# Patient Record
Sex: Female | Born: 1955 | Race: White | Hispanic: No | Marital: Married | Smoking: Never smoker
Health system: Southern US, Community
[De-identification: ages and names within clinical notes are randomized; demographics above are authoritative.]

## PROBLEM LIST (undated history)

## (undated) DIAGNOSIS — Q2112 Patent foramen ovale: Secondary | ICD-10-CM

## (undated) DIAGNOSIS — I6529 Occlusion and stenosis of unspecified carotid artery: Secondary | ICD-10-CM

## (undated) DIAGNOSIS — R600 Localized edema: Secondary | ICD-10-CM

## (undated) DIAGNOSIS — N289 Disorder of kidney and ureter, unspecified: Secondary | ICD-10-CM

## (undated) DIAGNOSIS — I272 Pulmonary hypertension, unspecified: Secondary | ICD-10-CM

## (undated) DIAGNOSIS — J189 Pneumonia, unspecified organism: Secondary | ICD-10-CM

## (undated) DIAGNOSIS — I071 Rheumatic tricuspid insufficiency: Secondary | ICD-10-CM

## (undated) DIAGNOSIS — R911 Solitary pulmonary nodule: Secondary | ICD-10-CM

## (undated) DIAGNOSIS — E785 Hyperlipidemia, unspecified: Secondary | ICD-10-CM

## (undated) DIAGNOSIS — I10 Essential (primary) hypertension: Secondary | ICD-10-CM

## (undated) DIAGNOSIS — I340 Nonrheumatic mitral (valve) insufficiency: Secondary | ICD-10-CM

## (undated) DIAGNOSIS — Z5189 Encounter for other specified aftercare: Secondary | ICD-10-CM

## (undated) DIAGNOSIS — I509 Heart failure, unspecified: Secondary | ICD-10-CM

## (undated) DIAGNOSIS — Q211 Atrial septal defect: Secondary | ICD-10-CM

## (undated) DIAGNOSIS — D649 Anemia, unspecified: Secondary | ICD-10-CM

## (undated) DIAGNOSIS — E869 Volume depletion, unspecified: Secondary | ICD-10-CM

## (undated) DIAGNOSIS — I2510 Atherosclerotic heart disease of native coronary artery without angina pectoris: Secondary | ICD-10-CM

## (undated) DIAGNOSIS — R011 Cardiac murmur, unspecified: Secondary | ICD-10-CM

## (undated) HISTORY — DX: Atrial septal defect: Q21.1

## (undated) HISTORY — DX: Pneumonia, unspecified organism: J18.9

## (undated) HISTORY — DX: Anemia, unspecified: D64.9

## (undated) HISTORY — DX: Solitary pulmonary nodule: R91.1

## (undated) HISTORY — DX: Pulmonary hypertension, unspecified: I27.20

## (undated) HISTORY — DX: Patent foramen ovale: Q21.12

## (undated) HISTORY — DX: Nonrheumatic mitral (valve) insufficiency: I34.0

## (undated) HISTORY — DX: Volume depletion, unspecified: E86.9

## (undated) HISTORY — DX: Occlusion and stenosis of unspecified carotid artery: I65.29

## (undated) HISTORY — DX: Atherosclerotic heart disease of native coronary artery without angina pectoris: I25.10

## (undated) HISTORY — DX: Hyperlipidemia, unspecified: E78.5

## (undated) HISTORY — DX: Localized edema: R60.0

## (undated) HISTORY — DX: Cardiac murmur, unspecified: R01.1

## (undated) HISTORY — DX: Rheumatic tricuspid insufficiency: I07.1

## (undated) HISTORY — DX: Essential (primary) hypertension: I10

## (undated) HISTORY — DX: Heart failure, unspecified: I50.9

## (undated) HISTORY — DX: Encounter for other specified aftercare: Z51.89

---

## 2006-01-08 ENCOUNTER — Encounter (INDEPENDENT_AMBULATORY_CARE_PROVIDER_SITE_OTHER): Payer: Self-pay | Admitting: Interventional Cardiology

## 2006-01-08 ENCOUNTER — Inpatient Hospital Stay (HOSPITAL_COMMUNITY): Admission: EM | Admit: 2006-01-08 | Discharge: 2006-01-10 | Payer: Self-pay | Admitting: Emergency Medicine

## 2006-01-17 ENCOUNTER — Ambulatory Visit: Payer: Self-pay | Admitting: Cardiology

## 2006-01-20 ENCOUNTER — Inpatient Hospital Stay (HOSPITAL_COMMUNITY): Admission: EM | Admit: 2006-01-20 | Discharge: 2006-01-23 | Payer: Self-pay | Admitting: Emergency Medicine

## 2006-01-31 ENCOUNTER — Inpatient Hospital Stay (HOSPITAL_COMMUNITY): Admission: EM | Admit: 2006-01-31 | Discharge: 2006-02-02 | Payer: Self-pay | Admitting: Emergency Medicine

## 2006-03-03 ENCOUNTER — Emergency Department (HOSPITAL_COMMUNITY): Admission: EM | Admit: 2006-03-03 | Discharge: 2006-03-03 | Payer: Self-pay | Admitting: Emergency Medicine

## 2006-03-06 DIAGNOSIS — I6529 Occlusion and stenosis of unspecified carotid artery: Secondary | ICD-10-CM

## 2006-03-06 HISTORY — DX: Occlusion and stenosis of unspecified carotid artery: I65.29

## 2006-03-06 HISTORY — PX: CARDIAC CATHETERIZATION: SHX172

## 2006-03-27 ENCOUNTER — Ambulatory Visit (HOSPITAL_COMMUNITY): Admission: RE | Admit: 2006-03-27 | Discharge: 2006-03-27 | Payer: Self-pay | Admitting: Gastroenterology

## 2006-05-10 ENCOUNTER — Encounter (HOSPITAL_COMMUNITY): Admission: RE | Admit: 2006-05-10 | Discharge: 2006-05-11 | Payer: Self-pay | Admitting: Gastroenterology

## 2006-05-13 ENCOUNTER — Emergency Department (HOSPITAL_COMMUNITY): Admission: EM | Admit: 2006-05-13 | Discharge: 2006-05-14 | Payer: Self-pay | Admitting: Emergency Medicine

## 2006-05-15 ENCOUNTER — Ambulatory Visit (HOSPITAL_COMMUNITY): Admission: RE | Admit: 2006-05-15 | Discharge: 2006-05-15 | Payer: Self-pay | Admitting: Gastroenterology

## 2006-05-15 ENCOUNTER — Encounter (INDEPENDENT_AMBULATORY_CARE_PROVIDER_SITE_OTHER): Payer: Self-pay | Admitting: Specialist

## 2006-05-17 ENCOUNTER — Emergency Department (HOSPITAL_COMMUNITY): Admission: EM | Admit: 2006-05-17 | Discharge: 2006-05-17 | Payer: Self-pay | Admitting: Emergency Medicine

## 2006-05-21 ENCOUNTER — Emergency Department (HOSPITAL_COMMUNITY): Admission: EM | Admit: 2006-05-21 | Discharge: 2006-05-21 | Payer: Self-pay | Admitting: Emergency Medicine

## 2006-05-30 ENCOUNTER — Ambulatory Visit: Payer: Self-pay | Admitting: Internal Medicine

## 2006-05-30 ENCOUNTER — Inpatient Hospital Stay (HOSPITAL_COMMUNITY): Admission: EM | Admit: 2006-05-30 | Discharge: 2006-06-01 | Payer: Self-pay | Admitting: Emergency Medicine

## 2006-05-31 ENCOUNTER — Encounter: Payer: Self-pay | Admitting: Cardiology

## 2006-06-14 ENCOUNTER — Ambulatory Visit: Payer: Self-pay | Admitting: Cardiology

## 2006-06-22 ENCOUNTER — Ambulatory Visit: Payer: Self-pay | Admitting: Cardiology

## 2006-07-20 ENCOUNTER — Emergency Department (HOSPITAL_COMMUNITY): Admission: EM | Admit: 2006-07-20 | Discharge: 2006-07-21 | Payer: Self-pay | Admitting: Emergency Medicine

## 2006-08-10 ENCOUNTER — Ambulatory Visit (HOSPITAL_COMMUNITY): Admission: RE | Admit: 2006-08-10 | Discharge: 2006-08-10 | Payer: Self-pay | Admitting: Surgery

## 2006-08-10 ENCOUNTER — Encounter (INDEPENDENT_AMBULATORY_CARE_PROVIDER_SITE_OTHER): Payer: Self-pay | Admitting: Surgery

## 2006-09-26 ENCOUNTER — Ambulatory Visit: Payer: Self-pay | Admitting: Internal Medicine

## 2006-12-24 ENCOUNTER — Ambulatory Visit: Payer: Self-pay | Admitting: Cardiology

## 2006-12-31 ENCOUNTER — Ambulatory Visit: Payer: Self-pay | Admitting: Cardiology

## 2007-01-29 ENCOUNTER — Ambulatory Visit: Payer: Self-pay | Admitting: Cardiology

## 2007-03-12 ENCOUNTER — Ambulatory Visit: Payer: Self-pay | Admitting: Cardiology

## 2007-09-02 ENCOUNTER — Ambulatory Visit: Payer: Self-pay | Admitting: Cardiology

## 2008-04-03 ENCOUNTER — Ambulatory Visit: Payer: Self-pay | Admitting: Cardiology

## 2008-09-03 ENCOUNTER — Ambulatory Visit: Payer: Self-pay | Admitting: *Deleted

## 2008-09-15 ENCOUNTER — Ambulatory Visit: Payer: Self-pay | Admitting: Vascular Surgery

## 2008-09-15 ENCOUNTER — Ambulatory Visit (HOSPITAL_COMMUNITY): Admission: RE | Admit: 2008-09-15 | Discharge: 2008-09-15 | Payer: Self-pay | Admitting: Vascular Surgery

## 2008-09-24 ENCOUNTER — Ambulatory Visit (HOSPITAL_COMMUNITY): Admission: RE | Admit: 2008-09-24 | Discharge: 2008-09-24 | Payer: Self-pay | Admitting: Surgery

## 2008-10-23 ENCOUNTER — Ambulatory Visit (HOSPITAL_COMMUNITY): Admission: RE | Admit: 2008-10-23 | Discharge: 2008-10-23 | Payer: Self-pay | Admitting: Vascular Surgery

## 2008-10-23 ENCOUNTER — Ambulatory Visit: Payer: Self-pay | Admitting: Vascular Surgery

## 2008-11-05 ENCOUNTER — Encounter: Payer: Self-pay | Admitting: Cardiology

## 2009-02-23 HISTORY — PX: KIDNEY TRANSPLANT: SHX239

## 2009-03-30 ENCOUNTER — Ambulatory Visit (HOSPITAL_COMMUNITY): Admission: RE | Admit: 2009-03-30 | Discharge: 2009-03-30 | Payer: Self-pay | Admitting: Nephrology

## 2009-10-12 ENCOUNTER — Ambulatory Visit (HOSPITAL_COMMUNITY)
Admission: RE | Admit: 2009-10-12 | Discharge: 2009-10-12 | Payer: Self-pay | Source: Home / Self Care | Admitting: Nephrology

## 2009-11-11 ENCOUNTER — Ambulatory Visit (HOSPITAL_COMMUNITY): Admission: RE | Admit: 2009-11-11 | Discharge: 2009-11-11 | Payer: Self-pay | Admitting: Nephrology

## 2010-06-12 LAB — GLUCOSE, CAPILLARY
Glucose-Capillary: 156 mg/dL — ABNORMAL HIGH (ref 70–99)
Glucose-Capillary: 172 mg/dL — ABNORMAL HIGH (ref 70–99)
Glucose-Capillary: 224 mg/dL — ABNORMAL HIGH (ref 70–99)

## 2010-06-12 LAB — POCT I-STAT 4, (NA,K, GLUC, HGB,HCT)
Hemoglobin: 12.9 g/dL (ref 12.0–15.0)
Sodium: 135 mEq/L (ref 135–145)
Sodium: 134 mEq/L — ABNORMAL LOW (ref 135–145)

## 2010-07-19 NOTE — Op Note (Signed)
[name], [name]                ACCOUNT NO.:  [id number]   MEDICAL RECORD NO.:  [id number]          PATIENT TYPE:  AMB   LOCATION:  SDS                          FACILITY:  [facility]   PHYSICIAN:  Imogene Burn. Georgette Dover, M.D. DATE OF BIRTH:  [birth year]   DATE OF PROCEDURE:  08/10/2006  DATE OF DISCHARGE:  08/10/2006                               OPERATIVE REPORT   PREOPERATIVE DIAGNOSIS:  Biliary dyskinesia.   POSTOPERATIVE DIAGNOSIS:  Biliary dyskinesia.   PROCEDURE PERFORMED:  Laparoscopic cholecystectomy with intraoperative  cholangiogram.   SURGEON:  Imogene Burn. Tsuei, M.D.   ANESTHESIA:  General endotracheal.   INDICATIONS:  The patient is a 55 year old female who has been  thoroughly evaluated by gastroenterology for a month history of  abdominal pain and nausea.  She has also had some weight loss.  Her pain  occurs after eating, especially greasy food.  Ultrasound was negative.  A HIDA scan showed a gallbladder ejection fraction at 18%.  She presents  for elective cholecystectomy in hopes of relieving her symptoms.   DESCRIPTION OF PROCEDURE:  The patient was brought to the operating room  and placed in the supine position on the operating table.  After an  adequate level of general anesthesia was obtained, the patient's abdomen  was prepped with Betadine and draped in a sterile fashion.  A time-out  was taken to assure the proper patient and proper procedure.  A  transverse incision was made below her umbilicus.  The decision was made  not to do a vertical incision deep into the umbilicus since there was  some necrotic debris which had been trapped in the umbilicus.  The  transverse incision was opened.  Dissection was carried down to the  fascia.  The fascia was grasped between clamps and opened.  A stay  suture of 0 Vicryl was placed around the fascial opening.  The Hasson  cannula was inserted and secured with a stay suture.  The  pneumoperitoneum is obtained by  insufflating CO2 maintaining a maximal  pressure of 15 mmHg.  The laparoscope was inserted and the patient was  positioned in reversed Trendelenburg position and tilted to her left.   There were omental adhesions to the surface of the gallbladder.  A 10 mm  port was placed in the subxiphoid position and two 5 mm ports were  placed in the right upper quadrant.  The gallbladder was grasped with a  clamp and elevated over the edge of the liver.  Cautery was used take  down the omental adhesions.  The peritoneum around the hilum of the  gallbladder was opened.  The cystic duct was circumferentially  dissected, ligated, and clipped distally.  An opening was created on the  cystic duct.  A Cook cholangiogram catheter was then inserted through a  stab incision and threaded into the cystic duct.  A cholangiogram was  obtained.  This showed good flow proximally and distally in the biliary  tree.  No sign of obstruction.  Contrast flowed easily through the  duodenum.  The cystic duct was very long and inserted into  the common  duct at some distance from the normal position.  The cystic duct was  then ligated with clips and divided.  The cystic artery was also ligated  clips and divided.  Cautery was used to remove the gallbladder from the  liver bed.  The gallbladder was placed in an EndoCatch sac.  The  gallbladder was removed umbilical port site.   The fascia was closed with the pursestring suture.  We irrigated the  right upper quadrant and checked for hemostasis.  The irrigation was  suctioned out.  The pneumoperitoneum was then released as trocars were  removed.  4-0 Monocryl was used to close the skin incisions.  Steri-Strips and  clean dressings were applied.  The patient was then extubated and  brought to the recovery room in stable condition.  All sponge,  instrument, and needle counts were correct.      Imogene Burn. Tsuei, M.D.  Electronically Signed     MKT/MEDQ  D:  08/10/2006   T:  08/10/2006  Job:  [id number]   cc:   Tory Emerald. Benson Norway, MD

## 2010-07-19 NOTE — Assessment & Plan Note (Signed)
[place] OFFICE NOTE   WILNA, STGELAIS                         MRN:          [id number]  DATE:09/02/2007                            DOB:          Aug 31, 1955    CARDIOLOGIST:  Ernestine Mcmurray, MD, [facility]   PRIMARY CARE PHYSICIAN:  Tammy R. Modena Morrow, MD   REASON FOR VISIT:  Four-month followup.   HISTORY OF PRESENT ILLNESS:  [name] is a 55 year old female patient  with a history of nonobstructive coronary artery disease by cardiac  catheterization in March 2008, who returns to the office today for  followup.  She has been followed quite closely by Mr. Mannie Stabile, PA-C,  over the last several months.  She was noted to have lower extremity  edema and a low albumin on lab work.  A 24-hour urine eventually  revealed greater than 4 grams of protein loss per day.  The patient was  referred to Dr. Lowanda Foster for potential nephrotic syndrome.  She returns  to [place] today for followup.  Overall, she is doing well.  She denies any  significant changes in shortness of breath.  She sleeps on 2 pillows.  There is no change.  She denies PND.  She denies any chest discomfort.  She denies syncope or near syncope or palpitations.  She notes that her  lower extremity edema has almost completely resolved.   She had recent blood work performed that revealed a BUN of 47 and a  creatinine of 1.9.  This was up from 11 and 1.3 respectively in January  2009.  She was asked to change her Demadex to every other day and to  follow up with BMET today.  This revealed stable BUN at 45 and stable  creatinine at 1.9.  Her potassium is within normal limits at 5.  Of  note, her recent blood work revealed a hemoglobin of 9.8, which was  stable.  She has a history of anemia of chronic disease.   CURRENT MEDICATIONS:  1. Iron 65 mg daily.  2. Demadex 10 mg every other day.  3. Lisinopril 20 mg daily.  4. Hyzaar 100/25 mg daily.  5. Lantus  insulin 20 units daily.  6. Lopressor 50 mg b.i.d.  7. Glimepiride 4 mg daily.  8. Sliding-scale insulin.  9. Aspirin 81 mg daily.  10.Simvastatin 40 mg daily.  11.Fluoxetine 20 mg daily.   PHYSICAL EXAMINATION:  GENERAL:  She is a well-nourished and well-  nourished female, in no distress.  VITAL SIGNS:  Blood pressure is 109/59, pulse is 53, and weight is 170.8  pounds.  HEENT:  Normal.  NECK:  Without JVD.  CARDIAC:  Normal S1 and S2.  Regular rate and rhythm.  I cannot  appreciate any murmurs.  LUNGS:  Clear to auscultation bilaterally.  ABDOMEN:  Nontender.  EXTREMITIES:  Without edema.  NEUROLOGIC:  She is alert and oriented x3.  Cranial II-XII grossly  intact.  VASCULAR:  Without carotid bruits bilaterally.   IMPRESSION:  1. Lower extremity edema with hypoalbuminemia and profound  proteinuria.      a.     Rule out nephrotic syndrome - followed by Dr. Lowanda Foster.  2. Renal deficiency.  3. Nonobstructive coronary artery disease by catheterization in March      2008.      a.     LAD proximal 40%, D1 25-40%, small obtuse marginal 90%,       second PDA branch 70% - medical therapy.  4. Preserved left ventricular function.  5. Moderate mitral regurgitation.  6. Moderate tricuspid regurgitation.  7. Moderate pulmonary hypertension.  8. Anemia of chronic disease.  9. Diabetes mellitus.  10.Dyslipidemia.  11.History of small patent foramen ovale.  12.History of pulmonary nodules, evaluated by Dr. Melvyn Novas.      a.     The patient is to have followup with chest x-ray per Dr.       Gustavus Bryant instructions.  13.History of nonobstructive cerebrovascular disease.      a.     Less than 50% bilateral internal carotid artery stenosis in       July 2008.   PLAN:  1. Ms. Bromberg is doing well from a cardiovascular standpoint.  No      further cardiovascular testing is warranted at this time.  She      should probably have another echocardiogram within the next 6-12      months.  She  should have followup carotid Dopplers in 2010.  2. She continues to follow with Dr. Lowanda Foster.  Of note, her creatinine      has gone up over the last several months.  We will send copies of      her labs to Dr. Lowanda Foster.  I have asked that she follow up with him      to further manage this.  I do not want to adjust any of her      medications that are being used to treat her potential nephrotic      syndrome.  3. She will have followup lipids, LFTs, and A BMET performed in 1      month's time.  4. We will set her up for a routine chest x-ray to assess stability of      her pulmonary nodules.  5. The patient will be brought back in followup in the next 6 months      or sooner p.r.n.      Richardson Dopp, PA-C  Electronically Signed      Ernestine Mcmurray, MD,FACC  Electronically Signed   SW/MedQ  DD: 09/02/2007  DT: 09/03/2007  Job #: [id number]   cc:   Tammy R. Modena Morrow, M.D.

## 2010-07-19 NOTE — Assessment & Plan Note (Signed)
[place] OFFICE NOTE   KANE, HARMISON                         MRN:          [id number]  DATE:12/24/2006                            DOB:          [birth year]    PRIMARY CARDIOLOGIST:  Dr. Terald Sleeper.   REASON FOR VISIT:  Scheduled 55-month followup.   Ms. Dougall reports no interim development of exertional angina pectoris  since last seen here in the clinic, by Dr. Dannielle Burn, in April.  She has  nonobstructive coronary artery disease by cardiac catheterization in  March of this year, essentially revealing small branch vessel disease in  the setting of known diabetes mellitus.  Left ventricular function is  normal and, of note, there was no evidence of mitral regurgitation by  cardiac catheterization.   Since her last visit, the patient has had followup with Dr. Christinia Gully  in [place], in followup of a CT scan of the chest this past June  which was notable for 2 small pulmonary nodules.  Dr. Melvyn Novas thought that  this was a very soft call, with the suggestion of 1 having grown  slightly in the preceding 6 months.  The patient has no history of  tobacco smoking, but did live with a smoker for 20 years.  Dr. Melvyn Novas  recommended continued monitoring with serial x-rays, rather than by CT  scans.  She is also scheduled to follow up with him in the next several  weeks.   Although the patient denies any frank PND or orthopnea, she complains of  new-onset lower extremity edema in both legs.  As noted, she has normal  left ventricular function and no evidence of right ventricular  dysfunction by previous echocardiograms.  An incidental small patent  foramen ovale was noted by echo in April.  There was no significant  valvular disease save for some trivial aortic regurgitation.   Review of most recent lab data, notable for hypoalbuminemia/proteinemia  by blood work in April.  She has normal renal function, and I do  not  have a urinalysis so as to exclude proteinuria.  TSH was normal in  March.   CURRENT MEDICATIONS:  1. Lopressor 50 b.i.d.  2. Glimepiride 4 daily.  3. Lantus 26 units daily.  4. Aspirin 81 daily.  5. Norvasc 5 daily.  6. Simvastatin 40 daily.  7. Fluoxetine 20 daily.  8. Lisinopril 10 daily.  9. Furosemide 40 daily.  10.Iron 65 daily.  11.Domperidone 10 q.i.d.   PHYSICAL EXAMINATION:  Blood pressure 125/60, pulse 65, regular, weight  173.   Electrocardiogram today reveals normal sinus rhythm at 63 beats per  minute with normal axis and chronic anteroseptal changes.   GENERAL:  A 55 year old female, pallid appearing, sitting upright and in  no distress.  HEENT:  Normocephalic, atraumatic.  NECK:  Palpable bilateral carotid pulses with bilateral bruits (right  greater than left), no JVD.  LUNGS:  Clear to auscultation all fields.  HEART:  Regular rate and rhythm (S1, S2), a 2/6 systolic ejection murmur  heard loudest at the base.  ABDOMEN:  Benign.  EXTREMITIES:  2-3+ bilateral, pitting edema.  NEURO:  No focal deficit.   IMPRESSION:  1. Bilateral lower extremity edema.      a.     Question new onset.      b.     Question secondary to hypo-oncotic pressure.      c.     Normal right ventricular function by 2D echo, March 2008.  2. Nonobstructive coronary artery disease.      a.     Small vessel disease by cardiac catheterization, March 2008.      b.     Normal left ventricular function.  3. Insulin-dependent diabetes mellitus, diabetic gastroparesis.  4. Pulmonary nodules, followed by Dr. Christinia Gully.  5. Dyslipidemia.  6. Anemia of chronic disease on supplemental iron.  7. Hypoalbuminemia.  8. Nonobstructive cerebrovascular disease.      a.     Carotid Dopplers, July 2008.      b.     Small patent foramen ovale.   PLAN:  1. Increase Lasix to 80 mg daily x1 week, then resume 40 mg daily for      short-term aggressive management of lower extremity edema.  2.  Check complete metabolic profile, BNP level and urinalysis today.      We will closely monitor electrolytes, renal function, and also      reassess for hypoalbuminemia and possible proteinuria, in light of      the patient's lower extremity edema and history of low albumin.  3. Repeat a BMET in 1 week for monitoring of electrolytes and renal      function after aggressive diuretic treatment for lower extremity      edema.  4. Repeat 2D echocardiogram for reassessment of left and right      ventricular function, and reassessment of valvular heart disease      given the finding of systolic murmur on physical examination.  5. Schedule return clinic followup with myself and Dr. Dannielle Burn in 1      month for review of study results and further recommendations.  6. The patient is instructed to contact Dr. Saunders Glance office so      as to arrange recommended clinic followup in the next several      weeks.      Gene Serpe, PA-C  Electronically Signed      Ernestine Mcmurray, MD,FACC  Electronically Signed   GS/MedQ  DD: 12/24/2006  DT: 12/25/2006  Job #: [id number]   cc:   Tammy R. Modena Morrow, M.D.

## 2010-07-19 NOTE — Assessment & Plan Note (Signed)
[place] OFFICE NOTE   [name], [name]                         MRN:          [id number]  DATE:03/12/2007                            DOB:          Dec 29, 1955    PRIMARY CARDIOLOGIST:  Dr. Terald Sleeper.   REASON FOR VISIT:  Scheduled followup.  Please refer to my previous  office note of November 25, for full details.   Since last seen in the clinic, I referred [name] to Dr. Lowanda Blackwell for  further evaluation of hypoalbuminemia and proteinuria. I noted that the  patient had had persistently low serum albumin levels by previous blood  work, as well as evidence of proteinuria by previous urinalysis.  I ,  thus, ordered a 24 hour urine and this yielded 4.8 grams of protein.  Given this finding, I referred the patient to Dr. Lowanda Blackwell, who saw the  patient earlier this morning.  He reportedly is repeating a 24-hour  urine and has also ordered a renal ultrasound.   Clinically, the patient reports no significant change since her last  office visit: her lower extremity edema has remained stable and her  weight is unchanged.  When last seen, I substituted her Lasix with  Demadex 10 mg daily, for more aggressive diuretic management.  Followup  blood work showed stable renal function.   The patient continues to report significant lower extremity edema and  only some mild exertional dyspnea on occasion.  She denies any frank  orthopnea or PND, but continues to sleep on 2 pillows.   The patient has normal ventricular function by previous studies,  including a heart catheterization in March 2008, and, more recently, a 2-  D echocardiogram this past October.  Of note, however, echocardiography  did yield moderate mitral regurgitation and moderate pulmonary  hypertension.   CURRENT MEDICATIONS:  1. Demadex 73.  2. Avelox of 14 mg daily as directed.  3. Iron 65 mg daily.  4. Domperidone 10 mg q.i.d.  5. Lopressor  50 mg b.i.d.  6. Glimepiride 4 mg daily.  7. Lantus 26 units nightly.  8. Sliding scale insulin.  9. Aspirin 81 mg daily.  10.Simvastatin 40 mg daily.  11.Fluoxetine 20 mg daily.  12.Lisinopril 20 mg daily.   PHYSICAL EXAMINATION:  Blood pressure 140/69, pulse 60, regular weight  177.8 (unchanged).   IMPRESSION:  1. Persistent lower extremity edema.      a.     Secondary to hypo-oncotic pressure secondary to       hypoalbuminemia.      b.     Rule out nephrotic syndrome.  2. Nonobstructive coronary artery disease.      a.     Cardiac catheterization, March 2008.  3. Preserved left ventricular function.  4. Valvular heart disease.      a.     Moderate mitral regurgitation, moderate tricuspid       regurgitation.  5. Moderate pulmonary hypertension.  6. Anemia of chronic disease.  7. Insulin-dependent diabetes mellitus.  8. Dyslipidemia.  9. Small, patent foramen ovale.  10.History of small pulmonary  nodule.      a.     Followed by Dr. Christinia Blackwell, in [place].  11.Nonobstructive cerebrovascular disease.   PLAN:  1. Continue current medication regimen.  2. No further cardiac workup indicated at this time, with the patient      undergoing current evaluation for possible nephrotic syndrome.  3. Schedule return clinic followup with myself and Dr. Dannielle Blackwell in 4      months.      Gene Serpe, PA-C  Electronically Signed      Ernestine Mcmurray, MD,FACC  Electronically Signed   GS/MedQ  DD: 03/12/2007  DT: 03/12/2007  Job #: [id number]   cc:   [name], M.D.  [name], M.D.

## 2010-07-19 NOTE — Op Note (Signed)
NAMEESLIE, EBBS                ACCOUNT NO.:  [id number]   MEDICAL RECORD NO.:  [id number]          PATIENT TYPE:  AMB   LOCATION:  SDS                          FACILITY:  [facility]   PHYSICIAN:  Rosetta Posner, M.D.    DATE OF BIRTH:  07-31-1955   DATE OF PROCEDURE:  09/15/2008  DATE OF DISCHARGE:  09/15/2008                               OPERATIVE REPORT   PREOPERATIVE DIAGNOSIS:  Chronic renal insufficiency.   POSTOPERATIVE DIAGNOSIS:  Chronic renal insufficiency.   PROCEDURE:  Right forearm loop arteriovenous Gore-Tex graft.   SURGEON:  Rosetta Posner, MD   ASSISTANT:  Jacinta Shoe, PA-C   ANESTHESIA:  MAC.   COMPLICATIONS:  None.   DISPOSITION:  Recovery room, stable.   PROCEDURE IN DETAIL:  The patient was taken to the operating room and  placed in supine position where the right arm was prepped and draped in  the usual sterile fashion.  Ultrasound imaging revealed a very small  cephalic vein, but a moderate-sized basilic vein at the level of the  elbow.  Using local anesthesia, an incision was made over the  antecubital space and carried down to isolate the brachial artery which  was of moderate size and the basilic vein was also good size.  Separate  incision was made over the distal forearm and a loop configuration  tunnel was created in the forearm.  A 6-mm standard wall stretch graft  was brought through the tunnel.  The basilic vein was occluded  proximally and distally with an 11 blade extending longitudinally using  Potts scissors.  A 4 dilator passed without resistance through the vein.  The graft spatulated and sewn end to side to the basilic vein with a  running 6-0 Prolene suture.  Clamps were removed.  The graft was flushed  with heparinized saline and reoccluded.  Next, the brachial artery was  occluded proximally and distally and was opened with 11 blade extending  longitudinally using Potts scissors.  A small arteriotomy was created.  The graft was  cut to the appropriate length and was sewn end to side to  the artery with a running 6-0 Prolene suture.  Clamps were removed and  good thrill was noted.  The wound was irrigated with saline.  Hemostasis  was obtained with electrocautery.  The wound was closed with 3-0 Vicryl  in the subcutaneous and subcuticular tissue.  Benzoin and Steri-Strips  were applied.      Rosetta Posner, M.D.  Electronically Signed     TFE/MEDQ  D:  09/15/2008  T:  09/16/2008  Job:  [id number]

## 2010-07-19 NOTE — Assessment & Plan Note (Signed)
[place] OFFICE NOTE   [name], [name]                       MRN:          [id number]  DATE:04/03/2008                            DOB:          [birth year]    REFERRING PHYSICIAN:  Tammy R. Modena Morrow, M.D.   HISTORY OF PRESENT ILLNESS:  The patient is a 55 year old female with  history of nonobstructive coronary arteries by catheterization in 2008.  The patient has a history now of nephrotic syndrome that is followed by  Dr. Lowanda Foster.  She was recently hospitalized due to marked dehydration.  The patient states at times she feels very nauseated.  She feels her  breathing now is okay.  She is very emotional at times because her  daughter is in jail for drug abuse.  She has frequent panic attacks.  She gets short of breath on minimal exertion.   She is also raising 2 grandchildren from her daughter.  The patient has  a history of moderate mitral regurgitation, moderate pulmonary  hypertension.  She has been evaluated for anemia with an EGD and  colonoscopy in 2009 and they were within normal limits.  She denies any  chest pain, orthopnea, or PND.  Her creatinine in the end of December  was 1.9.   MEDICATIONS:  1. Hyzaar 100/25 daily.  2. Metoprolol tartrate 50 mg p.o. b.i.d.  3. Lisinopril 20 mg p.o. q.a.m.  4. Torsemide 10 mg q.a.m.  5. Clonidine 0.2 mg p.o. b.i.d.  6. Fluoxetine daily.  7. Simvastatin 40 mg nightly.  8. Iron 65 mg 2 tablets q.a.m.  9. Aspirin 81 mg q.a.m.   PHYSICAL EXAMINATION:  VITAL SIGNS:  Blood pressure 152/69, heart rate  52, weight 776 pounds.  NECK:  Normal carotid stroke.  No carotid bruits.  LUNGS:  Clear breath sounds bilaterally.  HEART:  Regular rate and rhythm with a 2/6 holosystolic murmur.  ABDOMEN:  Soft, nontender.  EXTREMITIES:  No significant edema.   PROBLEM LIST:  1. Nephrotic syndrome.  Cause unknown.  Followed by Dr. Lowanda Foster.  2. Renal insufficiency  with creatinine of 1.9.  3. Nonobstructive coronary artery disease.  4. Preserved left ventricular function.  5. Moderate mitral regurgitation and moderate tricuspid regurgitation      as well as pulmonary hypertension.  6. Anemia of chronic disease.  7. Diabetes mellitus.  8. Dyslipidemia.  9. History of small patent foramen ovale.  10.History of pulmonary nodules evaluated by Dr. Melvyn Novas.   PLAN:  1. As the patient is markedly fatigued and she looks very pale, I have      ordered a CBC as well as a retic count.  I did not reorder ANA,      rheumatoid factor, C-ANCA which was done before by Dr. Lowanda Foster.  2. The patient did have a history of significant mitral regurgitation.      We will repeat an echo to follow up on this.  3. We will not make any changes in her medical therapy given the fact      that Dr. Lowanda Foster is following this closely.  I did order serum      immunofixation and a serum free light chain assay.      Ernestine Mcmurray, MD,FACC  Electronically Signed    GED/MedQ  DD: 04/09/2008  DT: 04/10/2008  Job #: [id number]   cc:   Tammy R. Modena Morrow, M.D.

## 2010-07-19 NOTE — Procedures (Signed)
CEPHALIC VEIN MAPPING   INDICATION:  Preop evaluation.   HISTORY:  Chronic renal failure.   EXAM:   The right cephalic vein was not adequately visualized.   The right basilic vein is compressible with diameter measurements  ranging from 0.13 to 0.31 cm.   The left cephalic vein was not adequately visualized.   The left basilic vein is compressible with diameter measurements ranging  from 0.11 to 0.33 cm.   IMPRESSION:  1. Patent bilateral basilic veins with diameter measurements as      described above.  2. Unable to adequately visualize the bilateral cephalic veins.   ___________________________________________  P. Drucie Opitz, M.D.   CH/MEDQ  D:  09/03/2008  T:  09/03/2008  Job:  [id number]

## 2010-07-19 NOTE — Assessment & Plan Note (Signed)
[place]                             PULMONARY OFFICE NOTE   NAME:[name], [name]                         MRN:          [id number]  DATE:09/26/2006                            DOB:          [birth year]    REASON FOR CONSULTATION:  Pulmonary nodule.   HISTORY:  This is a 55 year old white female, never smoker, diabetic,  with small vessel coronary artery disease and nonobstructive  cardiomyopathy with incidental finding of a multiple nodules in April of  2007 (not clear whether this was the CT scan or a plain film).  She was  seen and sent now because one of the nodules seen originally seen in  10/07 looks slightly larger, but is still only 9 x 6 mm, and is in the  lateral segment of the right middle lobe.   She denies any pleuritic pain.  She had unintended weight loss since  October associated with anorexia that has improved dramatically since  cholecystectomy in June of this year.  She denies, presently, any  dyspnea, orthopnea, PND, leg swelling, pleuritic pain or hemoptysis.   PAST MEDICAL HISTORY:  Significant for diabetes and hypertension.   ALLERGIES:  None known.   MEDICATIONS:  Recorded in detail on the worksheet, correct as listed.   SOCIAL HISTORY:  She is a never smoker, but did live with a smoker for  20 years.   FAMILY HISTORY:  Negative for respiratory disease or lung cancer.   REVIEW OF SYSTEMS:  Taken in detail and negative.   PHYSICAL EXAMINATION:  This is a 55 white female who tends to let  her husband answer questions for her.  She is afebrile with stable vital signs.  HEENT:  Unremarkable.  Oropharynx clear.  Lung fields are clear bilaterally to auscultation and percussion.  HEART:  Reveals a regular rate and rhythm without murmurs, gallops, or  rubs.  ABDOMEN:  Soft, benign.  EXTREMITIES:  Warm without calf tenderness, cyanosis, clubbing, or  edema.   CT scan was reviewed from September 04, 2006 with the above  findings.   IMPRESSION:  Multiple pulmonary nodules, one of which appears to have  grown slightly over the last 6 months.  This is a very soft call, and  these still are very tiny in a never smoker.  Since the nodules are  multiple in nature, my tendency is to follow them by plain film rather  than by CT scan.  If one of them can clearly be seen on plain film now  and could not be seen previously, I would proceed directly to an  excisional biopsy rather than go through PET scan or attempt a  percutaneous biopsy, and explained this to patient and her husband in  detail.   I have, therefore, arranged to see her back in 6 weeks and we will  establish serial followup in this office.     Christena Deem. Melvyn Novas, MD, [facility]  Electronically Signed    MBW/MedQ  DD: 09/26/2006  DT: 09/26/2006  Job #: [id number]   cc:   Ernestine Mcmurray, MD,FACC  Tammy R.  Modena Morrow, M.D.

## 2010-07-19 NOTE — Assessment & Plan Note (Signed)
[place] OFFICE NOTE   ZYKERRIA, GIBILISCO                         MRN:          [id number]  DATE:01/29/2007                            DOB:          [birth year]    PRIMARY CARDIOLOGIST:  Dr. Terald Sleeper.   REASON FOR VISIT:  Scheduled one month followup.  Please refer to my  office note of October 20, for full details.   At that time, I referred her for a repeat 2D echocardiogram for  reassessment of left ventricular function, given her new onset lower  extremity edema.  The echocardiogram suggested preserved left  ventricular function (EF 50% - 55%) with mild, concentric LVH.  Right  ventricular function was completely within normal limits.  There was  moderate mitral regurgitation, moderate tricuspid regurgitation, and  moderate pulmonary hypertension (RVSP 45-50).   I also recommended increasing Lasix from 40 to 80 daily x1 week for  aggressive management of lower extremity edema.  She informs me today  that this was not very effective, however.  We checked extensive blood  work, which revealed some mild prerenal azotemia with a stable  creatinine of 1.2, but a BUN of 39.  Potassium also was elevated at 5.6,  and I discontinued the supplemental potassium.  Followup potassium level  was 5.1.  Urinalysis was suggestive of possible UTI with large bacteria,  but negative nitrites and leukocyte esterase.  There was greater than  300 protein.  Urine culture was negative.  BNP level was drawn and was  425, compared to a previous level of 389 this past April.   Was notable was an albumin level of 2.9, which compared to 2.7 when last  checked in April of this year.   Clinically, Ms. Shutter continues to report no chest pain, PND but does  report wheezing if she lies flat on her back in bed.  She does sleep  on 2 pillows.  Her weight is also up 4 pounds since last visit.   CURRENT MEDICATIONS:  1. Norvasc 5  daily.  2. Aspirin 81 daily.  3. Lantus 26 units nightly.  4. Glimepiride 4 daily.  5. Lopressor 50 b.i.d.  6. Simvastatin 40 daily.  7. Fluoxetine 20 daily.  8. Lisinopril 10 daily.  9. Furosemide 40 daily.  10.Iron 65 daily.  11.Domperidone 10 mg q.i.d.   PHYSICAL EXAMINATION:  Blood pressure 140/60, pulse 57 and regular,  weight 177.2 (up 4).  GENERAL:  A 55 year old female sitting upright in no distress.  HEENT:  Normocephalic/atraumatic.  NECK:  Palpable bilateral carotid pulses with bilateral bruits (right  greater than left); no JVD.  LUNGS:  Clear to auscultation all fields.  HEART:  Regular rate and rhythm (S1-S2), a 99991111 holosystolic murmur  heard loudest at the base.  ABDOMEN:  Benign.  EXTREMITIES:  With 2 to 3+ bilateral, pitting edema.  NEURO:  Flat affect, but no focal deficit.   IMPRESSION:  1. Persistent lower extremity edema.      a.     Suspect secondary to hypo-oncotic pressure, secondary to  hypoalbuminemia.      b.     Normal left ventricular function, by current 2D echo.  2. Nonobstructive coronary artery disease.      a.     Cardiac catheterization, March 2008.  3. Anemia of chronic disease.  4. Small patent foramen ovale.  5. Insulin-dependent diabetes mellitus.  6. Small pulmonary nodule.      a.     Followed by Dr. Christinia Gully, in [place].  7. Dyslipidemia.  8. Nonobstructive cerebrovascular disease.   PLAN:  1. Repeat blood work with a complete metabolic profile and 123456      urine for total protein.  2. Discontinue her amlodipine, given the persistent lower extremity      edema.  3. Discontinue Lasix and start Demadex 10 mg daily, for more      aggressive diuretic therapy.  Check a followup BMET in one week.  4. Return clinic followup with myself and Dr. Dannielle Burn in 1 month, for      review of study results and further recommendations.      Gene Serpe, PA-C  Electronically Signed      Ernestine Mcmurray, MD,FACC   Electronically Signed   GS/MedQ  DD: 01/29/2007  DT: 01/29/2007  Job #: [id number]   cc:   Tammy R. Modena Morrow, M.D.

## 2010-07-19 NOTE — Consult Note (Signed)
VASCULAR SURGERY CONSULTATION   Sue, SANTELLANO Blackwell  DOB:  06-29-1955                                       09/03/2008  CHART#:[id number]   REFERRING PHYSICIAN:  Alison Murray, M.D.   REFERRAL DIAGNOSIS:  Chronic kidney disease.   HISTORY:  The patient is a 55 year old female with stage IV chronic  kidney disease.  Estimated GFR of 12 mL per minute.  Reaching end-stage.  Referred for placement of hemodialysis access.   PAST MEDICAL HISTORY:  1. Chronic kidney disease.  2. Hyperkalemia.  3. Metabolic acidosis.  4. Hypertension.  5. Type 1 diabetes.  6. Hyperphosphatemia.  7. Chronic anemia.  8. Nonobstructive coronary artery disease.   MEDICATIONS:  1. Metoprolol 100 mg b.i.d.  2. Amlodipine 5 mg daily.  3. Clonidine 0.2 mg b.i.d.  4. Metolazone 5 mg b.i.d.  5. Torsemide 20 mg daily.  6. Lisinopril 80 mg daily.  7. Simvastatin 40 mg daily.  8. Aspirin 81 mg daily.  9. Eliphos one with each meal.  10.Lantus 20 units q.h.s.   ALLERGIES:  None known.   FAMILY HISTORY:  Noncontributory.   SOCIAL HISTORY:  The patient is married with two grown children.  She is  unemployed.  Does not smoke or use alcohol.   REVIEW OF SYSTEMS:  Refer to patient encounter form, questions are  negative.   PHYSICAL EXAM:  General:  A 55 year old female in no acute distress.  Alert and oriented.  Vital signs:  BP is 131/76, pulse 58 per minute,  respirations 18 per minute.  Upper extremities:  Intact brachial, radial  and ulnar pulses bilaterally.  No edema.   INVESTIGATIONS:  Vein mapping reveals absent cephalic veins bilaterally.  Basilic veins are borderline in size, right measuring 0.30 at the  antecubital fossa.   IMPRESSION:  1. Chronic kidney disease reaching end-stage.  2. Hypertension.  3. Hyperkalemia.  4. Type 2 diabetes.  5. Hyperphosphatemia.   RECOMMENDATIONS:  The patient will be scheduled for placement of a right  forearm loop AV graft (she is  left-handed), at [facility].   Dorothea Glassman, M.D.  Electronically Signed  PGH/MEDQ  D:  09/03/2008  T:  09/04/2008  Job:  [id number]   cc:   Alison Murray, M.D.

## 2010-07-22 NOTE — Consult Note (Signed)
[name], [name] NO.:  [id number]   MEDICAL RECORD NO.:  [id number]          PATIENT TYPE:  INP   LOCATION:  3705                         FACILITY:  [place]   PHYSICIAN:  Jettie Booze, MDDATE OF BIRTH:  09/02/1955   DATE OF CONSULTATION:  01/10/2006  DATE OF DISCHARGE:                                 CONSULTATION   REFERRING PHYSICIAN:  Dr. Seward Carol.   REASON FOR VISIT:  Bilateral pleural effusions with cardiomegaly.   HISTORY OF PRESENT ILLNESS:  The patient is a 55 year old woman with no  known coronary artery disease who was admitted two days ago with left  flank pain.  She was found to have urinary tract infection.  She also  was found to have pleural effusions and significant hypertension.  Her x-  ray revealed cardiomegaly as well.  An echocardiogram showed a normal  ejection fraction but mild to moderate mitral regurgitation.  We were  asked to consult.  She reports two-pillow orthopnea over the past 5  days.  She has been in and out of the emergency room at [facility] because of her pain and has been receiving significant IV  fluids.  Her blood pressures here [facility] have ranged anywhere from  A999333 systolic 99991111 systolic.   PAST MEDICAL HISTORY:  1. Diabetes.  2. The patient was unaware that she had hypertension.   FAMILY HISTORY:  Significant for prior coronary artery disease.   MEDICATIONS:  1. Sliding scale insulin.  2. Altace 5 mg daily.  3. Lopressor 25 mg b.i.d.  4. Lasix 40 mg daily.  5. Potassium.  6. Cipro IV.  7. Reglan.  8. Lantus insulin.   ALLERGIES:  NO KNOWN DRUG ALLERGIES.   SOCIAL HISTORY:  The patient is married.  She does not smoke, drink or  use illegal drugs.   REVIEW OF SYSTEMS:  No chest pain, no lightheadedness, no syncope, no  nausea, vomiting, no loss of appetite.  She still has some flank pain.  No rash, no lower extremity edema.  All other systems negative.   PHYSICAL  EXAMINATION:  VITAL SIGNS:  Blood pressure is 178/88, pulse of  66.  GENERAL:  The patient is awake and alert in no apparent distress.  HEENT:  Head:  Normocephalic, atraumatic.  EEE:  Extraocular movements  intact.  NECK:  No carotid bruits.  CARDIOVASCULAR:  Regular rate and rhythm.  S1, S2.  A 2/6 holosystolic  murmur.  LUNGS:  Decreased breath sounds at both bases.  Upper lung fields were  clear.  ABDOMEN:  Soft, nontender.  EXTREMITIES:  Showed no edema.  NEURO:  No focal motor or sensory deficits.  SKIN:  No rash.  BACK:  No kyphoscoliosis.  PSYCH:  Normal mood and affect.   EKG shows a normal sinus rhythm, no pathologic Q-waves, no ST-segment  changes.   LABORATORY:  Troponin negative.  BNP of 202.  Creatinine 0.8.  Hemoglobin A1c of 12.4.  LDL of 122.  Hematocrit 34.2.   ASSESSMENT/PLAN:  A 55 year old with mild to moderate mitral  regurgitation who now has  fluid overload and uncontrolled hypertension.   PLAN:  1. The patient has been started on Lopressor, Lasix, Altace.  I would      recommend increasing her Altace to 10 mg daily and continue      Lopressor.  2. Continue diuresis with Lasix.  3. The patient's LDL is 122.  Given her diabetes, she should be on a      Statin to get her LDL below 100.  4. Recheck x-ray to assess pleural effusion.  5. Continue Cipro for UTI.  6. The patient will need close outpatient followup in regards to her      blood pressure.  At this point given the lack of symptoms, I do not      think an ischemic workup is necessary as an inpatient.  I would      have her follow up with me as an outpatient to see if she does      develop any symptoms worrisome for ischemia.      Jettie Booze, MD  Electronically Signed     JSV/MEDQ  D:  01/10/2006  T:  01/10/2006  Job:  [id number]

## 2010-07-22 NOTE — Discharge Summary (Signed)
[name], [name]                ACCOUNT NO.:  [id number]   MEDICAL RECORD NO.:  [id number]          PATIENT TYPE:  INP   LOCATION:  6524                         FACILITY:  [place] [facility]   PHYSICIAN:  Juanda Bond. Burt Knack, MD  DATE OF BIRTH:  Mar 14, 1955   DATE OF ADMISSION:  05/30/2006  DATE OF DISCHARGE:  06/01/2006                               DISCHARGE SUMMARY   CARDIOLOGIST:  Terald Sleeper, MD   PRIMARY CARE PHYSICIAN:  Florina Ou, MD   REASON FOR ADMISSION:  Chest pain in the setting of extreme  hypertension.   DISCHARGE DIAGNOSES:  1. Noncardiac chest pain.  2. Hypertensive heart disease.  3. Labile hypertension.  4. Chronic diastolic congestive heart failure.  5. Coronary artery disease.      a.     Mainly nonobstructive - medical therapy.  6. Dyslipidemia.      a.     Zocor initiated this admission.  7. Good left ventricular function with a rate of 60-65%.  8. Rule out right atrial mass.      a.     Echocardiogram this admission with area of shadowing in the       right atrium - could represent calcified portion of the atrial       wall but cannot exclude mass or thrombus.      b.     Outpatient transesophageal echocardiogram to be arranged.  9. Diabetes mellitus, Type 2, insulin-dependent.  10.Gastroparesis.  11.History of pulmonary nodules.      a.     The patient noted to have followup by serial chest CT scan -       will also follow up.  12.History of pancreatitis, 2007.  13.History of anemia.  14.History of melena.      a.     Status post polypectomy, March 2008.  15.Depression.  16.Left carotid bruit noted on initial physical exam on admission.   PROCEDURES PERFORMED THIS ADMISSION:  Cardiac catheterization by Dr.  Sherren Mocha, May 31, 2006.  Please see dictated note for complete  details.  Briefly, left anterior descending 40% proximal, first diagonal  diffused 25-40% stenoses, left circumflex with a very small obtuse  marginal branch with 90% stenosis -  approximately 1-mm vessel, right  coronary artery with a second posterior descending artery and a twin  pulmonary artery system with 30% stenosis.  Left ventricular ejection  fraction of 60%, no mitral regurgitation.   HISTORY:  [name] is a 55 year old female patient, followed up by Dr.  Dannielle Burn, who has a history of chronic diastolic congestive heart failure  and hypertensive heart disease as well as abnormal chest CT in the past,  revealing two small 8-mm nodules.  She was to have followup CT scanning  to monitor this.  She has followed up in [place] with Dr. Dannielle Burn once, but  then has been lost to followup.  She presented to the hospital on the  day of admission with complaints of chest pain.  She had been referred  from her family care physician's office.  Her pressure was as high as  202/125.  She was transported via EMS.  She was admitted to [facility] for further evaluation and treatment.   HOSPITAL COURSE:  The patient was evaluated by Dr. Haroldine Laws and  admitted for further evaluation and treatment.  There was a note of  abdominal tenderness and a history of pancreatitis.  She was admitted  and serial cardiac enzymes were checked to rule out myocardial  infarction.  There was some concern for pancreatitis and liver enzymes  and amylase and lipase were also checked.  She was set up for 2D  echocardiogram as well.  The patient ruled out for myocardial infarction  by enzymes.  Her LFTs and amylase and lipase were also within normal  limits.  2D echocardiogram as noted above revealed normal LV function.  She also had a question of a mass in the right atrium.  Her admission  chest x-ray showed no acute findings.  Abdominal ultrasound showed a  small amount of free fluid.  The abdomen was otherwise normal.  It was  decided to proceed with cardiac catheterization to better define the  patient's symptoms.  Cardiac catheterization was done by Dr. Sherren Mocha, as noted  above.  She had some severe, small transvessel disease.  Dr. Burt Knack felt this could be treated medically, and he did not feel  that this was a major cause of her chest pain.  Her blood pressure  medicines were adjusted to better control her blood pressure.  She also  was placed on Statin therapy to treat her dyslipidemia give her proven  coronary disease.  Her goal LDL will be less than 70.  On the morning of  discharge.  Dr. Burt Knack evaluated the patient, felt she was stable enough  for discharge to home.  He noted her right atrial questionable mass and  felt that she should be set up for a transesophageal echocardiogram as  an outpatient in [place] with followup at our office there.   LABORATORY AND ANCILLARY DATA:  2D echocardiogram as noted above.  Chest  x-ray as noted above.  White count of 7800, hemoglobin 11.4, hematocrit  33.2, MCV of 88.3, platelet count 211,000.  INR 1.2, sodium 140,  potassium 3.6, glucose 122, BUN 7, creatinine 0.62, calcium 8.7.  Total  bilirubin 0.6, alkaline phosphatase 56, AST 16, ALT 13, total protein 5,  albumin 2.5.  Hemoglobin A1c 6.4.  Cardiac markers as noted above,  negative x3.  BNP on admission 170.  Total cholesterol 159,  triglycerides 46, HDL 40, LDL 110, TSH 2.216.   DISCHARGE MEDICATIONS:  1. Zocor 40 mg q.h.s.  2. Lisinopril 5 mg daily.  3. Metoprolol 50 mg b.i.d.  4. Norvasc 5 mg daily.  5. Lasix 40 mg daily.  6. Glimepiride 4 mg daily.  7. Humalog sliding scale insulin as taken previously.  8. Fluoxetine 20 mg daily.  9. Metoclopramide 10 mg 3 times a day.  10.Tandem iron capsules daily as directed.  11.Lantus 20 units q.h.s.  12.Aspirin 81 mg daily.  13.Percocet p.r.n.  14.Promethazine p.r.n.  15.Nitroglycerin p.r.n. chest pain.   ACTIVITY:  She is to increase her activity slowly.  She may walk up  steps.  She can walk with assistance.  No heavy lifting or driving or  sexual activity for 3 days.  DIET:  Low-fat, low-sodium  diabetic diet.   The patient is to call our office in [place] for any groin swelling,  bleeding, bruising, or fever.   FOLLOWUP:  1. The patient will  be contacted soon for an appointment to present to      [facility] for a transesophageal echocardiogram to further      evaluate her right atrium.  As noted above, there is a question of      a right atrial mass.  She will then be set up for a followup in our      office in [place] after her transesophageal echocardiogram.  2. She should follow up with Dr. Modena Morrow as directed.  3. At followup, we will need to make sure that she has had followup CT      scan to follow up on her history of pulmonary nodules.  4. At her followup appointment, we will also reexamine her carotid      artery.  If carotid Dopplers are indicated, we will proceed with      setting her up for that as an outpatient.   TOTAL PHYSICIAN/PA TIME ON THIS DISCHARGE:  Greater than 60 minutes.      Richardson Dopp, PA-C      Juanda Bond. Burt Knack, MD  Electronically Signed    SW/MEDQ  D:  06/01/2006  T:  06/01/2006  Job:  [id number]   cc:   Tammy R. Modena Morrow, M.D.

## 2010-07-22 NOTE — Assessment & Plan Note (Signed)
[facility] OFFICE NOTE   NAME:GOINSElcie, Sitler                         MRN:          [id number]  DATE:01/16/2006                            DOB:          1955-06-27    NEW PATIENT OFFICE VISIT:   HISTORY OF PRESENT ILLNESS:  Patient is a 55 year old female recently  admitted to [facility] with bilateral pleural effusions and  presumed diastolic congestive heart failure secondary to hypertensive heart  disease.  The patient actually presented to [facility] with  abdominal pain and left flank pain.  Incidentally, she was found to have  cardiomegaly and significant bilateral pleural effusions.  She also reported  lower extremity edema in the two weeks prior to her admission.  She was seen  by cardiology consult team.  It was felt that the patient had diastolic  dysfunction with a normal echocardiographic study, in regards to ejection  fraction.  She had mild-to-moderate mitral regurgitation.  She was placed on  antihypertensive therapy and diuresed.  Bilateral pleural effusions  significantly improved, and I reviewed the patient's chest x-ray and upon  admission, effusions were not completely resolved and were significantly  less.  The patient had no evidence of ischemia by EKG.  She ruled out for  myocardial infarction, and it was felt that she did not need further  ischemic workup.  She also does not have a prior history of coronary artery  disease.  She is now being referred to our office for further followup of  her blood pressure management and particularly her management of  hypertensive heart disease with diastolic heart failure.   Interestingly, however, the patient's findings as discussed above, were  found incidentally during admission at [facility], and she  presented with abdominal pain and left flank pain.  She was felt to have a  urinary tract infection; however, she  does report a longstanding history  over the last six months to one year of disabling abdominal and flank pain.  She states that the pain is intermittent.  It is not postprandial and is not  related to position.  The patient states the pain feels typically sharp and  left-sided and is associated with nausea and occasional vomiting.  The  patient's potassium was low on admission to [facility].  She  underwent a CT of the abdomen and pelvis and no definite malignancies were  found.  She did have two small 8 mm nodules in the lungs and a follow-up CT  scan was recommended.  The patient was treated for a urinary tract infection  but no obvious other causes for abdominal pain were found.  Of note is also  that this patient did not have a GYN exam in the last five years.  She also  has not seen a gastroenterologist in the interim.  She also reports a  dramatic 40 pound weight loss over the last 6-8 months.  She reports early  satiety and moderately decreased appetite.  She denies any exertional chest  pain or shortness of breath.  She has no palpitations or syncope.   ALLERGIES:  No known drug allergies.   MEDICATIONS:  1. Altace 5 mg a day.  2. Lasix 40 mg a day.  3. Lopressor 50 mg p.o. b.i.d.  4. Clonidine 0.1 b.i.d.  5. Glimepiride 4 mg p.o. daily.  6. Reglan 5 mg p.o. q.i.d.  7. Lantus insulin 26 units nightly, sliding-scale insulin.   SOCIAL HISTORY:  The patient lives in [place].  She is married.  She does  not smoke or drink.   FAMILY HISTORY:  Significant for coronary artery disease.   PAST MEDICAL HISTORY:  See problem list below.   REVIEW OF SYSTEMS:  Positive for weakness and fatigue.  Positive for  abdominal pain, nausea, and sweating.  Positive for heartburn.  Patient  reports abdominal cramps.  Positive for weakness and tremors.  No frequency  or dysuria.  No constipation, melena, or hematochezia.  No bruising or  angioedema.   PHYSICAL EXAMINATION:  VITAL  SIGNS:  Blood pressure 174/82, heart rate 55  beats per minute.  GENERAL:  A well-nourished but pale-appearing white female in no apparent  distress.  HEENT:  Pupils isochoric.  Conjunctivae clear.  Oropharynx within normal  limits.  NECK:  Normal carotid upstrokes.  No carotid bruits.  JVD is within normal  limits and estimated at 6 cm.  There is a normal carotid upstroke.  LUNGS:  Clear breath sounds bilaterally with no wheezing or crackles.  HEART:  Regular rate and rhythm with normal S1 and S2.  No S3.  There is no  S4.  There are no pathological murmurs.  ABDOMEN:  Not distended.  There is tenderness on palpation in the left lower  quadrant and the left flank.  There is no rebound or guarding.  EXTREMITIES:  No clubbing or cyanosis.  There is 2+ peripheral pitting  edema.  NEUROLOGIC:  The patient is alert and oriented.  Grossly nonfocal.  Peripheral pulses are palpable.  Dorsalis pedis and posterior tibial pulses  2+.   A 12-lead electrocardiogram, normal sinus rhythm, nonspecific T wave  changes.  No definite LVH by EKG.   PROBLEM LIST:  1. Status post acute diastolic heart failure.      a.     Normal ejection fraction at 55%.      b.     Bilateral pleural effusions.      c.     Hypertensive heart disease with uncontrolled blood pressure.  2. Severe hypertension.  3. Diabetes mellitus.  4. Abdominal pain, e causa ignota.      a.     Negative CT of the abdomen and pelvis.      b.     No gynecological examination in the last five years.      c.     Rule out intra-abdominal pathology related to bilateral pleural       effusions.  5. Mild-to-moderate mitral regurgitation.  6. Pulmonary nodules, followup required in six months.  7. Dramatic weight loss (40 pounds).   PLAN:  1. The patient presented to [facility] for abdominal pain.      Incidentally, she was found to have diastolic congestive heart failure     with hypertensive heart disease.  2. I am not certain  there is a relationship between the patient's      bilateral effusions and acute diastolic heart failure in regards to her      abdominal pain.  CT scan of the abdomen and  pelvis shows no malignancy      and no evidence of an adrenal adenoma.  3. At this point, I will focus my attention on lowering the patient's      blood pressure and have increased Altace to 10 mg p.o. daily.  I have      also increased the patient's Lasix to twice a day for seven days and      then return back to 40 a day, given her ongoing lower extremity edema.  4. I agree with our consultant team that I do not think ischemia testing      is necessarily required at this point in time.  I do suspect the      patient has hypertensive heart disease with significant diastolic      dysfunction.  Further workup regarding the patient's abdominal pain is      required.  No obvious gross pathology by CT scan of the abdomen and      pelvis but have scheduled the patient for referral to Dr. Lavone Neri.  Not      sure if the patient requires a colonoscopy or whether her pain could be      related to neuropathy secondary to underlying diabetes mellitus.  5. I have also scheduled the patient for a GYN exam, as this has not been      done in the last five years.  6. Patient will follow up with me in the next couple of weeks.  We will      repeat her chest x-ray to document the resolution of her bilateral      effusions.  7. Patient will need to follow up in six months with a CT regarding her      pulmonary nodules.  8. Further evaluation by Dr. Lavone Neri is indicated to address the issue of      weight loss.     Ernestine Mcmurray, MD,FACC  Electronically Signed    GED/MedQ  DD: 01/17/2006  DT: 01/17/2006  Job #: [id number]   cc:   Chipper Herb, M.D.

## 2010-07-22 NOTE — Cardiovascular Report (Signed)
[name], [name]                ACCOUNT NO.:  [id number]   MEDICAL RECORD NO.:  [id number]          PATIENT TYPE:  INP   LOCATION:  6524                         FACILITY:  [facility]   PHYSICIAN:  Juanda Bond. Burt Knack, MD  DATE OF BIRTH:  Feb 16, 1956   DATE OF PROCEDURE:  05/31/2006  DATE OF DISCHARGE:                            CARDIAC CATHETERIZATION   PROCEDURE:  Left heart catheterization, selective coronary angiography,  left ventricular angiography.   INDICATIONS:  [name] is a 55 year old woman who presented with chest  pain.  She has multiple cardiovascular risk factors with chest pain that  was responsive to IV nitroglycerin and intravenous heparin.  She was  treated for unstable angina and referred for cardiac catheterization.  Her objective evidence of ischemia has been negative to date.   The risks and indications of procedure were explained to the patient.  Informed consent was obtained.  The right groin was prepped, draped and  anesthetized with 1% lidocaine.  A 6-French sheath was placed in the  right femoral artery via a front wall stick.  Multiple views of the left  and right coronary arteries were taken with standard Judkins' catheters.  Following selective coronary angiography, an angled pigtail catheter was  inserted into the left ventricle, and pressures were recorded.  Left  ventriculogram was performed.  A pullback across the aortic valve was  done.   FINDINGS:  Aortic pressure 155/71 with a mean of 105, left ventricular  pressure 155/3 with an end-diastolic pressure of 11.   Left mainstem is angiographically normal.  It trifurcates into the LAD,  left circumflex and intermediate branch.   The LAD is a large-caliber vessel that courses down to the LV apex.  The  proximal portion of the LAD has a long tubular 40% stenosis.  The mid  and distal LAD are angiographically normal.  There is a first diagonal  branch that has diffuse 25-40% stenosis.  There is a  distal diagonal  branch that is fairly large and has no significant obstructive disease.   The ramus intermedius branch is large-caliber and has no significant  angiographic disease.   The left circumflex is small.  It provides a proximal atrial branch and  courses down to give off a very small OM branch that has a 90% stenosis.  This is approximately a 1-mm vessel.   The right coronary artery is large.  It is a dominant vessel.  The  proximal mid and distal portions of the right coronary artery are smooth  and have no significant angiographic disease.  The right coronary artery  gives off multiple distal branches.  There is a twin PDA system.  The  second PDA branch has a 70% stenosis.  There are three large  posterolateral branches that have no significant angiographic disease.   Left ventriculography demonstrates normal left ventricular systolic  function with an estimated LVEF of 60%.  There is no mitral  regurgitation.   ASSESSMENT:  1. Non-obstructive proximal coronary artery disease.  2. Branch vessel disease involving the diagonal branches of the LAD, a  small OM branch of the left circumflex, and a PD branch of the      right coronary artery.  3. Normal left ventricular function.   PLAN:  I think Ms. Ings is best served by medical therapy for her  coronary artery disease.  She has significant stenosis involving small  branch vessels, but it is unclear which of these could be the possible  culprit for her chest pain.  I will make any of these bear much  prognostic importance.  I think she should do well with medical  treatment of her coronary artery disease.      Juanda Bond. Burt Knack, MD  Electronically Signed     MDC/MEDQ  D:  05/31/2006  T:  05/31/2006  Job:  [id number]   cc:   Ernestine Mcmurray, MD,FACC

## 2010-07-22 NOTE — Discharge Summary (Signed)
NAMEKENADIE, SLOAS NO.:  [id number]   MEDICAL RECORD NO.:  [id number]          PATIENT TYPE:  INP   LOCATION:  5703                         FACILITY:  [facility]   PHYSICIAN:  Carey Bullocks. Gertie Exon, MD     DATE OF BIRTH:  1955-07-05   DATE OF ADMISSION:  01/30/2006  DATE OF DISCHARGE:  02/02/2006                               DISCHARGE SUMMARY   DISCHARGE DIAGNOSES:  1. Diabetic gastroparesis.  2. Nausea, vomiting, and abdominal pain.   SUMMARY OF HOSPITALIZATION:  Ms. Diamond is a 55 year old female who  presented to the [facility] Emergency Department on January 30, 2006  with a chief complaint of abdominal pain, nausea and vomiting.  She had  recently been hospitalized about one week previously with similar  complaints and was found to have acute pancreatitis; however, during  this admission her lipase was 11.   Dictation ended at this point.      Carey Bullocks. Gertie Exon, MD  Electronically Signed     PML/MEDQ  D:  02/02/2006  T:  02/03/2006  Job:  [id number]

## 2010-07-22 NOTE — H&P (Signed)
[name], [name] NO.:  [id number]   MEDICAL RECORD NO.:  [id number]          PATIENT TYPE:  EMS   LOCATION:  MAJO                         FACILITY:  [facility]   PHYSICIAN:  Benito Mccreedy, M.D.DATE OF BIRTH:  1955/11/07   DATE OF ADMISSION:  01/08/2006  DATE OF DISCHARGE:                                HISTORY & PHYSICAL   PROBLEM LIST:  1. Uncontrolled hypertension.  2. Diabetes mellitus.  3. Bilateral pleural effusion.  4. Atelectasis.  5. Severe hypokalemia.  6. Flank pain.   CHIEF COMPLAINT:  Left-sided pain.   HISTORY OF PRESENT ILLNESS:  The patient is a 55 year old lady who presented  to the ED with left-sided flank pain.  According to the patient she had been  seen at the ED, about 3 weeks ago for the same and workup was unrevealing.  She also, recently was evaluated 3 or 4 days ago and was started on  antibiotic for UTI.  She came into the ED at [facility] because of the  persistence of her symptoms with nausea and vomiting.  She has not had any  fever or chills.  She has not had any chest pain or shortness of breath.  She has not had any cough between production. No PND or orthopnea.  No  abdominal pain, diarrhea, or constipation.  No dysuria, frequency in  micturition.  No arthritis or myalgias. No headaches, visual changes or  weakness.   In the ER the patient was evaluated by Dr. Rogene Houston of Emergency Medicine.  Blood work was done.  X-ray is documented to have shown CHF about pleural  effusion; however, on my review x-ray actually did not show that, but indeed  it showed lots of pleural effusion with atelectasis.  The patient was also  evaluated with a CT scan of the abdomen and pelvis and chest which is said  to have shown CHF about pleural effusion.  Followup CT scan of the abdomen  and pelvis indicated lots of pleural effusion with atelectasis .  She was  noted to have severe hypokalemia of 2.9.  potassium supplementation was  initiated; and the patient is admitted for further evaluation and  management.   PAST MEDICAL HISTORY:  The patient gave a history of diabetes mellitus.  Denies any prior history of hypertension or heart disease.   FAMILY HISTORY:  Positive for heart disease.   SOCIAL HISTORY:  The patient does not smoke cigarettes or drink alcohol.   MEDICATION ALLERGIES:  She has no known medical allergies.   CURRENT MEDICATIONS:  Augmentin, Cipro, Vicodin, Naproxen, and Tramadol.   PHYSICAL EXAMINATION:  VITAL SIGNS:  BP 169/100, , respirations 20, pulse  oximetry 97% on room air.  She was not in acute cardiopulmonary distress.  HEENT:  Normocephalic, atraumatic.  Pupils were equal round, reacted to  light.  The EOMI  was intact.  Oropharynx was moist.  NECK:  Supple, no JVD.  LUNGS:  Clear to auscultation except for decreased breath sounds at the  bases.  CARDIAC:  Regular no gallops.  ABDOMEN:  Soft, nontender, no consistent left costovertebral  angle  tenderness was appreciated.  Bowel sounds were present.  EXTREMITIES:  No cyanosis.  Patient had trace edema.  CENTRAL NERVOUS SYSTEM:  Nonfocal.   LABS:  CBC scan and x-ray as noted above.  EKG shows sinus rhythm.  No acute  ST wave changes.  There was abnormal prolongation of acute interval.  CBC  was within normal limits except for hematocrit of 34.2, Comparative  metabolic panel was within normal limits except for a potassium of 2.9,  glucose of 266.  Calcium 8.3, total protein 5.3.  Albumin 2.7 with normal  transaminases.  Lipase was 17.  Point of care cardiac markers were negative.  BNP was 202.   IMPRESSION:  The patient is going to be admitted for management of nausea,  vomiting with repletion of her hypokalemia.  It is not clear whether this  was would present incomplete urinary tract infection.  She is to be  continued on antibiotics with Cipro.  Will get cultures of urine and blood.  Will check a TSH and 2-D echocardiogram to  further evaluate the heart.  In  terms of heart failure, will start her on some Lasix for her peripheral  edema and also followup on pulmonary abnormality  on CT scan with a repeat  CT.  Will repeat her potassium level.  Will give her symptomatic treatment  for her lung pain.  The cause of the lung pain is unclear at this time.      Benito Mccreedy, M.D.  Electronically Signed     GO/MEDQ  D:  01/08/2006  T:  01/08/2006  Job:  [id number]

## 2010-07-22 NOTE — H&P (Signed)
[name], [name]                ACCOUNT NO.:  [id number]   MEDICAL RECORD NO.:  [id number]          PATIENT TYPE:  INP   LOCATION:  56                         FACILITY:  [facility]   PHYSICIAN:  Melissa L. Lovena Le, MD  DATE OF BIRTH:  1955-06-01   DATE OF ADMISSION:  01/20/2006  DATE OF DISCHARGE:                              HISTORY & PHYSICAL   CHIEF COMPLAINT:  Abdominal pain.   PRIMARY CARE PHYSICIAN:  [facility].   HISTORY OF PRESENT ILLNESS:  The patient is a 55 year old white female  who states 2 months ago she developed a tender abdomen that was painful,  but in a diffuse nature.  The patient states that she was seen by her  primary care physician and diagnosed with an unknown illness which  required her to be started on antibiotics and a stomach medicine.  The  patient states that associated with the symptoms she was having nausea,  chills, back then occasional vomiting, but currently none.  Currently,  she states she has 10/10 abdominal discomfort that moves right to left.  She has had no fevers of which she is aware.   REVIEW OF SYSTEMS:  Weight loss over the last 1 year; she could not  quantify, but did state that she had lost weight.  Her appetite has been  okay, until recently.  She denies chest pain.  No dysuria.  No vomiting.  She sleeps on about 3 pillows, which has not changed.  All other review  of systems are negative.   PAST MEDICAL HISTORY:  1. Diabetes.  2. Hypertension.  3. Congestive heart failure.  She is seen by Dr. Dannielle Blackwell up at      [facility].  4. Her last GYN appointment for evaluation was 8 years ago.  Her last      menstrual cycle was in September.   PAST SURGICAL HISTORY:  Tubal ligation.   SOCIAL HISTORY:  No tobacco, no ethanol, not employed.   FAMILY HISTORY:  Mom is deceased of CAD, diabetes, hypertension and  kidney disease.  Dad is deceased of CAD, diabetes, strokes and MI.   ALLERGIES:  No known drug  allergies.   MEDICATIONS:  1. Lantus 26 units subcu daily.  2. Humalog sliding-scale insulin.  3. Metoprolol 50 mg b.i.d.  4. Lasix 40 mg daily.  5. Altace 5 mg daily.  6. Clonidine 0.1 mg p.o. b.i.d.  7. Metoclopramide 5 mg q.i.d.  8. Glimepiride 4 mg daily.   PHYSICAL EXAMINATION:  VITAL SIGNS:  Temperature is 97.4, blood pressure  146/70, pulse 59, respirations 18, saturation 96%.  GENERAL:  This is a pleasant white female in no acute distress, looking  older than her stated age.  HEENT:  She is normocephalic, atraumatic.  Pupils are equal, round and  reactive to light.  Extraocular muscles are intact.  Mucous membranes  are moist.  NECK:  Supple.  There is no JVD, no lymph nodes, no carotid bruits.  CHEST:  Decreased, but clear to auscultation.  There are no rhonchi,  rales or wheezes.  CARDIOVASCULAR:  Regular rate and  rhythm.  Positive S1 and S2.  No S3 or  S4.  No murmurs, rubs, or gallops.  ABDOMEN:  Soft, but diffusely tender.  She has no rebound or guarding.  There are occasional positive bowel sounds.  EXTREMITIES:  No clubbing, cyanosis or edema.  NEUROLOGIC:  She is awake, alert and oriented.  Cranial nerves II-XII  are intact.  Power is 5/5.  DTRs are 2+.  Plantars are downgoing.   LABORATORY AND ACCESSORY CLINICAL DATA:  The CT scan from January 08, 2006 was reviewed and shows bilateral effusions, two 8-mm nodules in the  right middle lobe requiring 45-month followup, and bilateral perinephric  stranding without evidence for stone or acute pyelonephritis.   Urinalysis showed 0-2 wbc's.  Lipase was 76.  Albumin was 2.6, AST and  ALT were within normal limits.  Her sodium was 136, potassium 2.8,  chloride 103, CO2 is 28, BUN is 13, creatinine is 0.8 and her glucose is  296.  White count is 6.9, hemoglobin is 7.6, hematocrit 30.5 and  platelets are 216,000.  Calcium is 8.0, but corrects within normal  limits when looking at the albumin of 2.6.  Her last ejection  fraction  was 55% to 60%.   Chest x-ray shows some improvement in her effusions.   ASSESSMENT AND PLAN:  This is a 55 year old white female recently  discharged from [facility] with similar symptoms of abdominal  pain, nausea and vomiting.  In the emergency room, the patient was found  to have increased lipase consistent with pancreatitis of unclear  etiology.   1. Cardiovascular:  History of hypertension and congestive heart      failure.  Improved pleural effusions are noted on her x-ray after      being placed on Lasix.  We will continue her current medications,      hold her Lasix for now and replete her potassium.  2. Pulmonary:  Two 8-mm nodules on the CT scan will need to be      followed up in 6 months.  At present, the effusions are improved      since last evaluating.  3. Gastrointestinal:  Pancreatitis of unclear etiology.  NPO status      will be maintained at this time.  4. Genitourinary:  She states she has had no menses since September,      but she does have a tubal ligation.  She has not had a Pap smear in      8 years.  We will probably consider ultrasound, both trans-      abdominal and transvaginal to rule out a possible occult      malignancy.  5. Endocrine:  Poorly controlled diabetes.  We will continue her on      nothing by mouth.  I will start her back on      Lantus while she is nothing-by-mouth at 10 mg subcutaneously and      insulin sliding scale.  We will also hold her oral agents.  6. Deep venous thrombosis prophylaxis will be with Lovenox.      Melissa L. Lovena Le, MD  Electronically Signed     MLT/MEDQ  D:  01/20/2006  T:  01/20/2006  Job:  [id number]

## 2010-07-22 NOTE — Discharge Summary (Signed)
[name], Sue NO.:  [id number]   MEDICAL RECORD NO.:  [id number]          PATIENT TYPE:  INP   LOCATION:  5703                         FACILITY:  [facility]   PHYSICIAN:  Carey Bullocks. Gertie Exon, MD     DATE OF BIRTH:  November 11, 1955   DATE OF ADMISSION:  01/30/2006  DATE OF DISCHARGE:  02/02/2006                               DISCHARGE SUMMARY   DISCHARGE DIAGNOSES:  1. Diabetic gastroparesis.  2. Nausea, vomiting, and abdominal pain.   SUMMARY OF HOSPITALIZATION:  [name] is a 55 year old female with a  past medical history of diabetes and hypertension.  She was recently  hospitalized in November 2007 with acute pancreatitis.  She returned  approximately 1 week from discharge with nausea, vomiting, and abdominal  pain, which were similar to the symptoms she experienced during her bout  of pancreatitis.  On initial clinical examination, she was found to be  hypertensive with blood pressure 186/77 but vital signs were otherwise  normal.  She was in no acute distress and her physical exam was  significant mainly for diffuse abdominal tenderness but with normal  bowel sounds.  Her laboratory data on admission was notable for  hypokalemia with a potassium of 2.4, bicarbonate of 32, and unremarkable  CBC other than mild anemia and mild pancytopenia with a hematocrit of  32.8 and platelet count of 188.  Her Lipase was normal at 11.  She was  admitted with a diagnosis of electrolyte depletion, dehydration, and  vomiting; however, because of her normal Lipase it was felt that this  was not related to pancreatitis but rather more likely diabetic  gastroparesis.   PAST MEDICAL HISTORY:  The patient's past medical history is otherwise  significant for diabetic neuritis, hypertension, and she has a history  of congestive heart failure.   MEDICATIONS:  1. Lantus 26 units subcutaneous daily.  2. Humalog insulin sliding scale.  3. Metoprolol 50 mg twice daily.  4. Lasix 40 mg  once daily.  5. Altace 5 mg once daily.  6. Clonidine 0.1 mg twice daily.  7. Reglan 5 mg 4 times daily.  8. Glimepiride 4 mg 1 tablet daily.   HOSPITAL COURSE:  The patient was admitted, her electrolytes were  repleted, and she was given IV fluids.  Her abdominal pain was  controlled with IV Dilaudid.  She was given Zofran and Phenergan to  control her nausea.  The patient improved symptomatically with this  management.  A gastric emptying study was performed that revealed severe  delays in gastric motility confirming the diagnosis of diabetic  gastroparesis.  The only other events of note that happened during her  hospitalization were some episodes of hypertension with systolic blood  pressures as high as the [id number] and diastolic blood pressures in the  90's.  These were controlled with p.r.n. doses of labetalol.  She  remained hypertensive thereafter but in a safer range and her blood  pressure at the time of discharge was 165/69.  The patient's  electrolytes normalized after her hydration and electrolyte repletion  and she was able to  tolerate solid food at the time of discharge.   At this time she is being discharged to the care of her primary care  physician Dr. Mercie Blackwell with [facility].  I have  suggested to the patient that she modify her diet to take small,  frequent, meals rather than large infrequent meals and this should  prevent episodes of vomiting and food intolerance.  I also suggested  that she visit a local gastroenterologist if she is interested in  further specialty input.  She felt that it would be inconvenient for her  to come to [facility] for her gastroenterology care.  If it does deem  necessary.   FINAL DIAGNOSES/PLAN:  1. Diabetic gastroparesis.  The patient is already on Reglan 4 times      daily.  This will continue with discharge.  I will leave the      decision as the whether gastroenterology referral is necessary to      her and  her primary care physician.  2. Nausea, vomiting, dehydration, and electrolyte depletion.  These      are improved at the time of discharge.  3. Diabetes mellitus.  The patients blood sugars were monitored during      her hospitalization.  Her highest blood sugar was 396 on the day of      discharge but this was managed with an insulin sliding scale.  4. Hypertension.  The patient has episodes of significant hypertension      during the hospitalization.  These might have been due to pain.  I      have opted not to change her outpatient medications high blood      pressure but these should be followed up by her primary care      physician.      Carey Bullocks. Gertie Exon, MD  Electronically Signed     PML/MEDQ  D:  02/02/2006  T:  02/03/2006  Job:  [id number]

## 2010-07-22 NOTE — H&P (Signed)
NAMEESTEPHANI, MEWBORN NO.:  [id number]   MEDICAL RECORD NO.:  [id number]          PATIENT TYPE:  EMS   LOCATION:  MINO                         FACILITY:  [facility]   PHYSICIAN:  Girard Cooter, MD         DATE OF BIRTH:  [birth year]   DATE OF ADMISSION:  01/30/2006  DATE OF DISCHARGE:                                HISTORY & PHYSICAL   PRIMARY CARE PHYSICIAN:  [facility].   HISTORY OF PRESENT ILLNESS:  The patient is a 55 year old female with  recurrent pancreatitis.  Her last admission was  January 23, 2006.  At that  time, she was admitted for a tender abdomen, which is similar to what she is  experiencing right now.  It is located mid abdomen, accompanied by nausea  and vomiting.  There is no constipation.   PAST MEDICAL HISTORY:  1. She has a past medical history of poorly controlled diabetes.  2. She has recurrent episodes of this abdominal pain, nausea and vomiting,      consistent with DM- Gastroperesis.   SOCIAL HISTORY:  The patient denies any alcohol abuse.   REVIEW OF SYSTEMS:  She denies any fevers, cardiac chest pain or shortness  of breath.  Otherwise, review of systems is negative.   PAST MEDICAL HISTORY:  1. Acute pancreatitis.  2. Diabetic gastroparesis.  3. Diabetic neuritis, poorly controlled.  4. Hypertension.  5. Congestive heart failure.   ALLERGIES:  No known drug allergies.   PAST SURGICAL HISTORY:  A tubal ligation.   SOCIAL HISTORY:  No drugs, alcohol or tobacco.   FAMILY HISTORY:  Her mom died from coronary artery disease.  She had  diabetes and hypertension and kidney disease.  Father deceased from coronary  artery disease, diabetes, stroke and myocardial infarction.   ALLERGIES:  No known drug allergies.   MEDICATIONS:  1. The patient takes Lantus 26 units subcutaneous daily.  2. Humalog sliding scale insulin.  3. Metoprolol 50 mg one tab p.o. b.i.d.  4. Lasix 40 mg one tab daily.  5. Altace 5 mg one tablet  daily.  6. Clonidine 0.1 mg one tablet p.o. b.i.d.  7. Reglan 5 mg one tablet four times a day.  8. Glimepiride 4 mg one tab p.o. daily.   PHYSICAL EXAMINATION:  GENERAL:  The patient is in no acute distress.  VITAL SIGNS:  Blood pressure 186/77, pulse 65, respirations 16, pulse  oximetry 100% on room air.  HEENT:  Normocephalic and atraumatic.  Pupils equal, round and reactive to  light.  Extraocular muscles intact.  Mucous membranes are moist.  NECK:  Supple.  No JVD, no thyromegaly, no lymph nodes, no carotid bruits.  CHEST:  Clear to auscultation bilaterally.  No rhonchi, no rales, no  wheezes.  CARDIOVASCULAR:  S1 and S2.  Regular rate and rhythm.  No murmurs, rubs,  clicks.  ABDOMEN:  Soft but diffusely tender.  No rebound or guarding.  Positive  bowel sounds.  EXTREMITIES:  No clubbing, cyanosis, or edema.  NEUROLOGIC:  Awake and oriented x3.  Cranial nerves II-XII  grossly intact.  Strength 5/5 bilaterally in upper and lower extremities.  Deep tendon  reflexes were 2+ bilaterally, upper and lower extremities.   LABORATORY DATA:  UA shows positive ketones, trace leukocyte esterase.  White count is 6.8, hemoglobin 11.5, hematocrit 32.8, platelet count 188.  Sodium was 138, potassium is 2.4, CO2 of 32, glucose 114, BUN 8, creatinine  0.7.  Lipase is 11.   The patient was started on IV hydration.  The pain was controlled with  Dilaudid IV.  The patient had a working diagnosis of pancreatitis.   ASSESSMENT AND PLAN:  This is a 55 year old female with recurrent  gastroparesis secondary to uncontrolled diabetes and secondary pancreatitis,  abdominal pain.  I do not think she has pancreatitis right now with a lipase  of 11.  This was the working diagnosis, however, in the emergency room.  She  is not dehydrated, abd her potassium is low.  We are going to go ahead and  admit her for IV hydration and replete her potassium.  Will go ahead and  control her pain.  I may consider getting  a  CT scan of the abdomen to see  if there are any abscesses or other infectious process going on, depening on  her coarse.  Her white count is normal.  She is hemodynamically stable.      Girard Cooter, MD  Electronically Signed     RR/MEDQ  D:  01/31/2006  T:  01/31/2006  Job:  [id number]

## 2010-07-22 NOTE — Assessment & Plan Note (Signed)
[name] OFFICE NOTE   [name], [name]                         MRN:          [id number]  DATE:06/22/2006                            DOB:          1955-10-28    HISTORY OF PRESENT ILLNESS:  The patient is a 55 year old female with a  history of non-obstructive coronary artery disease.  The patient was  admitted to [facility] with chest pain.  She had a  catheterization done which showed small vessel disease and branch  disease.  It was treated medically.  The patient has a long standing  history of abdominal pain which in retrospect turns out to be secondary  to gastroparesis with underlying gallbladder disease.  The patient as a  matter of fact was more recently hospitalized on June 05, 2006, at  [facility] with recurrent nausea and stomach pain.  She was  started on domperidone, a motility agent used in [place] and [place] for  quite some time.  The patient has obtained the drug from [place].  She  has much improved with this therapy.  She stopped the Reglan in the  meanwhile she has had no recurrence of abnormal chest pain.  When I saw  the patient in April 2007 I also made note of small pulmonary nodules  which require a follow up CT scan.  She has no groin complication  related to recent catheterization.   MEDICATIONS:  1. Amoral 4 mg  daily.  2. Norvasc 5 mg a day.  3. Metoprolol 5 mg daily.  4. Zocor 40 mg daily.  5. Furosemide 40 mg daily.  6. Lisinopril 20 mg daily.  7. Fluoxetine 20 mg daily.  8. Percocet p.r.n.  pain.  9. Sliding scale insulin.   Reglan was discontinued.   PHYSICAL EXAMINATION:  VITAL SIGNS:  Blood pressure is 129/67, heart  rate 69 beats per minute, weight is 140 pounds.  NECK:  Exam normal carotids.  No carotid bruits.  LUNGS:  Clear breath sounds bilaterally.  HEART:  Regular rate and rhythm.  Normal S1, S2.  No murmurs or gallops.  ABDOMEN:  Soft,  nontender, no rebounds, no guarding, good bowel sounds.  EXTREMITIES:  Exam no cyanosis, clubbing or edema.  NEURO:  Patient alert, oriented, grossly nonfocal.   PROBLEM:  1. Diabetic gastroparesis with recurrent nausea on Motilium now.  2. Diabetes mellitus.  3. A non-obstructive coronary artery disease with branch vessel      disease.  4. Diastolic dysfunction.  5. Anemia of chronic disease.  6. Dyslipidemia.  7. Pulmonary nodule.   PLAN:  1. The patient will have a followup CT scan done in June 2008 to      follow up her 2 small pulmonary nodules.  2. The patient can follow up with her primary care physician regarding      her diabetes, gastroparesis and gallbladder disease. She has been      scheduled for an evaluation for possible cholecystitis in the next      few days.  She is doing well on the Motilium,  a motility agent.  3. From the cardiovascular perspective, the patient is stable.  She      has had no recurrent chest pain.  Her blood pressure is under      control and will continue on her current medical regimen.  4. Will follow up with the patient in 6 months and in the interim she      will also have carotid Dopplers done.     Ernestine Mcmurray, MD,FACC  Electronically Signed    GED/MedQ  DD: 06/22/2006  DT: 06/22/2006  Job #: [id number]

## 2010-07-22 NOTE — Discharge Summary (Signed)
NAMEJACULIN, [name] NO.:  [id number]   MEDICAL RECORD NO.:  [id number]          PATIENT TYPE:  INP   LOCATION:  [id number]                         FACILITY:  [facility]   PHYSICIAN:  Annita Brod, M.D.DATE OF BIRTH:  [birth year]   DATE OF ADMISSION:  01/19/2006  DATE OF DISCHARGE:  01/23/2006                                 DISCHARGE SUMMARY   PRIMARY CARE PHYSICIAN:  [facility]   DISCHARGE DIAGNOSES:  1. Acute pancreatitis.  2. Diabetic gastroparesis.  3. Diabetes mellitus, poorly controlled.  4. Hypertension.  5. Congestive heart failure, stable.   DISCHARGE MEDICATIONS:  The patient will be continued all of her previous  medications.  1. Humalog sliding scale insulin.  2. Metoprolol 50 mg p.o. b.i.d.  3. Lasix 40 p.o. daily.  4. Altace 5p.o. daily.  5. Clonidine 0.1 p.o. b.i.d.  6. Reglan 5 mg p.o. four times a day; with advisement that with episodes      of decreased bowel movements or increased abdominal pain, to take an      additional dose of Reglan.  7. Amaryl 4 mg p.o. daily.  8. The patient also was previously on Lantus at 26 units subcu daily.  Her      appetite is slowly returning, but she is advised to decrease her Lantus      by half until her appetite is back to normal; so she will be discharged      on Lantus 13 units subcu daily with instructions to increase it back to      her previous dose once her appetite has was resumed back to normal.   Additional medications will include:  1. Phenergan 12.5 p.o. q.6h. p.r.n.  2. Percocet 5/225 one p.o. q.6h. p.r.n. total #30.   DISCHARGE DIET:  The discharge diet will be a heart-healthy carb modified  diet.   DISCHARGE ACTIVITIES:  Activity will be slow to increase and she is advised  not to drive for 24 hours while taking Phenergan.   DISPOSITION:  The patient's overall disposition is improved and she is  discharged to home.   HOSPITAL COURSE:  The patient is a 55 year old  white female with past  medical history of poorly controlled diabetes mellitus who presented to the  emergency room with complaints of abdominal pain, nausea and vomiting.  In  the emergency room, she was found to have a lipase level elevated at 76.  The rest of her labs were essentially unremarkable; although she had a low  albumin level of 2.6.  Everything else was normal and she had a previous  documented ejection fraction of 55-60% on her previous echo in regards to  her congestive heart failure.  The patient was admitted for treatment of  pancreatitis.  She was made n.p.o., put on IV fluids and pain and nausea  medications.  Over the next day, she showed slow improvement and she was  able to be started on clear liquids.  Her lipase level trended down and by  November 18, it was down to normal.  She tolerated the clear liquids  well,  although she continued to complain of some generalized abdominal pain which  was different than initial presentation of focal midepigastric pain.  This  was felt to be more secondary to diabetic gastroparesis.  Reglan was added  when she was well-hydrated and her diet was able to be advanced.  By  November 19, she was tolerating solid foods without complaint.  She had 2  episodes of hypoglycemia; question secondary to poor appetite during her  hospitalization.  At that point, we recommended then that she change her  Lantus down to 13 units until she was able to tolerate a regular diet.  Patient's overall other medical issues were well-controlled.  In regards to  her pancreatitis, she denies any alcohol.  Her gallbladder was evaluated and  found to be normal and a fasting lipid profile showed no evidence of any  hypertriglyceridemia.  In regards to the cause of her pancreatitis, it is  unclear.  It is possible that this may be alcohol-related and she may not be  fully disclosing that.  In review of her medications, she has a few  possibilities including  Altace as a cause for pancreatitis, although this  does not occur frequently; would favor continue to monitor the patient and  if she has a repeat episode of pancreatitis, would then consider further  exploration of alcohol or possibly discontinue her ACE inhibitor.      Annita Brod, M.D.  Electronically Signed     SKK/MEDQ  D:  01/23/2006  T:  01/23/2006  Job:  [id number]

## 2010-07-22 NOTE — Op Note (Signed)
[name], ARD                ACCOUNT NO.:  [id number]   MEDICAL RECORD NO.:  [id number]          PATIENT TYPE:  AMB   LOCATION:  SDS                          FACILITY:  [facility]   PHYSICIAN:  Theotis Burrow IV, MDDATE OF BIRTH:  Apr 03, 1955   DATE OF PROCEDURE:  DATE OF DISCHARGE:  09/24/2008                               OPERATIVE REPORT   PREOPERATIVE DIAGNOSIS:  Chronic kidney disease.   POSTOPERATIVE DIAGNOSIS:  Chronic kidney disease.   PROCEDURE PERFORMED:  A 25-cm Palindrome catheter under ultrasound  guidance.   TYPE OF ANESTHESIA:  MAC.   COMPLICATIONS:  None.   BLOOD LOSS:  Minimal.   PROCEDURE IN DETAIL:  The patient was identified in the holding and  taken to room 11.  She was placed supine on the table.  The right neck  and chest were prepped and draped in usual fashion.  A time-out was  called.  Antibiotics were given.  Ultrasound was used to evaluate the  right internal jugular vein.  It was widely patent, easily compressible,  1% lidocaine was used for local anesthesia.  A #11 blade was used to  make a skin nick and then using an 18-gauge needle, the right internal  jugular vein was accessed under ultrasound guidance.  An 0.035 wire was  then advanced into the inferior vena cava under fluoroscopic  visualization.  Next, the subcutaneous tract was dilated with sequential  dilators and a peel-away sheath was placed.  The introducer and wire  were then removed and the sheath was sealed with its hemostatic  mechanism.  Next, a 25-cm catheter was selected.  A site was selected  for the skin exit site.  This area was anesthetized with 1% lidocaine as  was the anticipated site of the subcutaneous tunnel.  A #11 blade was  used to make a skin nick.  The tunnel was then passed through the skin  nick in a subcutaneous fashion to the incision in the neck.  The  catheter was pulled through, the cuff was situated at the skin exit  site.  Next, the catheter was advanced  through the peel-away sheath,  which was then removed.  Fluoroscopy was used to confirm that the  catheter tip was at the cavoatrial junction.  Both ports flushed and  aspirated without difficulty.  The catheter was sewn in place with 3-0  nylon.  The incision in the neck was closed with 4-0 Vicryl.  The  catheter was filled with the appropriate volumes of heparin.  The  patient was taken to recovery room in stable condition.  There are no  complications.      Eldridge Abrahams, MD  Electronically Signed     VWB/MEDQ  D:  09/25/2008  T:  09/26/2008  Job:  [id number]

## 2010-07-22 NOTE — H&P (Signed)
[name], [name]                ACCOUNT NO.:  [id number]   MEDICAL RECORD NO.:  [id number]          PATIENT TYPE:  INP   LOCATION:  6524                         FACILITY:  [facility]   PHYSICIAN:  Shaune Pascal. Bensimhon, MDDATE OF BIRTH:  20-Jan-1956   DATE OF ADMISSION:  05/30/2006  DATE OF DISCHARGE:                              HISTORY & PHYSICAL   PRIMARY CARDIOLOGIST:  Ernestine Mcmurray, MD,FACC.   PRIMARY CARE:  Tammy R. Modena Morrow, M.D., [facility] in [place].   HISTORY:  [name] is a 55 year old Caucasian female who we initially  saw in consultation back in November 2007.  [name] was admitted to  [facility] with abdominal pain; was found to have diastolic  congestive heart failure secondary to hypertensive heart disease, in the  setting of bilateral pleural effusions.  The patient underwent an  echocardiogram that showed a normal EF of 55-60%, no evidence of left  ventricular regional wall motion abnormalities, and she did have mild to  moderate mitral valvular regurgitation.  During that hospitalization the  patient also went underwent a CT of the abdomen, pelvis and chest.  She  was found to have 2 small 8 mm nodules in her lungs, with  recommendations for a follow-up CT.  [name] saw  Dr. Dannielle Burn in  followup once at [facility] cardiology office at her discharge in November  2007.   Dr. Dannielle Burn  focused his attention on lowering the patient's blood  pressure.  He made adjustments in her Altace,  increased her Lasix.  He  did not feel ischemic testing was necessary at that time.  He did feel  her diastolic dysfunction was due to her labile blood pressure; and  wanted the patient to follow up with him within a couple weeks for  reevaluation of her blood pressure, and also to repeat her chest x-ray  to document resolution of her bilateral effusions.  He recommended the  patient also have a follow-up CT scan in 6 months, secondary to her  pulmonary nodules.  The patient  never followed up with Dr. Dannielle Burn.  \   We were called by the emergency room staff today to evaluate [name]  for chest discomfort.  She states she has had intermittent mild  tightness substernally for a few weeks, most noted with exertion.  Last  night she had gone out to dinner with her family; upon returning home  she experienced some nausea -- which is not unusual for her.  She has  recently been diagnosed with severe gastroparesis and has suffered a 40+  pound weight loss over the last 6 months; however, her nausea was  accompanied by chest discomfort.  She describes it as a tightness and  pressure substernally.  She became very diaphoretic, lightheaded and  dizzy with it, and mildly short of breath.  She rated the chest pain a 7-  8 on a scale of 1-10.  It waxed and waned for about 35-40 minutes.  She  took one of her pain pills, that only seemed to mildly relieve the  discomfort.  It eventually  resolved spontaneously.  She went on to bed.  This morning she got up and checked her blood pressure; it was 190/107.  (Note:  Her blood pressure yesterday evening was 202/125 at home.)  This  morning the chest pain returned; she states she just does not feel  right.  Chest pain increased to a 9 with the same scenario as last  night.   She went to Dr Teofilo Pod office to get evaluated.  She was given  nitroglycerin, aspirin with some relief in her chest discomfort.  EMS  was summoned.  The patient was transported to [facility] for further  evaluation.   [name] complains of increased fatigue and decreased energy over the  last 3 months, with significant  fluctuations in her blood pressure.  Her CBGs have also run 200-400 in the evening.   ALLERGIES:  NO KNOWN DRUG ALLERGIES.   MEDICATIONS:  1. Glimepiride 4 mg daily.  2. Lantus insulin 20 units at bedtime.  3. Sliding scale Humalog.  4. Reglan 10 mg q.i.d.  5. Lisinopril 20 mg daily.  6. Zofran p.r.n.  7. Vicodin p.r.n.  8.  Paxil 20 mg.  9. Tramadol p.r.n..  10.Phenergan p.r.n.  11.A nerve pill p.r.n.  NOTE:  The patient previously had been on Altace,  Lasix, clonidine and  Lopressor.  She states she is not taking any of these medications any  more.  There is no clear documentation as to why.   PAST MEDICAL HISTORY:  1. Diabetes for 26 years, poorly controlled.  2. Now with gastroparesis and constant abdominal pain.  3. Pancreatitis in 2007.  4. Pulmonary nodules, pending further evaluation.  5. Anemia.  6. Diastolic congestive heart failure.  7. Severe labile hypertension.  8. History of melena, status post colonoscopy earlier this month --      with the removal of several polyps.  9. Depression.   SOCIAL HISTORY:  She lives in [place] with her husband.  She is applying  for disability.  She does not work.  She denies any tobacco, ETOH, drugs  or herbal medication use.  She states she tries to follow an ADA diet,  but not always compliant.  No exercise, secondary to increased fatigue.   FAMILY HISTORY:  Mother deceased in her late 8s, early 37s; she had a  history of kidney disease, hypertension, coronary artery disease.  Father deceased in his 62s; he had a history of CVAs and MIs.  Sister x1  one sibling with no known health problems.   REVIEW OF SYSTEMS:  Positive for chills, sweats, 40-50 pound weight loss  in the last 6 months, headaches, blurred vision mild orthopnea.  The  patient states she sleeps on 2 pillows now.  Mild ankle edema, new  onset.  Dizziness, lightheadedness, chest pain, shortness of breath,  dyspnea on exertion.  GU: Positive for straining at times and nocturia.  Positive for weakness and numbness in lower extremities.  Depression,  nausea, vomiting.  Mild dysphagia at times.  GERD symptoms; abdominal  pain and cold intolerance.   PHYSICAL EXAMINATION:  VITAL SIGNS:  Temperature 97.7, pulses 66, respirations 18, blood pressure 169/73.  The patient's saturations are  99%  on room air.  GENERAL:  She is in no acute distress.  She is pale, appears very tired,  listless.  Appears older than stated age.  HEENT:  Normocephalic, atraumatic.  Pupils equal, round and react to  light.  Sclerae is clear.  NECK:  Supple.  She  has a soft left bruit.  CARDIOVASCULAR:  S1 and S2 regular rate and rhythm.  She has a soft  systolic ejection murmur.  LUNGS: Clear to auscultation bilaterally.  SKIN:  Warm and dry.  She is pale.  ABDOMEN:  Soft; significant tenderness noted across abdomen.  EXTREMITIES:  Lower extremities without clubbing, cyanosis or edema.  NEUROLOGIC:  The patient is oriented x3.  She has a flat affect.   CHEST X-RAY:  Showing no acute findings; appears to have complete  resolution of the previous pleural effusion.   EKG:  Normal sinus rhythm at a rate of 80, without any significant ST or  T-wave changes.   LABS:  Hemoglobin 11.9, hematocrit 35.  Sodium 137, potassium 4.2, BUN  16, creatinine is pending.  Troponin less than 0.05.  Point-of-Care:  BNP 170, PT 13.9, INR 1.1   ASSESSMENT:  Dr. Shaune Pascal. Bensimhon is to examine and assess patient  with chest pain, concerning for angina; also with abdominal tenderness,  with history of pancreatitis, labile hypertension, history of diastolic  CHF, depression and pulmonary nodules.  Newly diagnosed murmur and a  newly diagnosed left carotid bruit.   PLAN:  Admit the patient; rule out.  The patient will need cardiac  catheterization for definitive diagnosis of her chest discomfort, in a  setting of multiple risk factors.  Also concerning for pancreatitis.  We  will check amylase, lipase and LFTs.  If the patient is in active  pancreatitis, will hold off on catheterization and get the patient  stable.  Will possibly proceed with a stress test.  Will check carotid  Dopplers for bruit.  Repeat a 2-D echocardiogram from new onset murmur.  The patient will need a CT to reevaluate her pulmonary nodules.  She   needs better control of blood pressure.  We will start the patient on  labetalol and a statin, if the patient's LFTs and pancreatic function  are okay.  Will also go ahead and do an abdominal ultrasound, in the  setting of the abdominal pain.  Will check a hemoglobin A1c and a  creatinine.  Will continue nitroglycerin and heparin.      Rosanne Sack, ACNP      Shaune Pascal. Bensimhon, MD  Electronically Signed    MB/MEDQ  D:  05/30/2006  T:  05/31/2006  Job:  [id number]   cc:   Tammy R. Modena Morrow, M.D.

## 2010-12-18 DIAGNOSIS — E119 Type 2 diabetes mellitus without complications: Secondary | ICD-10-CM | POA: Insufficient documentation

## 2010-12-18 DIAGNOSIS — I10 Essential (primary) hypertension: Secondary | ICD-10-CM | POA: Insufficient documentation

## 2010-12-22 LAB — DIFFERENTIAL
Basophils Relative: 0
Eosinophils Absolute: 0.3
Lymphs Abs: 1.4
Monocytes Absolute: 0.5
Neutrophils Relative %: 73

## 2010-12-22 LAB — COMPREHENSIVE METABOLIC PANEL
ALT: 20
AST: 19
Alkaline Phosphatase: 80
CO2: 29
GFR calc non Af Amer: 43 — ABNORMAL LOW
Glucose, Bld: 217 — ABNORMAL HIGH
Potassium: 5.5 — ABNORMAL HIGH
Sodium: 139
Total Protein: 6.7

## 2010-12-22 LAB — CBC
Hemoglobin: 11.2 — ABNORMAL LOW
RBC: 3.7 — ABNORMAL LOW

## 2013-07-03 DIAGNOSIS — D638 Anemia in other chronic diseases classified elsewhere: Secondary | ICD-10-CM | POA: Insufficient documentation

## 2015-06-08 DIAGNOSIS — Z7952 Long term (current) use of systemic steroids: Secondary | ICD-10-CM | POA: Diagnosis not present

## 2015-06-08 DIAGNOSIS — I358 Other nonrheumatic aortic valve disorders: Secondary | ICD-10-CM | POA: Insufficient documentation

## 2015-06-08 DIAGNOSIS — I10 Essential (primary) hypertension: Secondary | ICD-10-CM | POA: Diagnosis not present

## 2015-06-08 DIAGNOSIS — R05 Cough: Secondary | ICD-10-CM | POA: Diagnosis not present

## 2015-06-08 DIAGNOSIS — I350 Nonrheumatic aortic (valve) stenosis: Secondary | ICD-10-CM | POA: Diagnosis not present

## 2015-06-08 DIAGNOSIS — Z79899 Other long term (current) drug therapy: Secondary | ICD-10-CM | POA: Diagnosis not present

## 2015-06-08 DIAGNOSIS — R9431 Abnormal electrocardiogram [ECG] [EKG]: Secondary | ICD-10-CM | POA: Diagnosis not present

## 2015-06-08 DIAGNOSIS — I4581 Long QT syndrome: Secondary | ICD-10-CM | POA: Diagnosis not present

## 2015-06-08 DIAGNOSIS — Z7982 Long term (current) use of aspirin: Secondary | ICD-10-CM | POA: Diagnosis not present

## 2015-06-08 DIAGNOSIS — I5032 Chronic diastolic (congestive) heart failure: Secondary | ICD-10-CM | POA: Insufficient documentation

## 2015-06-08 DIAGNOSIS — Z792 Long term (current) use of antibiotics: Secondary | ICD-10-CM | POA: Diagnosis not present

## 2015-06-17 DIAGNOSIS — E119 Type 2 diabetes mellitus without complications: Secondary | ICD-10-CM | POA: Diagnosis not present

## 2015-06-17 DIAGNOSIS — D649 Anemia, unspecified: Secondary | ICD-10-CM | POA: Diagnosis not present

## 2015-06-17 DIAGNOSIS — I082 Rheumatic disorders of both aortic and tricuspid valves: Secondary | ICD-10-CM | POA: Diagnosis not present

## 2015-06-17 DIAGNOSIS — I5189 Other ill-defined heart diseases: Secondary | ICD-10-CM | POA: Diagnosis not present

## 2015-06-17 DIAGNOSIS — I5032 Chronic diastolic (congestive) heart failure: Secondary | ICD-10-CM | POA: Diagnosis not present

## 2015-06-17 DIAGNOSIS — I350 Nonrheumatic aortic (valve) stenosis: Secondary | ICD-10-CM | POA: Diagnosis not present

## 2015-06-17 DIAGNOSIS — I272 Other secondary pulmonary hypertension: Secondary | ICD-10-CM | POA: Diagnosis not present

## 2015-06-17 DIAGNOSIS — Z940 Kidney transplant status: Secondary | ICD-10-CM | POA: Diagnosis not present

## 2015-06-17 DIAGNOSIS — I358 Other nonrheumatic aortic valve disorders: Secondary | ICD-10-CM | POA: Diagnosis not present

## 2015-06-17 DIAGNOSIS — I517 Cardiomegaly: Secondary | ICD-10-CM | POA: Diagnosis not present

## 2015-06-17 DIAGNOSIS — I10 Essential (primary) hypertension: Secondary | ICD-10-CM | POA: Diagnosis not present

## 2015-06-28 DIAGNOSIS — I10 Essential (primary) hypertension: Secondary | ICD-10-CM | POA: Diagnosis not present

## 2015-06-28 DIAGNOSIS — R1013 Epigastric pain: Secondary | ICD-10-CM | POA: Diagnosis not present

## 2015-06-28 DIAGNOSIS — K2900 Acute gastritis without bleeding: Secondary | ICD-10-CM | POA: Diagnosis not present

## 2015-06-28 DIAGNOSIS — K219 Gastro-esophageal reflux disease without esophagitis: Secondary | ICD-10-CM | POA: Diagnosis not present

## 2015-06-28 DIAGNOSIS — R112 Nausea with vomiting, unspecified: Secondary | ICD-10-CM | POA: Diagnosis not present

## 2015-06-28 DIAGNOSIS — J189 Pneumonia, unspecified organism: Secondary | ICD-10-CM | POA: Diagnosis not present

## 2015-06-28 DIAGNOSIS — R1033 Periumbilical pain: Secondary | ICD-10-CM | POA: Diagnosis not present

## 2015-06-28 DIAGNOSIS — Z8701 Personal history of pneumonia (recurrent): Secondary | ICD-10-CM | POA: Diagnosis not present

## 2015-06-28 DIAGNOSIS — Z940 Kidney transplant status: Secondary | ICD-10-CM | POA: Diagnosis not present

## 2015-07-06 DIAGNOSIS — D638 Anemia in other chronic diseases classified elsewhere: Secondary | ICD-10-CM | POA: Diagnosis not present

## 2015-07-06 DIAGNOSIS — Z794 Long term (current) use of insulin: Secondary | ICD-10-CM | POA: Diagnosis not present

## 2015-07-06 DIAGNOSIS — D899 Disorder involving the immune mechanism, unspecified: Secondary | ICD-10-CM | POA: Diagnosis not present

## 2015-07-06 DIAGNOSIS — I358 Other nonrheumatic aortic valve disorders: Secondary | ICD-10-CM | POA: Diagnosis not present

## 2015-07-06 DIAGNOSIS — N184 Chronic kidney disease, stage 4 (severe): Secondary | ICD-10-CM | POA: Diagnosis not present

## 2015-07-06 DIAGNOSIS — I5032 Chronic diastolic (congestive) heart failure: Secondary | ICD-10-CM | POA: Diagnosis not present

## 2015-07-06 DIAGNOSIS — Z940 Kidney transplant status: Secondary | ICD-10-CM | POA: Diagnosis not present

## 2015-07-06 DIAGNOSIS — E1122 Type 2 diabetes mellitus with diabetic chronic kidney disease: Secondary | ICD-10-CM | POA: Diagnosis not present

## 2015-07-06 DIAGNOSIS — I110 Hypertensive heart disease with heart failure: Secondary | ICD-10-CM | POA: Diagnosis not present

## 2015-07-06 DIAGNOSIS — I129 Hypertensive chronic kidney disease with stage 1 through stage 4 chronic kidney disease, or unspecified chronic kidney disease: Secondary | ICD-10-CM | POA: Diagnosis not present

## 2015-07-06 DIAGNOSIS — R0989 Other specified symptoms and signs involving the circulatory and respiratory systems: Secondary | ICD-10-CM | POA: Diagnosis not present

## 2015-07-06 DIAGNOSIS — Z7982 Long term (current) use of aspirin: Secondary | ICD-10-CM | POA: Diagnosis not present

## 2015-07-06 DIAGNOSIS — Z992 Dependence on renal dialysis: Secondary | ICD-10-CM | POA: Diagnosis not present

## 2015-07-06 DIAGNOSIS — D649 Anemia, unspecified: Secondary | ICD-10-CM | POA: Diagnosis not present

## 2015-07-06 DIAGNOSIS — Z4822 Encounter for aftercare following kidney transplant: Secondary | ICD-10-CM | POA: Diagnosis not present

## 2015-07-20 DIAGNOSIS — N184 Chronic kidney disease, stage 4 (severe): Secondary | ICD-10-CM | POA: Diagnosis not present

## 2015-07-20 DIAGNOSIS — D631 Anemia in chronic kidney disease: Secondary | ICD-10-CM | POA: Diagnosis not present

## 2015-07-20 DIAGNOSIS — E611 Iron deficiency: Secondary | ICD-10-CM | POA: Diagnosis not present

## 2015-07-22 DIAGNOSIS — J029 Acute pharyngitis, unspecified: Secondary | ICD-10-CM | POA: Diagnosis not present

## 2015-07-22 DIAGNOSIS — Z9851 Tubal ligation status: Secondary | ICD-10-CM | POA: Diagnosis not present

## 2015-07-22 DIAGNOSIS — K219 Gastro-esophageal reflux disease without esophagitis: Secondary | ICD-10-CM | POA: Diagnosis not present

## 2015-07-22 DIAGNOSIS — I509 Heart failure, unspecified: Secondary | ICD-10-CM | POA: Diagnosis not present

## 2015-07-22 DIAGNOSIS — M7989 Other specified soft tissue disorders: Secondary | ICD-10-CM | POA: Diagnosis not present

## 2015-07-22 DIAGNOSIS — R112 Nausea with vomiting, unspecified: Secondary | ICD-10-CM | POA: Diagnosis not present

## 2015-07-22 DIAGNOSIS — Z940 Kidney transplant status: Secondary | ICD-10-CM | POA: Diagnosis not present

## 2015-07-22 DIAGNOSIS — R0602 Shortness of breath: Secondary | ICD-10-CM | POA: Diagnosis not present

## 2015-07-22 DIAGNOSIS — E119 Type 2 diabetes mellitus without complications: Secondary | ICD-10-CM | POA: Diagnosis not present

## 2015-07-22 DIAGNOSIS — E785 Hyperlipidemia, unspecified: Secondary | ICD-10-CM | POA: Diagnosis not present

## 2015-07-22 DIAGNOSIS — R05 Cough: Secondary | ICD-10-CM | POA: Diagnosis not present

## 2015-07-26 DIAGNOSIS — Z940 Kidney transplant status: Secondary | ICD-10-CM | POA: Diagnosis not present

## 2015-07-26 DIAGNOSIS — N184 Chronic kidney disease, stage 4 (severe): Secondary | ICD-10-CM | POA: Diagnosis not present

## 2015-07-26 DIAGNOSIS — D631 Anemia in chronic kidney disease: Secondary | ICD-10-CM | POA: Diagnosis not present

## 2015-08-03 DIAGNOSIS — Z940 Kidney transplant status: Secondary | ICD-10-CM | POA: Diagnosis not present

## 2015-08-03 DIAGNOSIS — N184 Chronic kidney disease, stage 4 (severe): Secondary | ICD-10-CM | POA: Diagnosis not present

## 2015-08-03 DIAGNOSIS — D631 Anemia in chronic kidney disease: Secondary | ICD-10-CM | POA: Diagnosis not present

## 2015-08-03 DIAGNOSIS — Z79899 Other long term (current) drug therapy: Secondary | ICD-10-CM | POA: Diagnosis not present

## 2015-08-17 DIAGNOSIS — I358 Other nonrheumatic aortic valve disorders: Secondary | ICD-10-CM | POA: Diagnosis not present

## 2015-08-17 DIAGNOSIS — I5032 Chronic diastolic (congestive) heart failure: Secondary | ICD-10-CM | POA: Diagnosis not present

## 2015-08-17 DIAGNOSIS — I10 Essential (primary) hypertension: Secondary | ICD-10-CM | POA: Diagnosis not present

## 2015-08-18 DIAGNOSIS — N184 Chronic kidney disease, stage 4 (severe): Secondary | ICD-10-CM | POA: Diagnosis not present

## 2015-08-18 DIAGNOSIS — Z940 Kidney transplant status: Secondary | ICD-10-CM | POA: Diagnosis not present

## 2015-08-26 DIAGNOSIS — I10 Essential (primary) hypertension: Secondary | ICD-10-CM | POA: Diagnosis not present

## 2015-08-26 DIAGNOSIS — N186 End stage renal disease: Secondary | ICD-10-CM | POA: Diagnosis not present

## 2015-08-26 DIAGNOSIS — E7800 Pure hypercholesterolemia, unspecified: Secondary | ICD-10-CM | POA: Diagnosis not present

## 2015-08-26 DIAGNOSIS — E1065 Type 1 diabetes mellitus with hyperglycemia: Secondary | ICD-10-CM | POA: Diagnosis not present

## 2015-08-26 DIAGNOSIS — I5020 Unspecified systolic (congestive) heart failure: Secondary | ICD-10-CM | POA: Diagnosis not present

## 2015-08-27 DIAGNOSIS — L578 Other skin changes due to chronic exposure to nonionizing radiation: Secondary | ICD-10-CM | POA: Diagnosis not present

## 2015-08-27 DIAGNOSIS — D489 Neoplasm of uncertain behavior, unspecified: Secondary | ICD-10-CM | POA: Diagnosis not present

## 2015-08-27 DIAGNOSIS — L814 Other melanin hyperpigmentation: Secondary | ICD-10-CM | POA: Diagnosis not present

## 2015-08-27 DIAGNOSIS — L812 Freckles: Secondary | ICD-10-CM | POA: Diagnosis not present

## 2015-08-27 DIAGNOSIS — L821 Other seborrheic keratosis: Secondary | ICD-10-CM | POA: Diagnosis not present

## 2015-08-27 DIAGNOSIS — Z1389 Encounter for screening for other disorder: Secondary | ICD-10-CM | POA: Diagnosis not present

## 2015-09-01 DIAGNOSIS — I10 Essential (primary) hypertension: Secondary | ICD-10-CM | POA: Diagnosis not present

## 2015-09-01 DIAGNOSIS — E1140 Type 2 diabetes mellitus with diabetic neuropathy, unspecified: Secondary | ICD-10-CM | POA: Diagnosis not present

## 2015-09-01 DIAGNOSIS — E1065 Type 1 diabetes mellitus with hyperglycemia: Secondary | ICD-10-CM | POA: Diagnosis not present

## 2015-09-08 DIAGNOSIS — Z940 Kidney transplant status: Secondary | ICD-10-CM | POA: Diagnosis not present

## 2015-09-08 DIAGNOSIS — E1065 Type 1 diabetes mellitus with hyperglycemia: Secondary | ICD-10-CM | POA: Diagnosis not present

## 2015-09-08 DIAGNOSIS — N184 Chronic kidney disease, stage 4 (severe): Secondary | ICD-10-CM | POA: Diagnosis not present

## 2015-09-08 DIAGNOSIS — N189 Chronic kidney disease, unspecified: Secondary | ICD-10-CM | POA: Diagnosis not present

## 2015-09-08 DIAGNOSIS — D631 Anemia in chronic kidney disease: Secondary | ICD-10-CM | POA: Diagnosis not present

## 2015-09-21 DIAGNOSIS — Z940 Kidney transplant status: Secondary | ICD-10-CM | POA: Diagnosis not present

## 2015-09-21 DIAGNOSIS — Z7982 Long term (current) use of aspirin: Secondary | ICD-10-CM | POA: Diagnosis not present

## 2015-09-21 DIAGNOSIS — I129 Hypertensive chronic kidney disease with stage 1 through stage 4 chronic kidney disease, or unspecified chronic kidney disease: Secondary | ICD-10-CM | POA: Diagnosis not present

## 2015-09-21 DIAGNOSIS — Z794 Long term (current) use of insulin: Secondary | ICD-10-CM | POA: Diagnosis not present

## 2015-09-21 DIAGNOSIS — D899 Disorder involving the immune mechanism, unspecified: Secondary | ICD-10-CM | POA: Diagnosis not present

## 2015-09-21 DIAGNOSIS — Z7952 Long term (current) use of systemic steroids: Secondary | ICD-10-CM | POA: Diagnosis not present

## 2015-09-21 DIAGNOSIS — Z4822 Encounter for aftercare following kidney transplant: Secondary | ICD-10-CM | POA: Diagnosis not present

## 2015-09-21 DIAGNOSIS — N184 Chronic kidney disease, stage 4 (severe): Secondary | ICD-10-CM | POA: Diagnosis not present

## 2015-09-21 DIAGNOSIS — E1122 Type 2 diabetes mellitus with diabetic chronic kidney disease: Secondary | ICD-10-CM | POA: Diagnosis not present

## 2015-09-21 DIAGNOSIS — Z79899 Other long term (current) drug therapy: Secondary | ICD-10-CM | POA: Diagnosis not present

## 2015-09-22 DIAGNOSIS — D631 Anemia in chronic kidney disease: Secondary | ICD-10-CM | POA: Diagnosis not present

## 2015-09-22 DIAGNOSIS — N184 Chronic kidney disease, stage 4 (severe): Secondary | ICD-10-CM | POA: Diagnosis not present

## 2015-09-22 DIAGNOSIS — N189 Chronic kidney disease, unspecified: Secondary | ICD-10-CM | POA: Diagnosis not present

## 2015-10-06 DIAGNOSIS — Z940 Kidney transplant status: Secondary | ICD-10-CM | POA: Diagnosis not present

## 2015-10-06 DIAGNOSIS — Z79899 Other long term (current) drug therapy: Secondary | ICD-10-CM | POA: Diagnosis not present

## 2015-10-27 DIAGNOSIS — D631 Anemia in chronic kidney disease: Secondary | ICD-10-CM | POA: Diagnosis not present

## 2015-10-27 DIAGNOSIS — N189 Chronic kidney disease, unspecified: Secondary | ICD-10-CM | POA: Diagnosis not present

## 2015-10-27 DIAGNOSIS — Z940 Kidney transplant status: Secondary | ICD-10-CM | POA: Diagnosis not present

## 2015-10-27 DIAGNOSIS — N184 Chronic kidney disease, stage 4 (severe): Secondary | ICD-10-CM | POA: Diagnosis not present

## 2015-11-10 DIAGNOSIS — D631 Anemia in chronic kidney disease: Secondary | ICD-10-CM | POA: Diagnosis not present

## 2015-11-10 DIAGNOSIS — N184 Chronic kidney disease, stage 4 (severe): Secondary | ICD-10-CM | POA: Diagnosis not present

## 2015-11-16 DIAGNOSIS — I358 Other nonrheumatic aortic valve disorders: Secondary | ICD-10-CM | POA: Diagnosis not present

## 2015-11-16 DIAGNOSIS — I10 Essential (primary) hypertension: Secondary | ICD-10-CM | POA: Diagnosis not present

## 2015-11-16 DIAGNOSIS — I5032 Chronic diastolic (congestive) heart failure: Secondary | ICD-10-CM | POA: Diagnosis not present

## 2015-12-02 DIAGNOSIS — Z940 Kidney transplant status: Secondary | ICD-10-CM | POA: Diagnosis not present

## 2015-12-02 DIAGNOSIS — N184 Chronic kidney disease, stage 4 (severe): Secondary | ICD-10-CM | POA: Diagnosis not present

## 2015-12-02 DIAGNOSIS — D631 Anemia in chronic kidney disease: Secondary | ICD-10-CM | POA: Diagnosis not present

## 2015-12-04 DIAGNOSIS — M79642 Pain in left hand: Secondary | ICD-10-CM | POA: Diagnosis not present

## 2015-12-04 DIAGNOSIS — M25532 Pain in left wrist: Secondary | ICD-10-CM | POA: Diagnosis not present

## 2015-12-04 DIAGNOSIS — S6991XA Unspecified injury of right wrist, hand and finger(s), initial encounter: Secondary | ICD-10-CM | POA: Diagnosis not present

## 2015-12-04 DIAGNOSIS — S40812A Abrasion of left upper arm, initial encounter: Secondary | ICD-10-CM | POA: Diagnosis not present

## 2015-12-04 DIAGNOSIS — M79601 Pain in right arm: Secondary | ICD-10-CM | POA: Diagnosis not present

## 2015-12-04 DIAGNOSIS — S0081XA Abrasion of other part of head, initial encounter: Secondary | ICD-10-CM | POA: Diagnosis not present

## 2015-12-04 DIAGNOSIS — S80812A Abrasion, left lower leg, initial encounter: Secondary | ICD-10-CM | POA: Diagnosis not present

## 2015-12-04 DIAGNOSIS — M79602 Pain in left arm: Secondary | ICD-10-CM | POA: Diagnosis not present

## 2015-12-04 DIAGNOSIS — S6992XA Unspecified injury of left wrist, hand and finger(s), initial encounter: Secondary | ICD-10-CM | POA: Diagnosis not present

## 2015-12-04 DIAGNOSIS — S40811A Abrasion of right upper arm, initial encounter: Secondary | ICD-10-CM | POA: Diagnosis not present

## 2015-12-04 DIAGNOSIS — M25531 Pain in right wrist: Secondary | ICD-10-CM | POA: Diagnosis not present

## 2015-12-04 DIAGNOSIS — S0990XA Unspecified injury of head, initial encounter: Secondary | ICD-10-CM | POA: Diagnosis not present

## 2015-12-04 DIAGNOSIS — S199XXA Unspecified injury of neck, initial encounter: Secondary | ICD-10-CM | POA: Diagnosis not present

## 2015-12-05 DIAGNOSIS — Y9301 Activity, walking, marching and hiking: Secondary | ICD-10-CM | POA: Diagnosis not present

## 2015-12-05 DIAGNOSIS — Z043 Encounter for examination and observation following other accident: Secondary | ICD-10-CM | POA: Diagnosis not present

## 2015-12-05 DIAGNOSIS — I509 Heart failure, unspecified: Secondary | ICD-10-CM | POA: Diagnosis not present

## 2015-12-05 DIAGNOSIS — I130 Hypertensive heart and chronic kidney disease with heart failure and stage 1 through stage 4 chronic kidney disease, or unspecified chronic kidney disease: Secondary | ICD-10-CM | POA: Diagnosis not present

## 2015-12-05 DIAGNOSIS — N189 Chronic kidney disease, unspecified: Secondary | ICD-10-CM | POA: Diagnosis not present

## 2015-12-05 DIAGNOSIS — S0083XA Contusion of other part of head, initial encounter: Secondary | ICD-10-CM | POA: Diagnosis not present

## 2015-12-05 DIAGNOSIS — E785 Hyperlipidemia, unspecified: Secondary | ICD-10-CM | POA: Diagnosis not present

## 2015-12-05 DIAGNOSIS — W010XXA Fall on same level from slipping, tripping and stumbling without subsequent striking against object, initial encounter: Secondary | ICD-10-CM | POA: Diagnosis not present

## 2015-12-05 DIAGNOSIS — M791 Myalgia: Secondary | ICD-10-CM | POA: Diagnosis not present

## 2015-12-05 DIAGNOSIS — Z940 Kidney transplant status: Secondary | ICD-10-CM | POA: Diagnosis not present

## 2015-12-05 DIAGNOSIS — E1122 Type 2 diabetes mellitus with diabetic chronic kidney disease: Secondary | ICD-10-CM | POA: Diagnosis not present

## 2015-12-14 DIAGNOSIS — E872 Acidosis: Secondary | ICD-10-CM | POA: Diagnosis not present

## 2015-12-14 DIAGNOSIS — I129 Hypertensive chronic kidney disease with stage 1 through stage 4 chronic kidney disease, or unspecified chronic kidney disease: Secondary | ICD-10-CM | POA: Diagnosis not present

## 2015-12-14 DIAGNOSIS — D899 Disorder involving the immune mechanism, unspecified: Secondary | ICD-10-CM | POA: Diagnosis not present

## 2015-12-14 DIAGNOSIS — N184 Chronic kidney disease, stage 4 (severe): Secondary | ICD-10-CM | POA: Diagnosis not present

## 2015-12-14 DIAGNOSIS — Z940 Kidney transplant status: Secondary | ICD-10-CM | POA: Diagnosis not present

## 2015-12-16 DIAGNOSIS — D509 Iron deficiency anemia, unspecified: Secondary | ICD-10-CM | POA: Diagnosis not present

## 2015-12-16 DIAGNOSIS — N184 Chronic kidney disease, stage 4 (severe): Secondary | ICD-10-CM | POA: Diagnosis not present

## 2015-12-23 DIAGNOSIS — Z940 Kidney transplant status: Secondary | ICD-10-CM | POA: Diagnosis not present

## 2015-12-30 DIAGNOSIS — N184 Chronic kidney disease, stage 4 (severe): Secondary | ICD-10-CM | POA: Diagnosis not present

## 2015-12-30 DIAGNOSIS — D631 Anemia in chronic kidney disease: Secondary | ICD-10-CM | POA: Diagnosis not present

## 2015-12-30 DIAGNOSIS — Z940 Kidney transplant status: Secondary | ICD-10-CM | POA: Diagnosis not present

## 2015-12-31 DIAGNOSIS — E1065 Type 1 diabetes mellitus with hyperglycemia: Secondary | ICD-10-CM | POA: Diagnosis not present

## 2015-12-31 DIAGNOSIS — E7800 Pure hypercholesterolemia, unspecified: Secondary | ICD-10-CM | POA: Diagnosis not present

## 2015-12-31 DIAGNOSIS — I10 Essential (primary) hypertension: Secondary | ICD-10-CM | POA: Diagnosis not present

## 2016-01-05 DIAGNOSIS — N184 Chronic kidney disease, stage 4 (severe): Secondary | ICD-10-CM | POA: Diagnosis not present

## 2016-01-20 DIAGNOSIS — N184 Chronic kidney disease, stage 4 (severe): Secondary | ICD-10-CM | POA: Diagnosis not present

## 2016-01-20 DIAGNOSIS — Z940 Kidney transplant status: Secondary | ICD-10-CM | POA: Diagnosis not present

## 2016-01-20 DIAGNOSIS — D631 Anemia in chronic kidney disease: Secondary | ICD-10-CM | POA: Diagnosis not present

## 2016-01-20 DIAGNOSIS — D899 Disorder involving the immune mechanism, unspecified: Secondary | ICD-10-CM | POA: Diagnosis not present

## 2016-01-22 DIAGNOSIS — D72829 Elevated white blood cell count, unspecified: Secondary | ICD-10-CM | POA: Diagnosis not present

## 2016-01-22 DIAGNOSIS — R197 Diarrhea, unspecified: Secondary | ICD-10-CM | POA: Diagnosis not present

## 2016-01-22 DIAGNOSIS — R112 Nausea with vomiting, unspecified: Secondary | ICD-10-CM | POA: Diagnosis not present

## 2016-01-22 DIAGNOSIS — R011 Cardiac murmur, unspecified: Secondary | ICD-10-CM | POA: Diagnosis not present

## 2016-01-22 DIAGNOSIS — Z9049 Acquired absence of other specified parts of digestive tract: Secondary | ICD-10-CM | POA: Diagnosis not present

## 2016-01-22 DIAGNOSIS — Z940 Kidney transplant status: Secondary | ICD-10-CM | POA: Diagnosis not present

## 2016-01-22 DIAGNOSIS — E785 Hyperlipidemia, unspecified: Secondary | ICD-10-CM | POA: Diagnosis not present

## 2016-01-22 DIAGNOSIS — Z862 Personal history of diseases of the blood and blood-forming organs and certain disorders involving the immune mechanism: Secondary | ICD-10-CM | POA: Diagnosis not present

## 2016-02-03 DIAGNOSIS — Z940 Kidney transplant status: Secondary | ICD-10-CM | POA: Diagnosis not present

## 2016-02-03 DIAGNOSIS — N184 Chronic kidney disease, stage 4 (severe): Secondary | ICD-10-CM | POA: Diagnosis not present

## 2016-02-03 DIAGNOSIS — D631 Anemia in chronic kidney disease: Secondary | ICD-10-CM | POA: Diagnosis not present

## 2016-02-17 DIAGNOSIS — Z940 Kidney transplant status: Secondary | ICD-10-CM | POA: Diagnosis not present

## 2016-02-17 DIAGNOSIS — D899 Disorder involving the immune mechanism, unspecified: Secondary | ICD-10-CM | POA: Diagnosis not present

## 2016-03-02 DIAGNOSIS — I350 Nonrheumatic aortic (valve) stenosis: Secondary | ICD-10-CM | POA: Diagnosis not present

## 2016-03-02 DIAGNOSIS — J129 Viral pneumonia, unspecified: Secondary | ICD-10-CM | POA: Diagnosis not present

## 2016-03-02 DIAGNOSIS — E785 Hyperlipidemia, unspecified: Secondary | ICD-10-CM | POA: Diagnosis not present

## 2016-03-02 DIAGNOSIS — I5033 Acute on chronic diastolic (congestive) heart failure: Secondary | ICD-10-CM | POA: Diagnosis not present

## 2016-03-02 DIAGNOSIS — N184 Chronic kidney disease, stage 4 (severe): Secondary | ICD-10-CM | POA: Diagnosis not present

## 2016-03-02 DIAGNOSIS — R05 Cough: Secondary | ICD-10-CM | POA: Diagnosis not present

## 2016-03-02 DIAGNOSIS — R112 Nausea with vomiting, unspecified: Secondary | ICD-10-CM | POA: Diagnosis not present

## 2016-03-02 DIAGNOSIS — Z940 Kidney transplant status: Secondary | ICD-10-CM | POA: Diagnosis not present

## 2016-03-02 DIAGNOSIS — D899 Disorder involving the immune mechanism, unspecified: Secondary | ICD-10-CM | POA: Diagnosis not present

## 2016-03-02 DIAGNOSIS — R509 Fever, unspecified: Secondary | ICD-10-CM | POA: Diagnosis not present

## 2016-03-02 DIAGNOSIS — R9431 Abnormal electrocardiogram [ECG] [EKG]: Secondary | ICD-10-CM | POA: Diagnosis not present

## 2016-03-02 DIAGNOSIS — E1122 Type 2 diabetes mellitus with diabetic chronic kidney disease: Secondary | ICD-10-CM | POA: Diagnosis not present

## 2016-03-02 DIAGNOSIS — Z794 Long term (current) use of insulin: Secondary | ICD-10-CM | POA: Diagnosis not present

## 2016-03-02 DIAGNOSIS — N179 Acute kidney failure, unspecified: Secondary | ICD-10-CM | POA: Diagnosis not present

## 2016-03-02 DIAGNOSIS — I10 Essential (primary) hypertension: Secondary | ICD-10-CM | POA: Diagnosis not present

## 2016-03-02 DIAGNOSIS — R110 Nausea: Secondary | ICD-10-CM | POA: Diagnosis not present

## 2016-03-02 DIAGNOSIS — T8611 Kidney transplant rejection: Secondary | ICD-10-CM | POA: Diagnosis not present

## 2016-03-02 DIAGNOSIS — N186 End stage renal disease: Secondary | ICD-10-CM | POA: Diagnosis not present

## 2016-03-02 DIAGNOSIS — E1121 Type 2 diabetes mellitus with diabetic nephropathy: Secondary | ICD-10-CM | POA: Diagnosis not present

## 2016-03-02 DIAGNOSIS — K219 Gastro-esophageal reflux disease without esophagitis: Secondary | ICD-10-CM | POA: Diagnosis not present

## 2016-03-02 DIAGNOSIS — Z79899 Other long term (current) drug therapy: Secondary | ICD-10-CM | POA: Diagnosis not present

## 2016-03-02 DIAGNOSIS — I129 Hypertensive chronic kidney disease with stage 1 through stage 4 chronic kidney disease, or unspecified chronic kidney disease: Secondary | ICD-10-CM | POA: Diagnosis not present

## 2016-03-02 DIAGNOSIS — R0602 Shortness of breath: Secondary | ICD-10-CM | POA: Diagnosis not present

## 2016-03-02 DIAGNOSIS — J189 Pneumonia, unspecified organism: Secondary | ICD-10-CM | POA: Diagnosis not present

## 2016-03-02 DIAGNOSIS — D631 Anemia in chronic kidney disease: Secondary | ICD-10-CM | POA: Diagnosis not present

## 2016-03-02 DIAGNOSIS — R918 Other nonspecific abnormal finding of lung field: Secondary | ICD-10-CM | POA: Diagnosis not present

## 2016-03-02 DIAGNOSIS — I130 Hypertensive heart and chronic kidney disease with heart failure and stage 1 through stage 4 chronic kidney disease, or unspecified chronic kidney disease: Secondary | ICD-10-CM | POA: Diagnosis not present

## 2016-03-05 DIAGNOSIS — R05 Cough: Secondary | ICD-10-CM | POA: Diagnosis not present

## 2016-03-05 DIAGNOSIS — R112 Nausea with vomiting, unspecified: Secondary | ICD-10-CM | POA: Diagnosis not present

## 2016-03-05 DIAGNOSIS — I129 Hypertensive chronic kidney disease with stage 1 through stage 4 chronic kidney disease, or unspecified chronic kidney disease: Secondary | ICD-10-CM | POA: Diagnosis not present

## 2016-03-05 DIAGNOSIS — E785 Hyperlipidemia, unspecified: Secondary | ICD-10-CM | POA: Diagnosis not present

## 2016-03-05 DIAGNOSIS — R918 Other nonspecific abnormal finding of lung field: Secondary | ICD-10-CM | POA: Diagnosis not present

## 2016-03-05 DIAGNOSIS — R110 Nausea: Secondary | ICD-10-CM | POA: Diagnosis not present

## 2016-03-06 DIAGNOSIS — Z940 Kidney transplant status: Secondary | ICD-10-CM | POA: Diagnosis not present

## 2016-03-06 DIAGNOSIS — I10 Essential (primary) hypertension: Secondary | ICD-10-CM | POA: Diagnosis not present

## 2016-03-06 DIAGNOSIS — R9431 Abnormal electrocardiogram [ECG] [EKG]: Secondary | ICD-10-CM | POA: Diagnosis not present

## 2016-03-06 DIAGNOSIS — R05 Cough: Secondary | ICD-10-CM | POA: Diagnosis not present

## 2016-03-06 DIAGNOSIS — N186 End stage renal disease: Secondary | ICD-10-CM | POA: Diagnosis not present

## 2016-03-06 DIAGNOSIS — Z79899 Other long term (current) drug therapy: Secondary | ICD-10-CM | POA: Diagnosis not present

## 2016-03-06 DIAGNOSIS — J189 Pneumonia, unspecified organism: Secondary | ICD-10-CM | POA: Diagnosis not present

## 2016-03-06 DIAGNOSIS — R0602 Shortness of breath: Secondary | ICD-10-CM | POA: Diagnosis not present

## 2016-03-06 DIAGNOSIS — R509 Fever, unspecified: Secondary | ICD-10-CM | POA: Diagnosis not present

## 2016-03-06 DIAGNOSIS — I129 Hypertensive chronic kidney disease with stage 1 through stage 4 chronic kidney disease, or unspecified chronic kidney disease: Secondary | ICD-10-CM | POA: Diagnosis not present

## 2016-03-07 DIAGNOSIS — Z79899 Other long term (current) drug therapy: Secondary | ICD-10-CM | POA: Diagnosis not present

## 2016-03-07 DIAGNOSIS — N186 End stage renal disease: Secondary | ICD-10-CM | POA: Diagnosis not present

## 2016-03-07 DIAGNOSIS — R509 Fever, unspecified: Secondary | ICD-10-CM | POA: Diagnosis not present

## 2016-03-07 DIAGNOSIS — Z940 Kidney transplant status: Secondary | ICD-10-CM | POA: Diagnosis not present

## 2016-03-07 DIAGNOSIS — R05 Cough: Secondary | ICD-10-CM | POA: Diagnosis not present

## 2016-03-07 DIAGNOSIS — R0602 Shortness of breath: Secondary | ICD-10-CM | POA: Diagnosis not present

## 2016-03-07 DIAGNOSIS — D899 Disorder involving the immune mechanism, unspecified: Secondary | ICD-10-CM | POA: Diagnosis not present

## 2016-03-07 DIAGNOSIS — J189 Pneumonia, unspecified organism: Secondary | ICD-10-CM | POA: Diagnosis not present

## 2016-03-08 DIAGNOSIS — J189 Pneumonia, unspecified organism: Secondary | ICD-10-CM | POA: Diagnosis not present

## 2016-03-08 DIAGNOSIS — D899 Disorder involving the immune mechanism, unspecified: Secondary | ICD-10-CM | POA: Diagnosis not present

## 2016-03-08 DIAGNOSIS — Z940 Kidney transplant status: Secondary | ICD-10-CM | POA: Diagnosis not present

## 2016-03-08 DIAGNOSIS — R05 Cough: Secondary | ICD-10-CM | POA: Diagnosis not present

## 2016-03-08 DIAGNOSIS — R0602 Shortness of breath: Secondary | ICD-10-CM | POA: Diagnosis not present

## 2016-03-08 DIAGNOSIS — N179 Acute kidney failure, unspecified: Secondary | ICD-10-CM | POA: Diagnosis not present

## 2016-03-08 DIAGNOSIS — R509 Fever, unspecified: Secondary | ICD-10-CM | POA: Diagnosis not present

## 2016-03-09 DIAGNOSIS — R509 Fever, unspecified: Secondary | ICD-10-CM | POA: Diagnosis not present

## 2016-03-09 DIAGNOSIS — J189 Pneumonia, unspecified organism: Secondary | ICD-10-CM | POA: Diagnosis not present

## 2016-03-09 DIAGNOSIS — Z940 Kidney transplant status: Secondary | ICD-10-CM | POA: Diagnosis not present

## 2016-03-09 DIAGNOSIS — D899 Disorder involving the immune mechanism, unspecified: Secondary | ICD-10-CM | POA: Diagnosis not present

## 2016-03-09 DIAGNOSIS — R0602 Shortness of breath: Secondary | ICD-10-CM | POA: Diagnosis not present

## 2016-03-09 DIAGNOSIS — R05 Cough: Secondary | ICD-10-CM | POA: Diagnosis not present

## 2016-03-09 DIAGNOSIS — N179 Acute kidney failure, unspecified: Secondary | ICD-10-CM | POA: Diagnosis not present

## 2016-03-10 DIAGNOSIS — D899 Disorder involving the immune mechanism, unspecified: Secondary | ICD-10-CM | POA: Diagnosis not present

## 2016-03-10 DIAGNOSIS — N179 Acute kidney failure, unspecified: Secondary | ICD-10-CM | POA: Diagnosis not present

## 2016-03-10 DIAGNOSIS — R0602 Shortness of breath: Secondary | ICD-10-CM | POA: Diagnosis not present

## 2016-03-10 DIAGNOSIS — R05 Cough: Secondary | ICD-10-CM | POA: Diagnosis not present

## 2016-03-10 DIAGNOSIS — R509 Fever, unspecified: Secondary | ICD-10-CM | POA: Diagnosis not present

## 2016-03-10 DIAGNOSIS — J189 Pneumonia, unspecified organism: Secondary | ICD-10-CM | POA: Diagnosis not present

## 2016-03-10 DIAGNOSIS — Z940 Kidney transplant status: Secondary | ICD-10-CM | POA: Diagnosis not present

## 2016-03-11 DIAGNOSIS — R509 Fever, unspecified: Secondary | ICD-10-CM | POA: Diagnosis not present

## 2016-03-11 DIAGNOSIS — R05 Cough: Secondary | ICD-10-CM | POA: Diagnosis not present

## 2016-03-11 DIAGNOSIS — N179 Acute kidney failure, unspecified: Secondary | ICD-10-CM | POA: Diagnosis not present

## 2016-03-11 DIAGNOSIS — Z940 Kidney transplant status: Secondary | ICD-10-CM | POA: Diagnosis not present

## 2016-03-11 DIAGNOSIS — R0602 Shortness of breath: Secondary | ICD-10-CM | POA: Diagnosis not present

## 2016-03-11 DIAGNOSIS — D899 Disorder involving the immune mechanism, unspecified: Secondary | ICD-10-CM | POA: Diagnosis not present

## 2016-03-11 DIAGNOSIS — J189 Pneumonia, unspecified organism: Secondary | ICD-10-CM | POA: Diagnosis not present

## 2016-03-12 DIAGNOSIS — N179 Acute kidney failure, unspecified: Secondary | ICD-10-CM | POA: Diagnosis not present

## 2016-03-12 DIAGNOSIS — J189 Pneumonia, unspecified organism: Secondary | ICD-10-CM | POA: Diagnosis not present

## 2016-03-12 DIAGNOSIS — R05 Cough: Secondary | ICD-10-CM | POA: Diagnosis not present

## 2016-03-12 DIAGNOSIS — D899 Disorder involving the immune mechanism, unspecified: Secondary | ICD-10-CM | POA: Diagnosis not present

## 2016-03-12 DIAGNOSIS — N184 Chronic kidney disease, stage 4 (severe): Secondary | ICD-10-CM | POA: Diagnosis not present

## 2016-03-12 DIAGNOSIS — Z940 Kidney transplant status: Secondary | ICD-10-CM | POA: Diagnosis not present

## 2016-03-16 DIAGNOSIS — D631 Anemia in chronic kidney disease: Secondary | ICD-10-CM | POA: Diagnosis not present

## 2016-03-16 DIAGNOSIS — D509 Iron deficiency anemia, unspecified: Secondary | ICD-10-CM | POA: Diagnosis not present

## 2016-03-16 DIAGNOSIS — N184 Chronic kidney disease, stage 4 (severe): Secondary | ICD-10-CM | POA: Diagnosis not present

## 2016-03-22 DIAGNOSIS — R432 Parageusia: Secondary | ICD-10-CM | POA: Diagnosis not present

## 2016-03-22 DIAGNOSIS — Z940 Kidney transplant status: Secondary | ICD-10-CM | POA: Diagnosis not present

## 2016-03-22 DIAGNOSIS — E872 Acidosis: Secondary | ICD-10-CM | POA: Diagnosis not present

## 2016-03-22 DIAGNOSIS — I10 Essential (primary) hypertension: Secondary | ICD-10-CM | POA: Diagnosis not present

## 2016-03-22 DIAGNOSIS — Z79899 Other long term (current) drug therapy: Secondary | ICD-10-CM | POA: Diagnosis not present

## 2016-03-22 DIAGNOSIS — R630 Anorexia: Secondary | ICD-10-CM | POA: Diagnosis not present

## 2016-03-22 DIAGNOSIS — I120 Hypertensive chronic kidney disease with stage 5 chronic kidney disease or end stage renal disease: Secondary | ICD-10-CM | POA: Diagnosis not present

## 2016-03-22 DIAGNOSIS — R197 Diarrhea, unspecified: Secondary | ICD-10-CM | POA: Diagnosis not present

## 2016-03-22 DIAGNOSIS — Z792 Long term (current) use of antibiotics: Secondary | ICD-10-CM | POA: Diagnosis not present

## 2016-03-22 DIAGNOSIS — Z7982 Long term (current) use of aspirin: Secondary | ICD-10-CM | POA: Diagnosis not present

## 2016-03-22 DIAGNOSIS — R110 Nausea: Secondary | ICD-10-CM | POA: Diagnosis not present

## 2016-03-22 DIAGNOSIS — Z4822 Encounter for aftercare following kidney transplant: Secondary | ICD-10-CM | POA: Diagnosis not present

## 2016-03-30 DIAGNOSIS — N184 Chronic kidney disease, stage 4 (severe): Secondary | ICD-10-CM | POA: Diagnosis not present

## 2016-03-30 DIAGNOSIS — Z940 Kidney transplant status: Secondary | ICD-10-CM | POA: Diagnosis not present

## 2016-03-30 DIAGNOSIS — D631 Anemia in chronic kidney disease: Secondary | ICD-10-CM | POA: Diagnosis not present

## 2016-04-04 DIAGNOSIS — E785 Hyperlipidemia, unspecified: Secondary | ICD-10-CM | POA: Diagnosis not present

## 2016-04-04 DIAGNOSIS — Z01818 Encounter for other preprocedural examination: Secondary | ICD-10-CM | POA: Diagnosis not present

## 2016-04-04 DIAGNOSIS — E1122 Type 2 diabetes mellitus with diabetic chronic kidney disease: Secondary | ICD-10-CM | POA: Diagnosis not present

## 2016-04-04 DIAGNOSIS — T8611 Kidney transplant rejection: Secondary | ICD-10-CM | POA: Diagnosis not present

## 2016-04-04 DIAGNOSIS — I132 Hypertensive heart and chronic kidney disease with heart failure and with stage 5 chronic kidney disease, or end stage renal disease: Secondary | ICD-10-CM | POA: Diagnosis not present

## 2016-04-04 DIAGNOSIS — I351 Nonrheumatic aortic (valve) insufficiency: Secondary | ICD-10-CM | POA: Diagnosis not present

## 2016-04-04 DIAGNOSIS — I879 Disorder of vein, unspecified: Secondary | ICD-10-CM | POA: Diagnosis not present

## 2016-04-04 DIAGNOSIS — I272 Pulmonary hypertension, unspecified: Secondary | ICD-10-CM | POA: Diagnosis not present

## 2016-04-04 DIAGNOSIS — Y830 Surgical operation with transplant of whole organ as the cause of abnormal reaction of the patient, or of later complication, without mention of misadventure at the time of the procedure: Secondary | ICD-10-CM | POA: Diagnosis not present

## 2016-04-04 DIAGNOSIS — Z794 Long term (current) use of insulin: Secondary | ICD-10-CM | POA: Diagnosis not present

## 2016-04-04 DIAGNOSIS — K219 Gastro-esophageal reflux disease without esophagitis: Secondary | ICD-10-CM | POA: Diagnosis not present

## 2016-04-04 DIAGNOSIS — D649 Anemia, unspecified: Secondary | ICD-10-CM | POA: Diagnosis not present

## 2016-04-04 DIAGNOSIS — I5032 Chronic diastolic (congestive) heart failure: Secondary | ICD-10-CM | POA: Diagnosis not present

## 2016-04-06 DIAGNOSIS — I351 Nonrheumatic aortic (valve) insufficiency: Secondary | ICD-10-CM | POA: Diagnosis not present

## 2016-04-06 DIAGNOSIS — N186 End stage renal disease: Secondary | ICD-10-CM | POA: Diagnosis not present

## 2016-04-06 DIAGNOSIS — D649 Anemia, unspecified: Secondary | ICD-10-CM | POA: Diagnosis not present

## 2016-04-06 DIAGNOSIS — Y830 Surgical operation with transplant of whole organ as the cause of abnormal reaction of the patient, or of later complication, without mention of misadventure at the time of the procedure: Secondary | ICD-10-CM | POA: Diagnosis not present

## 2016-04-06 DIAGNOSIS — I132 Hypertensive heart and chronic kidney disease with heart failure and with stage 5 chronic kidney disease, or end stage renal disease: Secondary | ICD-10-CM | POA: Diagnosis not present

## 2016-04-06 DIAGNOSIS — I272 Pulmonary hypertension, unspecified: Secondary | ICD-10-CM | POA: Diagnosis not present

## 2016-04-06 DIAGNOSIS — E1122 Type 2 diabetes mellitus with diabetic chronic kidney disease: Secondary | ICD-10-CM | POA: Diagnosis not present

## 2016-04-06 DIAGNOSIS — Z794 Long term (current) use of insulin: Secondary | ICD-10-CM | POA: Diagnosis not present

## 2016-04-06 DIAGNOSIS — K219 Gastro-esophageal reflux disease without esophagitis: Secondary | ICD-10-CM | POA: Diagnosis not present

## 2016-04-06 DIAGNOSIS — I5032 Chronic diastolic (congestive) heart failure: Secondary | ICD-10-CM | POA: Diagnosis not present

## 2016-04-06 DIAGNOSIS — E785 Hyperlipidemia, unspecified: Secondary | ICD-10-CM | POA: Diagnosis not present

## 2016-04-06 DIAGNOSIS — T8611 Kidney transplant rejection: Secondary | ICD-10-CM | POA: Diagnosis not present

## 2016-04-13 DIAGNOSIS — N184 Chronic kidney disease, stage 4 (severe): Secondary | ICD-10-CM | POA: Diagnosis not present

## 2016-04-26 DIAGNOSIS — N184 Chronic kidney disease, stage 4 (severe): Secondary | ICD-10-CM | POA: Diagnosis not present

## 2016-04-26 DIAGNOSIS — D509 Iron deficiency anemia, unspecified: Secondary | ICD-10-CM | POA: Diagnosis not present

## 2016-05-02 DIAGNOSIS — E1122 Type 2 diabetes mellitus with diabetic chronic kidney disease: Secondary | ICD-10-CM | POA: Diagnosis not present

## 2016-05-02 DIAGNOSIS — I129 Hypertensive chronic kidney disease with stage 1 through stage 4 chronic kidney disease, or unspecified chronic kidney disease: Secondary | ICD-10-CM | POA: Diagnosis not present

## 2016-05-02 DIAGNOSIS — N184 Chronic kidney disease, stage 4 (severe): Secondary | ICD-10-CM | POA: Diagnosis not present

## 2016-05-02 DIAGNOSIS — D631 Anemia in chronic kidney disease: Secondary | ICD-10-CM | POA: Diagnosis not present

## 2016-05-10 DIAGNOSIS — D631 Anemia in chronic kidney disease: Secondary | ICD-10-CM | POA: Diagnosis not present

## 2016-05-10 DIAGNOSIS — N183 Chronic kidney disease, stage 3 (moderate): Secondary | ICD-10-CM | POA: Diagnosis not present

## 2016-05-22 DIAGNOSIS — N186 End stage renal disease: Secondary | ICD-10-CM | POA: Diagnosis not present

## 2016-05-22 DIAGNOSIS — N184 Chronic kidney disease, stage 4 (severe): Secondary | ICD-10-CM | POA: Diagnosis not present

## 2016-05-22 DIAGNOSIS — I5032 Chronic diastolic (congestive) heart failure: Secondary | ICD-10-CM | POA: Diagnosis not present

## 2016-05-22 DIAGNOSIS — Z940 Kidney transplant status: Secondary | ICD-10-CM

## 2016-05-22 DIAGNOSIS — Z4822 Encounter for aftercare following kidney transplant: Secondary | ICD-10-CM | POA: Diagnosis not present

## 2016-05-22 DIAGNOSIS — D631 Anemia in chronic kidney disease: Secondary | ICD-10-CM | POA: Diagnosis not present

## 2016-05-22 DIAGNOSIS — Z794 Long term (current) use of insulin: Secondary | ICD-10-CM | POA: Diagnosis not present

## 2016-05-22 DIAGNOSIS — E785 Hyperlipidemia, unspecified: Secondary | ICD-10-CM | POA: Diagnosis not present

## 2016-05-22 DIAGNOSIS — Z992 Dependence on renal dialysis: Secondary | ICD-10-CM | POA: Diagnosis not present

## 2016-05-22 DIAGNOSIS — D899 Disorder involving the immune mechanism, unspecified: Secondary | ICD-10-CM | POA: Diagnosis not present

## 2016-05-22 DIAGNOSIS — I132 Hypertensive heart and chronic kidney disease with heart failure and with stage 5 chronic kidney disease, or end stage renal disease: Secondary | ICD-10-CM | POA: Diagnosis not present

## 2016-05-22 DIAGNOSIS — E872 Acidosis: Secondary | ICD-10-CM | POA: Diagnosis not present

## 2016-05-22 DIAGNOSIS — Z7982 Long term (current) use of aspirin: Secondary | ICD-10-CM | POA: Diagnosis not present

## 2016-05-22 DIAGNOSIS — Z79899 Other long term (current) drug therapy: Secondary | ICD-10-CM | POA: Diagnosis not present

## 2016-05-22 DIAGNOSIS — E1122 Type 2 diabetes mellitus with diabetic chronic kidney disease: Secondary | ICD-10-CM | POA: Diagnosis not present

## 2016-05-22 DIAGNOSIS — I272 Pulmonary hypertension, unspecified: Secondary | ICD-10-CM | POA: Diagnosis not present

## 2016-05-22 HISTORY — DX: Kidney transplant status: Z94.0

## 2016-05-24 DIAGNOSIS — N184 Chronic kidney disease, stage 4 (severe): Secondary | ICD-10-CM | POA: Diagnosis not present

## 2016-05-24 DIAGNOSIS — D509 Iron deficiency anemia, unspecified: Secondary | ICD-10-CM | POA: Diagnosis not present

## 2016-06-13 DIAGNOSIS — M79671 Pain in right foot: Secondary | ICD-10-CM | POA: Diagnosis not present

## 2016-06-13 DIAGNOSIS — M19071 Primary osteoarthritis, right ankle and foot: Secondary | ICD-10-CM | POA: Diagnosis not present

## 2016-06-13 DIAGNOSIS — M8580 Other specified disorders of bone density and structure, unspecified site: Secondary | ICD-10-CM | POA: Diagnosis not present

## 2016-06-13 DIAGNOSIS — R1110 Vomiting, unspecified: Secondary | ICD-10-CM | POA: Diagnosis not present

## 2016-06-13 DIAGNOSIS — R2241 Localized swelling, mass and lump, right lower limb: Secondary | ICD-10-CM | POA: Diagnosis not present

## 2016-06-13 DIAGNOSIS — R112 Nausea with vomiting, unspecified: Secondary | ICD-10-CM | POA: Diagnosis not present

## 2016-06-13 DIAGNOSIS — M7989 Other specified soft tissue disorders: Secondary | ICD-10-CM | POA: Diagnosis not present

## 2016-06-15 DIAGNOSIS — Z940 Kidney transplant status: Secondary | ICD-10-CM | POA: Diagnosis not present

## 2016-06-15 DIAGNOSIS — N184 Chronic kidney disease, stage 4 (severe): Secondary | ICD-10-CM | POA: Diagnosis not present

## 2016-06-15 DIAGNOSIS — D631 Anemia in chronic kidney disease: Secondary | ICD-10-CM | POA: Diagnosis not present

## 2016-06-16 DIAGNOSIS — R0989 Other specified symptoms and signs involving the circulatory and respiratory systems: Secondary | ICD-10-CM | POA: Diagnosis not present

## 2016-06-16 DIAGNOSIS — M79671 Pain in right foot: Secondary | ICD-10-CM | POA: Diagnosis not present

## 2016-06-16 DIAGNOSIS — Z7982 Long term (current) use of aspirin: Secondary | ICD-10-CM | POA: Diagnosis not present

## 2016-06-16 DIAGNOSIS — Z794 Long term (current) use of insulin: Secondary | ICD-10-CM | POA: Diagnosis not present

## 2016-06-16 DIAGNOSIS — E119 Type 2 diabetes mellitus without complications: Secondary | ICD-10-CM | POA: Diagnosis not present

## 2016-06-16 DIAGNOSIS — Z940 Kidney transplant status: Secondary | ICD-10-CM | POA: Diagnosis not present

## 2016-06-16 DIAGNOSIS — I10 Essential (primary) hypertension: Secondary | ICD-10-CM | POA: Diagnosis not present

## 2016-06-16 DIAGNOSIS — R112 Nausea with vomiting, unspecified: Secondary | ICD-10-CM | POA: Diagnosis not present

## 2016-06-16 DIAGNOSIS — Z79899 Other long term (current) drug therapy: Secondary | ICD-10-CM | POA: Diagnosis not present

## 2016-06-19 DIAGNOSIS — R197 Diarrhea, unspecified: Secondary | ICD-10-CM | POA: Diagnosis not present

## 2016-06-19 DIAGNOSIS — I509 Heart failure, unspecified: Secondary | ICD-10-CM | POA: Diagnosis not present

## 2016-06-19 DIAGNOSIS — Z794 Long term (current) use of insulin: Secondary | ICD-10-CM | POA: Diagnosis not present

## 2016-06-19 DIAGNOSIS — D649 Anemia, unspecified: Secondary | ICD-10-CM | POA: Diagnosis not present

## 2016-06-19 DIAGNOSIS — Z7982 Long term (current) use of aspirin: Secondary | ICD-10-CM | POA: Diagnosis not present

## 2016-06-19 DIAGNOSIS — Z79899 Other long term (current) drug therapy: Secondary | ICD-10-CM | POA: Diagnosis not present

## 2016-06-19 DIAGNOSIS — Z8701 Personal history of pneumonia (recurrent): Secondary | ICD-10-CM | POA: Diagnosis not present

## 2016-06-19 DIAGNOSIS — E1122 Type 2 diabetes mellitus with diabetic chronic kidney disease: Secondary | ICD-10-CM | POA: Diagnosis not present

## 2016-06-19 DIAGNOSIS — R112 Nausea with vomiting, unspecified: Secondary | ICD-10-CM | POA: Diagnosis not present

## 2016-06-19 DIAGNOSIS — Z940 Kidney transplant status: Secondary | ICD-10-CM | POA: Diagnosis not present

## 2016-06-19 DIAGNOSIS — R011 Cardiac murmur, unspecified: Secondary | ICD-10-CM | POA: Diagnosis not present

## 2016-06-19 DIAGNOSIS — E860 Dehydration: Secondary | ICD-10-CM | POA: Diagnosis not present

## 2016-06-19 DIAGNOSIS — M1990 Unspecified osteoarthritis, unspecified site: Secondary | ICD-10-CM | POA: Diagnosis not present

## 2016-06-19 DIAGNOSIS — Z7952 Long term (current) use of systemic steroids: Secondary | ICD-10-CM | POA: Diagnosis not present

## 2016-06-19 DIAGNOSIS — I130 Hypertensive heart and chronic kidney disease with heart failure and stage 1 through stage 4 chronic kidney disease, or unspecified chronic kidney disease: Secondary | ICD-10-CM | POA: Diagnosis not present

## 2016-06-19 DIAGNOSIS — R7989 Other specified abnormal findings of blood chemistry: Secondary | ICD-10-CM | POA: Diagnosis not present

## 2016-06-19 DIAGNOSIS — N189 Chronic kidney disease, unspecified: Secondary | ICD-10-CM | POA: Diagnosis not present

## 2016-06-20 DIAGNOSIS — I6523 Occlusion and stenosis of bilateral carotid arteries: Secondary | ICD-10-CM | POA: Diagnosis not present

## 2016-06-20 DIAGNOSIS — I509 Heart failure, unspecified: Secondary | ICD-10-CM | POA: Diagnosis not present

## 2016-06-21 DIAGNOSIS — I130 Hypertensive heart and chronic kidney disease with heart failure and stage 1 through stage 4 chronic kidney disease, or unspecified chronic kidney disease: Secondary | ICD-10-CM | POA: Diagnosis not present

## 2016-06-21 DIAGNOSIS — R112 Nausea with vomiting, unspecified: Secondary | ICD-10-CM | POA: Diagnosis not present

## 2016-06-21 DIAGNOSIS — R197 Diarrhea, unspecified: Secondary | ICD-10-CM | POA: Diagnosis not present

## 2016-06-22 DIAGNOSIS — R0602 Shortness of breath: Secondary | ICD-10-CM | POA: Diagnosis not present

## 2016-06-22 DIAGNOSIS — R197 Diarrhea, unspecified: Secondary | ICD-10-CM | POA: Diagnosis not present

## 2016-06-22 DIAGNOSIS — R112 Nausea with vomiting, unspecified: Secondary | ICD-10-CM | POA: Diagnosis not present

## 2016-06-22 DIAGNOSIS — M79671 Pain in right foot: Secondary | ICD-10-CM | POA: Diagnosis not present

## 2016-06-22 DIAGNOSIS — M10071 Idiopathic gout, right ankle and foot: Secondary | ICD-10-CM | POA: Diagnosis not present

## 2016-06-28 DIAGNOSIS — E1065 Type 1 diabetes mellitus with hyperglycemia: Secondary | ICD-10-CM | POA: Diagnosis not present

## 2016-06-28 DIAGNOSIS — E7800 Pure hypercholesterolemia, unspecified: Secondary | ICD-10-CM | POA: Diagnosis not present

## 2016-06-28 DIAGNOSIS — N186 End stage renal disease: Secondary | ICD-10-CM | POA: Diagnosis not present

## 2016-06-28 DIAGNOSIS — I10 Essential (primary) hypertension: Secondary | ICD-10-CM | POA: Diagnosis not present

## 2016-06-28 DIAGNOSIS — I5020 Unspecified systolic (congestive) heart failure: Secondary | ICD-10-CM | POA: Diagnosis not present

## 2016-06-30 DIAGNOSIS — I132 Hypertensive heart and chronic kidney disease with heart failure and with stage 5 chronic kidney disease, or end stage renal disease: Secondary | ICD-10-CM | POA: Diagnosis not present

## 2016-06-30 DIAGNOSIS — E1121 Type 2 diabetes mellitus with diabetic nephropathy: Secondary | ICD-10-CM | POA: Diagnosis not present

## 2016-06-30 DIAGNOSIS — T8612 Kidney transplant failure: Secondary | ICD-10-CM | POA: Diagnosis not present

## 2016-06-30 DIAGNOSIS — E871 Hypo-osmolality and hyponatremia: Secondary | ICD-10-CM | POA: Diagnosis not present

## 2016-06-30 DIAGNOSIS — R112 Nausea with vomiting, unspecified: Secondary | ICD-10-CM | POA: Diagnosis not present

## 2016-06-30 DIAGNOSIS — N184 Chronic kidney disease, stage 4 (severe): Secondary | ICD-10-CM | POA: Diagnosis not present

## 2016-06-30 DIAGNOSIS — N269 Renal sclerosis, unspecified: Secondary | ICD-10-CM | POA: Diagnosis not present

## 2016-06-30 DIAGNOSIS — J9601 Acute respiratory failure with hypoxia: Secondary | ICD-10-CM | POA: Diagnosis not present

## 2016-06-30 DIAGNOSIS — I5030 Unspecified diastolic (congestive) heart failure: Secondary | ICD-10-CM | POA: Diagnosis not present

## 2016-06-30 DIAGNOSIS — I5031 Acute diastolic (congestive) heart failure: Secondary | ICD-10-CM | POA: Diagnosis not present

## 2016-06-30 DIAGNOSIS — I517 Cardiomegaly: Secondary | ICD-10-CM | POA: Diagnosis not present

## 2016-06-30 DIAGNOSIS — I129 Hypertensive chronic kidney disease with stage 1 through stage 4 chronic kidney disease, or unspecified chronic kidney disease: Secondary | ICD-10-CM | POA: Diagnosis not present

## 2016-06-30 DIAGNOSIS — E785 Hyperlipidemia, unspecified: Secondary | ICD-10-CM | POA: Diagnosis not present

## 2016-06-30 DIAGNOSIS — N189 Chronic kidney disease, unspecified: Secondary | ICD-10-CM | POA: Diagnosis not present

## 2016-06-30 DIAGNOSIS — I491 Atrial premature depolarization: Secondary | ICD-10-CM | POA: Diagnosis not present

## 2016-06-30 DIAGNOSIS — Z8249 Family history of ischemic heart disease and other diseases of the circulatory system: Secondary | ICD-10-CM | POA: Diagnosis not present

## 2016-06-30 DIAGNOSIS — E872 Acidosis: Secondary | ICD-10-CM | POA: Diagnosis not present

## 2016-06-30 DIAGNOSIS — Z940 Kidney transplant status: Secondary | ICD-10-CM | POA: Diagnosis not present

## 2016-06-30 DIAGNOSIS — Z992 Dependence on renal dialysis: Secondary | ICD-10-CM | POA: Diagnosis not present

## 2016-06-30 DIAGNOSIS — I5033 Acute on chronic diastolic (congestive) heart failure: Secondary | ICD-10-CM | POA: Diagnosis not present

## 2016-06-30 DIAGNOSIS — D638 Anemia in other chronic diseases classified elsewhere: Secondary | ICD-10-CM | POA: Diagnosis not present

## 2016-06-30 DIAGNOSIS — Z794 Long term (current) use of insulin: Secondary | ICD-10-CM | POA: Diagnosis not present

## 2016-06-30 DIAGNOSIS — I120 Hypertensive chronic kidney disease with stage 5 chronic kidney disease or end stage renal disease: Secondary | ICD-10-CM | POA: Diagnosis not present

## 2016-06-30 DIAGNOSIS — N261 Atrophy of kidney (terminal): Secondary | ICD-10-CM | POA: Diagnosis not present

## 2016-06-30 DIAGNOSIS — N185 Chronic kidney disease, stage 5: Secondary | ICD-10-CM | POA: Diagnosis not present

## 2016-06-30 DIAGNOSIS — D899 Disorder involving the immune mechanism, unspecified: Secondary | ICD-10-CM | POA: Diagnosis not present

## 2016-06-30 DIAGNOSIS — E8770 Fluid overload, unspecified: Secondary | ICD-10-CM | POA: Diagnosis not present

## 2016-06-30 DIAGNOSIS — I248 Other forms of acute ischemic heart disease: Secondary | ICD-10-CM | POA: Diagnosis not present

## 2016-06-30 DIAGNOSIS — Y95 Nosocomial condition: Secondary | ICD-10-CM | POA: Diagnosis not present

## 2016-06-30 DIAGNOSIS — J189 Pneumonia, unspecified organism: Secondary | ICD-10-CM | POA: Diagnosis not present

## 2016-06-30 DIAGNOSIS — R0602 Shortness of breath: Secondary | ICD-10-CM | POA: Diagnosis not present

## 2016-06-30 DIAGNOSIS — E1122 Type 2 diabetes mellitus with diabetic chronic kidney disease: Secondary | ICD-10-CM | POA: Diagnosis not present

## 2016-06-30 DIAGNOSIS — D72829 Elevated white blood cell count, unspecified: Secondary | ICD-10-CM | POA: Diagnosis not present

## 2016-06-30 DIAGNOSIS — I130 Hypertensive heart and chronic kidney disease with heart failure and stage 1 through stage 4 chronic kidney disease, or unspecified chronic kidney disease: Secondary | ICD-10-CM | POA: Diagnosis not present

## 2016-06-30 DIAGNOSIS — Z79899 Other long term (current) drug therapy: Secondary | ICD-10-CM | POA: Diagnosis not present

## 2016-06-30 DIAGNOSIS — N179 Acute kidney failure, unspecified: Secondary | ICD-10-CM | POA: Diagnosis not present

## 2016-06-30 DIAGNOSIS — K219 Gastro-esophageal reflux disease without esophagitis: Secondary | ICD-10-CM | POA: Diagnosis not present

## 2016-06-30 DIAGNOSIS — R9431 Abnormal electrocardiogram [ECG] [EKG]: Secondary | ICD-10-CM | POA: Diagnosis not present

## 2016-06-30 DIAGNOSIS — I160 Hypertensive urgency: Secondary | ICD-10-CM | POA: Diagnosis not present

## 2016-06-30 DIAGNOSIS — N186 End stage renal disease: Secondary | ICD-10-CM | POA: Diagnosis not present

## 2016-07-01 DIAGNOSIS — E1122 Type 2 diabetes mellitus with diabetic chronic kidney disease: Secondary | ICD-10-CM | POA: Diagnosis not present

## 2016-07-01 DIAGNOSIS — N184 Chronic kidney disease, stage 4 (severe): Secondary | ICD-10-CM | POA: Diagnosis not present

## 2016-07-01 DIAGNOSIS — J189 Pneumonia, unspecified organism: Secondary | ICD-10-CM | POA: Diagnosis not present

## 2016-07-01 DIAGNOSIS — J9601 Acute respiratory failure with hypoxia: Secondary | ICD-10-CM | POA: Diagnosis not present

## 2016-07-01 DIAGNOSIS — I129 Hypertensive chronic kidney disease with stage 1 through stage 4 chronic kidney disease, or unspecified chronic kidney disease: Secondary | ICD-10-CM | POA: Diagnosis not present

## 2016-07-01 DIAGNOSIS — E1121 Type 2 diabetes mellitus with diabetic nephropathy: Secondary | ICD-10-CM | POA: Diagnosis not present

## 2016-07-01 DIAGNOSIS — I5030 Unspecified diastolic (congestive) heart failure: Secondary | ICD-10-CM | POA: Diagnosis not present

## 2016-07-02 DIAGNOSIS — J189 Pneumonia, unspecified organism: Secondary | ICD-10-CM | POA: Diagnosis not present

## 2016-07-02 DIAGNOSIS — I5030 Unspecified diastolic (congestive) heart failure: Secondary | ICD-10-CM | POA: Diagnosis not present

## 2016-07-02 DIAGNOSIS — E1122 Type 2 diabetes mellitus with diabetic chronic kidney disease: Secondary | ICD-10-CM | POA: Diagnosis not present

## 2016-07-02 DIAGNOSIS — J9601 Acute respiratory failure with hypoxia: Secondary | ICD-10-CM | POA: Diagnosis not present

## 2016-07-02 DIAGNOSIS — N184 Chronic kidney disease, stage 4 (severe): Secondary | ICD-10-CM | POA: Diagnosis not present

## 2016-07-02 DIAGNOSIS — E1121 Type 2 diabetes mellitus with diabetic nephropathy: Secondary | ICD-10-CM | POA: Diagnosis not present

## 2016-07-02 DIAGNOSIS — I129 Hypertensive chronic kidney disease with stage 1 through stage 4 chronic kidney disease, or unspecified chronic kidney disease: Secondary | ICD-10-CM | POA: Diagnosis not present

## 2016-07-03 DIAGNOSIS — D72829 Elevated white blood cell count, unspecified: Secondary | ICD-10-CM | POA: Diagnosis not present

## 2016-07-03 DIAGNOSIS — J189 Pneumonia, unspecified organism: Secondary | ICD-10-CM | POA: Diagnosis not present

## 2016-07-03 DIAGNOSIS — R0602 Shortness of breath: Secondary | ICD-10-CM | POA: Diagnosis not present

## 2016-07-03 DIAGNOSIS — Z940 Kidney transplant status: Secondary | ICD-10-CM | POA: Diagnosis not present

## 2016-07-03 DIAGNOSIS — I5030 Unspecified diastolic (congestive) heart failure: Secondary | ICD-10-CM | POA: Diagnosis not present

## 2016-07-03 DIAGNOSIS — J9601 Acute respiratory failure with hypoxia: Secondary | ICD-10-CM | POA: Diagnosis not present

## 2016-07-04 DIAGNOSIS — J189 Pneumonia, unspecified organism: Secondary | ICD-10-CM | POA: Diagnosis not present

## 2016-07-04 DIAGNOSIS — I160 Hypertensive urgency: Secondary | ICD-10-CM | POA: Diagnosis not present

## 2016-07-04 DIAGNOSIS — J9601 Acute respiratory failure with hypoxia: Secondary | ICD-10-CM | POA: Diagnosis not present

## 2016-07-04 DIAGNOSIS — R0602 Shortness of breath: Secondary | ICD-10-CM | POA: Diagnosis not present

## 2016-07-04 DIAGNOSIS — Z940 Kidney transplant status: Secondary | ICD-10-CM | POA: Diagnosis not present

## 2016-07-04 DIAGNOSIS — I5030 Unspecified diastolic (congestive) heart failure: Secondary | ICD-10-CM | POA: Diagnosis not present

## 2016-07-05 DIAGNOSIS — D899 Disorder involving the immune mechanism, unspecified: Secondary | ICD-10-CM | POA: Diagnosis not present

## 2016-07-05 DIAGNOSIS — I160 Hypertensive urgency: Secondary | ICD-10-CM | POA: Diagnosis not present

## 2016-07-05 DIAGNOSIS — Z940 Kidney transplant status: Secondary | ICD-10-CM | POA: Diagnosis not present

## 2016-07-05 DIAGNOSIS — J9601 Acute respiratory failure with hypoxia: Secondary | ICD-10-CM | POA: Diagnosis not present

## 2016-07-05 DIAGNOSIS — J189 Pneumonia, unspecified organism: Secondary | ICD-10-CM | POA: Diagnosis not present

## 2016-07-05 DIAGNOSIS — N179 Acute kidney failure, unspecified: Secondary | ICD-10-CM | POA: Diagnosis not present

## 2016-07-05 DIAGNOSIS — I5030 Unspecified diastolic (congestive) heart failure: Secondary | ICD-10-CM | POA: Diagnosis not present

## 2016-07-05 DIAGNOSIS — R0602 Shortness of breath: Secondary | ICD-10-CM | POA: Diagnosis not present

## 2016-07-05 DIAGNOSIS — E8770 Fluid overload, unspecified: Secondary | ICD-10-CM | POA: Diagnosis not present

## 2016-07-05 DIAGNOSIS — Z79899 Other long term (current) drug therapy: Secondary | ICD-10-CM | POA: Diagnosis not present

## 2016-07-06 DIAGNOSIS — Z940 Kidney transplant status: Secondary | ICD-10-CM | POA: Diagnosis not present

## 2016-07-06 DIAGNOSIS — Z79899 Other long term (current) drug therapy: Secondary | ICD-10-CM | POA: Diagnosis not present

## 2016-07-06 DIAGNOSIS — E1122 Type 2 diabetes mellitus with diabetic chronic kidney disease: Secondary | ICD-10-CM | POA: Diagnosis not present

## 2016-07-06 DIAGNOSIS — E8770 Fluid overload, unspecified: Secondary | ICD-10-CM | POA: Diagnosis not present

## 2016-07-06 DIAGNOSIS — I130 Hypertensive heart and chronic kidney disease with heart failure and stage 1 through stage 4 chronic kidney disease, or unspecified chronic kidney disease: Secondary | ICD-10-CM | POA: Diagnosis not present

## 2016-07-06 DIAGNOSIS — I5031 Acute diastolic (congestive) heart failure: Secondary | ICD-10-CM | POA: Diagnosis not present

## 2016-07-06 DIAGNOSIS — N179 Acute kidney failure, unspecified: Secondary | ICD-10-CM | POA: Diagnosis not present

## 2016-07-07 DIAGNOSIS — N179 Acute kidney failure, unspecified: Secondary | ICD-10-CM | POA: Diagnosis not present

## 2016-07-07 DIAGNOSIS — I132 Hypertensive heart and chronic kidney disease with heart failure and with stage 5 chronic kidney disease, or end stage renal disease: Secondary | ICD-10-CM | POA: Diagnosis not present

## 2016-07-07 DIAGNOSIS — N185 Chronic kidney disease, stage 5: Secondary | ICD-10-CM | POA: Diagnosis not present

## 2016-07-07 DIAGNOSIS — N269 Renal sclerosis, unspecified: Secondary | ICD-10-CM | POA: Diagnosis not present

## 2016-07-07 DIAGNOSIS — J9601 Acute respiratory failure with hypoxia: Secondary | ICD-10-CM | POA: Diagnosis not present

## 2016-07-07 DIAGNOSIS — I130 Hypertensive heart and chronic kidney disease with heart failure and stage 1 through stage 4 chronic kidney disease, or unspecified chronic kidney disease: Secondary | ICD-10-CM | POA: Diagnosis not present

## 2016-07-07 DIAGNOSIS — Y95 Nosocomial condition: Secondary | ICD-10-CM | POA: Diagnosis not present

## 2016-07-07 DIAGNOSIS — N261 Atrophy of kidney (terminal): Secondary | ICD-10-CM | POA: Diagnosis not present

## 2016-07-07 DIAGNOSIS — E1122 Type 2 diabetes mellitus with diabetic chronic kidney disease: Secondary | ICD-10-CM | POA: Diagnosis not present

## 2016-07-07 DIAGNOSIS — J189 Pneumonia, unspecified organism: Secondary | ICD-10-CM | POA: Diagnosis not present

## 2016-07-07 DIAGNOSIS — Z940 Kidney transplant status: Secondary | ICD-10-CM | POA: Diagnosis not present

## 2016-07-08 DIAGNOSIS — Y95 Nosocomial condition: Secondary | ICD-10-CM | POA: Diagnosis not present

## 2016-07-08 DIAGNOSIS — J9601 Acute respiratory failure with hypoxia: Secondary | ICD-10-CM | POA: Diagnosis not present

## 2016-07-08 DIAGNOSIS — I130 Hypertensive heart and chronic kidney disease with heart failure and stage 1 through stage 4 chronic kidney disease, or unspecified chronic kidney disease: Secondary | ICD-10-CM | POA: Diagnosis not present

## 2016-07-08 DIAGNOSIS — E1122 Type 2 diabetes mellitus with diabetic chronic kidney disease: Secondary | ICD-10-CM | POA: Diagnosis not present

## 2016-07-08 DIAGNOSIS — J189 Pneumonia, unspecified organism: Secondary | ICD-10-CM | POA: Diagnosis not present

## 2016-07-08 DIAGNOSIS — Z794 Long term (current) use of insulin: Secondary | ICD-10-CM | POA: Diagnosis not present

## 2016-07-08 DIAGNOSIS — E1121 Type 2 diabetes mellitus with diabetic nephropathy: Secondary | ICD-10-CM | POA: Diagnosis not present

## 2016-07-08 DIAGNOSIS — I129 Hypertensive chronic kidney disease with stage 1 through stage 4 chronic kidney disease, or unspecified chronic kidney disease: Secondary | ICD-10-CM | POA: Diagnosis not present

## 2016-07-09 DIAGNOSIS — I130 Hypertensive heart and chronic kidney disease with heart failure and stage 1 through stage 4 chronic kidney disease, or unspecified chronic kidney disease: Secondary | ICD-10-CM | POA: Diagnosis not present

## 2016-07-09 DIAGNOSIS — I129 Hypertensive chronic kidney disease with stage 1 through stage 4 chronic kidney disease, or unspecified chronic kidney disease: Secondary | ICD-10-CM | POA: Diagnosis not present

## 2016-07-09 DIAGNOSIS — I491 Atrial premature depolarization: Secondary | ICD-10-CM | POA: Diagnosis not present

## 2016-07-09 DIAGNOSIS — Y95 Nosocomial condition: Secondary | ICD-10-CM | POA: Diagnosis not present

## 2016-07-09 DIAGNOSIS — Z794 Long term (current) use of insulin: Secondary | ICD-10-CM | POA: Diagnosis not present

## 2016-07-09 DIAGNOSIS — I517 Cardiomegaly: Secondary | ICD-10-CM | POA: Diagnosis not present

## 2016-07-09 DIAGNOSIS — E1121 Type 2 diabetes mellitus with diabetic nephropathy: Secondary | ICD-10-CM | POA: Diagnosis not present

## 2016-07-09 DIAGNOSIS — E1122 Type 2 diabetes mellitus with diabetic chronic kidney disease: Secondary | ICD-10-CM | POA: Diagnosis not present

## 2016-07-09 DIAGNOSIS — J9601 Acute respiratory failure with hypoxia: Secondary | ICD-10-CM | POA: Diagnosis not present

## 2016-07-09 DIAGNOSIS — J189 Pneumonia, unspecified organism: Secondary | ICD-10-CM | POA: Diagnosis not present

## 2016-07-10 DIAGNOSIS — D899 Disorder involving the immune mechanism, unspecified: Secondary | ICD-10-CM | POA: Diagnosis not present

## 2016-07-10 DIAGNOSIS — Z940 Kidney transplant status: Secondary | ICD-10-CM | POA: Diagnosis not present

## 2016-07-10 DIAGNOSIS — Z992 Dependence on renal dialysis: Secondary | ICD-10-CM | POA: Diagnosis not present

## 2016-07-10 DIAGNOSIS — I120 Hypertensive chronic kidney disease with stage 5 chronic kidney disease or end stage renal disease: Secondary | ICD-10-CM | POA: Diagnosis not present

## 2016-07-10 DIAGNOSIS — I5031 Acute diastolic (congestive) heart failure: Secondary | ICD-10-CM | POA: Diagnosis not present

## 2016-07-10 DIAGNOSIS — I132 Hypertensive heart and chronic kidney disease with heart failure and with stage 5 chronic kidney disease, or end stage renal disease: Secondary | ICD-10-CM | POA: Diagnosis not present

## 2016-07-10 DIAGNOSIS — N186 End stage renal disease: Secondary | ICD-10-CM | POA: Diagnosis not present

## 2016-07-10 DIAGNOSIS — E1122 Type 2 diabetes mellitus with diabetic chronic kidney disease: Secondary | ICD-10-CM | POA: Diagnosis not present

## 2016-07-11 DIAGNOSIS — E1121 Type 2 diabetes mellitus with diabetic nephropathy: Secondary | ICD-10-CM | POA: Diagnosis not present

## 2016-07-11 DIAGNOSIS — I120 Hypertensive chronic kidney disease with stage 5 chronic kidney disease or end stage renal disease: Secondary | ICD-10-CM | POA: Diagnosis not present

## 2016-07-11 DIAGNOSIS — N269 Renal sclerosis, unspecified: Secondary | ICD-10-CM | POA: Diagnosis not present

## 2016-07-11 DIAGNOSIS — I132 Hypertensive heart and chronic kidney disease with heart failure and with stage 5 chronic kidney disease, or end stage renal disease: Secondary | ICD-10-CM | POA: Diagnosis not present

## 2016-07-11 DIAGNOSIS — N261 Atrophy of kidney (terminal): Secondary | ICD-10-CM | POA: Diagnosis not present

## 2016-07-11 DIAGNOSIS — Z940 Kidney transplant status: Secondary | ICD-10-CM | POA: Diagnosis not present

## 2016-07-11 DIAGNOSIS — D638 Anemia in other chronic diseases classified elsewhere: Secondary | ICD-10-CM | POA: Diagnosis not present

## 2016-07-11 DIAGNOSIS — E1122 Type 2 diabetes mellitus with diabetic chronic kidney disease: Secondary | ICD-10-CM | POA: Diagnosis not present

## 2016-07-11 DIAGNOSIS — Z794 Long term (current) use of insulin: Secondary | ICD-10-CM | POA: Diagnosis not present

## 2016-07-11 DIAGNOSIS — N186 End stage renal disease: Secondary | ICD-10-CM | POA: Diagnosis not present

## 2016-07-12 DIAGNOSIS — Z992 Dependence on renal dialysis: Secondary | ICD-10-CM | POA: Diagnosis not present

## 2016-07-12 DIAGNOSIS — Z940 Kidney transplant status: Secondary | ICD-10-CM | POA: Diagnosis not present

## 2016-07-12 DIAGNOSIS — N186 End stage renal disease: Secondary | ICD-10-CM | POA: Diagnosis not present

## 2016-07-12 DIAGNOSIS — I132 Hypertensive heart and chronic kidney disease with heart failure and with stage 5 chronic kidney disease, or end stage renal disease: Secondary | ICD-10-CM | POA: Diagnosis not present

## 2016-07-12 DIAGNOSIS — E1122 Type 2 diabetes mellitus with diabetic chronic kidney disease: Secondary | ICD-10-CM | POA: Diagnosis not present

## 2016-07-13 DIAGNOSIS — D631 Anemia in chronic kidney disease: Secondary | ICD-10-CM | POA: Diagnosis not present

## 2016-07-13 DIAGNOSIS — N186 End stage renal disease: Secondary | ICD-10-CM | POA: Diagnosis not present

## 2016-07-13 DIAGNOSIS — E1129 Type 2 diabetes mellitus with other diabetic kidney complication: Secondary | ICD-10-CM | POA: Diagnosis not present

## 2016-07-13 DIAGNOSIS — I10 Essential (primary) hypertension: Secondary | ICD-10-CM | POA: Diagnosis not present

## 2016-07-14 DIAGNOSIS — I259 Chronic ischemic heart disease, unspecified: Secondary | ICD-10-CM | POA: Diagnosis not present

## 2016-07-14 DIAGNOSIS — E119 Type 2 diabetes mellitus without complications: Secondary | ICD-10-CM | POA: Diagnosis not present

## 2016-07-14 DIAGNOSIS — Z23 Encounter for immunization: Secondary | ICD-10-CM | POA: Diagnosis not present

## 2016-07-14 DIAGNOSIS — D631 Anemia in chronic kidney disease: Secondary | ICD-10-CM | POA: Diagnosis not present

## 2016-07-14 DIAGNOSIS — N2581 Secondary hyperparathyroidism of renal origin: Secondary | ICD-10-CM | POA: Diagnosis not present

## 2016-07-14 DIAGNOSIS — Z794 Long term (current) use of insulin: Secondary | ICD-10-CM | POA: Diagnosis not present

## 2016-07-14 DIAGNOSIS — N186 End stage renal disease: Secondary | ICD-10-CM | POA: Diagnosis not present

## 2016-07-14 DIAGNOSIS — R17 Unspecified jaundice: Secondary | ICD-10-CM | POA: Diagnosis not present

## 2016-07-14 DIAGNOSIS — Z992 Dependence on renal dialysis: Secondary | ICD-10-CM | POA: Diagnosis not present

## 2016-07-17 DIAGNOSIS — N186 End stage renal disease: Secondary | ICD-10-CM | POA: Diagnosis not present

## 2016-07-17 DIAGNOSIS — E878 Other disorders of electrolyte and fluid balance, not elsewhere classified: Secondary | ICD-10-CM | POA: Diagnosis not present

## 2016-07-17 DIAGNOSIS — Z992 Dependence on renal dialysis: Secondary | ICD-10-CM | POA: Diagnosis not present

## 2016-07-17 DIAGNOSIS — N2581 Secondary hyperparathyroidism of renal origin: Secondary | ICD-10-CM | POA: Diagnosis not present

## 2016-07-17 DIAGNOSIS — D631 Anemia in chronic kidney disease: Secondary | ICD-10-CM | POA: Diagnosis not present

## 2016-07-17 DIAGNOSIS — Z23 Encounter for immunization: Secondary | ICD-10-CM | POA: Diagnosis not present

## 2016-07-19 DIAGNOSIS — N186 End stage renal disease: Secondary | ICD-10-CM | POA: Diagnosis not present

## 2016-07-19 DIAGNOSIS — Z992 Dependence on renal dialysis: Secondary | ICD-10-CM | POA: Diagnosis not present

## 2016-07-19 DIAGNOSIS — Z23 Encounter for immunization: Secondary | ICD-10-CM | POA: Diagnosis not present

## 2016-07-21 DIAGNOSIS — Z23 Encounter for immunization: Secondary | ICD-10-CM | POA: Diagnosis not present

## 2016-07-21 DIAGNOSIS — Z992 Dependence on renal dialysis: Secondary | ICD-10-CM | POA: Diagnosis not present

## 2016-07-21 DIAGNOSIS — N186 End stage renal disease: Secondary | ICD-10-CM | POA: Diagnosis not present

## 2016-07-24 DIAGNOSIS — Z992 Dependence on renal dialysis: Secondary | ICD-10-CM | POA: Diagnosis not present

## 2016-07-24 DIAGNOSIS — Z23 Encounter for immunization: Secondary | ICD-10-CM | POA: Diagnosis not present

## 2016-07-24 DIAGNOSIS — N186 End stage renal disease: Secondary | ICD-10-CM | POA: Diagnosis not present

## 2016-07-26 DIAGNOSIS — E11649 Type 2 diabetes mellitus with hypoglycemia without coma: Secondary | ICD-10-CM | POA: Diagnosis not present

## 2016-07-26 DIAGNOSIS — Z992 Dependence on renal dialysis: Secondary | ICD-10-CM | POA: Diagnosis not present

## 2016-07-26 DIAGNOSIS — Z23 Encounter for immunization: Secondary | ICD-10-CM | POA: Diagnosis not present

## 2016-07-26 DIAGNOSIS — D509 Iron deficiency anemia, unspecified: Secondary | ICD-10-CM | POA: Diagnosis not present

## 2016-07-26 DIAGNOSIS — E162 Hypoglycemia, unspecified: Secondary | ICD-10-CM | POA: Diagnosis not present

## 2016-07-26 DIAGNOSIS — N186 End stage renal disease: Secondary | ICD-10-CM | POA: Diagnosis not present

## 2016-07-28 DIAGNOSIS — Z992 Dependence on renal dialysis: Secondary | ICD-10-CM | POA: Diagnosis not present

## 2016-07-28 DIAGNOSIS — N186 End stage renal disease: Secondary | ICD-10-CM | POA: Diagnosis not present

## 2016-07-28 DIAGNOSIS — Z23 Encounter for immunization: Secondary | ICD-10-CM | POA: Diagnosis not present

## 2016-07-31 DIAGNOSIS — N186 End stage renal disease: Secondary | ICD-10-CM | POA: Diagnosis not present

## 2016-07-31 DIAGNOSIS — Z23 Encounter for immunization: Secondary | ICD-10-CM | POA: Diagnosis not present

## 2016-07-31 DIAGNOSIS — Z992 Dependence on renal dialysis: Secondary | ICD-10-CM | POA: Diagnosis not present

## 2016-08-01 DIAGNOSIS — N185 Chronic kidney disease, stage 5: Secondary | ICD-10-CM | POA: Diagnosis not present

## 2016-08-01 DIAGNOSIS — N179 Acute kidney failure, unspecified: Secondary | ICD-10-CM | POA: Diagnosis not present

## 2016-08-02 DIAGNOSIS — Z23 Encounter for immunization: Secondary | ICD-10-CM | POA: Diagnosis not present

## 2016-08-02 DIAGNOSIS — N186 End stage renal disease: Secondary | ICD-10-CM | POA: Diagnosis not present

## 2016-08-02 DIAGNOSIS — Z992 Dependence on renal dialysis: Secondary | ICD-10-CM | POA: Diagnosis not present

## 2016-08-03 DIAGNOSIS — N186 End stage renal disease: Secondary | ICD-10-CM | POA: Diagnosis not present

## 2016-08-03 DIAGNOSIS — Z992 Dependence on renal dialysis: Secondary | ICD-10-CM | POA: Diagnosis not present

## 2016-08-04 DIAGNOSIS — I708 Atherosclerosis of other arteries: Secondary | ICD-10-CM | POA: Diagnosis not present

## 2016-08-04 DIAGNOSIS — Z992 Dependence on renal dialysis: Secondary | ICD-10-CM | POA: Diagnosis not present

## 2016-08-04 DIAGNOSIS — I770 Arteriovenous fistula, acquired: Secondary | ICD-10-CM | POA: Diagnosis not present

## 2016-08-04 DIAGNOSIS — N186 End stage renal disease: Secondary | ICD-10-CM | POA: Insufficient documentation

## 2016-08-07 DIAGNOSIS — Z992 Dependence on renal dialysis: Secondary | ICD-10-CM | POA: Diagnosis not present

## 2016-08-07 DIAGNOSIS — E878 Other disorders of electrolyte and fluid balance, not elsewhere classified: Secondary | ICD-10-CM | POA: Diagnosis not present

## 2016-08-07 DIAGNOSIS — N186 End stage renal disease: Secondary | ICD-10-CM | POA: Diagnosis not present

## 2016-08-08 DIAGNOSIS — R0602 Shortness of breath: Secondary | ICD-10-CM | POA: Diagnosis not present

## 2016-08-08 DIAGNOSIS — Z9225 Personal history of immunosupression therapy: Secondary | ICD-10-CM | POA: Diagnosis not present

## 2016-08-08 DIAGNOSIS — I110 Hypertensive heart disease with heart failure: Secondary | ICD-10-CM | POA: Diagnosis not present

## 2016-08-08 DIAGNOSIS — I444 Left anterior fascicular block: Secondary | ICD-10-CM | POA: Diagnosis not present

## 2016-08-08 DIAGNOSIS — K219 Gastro-esophageal reflux disease without esophagitis: Secondary | ICD-10-CM | POA: Diagnosis not present

## 2016-08-08 DIAGNOSIS — R9431 Abnormal electrocardiogram [ECG] [EKG]: Secondary | ICD-10-CM | POA: Diagnosis not present

## 2016-08-08 DIAGNOSIS — Z452 Encounter for adjustment and management of vascular access device: Secondary | ICD-10-CM | POA: Diagnosis not present

## 2016-08-08 DIAGNOSIS — J9601 Acute respiratory failure with hypoxia: Secondary | ICD-10-CM | POA: Diagnosis not present

## 2016-08-08 DIAGNOSIS — I4581 Long QT syndrome: Secondary | ICD-10-CM | POA: Diagnosis not present

## 2016-08-08 DIAGNOSIS — I491 Atrial premature depolarization: Secondary | ICD-10-CM | POA: Diagnosis not present

## 2016-08-08 DIAGNOSIS — J159 Unspecified bacterial pneumonia: Secondary | ICD-10-CM | POA: Diagnosis not present

## 2016-08-08 DIAGNOSIS — E1122 Type 2 diabetes mellitus with diabetic chronic kidney disease: Secondary | ICD-10-CM | POA: Diagnosis not present

## 2016-08-08 DIAGNOSIS — I132 Hypertensive heart and chronic kidney disease with heart failure and with stage 5 chronic kidney disease, or end stage renal disease: Secondary | ICD-10-CM | POA: Diagnosis not present

## 2016-08-08 DIAGNOSIS — Y95 Nosocomial condition: Secondary | ICD-10-CM | POA: Diagnosis not present

## 2016-08-08 DIAGNOSIS — Z940 Kidney transplant status: Secondary | ICD-10-CM | POA: Diagnosis not present

## 2016-08-08 DIAGNOSIS — I248 Other forms of acute ischemic heart disease: Secondary | ICD-10-CM | POA: Diagnosis not present

## 2016-08-08 DIAGNOSIS — Z992 Dependence on renal dialysis: Secondary | ICD-10-CM | POA: Diagnosis not present

## 2016-08-08 DIAGNOSIS — N184 Chronic kidney disease, stage 4 (severe): Secondary | ICD-10-CM | POA: Diagnosis not present

## 2016-08-08 DIAGNOSIS — Z794 Long term (current) use of insulin: Secondary | ICD-10-CM | POA: Diagnosis not present

## 2016-08-08 DIAGNOSIS — I493 Ventricular premature depolarization: Secondary | ICD-10-CM | POA: Diagnosis not present

## 2016-08-08 DIAGNOSIS — I130 Hypertensive heart and chronic kidney disease with heart failure and stage 1 through stage 4 chronic kidney disease, or unspecified chronic kidney disease: Secondary | ICD-10-CM | POA: Diagnosis not present

## 2016-08-08 DIAGNOSIS — I509 Heart failure, unspecified: Secondary | ICD-10-CM | POA: Diagnosis not present

## 2016-08-08 DIAGNOSIS — I517 Cardiomegaly: Secondary | ICD-10-CM | POA: Diagnosis not present

## 2016-08-08 DIAGNOSIS — Z91048 Other nonmedicinal substance allergy status: Secondary | ICD-10-CM | POA: Diagnosis not present

## 2016-08-08 DIAGNOSIS — D631 Anemia in chronic kidney disease: Secondary | ICD-10-CM | POA: Diagnosis not present

## 2016-08-08 DIAGNOSIS — R001 Bradycardia, unspecified: Secondary | ICD-10-CM | POA: Diagnosis not present

## 2016-08-08 DIAGNOSIS — N186 End stage renal disease: Secondary | ICD-10-CM | POA: Diagnosis not present

## 2016-08-08 DIAGNOSIS — I5032 Chronic diastolic (congestive) heart failure: Secondary | ICD-10-CM | POA: Diagnosis not present

## 2016-08-08 DIAGNOSIS — E785 Hyperlipidemia, unspecified: Secondary | ICD-10-CM | POA: Diagnosis not present

## 2016-08-08 DIAGNOSIS — R918 Other nonspecific abnormal finding of lung field: Secondary | ICD-10-CM | POA: Diagnosis not present

## 2016-08-09 DIAGNOSIS — Z9225 Personal history of immunosupression therapy: Secondary | ICD-10-CM | POA: Diagnosis not present

## 2016-08-09 DIAGNOSIS — Z940 Kidney transplant status: Secondary | ICD-10-CM | POA: Diagnosis not present

## 2016-08-09 DIAGNOSIS — J159 Unspecified bacterial pneumonia: Secondary | ICD-10-CM | POA: Diagnosis not present

## 2016-08-09 DIAGNOSIS — J9601 Acute respiratory failure with hypoxia: Secondary | ICD-10-CM | POA: Diagnosis not present

## 2016-08-09 DIAGNOSIS — N186 End stage renal disease: Secondary | ICD-10-CM | POA: Diagnosis not present

## 2016-08-09 DIAGNOSIS — I517 Cardiomegaly: Secondary | ICD-10-CM | POA: Diagnosis not present

## 2016-08-09 DIAGNOSIS — Z992 Dependence on renal dialysis: Secondary | ICD-10-CM | POA: Diagnosis not present

## 2016-08-09 DIAGNOSIS — R9431 Abnormal electrocardiogram [ECG] [EKG]: Secondary | ICD-10-CM | POA: Diagnosis not present

## 2016-08-10 DIAGNOSIS — Z794 Long term (current) use of insulin: Secondary | ICD-10-CM | POA: Diagnosis not present

## 2016-08-10 DIAGNOSIS — Z9225 Personal history of immunosupression therapy: Secondary | ICD-10-CM | POA: Diagnosis not present

## 2016-08-10 DIAGNOSIS — I132 Hypertensive heart and chronic kidney disease with heart failure and with stage 5 chronic kidney disease, or end stage renal disease: Secondary | ICD-10-CM | POA: Diagnosis not present

## 2016-08-10 DIAGNOSIS — J159 Unspecified bacterial pneumonia: Secondary | ICD-10-CM | POA: Diagnosis not present

## 2016-08-10 DIAGNOSIS — I493 Ventricular premature depolarization: Secondary | ICD-10-CM | POA: Diagnosis not present

## 2016-08-10 DIAGNOSIS — I517 Cardiomegaly: Secondary | ICD-10-CM | POA: Diagnosis not present

## 2016-08-10 DIAGNOSIS — Z940 Kidney transplant status: Secondary | ICD-10-CM | POA: Diagnosis not present

## 2016-08-10 DIAGNOSIS — J9601 Acute respiratory failure with hypoxia: Secondary | ICD-10-CM | POA: Diagnosis not present

## 2016-08-10 DIAGNOSIS — E1122 Type 2 diabetes mellitus with diabetic chronic kidney disease: Secondary | ICD-10-CM | POA: Diagnosis not present

## 2016-08-10 DIAGNOSIS — N186 End stage renal disease: Secondary | ICD-10-CM | POA: Diagnosis not present

## 2016-08-11 DIAGNOSIS — N184 Chronic kidney disease, stage 4 (severe): Secondary | ICD-10-CM | POA: Diagnosis not present

## 2016-08-11 DIAGNOSIS — I130 Hypertensive heart and chronic kidney disease with heart failure and stage 1 through stage 4 chronic kidney disease, or unspecified chronic kidney disease: Secondary | ICD-10-CM | POA: Diagnosis not present

## 2016-08-11 DIAGNOSIS — Z794 Long term (current) use of insulin: Secondary | ICD-10-CM | POA: Diagnosis not present

## 2016-08-11 DIAGNOSIS — J9601 Acute respiratory failure with hypoxia: Secondary | ICD-10-CM | POA: Diagnosis not present

## 2016-08-11 DIAGNOSIS — I132 Hypertensive heart and chronic kidney disease with heart failure and with stage 5 chronic kidney disease, or end stage renal disease: Secondary | ICD-10-CM | POA: Diagnosis not present

## 2016-08-11 DIAGNOSIS — E1122 Type 2 diabetes mellitus with diabetic chronic kidney disease: Secondary | ICD-10-CM | POA: Diagnosis not present

## 2016-08-11 DIAGNOSIS — N186 End stage renal disease: Secondary | ICD-10-CM | POA: Diagnosis not present

## 2016-08-14 DIAGNOSIS — N186 End stage renal disease: Secondary | ICD-10-CM | POA: Diagnosis not present

## 2016-08-14 DIAGNOSIS — Z992 Dependence on renal dialysis: Secondary | ICD-10-CM | POA: Diagnosis not present

## 2016-08-14 DIAGNOSIS — D631 Anemia in chronic kidney disease: Secondary | ICD-10-CM | POA: Diagnosis not present

## 2016-08-14 DIAGNOSIS — N2581 Secondary hyperparathyroidism of renal origin: Secondary | ICD-10-CM | POA: Diagnosis not present

## 2016-08-14 DIAGNOSIS — E878 Other disorders of electrolyte and fluid balance, not elsewhere classified: Secondary | ICD-10-CM | POA: Diagnosis not present

## 2016-08-16 DIAGNOSIS — Z992 Dependence on renal dialysis: Secondary | ICD-10-CM | POA: Diagnosis not present

## 2016-08-16 DIAGNOSIS — N186 End stage renal disease: Secondary | ICD-10-CM | POA: Diagnosis not present

## 2016-08-18 DIAGNOSIS — N186 End stage renal disease: Secondary | ICD-10-CM | POA: Diagnosis not present

## 2016-08-18 DIAGNOSIS — Z992 Dependence on renal dialysis: Secondary | ICD-10-CM | POA: Diagnosis not present

## 2016-08-21 DIAGNOSIS — N186 End stage renal disease: Secondary | ICD-10-CM | POA: Diagnosis not present

## 2016-08-21 DIAGNOSIS — E878 Other disorders of electrolyte and fluid balance, not elsewhere classified: Secondary | ICD-10-CM | POA: Diagnosis not present

## 2016-08-21 DIAGNOSIS — Z992 Dependence on renal dialysis: Secondary | ICD-10-CM | POA: Diagnosis not present

## 2016-08-22 DIAGNOSIS — N186 End stage renal disease: Secondary | ICD-10-CM | POA: Diagnosis not present

## 2016-08-22 DIAGNOSIS — Z992 Dependence on renal dialysis: Secondary | ICD-10-CM | POA: Diagnosis not present

## 2016-08-23 DIAGNOSIS — Z794 Long term (current) use of insulin: Secondary | ICD-10-CM | POA: Diagnosis not present

## 2016-08-23 DIAGNOSIS — Y838 Other surgical procedures as the cause of abnormal reaction of the patient, or of later complication, without mention of misadventure at the time of the procedure: Secondary | ICD-10-CM | POA: Diagnosis not present

## 2016-08-23 DIAGNOSIS — Z992 Dependence on renal dialysis: Secondary | ICD-10-CM | POA: Diagnosis not present

## 2016-08-23 DIAGNOSIS — I5032 Chronic diastolic (congestive) heart failure: Secondary | ICD-10-CM | POA: Diagnosis not present

## 2016-08-23 DIAGNOSIS — I132 Hypertensive heart and chronic kidney disease with heart failure and with stage 5 chronic kidney disease, or end stage renal disease: Secondary | ICD-10-CM | POA: Diagnosis not present

## 2016-08-23 DIAGNOSIS — N189 Chronic kidney disease, unspecified: Secondary | ICD-10-CM | POA: Diagnosis not present

## 2016-08-23 DIAGNOSIS — N186 End stage renal disease: Secondary | ICD-10-CM | POA: Diagnosis not present

## 2016-08-23 DIAGNOSIS — T82898A Other specified complication of vascular prosthetic devices, implants and grafts, initial encounter: Secondary | ICD-10-CM | POA: Diagnosis not present

## 2016-08-23 DIAGNOSIS — D638 Anemia in other chronic diseases classified elsewhere: Secondary | ICD-10-CM | POA: Diagnosis not present

## 2016-08-23 DIAGNOSIS — E785 Hyperlipidemia, unspecified: Secondary | ICD-10-CM | POA: Diagnosis not present

## 2016-08-23 DIAGNOSIS — T82858A Stenosis of vascular prosthetic devices, implants and grafts, initial encounter: Secondary | ICD-10-CM | POA: Diagnosis not present

## 2016-08-25 DIAGNOSIS — N186 End stage renal disease: Secondary | ICD-10-CM | POA: Diagnosis not present

## 2016-08-25 DIAGNOSIS — Z992 Dependence on renal dialysis: Secondary | ICD-10-CM | POA: Diagnosis not present

## 2016-08-28 DIAGNOSIS — Z794 Long term (current) use of insulin: Secondary | ICD-10-CM | POA: Diagnosis not present

## 2016-08-28 DIAGNOSIS — E11649 Type 2 diabetes mellitus with hypoglycemia without coma: Secondary | ICD-10-CM | POA: Diagnosis not present

## 2016-08-28 DIAGNOSIS — Z992 Dependence on renal dialysis: Secondary | ICD-10-CM | POA: Diagnosis not present

## 2016-08-28 DIAGNOSIS — E162 Hypoglycemia, unspecified: Secondary | ICD-10-CM | POA: Diagnosis not present

## 2016-08-28 DIAGNOSIS — E119 Type 2 diabetes mellitus without complications: Secondary | ICD-10-CM | POA: Diagnosis not present

## 2016-08-28 DIAGNOSIS — D509 Iron deficiency anemia, unspecified: Secondary | ICD-10-CM | POA: Diagnosis not present

## 2016-08-28 DIAGNOSIS — N186 End stage renal disease: Secondary | ICD-10-CM | POA: Diagnosis not present

## 2016-08-28 DIAGNOSIS — I259 Chronic ischemic heart disease, unspecified: Secondary | ICD-10-CM | POA: Diagnosis not present

## 2016-08-30 DIAGNOSIS — N186 End stage renal disease: Secondary | ICD-10-CM | POA: Diagnosis not present

## 2016-08-30 DIAGNOSIS — Z992 Dependence on renal dialysis: Secondary | ICD-10-CM | POA: Diagnosis not present

## 2016-09-01 DIAGNOSIS — N186 End stage renal disease: Secondary | ICD-10-CM | POA: Diagnosis not present

## 2016-09-01 DIAGNOSIS — Z992 Dependence on renal dialysis: Secondary | ICD-10-CM | POA: Diagnosis not present

## 2016-09-02 DIAGNOSIS — Z992 Dependence on renal dialysis: Secondary | ICD-10-CM | POA: Diagnosis not present

## 2016-09-02 DIAGNOSIS — N186 End stage renal disease: Secondary | ICD-10-CM | POA: Diagnosis not present

## 2016-09-04 DIAGNOSIS — Z23 Encounter for immunization: Secondary | ICD-10-CM | POA: Diagnosis not present

## 2016-09-04 DIAGNOSIS — N186 End stage renal disease: Secondary | ICD-10-CM | POA: Diagnosis not present

## 2016-09-04 DIAGNOSIS — Z992 Dependence on renal dialysis: Secondary | ICD-10-CM | POA: Diagnosis not present

## 2016-09-04 DIAGNOSIS — E878 Other disorders of electrolyte and fluid balance, not elsewhere classified: Secondary | ICD-10-CM | POA: Diagnosis not present

## 2016-09-06 DIAGNOSIS — Z23 Encounter for immunization: Secondary | ICD-10-CM | POA: Diagnosis not present

## 2016-09-06 DIAGNOSIS — N186 End stage renal disease: Secondary | ICD-10-CM | POA: Diagnosis not present

## 2016-09-06 DIAGNOSIS — Z992 Dependence on renal dialysis: Secondary | ICD-10-CM | POA: Diagnosis not present

## 2016-09-07 DIAGNOSIS — N186 End stage renal disease: Secondary | ICD-10-CM | POA: Diagnosis not present

## 2016-09-07 DIAGNOSIS — Z940 Kidney transplant status: Secondary | ICD-10-CM | POA: Diagnosis not present

## 2016-09-07 DIAGNOSIS — Z992 Dependence on renal dialysis: Secondary | ICD-10-CM | POA: Diagnosis not present

## 2016-09-07 DIAGNOSIS — I770 Arteriovenous fistula, acquired: Secondary | ICD-10-CM | POA: Diagnosis not present

## 2016-09-08 DIAGNOSIS — Z992 Dependence on renal dialysis: Secondary | ICD-10-CM | POA: Diagnosis not present

## 2016-09-08 DIAGNOSIS — N186 End stage renal disease: Secondary | ICD-10-CM | POA: Diagnosis not present

## 2016-09-08 DIAGNOSIS — Z23 Encounter for immunization: Secondary | ICD-10-CM | POA: Diagnosis not present

## 2016-09-11 DIAGNOSIS — N2581 Secondary hyperparathyroidism of renal origin: Secondary | ICD-10-CM | POA: Diagnosis not present

## 2016-09-11 DIAGNOSIS — D631 Anemia in chronic kidney disease: Secondary | ICD-10-CM | POA: Diagnosis not present

## 2016-09-11 DIAGNOSIS — Z992 Dependence on renal dialysis: Secondary | ICD-10-CM | POA: Diagnosis not present

## 2016-09-11 DIAGNOSIS — E878 Other disorders of electrolyte and fluid balance, not elsewhere classified: Secondary | ICD-10-CM | POA: Diagnosis not present

## 2016-09-11 DIAGNOSIS — Z23 Encounter for immunization: Secondary | ICD-10-CM | POA: Diagnosis not present

## 2016-09-11 DIAGNOSIS — N186 End stage renal disease: Secondary | ICD-10-CM | POA: Diagnosis not present

## 2016-09-13 DIAGNOSIS — N186 End stage renal disease: Secondary | ICD-10-CM | POA: Diagnosis not present

## 2016-09-13 DIAGNOSIS — Z23 Encounter for immunization: Secondary | ICD-10-CM | POA: Diagnosis not present

## 2016-09-13 DIAGNOSIS — Z992 Dependence on renal dialysis: Secondary | ICD-10-CM | POA: Diagnosis not present

## 2016-09-15 DIAGNOSIS — N186 End stage renal disease: Secondary | ICD-10-CM | POA: Diagnosis not present

## 2016-09-15 DIAGNOSIS — Z992 Dependence on renal dialysis: Secondary | ICD-10-CM | POA: Diagnosis not present

## 2016-09-15 DIAGNOSIS — Z23 Encounter for immunization: Secondary | ICD-10-CM | POA: Diagnosis not present

## 2016-09-18 DIAGNOSIS — Z23 Encounter for immunization: Secondary | ICD-10-CM | POA: Diagnosis not present

## 2016-09-18 DIAGNOSIS — Z992 Dependence on renal dialysis: Secondary | ICD-10-CM | POA: Diagnosis not present

## 2016-09-18 DIAGNOSIS — E878 Other disorders of electrolyte and fluid balance, not elsewhere classified: Secondary | ICD-10-CM | POA: Diagnosis not present

## 2016-09-18 DIAGNOSIS — N186 End stage renal disease: Secondary | ICD-10-CM | POA: Diagnosis not present

## 2016-09-19 DIAGNOSIS — D899 Disorder involving the immune mechanism, unspecified: Secondary | ICD-10-CM | POA: Diagnosis not present

## 2016-09-19 DIAGNOSIS — Z940 Kidney transplant status: Secondary | ICD-10-CM | POA: Diagnosis not present

## 2016-09-20 DIAGNOSIS — N186 End stage renal disease: Secondary | ICD-10-CM | POA: Diagnosis not present

## 2016-09-20 DIAGNOSIS — Z992 Dependence on renal dialysis: Secondary | ICD-10-CM | POA: Diagnosis not present

## 2016-09-20 DIAGNOSIS — Z23 Encounter for immunization: Secondary | ICD-10-CM | POA: Diagnosis not present

## 2016-09-22 DIAGNOSIS — Z23 Encounter for immunization: Secondary | ICD-10-CM | POA: Diagnosis not present

## 2016-09-22 DIAGNOSIS — Z992 Dependence on renal dialysis: Secondary | ICD-10-CM | POA: Diagnosis not present

## 2016-09-22 DIAGNOSIS — N186 End stage renal disease: Secondary | ICD-10-CM | POA: Diagnosis not present

## 2016-09-25 DIAGNOSIS — Z992 Dependence on renal dialysis: Secondary | ICD-10-CM | POA: Diagnosis not present

## 2016-09-25 DIAGNOSIS — N2581 Secondary hyperparathyroidism of renal origin: Secondary | ICD-10-CM | POA: Diagnosis not present

## 2016-09-25 DIAGNOSIS — N186 End stage renal disease: Secondary | ICD-10-CM | POA: Diagnosis not present

## 2016-09-25 DIAGNOSIS — E11649 Type 2 diabetes mellitus with hypoglycemia without coma: Secondary | ICD-10-CM | POA: Diagnosis not present

## 2016-09-25 DIAGNOSIS — D509 Iron deficiency anemia, unspecified: Secondary | ICD-10-CM | POA: Diagnosis not present

## 2016-09-25 DIAGNOSIS — E162 Hypoglycemia, unspecified: Secondary | ICD-10-CM | POA: Diagnosis not present

## 2016-09-25 DIAGNOSIS — Z23 Encounter for immunization: Secondary | ICD-10-CM | POA: Diagnosis not present

## 2016-09-27 DIAGNOSIS — Z992 Dependence on renal dialysis: Secondary | ICD-10-CM | POA: Diagnosis not present

## 2016-09-27 DIAGNOSIS — N186 End stage renal disease: Secondary | ICD-10-CM | POA: Diagnosis not present

## 2016-09-27 DIAGNOSIS — Z23 Encounter for immunization: Secondary | ICD-10-CM | POA: Diagnosis not present

## 2016-09-28 DIAGNOSIS — E1122 Type 2 diabetes mellitus with diabetic chronic kidney disease: Secondary | ICD-10-CM | POA: Diagnosis not present

## 2016-09-28 DIAGNOSIS — N186 End stage renal disease: Secondary | ICD-10-CM | POA: Diagnosis not present

## 2016-09-28 DIAGNOSIS — I083 Combined rheumatic disorders of mitral, aortic and tricuspid valves: Secondary | ICD-10-CM | POA: Diagnosis not present

## 2016-09-28 DIAGNOSIS — T8612 Kidney transplant failure: Secondary | ICD-10-CM | POA: Diagnosis not present

## 2016-09-28 DIAGNOSIS — E785 Hyperlipidemia, unspecified: Secondary | ICD-10-CM | POA: Diagnosis not present

## 2016-09-28 DIAGNOSIS — R197 Diarrhea, unspecified: Secondary | ICD-10-CM | POA: Diagnosis not present

## 2016-09-28 DIAGNOSIS — J189 Pneumonia, unspecified organism: Secondary | ICD-10-CM | POA: Diagnosis not present

## 2016-09-28 DIAGNOSIS — I130 Hypertensive heart and chronic kidney disease with heart failure and stage 1 through stage 4 chronic kidney disease, or unspecified chronic kidney disease: Secondary | ICD-10-CM | POA: Diagnosis not present

## 2016-09-28 DIAGNOSIS — R918 Other nonspecific abnormal finding of lung field: Secondary | ICD-10-CM | POA: Diagnosis not present

## 2016-09-28 DIAGNOSIS — A419 Sepsis, unspecified organism: Secondary | ICD-10-CM | POA: Diagnosis not present

## 2016-09-28 DIAGNOSIS — J90 Pleural effusion, not elsewhere classified: Secondary | ICD-10-CM | POA: Diagnosis not present

## 2016-09-28 DIAGNOSIS — K219 Gastro-esophageal reflux disease without esophagitis: Secondary | ICD-10-CM | POA: Diagnosis not present

## 2016-09-28 DIAGNOSIS — D631 Anemia in chronic kidney disease: Secondary | ICD-10-CM | POA: Diagnosis not present

## 2016-09-28 DIAGNOSIS — N184 Chronic kidney disease, stage 4 (severe): Secondary | ICD-10-CM | POA: Diagnosis not present

## 2016-09-28 DIAGNOSIS — I272 Pulmonary hypertension, unspecified: Secondary | ICD-10-CM | POA: Diagnosis not present

## 2016-09-28 DIAGNOSIS — N185 Chronic kidney disease, stage 5: Secondary | ICD-10-CM | POA: Diagnosis not present

## 2016-09-28 DIAGNOSIS — I132 Hypertensive heart and chronic kidney disease with heart failure and with stage 5 chronic kidney disease, or end stage renal disease: Secondary | ICD-10-CM | POA: Diagnosis not present

## 2016-09-28 DIAGNOSIS — I5032 Chronic diastolic (congestive) heart failure: Secondary | ICD-10-CM | POA: Diagnosis not present

## 2016-09-28 DIAGNOSIS — I959 Hypotension, unspecified: Secondary | ICD-10-CM | POA: Diagnosis not present

## 2016-09-28 DIAGNOSIS — R112 Nausea with vomiting, unspecified: Secondary | ICD-10-CM | POA: Diagnosis not present

## 2016-09-28 DIAGNOSIS — E1121 Type 2 diabetes mellitus with diabetic nephropathy: Secondary | ICD-10-CM | POA: Diagnosis not present

## 2016-09-28 DIAGNOSIS — Z794 Long term (current) use of insulin: Secondary | ICD-10-CM | POA: Diagnosis not present

## 2016-09-28 DIAGNOSIS — N059 Unspecified nephritic syndrome with unspecified morphologic changes: Secondary | ICD-10-CM | POA: Diagnosis not present

## 2016-09-28 DIAGNOSIS — R509 Fever, unspecified: Secondary | ICD-10-CM | POA: Diagnosis not present

## 2016-09-29 DIAGNOSIS — N186 End stage renal disease: Secondary | ICD-10-CM | POA: Diagnosis not present

## 2016-09-30 DIAGNOSIS — E1122 Type 2 diabetes mellitus with diabetic chronic kidney disease: Secondary | ICD-10-CM | POA: Diagnosis not present

## 2016-09-30 DIAGNOSIS — I130 Hypertensive heart and chronic kidney disease with heart failure and stage 1 through stage 4 chronic kidney disease, or unspecified chronic kidney disease: Secondary | ICD-10-CM | POA: Diagnosis not present

## 2016-09-30 DIAGNOSIS — Z794 Long term (current) use of insulin: Secondary | ICD-10-CM | POA: Diagnosis not present

## 2016-09-30 DIAGNOSIS — N184 Chronic kidney disease, stage 4 (severe): Secondary | ICD-10-CM | POA: Diagnosis not present

## 2016-10-01 DIAGNOSIS — Z794 Long term (current) use of insulin: Secondary | ICD-10-CM | POA: Diagnosis not present

## 2016-10-01 DIAGNOSIS — N185 Chronic kidney disease, stage 5: Secondary | ICD-10-CM | POA: Diagnosis not present

## 2016-10-01 DIAGNOSIS — I132 Hypertensive heart and chronic kidney disease with heart failure and with stage 5 chronic kidney disease, or end stage renal disease: Secondary | ICD-10-CM | POA: Diagnosis not present

## 2016-10-01 DIAGNOSIS — E1122 Type 2 diabetes mellitus with diabetic chronic kidney disease: Secondary | ICD-10-CM | POA: Diagnosis not present

## 2016-10-02 DIAGNOSIS — I132 Hypertensive heart and chronic kidney disease with heart failure and with stage 5 chronic kidney disease, or end stage renal disease: Secondary | ICD-10-CM | POA: Diagnosis not present

## 2016-10-02 DIAGNOSIS — E1122 Type 2 diabetes mellitus with diabetic chronic kidney disease: Secondary | ICD-10-CM | POA: Diagnosis not present

## 2016-10-02 DIAGNOSIS — E1121 Type 2 diabetes mellitus with diabetic nephropathy: Secondary | ICD-10-CM | POA: Diagnosis not present

## 2016-10-02 DIAGNOSIS — Z794 Long term (current) use of insulin: Secondary | ICD-10-CM | POA: Diagnosis not present

## 2016-10-03 DIAGNOSIS — Z992 Dependence on renal dialysis: Secondary | ICD-10-CM | POA: Diagnosis not present

## 2016-10-03 DIAGNOSIS — N186 End stage renal disease: Secondary | ICD-10-CM | POA: Diagnosis not present

## 2016-10-04 DIAGNOSIS — E878 Other disorders of electrolyte and fluid balance, not elsewhere classified: Secondary | ICD-10-CM | POA: Diagnosis not present

## 2016-10-04 DIAGNOSIS — N186 End stage renal disease: Secondary | ICD-10-CM | POA: Diagnosis not present

## 2016-10-04 DIAGNOSIS — Z23 Encounter for immunization: Secondary | ICD-10-CM | POA: Diagnosis not present

## 2016-10-04 DIAGNOSIS — Z992 Dependence on renal dialysis: Secondary | ICD-10-CM | POA: Diagnosis not present

## 2016-10-06 DIAGNOSIS — Z992 Dependence on renal dialysis: Secondary | ICD-10-CM | POA: Diagnosis not present

## 2016-10-06 DIAGNOSIS — N186 End stage renal disease: Secondary | ICD-10-CM | POA: Diagnosis not present

## 2016-10-06 DIAGNOSIS — Z23 Encounter for immunization: Secondary | ICD-10-CM | POA: Diagnosis not present

## 2016-10-09 DIAGNOSIS — Z23 Encounter for immunization: Secondary | ICD-10-CM | POA: Diagnosis not present

## 2016-10-09 DIAGNOSIS — N186 End stage renal disease: Secondary | ICD-10-CM | POA: Diagnosis not present

## 2016-10-09 DIAGNOSIS — Z992 Dependence on renal dialysis: Secondary | ICD-10-CM | POA: Diagnosis not present

## 2016-10-09 DIAGNOSIS — E878 Other disorders of electrolyte and fluid balance, not elsewhere classified: Secondary | ICD-10-CM | POA: Diagnosis not present

## 2016-10-11 DIAGNOSIS — Z992 Dependence on renal dialysis: Secondary | ICD-10-CM | POA: Diagnosis not present

## 2016-10-11 DIAGNOSIS — N186 End stage renal disease: Secondary | ICD-10-CM | POA: Diagnosis not present

## 2016-10-11 DIAGNOSIS — Z23 Encounter for immunization: Secondary | ICD-10-CM | POA: Diagnosis not present

## 2016-10-13 DIAGNOSIS — N186 End stage renal disease: Secondary | ICD-10-CM | POA: Diagnosis not present

## 2016-10-13 DIAGNOSIS — Z23 Encounter for immunization: Secondary | ICD-10-CM | POA: Diagnosis not present

## 2016-10-13 DIAGNOSIS — Z992 Dependence on renal dialysis: Secondary | ICD-10-CM | POA: Diagnosis not present

## 2016-10-16 DIAGNOSIS — D631 Anemia in chronic kidney disease: Secondary | ICD-10-CM | POA: Diagnosis not present

## 2016-10-16 DIAGNOSIS — N186 End stage renal disease: Secondary | ICD-10-CM | POA: Diagnosis not present

## 2016-10-16 DIAGNOSIS — E878 Other disorders of electrolyte and fluid balance, not elsewhere classified: Secondary | ICD-10-CM | POA: Diagnosis not present

## 2016-10-16 DIAGNOSIS — Z992 Dependence on renal dialysis: Secondary | ICD-10-CM | POA: Diagnosis not present

## 2016-10-16 DIAGNOSIS — Z23 Encounter for immunization: Secondary | ICD-10-CM | POA: Diagnosis not present

## 2016-10-16 DIAGNOSIS — N2581 Secondary hyperparathyroidism of renal origin: Secondary | ICD-10-CM | POA: Diagnosis not present

## 2016-10-18 DIAGNOSIS — Z23 Encounter for immunization: Secondary | ICD-10-CM | POA: Diagnosis not present

## 2016-10-18 DIAGNOSIS — N186 End stage renal disease: Secondary | ICD-10-CM | POA: Diagnosis not present

## 2016-10-18 DIAGNOSIS — Z992 Dependence on renal dialysis: Secondary | ICD-10-CM | POA: Diagnosis not present

## 2016-10-20 DIAGNOSIS — Z992 Dependence on renal dialysis: Secondary | ICD-10-CM | POA: Diagnosis not present

## 2016-10-20 DIAGNOSIS — Z23 Encounter for immunization: Secondary | ICD-10-CM | POA: Diagnosis not present

## 2016-10-20 DIAGNOSIS — N186 End stage renal disease: Secondary | ICD-10-CM | POA: Diagnosis not present

## 2016-10-23 DIAGNOSIS — Z992 Dependence on renal dialysis: Secondary | ICD-10-CM | POA: Diagnosis not present

## 2016-10-23 DIAGNOSIS — Z23 Encounter for immunization: Secondary | ICD-10-CM | POA: Diagnosis not present

## 2016-10-23 DIAGNOSIS — N186 End stage renal disease: Secondary | ICD-10-CM | POA: Diagnosis not present

## 2016-10-25 DIAGNOSIS — Z23 Encounter for immunization: Secondary | ICD-10-CM | POA: Diagnosis not present

## 2016-10-25 DIAGNOSIS — Z992 Dependence on renal dialysis: Secondary | ICD-10-CM | POA: Diagnosis not present

## 2016-10-25 DIAGNOSIS — N186 End stage renal disease: Secondary | ICD-10-CM | POA: Diagnosis not present

## 2016-10-27 DIAGNOSIS — Z23 Encounter for immunization: Secondary | ICD-10-CM | POA: Diagnosis not present

## 2016-10-27 DIAGNOSIS — N186 End stage renal disease: Secondary | ICD-10-CM | POA: Diagnosis not present

## 2016-10-27 DIAGNOSIS — Z992 Dependence on renal dialysis: Secondary | ICD-10-CM | POA: Diagnosis not present

## 2016-10-30 ENCOUNTER — Other Ambulatory Visit (HOSPITAL_COMMUNITY): Payer: Self-pay | Admitting: Nephrology

## 2016-10-30 DIAGNOSIS — E162 Hypoglycemia, unspecified: Secondary | ICD-10-CM | POA: Diagnosis not present

## 2016-10-30 DIAGNOSIS — N186 End stage renal disease: Secondary | ICD-10-CM | POA: Diagnosis not present

## 2016-10-30 DIAGNOSIS — Z992 Dependence on renal dialysis: Secondary | ICD-10-CM

## 2016-10-30 DIAGNOSIS — E11649 Type 2 diabetes mellitus with hypoglycemia without coma: Secondary | ICD-10-CM | POA: Diagnosis not present

## 2016-10-30 DIAGNOSIS — Z23 Encounter for immunization: Secondary | ICD-10-CM | POA: Diagnosis not present

## 2016-10-31 ENCOUNTER — Other Ambulatory Visit: Payer: Self-pay | Admitting: Student

## 2016-11-01 ENCOUNTER — Other Ambulatory Visit: Payer: Self-pay | Admitting: Student

## 2016-11-01 DIAGNOSIS — Z992 Dependence on renal dialysis: Secondary | ICD-10-CM | POA: Diagnosis not present

## 2016-11-01 DIAGNOSIS — Z23 Encounter for immunization: Secondary | ICD-10-CM | POA: Diagnosis not present

## 2016-11-01 DIAGNOSIS — N186 End stage renal disease: Secondary | ICD-10-CM | POA: Diagnosis not present

## 2016-11-02 ENCOUNTER — Encounter (HOSPITAL_COMMUNITY): Payer: Self-pay | Admitting: Radiology

## 2016-11-02 ENCOUNTER — Ambulatory Visit (HOSPITAL_COMMUNITY)
Admission: RE | Admit: 2016-11-02 | Discharge: 2016-11-02 | Disposition: A | Discharge status: Home / Self Care | Payer: BLUE CROSS/BLUE SHIELD | Source: Ambulatory Visit | Attending: Nephrology | Admitting: Nephrology

## 2016-11-02 DIAGNOSIS — Z4901 Encounter for fitting and adjustment of extracorporeal dialysis catheter: Secondary | ICD-10-CM | POA: Insufficient documentation

## 2016-11-02 DIAGNOSIS — Z992 Dependence on renal dialysis: Secondary | ICD-10-CM

## 2016-11-02 DIAGNOSIS — N186 End stage renal disease: Secondary | ICD-10-CM | POA: Diagnosis not present

## 2016-11-02 HISTORY — PX: IR REMOVAL TUN CV CATH W/O FL: IMG2289

## 2016-11-02 MED ORDER — CHLORHEXIDINE GLUCONATE 4 % EX LIQD
CUTANEOUS | Status: AC
  Filled 2016-11-02: qty 15

## 2016-11-02 MED ORDER — LIDOCAINE HCL (PF) 1 % IJ SOLN
INTRAMUSCULAR | Status: AC
  Filled 2016-11-02: qty 30

## 2016-11-02 MED ORDER — LIDOCAINE HCL 1 % IJ SOLN
INTRAMUSCULAR | Status: DC | PRN
  Administered 2016-11-02: 5 mL

## 2016-11-02 NOTE — Procedures (Signed)
Successful removal of tunneled HD catheter. EBL: Minimal No immediate complications.  Ronny Bacon, MD Pager #: [phone]

## 2016-11-03 DIAGNOSIS — Z992 Dependence on renal dialysis: Secondary | ICD-10-CM | POA: Diagnosis not present

## 2016-11-03 DIAGNOSIS — Z23 Encounter for immunization: Secondary | ICD-10-CM | POA: Diagnosis not present

## 2016-11-03 DIAGNOSIS — N186 End stage renal disease: Secondary | ICD-10-CM | POA: Diagnosis not present

## 2016-11-06 DIAGNOSIS — E8770 Fluid overload, unspecified: Secondary | ICD-10-CM | POA: Diagnosis not present

## 2016-11-06 DIAGNOSIS — N186 End stage renal disease: Secondary | ICD-10-CM | POA: Diagnosis not present

## 2016-11-06 DIAGNOSIS — Z992 Dependence on renal dialysis: Secondary | ICD-10-CM | POA: Diagnosis not present

## 2016-11-08 DIAGNOSIS — N186 End stage renal disease: Secondary | ICD-10-CM | POA: Diagnosis not present

## 2016-11-08 DIAGNOSIS — E8770 Fluid overload, unspecified: Secondary | ICD-10-CM | POA: Diagnosis not present

## 2016-11-08 DIAGNOSIS — Z992 Dependence on renal dialysis: Secondary | ICD-10-CM | POA: Diagnosis not present

## 2016-11-08 DIAGNOSIS — E878 Other disorders of electrolyte and fluid balance, not elsewhere classified: Secondary | ICD-10-CM | POA: Diagnosis not present

## 2016-11-10 DIAGNOSIS — E8770 Fluid overload, unspecified: Secondary | ICD-10-CM | POA: Diagnosis not present

## 2016-11-10 DIAGNOSIS — N186 End stage renal disease: Secondary | ICD-10-CM | POA: Diagnosis not present

## 2016-11-10 DIAGNOSIS — Z992 Dependence on renal dialysis: Secondary | ICD-10-CM | POA: Diagnosis not present

## 2016-11-11 DIAGNOSIS — Z992 Dependence on renal dialysis: Secondary | ICD-10-CM | POA: Diagnosis not present

## 2016-11-11 DIAGNOSIS — N186 End stage renal disease: Secondary | ICD-10-CM | POA: Diagnosis not present

## 2016-11-11 DIAGNOSIS — E8770 Fluid overload, unspecified: Secondary | ICD-10-CM | POA: Diagnosis not present

## 2016-11-13 DIAGNOSIS — N186 End stage renal disease: Secondary | ICD-10-CM | POA: Diagnosis not present

## 2016-11-13 DIAGNOSIS — E8770 Fluid overload, unspecified: Secondary | ICD-10-CM | POA: Diagnosis not present

## 2016-11-13 DIAGNOSIS — N2581 Secondary hyperparathyroidism of renal origin: Secondary | ICD-10-CM | POA: Diagnosis not present

## 2016-11-13 DIAGNOSIS — Z992 Dependence on renal dialysis: Secondary | ICD-10-CM | POA: Diagnosis not present

## 2016-11-13 DIAGNOSIS — E878 Other disorders of electrolyte and fluid balance, not elsewhere classified: Secondary | ICD-10-CM | POA: Diagnosis not present

## 2016-11-13 DIAGNOSIS — D631 Anemia in chronic kidney disease: Secondary | ICD-10-CM | POA: Diagnosis not present

## 2016-11-14 ENCOUNTER — Ambulatory Visit (INDEPENDENT_AMBULATORY_CARE_PROVIDER_SITE_OTHER): Payer: BLUE CROSS/BLUE SHIELD | Admitting: Family

## 2016-11-14 ENCOUNTER — Encounter: Payer: Self-pay | Admitting: Family

## 2016-11-14 VITALS — BP 157/56 | HR 47 | Temp 97.0°F | Ht 68.0 in | Wt 138.2 lb

## 2016-11-14 DIAGNOSIS — I358 Other nonrheumatic aortic valve disorders: Secondary | ICD-10-CM

## 2016-11-14 DIAGNOSIS — D638 Anemia in other chronic diseases classified elsewhere: Secondary | ICD-10-CM | POA: Diagnosis not present

## 2016-11-14 DIAGNOSIS — I10 Essential (primary) hypertension: Secondary | ICD-10-CM | POA: Diagnosis not present

## 2016-11-14 DIAGNOSIS — E1169 Type 2 diabetes mellitus with other specified complication: Secondary | ICD-10-CM

## 2016-11-14 DIAGNOSIS — Z992 Dependence on renal dialysis: Secondary | ICD-10-CM | POA: Diagnosis not present

## 2016-11-14 DIAGNOSIS — Z794 Long term (current) use of insulin: Secondary | ICD-10-CM

## 2016-11-14 DIAGNOSIS — I5032 Chronic diastolic (congestive) heart failure: Secondary | ICD-10-CM

## 2016-11-14 DIAGNOSIS — N184 Chronic kidney disease, stage 4 (severe): Secondary | ICD-10-CM | POA: Diagnosis not present

## 2016-11-14 DIAGNOSIS — N186 End stage renal disease: Secondary | ICD-10-CM | POA: Diagnosis not present

## 2016-11-14 DIAGNOSIS — E1165 Type 2 diabetes mellitus with hyperglycemia: Secondary | ICD-10-CM

## 2016-11-14 DIAGNOSIS — E785 Hyperlipidemia, unspecified: Secondary | ICD-10-CM

## 2016-11-14 DIAGNOSIS — Z4822 Encounter for aftercare following kidney transplant: Secondary | ICD-10-CM | POA: Diagnosis not present

## 2016-11-14 DIAGNOSIS — Z1211 Encounter for screening for malignant neoplasm of colon: Secondary | ICD-10-CM

## 2016-11-14 LAB — BAYER DCA HB A1C WAIVED: HB A1C: 8.6 % — AB (ref <7.0)

## 2016-11-14 NOTE — Progress Notes (Signed)
Subjective:    Patient ID: [name], female    DOB: 18-Aug-1955, 61 y.o.   MRN: [id number]  PT presents to the office today establish care. Pt states she has not had a PCP in years and has used the ED as needed. Pt is followed by Cardiologists annually for CHF and aortic valve sclerosis that is stable. Pt states she has appt next week 11/21/16.   Pt currently on dialysis Monday, Wednesday, and Friday and followed by nephrologists there. 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  Diabetes  She presents for her follow-up diabetic visit. She has type 2 diabetes mellitus. Her disease course has been stable. There are no hypoglycemic associated symptoms. Associated symptoms include blurred vision and visual change. Pertinent negatives for diabetes include no foot paresthesias and no foot ulcerations. Symptoms are stable. Diabetic complications include heart disease, nephropathy and peripheral neuropathy. Pertinent negatives for diabetic complications include no CVA. Risk factors for coronary artery disease include diabetes mellitus, family history, sedentary lifestyle and post-menopausal. She is following a generally unhealthy diet. Her breakfast blood glucose range is generally >200 mg/dl. Eye exam is not current.  Anemia  Presents for follow-up visit. Symptoms include bruises/bleeds easily and malaise/fatigue. Past medical history includes heart failure.  Hypertension  This is a chronic problem. The current episode started more than 1 year ago. The problem has been waxing and waning since onset. The problem is uncontrolled. Associated symptoms include blurred vision and malaise/fatigue. Pertinent negatives include no peripheral edema or shortness of breath. Risk factors for coronary artery disease include diabetes mellitus, post-menopausal state and sedentary lifestyle. The current treatment provides mild improvement. Hypertensive end-organ damage includes kidney disease and heart failure.  There is no history of CAD/MI or CVA.  Hyperlipidemia  This is a chronic problem. The current episode started more than 1 year ago. The problem is controlled. Recent lipid tests were reviewed and are normal. Pertinent negatives include no shortness of breath. Current antihyperlipidemic treatment includes statins. The current treatment provides moderate improvement of lipids. Risk factors for coronary artery disease include stress and a sedentary lifestyle.      Review of Systems  Constitutional: Positive for malaise/fatigue.  Eyes: Positive for blurred vision.  Respiratory: Negative for shortness of breath.   Hematological: Bruises/bleeds easily.  All other systems reviewed and are negative.  Breast Cancer-relatedfamily history is not on file.  Social History   Social History  . Marital status: Married    Spouse name: N/A  . Number of children: N/A  . Years of education: N/A   Social History Main Topics  . Smoking status: Never Smoker  . Smokeless tobacco: Never Used  . Alcohol use No  . Drug use: No  . Sexual activity: No   Other Topics Concern  . None   Social History Narrative  . None       Objective:   Physical Exam  Constitutional: She is oriented to person, place, and time. She appears well-developed and well-nourished. No distress.  HENT:  Head: Normocephalic and atraumatic.  Right Ear: External ear normal.  Left Ear: External ear normal.  Nose: Nose normal.  Mouth/Throat: Oropharynx is clear and moist.  Eyes: Pupils are equal, round, and reactive to light.  Neck: Normal range of motion. Neck supple. No thyromegaly present.  Cardiovascular: Normal rate, regular rhythm and intact distal pulses.   Murmur heard. Pulmonary/Chest: Effort normal and breath sounds normal. No respiratory distress. She has no wheezes.  Abdominal: Soft.  Bowel sounds are normal. She exhibits no distension. There is no tenderness.  Musculoskeletal: Normal range of motion. She exhibits  no edema or tenderness.  Neurological: She is alert and oriented to person, place, and time.  Skin: Skin is warm and dry. There is pallor.  Psychiatric: She has a normal mood and affect. Her behavior is normal. Judgment and thought content normal.  Vitals reviewed.     BP (!) 157/56   Pulse (!) 47   Temp (!) 97 F (36.1 C) (Oral)   Ht '5\' 8"'$  (1.727 m)   Wt 138 lb 3.2 oz (62.7 kg)   BMI 21.01 kg/m      Assessment & Plan:  1. Benign essential hypertension - CMP14+EGFR  2. Aortic valve sclerosis - CMP14+EGFR  3. Chronic diastolic heart failure (HCC) - CMP14+EGFR  4. Type 2 diabetes mellitus with hyperglycemia, with long-term current use of insulin (HCC) - Ambulatory referral to Ophthalmology - Bayer DCA Hb A1c Waived - CMP14+EGFR - Microalbumin / creatinine urine ratio  5. 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 CKD (chronic kidney disease) stage 4, GFR 15-29 ml/min (HCC) - CMP14+EGFR  7. Anemia of chronic disease - CMP14+EGFR - Anemia Profile B  8. Encounter for aftercare following kidney transplant - CMP14+EGFR  9. Colon cancer screening - CMP14+EGFR - Fecal occult blood, imunochemical; Future  10. Hyperlipidemia associated with type 2 diabetes mellitus ([place]) - CMP14+EGFR - Lipid panel   Continue all meds Labs pending Health Maintenance reviewed Diet and exercise encouraged RTO 3 months   Evelina Dun, FNP

## 2016-11-14 NOTE — Patient Instructions (Signed)
Diabetes Mellitus and Food It is important for you to manage your blood sugar (glucose) level. Your blood glucose level can be greatly affected by what you eat. Eating healthier foods in the appropriate amounts throughout the day at about the same time each day will help you control your blood glucose level. It can also help slow or prevent worsening of your diabetes mellitus. Healthy eating may even help you improve the level of your blood pressure and reach or maintain a healthy weight. General recommendations for healthful eating and cooking habits include:  Eating meals and snacks regularly. Avoid going long periods of time without eating to lose weight.  Eating a diet that consists mainly of plant-based foods, such as fruits, vegetables, nuts, legumes, and whole grains.  Using low-heat cooking methods, such as baking, instead of high-heat cooking methods, such as deep frying.  Work with your dietitian to make sure you understand how to use the Nutrition Facts information on food labels. How can food affect me? Carbohydrates Carbohydrates affect your blood glucose level more than any other type of food. Your dietitian will help you determine how many carbohydrates to eat at each meal and teach you how to count carbohydrates. Counting carbohydrates is important to keep your blood glucose at a healthy level, especially if you are using insulin or taking certain medicines for diabetes mellitus. Alcohol Alcohol can cause sudden decreases in blood glucose (hypoglycemia), especially if you use insulin or take certain medicines for diabetes mellitus. Hypoglycemia can be a life-threatening condition. Symptoms of hypoglycemia (sleepiness, dizziness, and disorientation) are similar to symptoms of having too much alcohol. If your health care provider has given you approval to drink alcohol, do so in moderation and use the following guidelines:  Women should not have more than one drink per day, and men  should not have more than two drinks per day. One drink is equal to: ? 12 oz of beer. ? 5 oz of wine. ? 1 oz of hard liquor.  Do not drink on an empty stomach.  Keep yourself hydrated. Have water, diet soda, or unsweetened iced tea.  Regular soda, juice, and other mixers might contain a lot of carbohydrates and should be counted.  What foods are not recommended? As you make food choices, it is important to remember that all foods are not the same. Some foods have fewer nutrients per serving than other foods, even though they might have the same number of calories or carbohydrates. It is difficult to get your body what it needs when you eat foods with fewer nutrients. Examples of foods that you should avoid that are high in calories and carbohydrates but low in nutrients include:  Trans fats (most processed foods list trans fats on the Nutrition Facts label).  Regular soda.  Juice.  Candy.  Sweets, such as cake, pie, doughnuts, and cookies.  Fried foods.  What foods can I eat? Eat nutrient-rich foods, which will nourish your body and keep you healthy. The food you should eat also will depend on several factors, including:  The calories you need.  The medicines you take.  Your weight.  Your blood glucose level.  Your blood pressure level.  Your cholesterol level.  You should eat a variety of foods, including:  Protein. ? Lean cuts of meat. ? Proteins low in saturated fats, such as fish, egg whites, and beans. Avoid processed meats.  Fruits and vegetables. ? Fruits and vegetables that may help control blood glucose levels, such as apples,   mangoes, and yams.  Dairy products. ? Choose fat-free or low-fat dairy products, such as milk, yogurt, and cheese.  Grains, bread, pasta, and rice. ? Choose whole grain products, such as multigrain bread, whole oats, and brown rice. These foods may help control blood pressure.  Fats. ? Foods containing healthful fats, such as  nuts, avocado, olive oil, canola oil, and fish.  Does everyone with diabetes mellitus have the same meal plan? Because every person with diabetes mellitus is different, there is not one meal plan that works for everyone. It is very important that you meet with a dietitian who will help you create a meal plan that is just right for you. This information is not intended to replace advice given to you by your health care provider. Make sure you discuss any questions you have with your health care provider. Document Released: 11/17/2004 Document Revised: 07/29/2015 Document Reviewed: 01/17/2013 Elsevier Interactive Patient Education  2017 [employer] Inc.  

## 2016-11-15 DIAGNOSIS — Z992 Dependence on renal dialysis: Secondary | ICD-10-CM | POA: Diagnosis not present

## 2016-11-15 DIAGNOSIS — E8770 Fluid overload, unspecified: Secondary | ICD-10-CM | POA: Diagnosis not present

## 2016-11-15 DIAGNOSIS — N186 End stage renal disease: Secondary | ICD-10-CM | POA: Diagnosis not present

## 2016-11-15 LAB — ANEMIA PROFILE B
BASOS: 0 %
Basophils Absolute: 0 10*3/uL (ref 0.0–0.2)
EOS (ABSOLUTE): 0.8 10*3/uL — ABNORMAL HIGH (ref 0.0–0.4)
EOS: 9 %
FERRITIN: 971 ng/mL — AB (ref 15–150)
FOLATE: 5.6 ng/mL (ref >3.0)
HEMATOCRIT: 31.6 % — AB (ref 34.0–46.6)
Hemoglobin: 9.8 g/dL — ABNORMAL LOW (ref 11.1–15.9)
Immature Grans (Abs): 0 10*3/uL (ref 0.0–0.1)
Immature Granulocytes: 0 %
Iron Saturation: 17 % (ref 15–55)
Iron: 41 ug/dL (ref 27–139)
LYMPHS ABS: 0.9 10*3/uL (ref 0.7–3.1)
Lymphs: 10 %
MCH: 28.8 pg (ref 26.6–33.0)
MCHC: 31 g/dL — ABNORMAL LOW (ref 31.5–35.7)
MCV: 93 fL (ref 79–97)
MONOS ABS: 0.8 10*3/uL (ref 0.1–0.9)
Monocytes: 9 %
Neutrophils Absolute: 6.2 10*3/uL (ref 1.4–7.0)
Neutrophils: 72 %
PLATELETS: 193 10*3/uL (ref 150–379)
RBC: 3.4 x10E6/uL — ABNORMAL LOW (ref 3.77–5.28)
RDW: 15.5 % — ABNORMAL HIGH (ref 12.3–15.4)
Retic Ct Pct: 3.4 % — ABNORMAL HIGH (ref 0.6–2.6)
TIBC: 245 ug/dL — AB (ref 250–450)
UIBC: 204 ug/dL (ref 118–369)
Vitamin B-12: 569 pg/mL (ref 232–1245)
WBC: 8.8 10*3/uL (ref 3.4–10.8)

## 2016-11-15 LAB — CMP14+EGFR
ALK PHOS: 135 IU/L — AB (ref 39–117)
ALT: 25 IU/L (ref 0–32)
AST: 32 IU/L (ref 0–40)
Albumin/Globulin Ratio: 1.4 (ref 1.2–2.2)
Albumin: 4.2 g/dL (ref 3.6–4.8)
BUN/Creatinine Ratio: 10 — ABNORMAL LOW (ref 12–28)
BUN: 41 mg/dL — AB (ref 8–27)
Bilirubin Total: 0.5 mg/dL (ref 0.0–1.2)
CHLORIDE: 92 mmol/L — AB (ref 96–106)
CO2: 25 mmol/L (ref 20–29)
Calcium: 9.4 mg/dL (ref 8.7–10.3)
Creatinine, Ser: 4.22 mg/dL (ref 0.57–1.00)
GFR calc Af Amer: 12 mL/min/{1.73_m2} — ABNORMAL LOW (ref >59)
GFR calc non Af Amer: 11 mL/min/{1.73_m2} — ABNORMAL LOW (ref >59)
GLUCOSE: 363 mg/dL — AB (ref 65–99)
Globulin, Total: 2.9 g/dL (ref 1.5–4.5)
POTASSIUM: 4.6 mmol/L (ref 3.5–5.2)
Sodium: 135 mmol/L (ref 134–144)
Total Protein: 7.1 g/dL (ref 6.0–8.5)

## 2016-11-15 LAB — LIPID PANEL
CHOLESTEROL TOTAL: 99 mg/dL — AB (ref 100–199)
Chol/HDL Ratio: 1.9 ratio (ref 0.0–4.4)
HDL: 53 mg/dL (ref >39)
LDL Calculated: 24 mg/dL (ref 0–99)
Triglycerides: 108 mg/dL (ref 0–149)
VLDL CHOLESTEROL CAL: 22 mg/dL (ref 5–40)

## 2016-11-15 LAB — MICROALBUMIN / CREATININE URINE RATIO
Creatinine, Urine: 200.4 mg/dL
MICROALB/CREAT RATIO: 674.5 mg/g{creat} — AB (ref 0.0–30.0)
Microalbumin, Urine: 1351.6 ug/mL

## 2016-11-16 ENCOUNTER — Other Ambulatory Visit: Payer: Self-pay | Admitting: Family

## 2016-11-16 ENCOUNTER — Telehealth: Payer: Self-pay | Admitting: Family

## 2016-11-16 MED ORDER — DULAGLUTIDE 0.75 MG/0.5ML ~~LOC~~ SOAJ: 0.7500 mg | SUBCUTANEOUS | 3 refills | Status: DC

## 2016-11-16 NOTE — Telephone Encounter (Signed)
Pt aware of labs  

## 2016-11-17 DIAGNOSIS — N186 End stage renal disease: Secondary | ICD-10-CM | POA: Diagnosis not present

## 2016-11-17 DIAGNOSIS — Z992 Dependence on renal dialysis: Secondary | ICD-10-CM | POA: Diagnosis not present

## 2016-11-17 DIAGNOSIS — E8770 Fluid overload, unspecified: Secondary | ICD-10-CM | POA: Diagnosis not present

## 2016-11-20 DIAGNOSIS — N186 End stage renal disease: Secondary | ICD-10-CM | POA: Diagnosis not present

## 2016-11-20 DIAGNOSIS — E878 Other disorders of electrolyte and fluid balance, not elsewhere classified: Secondary | ICD-10-CM | POA: Diagnosis not present

## 2016-11-20 DIAGNOSIS — Z992 Dependence on renal dialysis: Secondary | ICD-10-CM | POA: Diagnosis not present

## 2016-11-20 DIAGNOSIS — E8770 Fluid overload, unspecified: Secondary | ICD-10-CM | POA: Diagnosis not present

## 2016-11-21 ENCOUNTER — Encounter: Payer: Self-pay | Admitting: Family

## 2016-11-21 ENCOUNTER — Ambulatory Visit (INDEPENDENT_AMBULATORY_CARE_PROVIDER_SITE_OTHER): Payer: BLUE CROSS/BLUE SHIELD | Admitting: Family

## 2016-11-21 VITALS — BP 135/57 | HR 70 | Temp 101.5°F | Ht 68.0 in | Wt 136.0 lb

## 2016-11-21 DIAGNOSIS — A084 Viral intestinal infection, unspecified: Secondary | ICD-10-CM

## 2016-11-21 MED ORDER — ONDANSETRON 4 MG PO TBDP
4.0000 mg | ORAL_TABLET | Freq: Three times a day (TID) | ORAL | 2 refills | Status: DC | PRN
Start: 2016-11-21 — End: 2017-04-03

## 2016-11-21 NOTE — Progress Notes (Signed)
   Subjective:    Patient ID: [name], female    DOB: 1955-11-05, 61 y.o.   MRN: [id number]  Emesis   This is a new problem. The current episode started today. The problem occurs less than 2 times per day. The problem has been unchanged. The emesis has an appearance of stomach contents. The maximum temperature recorded prior to her arrival was 101 - 101.9 F. Associated symptoms include arthralgias, chills, coughing, dizziness, a fever and myalgias. She has tried sleep and bed rest for the symptoms. The treatment provided mild relief.  Cough  Associated symptoms include chills, a fever and myalgias.      Review of Systems  Constitutional: Positive for chills and fever.  Respiratory: Positive for cough.   Gastrointestinal: Positive for vomiting.  Musculoskeletal: Positive for arthralgias and myalgias.  Neurological: Positive for dizziness.  All other systems reviewed and are negative.      Objective:   Physical Exam  Constitutional: She is oriented to person, place, and time. She appears well-developed and well-nourished. No distress.  HENT:  Head: Normocephalic and atraumatic.  Right Ear: External ear normal.  Left Ear: External ear normal.  Nose: Nose normal.  Mouth/Throat: Oropharynx is clear and moist.  Eyes: Pupils are equal, round, and reactive to light.  Neck: Normal range of motion. Neck supple. No thyromegaly present.  Cardiovascular: Normal rate, regular rhythm, normal heart sounds and intact distal pulses.   No murmur heard. Pulmonary/Chest: Effort normal. No respiratory distress. She has decreased breath sounds. She has no wheezes.  Abdominal: Soft. She exhibits no distension. There is no tenderness.  Hyperactive   Musculoskeletal: Normal range of motion. She exhibits no edema or tenderness.  Neurological: She is alert and oriented to person, place, and time.  Skin: Skin is warm and dry.  Psychiatric: She has a normal mood and affect. Her behavior is normal.  Judgment and thought content normal.  Vitals reviewed.     BP (!) 135/57 (BP Location: Right Arm, Patient Position: Sitting, Cuff Size: Normal)   Pulse 70   Temp (!) 101.5 F (38.6 C) (Oral)   Ht 5\' 8"  (1.727 m)   Wt 136 lb (61.7 kg)   SpO2 96%   BMI 20.68 kg/m      Assessment & Plan:  1. Viral gastroenteritis Force fluids Tylenol as needed Rest Good hand hygiene - ondansetron (ZOFRAN-ODT) 4 MG disintegrating tablet; Take 1 tablet (4 mg total) by mouth every 8 (eight) hours as needed for nausea or vomiting.  Dispense: 20 tablet; Refill: 2   I believe this is viral and not pneumonia at this time. Daughter worried about pneumonia. Pt told to call office if patient has any cough, SOB, or chest tightness.  Evelina Dun, FNP

## 2016-11-21 NOTE — Patient Instructions (Signed)

## 2016-11-22 DIAGNOSIS — E8770 Fluid overload, unspecified: Secondary | ICD-10-CM | POA: Diagnosis not present

## 2016-11-22 DIAGNOSIS — N186 End stage renal disease: Secondary | ICD-10-CM | POA: Diagnosis not present

## 2016-11-22 DIAGNOSIS — Z992 Dependence on renal dialysis: Secondary | ICD-10-CM | POA: Diagnosis not present

## 2016-11-24 DIAGNOSIS — N184 Chronic kidney disease, stage 4 (severe): Secondary | ICD-10-CM | POA: Diagnosis not present

## 2016-11-24 DIAGNOSIS — Z992 Dependence on renal dialysis: Secondary | ICD-10-CM | POA: Diagnosis not present

## 2016-11-24 DIAGNOSIS — I770 Arteriovenous fistula, acquired: Secondary | ICD-10-CM | POA: Diagnosis not present

## 2016-11-24 DIAGNOSIS — N186 End stage renal disease: Secondary | ICD-10-CM | POA: Diagnosis not present

## 2016-11-25 DIAGNOSIS — N186 End stage renal disease: Secondary | ICD-10-CM | POA: Diagnosis not present

## 2016-11-25 DIAGNOSIS — E8770 Fluid overload, unspecified: Secondary | ICD-10-CM | POA: Diagnosis not present

## 2016-11-25 DIAGNOSIS — Z992 Dependence on renal dialysis: Secondary | ICD-10-CM | POA: Diagnosis not present

## 2016-11-27 DIAGNOSIS — E11649 Type 2 diabetes mellitus with hypoglycemia without coma: Secondary | ICD-10-CM | POA: Diagnosis not present

## 2016-11-27 DIAGNOSIS — Z992 Dependence on renal dialysis: Secondary | ICD-10-CM | POA: Diagnosis not present

## 2016-11-27 DIAGNOSIS — Z794 Long term (current) use of insulin: Secondary | ICD-10-CM | POA: Diagnosis not present

## 2016-11-27 DIAGNOSIS — E8770 Fluid overload, unspecified: Secondary | ICD-10-CM | POA: Diagnosis not present

## 2016-11-27 DIAGNOSIS — E119 Type 2 diabetes mellitus without complications: Secondary | ICD-10-CM | POA: Diagnosis not present

## 2016-11-27 DIAGNOSIS — I259 Chronic ischemic heart disease, unspecified: Secondary | ICD-10-CM | POA: Diagnosis not present

## 2016-11-27 DIAGNOSIS — D509 Iron deficiency anemia, unspecified: Secondary | ICD-10-CM | POA: Diagnosis not present

## 2016-11-27 DIAGNOSIS — E162 Hypoglycemia, unspecified: Secondary | ICD-10-CM | POA: Diagnosis not present

## 2016-11-27 DIAGNOSIS — N186 End stage renal disease: Secondary | ICD-10-CM | POA: Diagnosis not present

## 2016-11-29 DIAGNOSIS — N186 End stage renal disease: Secondary | ICD-10-CM | POA: Diagnosis not present

## 2016-11-29 DIAGNOSIS — E8770 Fluid overload, unspecified: Secondary | ICD-10-CM | POA: Diagnosis not present

## 2016-11-29 DIAGNOSIS — Z992 Dependence on renal dialysis: Secondary | ICD-10-CM | POA: Diagnosis not present

## 2016-12-01 DIAGNOSIS — N186 End stage renal disease: Secondary | ICD-10-CM | POA: Diagnosis not present

## 2016-12-01 DIAGNOSIS — N2581 Secondary hyperparathyroidism of renal origin: Secondary | ICD-10-CM | POA: Diagnosis not present

## 2016-12-01 DIAGNOSIS — E8770 Fluid overload, unspecified: Secondary | ICD-10-CM | POA: Diagnosis not present

## 2016-12-01 DIAGNOSIS — Z992 Dependence on renal dialysis: Secondary | ICD-10-CM | POA: Diagnosis not present

## 2016-12-03 DIAGNOSIS — N186 End stage renal disease: Secondary | ICD-10-CM | POA: Diagnosis not present

## 2016-12-03 DIAGNOSIS — Z992 Dependence on renal dialysis: Secondary | ICD-10-CM | POA: Diagnosis not present

## 2016-12-04 DIAGNOSIS — Z992 Dependence on renal dialysis: Secondary | ICD-10-CM | POA: Diagnosis not present

## 2016-12-04 DIAGNOSIS — N186 End stage renal disease: Secondary | ICD-10-CM | POA: Diagnosis not present

## 2016-12-04 DIAGNOSIS — Z23 Encounter for immunization: Secondary | ICD-10-CM | POA: Diagnosis not present

## 2016-12-04 DIAGNOSIS — E878 Other disorders of electrolyte and fluid balance, not elsewhere classified: Secondary | ICD-10-CM | POA: Diagnosis not present

## 2016-12-06 DIAGNOSIS — N186 End stage renal disease: Secondary | ICD-10-CM | POA: Diagnosis not present

## 2016-12-06 DIAGNOSIS — Z992 Dependence on renal dialysis: Secondary | ICD-10-CM | POA: Diagnosis not present

## 2016-12-06 DIAGNOSIS — Z23 Encounter for immunization: Secondary | ICD-10-CM | POA: Diagnosis not present

## 2016-12-08 DIAGNOSIS — Z992 Dependence on renal dialysis: Secondary | ICD-10-CM | POA: Diagnosis not present

## 2016-12-08 DIAGNOSIS — N186 End stage renal disease: Secondary | ICD-10-CM | POA: Diagnosis not present

## 2016-12-08 DIAGNOSIS — Z23 Encounter for immunization: Secondary | ICD-10-CM | POA: Diagnosis not present

## 2016-12-11 DIAGNOSIS — N2581 Secondary hyperparathyroidism of renal origin: Secondary | ICD-10-CM | POA: Diagnosis not present

## 2016-12-11 DIAGNOSIS — Z23 Encounter for immunization: Secondary | ICD-10-CM | POA: Diagnosis not present

## 2016-12-11 DIAGNOSIS — E878 Other disorders of electrolyte and fluid balance, not elsewhere classified: Secondary | ICD-10-CM | POA: Diagnosis not present

## 2016-12-11 DIAGNOSIS — N186 End stage renal disease: Secondary | ICD-10-CM | POA: Diagnosis not present

## 2016-12-11 DIAGNOSIS — Z992 Dependence on renal dialysis: Secondary | ICD-10-CM | POA: Diagnosis not present

## 2016-12-11 DIAGNOSIS — D631 Anemia in chronic kidney disease: Secondary | ICD-10-CM | POA: Diagnosis not present

## 2016-12-13 DIAGNOSIS — Z23 Encounter for immunization: Secondary | ICD-10-CM | POA: Diagnosis not present

## 2016-12-13 DIAGNOSIS — N186 End stage renal disease: Secondary | ICD-10-CM | POA: Diagnosis not present

## 2016-12-13 DIAGNOSIS — Z992 Dependence on renal dialysis: Secondary | ICD-10-CM | POA: Diagnosis not present

## 2016-12-14 DIAGNOSIS — E113393 Type 2 diabetes mellitus with moderate nonproliferative diabetic retinopathy without macular edema, bilateral: Secondary | ICD-10-CM | POA: Diagnosis not present

## 2016-12-14 DIAGNOSIS — H25813 Combined forms of age-related cataract, bilateral: Secondary | ICD-10-CM | POA: Diagnosis not present

## 2016-12-14 DIAGNOSIS — Z794 Long term (current) use of insulin: Secondary | ICD-10-CM | POA: Diagnosis not present

## 2016-12-14 LAB — HM DIABETES EYE EXAM

## 2016-12-15 DIAGNOSIS — Z992 Dependence on renal dialysis: Secondary | ICD-10-CM | POA: Diagnosis not present

## 2016-12-15 DIAGNOSIS — N186 End stage renal disease: Secondary | ICD-10-CM | POA: Diagnosis not present

## 2016-12-15 DIAGNOSIS — Z23 Encounter for immunization: Secondary | ICD-10-CM | POA: Diagnosis not present

## 2016-12-18 DIAGNOSIS — Z992 Dependence on renal dialysis: Secondary | ICD-10-CM | POA: Diagnosis not present

## 2016-12-18 DIAGNOSIS — Z23 Encounter for immunization: Secondary | ICD-10-CM | POA: Diagnosis not present

## 2016-12-18 DIAGNOSIS — E878 Other disorders of electrolyte and fluid balance, not elsewhere classified: Secondary | ICD-10-CM | POA: Diagnosis not present

## 2016-12-18 DIAGNOSIS — D631 Anemia in chronic kidney disease: Secondary | ICD-10-CM | POA: Diagnosis not present

## 2016-12-18 DIAGNOSIS — N186 End stage renal disease: Secondary | ICD-10-CM | POA: Diagnosis not present

## 2016-12-20 DIAGNOSIS — N186 End stage renal disease: Secondary | ICD-10-CM | POA: Diagnosis not present

## 2016-12-20 DIAGNOSIS — Z23 Encounter for immunization: Secondary | ICD-10-CM | POA: Diagnosis not present

## 2016-12-20 DIAGNOSIS — Z992 Dependence on renal dialysis: Secondary | ICD-10-CM | POA: Diagnosis not present

## 2016-12-22 DIAGNOSIS — Z992 Dependence on renal dialysis: Secondary | ICD-10-CM | POA: Diagnosis not present

## 2016-12-22 DIAGNOSIS — Z23 Encounter for immunization: Secondary | ICD-10-CM | POA: Diagnosis not present

## 2016-12-22 DIAGNOSIS — N186 End stage renal disease: Secondary | ICD-10-CM | POA: Diagnosis not present

## 2016-12-25 DIAGNOSIS — Z992 Dependence on renal dialysis: Secondary | ICD-10-CM | POA: Diagnosis not present

## 2016-12-25 DIAGNOSIS — N186 End stage renal disease: Secondary | ICD-10-CM | POA: Diagnosis not present

## 2016-12-25 DIAGNOSIS — Z23 Encounter for immunization: Secondary | ICD-10-CM | POA: Diagnosis not present

## 2016-12-27 DIAGNOSIS — Z992 Dependence on renal dialysis: Secondary | ICD-10-CM | POA: Diagnosis not present

## 2016-12-27 DIAGNOSIS — N186 End stage renal disease: Secondary | ICD-10-CM | POA: Diagnosis not present

## 2016-12-27 DIAGNOSIS — Z23 Encounter for immunization: Secondary | ICD-10-CM | POA: Diagnosis not present

## 2016-12-29 DIAGNOSIS — Z992 Dependence on renal dialysis: Secondary | ICD-10-CM | POA: Diagnosis not present

## 2016-12-29 DIAGNOSIS — N186 End stage renal disease: Secondary | ICD-10-CM | POA: Diagnosis not present

## 2016-12-29 DIAGNOSIS — Z23 Encounter for immunization: Secondary | ICD-10-CM | POA: Diagnosis not present

## 2017-01-01 DIAGNOSIS — N186 End stage renal disease: Secondary | ICD-10-CM | POA: Diagnosis not present

## 2017-01-01 DIAGNOSIS — Z992 Dependence on renal dialysis: Secondary | ICD-10-CM | POA: Diagnosis not present

## 2017-01-01 DIAGNOSIS — Z23 Encounter for immunization: Secondary | ICD-10-CM | POA: Diagnosis not present

## 2017-01-02 DIAGNOSIS — Z01818 Encounter for other preprocedural examination: Secondary | ICD-10-CM | POA: Diagnosis not present

## 2017-01-02 DIAGNOSIS — N186 End stage renal disease: Secondary | ICD-10-CM | POA: Diagnosis not present

## 2017-01-02 DIAGNOSIS — Z992 Dependence on renal dialysis: Secondary | ICD-10-CM | POA: Diagnosis not present

## 2017-01-03 DIAGNOSIS — Z23 Encounter for immunization: Secondary | ICD-10-CM | POA: Diagnosis not present

## 2017-01-03 DIAGNOSIS — N186 End stage renal disease: Secondary | ICD-10-CM | POA: Diagnosis not present

## 2017-01-03 DIAGNOSIS — Z992 Dependence on renal dialysis: Secondary | ICD-10-CM | POA: Diagnosis not present

## 2017-01-05 DIAGNOSIS — Z992 Dependence on renal dialysis: Secondary | ICD-10-CM | POA: Diagnosis not present

## 2017-01-05 DIAGNOSIS — N186 End stage renal disease: Secondary | ICD-10-CM | POA: Diagnosis not present

## 2017-01-08 DIAGNOSIS — Z992 Dependence on renal dialysis: Secondary | ICD-10-CM | POA: Diagnosis not present

## 2017-01-08 DIAGNOSIS — N186 End stage renal disease: Secondary | ICD-10-CM | POA: Diagnosis not present

## 2017-01-10 DIAGNOSIS — Z992 Dependence on renal dialysis: Secondary | ICD-10-CM | POA: Diagnosis not present

## 2017-01-10 DIAGNOSIS — N186 End stage renal disease: Secondary | ICD-10-CM | POA: Diagnosis not present

## 2017-01-12 DIAGNOSIS — N186 End stage renal disease: Secondary | ICD-10-CM | POA: Diagnosis not present

## 2017-01-12 DIAGNOSIS — Z992 Dependence on renal dialysis: Secondary | ICD-10-CM | POA: Diagnosis not present

## 2017-01-15 DIAGNOSIS — N186 End stage renal disease: Secondary | ICD-10-CM | POA: Diagnosis not present

## 2017-01-15 DIAGNOSIS — Z992 Dependence on renal dialysis: Secondary | ICD-10-CM | POA: Diagnosis not present

## 2017-01-17 DIAGNOSIS — Z992 Dependence on renal dialysis: Secondary | ICD-10-CM | POA: Diagnosis not present

## 2017-01-17 DIAGNOSIS — N186 End stage renal disease: Secondary | ICD-10-CM | POA: Diagnosis not present

## 2017-01-19 DIAGNOSIS — Z992 Dependence on renal dialysis: Secondary | ICD-10-CM | POA: Diagnosis not present

## 2017-01-19 DIAGNOSIS — N186 End stage renal disease: Secondary | ICD-10-CM | POA: Diagnosis not present

## 2017-01-22 DIAGNOSIS — N186 End stage renal disease: Secondary | ICD-10-CM | POA: Diagnosis not present

## 2017-01-22 DIAGNOSIS — Z992 Dependence on renal dialysis: Secondary | ICD-10-CM | POA: Diagnosis not present

## 2017-01-24 DIAGNOSIS — N186 End stage renal disease: Secondary | ICD-10-CM | POA: Diagnosis not present

## 2017-01-24 DIAGNOSIS — Z992 Dependence on renal dialysis: Secondary | ICD-10-CM | POA: Diagnosis not present

## 2017-01-26 DIAGNOSIS — Z992 Dependence on renal dialysis: Secondary | ICD-10-CM | POA: Diagnosis not present

## 2017-01-26 DIAGNOSIS — N186 End stage renal disease: Secondary | ICD-10-CM | POA: Diagnosis not present

## 2017-01-29 DIAGNOSIS — N186 End stage renal disease: Secondary | ICD-10-CM | POA: Diagnosis not present

## 2017-01-29 DIAGNOSIS — Z992 Dependence on renal dialysis: Secondary | ICD-10-CM | POA: Diagnosis not present

## 2017-01-30 DIAGNOSIS — I10 Essential (primary) hypertension: Secondary | ICD-10-CM | POA: Diagnosis not present

## 2017-01-31 DIAGNOSIS — N186 End stage renal disease: Secondary | ICD-10-CM | POA: Diagnosis not present

## 2017-01-31 DIAGNOSIS — Z992 Dependence on renal dialysis: Secondary | ICD-10-CM | POA: Diagnosis not present

## 2017-02-02 DIAGNOSIS — N186 End stage renal disease: Secondary | ICD-10-CM | POA: Diagnosis not present

## 2017-02-02 DIAGNOSIS — Z992 Dependence on renal dialysis: Secondary | ICD-10-CM | POA: Diagnosis not present

## 2017-02-05 DIAGNOSIS — N186 End stage renal disease: Secondary | ICD-10-CM | POA: Diagnosis not present

## 2017-02-05 DIAGNOSIS — Z992 Dependence on renal dialysis: Secondary | ICD-10-CM | POA: Diagnosis not present

## 2017-02-07 DIAGNOSIS — Z992 Dependence on renal dialysis: Secondary | ICD-10-CM | POA: Diagnosis not present

## 2017-02-07 DIAGNOSIS — N186 End stage renal disease: Secondary | ICD-10-CM | POA: Diagnosis not present

## 2017-02-09 DIAGNOSIS — N186 End stage renal disease: Secondary | ICD-10-CM | POA: Diagnosis not present

## 2017-02-09 DIAGNOSIS — Z992 Dependence on renal dialysis: Secondary | ICD-10-CM | POA: Diagnosis not present

## 2017-02-12 DIAGNOSIS — Z992 Dependence on renal dialysis: Secondary | ICD-10-CM | POA: Diagnosis not present

## 2017-02-12 DIAGNOSIS — N186 End stage renal disease: Secondary | ICD-10-CM | POA: Diagnosis not present

## 2017-02-13 ENCOUNTER — Ambulatory Visit: Payer: BLUE CROSS/BLUE SHIELD | Admitting: Family

## 2017-02-14 DIAGNOSIS — N186 End stage renal disease: Secondary | ICD-10-CM | POA: Diagnosis not present

## 2017-02-14 DIAGNOSIS — Z992 Dependence on renal dialysis: Secondary | ICD-10-CM | POA: Diagnosis not present

## 2017-02-16 DIAGNOSIS — N186 End stage renal disease: Secondary | ICD-10-CM | POA: Diagnosis not present

## 2017-02-16 DIAGNOSIS — Z992 Dependence on renal dialysis: Secondary | ICD-10-CM | POA: Diagnosis not present

## 2017-02-20 ENCOUNTER — Ambulatory Visit: Payer: BLUE CROSS/BLUE SHIELD | Admitting: Family

## 2017-02-20 DIAGNOSIS — Z992 Dependence on renal dialysis: Secondary | ICD-10-CM | POA: Diagnosis not present

## 2017-02-20 DIAGNOSIS — N186 End stage renal disease: Secondary | ICD-10-CM | POA: Diagnosis not present

## 2017-02-21 DIAGNOSIS — N186 End stage renal disease: Secondary | ICD-10-CM | POA: Diagnosis not present

## 2017-02-21 DIAGNOSIS — Z992 Dependence on renal dialysis: Secondary | ICD-10-CM | POA: Diagnosis not present

## 2017-02-22 ENCOUNTER — Ambulatory Visit: Payer: BLUE CROSS/BLUE SHIELD | Admitting: Family

## 2017-02-22 ENCOUNTER — Encounter: Payer: Self-pay | Admitting: Family

## 2017-02-22 VITALS — BP 128/67 | HR 59 | Temp 97.4°F | Ht 68.0 in | Wt 136.0 lb

## 2017-02-22 DIAGNOSIS — Z794 Long term (current) use of insulin: Secondary | ICD-10-CM | POA: Diagnosis not present

## 2017-02-22 DIAGNOSIS — I152 Hypertension secondary to endocrine disorders: Secondary | ICD-10-CM

## 2017-02-22 DIAGNOSIS — E785 Hyperlipidemia, unspecified: Secondary | ICD-10-CM

## 2017-02-22 DIAGNOSIS — E1169 Type 2 diabetes mellitus with other specified complication: Secondary | ICD-10-CM

## 2017-02-22 DIAGNOSIS — I10 Essential (primary) hypertension: Secondary | ICD-10-CM

## 2017-02-22 DIAGNOSIS — E1165 Type 2 diabetes mellitus with hyperglycemia: Secondary | ICD-10-CM | POA: Diagnosis not present

## 2017-02-22 DIAGNOSIS — N186 End stage renal disease: Secondary | ICD-10-CM

## 2017-02-22 DIAGNOSIS — I358 Other nonrheumatic aortic valve disorders: Secondary | ICD-10-CM

## 2017-02-22 DIAGNOSIS — E1159 Type 2 diabetes mellitus with other circulatory complications: Secondary | ICD-10-CM

## 2017-02-22 DIAGNOSIS — Z992 Dependence on renal dialysis: Secondary | ICD-10-CM | POA: Diagnosis not present

## 2017-02-22 DIAGNOSIS — I5032 Chronic diastolic (congestive) heart failure: Secondary | ICD-10-CM | POA: Diagnosis not present

## 2017-02-22 LAB — BAYER DCA HB A1C WAIVED: HB A1C: 8.7 % — AB (ref <7.0)

## 2017-02-22 NOTE — Patient Instructions (Signed)
Diabetes Mellitus and Nutrition When you have diabetes (diabetes mellitus), it is very important to have healthy eating habits because your blood sugar (glucose) levels are greatly affected by what you eat and drink. Eating healthy foods in the appropriate amounts, at about the same times every day, can help you:  Control your blood glucose.  Lower your risk of heart disease.  Improve your blood pressure.  Reach or maintain a healthy weight.  Every person with diabetes is different, and each person has different needs for a meal plan. Your health care provider may recommend that you work with a diet and nutrition specialist (dietitian) to make a meal plan that is best for you. Your meal plan may vary depending on factors such as:  The calories you need.  The medicines you take.  Your weight.  Your blood glucose, blood pressure, and cholesterol levels.  Your activity level.  Other health conditions you have, such as heart or kidney disease.  How do carbohydrates affect me? Carbohydrates affect your blood glucose level more than any other type of food. Eating carbohydrates naturally increases the amount of glucose in your blood. Carbohydrate counting is a method for keeping track of how many carbohydrates you eat. Counting carbohydrates is important to keep your blood glucose at a healthy level, especially if you use insulin or take certain oral diabetes medicines. It is important to know how many carbohydrates you can safely have in each meal. This is different for every person. Your dietitian can help you calculate how many carbohydrates you should have at each meal and for snack. Foods that contain carbohydrates include:  Bread, cereal, rice, pasta, and crackers.  Potatoes and corn.  Peas, beans, and lentils.  Milk and yogurt.  Fruit and juice.  Desserts, such as cakes, cookies, ice cream, and candy.  How does alcohol affect me? Alcohol can cause a sudden decrease in blood  glucose (hypoglycemia), especially if you use insulin or take certain oral diabetes medicines. Hypoglycemia can be a life-threatening condition. Symptoms of hypoglycemia (sleepiness, dizziness, and confusion) are similar to symptoms of having too much alcohol. If your health care provider says that alcohol is safe for you, follow these guidelines:  Limit alcohol intake to no more than 1 drink per day for nonpregnant women and 2 drinks per day for men. One drink equals 12 oz of beer, 5 oz of wine, or 1 oz of hard liquor.  Do not drink on an empty stomach.  Keep yourself hydrated with water, diet soda, or unsweetened iced tea.  Keep in mind that regular soda, juice, and other mixers may contain a lot of sugar and must be counted as carbohydrates.  What are tips for following this plan? Reading food labels  Start by checking the serving size on the label. The amount of calories, carbohydrates, fats, and other nutrients listed on the label are based on one serving of the food. Many foods contain more than one serving per package.  Check the total grams (g) of carbohydrates in one serving. You can calculate the number of servings of carbohydrates in one serving by dividing the total carbohydrates by 15. For example, if a food has 30 g of total carbohydrates, it would be equal to 2 servings of carbohydrates.  Check the number of grams (g) of saturated and trans fats in one serving. Choose foods that have low or no amount of these fats.  Check the number of milligrams (mg) of sodium in one serving. Most people   should limit total sodium intake to less than 2,300 mg per day.  Always check the nutrition information of foods labeled as "low-fat" or "nonfat". These foods may be higher in added sugar or refined carbohydrates and should be avoided.  Talk to your dietitian to identify your daily goals for nutrients listed on the label. Shopping  Avoid buying canned, premade, or processed foods. These  foods tend to be high in fat, sodium, and added sugar.  Shop around the outside edge of the grocery store. This includes fresh fruits and vegetables, bulk grains, fresh meats, and fresh dairy. Cooking  Use low-heat cooking methods, such as baking, instead of high-heat cooking methods like deep frying.  Cook using healthy oils, such as olive, canola, or sunflower oil.  Avoid cooking with butter, cream, or high-fat meats. Meal planning  Eat meals and snacks regularly, preferably at the same times every day. Avoid going long periods of time without eating.  Eat foods high in fiber, such as fresh fruits, vegetables, beans, and whole grains. Talk to your dietitian about how many servings of carbohydrates you can eat at each meal.  Eat 4-6 ounces of lean protein each day, such as lean meat, chicken, fish, eggs, or tofu. 1 ounce is equal to 1 ounce of meat, chicken, or fish, 1 egg, or 1/4 cup of tofu.  Eat some foods each day that contain healthy fats, such as avocado, nuts, seeds, and fish. Lifestyle   Check your blood glucose regularly.  Exercise at least 30 minutes 5 or more days each week, or as told by your health care provider.  Take medicines as told by your health care provider.  Do not use any products that contain nicotine or tobacco, such as cigarettes and e-cigarettes. If you need help quitting, ask your health care provider.  Work with a counselor or diabetes educator to identify strategies to manage stress and any emotional and social challenges. What are some questions to ask my health care provider?  Do I need to meet with a diabetes educator?  Do I need to meet with a dietitian?  What number can I call if I have questions?  When are the best times to check my blood glucose? Where to find more information:  American Diabetes Association: diabetes.org/food-and-fitness/food  Academy of Nutrition and Dietetics:  www.eatright.org/resources/health/diseases-and-conditions/diabetes  National Institute of Diabetes and Digestive and Kidney Diseases (NIH): www.niddk.nih.gov/health-information/diabetes/overview/diet-eating-physical-activity Summary  A healthy meal plan will help you control your blood glucose and maintain a healthy lifestyle.  Working with a diet and nutrition specialist (dietitian) can help you make a meal plan that is best for you.  Keep in mind that carbohydrates and alcohol have immediate effects on your blood glucose levels. It is important to count carbohydrates and to use alcohol carefully. This information is not intended to replace advice given to you by your health care provider. Make sure you discuss any questions you have with your health care provider. Document Released: 11/17/2004 Document Revised: 03/27/2016 Document Reviewed: 03/27/2016 Elsevier Interactive Patient Education  2018 Elsevier Inc.  

## 2017-02-22 NOTE — Progress Notes (Signed)
Subjective:    Patient ID: [name], female    DOB: [birth year], 61 y.o.   MRN: [id number]  PT presents to the office today for chronic follow up.  Pt is followed by Cardiologists annually for CHF and aortic valve sclerosis that is stable.   Pt currently on dialysis Monday, Wednesday, and Friday and followed by nephrologists there. 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  Diabetes  She presents for her follow-up diabetic visit. She has type 2 diabetes mellitus. Her disease course has been worsening. There are no hypoglycemic associated symptoms. Associated symptoms include blurred vision, foot paresthesias and visual change. Symptoms are worsening. Diabetic complications include heart disease, nephropathy and peripheral neuropathy. Risk factors for coronary artery disease include diabetes mellitus, dyslipidemia, family history, hypertension, sedentary lifestyle and post-menopausal. She is following a generally healthy diet. Her breakfast blood glucose range is generally 130-140 mg/dl. Eye exam is current.  Hyperlipidemia  This is a chronic problem. The current episode started more than 1 year ago. The problem is controlled. Recent lipid tests were reviewed and are normal. Pertinent negatives include no shortness of breath. Current antihyperlipidemic treatment includes statins. The current treatment provides moderate improvement of lipids. Risk factors for coronary artery disease include diabetes mellitus, dyslipidemia, family history and hypertension.  Hypertension  This is a chronic problem. The current episode started more than 1 year ago. The problem has been resolved since onset. The problem is controlled. Associated symptoms include blurred vision. Pertinent negatives include no peripheral edema or shortness of breath. Risk factors for coronary artery disease include diabetes mellitus, family history, dyslipidemia and sedentary lifestyle. The current treatment provides moderate  improvement. Hypertensive end-organ damage includes kidney disease and heart failure.      Review of Systems  Eyes: Positive for blurred vision.  Respiratory: Negative for shortness of breath.   All other systems reviewed and are negative.      Objective:   Physical Exam  Constitutional: She is oriented to person, place, and time. She appears well-developed and well-nourished. No distress.  HENT:  Head: Normocephalic and atraumatic.  Right Ear: External ear normal.  Mouth/Throat: Oropharynx is clear and moist.  Eyes: Pupils are equal, round, and reactive to light.  Neck: Normal range of motion. Neck supple. No thyromegaly present.  Cardiovascular: Normal rate, regular rhythm, normal heart sounds and intact distal pulses.  No murmur heard. Pulmonary/Chest: Effort normal and breath sounds normal. No respiratory distress. She has no wheezes.  Abdominal: Soft. Bowel sounds are normal. She exhibits no distension. There is no tenderness.  Musculoskeletal: Normal range of motion. She exhibits no edema or tenderness.  Neurological: She is alert and oriented to person, place, and time.  Skin: Skin is warm and dry.  Psychiatric: She has a normal mood and affect. Her behavior is normal. Judgment and thought content normal.  Vitals reviewed.     BP 128/67   Pulse (!) 59   Temp (!) 97.4 F (36.3 C) (Oral)   Ht _0  (1.727 m)   Wt 136 lb (61.7 kg)   BMI 20.68 kg/m      Assessment & Plan:  1. Chronic diastolic heart failure (HCC) - CMP14+EGFR  2. Aortic valve sclerosis - CMP14+EGFR  3. Hypertension associated with diabetes ([place]) - CMP14+EGFR  4. Type 2 diabetes mellitus with hyperglycemia, with long-term current use of insulin (HCC) - Bayer DCA Hb A1c Waived - CMP14+EGFR  5. 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 Hyperlipidemia associated with type 2 diabetes mellitus ([place]) - CMP14+EGFR   Continue all meds Labs  pending Health Maintenance reviewed Diet and exercise encouraged RTO 3 months   Evelina Dun, FNP

## 2017-02-23 ENCOUNTER — Other Ambulatory Visit: Payer: Self-pay | Admitting: Family

## 2017-02-23 DIAGNOSIS — N186 End stage renal disease: Secondary | ICD-10-CM | POA: Diagnosis not present

## 2017-02-23 DIAGNOSIS — Z992 Dependence on renal dialysis: Secondary | ICD-10-CM | POA: Diagnosis not present

## 2017-02-23 LAB — CMP14+EGFR
ALK PHOS: 159 IU/L — AB (ref 39–117)
ALT: 103 IU/L — AB (ref 0–32)
AST: 88 IU/L — AB (ref 0–40)
Albumin/Globulin Ratio: 1.2 (ref 1.2–2.2)
Albumin: 3.9 g/dL (ref 3.6–4.8)
BILIRUBIN TOTAL: 0.4 mg/dL (ref 0.0–1.2)
BUN / CREAT RATIO: 5 — AB (ref 12–28)
BUN: 22 mg/dL (ref 8–27)
CHLORIDE: 98 mmol/L (ref 96–106)
CO2: 27 mmol/L (ref 20–29)
Calcium: 8.6 mg/dL — ABNORMAL LOW (ref 8.7–10.3)
Creatinine, Ser: 4.22 mg/dL (ref 0.57–1.00)
GFR calc Af Amer: 12 mL/min/{1.73_m2} — ABNORMAL LOW (ref >59)
GFR calc non Af Amer: 11 mL/min/{1.73_m2} — ABNORMAL LOW (ref >59)
GLUCOSE: 141 mg/dL — AB (ref 65–99)
Globulin, Total: 3.2 g/dL (ref 1.5–4.5)
Potassium: 4.6 mmol/L (ref 3.5–5.2)
Sodium: 140 mmol/L (ref 134–144)
Total Protein: 7.1 g/dL (ref 6.0–8.5)

## 2017-02-23 MED ORDER — DULAGLUTIDE 1.5 MG/0.5ML ~~LOC~~ SOAJ: 1.5000 mg | SUBCUTANEOUS | 1 refills | Status: DC

## 2017-02-23 MED ORDER — INSULIN DEGLUDEC 100 UNIT/ML ~~LOC~~ SOPN: 13.0000 [IU] | PEN_INJECTOR | Freq: Every day | SUBCUTANEOUS | 2 refills | Status: DC

## 2017-02-25 DIAGNOSIS — N186 End stage renal disease: Secondary | ICD-10-CM | POA: Diagnosis not present

## 2017-02-25 DIAGNOSIS — Z992 Dependence on renal dialysis: Secondary | ICD-10-CM | POA: Diagnosis not present

## 2017-02-28 DIAGNOSIS — Z992 Dependence on renal dialysis: Secondary | ICD-10-CM | POA: Diagnosis not present

## 2017-02-28 DIAGNOSIS — N186 End stage renal disease: Secondary | ICD-10-CM | POA: Diagnosis not present

## 2017-03-02 DIAGNOSIS — N186 End stage renal disease: Secondary | ICD-10-CM | POA: Diagnosis not present

## 2017-03-02 DIAGNOSIS — Z992 Dependence on renal dialysis: Secondary | ICD-10-CM | POA: Diagnosis not present

## 2017-03-03 ENCOUNTER — Emergency Department (HOSPITAL_COMMUNITY)
Admission: EM | Admit: 2017-03-03 | Discharge: 2017-03-03 | Disposition: A | Discharge status: Home / Self Care | Payer: BLUE CROSS/BLUE SHIELD | Attending: Emergency Medicine | Admitting: Emergency Medicine

## 2017-03-03 ENCOUNTER — Encounter (HOSPITAL_COMMUNITY): Payer: Self-pay | Admitting: Emergency Medicine

## 2017-03-03 ENCOUNTER — Other Ambulatory Visit: Payer: Self-pay

## 2017-03-03 DIAGNOSIS — E875 Hyperkalemia: Secondary | ICD-10-CM | POA: Insufficient documentation

## 2017-03-03 DIAGNOSIS — Z992 Dependence on renal dialysis: Secondary | ICD-10-CM | POA: Insufficient documentation

## 2017-03-03 DIAGNOSIS — I120 Hypertensive chronic kidney disease with stage 5 chronic kidney disease or end stage renal disease: Secondary | ICD-10-CM | POA: Diagnosis not present

## 2017-03-03 DIAGNOSIS — Z7982 Long term (current) use of aspirin: Secondary | ICD-10-CM | POA: Insufficient documentation

## 2017-03-03 DIAGNOSIS — I951 Orthostatic hypotension: Secondary | ICD-10-CM | POA: Diagnosis not present

## 2017-03-03 DIAGNOSIS — I2510 Atherosclerotic heart disease of native coronary artery without angina pectoris: Secondary | ICD-10-CM | POA: Diagnosis not present

## 2017-03-03 DIAGNOSIS — I110 Hypertensive heart disease with heart failure: Secondary | ICD-10-CM | POA: Insufficient documentation

## 2017-03-03 DIAGNOSIS — N186 End stage renal disease: Secondary | ICD-10-CM

## 2017-03-03 DIAGNOSIS — Z794 Long term (current) use of insulin: Secondary | ICD-10-CM | POA: Insufficient documentation

## 2017-03-03 DIAGNOSIS — R42 Dizziness and giddiness: Secondary | ICD-10-CM | POA: Diagnosis not present

## 2017-03-03 DIAGNOSIS — E1122 Type 2 diabetes mellitus with diabetic chronic kidney disease: Secondary | ICD-10-CM | POA: Diagnosis not present

## 2017-03-03 DIAGNOSIS — I5032 Chronic diastolic (congestive) heart failure: Secondary | ICD-10-CM | POA: Diagnosis not present

## 2017-03-03 LAB — CBC WITH DIFFERENTIAL/PLATELET
BASOS ABS: 0 10*3/uL (ref 0.0–0.1)
BASOS PCT: 0 %
EOS ABS: 0.3 10*3/uL (ref 0.0–0.7)
EOS PCT: 4 %
HCT: 32.8 % — ABNORMAL LOW (ref 36.0–46.0)
HEMOGLOBIN: 10.8 g/dL — AB (ref 12.0–15.0)
Lymphocytes Relative: 11 %
Lymphs Abs: 0.9 10*3/uL (ref 0.7–4.0)
MCH: 30.5 pg (ref 26.0–34.0)
MCHC: 32.9 g/dL (ref 30.0–36.0)
MCV: 92.7 fL (ref 78.0–100.0)
Monocytes Absolute: 0.3 10*3/uL (ref 0.1–1.0)
Monocytes Relative: 4 %
NEUTROS PCT: 81 %
Neutro Abs: 6.7 10*3/uL (ref 1.7–7.7)
PLATELETS: 203 10*3/uL (ref 150–400)
RBC: 3.54 MIL/uL — AB (ref 3.87–5.11)
RDW: 12.8 % (ref 11.5–15.5)
WBC: 8.2 10*3/uL (ref 4.0–10.5)

## 2017-03-03 LAB — COMPREHENSIVE METABOLIC PANEL
ALBUMIN: 3.6 g/dL (ref 3.5–5.0)
ALK PHOS: 159 U/L — AB (ref 38–126)
ALT: 61 U/L — AB (ref 14–54)
AST: 65 U/L — ABNORMAL HIGH (ref 15–41)
Anion gap: 10 (ref 5–15)
BUN: 42 mg/dL — ABNORMAL HIGH (ref 6–20)
CALCIUM: 8.3 mg/dL — AB (ref 8.9–10.3)
CHLORIDE: 95 mmol/L — AB (ref 101–111)
CO2: 29 mmol/L (ref 22–32)
CREATININE: 5.72 mg/dL — AB (ref 0.44–1.00)
GFR calc Af Amer: 8 mL/min — ABNORMAL LOW (ref >60)
GFR calc non Af Amer: 7 mL/min — ABNORMAL LOW (ref >60)
GLUCOSE: 279 mg/dL — AB (ref 65–99)
Potassium: 5.4 mmol/L — ABNORMAL HIGH (ref 3.5–5.1)
SODIUM: 134 mmol/L — AB (ref 135–145)
Total Bilirubin: 0.5 mg/dL (ref 0.3–1.2)
Total Protein: 7.5 g/dL (ref 6.5–8.1)

## 2017-03-03 LAB — CBG MONITORING, ED: GLUCOSE-CAPILLARY: 168 mg/dL — AB (ref 65–99)

## 2017-03-03 LAB — TSH: TSH: 3.862 u[IU]/mL (ref 0.350–4.500)

## 2017-03-03 MED ORDER — SODIUM POLYSTYRENE SULFONATE 15 GM/60ML PO SUSP
15.0000 g | Freq: Once | ORAL | Status: AC
Start: 2017-03-03 — End: 2017-03-03
  Administered 2017-03-03: 15 g via ORAL
  Filled 2017-03-03: qty 60

## 2017-03-03 NOTE — ED Provider Notes (Signed)
[facility] EMERGENCY DEPARTMENT Provider Note   CSN: [id number] Arrival date & time: 03/03/17  1226     History   Chief Complaint Chief Complaint  Patient presents with  . Dizziness    for three weeks    HPI [name] is a 61 y.o. female.  HPI Patient presents with lightheadedness.  Comes after standing.  States she is a dialysis patient attends be worse after dialysis.  States that it used to go away if she waited a little bit but now it has been continuing.  She has had a "better appetite.  No fevers or chills.  No nausea vomiting diarrhea.  Previous kidney transplant that failed is back on dialysis.  Due for dialysis tomorrow morning which is on a Sunday because of an upcoming holiday.  No chest pain.  No trouble breathing.  States they have actually increased her dry weight around a week ago.  States she is having the symptoms for around 3 weeks. Past Medical History:  Diagnosis Date  . Anemia    of chronic disease  . Blood transfusion without reported diagnosis   . Cardiovascular disease    nonobstructive  . Carotid artery stenosis 2008  . CHF (congestive heart failure) ([place])   . Coronary artery disease   . Diabetes mellitus   . Dyslipidemia   . Edema of lower extremity    with hypo-albuminemia and profound protenuria  . Heart murmur   . Hypertension   . Mitral regurgitation   . Nephrotic syndrome   . Patent foramen ovale   . Pulmonary hypertension, moderate to severe ([place])   . Pulmonary nodule   . Tricuspid regurgitation   . Volume depletion, renal, due to output loss (renal deficit)     Patient Active Problem List   Diagnosis Date Noted  . Hyperlipidemia associated with type 2 diabetes mellitus ([place]) 02/22/2017  . CKD (chronic kidney disease) requiring chronic dialysis ([place]) 08/08/2016  . 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 Encounter for aftercare following kidney transplant 05/22/2016  . Chronic diastolic heart failure ([place])  06/08/2015  . Aortic valve sclerosis 06/08/2015  . CKD (chronic kidney disease) stage 4, GFR 15-29 ml/min (HCC) 01/01/2015  . Anemia of chronic disease 07/03/2013  . Hypertension associated with diabetes ([place]) 12/18/2010  . Diabetes mellitus ([place]) 12/18/2010    Past Surgical History:  Procedure Laterality Date  . CARDIAC CATHETERIZATION  2008  . IR REMOVAL TUN CV CATH W/O FL  11/02/2016  . KIDNEY TRANSPLANT Right 02/23/2009    OB History    No data available       Home Medications    Prior to Admission medications   Medication Sig Start Date End Date Taking? Authorizing Provider  lidocaine-prilocaine (EMLA) cream  09/08/16  Yes [provider]  tacrolimus (PROGRAF) 1 MG capsule Take 1 mg by mouth every morning.   Yes [provider]  acetaminophen (TYLENOL) 500 MG tablet Take 500 mg by mouth. 04/06/16   [provider]  amLODipine (NORVASC) 10 MG tablet TAKE ONE TABLET BY MOUTH NIGHTLY 10/24/16   [provider]  aspirin (ASPIR-LOW) 81 MG EC tablet Take 81 mg by mouth daily. Swallow whole.    [provider]  Dulaglutide (TRULICITY) 1.5 DV/7.6HY SOPN Inject 1.5 mg into the skin once a week. Patient taking differently: Inject 1.5 mg into the skin once a week. On Saturdays 02/23/17   Evelina Dun A, FNP  insulin aspart (NOVOLOG) 100 UNIT/ML injection Inject subcutaneously  per sliding scale as directed three times a day with meals. 10 units - 5 units    [provider]  insulin degludec (TRESIBA FLEXTOUCH) 100 UNIT/ML SOPN FlexTouch Pen Inject 0.13 mLs (13 Units total) into the skin daily at 10 pm. 02/23/17   Sharion Balloon, FNP  isosorbide mononitrate (IMDUR) 60 MG 24 hr tablet  10/19/16   [provider]  metoprolol tartrate (LOPRESSOR) 25 MG tablet Take by mouth every morning.  10/23/16   [provider]  ondansetron (ZOFRAN-ODT) 4 MG disintegrating tablet Take 1 tablet (4 mg total) by mouth every 8 (eight) hours  as needed for nausea or vomiting. 11/21/16   Sharion Balloon, FNP  predniSONE (DELTASONE) 5 MG tablet  10/19/16   [provider]  PROGRAF 0.5 MG capsule Take 0.5 mg by mouth at bedtime.  10/24/16   [provider]  rosuvastatin (CRESTOR) 10 MG tablet Take 10 mg by mouth. 10/03/16   [provider]  sevelamer carbonate (RENVELA) 800 MG tablet HOLD until follow up with Nephrology.  Previous dose was take 1,600 mg by mouth 3 times daily with meals. (and 1 tablet by mouth with snacks) 10/02/16   [provider]  tacrolimus (PROGRAF) 1 MG capsule Take (1) 1mg  capsule in the morning daily and take (1) 0.5 mg capsule in the evening daily. 10/02/16   [provider]    Family History Family History  Problem Relation Age of Onset  . Kidney disease Mother   . Diabetes Mother   . Diabetes Father   . Heart disease Father     Social History Social History   Tobacco Use  . Smoking status: Never Smoker  . Smokeless tobacco: Never Used  Substance Use Topics  . Alcohol use: No  . Drug use: No     Allergies   Other   Review of Systems Review of Systems  Constitutional: Negative for appetite change and fever.  HENT: Negative for congestion.   Respiratory: Negative for shortness of breath.   Cardiovascular: Negative for chest pain.  Gastrointestinal: Negative for abdominal distention.  Genitourinary: Negative for flank pain.  Musculoskeletal: Negative for back pain.  Neurological: Positive for light-headedness.  Psychiatric/Behavioral: Negative for confusion.     Physical Exam Updated Vital Signs BP (!) 140/56   Pulse (!) 56   Temp 98.1 F (36.7 C) (Oral)   Resp 18   Ht 5\' 8"  (1.727 m)   Wt 61.7 kg (136 lb)   SpO2 100%   BMI 20.68 kg/m   Physical Exam  Constitutional: She appears well-developed and well-nourished.  HENT:  Head: Atraumatic.  Eyes: Pupils are equal, round, and reactive to light.  Neck: Neck supple.  Cardiovascular:  Normal rate.  Pulmonary/Chest: Effort normal.  Abdominal: Soft. There is no tenderness.  Musculoskeletal: She exhibits no edema or tenderness.  Neurological: She is alert.  Skin: Skin is warm. Capillary refill takes less than 2 seconds.  Psychiatric: She has a normal mood and affect.     ED Treatments / Results  Labs (all labs ordered are listed, but only abnormal results are displayed) Labs Reviewed  CBC WITH DIFFERENTIAL/PLATELET - Abnormal; Notable for the following components:      Result Value   RBC 3.54 (*)    Hemoglobin 10.8 (*)    HCT 32.8 (*)    All other components within normal limits  COMPREHENSIVE METABOLIC PANEL - Abnormal; Notable for the following components:   Sodium 134 (*)  Potassium 5.4 (*)    Chloride 95 (*)    Glucose, Bld 279 (*)    BUN 42 (*)    Creatinine, Ser 5.72 (*)    Calcium 8.3 (*)    AST 65 (*)    ALT 61 (*)    Alkaline Phosphatase 159 (*)    GFR calc non Af Amer 7 (*)    GFR calc Af Amer 8 (*)    All other components within normal limits  CBG MONITORING, ED - Abnormal; Notable for the following components:   Glucose-Capillary 168 (*)    All other components within normal limits  TSH    EKG  EKG Interpretation  Date/Time:  Saturday March 03 2017 12:52:35 EST Ventricular Rate:  59 PR Interval:  130 QRS Duration: 110 QT Interval:  504 QTC Calculation: 498 R Axis:   -21 Text Interpretation:  Sinus bradycardia Nonspecific ST and T wave abnormality Prolonged QT Abnormal ECG Confirmed by Davonna Belling [phone]) on 03/03/2017 3:18:18 PM       Radiology No results found.  Procedures Procedures (including critical care time)  Medications Ordered in ED Medications  sodium polystyrene (KAYEXALATE) 15 GM/60ML suspension 15 g (15 g Oral Given 03/03/17 1728)     Initial Impression / Assessment and Plan / ED Course  I have reviewed the triage vital signs and the nursing notes.  Pertinent labs & imaging results that were  available during my care of the patient were reviewed by me and considered in my medical decision making (see chart for details).     Patient with orthostatic hypotension.  Has had for the last 3 weeks.  Mild electrolyte abnormalities.  Mild bradycardia.  I think the bradycardia is likely unrelated to the mild hyperkalemia.  We will give Kayexalate and patient is due for dialysis in the morning.  At this point I think she needs more medication adjustments and I do not want to volume overload her at this time.  Will see her primary care doctor and nephrology and adjust her medications.  Discharge home.  Final Clinical Impressions(s) / ED Diagnoses   Final diagnoses:  Orthostatic hypotension  End stage renal disease on dialysis [facility])  Hyperkalemia    ED Discharge Orders    None       Davonna Belling, MD 03/03/17 1744

## 2017-03-03 NOTE — ED Triage Notes (Signed)
Pt is a dialysis pt and has been having increased dizziness for 3 weeks, especially after dialysis. Last dialysis was yesterday, also states blood sugar has been increased (200-300s). States dizziness is intermittent. Denies unilateral weakness, speech difficulties, no obvious droop or weakness noted.

## 2017-03-03 NOTE — Discharge Instructions (Signed)
Follow-up with your doctors for adjustment of your medications.  You were orthostatic here.  You are due for dialysis tomorrow and her potassium is mildly elevated.

## 2017-03-04 DIAGNOSIS — N186 End stage renal disease: Secondary | ICD-10-CM | POA: Diagnosis not present

## 2017-03-04 DIAGNOSIS — Z992 Dependence on renal dialysis: Secondary | ICD-10-CM | POA: Diagnosis not present

## 2017-03-05 DIAGNOSIS — Z992 Dependence on renal dialysis: Secondary | ICD-10-CM | POA: Diagnosis not present

## 2017-03-05 DIAGNOSIS — N186 End stage renal disease: Secondary | ICD-10-CM | POA: Diagnosis not present

## 2017-03-07 DIAGNOSIS — Z992 Dependence on renal dialysis: Secondary | ICD-10-CM | POA: Diagnosis not present

## 2017-03-07 DIAGNOSIS — N186 End stage renal disease: Secondary | ICD-10-CM | POA: Diagnosis not present

## 2017-03-09 DIAGNOSIS — N186 End stage renal disease: Secondary | ICD-10-CM | POA: Diagnosis not present

## 2017-03-09 DIAGNOSIS — Z992 Dependence on renal dialysis: Secondary | ICD-10-CM | POA: Diagnosis not present

## 2017-03-12 DIAGNOSIS — Z992 Dependence on renal dialysis: Secondary | ICD-10-CM | POA: Diagnosis not present

## 2017-03-12 DIAGNOSIS — N186 End stage renal disease: Secondary | ICD-10-CM | POA: Diagnosis not present

## 2017-03-13 ENCOUNTER — Other Ambulatory Visit: Payer: Self-pay

## 2017-03-13 DIAGNOSIS — N186 End stage renal disease: Secondary | ICD-10-CM

## 2017-03-13 DIAGNOSIS — Z992 Dependence on renal dialysis: Principal | ICD-10-CM

## 2017-03-13 DIAGNOSIS — N184 Chronic kidney disease, stage 4 (severe): Secondary | ICD-10-CM

## 2017-03-14 DIAGNOSIS — N186 End stage renal disease: Secondary | ICD-10-CM | POA: Diagnosis not present

## 2017-03-14 DIAGNOSIS — Z992 Dependence on renal dialysis: Secondary | ICD-10-CM | POA: Diagnosis not present

## 2017-03-16 DIAGNOSIS — N186 End stage renal disease: Secondary | ICD-10-CM | POA: Diagnosis not present

## 2017-03-16 DIAGNOSIS — Z992 Dependence on renal dialysis: Secondary | ICD-10-CM | POA: Diagnosis not present

## 2017-03-19 DIAGNOSIS — Z992 Dependence on renal dialysis: Secondary | ICD-10-CM | POA: Diagnosis not present

## 2017-03-19 DIAGNOSIS — N186 End stage renal disease: Secondary | ICD-10-CM | POA: Diagnosis not present

## 2017-03-21 DIAGNOSIS — Z992 Dependence on renal dialysis: Secondary | ICD-10-CM | POA: Diagnosis not present

## 2017-03-21 DIAGNOSIS — N186 End stage renal disease: Secondary | ICD-10-CM | POA: Diagnosis not present

## 2017-03-23 DIAGNOSIS — Z992 Dependence on renal dialysis: Secondary | ICD-10-CM | POA: Diagnosis not present

## 2017-03-23 DIAGNOSIS — N186 End stage renal disease: Secondary | ICD-10-CM | POA: Diagnosis not present

## 2017-03-26 DIAGNOSIS — Z992 Dependence on renal dialysis: Secondary | ICD-10-CM | POA: Diagnosis not present

## 2017-03-26 DIAGNOSIS — N186 End stage renal disease: Secondary | ICD-10-CM | POA: Diagnosis not present

## 2017-03-27 ENCOUNTER — Ambulatory Visit (HOSPITAL_COMMUNITY)
Admission: RE | Admit: 2017-03-27 | Discharge: 2017-03-27 | Disposition: A | Discharge status: Home / Self Care | Payer: BLUE CROSS/BLUE SHIELD | Source: Ambulatory Visit | Attending: Vascular Surgery | Admitting: Vascular Surgery

## 2017-03-27 ENCOUNTER — Other Ambulatory Visit: Payer: Self-pay | Admitting: *Deleted

## 2017-03-27 ENCOUNTER — Encounter: Payer: Self-pay | Admitting: Vascular Surgery

## 2017-03-27 ENCOUNTER — Ambulatory Visit (INDEPENDENT_AMBULATORY_CARE_PROVIDER_SITE_OTHER): Payer: BLUE CROSS/BLUE SHIELD | Admitting: Vascular Surgery

## 2017-03-27 ENCOUNTER — Encounter: Payer: Self-pay | Admitting: *Deleted

## 2017-03-27 VITALS — BP 140/62 | HR 61 | Temp 97.3°F | Resp 18 | Ht 68.0 in | Wt 140.5 lb

## 2017-03-27 DIAGNOSIS — N186 End stage renal disease: Secondary | ICD-10-CM | POA: Insufficient documentation

## 2017-03-27 DIAGNOSIS — N184 Chronic kidney disease, stage 4 (severe): Secondary | ICD-10-CM

## 2017-03-27 DIAGNOSIS — R9389 Abnormal findings on diagnostic imaging of other specified body structures: Secondary | ICD-10-CM | POA: Diagnosis not present

## 2017-03-27 DIAGNOSIS — Z992 Dependence on renal dialysis: Secondary | ICD-10-CM | POA: Diagnosis not present

## 2017-03-27 NOTE — Progress Notes (Signed)
Patient name: [name] MRN: [id number] DOB: [birth year] Sex: female   REASON FOR CONSULT:    Poorly functioning left radiocephalic AV fistula.  The consult is requested by Dr. Lowanda Foster.  HPI:   [name] is a pleasant 62 y.o. female, who had a left radiocephalic fistula placed in [place] on 04/06/2016.  She dialyzes on Monday Wednesdays and Fridays.  The fistula has not been functioning well and they have been using smaller needles for this.  She denies any uremic symptoms.  Specifically, she denies nausea, vomiting, fatigue, anorexia, or palpitations.  She does describe some paresthesias in her left upper arm but not in her distal forearm or hand.  She denies any neck pain or shoulder pain.  Her chronic kidney disease is related to diabetes and hypertension.  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  Past Medical History:  Diagnosis Date  . Anemia    of chronic disease  . Blood transfusion without reported diagnosis   . Cardiovascular disease    nonobstructive  . Carotid artery stenosis 2008  . CHF (congestive heart failure) ([place])   . Coronary artery disease   . Diabetes mellitus   . Dyslipidemia   . Edema of lower extremity    with hypo-albuminemia and profound protenuria  . Heart murmur   . Hypertension   . Mitral regurgitation   . Nephrotic syndrome   . Patent foramen ovale   . Pulmonary hypertension, moderate to severe ([place])   . Pulmonary nodule   . Tricuspid regurgitation   . Volume depletion, renal, due to output loss (renal deficit)     Family History  Problem Relation Age of Onset  . Kidney disease Mother   . Diabetes Mother   . Diabetes Father   . Heart disease Father     SOCIAL HISTORY: Social History   Socioeconomic History  . Marital status: Married    Spouse name: Not on file  . Number of children: Not on file  . Years of education: Not on file  . Highest education  level: Not on file  Social Needs  . Financial resource strain: Not on file  . Food insecurity - worry: Not on file  . Food insecurity - inability: Not on file  . Transportation needs - medical: Not on file  . Transportation needs - non-medical: Not on file  Occupational History  . Not on file  Tobacco Use  . Smoking status: Never Smoker  . Smokeless tobacco: Never Used  Substance and Sexual Activity  . Alcohol use: No  . Drug use: No  . Sexual activity: No  Other Topics Concern  . Not on file  Social History Narrative  . Not on file    Allergies  Allergen Reactions  . Other Other (See Comments)    Bruises and tears skin Paper tape tolerated    Current Outpatient Medications  Medication Sig Dispense Refill  . acetaminophen (TYLENOL) 500 MG tablet Take 500 mg by mouth.    Marland Kitchen amLODipine (NORVASC) 10 MG tablet TAKE ONE TABLET BY MOUTH NIGHTLY    . aspirin (ASPIR-LOW) 81 MG EC tablet Take 81 mg by mouth daily. Swallow whole.    . Dulaglutide (TRULICITY) 1.5 HY/8.6VH SOPN Inject 1.5 mg into the skin once a week. (Patient taking differently: Inject 1.5 mg into the skin once a week. On Saturdays) 12 pen 1  . insulin aspart (NOVOLOG) 100 UNIT/ML injection  Inject subcutaneously per sliding scale as directed three times a day with meals. 10 units - 5 units    . insulin degludec (TRESIBA FLEXTOUCH) 100 UNIT/ML SOPN FlexTouch Pen Inject 0.13 mLs (13 Units total) into the skin daily at 10 pm. 12 pen 2  . isosorbide mononitrate (IMDUR) 60 MG 24 hr tablet   1  . lidocaine-prilocaine (EMLA) cream   0  . metoprolol tartrate (LOPRESSOR) 25 MG tablet Take by mouth every morning.   1  . ondansetron (ZOFRAN-ODT) 4 MG disintegrating tablet Take 1 tablet (4 mg total) by mouth every 8 (eight) hours as needed for nausea or vomiting. 20 tablet 2  . predniSONE (DELTASONE) 5 MG tablet   1  . PROGRAF 0.5 MG capsule Take 0.5 mg by mouth at bedtime.   0  . rosuvastatin (CRESTOR) 10 MG tablet Take 10 mg by  mouth.    . sevelamer carbonate (RENVELA) 800 MG tablet HOLD until follow up with Nephrology.  Previous dose was take 1,600 mg by mouth 3 times daily with meals. (and 1 tablet by mouth with snacks)    . tacrolimus (PROGRAF) 1 MG capsule Take (1) 1mg  capsule in the morning daily and take (1) 0.5 mg capsule in the evening daily.     No current facility-administered medications for this visit.     REVIEW OF SYSTEMS:  [X]  denotes positive finding, [ ]  denotes negative finding Cardiac  Comments:  Chest pain or chest pressure:    Shortness of breath upon exertion:    Short of breath when lying flat:    Irregular heart rhythm:        Vascular    Pain in calf, thigh, or hip brought on by ambulation:    Pain in feet at night that wakes you up from your sleep:     Blood clot in your veins:    Leg swelling:         Pulmonary    Oxygen at home:    Productive cough:     Wheezing:         Neurologic    Sudden weakness in arms or legs:     Sudden numbness in arms or legs:     Sudden onset of difficulty speaking or slurred speech:    Temporary loss of vision in one eye:     Problems with dizziness:         Gastrointestinal    Blood in stool:     Vomited blood:         Genitourinary    Burning when urinating:     Blood in urine:        Psychiatric    Major depression:         Hematologic    Bleeding problems:    Problems with blood clotting too easily:        Skin    Rashes or ulcers:        Constitutional    Fever or chills:     PHYSICAL EXAM:   Vitals:   03/27/17 0954  BP: 140/62  Pulse: 61  Resp: 18  Temp: (!) 97.3 F (36.3 C)  TempSrc: Oral  SpO2: 99%  Weight: 140 lb 8 oz (63.7 kg)  Height: 5\' 8"  (1.727 m)    GENERAL: The patient is a well-nourished female, in no acute distress. The vital signs are documented above. CARDIAC: There is a regular rate and rhythm.  VASCULAR: She has a palpable thrill in  her left radiocephalic AV fistula.  This is diminished.  The  fistula is not pulsatile.  She also has a hematoma in the mid upper arm that is small.  I cannot palpate a radial pulse distal to her fistula. PULMONARY: There is good air exchange bilaterally without wheezing or rales. NEUROLOGIC: No focal weakness or paresthesias are detected. SKIN: There are no ulcers or rashes noted. PSYCHIATRIC: The patient has a normal affect.  DATA:    DUPLEX AV FISTULA: I have independently interpreted the duplex of her left radiocephalic AV fistula.  There is an area of narrowing in the proximal fistula in the distal forearm with markedly elevated velocities.  The diameter in this area is 0.2 cm.  Above that the fistula is patent although small.  MEDICAL ISSUES:   POORLY FUNCTIONING LEFT RADIOCEPHALIC AV FISTULA: Before abandoning this fistula I think we should proceed with a fistulogram to see if the stenosis in the proximal fistula is amenable to venoplasty.  If the fistula could not be salvaged then she will have to be evaluated for an upper arm fistula on the left or an AV graft.  She dialyzes on Monday Wednesdays and Fridays and will arrange for her fistulogram on a Tuesday or Thursday.  I have discussed the procedure and potential complications and she is agreeable to the.  We will make further recommendations pending these results.  Deitra Mayo [facility] [phone]

## 2017-03-27 NOTE — H&P (View-Only) (Signed)
Patient name: [name] MRN: [id number] DOB: Jun 21, 1955 Sex: female   REASON FOR CONSULT:    Poorly functioning left radiocephalic AV fistula.  The consult is requested by Dr. Lowanda Foster.  HPI:   [name] is a pleasant 62 y.o. female, who had a left radiocephalic fistula placed in [place] on 04/06/2016.  She dialyzes on Monday Wednesdays and Fridays.  The fistula has not been functioning well and they have been using smaller needles for this.  She denies any uremic symptoms.  Specifically, she denies nausea, vomiting, fatigue, anorexia, or palpitations.  She does describe some paresthesias in her left upper arm but not in her distal forearm or hand.  She denies any neck pain or shoulder pain.  Her chronic kidney disease is related to diabetes and hypertension.  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  Past Medical History:  Diagnosis Date  . Anemia    of chronic disease  . Blood transfusion without reported diagnosis   . Cardiovascular disease    nonobstructive  . Carotid artery stenosis 2008  . CHF (congestive heart failure) ([place])   . Coronary artery disease   . Diabetes mellitus   . Dyslipidemia   . Edema of lower extremity    with hypo-albuminemia and profound protenuria  . Heart murmur   . Hypertension   . Mitral regurgitation   . Nephrotic syndrome   . Patent foramen ovale   . Pulmonary hypertension, moderate to severe ([place])   . Pulmonary nodule   . Tricuspid regurgitation   . Volume depletion, renal, due to output loss (renal deficit)     Family History  Problem Relation Age of Onset  . Kidney disease Mother   . Diabetes Mother   . Diabetes Father   . Heart disease Father     SOCIAL HISTORY: Social History   Socioeconomic History  . Marital status: Married    Spouse name: Not on file  . Number of children: Not on file  . Years of education: Not on file  . Highest education  level: Not on file  Social Needs  . Financial resource strain: Not on file  . Food insecurity - worry: Not on file  . Food insecurity - inability: Not on file  . Transportation needs - medical: Not on file  . Transportation needs - non-medical: Not on file  Occupational History  . Not on file  Tobacco Use  . Smoking status: Never Smoker  . Smokeless tobacco: Never Used  Substance and Sexual Activity  . Alcohol use: No  . Drug use: No  . Sexual activity: No  Other Topics Concern  . Not on file  Social History Narrative  . Not on file    Allergies  Allergen Reactions  . Other Other (See Comments)    Bruises and tears skin Paper tape tolerated    Current Outpatient Medications  Medication Sig Dispense Refill  . acetaminophen (TYLENOL) 500 MG tablet Take 500 mg by mouth.    Marland Kitchen amLODipine (NORVASC) 10 MG tablet TAKE ONE TABLET BY MOUTH NIGHTLY    . aspirin (ASPIR-LOW) 81 MG EC tablet Take 81 mg by mouth daily. Swallow whole.    . Dulaglutide (TRULICITY) 1.5 NK/5.3ZJ SOPN Inject 1.5 mg into the skin once a week. (Patient taking differently: Inject 1.5 mg into the skin once a week. On Saturdays) 12 pen 1  . insulin aspart (NOVOLOG) 100 UNIT/ML injection  Inject subcutaneously per sliding scale as directed three times a day with meals. 10 units - 5 units    . insulin degludec (TRESIBA FLEXTOUCH) 100 UNIT/ML SOPN FlexTouch Pen Inject 0.13 mLs (13 Units total) into the skin daily at 10 pm. 12 pen 2  . isosorbide mononitrate (IMDUR) 60 MG 24 hr tablet   1  . lidocaine-prilocaine (EMLA) cream   0  . metoprolol tartrate (LOPRESSOR) 25 MG tablet Take by mouth every morning.   1  . ondansetron (ZOFRAN-ODT) 4 MG disintegrating tablet Take 1 tablet (4 mg total) by mouth every 8 (eight) hours as needed for nausea or vomiting. 20 tablet 2  . predniSONE (DELTASONE) 5 MG tablet   1  . PROGRAF 0.5 MG capsule Take 0.5 mg by mouth at bedtime.   0  . rosuvastatin (CRESTOR) 10 MG tablet Take 10 mg by  mouth.    . sevelamer carbonate (RENVELA) 800 MG tablet HOLD until follow up with Nephrology.  Previous dose was take 1,600 mg by mouth 3 times daily with meals. (and 1 tablet by mouth with snacks)    . tacrolimus (PROGRAF) 1 MG capsule Take (1) 1mg  capsule in the morning daily and take (1) 0.5 mg capsule in the evening daily.     No current facility-administered medications for this visit.     REVIEW OF SYSTEMS:  [X]  denotes positive finding, [ ]  denotes negative finding Cardiac  Comments:  Chest pain or chest pressure:    Shortness of breath upon exertion:    Short of breath when lying flat:    Irregular heart rhythm:        Vascular    Pain in calf, thigh, or hip brought on by ambulation:    Pain in feet at night that wakes you up from your sleep:     Blood clot in your veins:    Leg swelling:         Pulmonary    Oxygen at home:    Productive cough:     Wheezing:         Neurologic    Sudden weakness in arms or legs:     Sudden numbness in arms or legs:     Sudden onset of difficulty speaking or slurred speech:    Temporary loss of vision in one eye:     Problems with dizziness:         Gastrointestinal    Blood in stool:     Vomited blood:         Genitourinary    Burning when urinating:     Blood in urine:        Psychiatric    Major depression:         Hematologic    Bleeding problems:    Problems with blood clotting too easily:        Skin    Rashes or ulcers:        Constitutional    Fever or chills:     PHYSICAL EXAM:   Vitals:   03/27/17 0954  BP: 140/62  Pulse: 61  Resp: 18  Temp: (!) 97.3 F (36.3 C)  TempSrc: Oral  SpO2: 99%  Weight: 140 lb 8 oz (63.7 kg)  Height: 5\' 8"  (1.727 m)    GENERAL: The patient is a well-nourished female, in no acute distress. The vital signs are documented above. CARDIAC: There is a regular rate and rhythm.  VASCULAR: She has a palpable thrill in  her left radiocephalic AV fistula.  This is diminished.  The  fistula is not pulsatile.  She also has a hematoma in the mid upper arm that is small.  I cannot palpate a radial pulse distal to her fistula. PULMONARY: There is good air exchange bilaterally without wheezing or rales. NEUROLOGIC: No focal weakness or paresthesias are detected. SKIN: There are no ulcers or rashes noted. PSYCHIATRIC: The patient has a normal affect.  DATA:    DUPLEX AV FISTULA: I have independently interpreted the duplex of her left radiocephalic AV fistula.  There is an area of narrowing in the proximal fistula in the distal forearm with markedly elevated velocities.  The diameter in this area is 0.2 cm.  Above that the fistula is patent although small.  MEDICAL ISSUES:   POORLY FUNCTIONING LEFT RADIOCEPHALIC AV FISTULA: Before abandoning this fistula I think we should proceed with a fistulogram to see if the stenosis in the proximal fistula is amenable to venoplasty.  If the fistula could not be salvaged then she will have to be evaluated for an upper arm fistula on the left or an AV graft.  She dialyzes on Monday Wednesdays and Fridays and will arrange for her fistulogram on a Tuesday or Thursday.  I have discussed the procedure and potential complications and she is agreeable to the.  We will make further recommendations pending these results.  Deitra Mayo [facility] [phone]

## 2017-03-28 DIAGNOSIS — Z992 Dependence on renal dialysis: Secondary | ICD-10-CM | POA: Diagnosis not present

## 2017-03-28 DIAGNOSIS — N186 End stage renal disease: Secondary | ICD-10-CM | POA: Diagnosis not present

## 2017-03-30 DIAGNOSIS — Z992 Dependence on renal dialysis: Secondary | ICD-10-CM | POA: Diagnosis not present

## 2017-03-30 DIAGNOSIS — N186 End stage renal disease: Secondary | ICD-10-CM | POA: Diagnosis not present

## 2017-04-02 DIAGNOSIS — Z992 Dependence on renal dialysis: Secondary | ICD-10-CM | POA: Diagnosis not present

## 2017-04-02 DIAGNOSIS — N186 End stage renal disease: Secondary | ICD-10-CM | POA: Diagnosis not present

## 2017-04-03 ENCOUNTER — Ambulatory Visit: Payer: BLUE CROSS/BLUE SHIELD | Admitting: Family

## 2017-04-03 ENCOUNTER — Encounter: Payer: Self-pay | Admitting: Family

## 2017-04-03 ENCOUNTER — Ambulatory Visit (INDEPENDENT_AMBULATORY_CARE_PROVIDER_SITE_OTHER): Payer: BLUE CROSS/BLUE SHIELD

## 2017-04-03 VITALS — BP 117/57 | HR 75 | Temp 98.0°F | Ht 68.0 in | Wt 136.8 lb

## 2017-04-03 DIAGNOSIS — A084 Viral intestinal infection, unspecified: Secondary | ICD-10-CM

## 2017-04-03 DIAGNOSIS — R531 Weakness: Secondary | ICD-10-CM

## 2017-04-03 DIAGNOSIS — R509 Fever, unspecified: Secondary | ICD-10-CM

## 2017-04-03 DIAGNOSIS — R6889 Other general symptoms and signs: Secondary | ICD-10-CM

## 2017-04-03 LAB — VERITOR FLU A/B WAIVED
Influenza A: NEGATIVE
Influenza B: NEGATIVE

## 2017-04-03 MED ORDER — ISOSORBIDE MONONITRATE ER 60 MG PO TB24: 60.0000 mg | ORAL_TABLET | Freq: Every day | ORAL | 1 refills | Status: DC

## 2017-04-03 MED ORDER — ONDANSETRON 4 MG PO TBDP: 4.0000 mg | ORAL_TABLET | Freq: Three times a day (TID) | ORAL | 2 refills | Status: DC | PRN

## 2017-04-03 MED ORDER — ROSUVASTATIN CALCIUM 20 MG PO TABS: 20.0000 mg | ORAL_TABLET | Freq: Every day | ORAL | 1 refills | Status: DC

## 2017-04-03 MED ORDER — PREDNISONE 5 MG PO TABS: 5.0000 mg | ORAL_TABLET | Freq: Every day | ORAL | 1 refills | Status: DC

## 2017-04-03 NOTE — Patient Instructions (Signed)

## 2017-04-03 NOTE — Progress Notes (Signed)
   Subjective:    Patient ID: [name], female    DOB: 13-Mar-1955, 62 y.o.   MRN: [id number]  Pt presents to the office today with emesis and fever. Daughter is requesting chest x-ray today, because she states her mother is "prone" to pneumonia.  Emesis   This is a new problem. The current episode started in the past 7 days. The problem occurs 2 to 4 times per day. The problem has been gradually improving. The emesis has an appearance of stomach contents. There has been no fever. Associated symptoms include chills, dizziness, a fever and myalgias. Pertinent negatives include no coughing or diarrhea. She has tried bed rest, increased fluids and sleep for the symptoms. The treatment provided mild relief.      Review of Systems  Constitutional: Positive for chills and fever.  Respiratory: Negative for cough.   Gastrointestinal: Positive for vomiting. Negative for diarrhea.  Musculoskeletal: Positive for myalgias.  Neurological: Positive for dizziness.  All other systems reviewed and are negative.      Objective:   Physical Exam  Constitutional: She is oriented to person, place, and time. She appears well-developed and well-nourished. No distress.  HENT:  Head: Normocephalic and atraumatic.  Right Ear: External ear normal.  Mouth/Throat: Oropharynx is clear and moist.  Eyes: Pupils are equal, round, and reactive to light.  Neck: Normal range of motion. Neck supple. No thyromegaly present.  Cardiovascular: Normal rate, regular rhythm, normal heart sounds and intact distal pulses.  No murmur heard. Pulmonary/Chest: Effort normal and breath sounds normal. No respiratory distress. She has no wheezes.  Abdominal: Soft. Bowel sounds are normal. She exhibits no distension. There is no tenderness.  Musculoskeletal: Normal range of motion. She exhibits no edema or tenderness.  Neurological: She is alert and oriented to person, place, and time.  Skin: Skin is warm and dry.  Psychiatric:  She has a normal mood and affect. Her behavior is normal. Judgment and thought content normal.  Vitals reviewed.     BP (!) 117/57   Pulse 75   Temp 98 F (36.7 C) (Oral)   Ht 5\' 8"  (1.727 m)   Wt 136 lb 12.8 oz (62.1 kg)   BMI 20.80 kg/m      Assessment & Plan:  1. Flu-like symptoms - Veritor Flu A/B Waived  2. Viral gastroenteritis Pt on dialysis with ESRD and on fluid restrictions. Discussed if she is throwing up to increase fluids accordingly  Criss Rosales diet Zofran as needed RTO prn  - ondansetron (ZOFRAN-ODT) 4 MG disintegrating tablet; Take 1 tablet (4 mg total) by mouth every 8 (eight) hours as needed for nausea or vomiting.  Dispense: 20 tablet; Refill: 2   3. Fever, unspecified fever cause - DG Chest 2 View; Future  4. Weakness Rest Tylenol prn  - DG Chest 2 View; Future    Evelina Dun, FNP

## 2017-04-04 DIAGNOSIS — Z992 Dependence on renal dialysis: Secondary | ICD-10-CM | POA: Diagnosis not present

## 2017-04-04 DIAGNOSIS — N186 End stage renal disease: Secondary | ICD-10-CM | POA: Diagnosis not present

## 2017-04-05 ENCOUNTER — Ambulatory Visit (HOSPITAL_COMMUNITY)
Admission: RE | Admit: 2017-04-05 | Discharge: 2017-04-05 | Disposition: A | Discharge status: Home / Self Care | Payer: BLUE CROSS/BLUE SHIELD | Source: Ambulatory Visit | Attending: Vascular Surgery | Admitting: Vascular Surgery

## 2017-04-05 ENCOUNTER — Telehealth: Payer: Self-pay | Admitting: Vascular Surgery

## 2017-04-05 ENCOUNTER — Encounter (HOSPITAL_COMMUNITY): Payer: Self-pay | Admitting: Vascular Surgery

## 2017-04-05 ENCOUNTER — Ambulatory Visit (HOSPITAL_COMMUNITY)
Admission: RE | Disposition: A | Discharge status: Home / Self Care | Payer: Self-pay | Source: Ambulatory Visit | Attending: Vascular Surgery

## 2017-04-05 DIAGNOSIS — N186 End stage renal disease: Secondary | ICD-10-CM | POA: Diagnosis not present

## 2017-04-05 DIAGNOSIS — E1122 Type 2 diabetes mellitus with diabetic chronic kidney disease: Secondary | ICD-10-CM | POA: Diagnosis not present

## 2017-04-05 DIAGNOSIS — I272 Pulmonary hypertension, unspecified: Secondary | ICD-10-CM | POA: Insufficient documentation

## 2017-04-05 DIAGNOSIS — I509 Heart failure, unspecified: Secondary | ICD-10-CM | POA: Diagnosis not present

## 2017-04-05 DIAGNOSIS — I081 Rheumatic disorders of both mitral and tricuspid valves: Secondary | ICD-10-CM | POA: Diagnosis not present

## 2017-04-05 DIAGNOSIS — Y832 Surgical operation with anastomosis, bypass or graft as the cause of abnormal reaction of the patient, or of later complication, without mention of misadventure at the time of the procedure: Secondary | ICD-10-CM | POA: Diagnosis not present

## 2017-04-05 DIAGNOSIS — Z794 Long term (current) use of insulin: Secondary | ICD-10-CM | POA: Diagnosis not present

## 2017-04-05 DIAGNOSIS — T82858A Stenosis of vascular prosthetic devices, implants and grafts, initial encounter: Secondary | ICD-10-CM | POA: Diagnosis not present

## 2017-04-05 DIAGNOSIS — Z7982 Long term (current) use of aspirin: Secondary | ICD-10-CM | POA: Insufficient documentation

## 2017-04-05 DIAGNOSIS — Z992 Dependence on renal dialysis: Secondary | ICD-10-CM | POA: Diagnosis not present

## 2017-04-05 DIAGNOSIS — I2510 Atherosclerotic heart disease of native coronary artery without angina pectoris: Secondary | ICD-10-CM | POA: Diagnosis not present

## 2017-04-05 DIAGNOSIS — E785 Hyperlipidemia, unspecified: Secondary | ICD-10-CM | POA: Insufficient documentation

## 2017-04-05 DIAGNOSIS — I132 Hypertensive heart and chronic kidney disease with heart failure and with stage 5 chronic kidney disease, or end stage renal disease: Secondary | ICD-10-CM | POA: Diagnosis not present

## 2017-04-05 HISTORY — PX: A/V FISTULAGRAM: CATH118298

## 2017-04-05 HISTORY — PX: A/V SHUNT INTERVENTION: CATH118220

## 2017-04-05 LAB — POCT I-STAT, CHEM 8
BUN: 37 mg/dL — ABNORMAL HIGH (ref 6–20)
CALCIUM ION: 0.91 mmol/L — AB (ref 1.15–1.40)
CHLORIDE: 103 mmol/L (ref 101–111)
Creatinine, Ser: 5.4 mg/dL — ABNORMAL HIGH (ref 0.44–1.00)
GLUCOSE: 85 mg/dL (ref 65–99)
HCT: 39 % (ref 36.0–46.0)
Hemoglobin: 13.3 g/dL (ref 12.0–15.0)
Potassium: 4.1 mmol/L (ref 3.5–5.1)
Sodium: 141 mmol/L (ref 135–145)
TCO2: 27 mmol/L (ref 22–32)

## 2017-04-05 LAB — GLUCOSE, CAPILLARY: GLUCOSE-CAPILLARY: 80 mg/dL (ref 65–99)

## 2017-04-05 SURGERY — A/V FISTULAGRAM
Anesthesia: LOCAL

## 2017-04-05 MED ORDER — OXYCODONE-ACETAMINOPHEN 5-325 MG PO TABS
1.0000 | ORAL_TABLET | ORAL | Status: DC | PRN
  Administered 2017-04-05: 2 via ORAL
  Filled 2017-04-05: qty 2

## 2017-04-05 MED ORDER — IODIXANOL 320 MG/ML IV SOLN
INTRAVENOUS | Status: DC | PRN
  Administered 2017-04-05: 50 mL via INTRAVENOUS

## 2017-04-05 MED ORDER — OXYCODONE-ACETAMINOPHEN 5-325 MG PO TABS
ORAL_TABLET | ORAL | Status: AC
  Filled 2017-04-05: qty 2

## 2017-04-05 MED ORDER — HEPARIN SODIUM (PORCINE) 1000 UNIT/ML IJ SOLN
INTRAMUSCULAR | Status: DC | PRN
Start: 2017-04-05 — End: 2017-04-05
  Administered 2017-04-05: 4000 [IU] via INTRAVENOUS

## 2017-04-05 MED ORDER — LIDOCAINE HCL 1 % IJ SOLN
INTRAMUSCULAR | Status: AC
  Filled 2017-04-05: qty 20

## 2017-04-05 MED ORDER — SODIUM CHLORIDE 0.9% FLUSH: 3.0000 mL | Freq: Two times a day (BID) | INTRAVENOUS | Status: DC

## 2017-04-05 MED ORDER — MIDAZOLAM HCL 2 MG/2ML IJ SOLN
INTRAMUSCULAR | Status: DC | PRN
  Administered 2017-04-05: 1 mg via INTRAVENOUS

## 2017-04-05 MED ORDER — HEPARIN (PORCINE) IN NACL 2-0.9 UNIT/ML-% IJ SOLN
INTRAMUSCULAR | Status: AC
  Filled 2017-04-05: qty 500

## 2017-04-05 MED ORDER — SODIUM CHLORIDE 0.9 % IV SOLN: 250.0000 mL | INTRAVENOUS | Status: DC | PRN

## 2017-04-05 MED ORDER — SODIUM CHLORIDE 0.9% FLUSH: 3.0000 mL | INTRAVENOUS | Status: DC | PRN

## 2017-04-05 MED ORDER — MIDAZOLAM HCL 2 MG/2ML IJ SOLN
INTRAMUSCULAR | Status: AC
  Filled 2017-04-05: qty 2

## 2017-04-05 MED ORDER — FENTANYL CITRATE (PF) 100 MCG/2ML IJ SOLN
INTRAMUSCULAR | Status: AC
  Filled 2017-04-05: qty 2

## 2017-04-05 MED ORDER — LIDOCAINE HCL (PF) 1 % IJ SOLN
INTRAMUSCULAR | Status: DC | PRN
  Administered 2017-04-05: 2 mL

## 2017-04-05 MED ORDER — HEPARIN (PORCINE) IN NACL 2-0.9 UNIT/ML-% IJ SOLN
INTRAMUSCULAR | Status: AC | PRN
  Administered 2017-04-05: 500 mL

## 2017-04-05 MED ORDER — FENTANYL CITRATE (PF) 100 MCG/2ML IJ SOLN
INTRAMUSCULAR | Status: DC | PRN
  Administered 2017-04-05 (×2): 50 ug via INTRAVENOUS

## 2017-04-05 SURGICAL SUPPLY — 20 items
BAG SNAP BAND KOVER 36X36 (MISCELLANEOUS) ×3 IMPLANT
BALLN MUSTANG 4.0X40 75 (BALLOONS) ×3
BALLN MUSTANG 5.0X40 75 (BALLOONS) ×3
BALLOON MUSTANG 4.0X40 75 (BALLOONS) ×2 IMPLANT
BALLOON MUSTANG 5.0X40 75 (BALLOONS) ×2 IMPLANT
CATH SOFT-VU 4F 65 STRAIGHT (CATHETERS) ×2 IMPLANT
CATH SOFT-VU STRAIGHT 4F 65CM (CATHETERS) ×1
COVER DOME SNAP 22 D (MISCELLANEOUS) ×3 IMPLANT
COVER PRB 48X5XTLSCP FOLD TPE (BAG) ×4 IMPLANT
COVER PROBE 5X48 (BAG) ×2
KIT ENCORE 26 ADVANTAGE (KITS) ×3 IMPLANT
KIT MICROINTRODUCER STIFF 5F (SHEATH) ×3 IMPLANT
PROTECTION STATION PRESSURIZED (MISCELLANEOUS) ×3
SHEATH PINNACLE R/O II 6F 4CM (SHEATH) ×3 IMPLANT
STATION PROTECTION PRESSURIZED (MISCELLANEOUS) ×2 IMPLANT
STOPCOCK MORSE 400PSI 3WAY (MISCELLANEOUS) ×3 IMPLANT
TRAY PV CATH (CUSTOM PROCEDURE TRAY) ×3 IMPLANT
TUBING CIL FLEX 10 FLL-RA (TUBING) ×6 IMPLANT
WIRE BENTSON .035X145CM (WIRE) ×3 IMPLANT
WIRE SPARTACORE .014X190CM (WIRE) ×6 IMPLANT

## 2017-04-05 NOTE — Interval H&P Note (Signed)
History and Physical Interval Note:  04/05/2017 7:27 AM  [name]  has presented today for surgery, with the diagnosis of low flow  The various methods of treatment have been discussed with the patient and family. After consideration of risks, benefits and other options for treatment, the patient has consented to  Procedure(s): A/V FISTULAGRAM - Left Arm (N/A) as a surgical intervention .  The patient's history has been reviewed, patient examined, no change in status, stable for surgery.  I have reviewed the patient's chart and labs.  Questions were answered to the patient's satisfaction.     Deitra Mayo

## 2017-04-05 NOTE — Op Note (Signed)
   PATIENT: [name]      MRN: [id number] DOB: 1955-09-28    DATE OF PROCEDURE: 04/05/2017  INDICATIONS:    [name] is a 62 y.o. female who presents with a poorly functioning left radiocephalic AV fistula.  PROCEDURE:    1.  Ultrasound-guided access to left radiocephalic AV fistula 2.  Fistulogram left radiocephalic AV fistula 3.  Venoplasty of stenosis of left radiocephalic AV fistula  SURGEON: Judeth Cornfield. Scot Dock, MD, FACS  ANESTHESIA: Local with sedation  EBL: Minimal  TECHNIQUE: The patient was taken to the peripheral vascular lab and was sedated. The period of conscious sedation was 50 to the right minutes.  During that time period, I was present face-to-face 100% of the time.  The patient was administered 1 mg of Versed and 50 mcg of fentanyl. The patient's heart rate, blood pressure, and oxygen saturation were monitored by the nurse continuously during the procedure.  The left arm was prepped and draped in usual sterile fashion.  Under ultrasound guidance, after the skin was anesthetized, the fistula was cannulated in the mid upper arm in a retrograde fashion.  A micropuncture sheath was introduced over the wire.  A fistulogram was then obtained evaluating the vein from the site of cannulation to include the central veins.  Next a blood pressure cuff was inflated on the upper arm and a retrograde fistulogram obtained to demonstrate the proximal fistula and arterial anastomosis.  There was a long segment stenosis of the proximal fistula.  I elected to address this with venoplasty.  I advanced the [place] core wire through the stenosis into the radial artery.  I then passed a straight catheter and exchange the [place] core wire for a [place] wire.  I selected initially a 4 mm x 4 cm balloon which was positioned across the stenosis into the artery and inflated to 10 atm for 1 minute.  Follow-up film showed some residual stenosis.  I went back with a 5 mm x 4 cm balloon which  was positioned outside of the radial artery and this was inflated to 12 atm for 1-1/2 minutes.  Final film showed no residual stenosis.  A 4-0 Monocryl was placed around the cannulation site.  Pressure was held for hemostasis.  No immediate complications were noted.   FINDINGS:   1.  Long segment 80% stenosis of proximal left radiocephalic fistula.  This was successfully addressed with balloon venoplasty.   2.  No other significant problems with the fistula are identified.  The cephalic vein empties into the basilic vein in the upper arm.  The upper arm cephalic vein is not identified. 3.  There is no central venous stenosis.  PRE: 80% stenosis POST: Less than 15% residual stenosis STENT: No  Deitra Mayo, MD, FACS [facility] [place]  DATE OF DICTATION:   04/05/2017

## 2017-04-05 NOTE — Discharge Instructions (Signed)

## 2017-04-05 NOTE — Telephone Encounter (Signed)
Sched appt 05/16/17; lab at 2:00 and MD at 3:00. Called cell#, someone picked up but there was interference so we could not understand each other and then the call dropped. Lm on pt's hm# to inform them of appt.

## 2017-04-05 NOTE — Telephone Encounter (Signed)
-----   Message from Mena Goes, RN sent at 04/05/2017  9:41 AM EST ----- Regarding: 6 weeks w/ duplex   ----- Message ----- From: Angelia Mould, MD Sent: 04/05/2017   8:45 AM To: [facility] Charge Pool Subject: charge                                          PROCEDURE:   1.  Ultrasound-guided access to left radiocephalic AV fistula 2.  Fistulogram left radiocephalic AV fistula 3.  Venoplasty of stenosis of left radiocephalic AV fistula  SURGEON: Judeth Cornfield. Scot Dock, MD, FACS  ANESTHESIA: Local with sedation  She can have a follow-up duplex and office visit in 6 weeks.  Thank you.

## 2017-04-06 DIAGNOSIS — Z992 Dependence on renal dialysis: Secondary | ICD-10-CM | POA: Diagnosis not present

## 2017-04-06 DIAGNOSIS — N186 End stage renal disease: Secondary | ICD-10-CM | POA: Diagnosis not present

## 2017-04-06 DIAGNOSIS — R1110 Vomiting, unspecified: Secondary | ICD-10-CM | POA: Diagnosis not present

## 2017-04-06 MED FILL — Lidocaine HCl Local Inj 1%: INTRAMUSCULAR | Qty: 20 | Status: AC

## 2017-04-07 DIAGNOSIS — N186 End stage renal disease: Secondary | ICD-10-CM | POA: Diagnosis not present

## 2017-04-07 DIAGNOSIS — Z992 Dependence on renal dialysis: Secondary | ICD-10-CM | POA: Diagnosis not present

## 2017-04-07 DIAGNOSIS — R1110 Vomiting, unspecified: Secondary | ICD-10-CM | POA: Diagnosis not present

## 2017-04-09 ENCOUNTER — Other Ambulatory Visit: Payer: Self-pay

## 2017-04-09 DIAGNOSIS — R1110 Vomiting, unspecified: Secondary | ICD-10-CM | POA: Diagnosis not present

## 2017-04-09 DIAGNOSIS — Z992 Dependence on renal dialysis: Secondary | ICD-10-CM | POA: Diagnosis not present

## 2017-04-09 DIAGNOSIS — N186 End stage renal disease: Secondary | ICD-10-CM

## 2017-04-09 DIAGNOSIS — N184 Chronic kidney disease, stage 4 (severe): Secondary | ICD-10-CM

## 2017-04-09 DIAGNOSIS — Z48812 Encounter for surgical aftercare following surgery on the circulatory system: Secondary | ICD-10-CM

## 2017-04-11 DIAGNOSIS — N186 End stage renal disease: Secondary | ICD-10-CM | POA: Diagnosis not present

## 2017-04-11 DIAGNOSIS — R1110 Vomiting, unspecified: Secondary | ICD-10-CM | POA: Diagnosis not present

## 2017-04-11 DIAGNOSIS — Z992 Dependence on renal dialysis: Secondary | ICD-10-CM | POA: Diagnosis not present

## 2017-04-13 DIAGNOSIS — N186 End stage renal disease: Secondary | ICD-10-CM | POA: Diagnosis not present

## 2017-04-13 DIAGNOSIS — R1110 Vomiting, unspecified: Secondary | ICD-10-CM | POA: Diagnosis not present

## 2017-04-13 DIAGNOSIS — Z992 Dependence on renal dialysis: Secondary | ICD-10-CM | POA: Diagnosis not present

## 2017-04-16 DIAGNOSIS — Z992 Dependence on renal dialysis: Secondary | ICD-10-CM | POA: Diagnosis not present

## 2017-04-16 DIAGNOSIS — N186 End stage renal disease: Secondary | ICD-10-CM | POA: Diagnosis not present

## 2017-04-16 DIAGNOSIS — R1110 Vomiting, unspecified: Secondary | ICD-10-CM | POA: Diagnosis not present

## 2017-04-18 DIAGNOSIS — Z992 Dependence on renal dialysis: Secondary | ICD-10-CM | POA: Diagnosis not present

## 2017-04-18 DIAGNOSIS — N186 End stage renal disease: Secondary | ICD-10-CM | POA: Diagnosis not present

## 2017-04-18 DIAGNOSIS — R1110 Vomiting, unspecified: Secondary | ICD-10-CM | POA: Diagnosis not present

## 2017-04-20 DIAGNOSIS — N186 End stage renal disease: Secondary | ICD-10-CM | POA: Diagnosis not present

## 2017-04-20 DIAGNOSIS — Z992 Dependence on renal dialysis: Secondary | ICD-10-CM | POA: Diagnosis not present

## 2017-04-20 DIAGNOSIS — R1110 Vomiting, unspecified: Secondary | ICD-10-CM | POA: Diagnosis not present

## 2017-04-23 DIAGNOSIS — N186 End stage renal disease: Secondary | ICD-10-CM | POA: Diagnosis not present

## 2017-04-23 DIAGNOSIS — R1110 Vomiting, unspecified: Secondary | ICD-10-CM | POA: Diagnosis not present

## 2017-04-23 DIAGNOSIS — Z992 Dependence on renal dialysis: Secondary | ICD-10-CM | POA: Diagnosis not present

## 2017-04-24 DIAGNOSIS — M542 Cervicalgia: Secondary | ICD-10-CM | POA: Diagnosis not present

## 2017-04-24 DIAGNOSIS — I130 Hypertensive heart and chronic kidney disease with heart failure and stage 1 through stage 4 chronic kidney disease, or unspecified chronic kidney disease: Secondary | ICD-10-CM | POA: Diagnosis not present

## 2017-04-24 DIAGNOSIS — M1990 Unspecified osteoarthritis, unspecified site: Secondary | ICD-10-CM | POA: Diagnosis not present

## 2017-04-24 DIAGNOSIS — I509 Heart failure, unspecified: Secondary | ICD-10-CM | POA: Diagnosis not present

## 2017-04-24 DIAGNOSIS — M79602 Pain in left arm: Secondary | ICD-10-CM | POA: Diagnosis not present

## 2017-04-24 DIAGNOSIS — Z992 Dependence on renal dialysis: Secondary | ICD-10-CM | POA: Diagnosis not present

## 2017-04-24 DIAGNOSIS — E1122 Type 2 diabetes mellitus with diabetic chronic kidney disease: Secondary | ICD-10-CM | POA: Diagnosis not present

## 2017-04-24 DIAGNOSIS — N189 Chronic kidney disease, unspecified: Secondary | ICD-10-CM | POA: Diagnosis not present

## 2017-04-24 DIAGNOSIS — N186 End stage renal disease: Secondary | ICD-10-CM | POA: Diagnosis not present

## 2017-04-24 DIAGNOSIS — Z79899 Other long term (current) drug therapy: Secondary | ICD-10-CM | POA: Diagnosis not present

## 2017-04-24 DIAGNOSIS — Z794 Long term (current) use of insulin: Secondary | ICD-10-CM | POA: Diagnosis not present

## 2017-04-24 DIAGNOSIS — R079 Chest pain, unspecified: Secondary | ICD-10-CM | POA: Diagnosis not present

## 2017-04-24 DIAGNOSIS — I214 Non-ST elevation (NSTEMI) myocardial infarction: Secondary | ICD-10-CM | POA: Diagnosis not present

## 2017-04-24 DIAGNOSIS — Z7982 Long term (current) use of aspirin: Secondary | ICD-10-CM | POA: Diagnosis not present

## 2017-04-24 DIAGNOSIS — Z940 Kidney transplant status: Secondary | ICD-10-CM | POA: Diagnosis not present

## 2017-04-25 DIAGNOSIS — Z992 Dependence on renal dialysis: Secondary | ICD-10-CM | POA: Diagnosis not present

## 2017-04-25 DIAGNOSIS — R1110 Vomiting, unspecified: Secondary | ICD-10-CM | POA: Diagnosis not present

## 2017-04-25 DIAGNOSIS — N186 End stage renal disease: Secondary | ICD-10-CM | POA: Diagnosis not present

## 2017-04-27 DIAGNOSIS — Z992 Dependence on renal dialysis: Secondary | ICD-10-CM | POA: Diagnosis not present

## 2017-04-27 DIAGNOSIS — R1110 Vomiting, unspecified: Secondary | ICD-10-CM | POA: Diagnosis not present

## 2017-04-27 DIAGNOSIS — N186 End stage renal disease: Secondary | ICD-10-CM | POA: Diagnosis not present

## 2017-04-30 DIAGNOSIS — N186 End stage renal disease: Secondary | ICD-10-CM | POA: Diagnosis not present

## 2017-04-30 DIAGNOSIS — Z992 Dependence on renal dialysis: Secondary | ICD-10-CM | POA: Diagnosis not present

## 2017-04-30 DIAGNOSIS — R1110 Vomiting, unspecified: Secondary | ICD-10-CM | POA: Diagnosis not present

## 2017-05-02 DIAGNOSIS — N186 End stage renal disease: Secondary | ICD-10-CM | POA: Diagnosis not present

## 2017-05-02 DIAGNOSIS — R1110 Vomiting, unspecified: Secondary | ICD-10-CM | POA: Diagnosis not present

## 2017-05-02 DIAGNOSIS — Z992 Dependence on renal dialysis: Secondary | ICD-10-CM | POA: Diagnosis not present

## 2017-05-03 DIAGNOSIS — Z992 Dependence on renal dialysis: Secondary | ICD-10-CM | POA: Diagnosis not present

## 2017-05-03 DIAGNOSIS — N186 End stage renal disease: Secondary | ICD-10-CM | POA: Diagnosis not present

## 2017-05-04 DIAGNOSIS — N186 End stage renal disease: Secondary | ICD-10-CM | POA: Diagnosis not present

## 2017-05-04 DIAGNOSIS — Z992 Dependence on renal dialysis: Secondary | ICD-10-CM | POA: Diagnosis not present

## 2017-05-07 DIAGNOSIS — Z992 Dependence on renal dialysis: Secondary | ICD-10-CM | POA: Diagnosis not present

## 2017-05-07 DIAGNOSIS — N186 End stage renal disease: Secondary | ICD-10-CM | POA: Diagnosis not present

## 2017-05-08 DIAGNOSIS — I5032 Chronic diastolic (congestive) heart failure: Secondary | ICD-10-CM | POA: Diagnosis not present

## 2017-05-09 DIAGNOSIS — Z992 Dependence on renal dialysis: Secondary | ICD-10-CM | POA: Diagnosis not present

## 2017-05-09 DIAGNOSIS — N186 End stage renal disease: Secondary | ICD-10-CM | POA: Diagnosis not present

## 2017-05-11 DIAGNOSIS — N186 End stage renal disease: Secondary | ICD-10-CM | POA: Diagnosis not present

## 2017-05-11 DIAGNOSIS — Z992 Dependence on renal dialysis: Secondary | ICD-10-CM | POA: Diagnosis not present

## 2017-05-14 DIAGNOSIS — Z992 Dependence on renal dialysis: Secondary | ICD-10-CM | POA: Diagnosis not present

## 2017-05-14 DIAGNOSIS — N186 End stage renal disease: Secondary | ICD-10-CM | POA: Diagnosis not present

## 2017-05-15 ENCOUNTER — Ambulatory Visit (HOSPITAL_COMMUNITY)
Admission: RE | Admit: 2017-05-15 | Discharge: 2017-05-15 | Disposition: A | Discharge status: Home / Self Care | Payer: BLUE CROSS/BLUE SHIELD | Source: Ambulatory Visit | Attending: Vascular Surgery | Admitting: Vascular Surgery

## 2017-05-15 DIAGNOSIS — Z992 Dependence on renal dialysis: Secondary | ICD-10-CM | POA: Diagnosis not present

## 2017-05-15 DIAGNOSIS — N186 End stage renal disease: Secondary | ICD-10-CM | POA: Diagnosis not present

## 2017-05-15 DIAGNOSIS — N184 Chronic kidney disease, stage 4 (severe): Secondary | ICD-10-CM | POA: Diagnosis not present

## 2017-05-15 DIAGNOSIS — Z48812 Encounter for surgical aftercare following surgery on the circulatory system: Secondary | ICD-10-CM | POA: Diagnosis not present

## 2017-05-16 ENCOUNTER — Ambulatory Visit (INDEPENDENT_AMBULATORY_CARE_PROVIDER_SITE_OTHER): Payer: BLUE CROSS/BLUE SHIELD | Admitting: Physician Assistant

## 2017-05-16 ENCOUNTER — Encounter (HOSPITAL_COMMUNITY): Payer: BLUE CROSS/BLUE SHIELD

## 2017-05-16 ENCOUNTER — Encounter: Payer: BLUE CROSS/BLUE SHIELD | Admitting: Vascular Surgery

## 2017-05-16 VITALS — BP 130/64 | HR 69 | Temp 97.5°F | Resp 18 | Ht 69.0 in | Wt 139.0 lb

## 2017-05-16 DIAGNOSIS — N186 End stage renal disease: Secondary | ICD-10-CM

## 2017-05-16 DIAGNOSIS — Z992 Dependence on renal dialysis: Secondary | ICD-10-CM

## 2017-05-16 NOTE — Progress Notes (Signed)
    Postoperative Access Visit   History of Present Illness   [name] is a 62 y.o. year old female who presents for postoperative follow-up for: Evaluation of left radiocephalic fistula 6 weeks status post fistulogram with venoplasty of proximal fistula stenosis by Dr. Scot Dock on 04/05/17.  Since fistulogram with venoplasty patient believes the overall function and performance of the fistula has improved.  She states that the dialysis techs have been able to use a larger bore needle during treatments and she has been able to complete 3-4-hour hemodialysis treatments as scheduled over the past 6 weeks.  Overall, patient believes they are having less difficulty cannulating fistula however occasionally they do need to reposition their needles.  She denies signs or symptoms of a steal syndrome in her left hand.  She dialyzes on a Monday Wednesday Friday schedule.  Surgical history significant for right radiocephalic fistula which has since occluded.  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   Physical Examination   Vitals:   05/16/17 1452  BP: 130/64  Pulse: 69  Resp: 18  Temp: (!) 97.5 F (36.4 C)  TempSrc: Oral  SpO2: 98%  Weight: 139 lb (63 kg)  Height: 5\' 9"  (1.753 m)   Body mass index is 20.53 kg/m.  left arm hand grip is 5/5, sensation in digits is intact, palpable thrill to the level of the upper forearm, bruit can be auscultated     Fistula duplex demonstrating persistent anastomosis stenosis of 496 cm/s as well as outflow vein stenosis and distal forearm of 561 cm/s  Medical Decision Making   [name] is a 62 y.o. year old female who presents s/p fistulogram of left radiocephalic fistula with venoplasty of proximal fistula   Overall patient seems to be having less difficulty with dialysis via left radiocephalic fistula since fistulogram 6 weeks  prior  Okay to continue dialysis via left radiocephalic fistula despite persistent elevated velocities near the anastomosis and distal outflow vein  This fistulogram was performed to salvage the access and patient is aware she still might require new dialysis access in the future  This case will be discussed with Dr. Scot Dock and note will be addended with any changes to treatment plan  Dagoberto Ligas, PA-C [facility] [place] Office: [phone]

## 2017-05-18 DIAGNOSIS — Z992 Dependence on renal dialysis: Secondary | ICD-10-CM | POA: Diagnosis not present

## 2017-05-18 DIAGNOSIS — N186 End stage renal disease: Secondary | ICD-10-CM | POA: Diagnosis not present

## 2017-05-21 DIAGNOSIS — N186 End stage renal disease: Secondary | ICD-10-CM | POA: Diagnosis not present

## 2017-05-21 DIAGNOSIS — Z992 Dependence on renal dialysis: Secondary | ICD-10-CM | POA: Diagnosis not present

## 2017-05-23 DIAGNOSIS — N186 End stage renal disease: Secondary | ICD-10-CM | POA: Diagnosis not present

## 2017-05-23 DIAGNOSIS — Z992 Dependence on renal dialysis: Secondary | ICD-10-CM | POA: Diagnosis not present

## 2017-05-24 ENCOUNTER — Ambulatory Visit: Payer: BLUE CROSS/BLUE SHIELD | Admitting: Family

## 2017-05-24 ENCOUNTER — Encounter: Payer: Self-pay | Admitting: Family

## 2017-05-24 VITALS — BP 112/59 | HR 63 | Temp 97.2°F | Ht 69.0 in | Wt 138.0 lb

## 2017-05-24 DIAGNOSIS — E1159 Type 2 diabetes mellitus with other circulatory complications: Secondary | ICD-10-CM | POA: Diagnosis not present

## 2017-05-24 DIAGNOSIS — I5032 Chronic diastolic (congestive) heart failure: Secondary | ICD-10-CM | POA: Diagnosis not present

## 2017-05-24 DIAGNOSIS — N184 Chronic kidney disease, stage 4 (severe): Secondary | ICD-10-CM

## 2017-05-24 DIAGNOSIS — Z992 Dependence on renal dialysis: Secondary | ICD-10-CM | POA: Diagnosis not present

## 2017-05-24 DIAGNOSIS — Z1159 Encounter for screening for other viral diseases: Secondary | ICD-10-CM

## 2017-05-24 DIAGNOSIS — Z23 Encounter for immunization: Secondary | ICD-10-CM

## 2017-05-24 DIAGNOSIS — D638 Anemia in other chronic diseases classified elsewhere: Secondary | ICD-10-CM | POA: Diagnosis not present

## 2017-05-24 DIAGNOSIS — N186 End stage renal disease: Secondary | ICD-10-CM | POA: Diagnosis not present

## 2017-05-24 DIAGNOSIS — E785 Hyperlipidemia, unspecified: Secondary | ICD-10-CM | POA: Diagnosis not present

## 2017-05-24 DIAGNOSIS — I358 Other nonrheumatic aortic valve disorders: Secondary | ICD-10-CM | POA: Diagnosis not present

## 2017-05-24 DIAGNOSIS — Z794 Long term (current) use of insulin: Secondary | ICD-10-CM

## 2017-05-24 DIAGNOSIS — E1165 Type 2 diabetes mellitus with hyperglycemia: Secondary | ICD-10-CM

## 2017-05-24 DIAGNOSIS — I10 Essential (primary) hypertension: Secondary | ICD-10-CM

## 2017-05-24 DIAGNOSIS — E1169 Type 2 diabetes mellitus with other specified complication: Secondary | ICD-10-CM

## 2017-05-24 DIAGNOSIS — I152 Hypertension secondary to endocrine disorders: Secondary | ICD-10-CM

## 2017-05-24 DIAGNOSIS — Z1211 Encounter for screening for malignant neoplasm of colon: Secondary | ICD-10-CM

## 2017-05-24 LAB — BAYER DCA HB A1C WAIVED: HB A1C (BAYER DCA - WAIVED): 5.9 % (ref <7.0)

## 2017-05-24 NOTE — Progress Notes (Signed)
Subjective:    Patient ID: [name], female    DOB: 02-14-1956, 62 y.o.   MRN: [id number]  PT presents to the office today for chronic follow up.  Pt is followed by Cardiologists annually for CHF and aortic valve sclerosis that is stable.   Pt currently on dialysis Monday, Wednesday, and Friday and followed by nephrologists there. 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 Diabetes  She presents for her follow-up diabetic visit. She has type 2 diabetes mellitus. There are no hypoglycemic associated symptoms. Associated symptoms include foot paresthesias. Pertinent negatives for diabetes include no foot ulcerations and no visual change. Blurred vision: 'sometimes" There are no hypoglycemic complications. Symptoms are stable. Diabetic complications include heart disease and nephropathy. Pertinent negatives for diabetic complications include no CVA. Risk factors for coronary artery disease include dyslipidemia, diabetes mellitus, family history, hypertension, sedentary lifestyle and post-menopausal. Her weight is decreasing steadily. Her overall blood glucose range is 70-90 mg/dl. Eye exam is current.  Hypertension  This is a chronic problem. The current episode started more than 1 year ago. The problem has been resolved since onset. The problem is controlled. Associated symptoms include malaise/fatigue. Pertinent negatives include no palpitations, peripheral edema or shortness of breath. Blurred vision: 'sometimes" Risk factors for coronary artery disease include dyslipidemia, diabetes mellitus, family history, post-menopausal state and sedentary lifestyle. The current treatment provides moderate improvement. Hypertensive end-organ damage includes kidney disease and CAD/MI. There is no history of CVA.  Hyperlipidemia  This is a chronic problem. The current episode started more than 1 year ago. The problem is controlled. Recent lipid tests were reviewed and are normal. Pertinent negatives  include no shortness of breath. Current antihyperlipidemic treatment includes statins. The current treatment provides moderate improvement of lipids.  Anemia  Presents for follow-up visit. Symptoms include light-headedness and malaise/fatigue. There has been no bruising/bleeding easily or palpitations.      Review of Systems  Constitutional: Positive for malaise/fatigue.  Eyes: Blurred vision: 'sometimes"  Respiratory: Negative for shortness of breath.   Cardiovascular: Negative for palpitations and leg swelling.  Neurological: Positive for light-headedness.  Hematological: Does not bruise/bleed easily.  All other systems reviewed and are negative.      Objective:   Physical Exam  Constitutional: She is oriented to person, place, and time. She appears well-developed and well-nourished. No distress.  HENT:  Head: Normocephalic and atraumatic.  Right Ear: External ear normal.  Left Ear: External ear normal.  Nose: Nose normal.  Mouth/Throat: Oropharynx is clear and moist.  Eyes: Pupils are equal, round, and reactive to light.  Neck: Normal range of motion. Neck supple. No thyromegaly present.  Cardiovascular: Normal rate, regular rhythm, normal heart sounds and intact distal pulses.  No murmur heard. Pulmonary/Chest: Effort normal and breath sounds normal. No respiratory distress. She has no wheezes.  Abdominal: Soft. Bowel sounds are normal. She exhibits no distension. There is no tenderness.  Musculoskeletal: Normal range of motion. She exhibits no edema or tenderness.  Generalized weakness  Neurological: She is alert and oriented to person, place, and time.  Skin: Skin is warm and dry.  Psychiatric: She has a normal mood and affect. Her behavior is normal. Judgment and thought content normal.  Vitals reviewed.     BP (!) 112/59   Pulse 63   Temp (!) 97.2 F (36.2 C) (Oral)   Ht '5\' 9"'$  (1.753 m)   Wt 138 lb (62.6 kg)   BMI 20.38 kg/m  Assessment & Plan:  1.  Hypertension associated with diabetes ([place]) - CMP14+EGFR  2. Chronic diastolic heart failure (HCC) - CMP14+EGFR  3. 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 CKD (chronic kidney disease) stage 4, GFR 15-29 ml/min (HCC) - CMP14+EGFR  5. Type 2 diabetes mellitus with hyperglycemia, with long-term current use of insulin (HCC) - CMP14+EGFR - Bayer DCA Hb A1c Waived  6. Hyperlipidemia associated with type 2 diabetes mellitus (HCC) - CMP14+EGFR - Lipid panel  7. Anemia of chronic disease  - Anemia Profile B - CMP14+EGFR  8. Aortic valve sclerosis - CMP14+EGFR  9. Colon cancer screening  - CMP14+EGFR - Fecal occult blood, imunochemical; Future  10. Need for hepatitis C screening test  - Hepatitis C antibody   Continue all meds Labs pending Health Maintenance reviewed Diet and exercise encouraged RTO 6 months   Evelina Dun, FNP

## 2017-05-24 NOTE — Patient Instructions (Signed)

## 2017-05-24 NOTE — Addendum Note (Signed)
Addended by: Shelbie Ammons on: 05/24/2017 12:46 PM   Modules accepted: Orders

## 2017-05-25 ENCOUNTER — Other Ambulatory Visit: Payer: Self-pay | Admitting: Family

## 2017-05-25 DIAGNOSIS — N186 End stage renal disease: Secondary | ICD-10-CM | POA: Diagnosis not present

## 2017-05-25 DIAGNOSIS — Z992 Dependence on renal dialysis: Secondary | ICD-10-CM | POA: Diagnosis not present

## 2017-05-25 LAB — LIPID PANEL
Chol/HDL Ratio: 3.7 ratio (ref 0.0–4.4)
Cholesterol, Total: 108 mg/dL (ref 100–199)
HDL: 29 mg/dL — AB (ref >39)
LDL Calculated: 46 mg/dL (ref 0–99)
Triglycerides: 163 mg/dL — ABNORMAL HIGH (ref 0–149)
VLDL Cholesterol Cal: 33 mg/dL (ref 5–40)

## 2017-05-25 LAB — ANEMIA PROFILE B
BASOS: 1 %
Basophils Absolute: 0 10*3/uL (ref 0.0–0.2)
EOS (ABSOLUTE): 0.6 10*3/uL — AB (ref 0.0–0.4)
Eos: 8 %
FERRITIN: 1632 ng/mL — AB (ref 15–150)
Folate: 5.2 ng/mL (ref >3.0)
HEMATOCRIT: 36.9 % (ref 34.0–46.6)
Hemoglobin: 12.1 g/dL (ref 11.1–15.9)
IRON SATURATION: 49 % (ref 15–55)
Immature Grans (Abs): 0 10*3/uL (ref 0.0–0.1)
Immature Granulocytes: 0 %
Iron: 101 ug/dL (ref 27–139)
Lymphocytes Absolute: 1.1 10*3/uL (ref 0.7–3.1)
Lymphs: 13 %
MCH: 30.6 pg (ref 26.6–33.0)
MCHC: 32.8 g/dL (ref 31.5–35.7)
MCV: 93 fL (ref 79–97)
MONOS ABS: 0.5 10*3/uL (ref 0.1–0.9)
Monocytes: 6 %
NEUTROS ABS: 6.1 10*3/uL (ref 1.4–7.0)
Neutrophils: 72 %
Platelets: 194 10*3/uL (ref 150–379)
RBC: 3.96 x10E6/uL (ref 3.77–5.28)
RDW: 13.7 % (ref 12.3–15.4)
Retic Ct Pct: 0.9 % (ref 0.6–2.6)
Total Iron Binding Capacity: 205 ug/dL — ABNORMAL LOW (ref 250–450)
UIBC: 104 ug/dL — ABNORMAL LOW (ref 118–369)
VITAMIN B 12: 404 pg/mL (ref 232–1245)
WBC: 8.4 10*3/uL (ref 3.4–10.8)

## 2017-05-25 LAB — HEPATITIS C ANTIBODY

## 2017-05-25 LAB — CMP14+EGFR
A/G RATIO: 1.1 — AB (ref 1.2–2.2)
ALT: 30 IU/L (ref 0–32)
AST: 26 IU/L (ref 0–40)
Albumin: 3.7 g/dL (ref 3.6–4.8)
Alkaline Phosphatase: 190 IU/L — ABNORMAL HIGH (ref 39–117)
BILIRUBIN TOTAL: 0.5 mg/dL (ref 0.0–1.2)
BUN/Creatinine Ratio: 5 — ABNORMAL LOW (ref 12–28)
BUN: 30 mg/dL — ABNORMAL HIGH (ref 8–27)
CHLORIDE: 96 mmol/L (ref 96–106)
CO2: 24 mmol/L (ref 20–29)
Calcium: 9.2 mg/dL (ref 8.7–10.3)
Creatinine, Ser: 5.54 mg/dL (ref 0.57–1.00)
GFR calc Af Amer: 9 mL/min/{1.73_m2} — ABNORMAL LOW (ref >59)
GFR calc non Af Amer: 8 mL/min/{1.73_m2} — ABNORMAL LOW (ref >59)
GLOBULIN, TOTAL: 3.4 g/dL (ref 1.5–4.5)
Glucose: 198 mg/dL — ABNORMAL HIGH (ref 65–99)
POTASSIUM: 5.3 mmol/L — AB (ref 3.5–5.2)
SODIUM: 137 mmol/L (ref 134–144)
Total Protein: 7.1 g/dL (ref 6.0–8.5)

## 2017-05-28 DIAGNOSIS — N186 End stage renal disease: Secondary | ICD-10-CM | POA: Diagnosis not present

## 2017-05-28 DIAGNOSIS — Z992 Dependence on renal dialysis: Secondary | ICD-10-CM | POA: Diagnosis not present

## 2017-05-29 DIAGNOSIS — R197 Diarrhea, unspecified: Secondary | ICD-10-CM | POA: Diagnosis not present

## 2017-05-29 DIAGNOSIS — R1011 Right upper quadrant pain: Secondary | ICD-10-CM | POA: Diagnosis not present

## 2017-05-29 DIAGNOSIS — Z940 Kidney transplant status: Secondary | ICD-10-CM | POA: Diagnosis not present

## 2017-05-29 DIAGNOSIS — R740 Nonspecific elevation of levels of transaminase and lactic acid dehydrogenase [LDH]: Secondary | ICD-10-CM | POA: Diagnosis not present

## 2017-05-29 DIAGNOSIS — E860 Dehydration: Secondary | ICD-10-CM | POA: Diagnosis not present

## 2017-05-29 DIAGNOSIS — R748 Abnormal levels of other serum enzymes: Secondary | ICD-10-CM | POA: Diagnosis not present

## 2017-05-29 DIAGNOSIS — R1084 Generalized abdominal pain: Secondary | ICD-10-CM | POA: Diagnosis not present

## 2017-05-29 DIAGNOSIS — R05 Cough: Secondary | ICD-10-CM | POA: Diagnosis not present

## 2017-05-29 DIAGNOSIS — R112 Nausea with vomiting, unspecified: Secondary | ICD-10-CM | POA: Diagnosis not present

## 2017-05-29 DIAGNOSIS — R1110 Vomiting, unspecified: Secondary | ICD-10-CM | POA: Diagnosis not present

## 2017-05-29 DIAGNOSIS — R509 Fever, unspecified: Secondary | ICD-10-CM | POA: Diagnosis not present

## 2017-05-30 ENCOUNTER — Encounter (HOSPITAL_COMMUNITY): Payer: Self-pay | Admitting: Emergency Medicine

## 2017-05-30 ENCOUNTER — Other Ambulatory Visit: Payer: Self-pay

## 2017-05-30 ENCOUNTER — Emergency Department (HOSPITAL_COMMUNITY): Payer: BLUE CROSS/BLUE SHIELD

## 2017-05-30 ENCOUNTER — Observation Stay (HOSPITAL_COMMUNITY)
Admission: EM | Admit: 2017-05-30 | Discharge: 2017-06-01 | Disposition: A | Discharge status: Home / Self Care | Payer: BLUE CROSS/BLUE SHIELD | Attending: Family Medicine | Admitting: Family Medicine

## 2017-05-30 DIAGNOSIS — I132 Hypertensive heart and chronic kidney disease with heart failure and with stage 5 chronic kidney disease, or end stage renal disease: Secondary | ICD-10-CM | POA: Diagnosis not present

## 2017-05-30 DIAGNOSIS — Z7982 Long term (current) use of aspirin: Secondary | ICD-10-CM | POA: Diagnosis not present

## 2017-05-30 DIAGNOSIS — Z79899 Other long term (current) drug therapy: Secondary | ICD-10-CM | POA: Diagnosis not present

## 2017-05-30 DIAGNOSIS — N390 Urinary tract infection, site not specified: Secondary | ICD-10-CM | POA: Diagnosis not present

## 2017-05-30 DIAGNOSIS — R1011 Right upper quadrant pain: Secondary | ICD-10-CM | POA: Diagnosis not present

## 2017-05-30 DIAGNOSIS — D638 Anemia in other chronic diseases classified elsewhere: Secondary | ICD-10-CM | POA: Diagnosis present

## 2017-05-30 DIAGNOSIS — I5032 Chronic diastolic (congestive) heart failure: Secondary | ICD-10-CM | POA: Diagnosis not present

## 2017-05-30 DIAGNOSIS — R112 Nausea with vomiting, unspecified: Secondary | ICD-10-CM | POA: Diagnosis present

## 2017-05-30 DIAGNOSIS — Z992 Dependence on renal dialysis: Secondary | ICD-10-CM | POA: Insufficient documentation

## 2017-05-30 DIAGNOSIS — I120 Hypertensive chronic kidney disease with stage 5 chronic kidney disease or end stage renal disease: Secondary | ICD-10-CM | POA: Diagnosis not present

## 2017-05-30 DIAGNOSIS — R197 Diarrhea, unspecified: Secondary | ICD-10-CM | POA: Diagnosis not present

## 2017-05-30 DIAGNOSIS — R509 Fever, unspecified: Secondary | ICD-10-CM | POA: Diagnosis not present

## 2017-05-30 DIAGNOSIS — N186 End stage renal disease: Secondary | ICD-10-CM | POA: Diagnosis not present

## 2017-05-30 DIAGNOSIS — E119 Type 2 diabetes mellitus without complications: Secondary | ICD-10-CM

## 2017-05-30 DIAGNOSIS — E1165 Type 2 diabetes mellitus with hyperglycemia: Secondary | ICD-10-CM | POA: Diagnosis not present

## 2017-05-30 DIAGNOSIS — Z794 Long term (current) use of insulin: Secondary | ICD-10-CM | POA: Diagnosis not present

## 2017-05-30 DIAGNOSIS — R1110 Vomiting, unspecified: Secondary | ICD-10-CM | POA: Diagnosis not present

## 2017-05-30 DIAGNOSIS — E1122 Type 2 diabetes mellitus with diabetic chronic kidney disease: Secondary | ICD-10-CM | POA: Diagnosis not present

## 2017-05-30 DIAGNOSIS — K529 Noninfective gastroenteritis and colitis, unspecified: Secondary | ICD-10-CM | POA: Diagnosis not present

## 2017-05-30 LAB — CBC WITH DIFFERENTIAL/PLATELET
BASOS ABS: 0 10*3/uL (ref 0.0–0.1)
Basophils Relative: 0 %
EOS ABS: 0.5 10*3/uL (ref 0.0–0.7)
Eosinophils Relative: 5 %
HCT: 34 % — ABNORMAL LOW (ref 36.0–46.0)
HEMOGLOBIN: 10.6 g/dL — AB (ref 12.0–15.0)
LYMPHS ABS: 0.7 10*3/uL (ref 0.7–4.0)
LYMPHS PCT: 7 %
MCH: 30.1 pg (ref 26.0–34.0)
MCHC: 31.2 g/dL (ref 30.0–36.0)
MCV: 96.6 fL (ref 78.0–100.0)
Monocytes Absolute: 0.9 10*3/uL (ref 0.1–1.0)
Monocytes Relative: 9 %
NEUTROS PCT: 79 %
Neutro Abs: 7.5 10*3/uL (ref 1.7–7.7)
Platelets: 135 10*3/uL — ABNORMAL LOW (ref 150–400)
RBC: 3.52 MIL/uL — AB (ref 3.87–5.11)
RDW: 13.2 % (ref 11.5–15.5)
WBC: 9.5 10*3/uL (ref 4.0–10.5)

## 2017-05-30 LAB — GLUCOSE, CAPILLARY
GLUCOSE-CAPILLARY: 112 mg/dL — AB (ref 65–99)
Glucose-Capillary: 155 mg/dL — ABNORMAL HIGH (ref 65–99)

## 2017-05-30 LAB — COMPREHENSIVE METABOLIC PANEL
ALBUMIN: 3 g/dL — AB (ref 3.5–5.0)
ALT: 69 U/L — ABNORMAL HIGH (ref 14–54)
ANION GAP: 13 (ref 5–15)
AST: 95 U/L — ABNORMAL HIGH (ref 15–41)
Alkaline Phosphatase: 187 U/L — ABNORMAL HIGH (ref 38–126)
BUN: 39 mg/dL — ABNORMAL HIGH (ref 6–20)
CO2: 25 mmol/L (ref 22–32)
Calcium: 8.3 mg/dL — ABNORMAL LOW (ref 8.9–10.3)
Chloride: 98 mmol/L — ABNORMAL LOW (ref 101–111)
Creatinine, Ser: 6.87 mg/dL — ABNORMAL HIGH (ref 0.44–1.00)
GFR calc Af Amer: 7 mL/min — ABNORMAL LOW (ref >60)
GFR calc non Af Amer: 6 mL/min — ABNORMAL LOW (ref >60)
GLUCOSE: 183 mg/dL — AB (ref 65–99)
POTASSIUM: 4.9 mmol/L (ref 3.5–5.1)
SODIUM: 136 mmol/L (ref 135–145)
TOTAL PROTEIN: 7 g/dL (ref 6.5–8.1)
Total Bilirubin: 1.4 mg/dL — ABNORMAL HIGH (ref 0.3–1.2)

## 2017-05-30 LAB — URINALYSIS, ROUTINE W REFLEX MICROSCOPIC
Bilirubin Urine: NEGATIVE
GLUCOSE, UA: NEGATIVE mg/dL
Ketones, ur: 5 mg/dL — AB
Nitrite: NEGATIVE
PH: 5 (ref 5.0–8.0)
Protein, ur: >=300 mg/dL — AB
SPECIFIC GRAVITY, URINE: 1.018 (ref 1.005–1.030)

## 2017-05-30 LAB — CBG MONITORING, ED
Glucose-Capillary: 178 mg/dL — ABNORMAL HIGH (ref 65–99)
Glucose-Capillary: 112 mg/dL — ABNORMAL HIGH (ref 65–99)

## 2017-05-30 LAB — LIPASE, BLOOD: Lipase: 34 U/L (ref 11–51)

## 2017-05-30 LAB — TROPONIN I: TROPONIN I: 0.04 ng/mL — AB (ref <0.03)

## 2017-05-30 LAB — MRSA PCR SCREENING: MRSA by PCR: NEGATIVE

## 2017-05-30 MED ORDER — SODIUM CHLORIDE 0.9 % IV BOLUS (SEPSIS)
500.0000 mL | Freq: Once | INTRAVENOUS | Status: AC
  Administered 2017-05-30: 500 mL via INTRAVENOUS

## 2017-05-30 MED ORDER — AMLODIPINE BESYLATE 5 MG PO TABS
ORAL_TABLET | ORAL | Status: AC
  Administered 2017-05-30: 17:00:00 via ORAL
  Filled 2017-05-30: qty 2

## 2017-05-30 MED ORDER — CLONIDINE HCL 0.1 MG PO TABS
0.1000 mg | ORAL_TABLET | Freq: Two times a day (BID) | ORAL | Status: DC
  Administered 2017-05-30 – 2017-06-01 (×4): 0.1 mg via ORAL
  Filled 2017-05-30 (×4): qty 1

## 2017-05-30 MED ORDER — ASPIRIN 81 MG PO CHEW
81.0000 mg | CHEWABLE_TABLET | Freq: Every day | ORAL | Status: DC
  Administered 2017-05-30 – 2017-06-01 (×2): 81 mg via ORAL
  Filled 2017-05-30 (×2): qty 1

## 2017-05-30 MED ORDER — PREDNISONE 10 MG PO TABS
5.0000 mg | ORAL_TABLET | Freq: Every day | ORAL | Status: DC
  Administered 2017-05-31 – 2017-06-01 (×2): 5 mg via ORAL
  Filled 2017-05-30 (×2): qty 1

## 2017-05-30 MED ORDER — ROSUVASTATIN CALCIUM 10 MG PO TABS
10.0000 mg | ORAL_TABLET | Freq: Every day | ORAL | Status: DC
  Administered 2017-05-30 – 2017-05-31 (×2): 10 mg via ORAL
  Filled 2017-05-30 (×3): qty 1

## 2017-05-30 MED ORDER — IOPAMIDOL (ISOVUE-300) INJECTION 61%
100.0000 mL | Freq: Once | INTRAVENOUS | Status: AC | PRN
  Administered 2017-05-30: 100 mL via INTRAVENOUS

## 2017-05-30 MED ORDER — PANTOPRAZOLE SODIUM 40 MG IV SOLR
40.00 | INTRAVENOUS | Status: DC
Start: 2017-05-30 — End: 2017-05-30

## 2017-05-30 MED ORDER — CALCIUM GLUCONATE 500 MG PO TABS
1.0000 | ORAL_TABLET | Freq: Three times a day (TID) | ORAL | Status: DC
  Filled 2017-05-30 (×5): qty 1

## 2017-05-30 MED ORDER — ISOSORBIDE MONONITRATE ER 60 MG PO TB24
60.0000 mg | ORAL_TABLET | Freq: Every day | ORAL | Status: DC
  Administered 2017-05-30 – 2017-06-01 (×2): 60 mg via ORAL
  Filled 2017-05-30 (×4): qty 1

## 2017-05-30 MED ORDER — INSULIN ASPART 100 UNIT/ML ~~LOC~~ SOLN
0.0000 [IU] | Freq: Three times a day (TID) | SUBCUTANEOUS | Status: DC
  Administered 2017-05-31 (×2): 2 [IU] via SUBCUTANEOUS

## 2017-05-30 MED ORDER — TACROLIMUS 0.5 MG PO CAPS
0.5000 mg | ORAL_CAPSULE | ORAL | Status: DC
Start: 2017-05-30 — End: 2017-05-30

## 2017-05-30 MED ORDER — CEFTRIAXONE SODIUM 1 G IJ SOLR
1.0000 g | Freq: Once | INTRAMUSCULAR | Status: AC
  Administered 2017-05-30: 1 g via INTRAVENOUS
  Filled 2017-05-30: qty 10

## 2017-05-30 MED ORDER — ROSUVASTATIN CALCIUM 20 MG PO TABS: 20.0000 mg | ORAL_TABLET | Freq: Every day | ORAL | Status: DC

## 2017-05-30 MED ORDER — SODIUM CHLORIDE 0.9 % IV SOLN
1000.0000 mL | INTRAVENOUS | Status: DC
  Administered 2017-05-30: 1000 mL via INTRAVENOUS

## 2017-05-30 MED ORDER — ACETAMINOPHEN 500 MG PO TABS: 500.0000 mg | ORAL_TABLET | Freq: Four times a day (QID) | ORAL | Status: DC | PRN

## 2017-05-30 MED ORDER — SODIUM CHLORIDE 0.9 % IV SOLN
1.0000 g | INTRAVENOUS | Status: DC
  Administered 2017-05-31 – 2017-06-01 (×2): 1 g via INTRAVENOUS
  Filled 2017-05-30 (×2): qty 10
  Filled 2017-05-30: qty 1
  Filled 2017-05-30: qty 10
  Filled 2017-05-30: qty 1

## 2017-05-30 MED ORDER — CALCIUM GLUCONATE 500 MG PO TABS
1.0000 | ORAL_TABLET | Freq: Three times a day (TID) | ORAL | Status: DC
  Filled 2017-05-30 (×6): qty 1

## 2017-05-30 MED ORDER — TACROLIMUS 0.5 MG PO CAPS
0.5000 mg | ORAL_CAPSULE | Freq: Every day | ORAL | Status: DC
  Administered 2017-05-30 – 2017-05-31 (×2): 0.5 mg via ORAL
  Filled 2017-05-30 (×4): qty 1

## 2017-05-30 MED ORDER — SEVELAMER CARBONATE 800 MG PO TABS
800.0000 mg | ORAL_TABLET | Freq: Three times a day (TID) | ORAL | Status: DC
Start: 2017-05-30 — End: 2017-06-01
  Administered 2017-05-30 – 2017-06-01 (×5): 800 mg via ORAL
  Filled 2017-05-30 (×11): qty 1

## 2017-05-30 MED ORDER — METOPROLOL TARTRATE 25 MG PO TABS
12.5000 mg | ORAL_TABLET | Freq: Two times a day (BID) | ORAL | Status: DC
  Administered 2017-05-30 – 2017-06-01 (×4): 12.5 mg via ORAL
  Filled 2017-05-30 (×4): qty 1

## 2017-05-30 MED ORDER — ONDANSETRON HCL 4 MG/2ML IJ SOLN
4.0000 mg | Freq: Once | INTRAMUSCULAR | Status: AC
  Administered 2017-05-30: 4 mg via INTRAVENOUS
  Filled 2017-05-30: qty 2

## 2017-05-30 MED ORDER — TACROLIMUS 1 MG PO CAPS
1.0000 mg | ORAL_CAPSULE | Freq: Every day | ORAL | Status: DC
  Administered 2017-05-30 – 2017-06-01 (×2): 1 mg via ORAL
  Filled 2017-05-30 (×4): qty 1

## 2017-05-30 MED ORDER — AMLODIPINE BESYLATE 5 MG PO TABS
10.0000 mg | ORAL_TABLET | Freq: Every day | ORAL | Status: DC
  Administered 2017-05-30 – 2017-06-01 (×2): 10 mg via ORAL
  Filled 2017-05-30: qty 2

## 2017-05-30 NOTE — H&P (Signed)
[facility] History and Physical  [name] YQI:[id number] DOB: Aug 20, 1955 DOA: 05/30/2017  Referring physician:  PCP: Sharion Balloon, FNP  Specialists:   Chief Complaint: nausea, vomiting   HPI: [name] is a 62 y.o. female with PMH of HTN, DM, ESRD on HD (MWF), Anemia, presented with nausea, vomiting. Patient states that she has been feeling sick for 5 days. She reports eating a [place] food on Thursday. Initially, she developed 2-3 episodes of diarrhea with nausea and vomiting. No there diarrhea reported but nausea and vomiting persisted. Denies hematemesis, no blood in stool. She reports mild right upper abdominal discomfort no significant pains. She went to [facility] and diagnosed with urinary tract infection. She was discharged home on keflex. She presented today due to recurrent episodes of vomiting. She also reports tick bite on Sunday. No rash. No joint pain. No meningeal signs. She denies acute chest pains, no cough.  -ED: ct abdomen: no acute findings. UA still positive for UTI. Started on iv ceftriaxone. Hospitalist is called for admission    Review of Systems: The patient denies anorexia, fever, weight loss,, vision loss, decreased hearing, hoarseness, chest pain, syncope, dyspnea on exertion, peripheral edema, balance deficits, hemoptysis, abdominal pain, melena, hematochezia, severe indigestion/heartburn, hematuria, incontinence, genital sores, muscle weakness, suspicious skin lesions, transient blindness, difficulty walking, depression, unusual weight change, abnormal bleeding, enlarged lymph nodes, angioedema, and breast masses.    Past Medical History:  Diagnosis Date  . Anemia    of chronic disease  . Blood transfusion without reported diagnosis   . Cardiovascular disease    nonobstructive  . Carotid artery stenosis 2008  . CHF (congestive heart failure) ([place])   . Coronary artery disease   . Diabetes mellitus   . Dyslipidemia   .  Edema of lower extremity    with hypo-albuminemia and profound protenuria  . Heart murmur   . Hypertension   . Mitral regurgitation   . Nephrotic syndrome   . Patent foramen ovale   . Pulmonary hypertension, moderate to severe ([place])   . Pulmonary nodule   . Tricuspid regurgitation   . Volume depletion, renal, due to output loss (renal deficit)    Past Surgical History:  Procedure Laterality Date  . A/V FISTULAGRAM N/A 04/05/2017   Procedure: A/V FISTULAGRAM - Left Arm;  Surgeon: Angelia Mould, MD;  Location: [place] [facility];  Service: Cardiovascular;  Laterality: N/A;  . A/V SHUNT INTERVENTION Left 04/05/2017   Procedure: A/V SHUNT INTERVENTION;  Surgeon: Angelia Mould, MD;  Location: [place] [facility];  Service: Cardiovascular;  Laterality: Left;  . CARDIAC CATHETERIZATION  2008  . IR REMOVAL TUN CV CATH W/O FL  11/02/2016  . KIDNEY TRANSPLANT Right 02/23/2009   Social History:  reports that she has never smoked. She has never used smokeless tobacco. She reports that she does not drink alcohol or use drugs. Home;  where does patient live--home, ALF, SNF? and with whom if at home? Yesl;  Can patient participate in ADLs?  Allergies  Allergen Reactions  . Other Other (See Comments)    Bruises and tears skin, Paper tape tolerated    Family History  Problem Relation Age of Onset  . Kidney disease Mother   . Diabetes Mother   . Diabetes Father   . Heart disease Father     (be sure to complete)  Prior to Admission medications   Medication Sig Start Date End Date Taking? Authorizing Provider  acetaminophen (  TYLENOL) 500 MG tablet Take 500 mg by mouth every 6 (six) hours as needed for moderate pain or headache.  04/06/16  Yes [provider]  amLODipine (NORVASC) 10 MG tablet TAKE 10 MG BY MOUTH NIGHTLY 10/24/16  Yes [provider]  aspirin (ASPIR-LOW) 81 MG EC tablet Take 81 mg by mouth daily.    Yes [provider]  calcium gluconate  500 MG tablet Take 1 tablet by mouth 3 (three) times daily.   Yes [provider]  cephALEXin (KEFLEX) 500 MG capsule Take 1 capsule by mouth 2 (two) times daily. 05/29/17 06/08/17 Yes [provider]  cloNIDine (CATAPRES) 0.1 MG tablet Take 0.1 mg by mouth 2 (two) times daily.   Yes [provider]  Dulaglutide (TRULICITY) 1.5 FO/2.7XA SOPN Inject 1.5 mg into the skin once a week. Patient taking differently: Inject 1.5 mg into the skin once a week. On Saturdays 02/23/17  Yes Hawks, Christy A, FNP  insulin degludec (TRESIBA FLEXTOUCH) 100 UNIT/ML SOPN FlexTouch Pen Inject 0.13 mLs (13 Units total) into the skin daily at 10 pm. 02/23/17  Yes Hawks, Christy A, FNP  insulin glargine (LANTUS) 100 UNIT/ML injection Inject 10-15 Units into the skin at bedtime. 10 units in the morning, 15 units in the evening   Yes [provider]  isosorbide mononitrate (IMDUR) 60 MG 24 hr tablet Take 1 tablet (60 mg total) by mouth daily. 04/03/17  Yes Hawks, Christy A, FNP  lidocaine-prilocaine (EMLA) cream Apply 1 application topically as needed (for dialysis).  09/08/16  Yes [provider]  metoprolol tartrate (LOPRESSOR) 25 MG tablet Take 12.5 mg by mouth 2 (two) times daily.  10/23/16  Yes [provider]  ondansetron (ZOFRAN-ODT) 4 MG disintegrating tablet Take 1 tablet (4 mg total) by mouth every 8 (eight) hours as needed for nausea or vomiting. 04/03/17  Yes Hawks, Christy A, FNP  predniSONE (DELTASONE) 5 MG tablet Take 1 tablet (5 mg total) by mouth daily with breakfast. 04/03/17  Yes Hawks, Christy A, FNP  PROGRAF 0.5 MG capsule Take 0.5-1 mg by mouth See admin instructions. Take 1 mg by mouth in the morning and take 0.5 mg by mouth at bedtime 10/24/16  Yes [provider]  rosuvastatin (CRESTOR) 20 MG tablet Take 1 tablet (20 mg total) by mouth daily. 04/03/17  Yes Hawks, Alyse Low A, FNP  sevelamer carbonate (RENVELA) 800 MG tablet Take 3200 mg by mouth 3 times  daily with meals and take 1600 mg by mouth with snacks 10/02/16  Yes [provider]   Physical Exam: Vitals:   05/30/17 1327 05/30/17 1330  BP: (!) 114/44 (!) 147/54  Pulse: 61 62  Resp: 18 14  Temp:    SpO2: 99% 97%     General:  No distress   Eyes: eom-i  ENT: no oral ulcers   Neck: supple  Cardiovascular: s1,s2 rrr  Respiratory: CTA BL  Abdomen: soft mild ruq tender, no rebound   Skin: no rash. Mild erythema at the tick bite skin area  Musculoskeletal: no leg edema   Psychiatric: no hallucinations   Neurologic: CN 2-12 intact. Motor 5/5 BL  Labs on Admission:  Basic Metabolic Panel: Recent Labs  Lab 05/24/17 1233 05/30/17 0930  NA 137 136  K 5.3* 4.9  CL 96 98*  CO2 24 25  GLUCOSE 198* 183*  BUN 30* 39*  CREATININE 5.54* 6.87*  CALCIUM 9.2 8.3*   Liver Function Tests: Recent Labs  Lab 05/24/17 1233 05/30/17  0930  AST 26 95*  ALT 30 69*  ALKPHOS 190* 187*  BILITOT 0.5 1.4*  PROT 7.1 7.0  ALBUMIN 3.7 3.0*   Recent Labs  Lab 05/30/17 0930  LIPASE 34   No results for input(s): AMMONIA in the last 168 hours. CBC: Recent Labs  Lab 05/24/17 1233 05/30/17 0930  WBC 8.4 9.5  NEUTROABS 6.1 7.5  HGB 12.1 10.6*  HCT 36.9 34.0*  MCV 93 96.6  PLT 194 135*   Cardiac Enzymes: Recent Labs  Lab 05/30/17 0930  TROPONINI 0.04*    BNP (last 3 results) No results for input(s): BNP in the last 8760 hours.  ProBNP (last 3 results) No results for input(s): PROBNP in the last 8760 hours.  CBG: Recent Labs  Lab 05/30/17 0923  GLUCAP 178*    Radiological Exams on Admission: Ct Abdomen Pelvis W Contrast  Result Date: 05/30/2017 CLINICAL DATA:  62 year old female with vomiting and diarrhea for the past 5 days with fever. Urinary tract infection. EXAM: CT ABDOMEN AND PELVIS WITH CONTRAST TECHNIQUE: Multidetector CT imaging of the abdomen and pelvis was performed using the standard protocol following bolus administration of  intravenous contrast. CONTRAST:  139mL ISOVUE-300 IOPAMIDOL (ISOVUE-300) INJECTION 61% COMPARISON:  CT the abdomen and pelvis 06/28/2015. FINDINGS: Lower chest: Patchy multifocal areas of architectural distortion in the periphery of the lung bases, similar to the prior examination from 06/28/2015, likely to reflect sequela of chronic aspiration or prior infections. Atherosclerotic calcifications in the descending thoracic aorta and in the right coronary artery. Hepatobiliary: No suspicious cystic or solid hepatic lesions. No intra or extrahepatic biliary ductal dilatation. Status post cholecystectomy. Pancreas: No pancreatic mass. No pancreatic ductal dilatation. No pancreatic or peripancreatic fluid or inflammatory changes. Spleen: Unremarkable. Adrenals/Urinary Tract: Native kidneys are atrophic bilaterally. No suspicious renal lesions. Bilateral adrenal glands are normal in appearance. No hydroureteronephrosis associated with either native kidney or ureter. Urinary bladder is unremarkable in appearance. Renal transplant in the right iliac fossa is grossly normal in appearance. No hydroureteronephrosis associated with the transplant kidney. No abnormal perinephric fluid collection adjacent to the transplant kidney. Stomach/Bowel: Normal appearance of the stomach. No pathologic dilatation of small bowel or colon. The appendix is not confidently identified and may be surgically absent. Regardless, there are no inflammatory changes noted adjacent to the cecum to suggest the presence of an acute appendicitis at this time. Vascular/Lymphatic: Aortic atherosclerosis without evidence of aneurysm or dissection in the abdominal or pelvic vasculature. No lymphadenopathy noted in the abdomen or pelvis. Reproductive: Uterus and ovaries are atrophic. Other: No significant volume of ascites.  No pneumoperitoneum. Musculoskeletal: There are no aggressive appearing lytic or blastic lesions noted in the visualized portions of the  skeleton. New but chronic appearing compression fracture of inferior endplate of W80 with 32% loss of anterior vertebral body height. IMPRESSION: 1. No acute findings are noted in the abdomen or pelvis. 2. Right-sided transplant kidney in the iliac fossa is grossly normal in appearance. 3. Aortic atherosclerosis, in addition to at least right coronary artery disease. Please note that although the presence of coronary artery calcium documents the presence of coronary artery disease, the severity of this disease and any potential stenosis cannot be assessed on this non-gated CT examination. Assessment for potential risk factor modification, dietary therapy or pharmacologic therapy may be warranted, if clinically indicated. 4. Additional incidental findings, as above. Aortic Atherosclerosis (ICD10-I70.0). Electronically Signed   By: Vinnie Langton M.D.   On: 05/30/2017 12:00   Dg Chest Portable  1 View  Result Date: 05/30/2017 CLINICAL DATA:  Fever for 5 days associated with vomiting and nausea. History of ste coronary artery disease, CHF, and hypertension. Also history of renal transplantation. EXAM: PORTABLE CHEST 1 VIEW COMPARISON:  Chest x-ray of April 24, 2017 FINDINGS: The right hemidiaphragm remains mildly elevated. The interstitial markings of both lungs are increased. The pulmonary vascularity is more engorged. The heart is mildly enlarged. There is a trace left pleural effusion. There is calcification in the wall of the aortic arch. IMPRESSION: Findings compatible with mild CHF which has appeared since the previous study. No alveolar pneumonia. Thoracic aortic atherosclerosis. Electronically Signed   By: David  Martinique M.D.   On: 05/30/2017 09:29    EKG: Independently reviewed.   Assessment/Plan Active Problems:   Chronic diastolic heart failure (HCC)   Diabetes mellitus (HCC)   Anemia of chronic disease   Nausea & vomiting   62 y.o. female with PMH of HTN, DM, ESRD on HD (MWF), Anemia,  presented with nausea, vomiting.   Nausea, vomiting episode of diarrhea (resolved). Unclear etiology. Questionable gastroenteritis, developed after eating [place] food vs symptoms in the setting of UTI. Ct abd: No acute findings are noted in the abdomen or pelvis. Mild elevation in LFTs, lipase-34 -we will cont supportive care, antiemetics, treat UTI,  monitor.   UTI. Cont iv ceftriaxone, f/u cultures  ESRD on HD (MWF). H/o transplant/rejection/failure. -No hypoxia, mild congestion. Will consult nephrology for HD  DM. Monitor on ISS while NPO. Resume lantus tomorrow when able to eat   Recent tick bite. Afebrile. No erythema or there rash. Will monitor for s/s of tickborne illness.   Nephrology;  if consultant consulted, please document name and whether formally or informally consulted  Code Status: full (must indicate code status--if unknown or must be presumed, indicate so) Family Communication: d/w patient, her family, ED (indicate person spoken with, if applicable, with phone number if by telephone) Disposition Plan: home 24-48 hrs  (indicate anticipated LOS)  Time spent: >35 minutes   Kinnie Feil [facility] Pager [phone]  If 7PM-7AM, please contact night-coverage [email or web address] Password [email or web address] 05/30/2017, 1:50 PM

## 2017-05-30 NOTE — ED Notes (Signed)
Attempted to obtain lab with IV start, two out of three tubes obtained. Phlebotomist aware and at bedside obtaining remaining sample.

## 2017-05-30 NOTE — ED Notes (Signed)
Date and time results received: 05/30/17 1020   Test: Troponin Critical Value: 0.04  Name of Provider Notified: Lily Kocher  Orders Received? Or Actions Taken?: No new orders given.

## 2017-05-30 NOTE — ED Triage Notes (Signed)
PT states continued nausea with vomiting with intermittent fever x5 days. PT denies any pain at this time. PT states she went to [facility] ED yesterday and was treated and discharged. PT stated she tried to go to dialysis today but was told to come to the ED instead.

## 2017-05-30 NOTE — ED Provider Notes (Signed)
[facility] EMERGENCY DEPARTMENT Provider Note   CSN: [id number] Arrival date & time: 05/30/17  0815     History   Chief Complaint Chief Complaint  Patient presents with  . Emesis    HPI MARKISHA MEDING is a 62 y.o. female.  Patient is a 62 year old female who presents to the emergency department with a complaint of vomiting and weakness.  The patient states she has been sick over the past 5 days.  She is primarily been noticing nausea vomiting.  She has had a temperature elevation with a temperature max of 101.6.  The patient states that she has had a bout of diarrhea about 3 or 4 days ago, but no other diarrhea reported.  She denies any blood in the vomitus, denies any blood in the diarrhea. Pt states she sustained a tick bite. Tick removed Sunday 3/24. It is of note that she is a dialysis patient.  She has dialysis on Monday Wednesday and Friday.  She presented for dialysis this morning, and was sent to the emergency department because they felt that she was too sick.  It is also of note that the patient was seen in the emergency department at the [facility].  At that time it was thought that she had a viral illness with fever.  She states she had a battery of test, and none of them showed any significant problem.  The patient had vomiting on last night and then again this morning.  She states that she is not been around anyone that she knows of that is sick, however she does have dialysis on Monday Wednesday and Friday and said that someone there may have been sick.  She states that she can get some liquids down, but has been having the problems with the vomiting.  She has not been out of the country recently.  She has not had any significant change in her diet or in her medications.  She presents now for assistance with this issue.     Past Medical History:  Diagnosis Date  . Anemia    of chronic disease  . Blood transfusion without reported diagnosis   .  Cardiovascular disease    nonobstructive  . Carotid artery stenosis 2008  . CHF (congestive heart failure) ([place])   . Coronary artery disease   . Diabetes mellitus   . Dyslipidemia   . Edema of lower extremity    with hypo-albuminemia and profound protenuria  . Heart murmur   . Hypertension   . Mitral regurgitation   . Nephrotic syndrome   . Patent foramen ovale   . Pulmonary hypertension, moderate to severe ([place])   . Pulmonary nodule   . Tricuspid regurgitation   . Volume depletion, renal, due to output loss (renal deficit)     Patient Active Problem List   Diagnosis Date Noted  . Hyperlipidemia associated with type 2 diabetes mellitus ([place]) 02/22/2017  . CKD (chronic kidney disease) requiring chronic dialysis ([place]) 08-29-2016  . 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 Encounter for aftercare following kidney transplant 05/22/2016  . Chronic diastolic heart failure ([place]) 06/08/2015  . Aortic valve sclerosis 06/08/2015  . CKD (chronic kidney disease) stage 4, GFR 15-29 ml/min (H[facility]) 01/01/2015  . Anemia of chronic disease 07/03/2013  . Hypertension associated with diabetes ([place]) 12/18/2010  . Diabetes mellitus ([place]) 12/18/2010    Past Surgical History:  Procedure Laterality Date  . A/V FISTULAGRAM N/A 04/05/2017   Procedure: A/V FISTULAGRAM -  Left Arm;  Surgeon: Angelia Mould, MD;  Location: [place] [facility];  Service: Cardiovascular;  Laterality: N/A;  . A/V SHUNT INTERVENTION Left 04/05/2017   Procedure: A/V SHUNT INTERVENTION;  Surgeon: Angelia Mould, MD;  Location: [place] [facility];  Service: Cardiovascular;  Laterality: Left;  . CARDIAC CATHETERIZATION  2008  . IR REMOVAL TUN CV CATH W/O FL  11/02/2016  . KIDNEY TRANSPLANT Right 02/23/2009     OB History   None      Home Medications    Prior to Admission medications   Medication Sig Start Date End Date Taking? Authorizing Provider  acetaminophen (TYLENOL) 500 MG tablet Take  500 mg by mouth every 6 (six) hours as needed for moderate pain or headache.  04/06/16   [provider]  amLODipine (NORVASC) 10 MG tablet TAKE 10 MG BY MOUTH NIGHTLY 10/24/16   [provider]  aspirin (ASPIR-LOW) 81 MG EC tablet Take 81 mg by mouth daily.     [provider]  Dulaglutide (TRULICITY) 1.5 IW/5.8KD SOPN Inject 1.5 mg into the skin once a week. Patient taking differently: Inject 1.5 mg into the skin once a week. On Saturdays 02/23/17   Evelina Dun A, FNP  insulin degludec (TRESIBA FLEXTOUCH) 100 UNIT/ML SOPN FlexTouch Pen Inject 0.13 mLs (13 Units total) into the skin daily at 10 pm. 02/23/17   Sharion Balloon, FNP  isosorbide mononitrate (IMDUR) 60 MG 24 hr tablet Take 1 tablet (60 mg total) by mouth daily. 04/03/17   Sharion Balloon, FNP  lidocaine-prilocaine (EMLA) cream Apply 1 application topically as needed (for dialysis).  09/08/16   [provider]  metoprolol tartrate (LOPRESSOR) 25 MG tablet Take 12.5 mg by mouth 2 (two) times daily.  10/23/16   [provider]  ondansetron (ZOFRAN-ODT) 4 MG disintegrating tablet Take 1 tablet (4 mg total) by mouth every 8 (eight) hours as needed for nausea or vomiting. 04/03/17   Sharion Balloon, FNP  predniSONE (DELTASONE) 5 MG tablet Take 1 tablet (5 mg total) by mouth daily with breakfast. 04/03/17   Evelina Dun A, FNP  PROGRAF 0.5 MG capsule Take 0.5-1 mg by mouth See admin instructions. Take 1 mg by mouth in the morning and take 0.5 mg by mouth at bedtime 10/24/16   [provider]  rosuvastatin (CRESTOR) 20 MG tablet Take 1 tablet (20 mg total) by mouth daily. 04/03/17   Sharion Balloon, FNP  sevelamer carbonate (RENVELA) 800 MG tablet Take 3200 mg by mouth 3 times daily with meals and take 1600 mg by mouth with snacks 10/02/16   [provider]    Family History Family History  Problem Relation Age of Onset  . Kidney disease Mother   . Diabetes Mother   . Diabetes  Father   . Heart disease Father     Social History Social History   Tobacco Use  . Smoking status: Never Smoker  . Smokeless tobacco: Never Used  Substance Use Topics  . Alcohol use: No  . Drug use: No     Allergies   Other   Review of Systems Review of Systems  Constitutional: Positive for activity change, appetite change, fatigue and fever.  Gastrointestinal: Positive for diarrhea, nausea and vomiting.  Musculoskeletal: Positive for myalgias.  Neurological: Positive for headaches.     Physical Exam Updated Vital Signs BP (!) 141/48 (BP Location: Right Arm) Comment: Simultaneous filing. User may not have seen previous data.  Pulse 66  Comment: Simultaneous filing. User may not have seen previous data.  Temp 98.9 F (37.2 C) (Oral)   Resp 13   Ht 5\' 9"  (1.753 m)   Wt 62.6 kg (138 lb)   SpO2 96% Comment: Simultaneous filing. User may not have seen previous data.  BMI 20.38 kg/m   Physical Exam  Constitutional: She is oriented to person, place, and time. She appears well-developed.  Non-toxic appearance. She appears ill.  HENT:  Head: Normocephalic.  Right Ear: Tympanic membrane and external ear normal.  Left Ear: Tympanic membrane and external ear normal.  Mucous membranes dry.  Eyes: Pupils are equal, round, and reactive to light. EOM and lids are normal.  Neck: Normal range of motion. Neck supple. Carotid bruit is not present.  Cardiovascular: Normal rate, intact distal pulses and normal pulses. Exam reveals friction rub. Exam reveals no gallop.  Murmur heard. Occasional skipped beat noted.  No rub or gallop appreciated.  Pulmonary/Chest: Breath sounds normal. No respiratory distress.  Few scattered rhonchi at the bases, symmetrical rise and fall of the chest.  The patient speaks in complete sentences without problem.  Abdominal: Soft. Bowel sounds are normal. There is tenderness in the right upper quadrant. There is no guarding.  Musculoskeletal: Normal range  of motion. She exhibits no edema or tenderness.  Dialysis catheter left upper extremity with good thrill.  Lymphadenopathy:       Head (right side): No submandibular adenopathy present.       Head (left side): No submandibular adenopathy present.    She has no cervical adenopathy.  Neurological: She is alert and oriented to person, place, and time. She has normal strength. No cranial nerve deficit or sensory deficit.  Skin: Skin is warm and dry.  Psychiatric: She has a normal mood and affect. Her speech is normal.  Nursing note and vitals reviewed.    ED Treatments / Results  Labs (all labs ordered are listed, but only abnormal results are displayed) Labs Reviewed  COMPREHENSIVE METABOLIC PANEL  LIPASE, BLOOD  TROPONIN I  CBC WITH DIFFERENTIAL/PLATELET  URINALYSIS, ROUTINE W REFLEX MICROSCOPIC  CBG MONITORING, ED    EKG None  EKG Interpretation  Date/Time:  Wednesday May 30 2017 08:56:53 EDT Ventricular Rate:  66 PR Interval:    QRS Duration: 103 QT Interval:  464 QTC Calculation: 487 R Axis:   -55 Text Interpretation:  Sinus rhythm Atrial premature complexes Left anterior fascicular block Abnormal R-wave progression, late transition Borderline prolonged QT interval Baseline wander in lead(s) V3 V5 V6 No significant change since last tracing Confirmed by Orlie Dakin [phone]) on 05/30/2017 9:09:12 AM      Radiology No results found.  Procedures Procedures (including critical care time)  Medications Ordered in ED Medications  sodium chloride 0.9 % bolus 500 mL (has no administration in time range)    Followed by  0.9 %  sodium chloride infusion (has no administration in time range)     Initial Impression / Assessment and Plan / ED Course  I have reviewed the triage vital signs and the nursing notes.  Pertinent labs & imaging results that were available during my care of the patient were reviewed by me and considered in my medical decision making (see chart  for details).       Final Clinical Impressions(s) / ED Diagnoses Patient seen with me by Dr. Cathleen Fears.  Diabetic renal patient on prograf with possible gastroenteritis. UTI diagnosed yesterday. Pt started on bactrim.  CBG-178  Urine analysis shows  a amber cloudy specimen with greater than 300 mg/dL of protein.  There is a large leukocyte esterase.  There is too many to count white blood cells, budding yeast, as well as hyaline casts are present.  A culture will be sent to the lab. Chest x-ray shows mild CHF.  Troponin 0.04. No previous troponin level.  CT scan is negative for acute abdomen or acute findings of the abdomen and pelvis.  There is noted some aortic atherosclerosis in addition to right coronary artery disease.  Case discussed with Dr. Cathleen Fears.  Call placed to [facility] hospitalist for admission.   Final diagnoses:  None    ED Discharge Orders    None       Lily Kocher, PA-C 05/30/17 1235    Orlie Dakin, MD 05/30/17 1441

## 2017-05-30 NOTE — ED Notes (Signed)
Patient transported to CT 

## 2017-05-30 NOTE — ED Provider Notes (Signed)
Complains of vomiting and diarrhea onset 5 days ago.  No diarrhea since 05/26/2017.  She vomited 3 or 4 times since last night.  She denies nausea at present.  Denies pain anywhere.  Other associated symptoms include temperature 101.6 most recent fever was yesterday.  She went to dialysis this morning.  She was sent directly here for further evaluation and did not receive dialysis.  She denies chest pain denies abdominal pain.  Seen at [facility] yesterday diagnosed with UTI.  Started on Bactrim she thinks she is vomited her medicine.   Orlie Dakin, MD 05/30/17 [phone]

## 2017-05-30 NOTE — Progress Notes (Signed)
Pharmacy Antibiotic Note  [name] is a 62 y.o. female admitted on 05/30/2017 with UTI.  Pharmacy has been consulted for ceftriaxone dosing.  Plan: Ceftriaxone 1000 mg IV Q24 hours  Monitor c/s and patient improvement  Height: 5\' 9"  (175.3 cm) Weight: 138 lb (62.6 kg) IBW/kg (Calculated) : 66.2  Temp (24hrs), Avg:98.9 F (37.2 C), Min:98.9 F (37.2 C), Max:98.9 F (37.2 C)  Recent Labs  Lab 05/24/17 1233 05/30/17 0930  WBC 8.4 9.5  CREATININE 5.54* 6.87*    Estimated Creatinine Clearance: 8.4 mL/min (A) (by C-G formula based on SCr of 6.87 mg/dL (H)).    Allergies  Allergen Reactions  . Other Other (See Comments)    Bruises and tears skin, Paper tape tolerated    Antimicrobials this admission: ceftriaxone 3/27 >>    Dose adjustments this admission: N/A  Microbiology results:  3/27 UCx: pending   Thank you for allowing pharmacy to be a part of this patient's care. Pharmacy will sign off on this consult due to no need for renal adjustments.  [name] 05/30/2017 2:28 PM

## 2017-05-31 DIAGNOSIS — N390 Urinary tract infection, site not specified: Secondary | ICD-10-CM | POA: Diagnosis not present

## 2017-05-31 DIAGNOSIS — E1165 Type 2 diabetes mellitus with hyperglycemia: Secondary | ICD-10-CM | POA: Diagnosis not present

## 2017-05-31 DIAGNOSIS — R112 Nausea with vomiting, unspecified: Secondary | ICD-10-CM

## 2017-05-31 DIAGNOSIS — N186 End stage renal disease: Secondary | ICD-10-CM | POA: Diagnosis not present

## 2017-05-31 DIAGNOSIS — I5032 Chronic diastolic (congestive) heart failure: Secondary | ICD-10-CM | POA: Diagnosis not present

## 2017-05-31 DIAGNOSIS — D638 Anemia in other chronic diseases classified elsewhere: Secondary | ICD-10-CM

## 2017-05-31 DIAGNOSIS — Z794 Long term (current) use of insulin: Secondary | ICD-10-CM | POA: Diagnosis not present

## 2017-05-31 DIAGNOSIS — Z992 Dependence on renal dialysis: Secondary | ICD-10-CM | POA: Diagnosis not present

## 2017-05-31 LAB — CBC
HCT: 29.1 % — ABNORMAL LOW (ref 36.0–46.0)
Hemoglobin: 9.2 g/dL — ABNORMAL LOW (ref 12.0–15.0)
MCH: 30.3 pg (ref 26.0–34.0)
MCHC: 31.6 g/dL (ref 30.0–36.0)
MCV: 95.7 fL (ref 78.0–100.0)
Platelets: 127 K/uL — ABNORMAL LOW (ref 150–400)
RBC: 3.04 MIL/uL — ABNORMAL LOW (ref 3.87–5.11)
RDW: 13 % (ref 11.5–15.5)
WBC: 7.9 K/uL (ref 4.0–10.5)

## 2017-05-31 LAB — URINE CULTURE: Culture: <10000 — AB

## 2017-05-31 LAB — GLUCOSE, CAPILLARY
GLUCOSE-CAPILLARY: 115 mg/dL — AB (ref 65–99)
GLUCOSE-CAPILLARY: 180 mg/dL — AB (ref 65–99)
GLUCOSE-CAPILLARY: 174 mg/dL — AB (ref 65–99)
Glucose-Capillary: 176 mg/dL — ABNORMAL HIGH (ref 65–99)

## 2017-05-31 LAB — BASIC METABOLIC PANEL WITH GFR
Anion gap: 13 (ref 5–15)
BUN: 45 mg/dL — ABNORMAL HIGH (ref 6–20)
CO2: 25 mmol/L (ref 22–32)
Calcium: 8.1 mg/dL — ABNORMAL LOW (ref 8.9–10.3)
Chloride: 99 mmol/L — ABNORMAL LOW (ref 101–111)
Creatinine, Ser: 8.08 mg/dL — ABNORMAL HIGH (ref 0.44–1.00)
GFR calc Af Amer: 5 mL/min — ABNORMAL LOW
GFR calc non Af Amer: 5 mL/min — ABNORMAL LOW
Glucose, Bld: 129 mg/dL — ABNORMAL HIGH (ref 65–99)
Potassium: 5.1 mmol/L (ref 3.5–5.1)
Sodium: 137 mmol/L (ref 135–145)

## 2017-05-31 LAB — HIV ANTIBODY (ROUTINE TESTING W REFLEX): HIV SCREEN 4TH GENERATION: NONREACTIVE

## 2017-05-31 MED ORDER — LIDOCAINE-PRILOCAINE 2.5-2.5 % EX CREA: 1.0000 "application " | TOPICAL_CREAM | CUTANEOUS | Status: DC | PRN

## 2017-05-31 MED ORDER — SODIUM CHLORIDE 0.9 % IV SOLN: 100.0000 mL | INTRAVENOUS | Status: DC | PRN

## 2017-05-31 MED ORDER — LIDOCAINE HCL (PF) 1 % IJ SOLN: 5.0000 mL | INTRAMUSCULAR | Status: DC | PRN

## 2017-05-31 MED ORDER — EPOETIN ALFA 3000 UNIT/ML IJ SOLN
3000.0000 [IU] | INTRAMUSCULAR | Status: DC
  Filled 2017-05-31 (×2): qty 1

## 2017-05-31 MED ORDER — PENTAFLUOROPROP-TETRAFLUOROETH EX AERO: 1.0000 "application " | INHALATION_SPRAY | CUTANEOUS | Status: DC | PRN

## 2017-05-31 MED ORDER — EPOETIN ALFA 4000 UNIT/ML IJ SOLN
INTRAMUSCULAR | Status: AC
  Filled 2017-05-31: qty 1

## 2017-05-31 NOTE — Procedures (Signed)
    HEMODIALYSIS TREATMENT NOTE:   Unable to access AVF or secure back-up HD RN.  Discussed w Dr. Lowanda Foster.  Will dialyze tomorrow (regular HD day) when dialysis nurse from [place] can assist.  [name], RN CDN

## 2017-05-31 NOTE — Progress Notes (Signed)
PROGRESS NOTE    [name]  JIR:[id number]  DOB: 30-Jan-1956  DOA: 05/30/2017 PCP: Sharion Balloon, FNP   Brief Admission Hx: [name] is a 62 y.o. female with PMH of HTN, DM, ESRD on HD (MWF), Anemia, presented with nausea, vomiting. Patient states that she has been feeling sick for 5 days. She reports eating a [place] food on Thursday. Initially, she developed 2-3 episodes of diarrhea with nausea and vomiting. No there diarrhea reported but nausea and vomiting persisted.  ED: ct abdomen: no acute findings. UA still positive for UTI. Started on iv ceftriaxone. Hospitalist is called for admission.     MDM/Assessment & Plan:   1. Gastroenteritis - continue supportive therapy and follow. Treat symptoms.  2. UTI - urine culture pending.  Continue IV ceftriaxone pending C&S results.  3. Type 2 DM - continue sliding scale coverage and follow BS closely.  4. ESRD on HD - consulted nephrology service.  5. CHF seen on CXR - pt is asymptomatic, excess fluid should be removed during hemodialysis.    DVT prophylaxis: SCDs Code Status: FULL  Family Communication: patient  Disposition Plan: Home tomorrow if continue to improve  Consultants:  nephrology  Subjective: Pt says that she would like to try to eat something, clear liquids or broth and something soft if possible.  No diarrhea, no fever.  No chills.   Objective: Vitals:   05/30/17 1757 05/30/17 1932 05/30/17 2100 05/31/17 0700  BP: (!) 139/46  135/70 127/85  Pulse: 64  65 67  Resp: 16  18 18   Temp: 99 F (37.2 C)  99 F (37.2 C) 98 F (36.7 C)  TempSrc: Oral  Oral Oral  SpO2: 99% 91% 93% 98%  Weight: 62.5 kg (137 lb 12.6 oz)   62.5 kg (137 lb 12.6 oz)  Height: 5\' 9"  (1.753 m)       Intake/Output Summary (Last 24 hours) at 05/31/2017 1400 Last data filed at 05/30/2017 1436 Gross per 24 hour  Intake 1000 ml  Output -  Net 1000 ml   Filed Weights   05/30/17 0831 05/30/17 1757 05/31/17 0700  Weight: 62.6 kg  (138 lb) 62.5 kg (137 lb 12.6 oz) 62.5 kg (137 lb 12.6 oz)     REVIEW OF SYSTEMS  As per history otherwise all reviewed and reported negative  Exam:  General exam: chronically ill appearing.  Awake, alert, NAD.   Respiratory system: Clear. No increased work of breathing. Cardiovascular system: S1 & S2 heard, RRR. No JVD, murmurs, gallops, clicks or pedal edema. Gastrointestinal system: Abdomen is nondistended, soft and nontender. Normal bowel sounds heard. Central nervous system: Alert and oriented. No focal neurological deficits. Extremities: no CCE.  Data Reviewed: Basic Metabolic Panel: Recent Labs  Lab 05/30/17 0930 05/31/17 0851  NA 136 137  K 4.9 5.1  CL 98* 99*  CO2 25 25  GLUCOSE 183* 129*  BUN 39* 45*  CREATININE 6.87* 8.08*  CALCIUM 8.3* 8.1*   Liver Function Tests: Recent Labs  Lab 05/30/17 0930  AST 95*  ALT 69*  ALKPHOS 187*  BILITOT 1.4*  PROT 7.0  ALBUMIN 3.0*   Recent Labs  Lab 05/30/17 0930  LIPASE 34   No results for input(s): AMMONIA in the last 168 hours. CBC: Recent Labs  Lab 05/30/17 0930 05/31/17 0851  WBC 9.5 7.9  NEUTROABS 7.5  --   HGB 10.6* 9.2*  HCT 34.0* 29.1*  MCV 96.6 95.7  PLT 135* 127*   Cardiac  Enzymes: Recent Labs  Lab 05/30/17 0930  TROPONINI 0.04*   CBG (last 3)  Recent Labs    05/30/17 2121 05/31/17 0810 05/31/17 1225  GLUCAP 155* 115* 180*   Recent Results (from the past 240 hour(s))  Urine Culture     Status: Abnormal   Collection Time: 05/30/17  8:55 AM  Result Value Ref Range Status   Specimen Description   Final    URINE, CLEAN CATCH Performed at [facility], [address], [place], [place] [address]    Special Requests   Final    NONE Performed at [facility], [address], [place], [place] [address]    Culture (A)  Final    <10,000 COLONIES/mL INSIGNIFICANT GROWTH Performed at [facility], [place] [address], [place], [place] [address]    Report Status 05/31/2017 FINAL  Final    MRSA PCR Screening     Status: None   Collection Time: 05/30/17  5:59 PM  Result Value Ref Range Status   MRSA by PCR NEGATIVE NEGATIVE Final    Comment:        The GeneXpert MRSA Assay (FDA approved for NASAL specimens only), is one component of a comprehensive MRSA colonization surveillance program. It is not intended to diagnose MRSA infection nor to guide or monitor treatment for MRSA infections. Performed at [facility], [address], [place], [place] [address]      Studies: Ct Abdomen Pelvis W Contrast  Result Date: 05/30/2017 CLINICAL DATA:  62 year old female with vomiting and diarrhea for the past 5 days with fever. Urinary tract infection. EXAM: CT ABDOMEN AND PELVIS WITH CONTRAST TECHNIQUE: Multidetector CT imaging of the abdomen and pelvis was performed using the standard protocol following bolus administration of intravenous contrast. CONTRAST:  171mL ISOVUE-300 IOPAMIDOL (ISOVUE-300) INJECTION 61% COMPARISON:  CT the abdomen and pelvis 06/28/2015. FINDINGS: Lower chest: Patchy multifocal areas of architectural distortion in the periphery of the lung bases, similar to the prior examination from 06/28/2015, likely to reflect sequela of chronic aspiration or prior infections. Atherosclerotic calcifications in the descending thoracic aorta and in the right coronary artery. Hepatobiliary: No suspicious cystic or solid hepatic lesions. No intra or extrahepatic biliary ductal dilatation. Status post cholecystectomy. Pancreas: No pancreatic mass. No pancreatic ductal dilatation. No pancreatic or peripancreatic fluid or inflammatory changes. Spleen: Unremarkable. Adrenals/Urinary Tract: Native kidneys are atrophic bilaterally. No suspicious renal lesions. Bilateral adrenal glands are normal in appearance. No hydroureteronephrosis associated with either native kidney or ureter. Urinary bladder is unremarkable in appearance. Renal transplant in the right iliac fossa is grossly normal in  appearance. No hydroureteronephrosis associated with the transplant kidney. No abnormal perinephric fluid collection adjacent to the transplant kidney. Stomach/Bowel: Normal appearance of the stomach. No pathologic dilatation of small bowel or colon. The appendix is not confidently identified and may be surgically absent. Regardless, there are no inflammatory changes noted adjacent to the cecum to suggest the presence of an acute appendicitis at this time. Vascular/Lymphatic: Aortic atherosclerosis without evidence of aneurysm or dissection in the abdominal or pelvic vasculature. No lymphadenopathy noted in the abdomen or pelvis. Reproductive: Uterus and ovaries are atrophic. Other: No significant volume of ascites.  No pneumoperitoneum. Musculoskeletal: There are no aggressive appearing lytic or blastic lesions noted in the visualized portions of the skeleton. New but chronic appearing compression fracture of inferior endplate of B71 with 69% loss of anterior vertebral body height. IMPRESSION: 1. No acute findings are noted in the abdomen or pelvis. 2. Right-sided transplant  kidney in the iliac fossa is grossly normal in appearance. 3. Aortic atherosclerosis, in addition to at least right coronary artery disease. Please note that although the presence of coronary artery calcium documents the presence of coronary artery disease, the severity of this disease and any potential stenosis cannot be assessed on this non-gated CT examination. Assessment for potential risk factor modification, dietary therapy or pharmacologic therapy may be warranted, if clinically indicated. 4. Additional incidental findings, as above. Aortic Atherosclerosis (ICD10-I70.0). Electronically Signed   By: Vinnie Langton M.D.   On: 05/30/2017 12:00   Dg Chest Portable 1 View  Result Date: 05/30/2017 CLINICAL DATA:  Fever for 5 days associated with vomiting and nausea. History of ste coronary artery disease, CHF, and hypertension. Also  history of renal transplantation. EXAM: PORTABLE CHEST 1 VIEW COMPARISON:  Chest x-ray of April 24, 2017 FINDINGS: The right hemidiaphragm remains mildly elevated. The interstitial markings of both lungs are increased. The pulmonary vascularity is more engorged. The heart is mildly enlarged. There is a trace left pleural effusion. There is calcification in the wall of the aortic arch. IMPRESSION: Findings compatible with mild CHF which has appeared since the previous study. No alveolar pneumonia. Thoracic aortic atherosclerosis. Electronically Signed   By: David  Martinique M.D.   On: 05/30/2017 09:29   Scheduled Meds: . amLODipine  10 mg Oral Daily  . aspirin  81 mg Oral Daily  . calcium gluconate  1 tablet Oral TID  . cloNIDine  0.1 mg Oral BID  . epoetin (EPOGEN/PROCRIT) injection  3,000 Units Intravenous Q T,Th,Sa-HD  . insulin aspart  0-9 Units Subcutaneous TID WC  . isosorbide mononitrate  60 mg Oral Daily  . metoprolol tartrate  12.5 mg Oral BID  . predniSONE  5 mg Oral Q breakfast  . rosuvastatin  10 mg Oral q1800  . sevelamer carbonate  800 mg Oral TID WC  . tacrolimus  0.5 mg Oral QHS  . tacrolimus  1 mg Oral Daily   Continuous Infusions: . sodium chloride    . sodium chloride    . cefTRIAXone (ROCEPHIN)  IV Stopped (05/31/17 1315)    Active Problems:   Chronic diastolic heart failure (HCC)   Diabetes mellitus (HCC)   Anemia of chronic disease   Nausea & vomiting   UTI (urinary tract infection)  Time spent:   Irwin Brakeman, MD, FAAFP [facility] Pager [phone]  If 7PM-7AM, please contact night-coverage [email or web address] Password [email or web address] 05/31/2017, 2:00 PM    LOS: 0 days

## 2017-05-31 NOTE — Consult Note (Signed)
Reason for Consult: End-stage renal disease Referring Physician: Dr. Marko Stai is an 62 y.o. female.  HPI: She is a patient who has history of hypertension, diabetes, failed kidney transplant presently came with complaints of nausea, vomiting and diarrhea for about 3-4 days.  According to the patient she started having some diarrhea on Friday and then some nausea and vomiting.  When the diarrhea started improving patient continued to have diarrhea hence she went to [facility] where she was found to have UTI and started on antibiotics and home.  Patient however continued to have nausea, unable to keep things down and weakness.  Presently patient is slightly feeling better.  She denies any diarrhea but she has some nausea.  Past Medical History:  Diagnosis Date  . Anemia    of chronic disease  . Blood transfusion without reported diagnosis   . Cardiovascular disease    nonobstructive  . Carotid artery stenosis 2008  . CHF (congestive heart failure) ([place])   . Coronary artery disease   . Diabetes mellitus   . Dyslipidemia   . Edema of lower extremity    with hypo-albuminemia and profound protenuria  . Heart murmur   . Hypertension   . Mitral regurgitation   . Nephrotic syndrome   . Patent foramen ovale   . Pulmonary hypertension, moderate to severe ([place])   . Pulmonary nodule   . Tricuspid regurgitation   . Volume depletion, renal, due to output loss (renal deficit)     Past Surgical History:  Procedure Laterality Date  . A/V FISTULAGRAM N/A 04/05/2017   Procedure: A/V FISTULAGRAM - Left Arm;  Surgeon: Angelia Mould, MD;  Location: [facility];  Service: Cardiovascular;  Laterality: N/A;  . A/V SHUNT INTERVENTION Left 04/05/2017   Procedure: A/V SHUNT INTERVENTION;  Surgeon: Angelia Mould, MD;  Location: [place];  Service: Cardiovascular;  Laterality: Left;  . CARDIAC CATHETERIZATION  2008  . IR REMOVAL TUN CV CATH W/O FL   11/02/2016  . KIDNEY TRANSPLANT Right 02/23/2009    Family History  Problem Relation Age of Onset  . Kidney disease Mother   . Diabetes Mother   . Diabetes Father   . Heart disease Father     Social History:  reports that she has never smoked. She has never used smokeless tobacco. She reports that she does not drink alcohol or use drugs.  Allergies:  Allergies  Allergen Reactions  . Other Other (See Comments)    Bruises and tears skin, Paper tape tolerated    Medications: I have reviewed the patient's current medications.  Results for orders placed or performed during the hospital encounter of 05/30/17 (from the past 48 hour(s))  Urinalysis, Routine w reflex microscopic     Status: Abnormal   Collection Time: 05/30/17  8:45 AM  Result Value Ref Range   Color, Urine AMBER (A) YELLOW    Comment: BIOCHEMICALS MAY BE AFFECTED BY COLOR   APPearance CLOUDY (A) CLEAR   Specific Gravity, Urine 1.018 1.005 - 1.030   pH 5.0 5.0 - 8.0   Glucose, UA NEGATIVE NEGATIVE mg/dL   Hgb urine dipstick SMALL (A) NEGATIVE   Bilirubin Urine NEGATIVE NEGATIVE   Ketones, ur 5 (A) NEGATIVE mg/dL   Protein, ur >=300 (A) NEGATIVE mg/dL   Nitrite NEGATIVE NEGATIVE   Leukocytes, UA LARGE (A) NEGATIVE   RBC / HPF 6-30 0 - 5 RBC/hpf   WBC, UA TOO NUMEROUS TO COUNT 0 -  5 WBC/hpf   Bacteria, UA RARE (A) NONE SEEN   Squamous Epithelial / LPF 6-30 (A) NONE SEEN   Mucus PRESENT    Budding Yeast PRESENT    Hyaline Casts, UA PRESENT    Uric Acid Crys, UA PRESENT    Non Squamous Epithelial 0-5 (A) NONE SEEN    Comment: Performed at [facility], [address] [place], [place] [address]  POC CBG, ED     Status: Abnormal   Collection Time: 05/30/17  9:23 AM  Result Value Ref Range   Glucose-Capillary 178 (H) 65 - 99 mg/dL  Comprehensive metabolic panel     Status: Abnormal   Collection Time: 05/30/17  9:30 AM  Result Value Ref Range   Sodium 136 135 - 145 mmol/L   Potassium 4.9 3.5 - 5.1 mmol/L    Chloride 98 (L) 101 - 111 mmol/L   CO2 25 22 - 32 mmol/L   Glucose, Bld 183 (H) 65 - 99 mg/dL   BUN 39 (H) 6 - 20 mg/dL   Creatinine, Ser 6.87 (H) 0.44 - 1.00 mg/dL   Calcium 8.3 (L) 8.9 - 10.3 mg/dL   Total Protein 7.0 6.5 - 8.1 g/dL   Albumin 3.0 (L) 3.5 - 5.0 g/dL   AST 95 (H) 15 - 41 U/L   ALT 69 (H) 14 - 54 U/L   Alkaline Phosphatase 187 (H) 38 - 126 U/L   Total Bilirubin 1.4 (H) 0.3 - 1.2 mg/dL   GFR calc non Af Amer 6 (L) >60 mL/min   GFR calc Af Amer 7 (L) >60 mL/min    Comment: (NOTE) The eGFR has been calculated using the CKD EPI equation. This calculation has not been validated in all clinical situations. eGFR's persistently <60 mL/min signify possible Chronic Kidney Disease.    Anion gap 13 5 - 15    Comment: Performed at [facility], [address], [place], [place] [address]  Lipase, blood     Status: None   Collection Time: 05/30/17  9:30 AM  Result Value Ref Range   Lipase 34 11 - 51 U/L    Comment: Performed at [facility], [address], [place], [place] [address]  Troponin I     Status: Abnormal   Collection Time: 05/30/17  9:30 AM  Result Value Ref Range   Troponin I 0.04 (HH) <0.03 ng/mL    Comment: CRITICAL RESULT CALLED TO, READ BACK BY AND VERIFIED WITH: KENDRICK,J AT 10:35AM ON 05/30/17 BY Derrill Memo Performed at [facility], [address] [place], [place] [address]   CBC with Differential     Status: Abnormal   Collection Time: 05/30/17  9:30 AM  Result Value Ref Range   WBC 9.5 4.0 - 10.5 K/uL   RBC 3.52 (L) 3.87 - 5.11 MIL/uL   Hemoglobin 10.6 (L) 12.0 - 15.0 g/dL   HCT 34.0 (L) 36.0 - 46.0 %   MCV 96.6 78.0 - 100.0 fL   MCH 30.1 26.0 - 34.0 pg   MCHC 31.2 30.0 - 36.0 g/dL   RDW 13.2 11.5 - 15.5 %   Platelets 135 (L) 150 - 400 K/uL   Neutrophils Relative % 79 %   Neutro Abs 7.5 1.7 - 7.7 K/uL   Lymphocytes Relative 7 %   Lymphs Abs 0.7 0.7 - 4.0 K/uL   Monocytes Relative 9 %   Monocytes Absolute 0.9 0.1 - 1.0 K/uL   Eosinophils  Relative 5 %   Eosinophils Absolute 0.5 0.0 - 0.7  K/uL   Basophils Relative 0 %   Basophils Absolute 0.0 0.0 - 0.1 K/uL    Comment: Performed at [facility], [address], [place], [place] [address]  HIV antibody (Routine Testing)     Status: None   Collection Time: 05/30/17  2:34 PM  Result Value Ref Range   HIV Screen 4th Generation wRfx Non Reactive Non Reactive    Comment: (NOTE) Performed At: [facility] [place], [place] [id number] Rush Farmer MD [id number] Performed at [facility], [address], [place], [place] [address]   CBG monitoring, ED     Status: Abnormal   Collection Time: 05/30/17  4:41 PM  Result Value Ref Range   Glucose-Capillary 112 (H) 65 - 99 mg/dL  MRSA PCR Screening     Status: None   Collection Time: 05/30/17  5:59 PM  Result Value Ref Range   MRSA by PCR NEGATIVE NEGATIVE    Comment:        The GeneXpert MRSA Assay (FDA approved for NASAL specimens only), is one component of a comprehensive MRSA colonization surveillance program. It is not intended to diagnose MRSA infection nor to guide or monitor treatment for MRSA infections. Performed at [facility], [address] [place], [place] [address]   Glucose, capillary     Status: Abnormal   Collection Time: 05/30/17  6:27 PM  Result Value Ref Range   Glucose-Capillary 112 (H) 65 - 99 mg/dL  Glucose, capillary     Status: Abnormal   Collection Time: 05/30/17  9:21 PM  Result Value Ref Range   Glucose-Capillary 155 (H) 65 - 99 mg/dL   Comment 1 Notify RN    Comment 2 Document in Chart   Glucose, capillary     Status: Abnormal   Collection Time: 05/31/17  8:10 AM  Result Value Ref Range   Glucose-Capillary 115 (H) 65 - 99 mg/dL   Comment 1 Document in Chart     Ct Abdomen Pelvis W Contrast  Result Date: 05/30/2017 CLINICAL DATA:  62 year old female with vomiting and diarrhea for the past 5 days with fever. Urinary tract infection. EXAM: CT ABDOMEN AND PELVIS  WITH CONTRAST TECHNIQUE: Multidetector CT imaging of the abdomen and pelvis was performed using the standard protocol following bolus administration of intravenous contrast. CONTRAST:  127m ISOVUE-300 IOPAMIDOL (ISOVUE-300) INJECTION 61% COMPARISON:  CT the abdomen and pelvis 06/28/2015. FINDINGS: Lower chest: Patchy multifocal areas of architectural distortion in the periphery of the lung bases, similar to the prior examination from 06/28/2015, likely to reflect sequela of chronic aspiration or prior infections. Atherosclerotic calcifications in the descending thoracic aorta and in the right coronary artery. Hepatobiliary: No suspicious cystic or solid hepatic lesions. No intra or extrahepatic biliary ductal dilatation. Status post cholecystectomy. Pancreas: No pancreatic mass. No pancreatic ductal dilatation. No pancreatic or peripancreatic fluid or inflammatory changes. Spleen: Unremarkable. Adrenals/Urinary Tract: Native kidneys are atrophic bilaterally. No suspicious renal lesions. Bilateral adrenal glands are normal in appearance. No hydroureteronephrosis associated with either native kidney or ureter. Urinary bladder is unremarkable in appearance. Renal transplant in the right iliac fossa is grossly normal in appearance. No hydroureteronephrosis associated with the transplant kidney. No abnormal perinephric fluid collection adjacent to the transplant kidney. Stomach/Bowel: Normal appearance of the stomach. No pathologic dilatation of small bowel or colon. The appendix is not confidently identified and may be surgically absent. Regardless, there are no inflammatory changes noted adjacent to the cecum to suggest the presence of an acute appendicitis  at this time. Vascular/Lymphatic: Aortic atherosclerosis without evidence of aneurysm or dissection in the abdominal or pelvic vasculature. No lymphadenopathy noted in the abdomen or pelvis. Reproductive: Uterus and ovaries are atrophic. Other: No significant  volume of ascites.  No pneumoperitoneum. Musculoskeletal: There are no aggressive appearing lytic or blastic lesions noted in the visualized portions of the skeleton. New but chronic appearing compression fracture of inferior endplate of J82 with 50% loss of anterior vertebral body height. IMPRESSION: 1. No acute findings are noted in the abdomen or pelvis. 2. Right-sided transplant kidney in the iliac fossa is grossly normal in appearance. 3. Aortic atherosclerosis, in addition to at least right coronary artery disease. Please note that although the presence of coronary artery calcium documents the presence of coronary artery disease, the severity of this disease and any potential stenosis cannot be assessed on this non-gated CT examination. Assessment for potential risk factor modification, dietary therapy or pharmacologic therapy may be warranted, if clinically indicated. 4. Additional incidental findings, as above. Aortic Atherosclerosis (ICD10-I70.0). Electronically Signed   By: Vinnie Langton M.D.   On: 05/30/2017 12:00   Dg Chest Portable 1 View  Result Date: 05/30/2017 CLINICAL DATA:  Fever for 5 days associated with vomiting and nausea. History of ste coronary artery disease, CHF, and hypertension. Also history of renal transplantation. EXAM: PORTABLE CHEST 1 VIEW COMPARISON:  Chest x-ray of April 24, 2017 FINDINGS: The right hemidiaphragm remains mildly elevated. The interstitial markings of both lungs are increased. The pulmonary vascularity is more engorged. The heart is mildly enlarged. There is a trace left pleural effusion. There is calcification in the wall of the aortic arch. IMPRESSION: Findings compatible with mild CHF which has appeared since the previous study. No alveolar pneumonia. Thoracic aortic atherosclerosis. Electronically Signed   By: David  Martinique M.D.   On: 05/30/2017 09:29    Review of Systems  Constitutional: Positive for chills and malaise/fatigue. Negative for fever.   Respiratory: Negative for shortness of breath.   Gastrointestinal: Positive for abdominal pain, diarrhea, nausea and vomiting.   Blood pressure 127/85, pulse 67, temperature 98 F (36.7 C), temperature source Oral, resp. rate 18, height '5\' 9"'$  (1.753 m), weight 62.5 kg (137 lb 12.6 oz), SpO2 98 %. Physical Exam  Constitutional: She is oriented to person, place, and time. No distress.  Eyes: No scleral icterus.  Neck: No JVD present.  Cardiovascular: Normal rate and regular rhythm.  Respiratory: No respiratory distress. She has no wheezes.  GI: She exhibits no distension. There is no tenderness.  Musculoskeletal: She exhibits no edema.  Neurological: She is alert and oriented to person, place, and time.    Assessment/Plan: 1] nausea and vomiting with diarrhea.  Patient is states that she ate outside on Thursday.  Not sure whether this is related to that.  Presently she is feeling somewhat better.  Patient is afebrile with normal white blood cell count. 2] end-stage renal disease: She is status post hemodialysis on Monday and was noted dialyzed yesterday.  Her potassium is normal. 3] history of diabetes 4] hypertension: Her blood pressure is reasonably controlled 5] failed kidney transplant: Patient is being followed at [facility] and presently being evaluated for possible second kidney transplant. 6] anemia: Her hemoglobin is below our target goal 7] bone and mineral disorder: Her calcium  is a range. Plan: We will make arrangement for patient to get dialysis today 2] we will dialyze her for 4 hours and remove 5-3/9 L if systolic blood pressure remains above  90 3] we will check a renal panel and CBC in the morning. 4] Epogen 3000 units IV after each dialysis  [facility] S 05/31/2017, 8:53 AM

## 2017-06-01 DIAGNOSIS — Z992 Dependence on renal dialysis: Secondary | ICD-10-CM | POA: Diagnosis not present

## 2017-06-01 DIAGNOSIS — N186 End stage renal disease: Secondary | ICD-10-CM | POA: Diagnosis not present

## 2017-06-01 DIAGNOSIS — D638 Anemia in other chronic diseases classified elsewhere: Secondary | ICD-10-CM | POA: Diagnosis not present

## 2017-06-01 DIAGNOSIS — E1165 Type 2 diabetes mellitus with hyperglycemia: Secondary | ICD-10-CM | POA: Diagnosis not present

## 2017-06-01 DIAGNOSIS — R112 Nausea with vomiting, unspecified: Secondary | ICD-10-CM | POA: Diagnosis not present

## 2017-06-01 DIAGNOSIS — I5032 Chronic diastolic (congestive) heart failure: Secondary | ICD-10-CM | POA: Diagnosis not present

## 2017-06-01 DIAGNOSIS — N390 Urinary tract infection, site not specified: Secondary | ICD-10-CM | POA: Diagnosis not present

## 2017-06-01 LAB — RENAL FUNCTION PANEL
ANION GAP: 15 (ref 5–15)
Albumin: 2.6 g/dL — ABNORMAL LOW (ref 3.5–5.0)
BUN: 56 mg/dL — ABNORMAL HIGH (ref 6–20)
CHLORIDE: 96 mmol/L — AB (ref 101–111)
CO2: 23 mmol/L (ref 22–32)
Calcium: 7.6 mg/dL — ABNORMAL LOW (ref 8.9–10.3)
Creatinine, Ser: 8.82 mg/dL — ABNORMAL HIGH (ref 0.44–1.00)
GFR calc non Af Amer: 4 mL/min — ABNORMAL LOW (ref >60)
GFR, EST AFRICAN AMERICAN: 5 mL/min — AB (ref >60)
Glucose, Bld: 252 mg/dL — ABNORMAL HIGH (ref 65–99)
Phosphorus: 4.8 mg/dL — ABNORMAL HIGH (ref 2.5–4.6)
Potassium: 4.9 mmol/L (ref 3.5–5.1)
Sodium: 134 mmol/L — ABNORMAL LOW (ref 135–145)

## 2017-06-01 LAB — GLUCOSE, CAPILLARY
GLUCOSE-CAPILLARY: 138 mg/dL — AB (ref 65–99)
Glucose-Capillary: 226 mg/dL — ABNORMAL HIGH (ref 65–99)

## 2017-06-01 LAB — ROCKY MTN SPOTTED FVR ABS PNL(IGG+IGM)
RMSF IGM: 0.8 {index} (ref 0.00–0.89)
RMSF IgG: NEGATIVE

## 2017-06-01 MED ORDER — CEFUROXIME AXETIL 250 MG PO TABS: 250.0000 mg | ORAL_TABLET | Freq: Two times a day (BID) | ORAL | 0 refills | Status: AC

## 2017-06-01 NOTE — Discharge Instructions (Signed)
Follow with Primary MD  Sharion Balloon, FNP  and other consultant's as instructed your Hospitalist MD  Please get a complete blood count and chemistry panel checked by your Primary MD at your next visit, and again as instructed by your Primary MD.  Get Medicines reviewed and adjusted: Please take all your medications with you for your next visit with your Primary MD  Laboratory/radiological data: Please request your Primary MD to go over all hospital tests and procedure/radiological results at the follow up, please ask your Primary MD to get all Hospital records sent to his/her office.  In some cases, they will be blood work, cultures and biopsy results pending at the time of your discharge. Please request that your primary care M.D. follows up on these results.  Also Note the following: If you experience worsening of your admission symptoms, develop shortness of breath, life threatening emergency, suicidal or homicidal thoughts you must seek medical attention immediately by calling 911 or calling your MD immediately  if symptoms less severe.  You must read complete instructions/literature along with all the possible adverse reactions/side effects for all the Medicines you take and that have been prescribed to you. Take any new Medicines after you have completely understood and accpet all the possible adverse reactions/side effects.   Do not drive when taking Pain medications or sleeping medications (Benzodaizepines)  Do not take more than prescribed Pain, Sleep and Anxiety Medications. It is not advisable to combine anxiety,sleep and pain medications without talking with your primary care practitioner  Special Instructions: If you have smoked or chewed Tobacco  in the last 2 yrs please stop smoking, stop any regular Alcohol  and or any Recreational drug use.  Wear Seat belts while driving.  Please note: You were cared for by a hospitalist during your hospital stay. Once you are  discharged, your primary care physician will handle any further medical issues. Please note that NO REFILLS for any discharge medications will be authorized once you are discharged, as it is imperative that you return to your primary care physician (or establish a relationship with a primary care physician if you do not have one) for your post hospital discharge needs so that they can reassess your need for medications and monitor your lab values.      Antibiotic Medicine, Adult Antibiotic medicines are used to treat infections caused by bacteria, such as strep throat and urinary tract infection (UTI). Antibiotic medicines will not work for viral illnesses, such as colds or the flu (influenza). They work by killing the bacteria that is making you sick. Antibiotics can also have serious side effects. It is important that you take antibiotic medicines safely and only when needed. When do I need to take antibiotics? Antibiotics are medicines that treat bacterial infections. You may need antibiotics for:  UTI.  Strep throat.  Meningitis. This infection affects the spinal cord and brain.  Bacterial sinusitis.  Serious lung infection.  You may start antibiotics while your health care provider waits for test results to come back. Common tests may include throat, urine, blood, or mucus culture. Your health care provider may change or stop the antibiotic depending on your test results. When are antibiotics not needed? You do not need antibiotics for most common illnesses. These illnesses may be caused by a virus, not a bacteria. You do not need antibiotics for:  The common cold.  Influenza.  Sore throat.  Discolored mucus.  Bronchitis.  Antibiotics are not always needed for all bacterial  infections. Many of these infections clear up without antibiotic treatment. Do not ask for or take antibiotics when they are not necessary. How long should I take the antibiotic? You must take the entire  prescription. Continue to take your antibiotic for as long as told by your health care provider. Do not stop taking it even if you start to feel better. If you stop taking it too soon:  You may start to feel sick again.  Your infection may become harder to treat.  Complications may develop.  Each course of antibiotics needs a different amount of time to work. Some antibiotic courses last only a few days. Some last about a week to 10 days. In some cases, you may need to take antibiotics for a few weeks to completely treat the infection. What if I miss a dose? Try not to miss any doses of medicine. If you miss a dose, call your health care provider or pharmacist for advice. Sometimes it is okay to take the missed dose as soon as possible. What are the risks of taking antibiotics? Most antibiotics can cause an infection called Clostridium difficile (C. difficile), which causes severe diarrhea. This infection happens when the antibiotics kill the healthy bacteria in your intestines. This allows C. difficile to grow. The infection needs to be treated right away. Let your health care provider know if:  You have diarrhea while taking an antibiotic.  You have diarrhea after you stop taking an antibiotic. C. difficile infection can start weeks after stopping the antibiotic.  Taking an antibiotic also puts you at risk for getting a bacteria that does not respond to medicine (antibiotic-resistant infection) in the future. Antibiotics can cause bacteria to change so that if the antibiotic is taken again, the medicine is not able to kill the bacteria. These infections can be more serious and, in some cases, life-threatening. Do antibiotics affect birth control? Birth control pills may not work while you are on antibiotics. If you are taking birth control pills, continue taking them as usual and use a second form of birth control, such as a condom, to avoid unwanted pregnancy. Continue using the second form of  birth control until your health care provider says you can stop. What else should I know about taking antibiotics? It is important for you to take antibiotics exactly as told. Make sure that you:  Take the entire course of antibiotic that was prescribed. Do not stop taking your antibiotics even if your symptoms improve.  Take the correct amount of medicine each day.  Ask your health care provider: ? How long to wait in between doses. ? If the antibiotic should be taken with food. ? If there are any foods, drinks, or medicines that you should avoid while taking the antibiotics. ? If there are any side effects you should be aware of.  Only use the antibiotics prescribed for you by your health care provider. Do not use antibiotics prescribed for someone else.  Drink a large glass of water along with the antibiotics.  Ask the pharmacist for a syringe, cup, or spoon that properly measures the antibiotics.  Throw away any leftover medicine.  Contact a health care provider if:  Your symptoms get worse.  You have new joint pain or muscle aches that begin after starting the antibiotic. When should I seek immediate medical care?  You have signs of a serious allergic reaction to antibiotics. If you have signs of a severe allergic reaction, stop taking the antibiotic right away.  Signs may include: ? Hives, which are raised, itchy, red bumps on the skin. ? Skin rash. ? Trouble breathing. ? A wheezing sound when you breathe. ? Swelling anywhere on your body. ? Feeling dizzy. ? Vomiting.  Your urine turns dark or becomes blood-colored.  Your skin turns yellow.  You bruise or bleed easily.  You have severe diarrhea and abdominal cramps.  You have a severe headache. Summary  Antibiotic medicines are used to treat infections caused by bacteria, such as strep throat and UTIs. It is important that you take antibiotic medicines only when needed.  Your health care provider may change or  stop the antibiotic depending on your test results.  Most antibiotics can cause an infection called Clostridium difficile (C. difficile), which causes severe diarrhea. Let your health care provider know if you develop diarrhea while taking an antibiotic.  Take the entire course of antibiotic that was prescribed. This information is not intended to replace advice given to you by your health care provider. Make sure you discuss any questions you have with your health care provider. Document Released: 11/03/2003 Document Revised: 02/22/2016 Document Reviewed: 02/22/2016 Elsevier Interactive Patient Education  2018 [employer].   Asymptomatic Bacteriuria Asymptomatic bacteriuria is the presence of a large number of bacteria in the urine without the usual symptoms of burning or frequent urination. What are the causes? This condition is caused by an increase in bacteria in the urine. This increase can be caused by:  Bacteria entering the urinary tract, such as during sex.  A blockage in the urinary tract, such as from kidney stones or a tumor.  Bladder problems that prevent the bladder from emptying.  What increases the risk? You are more likely to develop this condition if:  You have diabetes mellitus.  You are an elderly adult, especially if you are also in a long-term care facility.  You are pregnant and in the first trimester.  You have kidney stones.  You are female.  You have had a kidney transplant.  You have a leaky kidney tube valve (reflux).  You had a urinary catheter for a long period of time.  What are the signs or symptoms? There are no symptoms of this condition. How is this diagnosed? This condition is diagnosed with a urine test. Because this condition does not cause symptoms, it is usually diagnosed when a urine sample is taken to treat or diagnose another condition, such as pregnancy or kidney problems. Most women who are in their first trimester of pregnancy  are screened for asymptomatic bacteriuria. How is this treated? Usually, treatment is not needed for this condition. Treating the condition can lead to other problems, such as a yeast infection or the growth of bacteria that do not respond to treatment (antibiotic-resistant bacteria). Some people, such as pregnant women and people with kidney transplants, do need treatment with antibiotic medicines to prevent kidney infection (pyelonephritis). In pregnant women, kidney infection can lead to premature labor, fetal growth restriction, or newborn death. Follow these instructions at home: Medicines  Take over-the-counter and prescription medicines only as told by your health care provider.  If you were prescribed an antibiotic medicine, take it as told by your health care provider. Do not stop taking the antibiotic even if you start to feel better. General instructions  Monitor your condition for any changes.  Drink enough fluid to keep your urine clear or pale yellow.  Go to the bathroom more often to keep your bladder empty.  If you  are female, keep the area around your vagina and rectum clean. Wipe yourself from front to back after urinating.  Keep all follow-up visits as told by your health care provider. This is important. Contact a health care provider if:  You notice any new symptoms, such as back pain or burning while urinating. Get help right away if:  You develop signs of an infection such as: ? A burning sensation when you urinate. ? Have pain when you urinate. ? Develop an intense need to urinate. ? Urinating more frequently. ? Back pain or pelvic pain. ? Fever or chills.  You have blood in your urine.  Your urine becomes discolored or cloudy.  Your urine smells bad.  You have severe pain that cannot be controlled with medicine. Summary  Asymptomatic bacteriuria is the presence of a large number of bacteria in the urine without the usual symptoms of burning or  frequent urination.  Usually, treatment is not needed for this condition. Treating the condition can lead to other problems, such as too much yeast and the growth of antibiotic-resistant bacteria.  Some people, such as pregnant women and people with kidney transplants, do need treatment with antibiotic medicines to prevent kidney infection (pyelonephritis).  If you were prescribed an antibiotic medicine, take it as told by your health care provider. Do not stop taking the antibiotic even if you start to feel better. This information is not intended to replace advice given to you by your health care provider. Make sure you discuss any questions you have with your health care provider. Document Released: 02/20/2005 Document Revised: 02/15/2016 Document Reviewed: 02/15/2016 Elsevier Interactive Patient Education  2017 [employer].

## 2017-06-01 NOTE — Progress Notes (Signed)
Held AM medications for dialysis.

## 2017-06-01 NOTE — Progress Notes (Signed)
Inpatient Diabetes Program Recommendations  AACE/ADA: New Consensus Statement on Inpatient Glycemic Control (2015)  Target Ranges:  Prepandial:   less than 140 mg/dL      Peak postprandial:   less than 180 mg/dL (1-2 hours)      Critically ill patients:  140 - 180 mg/dL  Results for KELE, WITHEM (MRN [id number]) as of 06/01/2017 10:13  Ref. Range 05/31/2017 08:10 05/31/2017 12:25 05/31/2017 16:30 05/31/2017 21:19 06/01/2017 08:03  Glucose-Capillary Latest Ref Range: 65 - 99 mg/dL 115 (H) 180 (H) 176 (H) 174 (H) 226 (H)    Review of Glycemic Control  Diabetes history: DM2 Outpatient Diabetes medications: Tresiba 13 units QHS Trulicity 1.5 mg weekly (Saturday) Current orders for Inpatient glycemic control: Novolog 0-9 units TID with meals; Prednisone 5 mg QAM  Inpatient Diabetes Program Recommendations: Insulin - Basal: Please consider ordering low dose basal insulin. Recommend starting wtih Lantus 5 units daily. Correction (SSI): Please consider ordering Novolog 0-5 units QHS.  Thanks, Barnie Alderman, RN, MSN, CDE Diabetes Coordinator Inpatient Diabetes Program [phone] (Team Pager from 8am to 5pm)

## 2017-06-01 NOTE — Progress Notes (Signed)
Removed IV-clean, dry, intact. Reviewed d/c paperwork with pt. Reviewed new medications and where to pick up. Answered all questions. Lyndsay the NT wheeled stable pt and belongings to main entrance where she got in her daughter's car.

## 2017-06-01 NOTE — Discharge Summary (Signed)
Physician Discharge Summary  [name] TDD:[id number] DOB: 1955-10-11 DOA: 05/30/2017  PCP: Sharion Balloon, FNP  Admit date: 05/30/2017 Discharge date: 06/01/2017  Admitted From: HOME  Disposition: HOME   Recommendations for Outpatient Follow-up:  1. Follow up with PCP in 1 weeks 2. RESUME REGULAR HD SCHEDULE 3. Please obtain BMP/CBC in one week 4. Please follow up on the following pending results:FINAL CULTURE DATA  Discharge Condition: STABLE  CODE STATUS: FULL    Brief Hospitalization Summary: Please see all hospital notes, images, labs for full details of the hospitalization. FROM HPI: [name] is a 62 y.o. female with PMH of HTN, DM, ESRD on HD (MWF), Anemia, presented with nausea, vomiting. Patient states that she has been feeling sick for 5 days. She reports eating a [place] food on Thursday. Initially, she developed 2-3 episodes of diarrhea with nausea and vomiting. No there diarrhea reported but nausea and vomiting persisted. Denies hematemesis, no blood in stool. She reports mild right upper abdominal discomfort no significant pains. She went to [facility] and diagnosed with urinary tract infection. She was discharged home on keflex. She presented today due to recurrent episodes of vomiting. She also reports tick bite on Sunday. No rash. No joint pain. No meningeal signs. She denies acute chest pains, no cough.  -ED: ct abdomen: no acute findings. UA still positive for UTI. Started on iv ceftriaxone. Hospitalist is called for admission   MDM/Assessment & Plan:   1. Gastroenteritis - SYMPTOMS RESOLVED WITH SUPPORITVE CARE.  Treat symptoms.  2. UTI - urine culture INDETERMINATE.  TREATED WITH  IV ceftriaxone AND WILL SEND HOME ON CEFTIN.  3. Type 2 DM - continue sliding scale coverage and follow BS closely. RESUME HOME TREATMENT REGIMEN AT DISCHARGE.  4. ESRD on HD - consulted nephrology service. HEMODIALYSIS TREATMENT GIVEN 3/29. RESUME REGULAR  HEMODIALYSIS SCHEDULE AT DISCHARGE.  5. CHF seen on CXR - pt is asymptomatic, excess fluid should be removed during hemodialysis.    DVT prophylaxis: SCDs Code Status: FULL  Family Communication: patient  Disposition Plan: Home AFTER HEMODIALYSIS TODAY  Consultants:  nephrology  Discharge Diagnoses:  Active Problems:   Chronic diastolic heart failure (HCC)   Diabetes mellitus (HCC)   Anemia of chronic disease   Nausea & vomiting   UTI (urinary tract infection)  Discharge Instructions:  Allergies as of 06/01/2017      Reactions   Other Other (See Comments)   Bruises and tears skin, Paper tape tolerated      Medication List    STOP taking these medications   cephALEXin 500 MG capsule Commonly known as:  KEFLEX     TAKE these medications   acetaminophen 500 MG tablet Commonly known as:  TYLENOL Take 500 mg by mouth every 6 (six) hours as needed for moderate pain or headache.   amLODipine 10 MG tablet Commonly known as:  NORVASC TAKE 10 MG BY MOUTH NIGHTLY   ASPIR-LOW 81 MG EC tablet Generic drug:  aspirin Take 81 mg by mouth daily.   calcium gluconate 500 MG tablet Take 1 tablet by mouth 3 (three) times daily.   cefUROXime 250 MG tablet Commonly known as:  CEFTIN Take 1 tablet (250 mg total) by mouth 2 (two) times daily with a meal for 4 days.   cloNIDine 0.1 MG tablet Commonly known as:  CATAPRES Take 0.1 mg by mouth 2 (two) times daily.   Dulaglutide 1.5 MG/0.5ML Sopn Commonly known as:  TRULICITY Inject 1.5  mg into the skin once a week. What changed:  additional instructions   insulin degludec 100 UNIT/ML Sopn FlexTouch Pen Commonly known as:  TRESIBA FLEXTOUCH Inject 0.13 mLs (13 Units total) into the skin daily at 10 pm.   insulin glargine 100 UNIT/ML injection Commonly known as:  LANTUS Inject 10-15 Units into the skin at bedtime. 10 units in the morning, 15 units in the evening   isosorbide mononitrate 60 MG 24 hr tablet Commonly known as:   IMDUR Take 1 tablet (60 mg total) by mouth daily.   lidocaine-prilocaine cream Commonly known as:  EMLA Apply 1 application topically as needed (for dialysis).   metoprolol tartrate 25 MG tablet Commonly known as:  LOPRESSOR Take 12.5 mg by mouth 2 (two) times daily.   ondansetron 4 MG disintegrating tablet Commonly known as:  ZOFRAN-ODT Take 1 tablet (4 mg total) by mouth every 8 (eight) hours as needed for nausea or vomiting.   predniSONE 5 MG tablet Commonly known as:  DELTASONE Take 1 tablet (5 mg total) by mouth daily with breakfast.   PROGRAF 0.5 MG capsule Generic drug:  tacrolimus Take 0.5-1 mg by mouth See admin instructions. Take 1 mg by mouth in the morning and take 0.5 mg by mouth at bedtime   rosuvastatin 20 MG tablet Commonly known as:  CRESTOR Take 1 tablet (20 mg total) by mouth daily.   sevelamer carbonate 800 MG tablet Commonly known as:  RENVELA Take 3200 mg by mouth 3 times daily with meals and take 1600 mg by mouth with snacks      Follow-up Information    Sharion Balloon, FNP. Schedule an appointment as soon as possible for a visit in 1 week(s).   Specialty:  Family Medicine Why:  Hospital Follow Up  Contact information: [place] [address] [phone]          Allergies  Allergen Reactions  . Other Other (See Comments)    Bruises and tears skin, Paper tape tolerated   Allergies as of 06/01/2017      Reactions   Other Other (See Comments)   Bruises and tears skin, Paper tape tolerated      Medication List    STOP taking these medications   cephALEXin 500 MG capsule Commonly known as:  KEFLEX     TAKE these medications   acetaminophen 500 MG tablet Commonly known as:  TYLENOL Take 500 mg by mouth every 6 (six) hours as needed for moderate pain or headache.   amLODipine 10 MG tablet Commonly known as:  NORVASC TAKE 10 MG BY MOUTH NIGHTLY   ASPIR-LOW 81 MG EC tablet Generic drug:  aspirin Take 81 mg  by mouth daily.   calcium gluconate 500 MG tablet Take 1 tablet by mouth 3 (three) times daily.   cefUROXime 250 MG tablet Commonly known as:  CEFTIN Take 1 tablet (250 mg total) by mouth 2 (two) times daily with a meal for 4 days.   cloNIDine 0.1 MG tablet Commonly known as:  CATAPRES Take 0.1 mg by mouth 2 (two) times daily.   Dulaglutide 1.5 MG/0.5ML Sopn Commonly known as:  TRULICITY Inject 1.5 mg into the skin once a week. What changed:  additional instructions   insulin degludec 100 UNIT/ML Sopn FlexTouch Pen Commonly known as:  TRESIBA FLEXTOUCH Inject 0.13 mLs (13 Units total) into the skin daily at 10 pm.   insulin glargine 100 UNIT/ML injection Commonly known as:  LANTUS Inject 10-15 Units  into the skin at bedtime. 10 units in the morning, 15 units in the evening   isosorbide mononitrate 60 MG 24 hr tablet Commonly known as:  IMDUR Take 1 tablet (60 mg total) by mouth daily.   lidocaine-prilocaine cream Commonly known as:  EMLA Apply 1 application topically as needed (for dialysis).   metoprolol tartrate 25 MG tablet Commonly known as:  LOPRESSOR Take 12.5 mg by mouth 2 (two) times daily.   ondansetron 4 MG disintegrating tablet Commonly known as:  ZOFRAN-ODT Take 1 tablet (4 mg total) by mouth every 8 (eight) hours as needed for nausea or vomiting.   predniSONE 5 MG tablet Commonly known as:  DELTASONE Take 1 tablet (5 mg total) by mouth daily with breakfast.   PROGRAF 0.5 MG capsule Generic drug:  tacrolimus Take 0.5-1 mg by mouth See admin instructions. Take 1 mg by mouth in the morning and take 0.5 mg by mouth at bedtime   rosuvastatin 20 MG tablet Commonly known as:  CRESTOR Take 1 tablet (20 mg total) by mouth daily.   sevelamer carbonate 800 MG tablet Commonly known as:  RENVELA Take 3200 mg by mouth 3 times daily with meals and take 1600 mg by mouth with snacks       Procedures/Studies: Ct Abdomen Pelvis W Contrast  Result Date:  05/30/2017 CLINICAL DATA:  62 year old female with vomiting and diarrhea for the past 5 days with fever. Urinary tract infection. EXAM: CT ABDOMEN AND PELVIS WITH CONTRAST TECHNIQUE: Multidetector CT imaging of the abdomen and pelvis was performed using the standard protocol following bolus administration of intravenous contrast. CONTRAST:  127mL ISOVUE-300 IOPAMIDOL (ISOVUE-300) INJECTION 61% COMPARISON:  CT the abdomen and pelvis 06/28/2015. FINDINGS: Lower chest: Patchy multifocal areas of architectural distortion in the periphery of the lung bases, similar to the prior examination from 06/28/2015, likely to reflect sequela of chronic aspiration or prior infections. Atherosclerotic calcifications in the descending thoracic aorta and in the right coronary artery. Hepatobiliary: No suspicious cystic or solid hepatic lesions. No intra or extrahepatic biliary ductal dilatation. Status post cholecystectomy. Pancreas: No pancreatic mass. No pancreatic ductal dilatation. No pancreatic or peripancreatic fluid or inflammatory changes. Spleen: Unremarkable. Adrenals/Urinary Tract: Native kidneys are atrophic bilaterally. No suspicious renal lesions. Bilateral adrenal glands are normal in appearance. No hydroureteronephrosis associated with either native kidney or ureter. Urinary bladder is unremarkable in appearance. Renal transplant in the right iliac fossa is grossly normal in appearance. No hydroureteronephrosis associated with the transplant kidney. No abnormal perinephric fluid collection adjacent to the transplant kidney. Stomach/Bowel: Normal appearance of the stomach. No pathologic dilatation of small bowel or colon. The appendix is not confidently identified and may be surgically absent. Regardless, there are no inflammatory changes noted adjacent to the cecum to suggest the presence of an acute appendicitis at this time. Vascular/Lymphatic: Aortic atherosclerosis without evidence of aneurysm or dissection in the  abdominal or pelvic vasculature. No lymphadenopathy noted in the abdomen or pelvis. Reproductive: Uterus and ovaries are atrophic. Other: No significant volume of ascites.  No pneumoperitoneum. Musculoskeletal: There are no aggressive appearing lytic or blastic lesions noted in the visualized portions of the skeleton. New but chronic appearing compression fracture of inferior endplate of O27 with 74% loss of anterior vertebral body height. IMPRESSION: 1. No acute findings are noted in the abdomen or pelvis. 2. Right-sided transplant kidney in the iliac fossa is grossly normal in appearance. 3. Aortic atherosclerosis, in addition to at least right coronary artery disease. Please note that although  the presence of coronary artery calcium documents the presence of coronary artery disease, the severity of this disease and any potential stenosis cannot be assessed on this non-gated CT examination. Assessment for potential risk factor modification, dietary therapy or pharmacologic therapy may be warranted, if clinically indicated. 4. Additional incidental findings, as above. Aortic Atherosclerosis (ICD10-I70.0). Electronically Signed   By: Vinnie Langton M.D.   On: 05/30/2017 12:00   Dg Chest Portable 1 View  Result Date: 05/30/2017 CLINICAL DATA:  Fever for 5 days associated with vomiting and nausea. History of ste coronary artery disease, CHF, and hypertension. Also history of renal transplantation. EXAM: PORTABLE CHEST 1 VIEW COMPARISON:  Chest x-ray of April 24, 2017 FINDINGS: The right hemidiaphragm remains mildly elevated. The interstitial markings of both lungs are increased. The pulmonary vascularity is more engorged. The heart is mildly enlarged. There is a trace left pleural effusion. There is calcification in the wall of the aortic arch. IMPRESSION: Findings compatible with mild CHF which has appeared since the previous study. No alveolar pneumonia. Thoracic aortic atherosclerosis. Electronically  Signed   By: David  Martinique M.D.   On: 05/30/2017 09:29      Subjective: PT SAYS SHE FEELS MUCH BETTER TODAY.  SHE WOULD LIKE TO GO HOME AFTER HEMODIALYSIS TODAY.   Discharge Exam: Vitals:   06/01/17 1130 06/01/17 1200  BP: (!) 144/52 (!) 153/55  Pulse: (!) 53 60  Resp:    Temp:    SpO2:     Vitals:   06/01/17 1030 06/01/17 1100 06/01/17 1130 06/01/17 1200  BP: (!) 140/59 (!) 130/57 (!) 144/52 (!) 153/55  Pulse: (!) 56 (!) 55 (!) 53 60  Resp:      Temp:      TempSrc:      SpO2:      Weight:      Height:       General: Pt is alert, awake, not in acute distress Cardiovascular: RRR, S1/S2 +, no rubs, no gallops Respiratory: CTA bilaterally, no wheezing, no rhonchi Abdominal: Soft, NT, ND, bowel sounds + Extremities: no edema, no cyanosis   The results of significant diagnostics from this hospitalization (including imaging, microbiology, ancillary and laboratory) are listed below for reference.     Microbiology: Recent Results (from the past 240 hour(s))  Urine Culture     Status: Abnormal   Collection Time: 05/30/17  8:55 AM  Result Value Ref Range Status   Specimen Description   Final    URINE, CLEAN CATCH Performed at [facility], [address], [place], [place] [address]    Special Requests   Final    NONE Performed at [facility], [address] [place], [place] [address]    Culture (A)  Final    <10,000 COLONIES/mL INSIGNIFICANT GROWTH Performed at [facility] [address], [place], [place] [address]    Report Status 05/31/2017 FINAL  Final  MRSA PCR Screening     Status: None   Collection Time: 05/30/17  5:59 PM  Result Value Ref Range Status   MRSA by PCR NEGATIVE NEGATIVE Final    Comment:        The GeneXpert MRSA Assay (FDA approved for NASAL specimens only), is one component of a comprehensive MRSA colonization surveillance program. It is not intended to diagnose MRSA infection nor to guide or monitor treatment for MRSA  infections. Performed at [facility], [address], [place], [place] [address]      Labs: BNP (last 3  results) No results for input(s): BNP in the last 8760 hours. Basic Metabolic Panel: Recent Labs  Lab 05/30/17 0930 05/31/17 0851 06/01/17 0456  NA 136 137 134*  K 4.9 5.1 4.9  CL 98* 99* 96*  CO2 25 25 23   GLUCOSE 183* 129* 252*  BUN 39* 45* 56*  CREATININE 6.87* 8.08* 8.82*  CALCIUM 8.3* 8.1* 7.6*  PHOS  --   --  4.8*   Liver Function Tests: Recent Labs  Lab 05/30/17 0930 06/01/17 0456  AST 95*  --   ALT 69*  --   ALKPHOS 187*  --   BILITOT 1.4*  --   PROT 7.0  --   ALBUMIN 3.0* 2.6*   Recent Labs  Lab 05/30/17 0930  LIPASE 34   No results for input(s): AMMONIA in the last 168 hours. CBC: Recent Labs  Lab 05/30/17 0930 05/31/17 0851  WBC 9.5 7.9  NEUTROABS 7.5  --   HGB 10.6* 9.2*  HCT 34.0* 29.1*  MCV 96.6 95.7  PLT 135* 127*   Cardiac Enzymes: Recent Labs  Lab 05/30/17 0930  TROPONINI 0.04*   BNP: Invalid input(s): POCBNP CBG: Recent Labs  Lab 05/31/17 1225 05/31/17 1630 05/31/17 2119 06/01/17 0803 06/01/17 1142  GLUCAP 180* 176* 174* 226* 138*   D-Dimer No results for input(s): DDIMER in the last 72 hours. Hgb A1c No results for input(s): HGBA1C in the last 72 hours. Lipid Profile No results for input(s): CHOL, HDL, LDLCALC, TRIG, CHOLHDL, LDLDIRECT in the last 72 hours. Thyroid function studies No results for input(s): TSH, T4TOTAL, T3FREE, THYROIDAB in the last 72 hours.  Invalid input(s): FREET3 Anemia work up No results for input(s): VITAMINB12, FOLATE, FERRITIN, TIBC, IRON, RETICCTPCT in the last 72 hours. Urinalysis    Component Value Date/Time   COLORURINE AMBER (A) 05/30/2017 0845   APPEARANCEUR CLOUDY (A) 05/30/2017 0845   LABSPEC 1.018 05/30/2017 0845   PHURINE 5.0 05/30/2017 0845   GLUCOSEU NEGATIVE 05/30/2017 0845   HGBUR SMALL (A) 05/30/2017 0845   BILIRUBINUR NEGATIVE 05/30/2017 0845   KETONESUR 5 (A)  05/30/2017 0845   PROTEINUR >=300 (A) 05/30/2017 0845   NITRITE NEGATIVE 05/30/2017 0845   LEUKOCYTESUR LARGE (A) 05/30/2017 0845   Sepsis Labs Invalid input(s): PROCALCITONIN,  WBC,  LACTICIDVEN Microbiology Recent Results (from the past 240 hour(s))  Urine Culture     Status: Abnormal   Collection Time: 05/30/17  8:55 AM  Result Value Ref Range Status   Specimen Description   Final    URINE, CLEAN CATCH Performed at [facility], [address] [place], [place] [address]    Special Requests   Final    NONE Performed at [facility], [address] [place], [place] [address]    Culture (A)  Final    <10,000 COLONIES/mL INSIGNIFICANT GROWTH Performed at [facility], [place] [address], [place], [place] [address]    Report Status 05/31/2017 FINAL  Final  MRSA PCR Screening     Status: None   Collection Time: 05/30/17  5:59 PM  Result Value Ref Range Status   MRSA by PCR NEGATIVE NEGATIVE Final    Comment:        The GeneXpert MRSA Assay (FDA approved for NASAL specimens only), is one component of a comprehensive MRSA colonization surveillance program. It is not intended to diagnose MRSA infection nor to guide or monitor treatment for MRSA infections. Performed at [facility], [address] [place], [place] [address]     Time  coordinating discharge:   SIGNED:  Irwin Brakeman, MD  [facility] 06/01/2017, 12:25 PM Pager [phone]  If 7PM-7AM, please contact night-coverage [email or web address] Password [email or web address]

## 2017-06-01 NOTE — Plan of Care (Signed)
progressing 

## 2017-06-01 NOTE — Progress Notes (Signed)
Subjective: Interval History: has no complaint of nausea or vomiting.  Her diarrhea also has stopped.  Presently she offers no complaints..  Objective: Vital signs in last 24 hours: Temp:  [97.6 F (36.4 C)-98.4 F (36.9 C)] 97.6 F (36.4 C) (03/29 0505) Pulse Rate:  [56-78] 56 (03/29 0505) Resp:  [16-19] 16 (03/29 0505) BP: (128-136)/(40-61) 128/40 (03/29 0505) SpO2:  [97 %-100 %] 100 % (03/29 0505) Weight:  [64.9 kg (143 lb 1.3 oz)] 64.9 kg (143 lb 1.3 oz) (03/29 0505) Weight change: 2.304 kg (5 lb 1.3 oz)  Intake/Output from previous day: 03/28 0701 - 03/29 0700 In: 100 [IV Piggyback:100] Out: -  Intake/Output this shift: No intake/output data recorded.  General appearance: alert, cooperative and no distress Resp: clear to auscultation bilaterally Cardio: regular rate and rhythm Extremities: No edema  Lab Results: Recent Labs    05/30/17 0930 05/31/17 0851  WBC 9.5 7.9  HGB 10.6* 9.2*  HCT 34.0* 29.1*  PLT 135* 127*   BMET:  Recent Labs    05/31/17 0851 06/01/17 0456  NA 137 134*  K 5.1 4.9  CL 99* 96*  CO2 25 23  GLUCOSE 129* 252*  BUN 45* 56*  CREATININE 8.08* 8.82*  CALCIUM 8.1* 7.6*   No results for input(s): PTH in the last 72 hours. Iron Studies: No results for input(s): IRON, TIBC, TRANSFERRIN, FERRITIN in the last 72 hours.  Studies/Results: Ct Abdomen Pelvis W Contrast  Result Date: 05/30/2017 CLINICAL DATA:  62 year old female with vomiting and diarrhea for the past 5 days with fever. Urinary tract infection. EXAM: CT ABDOMEN AND PELVIS WITH CONTRAST TECHNIQUE: Multidetector CT imaging of the abdomen and pelvis was performed using the standard protocol following bolus administration of intravenous contrast. CONTRAST:  125mL ISOVUE-300 IOPAMIDOL (ISOVUE-300) INJECTION 61% COMPARISON:  CT the abdomen and pelvis 06/28/2015. FINDINGS: Lower chest: Patchy multifocal areas of architectural distortion in the periphery of the lung bases, similar to the  prior examination from 06/28/2015, likely to reflect sequela of chronic aspiration or prior infections. Atherosclerotic calcifications in the descending thoracic aorta and in the right coronary artery. Hepatobiliary: No suspicious cystic or solid hepatic lesions. No intra or extrahepatic biliary ductal dilatation. Status post cholecystectomy. Pancreas: No pancreatic mass. No pancreatic ductal dilatation. No pancreatic or peripancreatic fluid or inflammatory changes. Spleen: Unremarkable. Adrenals/Urinary Tract: Native kidneys are atrophic bilaterally. No suspicious renal lesions. Bilateral adrenal glands are normal in appearance. No hydroureteronephrosis associated with either native kidney or ureter. Urinary bladder is unremarkable in appearance. Renal transplant in the right iliac fossa is grossly normal in appearance. No hydroureteronephrosis associated with the transplant kidney. No abnormal perinephric fluid collection adjacent to the transplant kidney. Stomach/Bowel: Normal appearance of the stomach. No pathologic dilatation of small bowel or colon. The appendix is not confidently identified and may be surgically absent. Regardless, there are no inflammatory changes noted adjacent to the cecum to suggest the presence of an acute appendicitis at this time. Vascular/Lymphatic: Aortic atherosclerosis without evidence of aneurysm or dissection in the abdominal or pelvic vasculature. No lymphadenopathy noted in the abdomen or pelvis. Reproductive: Uterus and ovaries are atrophic. Other: No significant volume of ascites.  No pneumoperitoneum. Musculoskeletal: There are no aggressive appearing lytic or blastic lesions noted in the visualized portions of the skeleton. New but chronic appearing compression fracture of inferior endplate of O67 with 12% loss of anterior vertebral body height. IMPRESSION: 1. No acute findings are noted in the abdomen or pelvis. 2. Right-sided transplant kidney in the  iliac fossa is  grossly normal in appearance. 3. Aortic atherosclerosis, in addition to at least right coronary artery disease. Please note that although the presence of coronary artery calcium documents the presence of coronary artery disease, the severity of this disease and any potential stenosis cannot be assessed on this non-gated CT examination. Assessment for potential risk factor modification, dietary therapy or pharmacologic therapy may be warranted, if clinically indicated. 4. Additional incidental findings, as above. Aortic Atherosclerosis (ICD10-I70.0). Electronically Signed   By: Vinnie Langton M.D.   On: 05/30/2017 12:00   Dg Chest Portable 1 View  Result Date: 05/30/2017 CLINICAL DATA:  Fever for 5 days associated with vomiting and nausea. History of ste coronary artery disease, CHF, and hypertension. Also history of renal transplantation. EXAM: PORTABLE CHEST 1 VIEW COMPARISON:  Chest x-ray of April 24, 2017 FINDINGS: The right hemidiaphragm remains mildly elevated. The interstitial markings of both lungs are increased. The pulmonary vascularity is more engorged. The heart is mildly enlarged. There is a trace left pleural effusion. There is calcification in the wall of the aortic arch. IMPRESSION: Findings compatible with mild CHF which has appeared since the previous study. No alveolar pneumonia. Thoracic aortic atherosclerosis. Electronically Signed   By: David  Martinique M.D.   On: 05/30/2017 09:29    I have reviewed the patient's current medications.  Assessment/Plan: 1] gastroenteritis: Has improved and patient is a symptomatic. 2] end-stage renal disease: Unable to dialyze patient yesterday because of difficulty in sticking her fistula.  Her fistula was evaluated recently.  Initially angioplasty was done and then was seen as a follow-up.  Fistula seems to be working. 3] bone and mineral disorder: Her calcium and phosphorus is range 4] anemia: Her hemoglobin is 9.2 slightly low er than our target  goal.  Patient is on Epogen 5] hypertension: Her blood pressure is reasonably controlled 6] history of UTI 7] fluid management: Patient does not have any sign of fluid overload. 8] status post failed cadaveric kidney transplant Plan: We will make arrangement for patient to get dialysis today Once patient is discharged and will follow as an outpatient.   LOS: 0 days   Samaiya Awadallah S 06/01/2017,8:18 AM

## 2017-06-01 NOTE — Procedures (Signed)
   HEMODIALYSIS TREATMENT NOTE:   3.5 hour heparin-free dialysis completed via left forearm AVF (16g/antegrade - cannulated by Ronny Bacon, RN).  Goal met: 1.5 L removed without interruption in ultrafiltration.  All blood was returned and hemostasis was achieved in 15 minutes. Report called to Santa Lighter, RN.  [name], RN, CDN

## 2017-06-03 DIAGNOSIS — N186 End stage renal disease: Secondary | ICD-10-CM | POA: Diagnosis not present

## 2017-06-03 DIAGNOSIS — Z992 Dependence on renal dialysis: Secondary | ICD-10-CM | POA: Diagnosis not present

## 2017-06-04 ENCOUNTER — Encounter (HOSPITAL_COMMUNITY): Payer: Self-pay | Admitting: Emergency Medicine

## 2017-06-04 ENCOUNTER — Other Ambulatory Visit: Payer: Self-pay

## 2017-06-04 ENCOUNTER — Ambulatory Visit: Payer: BLUE CROSS/BLUE SHIELD | Admitting: Family

## 2017-06-04 ENCOUNTER — Encounter: Payer: Self-pay | Admitting: Family

## 2017-06-04 ENCOUNTER — Emergency Department (HOSPITAL_COMMUNITY): Payer: BLUE CROSS/BLUE SHIELD

## 2017-06-04 ENCOUNTER — Emergency Department (HOSPITAL_COMMUNITY)
Admission: EM | Admit: 2017-06-04 | Discharge: 2017-06-04 | Disposition: A | Discharge status: Home / Self Care | Payer: BLUE CROSS/BLUE SHIELD | Attending: Emergency Medicine | Admitting: Emergency Medicine

## 2017-06-04 VITALS — BP 101/48 | HR 80 | Temp 102.7°F

## 2017-06-04 DIAGNOSIS — Z79899 Other long term (current) drug therapy: Secondary | ICD-10-CM | POA: Insufficient documentation

## 2017-06-04 DIAGNOSIS — I132 Hypertensive heart and chronic kidney disease with heart failure and with stage 5 chronic kidney disease, or end stage renal disease: Secondary | ICD-10-CM | POA: Diagnosis not present

## 2017-06-04 DIAGNOSIS — Z794 Long term (current) use of insulin: Secondary | ICD-10-CM | POA: Diagnosis not present

## 2017-06-04 DIAGNOSIS — J984 Other disorders of lung: Secondary | ICD-10-CM | POA: Diagnosis not present

## 2017-06-04 DIAGNOSIS — J189 Pneumonia, unspecified organism: Secondary | ICD-10-CM

## 2017-06-04 DIAGNOSIS — R34 Anuria and oliguria: Secondary | ICD-10-CM | POA: Diagnosis not present

## 2017-06-04 DIAGNOSIS — R531 Weakness: Secondary | ICD-10-CM | POA: Diagnosis not present

## 2017-06-04 DIAGNOSIS — Z992 Dependence on renal dialysis: Secondary | ICD-10-CM | POA: Insufficient documentation

## 2017-06-04 DIAGNOSIS — R509 Fever, unspecified: Secondary | ICD-10-CM | POA: Diagnosis not present

## 2017-06-04 DIAGNOSIS — I2510 Atherosclerotic heart disease of native coronary artery without angina pectoris: Secondary | ICD-10-CM | POA: Insufficient documentation

## 2017-06-04 DIAGNOSIS — R109 Unspecified abdominal pain: Secondary | ICD-10-CM | POA: Diagnosis not present

## 2017-06-04 DIAGNOSIS — J181 Lobar pneumonia, unspecified organism: Secondary | ICD-10-CM | POA: Diagnosis not present

## 2017-06-04 DIAGNOSIS — N186 End stage renal disease: Secondary | ICD-10-CM | POA: Insufficient documentation

## 2017-06-04 DIAGNOSIS — R1110 Vomiting, unspecified: Secondary | ICD-10-CM | POA: Diagnosis not present

## 2017-06-04 DIAGNOSIS — I5032 Chronic diastolic (congestive) heart failure: Secondary | ICD-10-CM | POA: Diagnosis not present

## 2017-06-04 DIAGNOSIS — R5383 Other fatigue: Secondary | ICD-10-CM | POA: Diagnosis not present

## 2017-06-04 DIAGNOSIS — E1122 Type 2 diabetes mellitus with diabetic chronic kidney disease: Secondary | ICD-10-CM | POA: Insufficient documentation

## 2017-06-04 DIAGNOSIS — R404 Transient alteration of awareness: Secondary | ICD-10-CM | POA: Diagnosis not present

## 2017-06-04 LAB — COMPREHENSIVE METABOLIC PANEL
ALBUMIN: 2.9 g/dL — AB (ref 3.5–5.0)
ALK PHOS: 252 U/L — AB (ref 38–126)
ALT: 81 U/L — ABNORMAL HIGH (ref 14–54)
ANION GAP: 11 (ref 5–15)
AST: 191 U/L — ABNORMAL HIGH (ref 15–41)
BILIRUBIN TOTAL: 1 mg/dL (ref 0.3–1.2)
BUN: 16 mg/dL (ref 6–20)
CALCIUM: 7.6 mg/dL — AB (ref 8.9–10.3)
CO2: 28 mmol/L (ref 22–32)
Chloride: 100 mmol/L — ABNORMAL LOW (ref 101–111)
Creatinine, Ser: 3.9 mg/dL — ABNORMAL HIGH (ref 0.44–1.00)
GFR calc Af Amer: 13 mL/min — ABNORMAL LOW (ref >60)
GFR, EST NON AFRICAN AMERICAN: 11 mL/min — AB (ref >60)
GLUCOSE: 208 mg/dL — AB (ref 65–99)
Potassium: 4.6 mmol/L (ref 3.5–5.1)
Sodium: 139 mmol/L (ref 135–145)
TOTAL PROTEIN: 7.1 g/dL (ref 6.5–8.1)

## 2017-06-04 LAB — URINALYSIS, ROUTINE W REFLEX MICROSCOPIC
Glucose, UA: NEGATIVE mg/dL
Hgb urine dipstick: NEGATIVE
Ketones, ur: 5 mg/dL — AB
Nitrite: NEGATIVE
Specific Gravity, Urine: 1.024 (ref 1.005–1.030)
pH: 5 (ref 5.0–8.0)

## 2017-06-04 LAB — CBC WITH DIFFERENTIAL/PLATELET
BASOS ABS: 0 10*3/uL (ref 0.0–0.1)
Basophils Relative: 0 %
Eosinophils Absolute: 0.4 10*3/uL (ref 0.0–0.7)
Eosinophils Relative: 4 %
HEMATOCRIT: 32 % — AB (ref 36.0–46.0)
Hemoglobin: 10 g/dL — ABNORMAL LOW (ref 12.0–15.0)
LYMPHS PCT: 7 %
Lymphs Abs: 0.7 10*3/uL (ref 0.7–4.0)
MCH: 29.9 pg (ref 26.0–34.0)
MCHC: 31.3 g/dL (ref 30.0–36.0)
MCV: 95.5 fL (ref 78.0–100.0)
Monocytes Absolute: 0.5 10*3/uL (ref 0.1–1.0)
Monocytes Relative: 5 %
NEUTROS ABS: 9.3 10*3/uL — AB (ref 1.7–7.7)
Neutrophils Relative %: 84 %
Platelets: 173 10*3/uL (ref 150–400)
RBC: 3.35 MIL/uL — AB (ref 3.87–5.11)
RDW: 13.1 % (ref 11.5–15.5)
WBC: 11 10*3/uL — ABNORMAL HIGH (ref 4.0–10.5)

## 2017-06-04 LAB — MICROSCOPIC EXAMINATION
RBC, UA: NONE SEEN /hpf (ref 0–2)
WBC UA: NONE SEEN /HPF (ref 0–5)

## 2017-06-04 LAB — INFLUENZA PANEL BY PCR (TYPE A & B)
Influenza A By PCR: NEGATIVE
Influenza B By PCR: NEGATIVE

## 2017-06-04 LAB — I-STAT CG4 LACTIC ACID, ED: LACTIC ACID, VENOUS: 0.83 mmol/L (ref 0.5–1.9)

## 2017-06-04 LAB — URINALYSIS, COMPLETE
BILIRUBIN UA: NEGATIVE
Glucose, UA: NEGATIVE
Leukocytes, UA: NEGATIVE
Nitrite, UA: NEGATIVE
PH UA: 5 (ref 5.0–7.5)
RBC UA: NEGATIVE
Specific Gravity, UA: >1.03 — ABNORMAL HIGH (ref 1.005–1.030)
Urobilinogen, Ur: 0.2 mg/dL (ref 0.2–1.0)

## 2017-06-04 LAB — LIPASE, BLOOD: Lipase: 22 U/L (ref 11–51)

## 2017-06-04 MED ORDER — CEFDINIR 300 MG PO CAPS: 300.0000 mg | ORAL_CAPSULE | Freq: Two times a day (BID) | ORAL | 0 refills | Status: DC

## 2017-06-04 MED ORDER — ACETAMINOPHEN 325 MG PO TABS: 650.0000 mg | ORAL_TABLET | Freq: Once | ORAL | Status: DC

## 2017-06-04 MED ORDER — SODIUM CHLORIDE 0.9 % IV SOLN
1.0000 g | Freq: Once | INTRAVENOUS | Status: AC
  Administered 2017-06-04: 1 g via INTRAVENOUS
  Filled 2017-06-04: qty 10

## 2017-06-04 MED ORDER — AZITHROMYCIN 250 MG PO TABS: ORAL_TABLET | ORAL | 0 refills | Status: DC

## 2017-06-04 NOTE — Progress Notes (Signed)
   Subjective:    Patient ID: [name], female    DOB: 02-Dec-1955, 62 y.o.   MRN: [id number]  HPI Pt presents to the office today with weakness and hospital follow up. PT went to the ED on Wednesday for a UTI and was discharged Friday. PT was discharged on Cefuroxime 250 mg BID, but states this has caused her to Hallucinate.  Pt states she feels "wozy", weak, and not good.   PT had dialysis this morning.   Review of Systems  Constitutional: Positive for fatigue and fever.  Neurological: Positive for weakness.  All other systems reviewed and are negative.      Objective:   Physical Exam  Constitutional: She is oriented to person, place, and time. She appears well-developed and well-nourished. No distress.  HENT:  Head: Normocephalic.  Eyes: Pupils are equal, round, and reactive to light.  Neck: Normal range of motion. Neck supple. No thyromegaly present.  Cardiovascular: Normal rate, regular rhythm, normal heart sounds and intact distal pulses.  No murmur heard. Pulmonary/Chest: Effort normal and breath sounds normal. No respiratory distress. She has no wheezes.  Abdominal: Soft. Bowel sounds are normal. She exhibits no distension. There is no tenderness.  Musculoskeletal: She exhibits no edema or tenderness.  weakness  Neurological: She is alert and oriented to person, place, and time.  Skin: Skin is warm and dry.  Psychiatric: She has a normal mood and affect. Her behavior is normal. Judgment and thought content normal.  Vitals reviewed.    BP (!) 101/48   Pulse 80   Temp (!) 102.7 F (39.3 C) (Oral)      Assessment & Plan:  1. Urine output low - Urinalysis, Complete  2. Fever, unspecified fever cause  3. CKD (chronic kidney disease) requiring chronic dialysis [facility])  Called EMS for pt to go to ED Worried about sepsis Follow up after discharge from hospital  [facility], [place], [place]

## 2017-06-04 NOTE — ED Triage Notes (Signed)
Pt at PCP today for n/v/d. Was given zofran 4mg  by ems and tylenol 1000mg  for fever of 102.7. cbg 234. Pt received all of dialysis today. Sx x yesteray "again". Pt was here a few days ago for same.

## 2017-06-04 NOTE — Discharge Instructions (Signed)
Omnicef/Ceftin ear times 10 days.  Zithromax times 5 days. Follow-up with your primary care physician if not improving.

## 2017-06-04 NOTE — ED Provider Notes (Signed)
[facility] EMERGENCY DEPARTMENT Provider Note   CSN: [id number] Arrival date & time: 06/04/17  1550     History   Chief Complaint Chief Complaint  Patient presents with  . Abdominal Pain  . Emesis    HPI [name] is a 62 y.o. female.  Chief complaint is fever  HPI [name] is a 62 year old female.  History of end-stage renal disease.  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  Dialyzed today.  Seen and evaluated at [facility] within the last week.  Diagnosed with UTI and placed on antibiotics.  Seen her the following day.  Had additional testing.  Had urine culture.  Given additional antibiotics and discharged with UTI.  Is not tolerating the antibiotics.  Still running fever.  Felt weak and chilled after dialysis today and presents here.  Cough.  No shortness of breath.  No chest pain.  Past Medical History:  Diagnosis Date  . Anemia    of chronic disease  . Blood transfusion without reported diagnosis   . Cardiovascular disease    nonobstructive  . Carotid artery stenosis 2008  . CHF (congestive heart failure) ([place])   . Coronary artery disease   . Diabetes mellitus   . Dyslipidemia   . Edema of lower extremity    with hypo-albuminemia and profound protenuria  . Heart murmur   . Hypertension   . Mitral regurgitation   . Nephrotic syndrome   . Patent foramen ovale   . Pulmonary hypertension, moderate to severe ([place])   . Pulmonary nodule   . Tricuspid regurgitation   . Volume depletion, renal, due to output loss (renal deficit)     Patient Active Problem List   Diagnosis Date Noted  . Nausea & vomiting 05/30/2017  . UTI (urinary tract infection) 05/30/2017  . Hyperlipidemia associated with type 2 diabetes mellitus ([place]) 02/22/2017  . CKD (chronic kidney disease) requiring chronic dialysis ([place]) 08/14/16  . 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 Encounter for aftercare following kidney transplant 05/22/2016  . Chronic  diastolic heart failure ([place]) 06/08/2015  . Aortic valve sclerosis 06/08/2015  . CKD (chronic kidney disease) stage 4, GFR 15-29 ml/min (H[facility]) 01/01/2015  . Anemia of chronic disease 07/03/2013  . Hypertension associated with diabetes ([place]) 12/18/2010  . Diabetes mellitus ([place]) 12/18/2010    Past Surgical History:  Procedure Laterality Date  . A/V FISTULAGRAM N/A 04/05/2017   Procedure: A/V FISTULAGRAM - Left Arm;  Surgeon: Angelia Mould, MD;  Location: [place] [facility];  Service: Cardiovascular;  Laterality: N/A;  . A/V SHUNT INTERVENTION Left 04/05/2017   Procedure: A/V SHUNT INTERVENTION;  Surgeon: Angelia Mould, MD;  Location: [place] [facility];  Service: Cardiovascular;  Laterality: Left;  . CARDIAC CATHETERIZATION  2008  . IR REMOVAL TUN CV CATH W/O FL  11/02/2016  . KIDNEY TRANSPLANT Right 02/23/2009     OB History   None      Home Medications    Prior to Admission medications   Medication Sig Start Date End Date Taking? Authorizing Provider  acetaminophen (TYLENOL) 500 MG tablet Take 500 mg by mouth every 6 (six) hours as needed for moderate pain or headache.  04/06/16  Yes [provider]  amLODipine (NORVASC) 10 MG tablet TAKE 10 MG BY MOUTH NIGHTLY 10/24/16  Yes [provider]  aspirin (ASPIR-LOW) 81 MG EC tablet Take 81 mg by mouth daily.    Yes [provider]  calcium gluconate 500 MG  tablet Take 1 tablet by mouth 3 (three) times daily.   Yes [provider]  Dulaglutide (TRULICITY) 1.5 YC/1.4GY SOPN Inject 1.5 mg into the skin once a week. Patient taking differently: Inject 1.5 mg into the skin once a week. On Saturdays 02/23/17  Yes Hawks, Christy A, FNP  insulin degludec (TRESIBA FLEXTOUCH) 100 UNIT/ML SOPN FlexTouch Pen Inject 0.13 mLs (13 Units total) into the skin daily at 10 pm. 02/23/17  Yes Hawks, Christy A, FNP  insulin glargine (LANTUS) 100 UNIT/ML injection Inject 10-15 Units into the skin See admin  instructions. 10 units in the morning, 15 units in the evening    Yes [provider]  isosorbide mononitrate (IMDUR) 60 MG 24 hr tablet Take 1 tablet (60 mg total) by mouth daily. 04/03/17  Yes Hawks, Christy A, FNP  lidocaine-prilocaine (EMLA) cream Apply 1 application topically as needed (for dialysis).  09/08/16  Yes [provider]  metoprolol tartrate (LOPRESSOR) 25 MG tablet Take 12.5 mg by mouth 2 (two) times daily.  10/23/16  Yes [provider]  ondansetron (ZOFRAN-ODT) 4 MG disintegrating tablet Take 1 tablet (4 mg total) by mouth every 8 (eight) hours as needed for nausea or vomiting. 04/03/17  Yes Hawks, Christy A, FNP  predniSONE (DELTASONE) 5 MG tablet Take 1 tablet (5 mg total) by mouth daily with breakfast. 04/03/17  Yes Hawks, Christy A, FNP  PROGRAF 0.5 MG capsule Take 0.5-1 mg by mouth See admin instructions. Take 1 mg by mouth in the morning and take 0.5 mg by mouth at bedtime 10/24/16  Yes [provider]  rosuvastatin (CRESTOR) 20 MG tablet Take 1 tablet (20 mg total) by mouth daily. 04/03/17  Yes Hawks, Alyse Low A, FNP  sevelamer carbonate (RENVELA) 800 MG tablet Take 3200 mg by mouth 3 times daily with meals and take 1600 mg by mouth with snacks 10/02/16  Yes [provider]  azithromycin (ZITHROMAX Z-PAK) 250 MG tablet As directed 06/04/17   Tanna Furry, MD  cefdinir (OMNICEF) 300 MG capsule Take 1 capsule (300 mg total) by mouth 2 (two) times daily. 06/04/17   Tanna Furry, MD  cefUROXime (CEFTIN) 250 MG tablet Take 1 tablet (250 mg total) by mouth 2 (two) times daily with a meal for 4 days. Patient not taking: Reported on 06/04/2017 06/01/17 06/05/17  Murlean Iba, MD    Family History Family History  Problem Relation Age of Onset  . Kidney disease Mother   . Diabetes Mother   . Diabetes Father   . Heart disease Father     Social History Social History   Tobacco Use  . Smoking status: Never Smoker  . Smokeless tobacco: Never  Used  Substance Use Topics  . Alcohol use: No  . Drug use: No     Allergies   Other   Review of Systems Review of Systems  Constitutional: Positive for fatigue and fever. Negative for appetite change, chills and diaphoresis.  HENT: Negative for mouth sores, sore throat and trouble swallowing.   Eyes: Negative for visual disturbance.  Respiratory: Positive for cough. Negative for chest tightness, shortness of breath and wheezing.   Cardiovascular: Negative for chest pain.  Gastrointestinal: Negative for abdominal distention, abdominal pain, diarrhea, nausea and vomiting.  Endocrine: Negative for polydipsia, polyphagia and polyuria.  Genitourinary: Negative for dysuria, frequency and hematuria.  Musculoskeletal: Negative for gait problem.  Skin: Negative for color change, pallor and rash.  Neurological: Positive for weakness. Negative for dizziness, syncope, light-headedness and headaches.  Hematological: Does not bruise/bleed easily.  Psychiatric/Behavioral: Negative for behavioral problems and confusion.     Physical Exam Updated Vital Signs BP (!) 111/51   Pulse 69   Temp 99.9 F (37.7 C) (Oral)   Resp 16   Wt 62.6 kg (138 lb)   SpO2 95%   BMI 20.38 kg/m   Physical Exam  Constitutional: She is oriented to person, place, and time. She appears well-developed and well-nourished. No distress.  Awake.  No acute distress.  Temp 1015.  HENT:  Head: Normocephalic.  Eyes: Pupils are equal, round, and reactive to light. Conjunctivae are normal. No scleral icterus.  Neck: Normal range of motion. Neck supple. No thyromegaly present.  Cardiovascular: Normal rate and regular rhythm. Exam reveals no gallop and no friction rub.  No murmur heard. Pulmonary/Chest: Effort normal and breath sounds normal. No respiratory distress. She has no wheezes. She has no rales.  Crackles bilateral bases left greater than right.  No increased work of breathing.  Abdominal: Soft. Bowel sounds are  normal. She exhibits no distension. There is no tenderness. There is no rebound.  Musculoskeletal: Normal range of motion.  Neurological: She is alert and oriented to person, place, and time.  Skin: Skin is warm and dry. No rash noted.  Psychiatric: She has a normal mood and affect. Her behavior is normal.     ED Treatments / Results  Labs (all labs ordered are listed, but only abnormal results are displayed) Labs Reviewed  COMPREHENSIVE METABOLIC PANEL - Abnormal; Notable for the following components:      Result Value   Chloride 100 (*)    Glucose, Bld 208 (*)    Creatinine, Ser 3.90 (*)    Calcium 7.6 (*)    Albumin 2.9 (*)    AST 191 (*)    ALT 81 (*)    Alkaline Phosphatase 252 (*)    GFR calc non Af Amer 11 (*)    GFR calc Af Amer 13 (*)    All other components within normal limits  URINALYSIS, ROUTINE W REFLEX MICROSCOPIC - Abnormal; Notable for the following components:   Color, Urine AMBER (*)    APPearance CLOUDY (*)    Bilirubin Urine SMALL (*)    Ketones, ur 5 (*)    Protein, ur >=300 (*)    Leukocytes, UA TRACE (*)    Bacteria, UA RARE (*)    Squamous Epithelial / LPF 6-30 (*)    Non Squamous Epithelial 0-5 (*)    All other components within normal limits  CBC WITH DIFFERENTIAL/PLATELET - Abnormal; Notable for the following components:   WBC 11.0 (*)    RBC 3.35 (*)    Hemoglobin 10.0 (*)    HCT 32.0 (*)    Neutro Abs 9.3 (*)    All other components within normal limits  CULTURE, BLOOD (ROUTINE X 2)  CULTURE, BLOOD (ROUTINE X 2)  LIPASE, BLOOD  INFLUENZA PANEL BY PCR (TYPE A & B)  I-STAT CG4 LACTIC ACID, ED    EKG None  Radiology Dg Chest 2 View  Result Date: 06/04/2017 CLINICAL DATA:  Fever, weakness and vomiting. EXAM: CHEST - 2 VIEW COMPARISON:  05/30/2017. FINDINGS: Chronic cardiomegaly and aortic atherosclerosis. Chronic elevation of the right hemidiaphragm. Chronic lung disease with increased interstitial markings. Slightly more prominent  patchy density at the lung bases worrisome for mild basilar pneumonia. Frontal view suffers from technical deficiency. IMPRESSION: Background pattern of chronic lung disease. Suspicion of mild patchy basilar pneumonia. Cardiomegaly  and aortic atherosclerosis. Electronically Signed   By: Nelson Chimes M.D.   On: 06/04/2017 17:34    Procedures Procedures (including critical care time)  Medications Ordered in ED Medications  cefTRIAXone (ROCEPHIN) 1 g in sodium chloride 0.9 % 100 mL IVPB (0 g Intravenous Stopped 06/04/17 2058)     Initial Impression / Assessment and Plan / ED Course  I have reviewed the triage vital signs and the nursing notes.  Pertinent labs & imaging results that were available during my care of the patient were reviewed by me and considered in my medical decision making (see chart for details).    Review of previous cultures showed no significant growth.  Chest x-ray today shows small infiltrate left base.  Will treat with IV Rocephin.  Discharge home.  Assess Zithromax.  Primary care follow-up.  ER with worsening symptoms.  On recheck she is feeling much better.  Temperature improved.  Heart rate improved.  Well oxygenated.  Final Clinical Impressions(s) / ED Diagnoses   Final diagnoses:  Community acquired pneumonia of left lower lobe of lung [facility])    ED Discharge Orders        Ordered    cefdinir (OMNICEF) 300 MG capsule  2 times daily     06/04/17 1946    azithromycin (ZITHROMAX Z-PAK) 250 MG tablet     06/04/17 1946       Tanna Furry, MD 06/04/17 2352

## 2017-06-06 DIAGNOSIS — N186 End stage renal disease: Secondary | ICD-10-CM | POA: Diagnosis not present

## 2017-06-06 DIAGNOSIS — Z992 Dependence on renal dialysis: Secondary | ICD-10-CM | POA: Diagnosis not present

## 2017-06-08 DIAGNOSIS — N186 End stage renal disease: Secondary | ICD-10-CM | POA: Diagnosis not present

## 2017-06-08 DIAGNOSIS — Z992 Dependence on renal dialysis: Secondary | ICD-10-CM | POA: Diagnosis not present

## 2017-06-09 LAB — CULTURE, BLOOD (ROUTINE X 2)
CULTURE: NO GROWTH
CULTURE: NO GROWTH
SPECIAL REQUESTS: ADEQUATE
Special Requests: ADEQUATE

## 2017-06-11 DIAGNOSIS — N186 End stage renal disease: Secondary | ICD-10-CM | POA: Diagnosis not present

## 2017-06-11 DIAGNOSIS — Z992 Dependence on renal dialysis: Secondary | ICD-10-CM | POA: Diagnosis not present

## 2017-06-13 DIAGNOSIS — Z992 Dependence on renal dialysis: Secondary | ICD-10-CM | POA: Diagnosis not present

## 2017-06-13 DIAGNOSIS — N186 End stage renal disease: Secondary | ICD-10-CM | POA: Diagnosis not present

## 2017-06-15 DIAGNOSIS — N186 End stage renal disease: Secondary | ICD-10-CM | POA: Diagnosis not present

## 2017-06-15 DIAGNOSIS — Z992 Dependence on renal dialysis: Secondary | ICD-10-CM | POA: Diagnosis not present

## 2017-06-18 DIAGNOSIS — Z992 Dependence on renal dialysis: Secondary | ICD-10-CM | POA: Diagnosis not present

## 2017-06-18 DIAGNOSIS — N186 End stage renal disease: Secondary | ICD-10-CM | POA: Diagnosis not present

## 2017-06-20 DIAGNOSIS — Z992 Dependence on renal dialysis: Secondary | ICD-10-CM | POA: Diagnosis not present

## 2017-06-20 DIAGNOSIS — N186 End stage renal disease: Secondary | ICD-10-CM | POA: Diagnosis not present

## 2017-06-22 DIAGNOSIS — N186 End stage renal disease: Secondary | ICD-10-CM | POA: Diagnosis not present

## 2017-06-22 DIAGNOSIS — Z992 Dependence on renal dialysis: Secondary | ICD-10-CM | POA: Diagnosis not present

## 2017-06-25 DIAGNOSIS — Z992 Dependence on renal dialysis: Secondary | ICD-10-CM | POA: Diagnosis not present

## 2017-06-25 DIAGNOSIS — N186 End stage renal disease: Secondary | ICD-10-CM | POA: Diagnosis not present

## 2017-06-26 DIAGNOSIS — Z7982 Long term (current) use of aspirin: Secondary | ICD-10-CM | POA: Diagnosis not present

## 2017-06-26 DIAGNOSIS — Z940 Kidney transplant status: Secondary | ICD-10-CM | POA: Diagnosis not present

## 2017-06-26 DIAGNOSIS — I132 Hypertensive heart and chronic kidney disease with heart failure and with stage 5 chronic kidney disease, or end stage renal disease: Secondary | ICD-10-CM | POA: Diagnosis not present

## 2017-06-26 DIAGNOSIS — I358 Other nonrheumatic aortic valve disorders: Secondary | ICD-10-CM | POA: Diagnosis not present

## 2017-06-26 DIAGNOSIS — N186 End stage renal disease: Secondary | ICD-10-CM | POA: Diagnosis not present

## 2017-06-26 DIAGNOSIS — Z7952 Long term (current) use of systemic steroids: Secondary | ICD-10-CM | POA: Diagnosis not present

## 2017-06-26 DIAGNOSIS — Z792 Long term (current) use of antibiotics: Secondary | ICD-10-CM | POA: Diagnosis not present

## 2017-06-26 DIAGNOSIS — T8612 Kidney transplant failure: Secondary | ICD-10-CM | POA: Diagnosis not present

## 2017-06-26 DIAGNOSIS — E785 Hyperlipidemia, unspecified: Secondary | ICD-10-CM | POA: Diagnosis not present

## 2017-06-26 DIAGNOSIS — Z8701 Personal history of pneumonia (recurrent): Secondary | ICD-10-CM | POA: Diagnosis not present

## 2017-06-26 DIAGNOSIS — Z79899 Other long term (current) drug therapy: Secondary | ICD-10-CM | POA: Diagnosis not present

## 2017-06-26 DIAGNOSIS — X58XXXS Exposure to other specified factors, sequela: Secondary | ICD-10-CM | POA: Diagnosis not present

## 2017-06-26 DIAGNOSIS — I5032 Chronic diastolic (congestive) heart failure: Secondary | ICD-10-CM | POA: Diagnosis not present

## 2017-06-26 DIAGNOSIS — Z992 Dependence on renal dialysis: Secondary | ICD-10-CM | POA: Diagnosis not present

## 2017-06-26 DIAGNOSIS — D8989 Other specified disorders involving the immune mechanism, not elsewhere classified: Secondary | ICD-10-CM | POA: Diagnosis not present

## 2017-06-26 DIAGNOSIS — E1122 Type 2 diabetes mellitus with diabetic chronic kidney disease: Secondary | ICD-10-CM | POA: Diagnosis not present

## 2017-06-26 DIAGNOSIS — Z794 Long term (current) use of insulin: Secondary | ICD-10-CM | POA: Diagnosis not present

## 2017-06-27 DIAGNOSIS — Z992 Dependence on renal dialysis: Secondary | ICD-10-CM | POA: Diagnosis not present

## 2017-06-27 DIAGNOSIS — N186 End stage renal disease: Secondary | ICD-10-CM | POA: Diagnosis not present

## 2017-06-29 DIAGNOSIS — Z992 Dependence on renal dialysis: Secondary | ICD-10-CM | POA: Diagnosis not present

## 2017-06-29 DIAGNOSIS — N186 End stage renal disease: Secondary | ICD-10-CM | POA: Diagnosis not present

## 2017-07-02 DIAGNOSIS — N186 End stage renal disease: Secondary | ICD-10-CM | POA: Diagnosis not present

## 2017-07-02 DIAGNOSIS — Z992 Dependence on renal dialysis: Secondary | ICD-10-CM | POA: Diagnosis not present

## 2017-07-03 DIAGNOSIS — Z992 Dependence on renal dialysis: Secondary | ICD-10-CM | POA: Diagnosis not present

## 2017-07-03 DIAGNOSIS — N186 End stage renal disease: Secondary | ICD-10-CM | POA: Diagnosis not present

## 2017-07-03 DIAGNOSIS — Z1231 Encounter for screening mammogram for malignant neoplasm of breast: Secondary | ICD-10-CM | POA: Diagnosis not present

## 2017-07-04 DIAGNOSIS — Z992 Dependence on renal dialysis: Secondary | ICD-10-CM | POA: Diagnosis not present

## 2017-07-04 DIAGNOSIS — N186 End stage renal disease: Secondary | ICD-10-CM | POA: Diagnosis not present

## 2017-07-05 DIAGNOSIS — Z01818 Encounter for other preprocedural examination: Secondary | ICD-10-CM | POA: Diagnosis not present

## 2017-07-06 DIAGNOSIS — Z992 Dependence on renal dialysis: Secondary | ICD-10-CM | POA: Diagnosis not present

## 2017-07-06 DIAGNOSIS — N186 End stage renal disease: Secondary | ICD-10-CM | POA: Diagnosis not present

## 2017-07-09 DIAGNOSIS — Z992 Dependence on renal dialysis: Secondary | ICD-10-CM | POA: Diagnosis not present

## 2017-07-09 DIAGNOSIS — N186 End stage renal disease: Secondary | ICD-10-CM | POA: Diagnosis not present

## 2017-07-11 DIAGNOSIS — Z992 Dependence on renal dialysis: Secondary | ICD-10-CM | POA: Diagnosis not present

## 2017-07-11 DIAGNOSIS — N186 End stage renal disease: Secondary | ICD-10-CM | POA: Diagnosis not present

## 2017-07-13 DIAGNOSIS — Z992 Dependence on renal dialysis: Secondary | ICD-10-CM | POA: Diagnosis not present

## 2017-07-13 DIAGNOSIS — N186 End stage renal disease: Secondary | ICD-10-CM | POA: Diagnosis not present

## 2017-07-16 DIAGNOSIS — Z992 Dependence on renal dialysis: Secondary | ICD-10-CM | POA: Diagnosis not present

## 2017-07-16 DIAGNOSIS — N186 End stage renal disease: Secondary | ICD-10-CM | POA: Diagnosis not present

## 2017-07-18 DIAGNOSIS — Z992 Dependence on renal dialysis: Secondary | ICD-10-CM | POA: Diagnosis not present

## 2017-07-18 DIAGNOSIS — N186 End stage renal disease: Secondary | ICD-10-CM | POA: Diagnosis not present

## 2017-07-20 DIAGNOSIS — N186 End stage renal disease: Secondary | ICD-10-CM | POA: Diagnosis not present

## 2017-07-20 DIAGNOSIS — Z992 Dependence on renal dialysis: Secondary | ICD-10-CM | POA: Diagnosis not present

## 2017-07-23 DIAGNOSIS — Z992 Dependence on renal dialysis: Secondary | ICD-10-CM | POA: Diagnosis not present

## 2017-07-23 DIAGNOSIS — N186 End stage renal disease: Secondary | ICD-10-CM | POA: Diagnosis not present

## 2017-07-25 DIAGNOSIS — N186 End stage renal disease: Secondary | ICD-10-CM | POA: Diagnosis not present

## 2017-07-25 DIAGNOSIS — Z992 Dependence on renal dialysis: Secondary | ICD-10-CM | POA: Diagnosis not present

## 2017-07-27 DIAGNOSIS — N186 End stage renal disease: Secondary | ICD-10-CM | POA: Diagnosis not present

## 2017-07-27 DIAGNOSIS — Z992 Dependence on renal dialysis: Secondary | ICD-10-CM | POA: Diagnosis not present

## 2017-07-30 DIAGNOSIS — Z992 Dependence on renal dialysis: Secondary | ICD-10-CM | POA: Diagnosis not present

## 2017-07-30 DIAGNOSIS — N186 End stage renal disease: Secondary | ICD-10-CM | POA: Diagnosis not present

## 2017-07-31 ENCOUNTER — Encounter: Payer: Self-pay | Admitting: Family Medicine

## 2017-07-31 ENCOUNTER — Ambulatory Visit: Payer: BLUE CROSS/BLUE SHIELD | Admitting: Family Medicine

## 2017-07-31 VITALS — BP 122/63 | HR 64 | Temp 97.9°F | Ht 68.0 in | Wt 140.0 lb

## 2017-07-31 DIAGNOSIS — J0100 Acute maxillary sinusitis, unspecified: Secondary | ICD-10-CM | POA: Diagnosis not present

## 2017-07-31 MED ORDER — AZITHROMYCIN 250 MG PO TABS: ORAL_TABLET | ORAL | 0 refills | Status: DC

## 2017-07-31 NOTE — Progress Notes (Signed)
Subjective: CC: URI PCP: Sharion Balloon, FNP [name] is a 62 y.o. female presenting to clinic today for:  1. URI Patient reports a 5-day history of deep, harsh cough that is nonproductive, sinus pressure and drainage.  She notes that she had nausea earlier today.  Denies any associated shortness of breath, wheeze, hemoptysis, vomiting, diarrhea.  She has been using Mucinex with little improvement in her symptoms.  She reports being treated for pneumonia earlier this year.  She notes that she tolerated the azithromycin without difficulty.  Medical history is significant for end-stage renal disease on hemodialysis and chronic diastolic heart failure.  No known history of COPD or asthma.   ROS: Per HPI  Allergies  Allergen Reactions  . Other Other (See Comments)    Bruises and tears skin, Paper tape tolerated   Past Medical History:  Diagnosis Date  . Anemia    of chronic disease  . Blood transfusion without reported diagnosis   . Cardiovascular disease    nonobstructive  . Carotid artery stenosis 2008  . CHF (congestive heart failure) ([place])   . Coronary artery disease   . Diabetes mellitus   . Dyslipidemia   . Edema of lower extremity    with hypo-albuminemia and profound protenuria  . Heart murmur   . Hypertension   . Mitral regurgitation   . Nephrotic syndrome   . Patent foramen ovale   . Pulmonary hypertension, moderate to severe ([place])   . Pulmonary nodule   . Tricuspid regurgitation   . Volume depletion, renal, due to output loss (renal deficit)     Current Outpatient Medications:  .  acetaminophen (TYLENOL) 500 MG tablet, Take 500 mg by mouth every 6 (six) hours as needed for moderate pain or headache. , Disp: , Rfl:  .  amLODipine (NORVASC) 10 MG tablet, TAKE 10 MG BY MOUTH NIGHTLY, Disp: , Rfl:  .  aspirin (ASPIR-LOW) 81 MG EC tablet, Take 81 mg by mouth daily. , Disp: , Rfl:  .  calcium gluconate 500 MG tablet, Take 1 tablet by mouth 3 (three) times  daily., Disp: , Rfl:  .  Dulaglutide (TRULICITY) 1.5 DG/6.4QI SOPN, Inject 1.5 mg into the skin once a week. (Patient taking differently: Inject 1.5 mg into the skin once a week. On Saturdays), Disp: 12 pen, Rfl: 1 .  insulin degludec (TRESIBA FLEXTOUCH) 100 UNIT/ML SOPN FlexTouch Pen, Inject 0.13 mLs (13 Units total) into the skin daily at 10 pm., Disp: 12 pen, Rfl: 2 .  insulin glargine (LANTUS) 100 UNIT/ML injection, Inject 10-15 Units into the skin See admin instructions. 10 units in the morning, 15 units in the evening , Disp: , Rfl:  .  isosorbide mononitrate (IMDUR) 60 MG 24 hr tablet, Take 1 tablet (60 mg total) by mouth daily., Disp: 90 tablet, Rfl: 1 .  lidocaine-prilocaine (EMLA) cream, Apply 1 application topically as needed (for dialysis). , Disp: , Rfl: 0 .  metoprolol tartrate (LOPRESSOR) 25 MG tablet, Take 12.5 mg by mouth 2 (two) times daily. , Disp: , Rfl: 1 .  ondansetron (ZOFRAN-ODT) 4 MG disintegrating tablet, Take 1 tablet (4 mg total) by mouth every 8 (eight) hours as needed for nausea or vomiting., Disp: 20 tablet, Rfl: 2 .  predniSONE (DELTASONE) 5 MG tablet, Take 1 tablet (5 mg total) by mouth daily with breakfast., Disp: 90 tablet, Rfl: 1 .  PROGRAF 0.5 MG capsule, Take 0.5-1 mg by mouth See admin instructions. Take 1 mg by mouth  in the morning and take 0.5 mg by mouth at bedtime, Disp: , Rfl: 0 .  rosuvastatin (CRESTOR) 20 MG tablet, Take 1 tablet (20 mg total) by mouth daily., Disp: 90 tablet, Rfl: 1 .  sevelamer carbonate (RENVELA) 800 MG tablet, Take 3200 mg by mouth 3 times daily with meals and take 1600 mg by mouth with snacks, Disp: , Rfl:  Social History   Socioeconomic History  . Marital status: Married    Spouse name: Not on file  . Number of children: Not on file  . Years of education: Not on file  . Highest education level: Not on file  Occupational History  . Not on file  Social Needs  . Financial resource strain: Not on file  . Food insecurity:     Worry: Not on file    Inability: Not on file  . Transportation needs:    Medical: Not on file    Non-medical: Not on file  Tobacco Use  . Smoking status: Never Smoker  . Smokeless tobacco: Never Used  Substance and Sexual Activity  . Alcohol use: No  . Drug use: No  . Sexual activity: Never  Lifestyle  . Physical activity:    Days per week: Not on file    Minutes per session: Not on file  . Stress: Not on file  Relationships  . Social connections:    Talks on phone: Not on file    Gets together: Not on file    Attends religious service: Not on file    Active member of club or organization: Not on file    Attends meetings of clubs or organizations: Not on file    Relationship status: Not on file  . Intimate partner violence:    Fear of current or ex partner: Not on file    Emotionally abused: Not on file    Physically abused: Not on file    Forced sexual activity: Not on file  Other Topics Concern  . Not on file  Social History Narrative  . Not on file   Family History  Problem Relation Age of Onset  . Kidney disease Mother   . Diabetes Mother   . Diabetes Father   . Heart disease Father     Objective: Office vital signs reviewed. BP 122/63   Pulse 64   Temp 97.9 F (36.6 C) (Oral)   Ht 5\' 8"  (1.727 m)   Wt 140 lb (63.5 kg)   SpO2 99%   BMI 21.29 kg/m   Physical Examination:  General: Awake, alert, chronically ill appearing female, No acute distress HEENT: Normal    Neck: No masses palpated. No lymphadenopathy; no JVD    Ears: Tympanic membranes intact, normal light reflex, no erythema, no bulging    Eyes: PERRLA, extraocular membranes intact, sclera white    Nose: nasal turbinates moist, opaque nasal discharge    Throat: moist mucus membranes, no erythema, no tonsillar exudate.  Airway is patent Cardio: regular rate and rhythm, S1S2 heard, soft systolic murmur appreciated Pulm: Globally decreased breath sounds, no wheezes, rhonchi or rales; normal work  of breathing on room air Extremities: warm, well perfused, No edema, cyanosis or clubbing; +2 pulses bilaterally  Assessment/ Plan: 62 y.o. female   1. Acute non-recurrent maxillary sinusitis Patient is afebrile and nontoxic-appearing.  She has normal oxygen saturation and work of breathing on room air.  Exam was notable for decreased breath sounds throughout.  Given her multiple comorbidities and persistent symptoms that are  refractory to over-the-counter treatments, we discussed consideration for antibiotics.  I reviewed her last EKG which did demonstrate a borderline prolonged QTC.  She has since tolerated azithromycin.  Z-Pak was prescribed.  Home care instructions were reviewed.  Reasons for return and emergent evaluation the emergency department discussed.  Patient was good understanding will follow-up as needed.    Meds ordered this encounter  Medications  . azithromycin (ZITHROMAX Z-PAK) 250 MG tablet    Sig: As directed    Dispense:  6 tablet    Refill:  0     Sue Portillo Windell Moulding, DO [place] [phone]

## 2017-07-31 NOTE — Patient Instructions (Signed)

## 2017-08-01 DIAGNOSIS — N186 End stage renal disease: Secondary | ICD-10-CM | POA: Diagnosis not present

## 2017-08-01 DIAGNOSIS — Z992 Dependence on renal dialysis: Secondary | ICD-10-CM | POA: Diagnosis not present

## 2017-08-03 DIAGNOSIS — N186 End stage renal disease: Secondary | ICD-10-CM | POA: Diagnosis not present

## 2017-08-03 DIAGNOSIS — Z992 Dependence on renal dialysis: Secondary | ICD-10-CM | POA: Diagnosis not present

## 2017-08-06 DIAGNOSIS — Z992 Dependence on renal dialysis: Secondary | ICD-10-CM | POA: Diagnosis not present

## 2017-08-06 DIAGNOSIS — N186 End stage renal disease: Secondary | ICD-10-CM | POA: Diagnosis not present

## 2017-08-08 DIAGNOSIS — Z992 Dependence on renal dialysis: Secondary | ICD-10-CM | POA: Diagnosis not present

## 2017-08-08 DIAGNOSIS — N186 End stage renal disease: Secondary | ICD-10-CM | POA: Diagnosis not present

## 2017-08-10 DIAGNOSIS — Z992 Dependence on renal dialysis: Secondary | ICD-10-CM | POA: Diagnosis not present

## 2017-08-10 DIAGNOSIS — K047 Periapical abscess without sinus: Secondary | ICD-10-CM | POA: Diagnosis not present

## 2017-08-10 DIAGNOSIS — K0889 Other specified disorders of teeth and supporting structures: Secondary | ICD-10-CM | POA: Diagnosis not present

## 2017-08-10 DIAGNOSIS — N186 End stage renal disease: Secondary | ICD-10-CM | POA: Diagnosis not present

## 2017-08-12 ENCOUNTER — Other Ambulatory Visit: Payer: Self-pay | Admitting: Family

## 2017-08-13 DIAGNOSIS — Z992 Dependence on renal dialysis: Secondary | ICD-10-CM | POA: Diagnosis not present

## 2017-08-13 DIAGNOSIS — N186 End stage renal disease: Secondary | ICD-10-CM | POA: Diagnosis not present

## 2017-08-15 DIAGNOSIS — N186 End stage renal disease: Secondary | ICD-10-CM | POA: Diagnosis not present

## 2017-08-15 DIAGNOSIS — Z992 Dependence on renal dialysis: Secondary | ICD-10-CM | POA: Diagnosis not present

## 2017-08-17 DIAGNOSIS — Z992 Dependence on renal dialysis: Secondary | ICD-10-CM | POA: Diagnosis not present

## 2017-08-17 DIAGNOSIS — N186 End stage renal disease: Secondary | ICD-10-CM | POA: Diagnosis not present

## 2017-08-20 DIAGNOSIS — N186 End stage renal disease: Secondary | ICD-10-CM | POA: Diagnosis not present

## 2017-08-20 DIAGNOSIS — Z992 Dependence on renal dialysis: Secondary | ICD-10-CM | POA: Diagnosis not present

## 2017-08-22 DIAGNOSIS — Z992 Dependence on renal dialysis: Secondary | ICD-10-CM | POA: Diagnosis not present

## 2017-08-22 DIAGNOSIS — N186 End stage renal disease: Secondary | ICD-10-CM | POA: Diagnosis not present

## 2017-08-24 DIAGNOSIS — N186 End stage renal disease: Secondary | ICD-10-CM | POA: Diagnosis not present

## 2017-08-24 DIAGNOSIS — Z992 Dependence on renal dialysis: Secondary | ICD-10-CM | POA: Diagnosis not present

## 2017-08-27 DIAGNOSIS — N186 End stage renal disease: Secondary | ICD-10-CM | POA: Diagnosis not present

## 2017-08-27 DIAGNOSIS — Z992 Dependence on renal dialysis: Secondary | ICD-10-CM | POA: Diagnosis not present

## 2017-08-28 ENCOUNTER — Other Ambulatory Visit: Payer: Self-pay

## 2017-08-28 DIAGNOSIS — Z992 Dependence on renal dialysis: Principal | ICD-10-CM

## 2017-08-28 DIAGNOSIS — N186 End stage renal disease: Secondary | ICD-10-CM

## 2017-08-28 DIAGNOSIS — T82510A Breakdown (mechanical) of surgically created arteriovenous fistula, initial encounter: Secondary | ICD-10-CM

## 2017-08-29 DIAGNOSIS — N186 End stage renal disease: Secondary | ICD-10-CM | POA: Diagnosis not present

## 2017-08-29 DIAGNOSIS — Z992 Dependence on renal dialysis: Secondary | ICD-10-CM | POA: Diagnosis not present

## 2017-08-31 DIAGNOSIS — Z992 Dependence on renal dialysis: Secondary | ICD-10-CM | POA: Diagnosis not present

## 2017-08-31 DIAGNOSIS — N186 End stage renal disease: Secondary | ICD-10-CM | POA: Diagnosis not present

## 2017-09-02 DIAGNOSIS — N186 End stage renal disease: Secondary | ICD-10-CM | POA: Diagnosis not present

## 2017-09-02 DIAGNOSIS — Z992 Dependence on renal dialysis: Secondary | ICD-10-CM | POA: Diagnosis not present

## 2017-09-03 DIAGNOSIS — N186 End stage renal disease: Secondary | ICD-10-CM | POA: Diagnosis not present

## 2017-09-03 DIAGNOSIS — Z992 Dependence on renal dialysis: Secondary | ICD-10-CM | POA: Diagnosis not present

## 2017-09-05 DIAGNOSIS — Z992 Dependence on renal dialysis: Secondary | ICD-10-CM | POA: Diagnosis not present

## 2017-09-05 DIAGNOSIS — N186 End stage renal disease: Secondary | ICD-10-CM | POA: Diagnosis not present

## 2017-09-07 DIAGNOSIS — N186 End stage renal disease: Secondary | ICD-10-CM | POA: Diagnosis not present

## 2017-09-07 DIAGNOSIS — Z992 Dependence on renal dialysis: Secondary | ICD-10-CM | POA: Diagnosis not present

## 2017-09-09 ENCOUNTER — Other Ambulatory Visit: Payer: Self-pay | Admitting: Family

## 2017-09-10 DIAGNOSIS — Z992 Dependence on renal dialysis: Secondary | ICD-10-CM | POA: Diagnosis not present

## 2017-09-10 DIAGNOSIS — N186 End stage renal disease: Secondary | ICD-10-CM | POA: Diagnosis not present

## 2017-09-11 ENCOUNTER — Other Ambulatory Visit: Payer: Self-pay

## 2017-09-11 ENCOUNTER — Other Ambulatory Visit: Payer: Self-pay | Admitting: *Deleted

## 2017-09-11 ENCOUNTER — Other Ambulatory Visit: Payer: Self-pay | Admitting: Vascular Surgery

## 2017-09-11 ENCOUNTER — Encounter: Payer: Self-pay | Admitting: Vascular Surgery

## 2017-09-11 ENCOUNTER — Ambulatory Visit (INDEPENDENT_AMBULATORY_CARE_PROVIDER_SITE_OTHER): Payer: BLUE CROSS/BLUE SHIELD | Admitting: Vascular Surgery

## 2017-09-11 ENCOUNTER — Ambulatory Visit (HOSPITAL_COMMUNITY)
Admission: RE | Admit: 2017-09-11 | Discharge: 2017-09-11 | Disposition: A | Discharge status: Home / Self Care | Payer: BLUE CROSS/BLUE SHIELD | Source: Ambulatory Visit | Attending: Vascular Surgery | Admitting: Vascular Surgery

## 2017-09-11 ENCOUNTER — Encounter: Payer: Self-pay | Admitting: *Deleted

## 2017-09-11 VITALS — BP 128/68 | HR 54 | Temp 97.0°F | Resp 16 | Ht 68.0 in | Wt 142.0 lb

## 2017-09-11 DIAGNOSIS — Z992 Dependence on renal dialysis: Principal | ICD-10-CM

## 2017-09-11 DIAGNOSIS — N186 End stage renal disease: Secondary | ICD-10-CM

## 2017-09-11 DIAGNOSIS — X58XXXA Exposure to other specified factors, initial encounter: Secondary | ICD-10-CM | POA: Diagnosis not present

## 2017-09-11 DIAGNOSIS — T82510A Breakdown (mechanical) of surgically created arteriovenous fistula, initial encounter: Secondary | ICD-10-CM

## 2017-09-11 NOTE — H&P (View-Only) (Signed)
[facility] [place]  Patient name: [name] MRN: [id number] DOB: December 31, 1955 Sex: female  REASON FOR VISIT: Evaluation of poorly functioning left radiocephalic AV fistula  HPI: [name] is a 62 y.o. female here today for evaluation of her left radiocephalic AV fistula.  This is patent and she is using it for hemodialysis currently.  She reports no specific difficulty with access.  She is having poor flow characteristics with her access.  She did undergo dilatation of this with fistulogram with Dr. Scot Dock in January 2019.  Did have improved flow and now has had recurrent issue regarding flow Past Medical History:  Diagnosis Date  . Anemia    of chronic disease  . Blood transfusion without reported diagnosis   . Cardiovascular disease    nonobstructive  . Carotid artery stenosis 2008  . CHF (congestive heart failure) ([place])   . Coronary artery disease   . Diabetes mellitus   . Dyslipidemia   . Edema of lower extremity    with hypo-albuminemia and profound protenuria  . Heart murmur   . Hypertension   . Mitral regurgitation   . Nephrotic syndrome   . Patent foramen ovale   . Pulmonary hypertension, moderate to severe ([place])   . Pulmonary nodule   . Tricuspid regurgitation   . Volume depletion, renal, due to output loss (renal deficit)     Family History  Problem Relation Age of Onset  . Kidney disease Mother   . Diabetes Mother   . Diabetes Father   . Heart disease Father     SOCIAL HISTORY: Social History   Tobacco Use  . Smoking status: Never Smoker  . Smokeless tobacco: Never Used  Substance Use Topics  . Alcohol use: No    Allergies  Allergen Reactions  . Other Other (See Comments)    Tape Bruises and tears skin, Paper tape tolerated    Current Outpatient Medications  Medication Sig Dispense Refill  . acetaminophen (TYLENOL) 500 MG tablet Take 500 mg by mouth every 6 (six) hours as  needed for moderate pain or headache.     Marland Kitchen amLODipine (NORVASC) 10 MG tablet TAKE 10 MG BY MOUTH NIGHTLY    . aspirin (ASPIR-LOW) 81 MG EC tablet Take 81 mg by mouth daily.     Marland Kitchen azithromycin (ZITHROMAX Z-PAK) 250 MG tablet As directed 6 tablet 0  . calcium gluconate 500 MG tablet Take 1 tablet by mouth 3 (three) times daily.    . insulin degludec (TRESIBA FLEXTOUCH) 100 UNIT/ML SOPN FlexTouch Pen Inject 0.13 mLs (13 Units total) into the skin daily at 10 pm. 12 pen 2  . isosorbide mononitrate (IMDUR) 60 MG 24 hr tablet Take 1 tablet (60 mg total) by mouth daily. 90 tablet 1  . lidocaine-prilocaine (EMLA) cream Apply 1 application topically as needed (for dialysis).   0  . metoprolol tartrate (LOPRESSOR) 25 MG tablet Take 12.5 mg by mouth 2 (two) times daily.   1  . ondansetron (ZOFRAN-ODT) 4 MG disintegrating tablet Take 1 tablet (4 mg total) by mouth every 8 (eight) hours as needed for nausea or vomiting. 20 tablet 2  . predniSONE (DELTASONE) 5 MG tablet Take 1 tablet (5 mg total) by mouth daily with breakfast. 90 tablet 1  . PROGRAF 0.5 MG capsule Take 0.5-1 mg by mouth See admin instructions. Take 1 mg by mouth in the morning and take 0.5 mg by mouth at bedtime  0  . rosuvastatin (  CRESTOR) 20 MG tablet Take 1 tablet (20 mg total) by mouth daily. 90 tablet 1  . sevelamer carbonate (RENVELA) 800 MG tablet Take 3200 mg by mouth 3 times daily with meals and take 1600 mg by mouth with snacks    . TRULICITY 1.5 FB/3.7HK SOPN INJECT 1.5 MG INTO THE SKIN ONCE A WEEK 0.5 mL 1   No current facility-administered medications for this visit.     REVIEW OF SYSTEMS:  [X]  denotes positive finding, [ ]  denotes negative finding Cardiac  Comments:  Chest pain or chest pressure:    Shortness of breath upon exertion:    Short of breath when lying flat:    Irregular heart rhythm:        Vascular    Pain in calf, thigh, or hip brought on by ambulation:    Pain in feet at night that wakes you up from your  sleep:     Blood clot in your veins:    Leg swelling:           PHYSICAL EXAM: Vitals:   09/11/17 1542  BP: 128/68  Pulse: (!) 54  Resp: 16  Temp: (!) 97 F (36.1 C)  TempSrc: Oral  SpO2: 98%  Weight: 142 lb (64.4 kg)  Height: 5\' 8"  (1.727 m)    GENERAL: The patient is a well-nourished female, in no acute distress. The vital signs are documented above. CARDIOVASCULAR: Her left radiocephalic fistula is patent.  There is no pulsatile nature. PULMONARY: There is good air exchange  MUSCULOSKELETAL: There are no major deformities or cyanosis. NEUROLOGIC: No focal weakness or paresthesias are detected. SKIN: There are no ulcers or rashes noted. PSYCHIATRIC: The patient has a normal affect.  DATA:  She underwent duplex of her fistula.  This does show elevated velocities near the radiocephalic anastomosis.  MEDICAL ISSUES: Patent left radiocephalic fistula with poor function.  Will plan fistulogram on a nondialysis day at [facility].  Hopefully can be amenable to endovascular techniques for improve flow.  We will schedule this at her earliest [place]. Early, MD [facility] Tel [phone] Pager [phone]

## 2017-09-11 NOTE — Progress Notes (Signed)
[facility] [place]  Patient name: [name] MRN: [id number] DOB: January 24, 1956 Sex: female  REASON FOR VISIT: Evaluation of poorly functioning left radiocephalic AV fistula  HPI: [name] is a 62 y.o. female here today for evaluation of her left radiocephalic AV fistula.  This is patent and she is using it for hemodialysis currently.  She reports no specific difficulty with access.  She is having poor flow characteristics with her access.  She did undergo dilatation of this with fistulogram with Dr. Scot Dock in January 2019.  Did have improved flow and now has had recurrent issue regarding flow Past Medical History:  Diagnosis Date  . Anemia    of chronic disease  . Blood transfusion without reported diagnosis   . Cardiovascular disease    nonobstructive  . Carotid artery stenosis 2008  . CHF (congestive heart failure) ([place])   . Coronary artery disease   . Diabetes mellitus   . Dyslipidemia   . Edema of lower extremity    with hypo-albuminemia and profound protenuria  . Heart murmur   . Hypertension   . Mitral regurgitation   . Nephrotic syndrome   . Patent foramen ovale   . Pulmonary hypertension, moderate to severe ([place])   . Pulmonary nodule   . Tricuspid regurgitation   . Volume depletion, renal, due to output loss (renal deficit)     Family History  Problem Relation Age of Onset  . Kidney disease Mother   . Diabetes Mother   . Diabetes Father   . Heart disease Father     SOCIAL HISTORY: Social History   Tobacco Use  . Smoking status: Never Smoker  . Smokeless tobacco: Never Used  Substance Use Topics  . Alcohol use: No    Allergies  Allergen Reactions  . Other Other (See Comments)    Tape Bruises and tears skin, Paper tape tolerated    Current Outpatient Medications  Medication Sig Dispense Refill  . acetaminophen (TYLENOL) 500 MG tablet Take 500 mg by mouth every 6 (six) hours as  needed for moderate pain or headache.     Marland Kitchen amLODipine (NORVASC) 10 MG tablet TAKE 10 MG BY MOUTH NIGHTLY    . aspirin (ASPIR-LOW) 81 MG EC tablet Take 81 mg by mouth daily.     Marland Kitchen azithromycin (ZITHROMAX Z-PAK) 250 MG tablet As directed 6 tablet 0  . calcium gluconate 500 MG tablet Take 1 tablet by mouth 3 (three) times daily.    . insulin degludec (TRESIBA FLEXTOUCH) 100 UNIT/ML SOPN FlexTouch Pen Inject 0.13 mLs (13 Units total) into the skin daily at 10 pm. 12 pen 2  . isosorbide mononitrate (IMDUR) 60 MG 24 hr tablet Take 1 tablet (60 mg total) by mouth daily. 90 tablet 1  . lidocaine-prilocaine (EMLA) cream Apply 1 application topically as needed (for dialysis).   0  . metoprolol tartrate (LOPRESSOR) 25 MG tablet Take 12.5 mg by mouth 2 (two) times daily.   1  . ondansetron (ZOFRAN-ODT) 4 MG disintegrating tablet Take 1 tablet (4 mg total) by mouth every 8 (eight) hours as needed for nausea or vomiting. 20 tablet 2  . predniSONE (DELTASONE) 5 MG tablet Take 1 tablet (5 mg total) by mouth daily with breakfast. 90 tablet 1  . PROGRAF 0.5 MG capsule Take 0.5-1 mg by mouth See admin instructions. Take 1 mg by mouth in the morning and take 0.5 mg by mouth at bedtime  0  . rosuvastatin (  CRESTOR) 20 MG tablet Take 1 tablet (20 mg total) by mouth daily. 90 tablet 1  . sevelamer carbonate (RENVELA) 800 MG tablet Take 3200 mg by mouth 3 times daily with meals and take 1600 mg by mouth with snacks    . TRULICITY 1.5 PV/9.4IA SOPN INJECT 1.5 MG INTO THE SKIN ONCE A WEEK 0.5 mL 1   No current facility-administered medications for this visit.     REVIEW OF SYSTEMS:  [X]  denotes positive finding, [ ]  denotes negative finding Cardiac  Comments:  Chest pain or chest pressure:    Shortness of breath upon exertion:    Short of breath when lying flat:    Irregular heart rhythm:        Vascular    Pain in calf, thigh, or hip brought on by ambulation:    Pain in feet at night that wakes you up from your  sleep:     Blood clot in your veins:    Leg swelling:           PHYSICAL EXAM: Vitals:   09/11/17 1542  BP: 128/68  Pulse: (!) 54  Resp: 16  Temp: (!) 97 F (36.1 C)  TempSrc: Oral  SpO2: 98%  Weight: 142 lb (64.4 kg)  Height: 5\' 8"  (1.727 m)    GENERAL: The patient is a well-nourished female, in no acute distress. The vital signs are documented above. CARDIOVASCULAR: Her left radiocephalic fistula is patent.  There is no pulsatile nature. PULMONARY: There is good air exchange  MUSCULOSKELETAL: There are no major deformities or cyanosis. NEUROLOGIC: No focal weakness or paresthesias are detected. SKIN: There are no ulcers or rashes noted. PSYCHIATRIC: The patient has a normal affect.  DATA:  She underwent duplex of her fistula.  This does show elevated velocities near the radiocephalic anastomosis.  MEDICAL ISSUES: Patent left radiocephalic fistula with poor function.  Will plan fistulogram on a nondialysis day at [facility].  Hopefully can be amenable to endovascular techniques for improve flow.  We will schedule this at her earliest [place]. Juergen Hardenbrook, MD [facility] Tel [phone] Pager [phone]

## 2017-09-12 DIAGNOSIS — N186 End stage renal disease: Secondary | ICD-10-CM | POA: Diagnosis not present

## 2017-09-12 DIAGNOSIS — Z992 Dependence on renal dialysis: Secondary | ICD-10-CM | POA: Diagnosis not present

## 2017-09-14 DIAGNOSIS — N186 End stage renal disease: Secondary | ICD-10-CM | POA: Diagnosis not present

## 2017-09-14 DIAGNOSIS — Z992 Dependence on renal dialysis: Secondary | ICD-10-CM | POA: Diagnosis not present

## 2017-09-17 DIAGNOSIS — Z992 Dependence on renal dialysis: Secondary | ICD-10-CM | POA: Diagnosis not present

## 2017-09-17 DIAGNOSIS — N186 End stage renal disease: Secondary | ICD-10-CM | POA: Diagnosis not present

## 2017-09-18 ENCOUNTER — Ambulatory Visit (HOSPITAL_COMMUNITY)
Admission: RE | Admit: 2017-09-18 | Discharge: 2017-09-18 | Disposition: A | Discharge status: Home / Self Care | Payer: BLUE CROSS/BLUE SHIELD | Source: Ambulatory Visit | Attending: Surgery | Admitting: Surgery

## 2017-09-18 ENCOUNTER — Encounter (HOSPITAL_COMMUNITY)
Admission: RE | Disposition: A | Discharge status: Home / Self Care | Payer: Self-pay | Source: Ambulatory Visit | Attending: Surgery

## 2017-09-18 DIAGNOSIS — I081 Rheumatic disorders of both mitral and tricuspid valves: Secondary | ICD-10-CM | POA: Diagnosis not present

## 2017-09-18 DIAGNOSIS — E1122 Type 2 diabetes mellitus with diabetic chronic kidney disease: Secondary | ICD-10-CM | POA: Insufficient documentation

## 2017-09-18 DIAGNOSIS — D631 Anemia in chronic kidney disease: Secondary | ICD-10-CM | POA: Insufficient documentation

## 2017-09-18 DIAGNOSIS — I132 Hypertensive heart and chronic kidney disease with heart failure and with stage 5 chronic kidney disease, or end stage renal disease: Secondary | ICD-10-CM | POA: Diagnosis not present

## 2017-09-18 DIAGNOSIS — I509 Heart failure, unspecified: Secondary | ICD-10-CM | POA: Diagnosis not present

## 2017-09-18 DIAGNOSIS — I2510 Atherosclerotic heart disease of native coronary artery without angina pectoris: Secondary | ICD-10-CM | POA: Diagnosis not present

## 2017-09-18 DIAGNOSIS — Z794 Long term (current) use of insulin: Secondary | ICD-10-CM | POA: Insufficient documentation

## 2017-09-18 DIAGNOSIS — Z940 Kidney transplant status: Secondary | ICD-10-CM | POA: Insufficient documentation

## 2017-09-18 DIAGNOSIS — Z7982 Long term (current) use of aspirin: Secondary | ICD-10-CM | POA: Diagnosis not present

## 2017-09-18 DIAGNOSIS — Z7952 Long term (current) use of systemic steroids: Secondary | ICD-10-CM | POA: Insufficient documentation

## 2017-09-18 DIAGNOSIS — N186 End stage renal disease: Secondary | ICD-10-CM | POA: Insufficient documentation

## 2017-09-18 DIAGNOSIS — I6529 Occlusion and stenosis of unspecified carotid artery: Secondary | ICD-10-CM | POA: Insufficient documentation

## 2017-09-18 DIAGNOSIS — Y832 Surgical operation with anastomosis, bypass or graft as the cause of abnormal reaction of the patient, or of later complication, without mention of misadventure at the time of the procedure: Secondary | ICD-10-CM | POA: Insufficient documentation

## 2017-09-18 DIAGNOSIS — I272 Pulmonary hypertension, unspecified: Secondary | ICD-10-CM | POA: Diagnosis not present

## 2017-09-18 DIAGNOSIS — Z992 Dependence on renal dialysis: Secondary | ICD-10-CM | POA: Diagnosis not present

## 2017-09-18 DIAGNOSIS — E875 Hyperkalemia: Secondary | ICD-10-CM | POA: Diagnosis not present

## 2017-09-18 DIAGNOSIS — E785 Hyperlipidemia, unspecified: Secondary | ICD-10-CM | POA: Diagnosis not present

## 2017-09-18 DIAGNOSIS — T82858A Stenosis of vascular prosthetic devices, implants and grafts, initial encounter: Secondary | ICD-10-CM | POA: Diagnosis not present

## 2017-09-18 DIAGNOSIS — T82898A Other specified complication of vascular prosthetic devices, implants and grafts, initial encounter: Secondary | ICD-10-CM | POA: Diagnosis not present

## 2017-09-18 HISTORY — PX: A/V FISTULAGRAM: CATH118298

## 2017-09-18 HISTORY — PX: PERIPHERAL VASCULAR BALLOON ANGIOPLASTY: CATH118281

## 2017-09-18 LAB — POCT I-STAT, CHEM 8
BUN: 68 mg/dL — AB (ref 8–23)
BUN: 72 mg/dL — ABNORMAL HIGH (ref 8–23)
Calcium, Ion: 1.11 mmol/L — ABNORMAL LOW (ref 1.15–1.40)
Calcium, Ion: 1.06 mmol/L — ABNORMAL LOW (ref 1.15–1.40)
Chloride: 104 mmol/L (ref 98–111)
Chloride: 106 mmol/L (ref 98–111)
Creatinine, Ser: 9.4 mg/dL — ABNORMAL HIGH (ref 0.44–1.00)
Creatinine, Ser: 9.8 mg/dL — ABNORMAL HIGH (ref 0.44–1.00)
Glucose, Bld: 85 mg/dL (ref 70–99)
Glucose, Bld: 197 mg/dL — ABNORMAL HIGH (ref 70–99)
HEMATOCRIT: 36 % (ref 36.0–46.0)
HEMATOCRIT: 44 % (ref 36.0–46.0)
HEMOGLOBIN: 15 g/dL (ref 12.0–15.0)
Hemoglobin: 12.2 g/dL (ref 12.0–15.0)
POTASSIUM: 6.3 mmol/L — AB (ref 3.5–5.1)
POTASSIUM: 6.9 mmol/L — AB (ref 3.5–5.1)
SODIUM: 138 mmol/L (ref 135–145)
Sodium: 139 mmol/L (ref 135–145)
TCO2: 27 mmol/L (ref 22–32)
TCO2: 25 mmol/L (ref 22–32)

## 2017-09-18 LAB — POTASSIUM
Potassium: 6.6 mmol/L (ref 3.5–5.1)
Potassium: 6.9 mmol/L (ref 3.5–5.1)
Potassium: 3.5 mmol/L (ref 3.5–5.1)

## 2017-09-18 LAB — GLUCOSE, CAPILLARY: GLUCOSE-CAPILLARY: 84 mg/dL (ref 70–99)

## 2017-09-18 SURGERY — A/V FISTULAGRAM
Anesthesia: LOCAL | Laterality: Left

## 2017-09-18 MED ORDER — PENTAFLUOROPROP-TETRAFLUOROETH EX AERO: 1.0000 "application " | INHALATION_SPRAY | CUTANEOUS | Status: DC | PRN

## 2017-09-18 MED ORDER — MIDAZOLAM HCL 2 MG/2ML IJ SOLN
INTRAMUSCULAR | Status: DC | PRN
  Administered 2017-09-18: 1 mg via INTRAVENOUS

## 2017-09-18 MED ORDER — SODIUM POLYSTYRENE SULFONATE 15 GM/60ML PO SUSP
60.0000 g | ORAL | Status: AC
  Administered 2017-09-18: 60 g via ORAL
  Filled 2017-09-18: qty 240

## 2017-09-18 MED ORDER — ALTEPLASE 2 MG IJ SOLR: 2.0000 mg | Freq: Once | INTRAMUSCULAR | Status: DC | PRN

## 2017-09-18 MED ORDER — OXYCODONE-ACETAMINOPHEN 5-325 MG PO TABS
ORAL_TABLET | ORAL | Status: AC
  Filled 2017-09-18: qty 1

## 2017-09-18 MED ORDER — FENTANYL CITRATE (PF) 100 MCG/2ML IJ SOLN
INTRAMUSCULAR | Status: AC
  Filled 2017-09-18: qty 2

## 2017-09-18 MED ORDER — LIDOCAINE-PRILOCAINE 2.5-2.5 % EX CREA: 1.0000 "application " | TOPICAL_CREAM | CUTANEOUS | Status: DC | PRN

## 2017-09-18 MED ORDER — OXYCODONE HCL 5 MG PO TABS
ORAL_TABLET | ORAL | Status: AC
  Filled 2017-09-18: qty 2

## 2017-09-18 MED ORDER — HEPARIN (PORCINE) IN NACL 1000-0.9 UT/500ML-% IV SOLN
INTRAVENOUS | Status: DC | PRN
  Administered 2017-09-18: 500 mL

## 2017-09-18 MED ORDER — OXYCODONE-ACETAMINOPHEN 5-325 MG PO TABS
ORAL_TABLET | ORAL | Status: AC
  Administered 2017-09-18: 2 via ORAL
  Filled 2017-09-18: qty 1

## 2017-09-18 MED ORDER — HEPARIN (PORCINE) IN NACL 1000-0.9 UT/500ML-% IV SOLN
INTRAVENOUS | Status: AC
  Filled 2017-09-18: qty 500

## 2017-09-18 MED ORDER — IODIXANOL 320 MG/ML IV SOLN
INTRAVENOUS | Status: DC | PRN
  Administered 2017-09-18: 40 mL via INTRAVENOUS

## 2017-09-18 MED ORDER — SODIUM CHLORIDE 0.9% FLUSH: 3.0000 mL | INTRAVENOUS | Status: DC | PRN

## 2017-09-18 MED ORDER — FENTANYL CITRATE (PF) 100 MCG/2ML IJ SOLN
INTRAMUSCULAR | Status: DC | PRN
  Administered 2017-09-18 (×3): 25 ug via INTRAVENOUS

## 2017-09-18 MED ORDER — MIDAZOLAM HCL 2 MG/2ML IJ SOLN
INTRAMUSCULAR | Status: AC
  Filled 2017-09-18: qty 2

## 2017-09-18 MED ORDER — SODIUM CHLORIDE 0.9 % IV SOLN
100.0000 mL | INTRAVENOUS | Status: DC | PRN
Start: 2017-09-18 — End: 2017-09-19

## 2017-09-18 MED ORDER — LIDOCAINE HCL (PF) 1 % IJ SOLN
5.0000 mL | INTRAMUSCULAR | Status: DC | PRN
Start: 2017-09-18 — End: 2017-09-19

## 2017-09-18 MED ORDER — OXYCODONE-ACETAMINOPHEN 5-325 MG PO TABS
2.0000 | ORAL_TABLET | Freq: Once | ORAL | Status: AC
  Administered 2017-09-18: 2 via ORAL

## 2017-09-18 MED ORDER — HEPARIN SODIUM (PORCINE) 1000 UNIT/ML IJ SOLN
INTRAMUSCULAR | Status: DC | PRN
  Administered 2017-09-18: 3000 [IU] via INTRAVENOUS

## 2017-09-18 MED ORDER — CHLORHEXIDINE GLUCONATE CLOTH 2 % EX PADS: 6.0000 | MEDICATED_PAD | Freq: Every day | CUTANEOUS | Status: DC

## 2017-09-18 MED ORDER — HEPARIN SODIUM (PORCINE) 1000 UNIT/ML DIALYSIS
1000.0000 [IU] | INTRAMUSCULAR | Status: DC | PRN
Start: 2017-09-18 — End: 2017-09-19

## 2017-09-18 MED ORDER — LIDOCAINE HCL (PF) 1 % IJ SOLN
INTRAMUSCULAR | Status: AC
  Filled 2017-09-18: qty 30

## 2017-09-18 MED ORDER — SODIUM CHLORIDE 0.9 % IV SOLN: 100.0000 mL | INTRAVENOUS | Status: DC | PRN

## 2017-09-18 SURGICAL SUPPLY — 17 items
BAG SNAP BAND KOVER 36X36 (MISCELLANEOUS) ×2 IMPLANT
BALLN LUTONIX DCB 5X40X130 (BALLOONS) ×2
BALLN STERLING OTW 6X40X135 (BALLOONS) ×2
BALLOON LUTONIX DCB 5X40X130 (BALLOONS) ×1 IMPLANT
BALLOON STERLING OTW 6X40X135 (BALLOONS) ×1 IMPLANT
COVER DOME SNAP 22 D (MISCELLANEOUS) ×2 IMPLANT
COVER PRB 48X5XTLSCP FOLD TPE (BAG) ×1 IMPLANT
COVER PROBE 5X48 (BAG) ×1
KIT ENCORE 26 ADVANTAGE (KITS) ×4 IMPLANT
KIT MICROPUNCTURE NIT STIFF (SHEATH) ×2 IMPLANT
PROTECTION STATION PRESSURIZED (MISCELLANEOUS) ×2
SHEATH PINNACLE R/O II 5F 6CM (SHEATH) ×2 IMPLANT
STATION PROTECTION PRESSURIZED (MISCELLANEOUS) ×1 IMPLANT
STOPCOCK MORSE 400PSI 3WAY (MISCELLANEOUS) ×2 IMPLANT
TRAY PV CATH (CUSTOM PROCEDURE TRAY) ×2 IMPLANT
TUBING CIL FLEX 10 FLL-RA (TUBING) ×2 IMPLANT
WIRE SPARTACORE .014X190CM (WIRE) ×2 IMPLANT

## 2017-09-18 NOTE — Progress Notes (Signed)
CRITICAL VALUE ALERT  Critical Value:  K+ 6.9 Date & Time Notied: 09/18/17 @ 1740  Provider Notified: Dr. Loni Muse. Powell  Orders Received/Actions taken: Repeat potassium level

## 2017-09-18 NOTE — Op Note (Signed)
    Patient name: [name] MRN: [id number] DOB: 1955/12/24 Sex: female  09/18/2017 Pre-operative Diagnosis: Poorly functioning left radiocephalic fistula Post-operative diagnosis:  Same Surgeon:  Annamarie Major Procedure Performed:  1.  Ultrasound-guided access, left radiocephalic fistula  2.  Fistulogram  3.  Angioplasty, left cephalic vein  4.  Conscious sedation (44 minutes)   Indications: The patient is having high flow rates on dialysis.  In January 2019 she underwent angioplasty using a 4 and 5 mm balloon.  She is here today for further evaluation  Procedure:  The patient was identified in the holding area and taken to room 8.  The patient was then placed supine on the table and prepped and draped in the usual sterile fashion.  A time out was called.  Conscious sedation was administered with use of IV fentanyl and Versed under continuous physician and nurse monitoring.  Heart rate, blood pressure, and oxygen saturation were continuously monitored.  Ultrasound was used to evaluate the fistula.  The vein was patent and compressible.  A digital ultrasound image was acquired.  The fistula was then accessed under ultrasound guidance using a micropuncture needle.  An 018 wire was then asvanced without resistance and a micropuncture sheath was placed.  Contrast injections were then performed through the sheath.  Findings: No central venous stenosis.  There is spasm within the outflow vein in the fistula however no definitive stenosis is identified.  There does appear to be luminal narrowing of approximately 60% within the previously treated segment of the proximal cephalic vein.   Intervention: After the above images were acquired the decision was made to proceed with intervention.  Over an 035 wire, a 5 French sheath was placed.  3000 units of heparin was administered.  I then used a 014 [place] core wire to cross the arterial venous anastomosis.  I then selected a 5 x 40 Lutonix drug-coated  balloon angioplasty near the arteriovenous anastomosis.  This was taken to 12 atm for 2 minutes.  There did appear to be a small waist at the level of the anastomosis.  I therefore after confirming this with an arteriogram, elected to upsized to a 6 x 40 [place] balloon.  This was taken to 10 atm.  There was a persistent waist.  The patient was having a fair amount of discomfort and therefore I elected not to push forward with angioplasty due to concerns of a rupture of the fistula.  After this inflation was performed, the balloon was removed and a follow-up angiogram was performed which showed residual stenosis of less than 20%.  I elected to terminate the procedure at this point.  The sheath was removed and a pursestring suture was used for closure  Impression:  #1  No central venous stenosis  #2  The venous outflow tract of the upper arm shows spasm.  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  The stenosis was approximately 60%.  This was treated with a 5 x 40 drug-coated balloon and then a 6 x 40 balloon.  There was a residual waist.  I did not push the issue because of the patient's discomfort and concern to have a rupture of the vein.    Theotis Burrow, M.D. [facility] [place] Office: [phone] Pager:  [phone]

## 2017-09-18 NOTE — Consult Note (Signed)
Referring Provider: No ref. provider found Primary Care Physician:  Sharion Balloon, FNP Primary Nephrologist:  Dr. Tracey Harries  Reason for Consultation:  Hyperkalemia and End Stage Renal Disease   s/p Angioplasty Left radiocephalic fistula   HPI: Brief history this is a delightful lady that underwent elective angioplasty by Dr Trula Slade this afternoon. She has been ESRD on dialysis x 1 year [facility]. She dialyzes MWF. She has had poor function of her dialysis access. Her pre procedure potassium was over 6 and at 17.56 was at 6.9  She was unable to take kayexalate post op due to emesis. She feels hungry at the present and we were asked to dialyze patient emergently. Due to large number of dialysis patients to be scheduled this evening, it was felt that this was emergent and so a two hour dialysis was scheduled on zero K . After her treatment we shall allow her to go home if stable and the K is better and she can return for her regular outpatient dialysis in the morning tomorrow   Past Medical History:  Diagnosis Date  . Anemia    of chronic disease  . Blood transfusion without reported diagnosis   . Cardiovascular disease    nonobstructive  . Carotid artery stenosis 2008  . CHF (congestive heart failure) ([place])   . Coronary artery disease   . Diabetes mellitus   . Dyslipidemia   . Edema of lower extremity    with hypo-albuminemia and profound protenuria  . Heart murmur   . Hypertension   . Mitral regurgitation   . Nephrotic syndrome   . Patent foramen ovale   . Pulmonary hypertension, moderate to severe ([place])   . Pulmonary nodule   . Tricuspid regurgitation   . Volume depletion, renal, due to output loss (renal deficit)     Past Surgical History:  Procedure Laterality Date  . A/V FISTULAGRAM N/A 04/05/2017   Procedure: A/V FISTULAGRAM - Left Arm;  Surgeon: Angelia Mould, MD;  Location: [place] [facility];  Service: Cardiovascular;  Laterality: N/A;  . A/V SHUNT  INTERVENTION Left 04/05/2017   Procedure: A/V SHUNT INTERVENTION;  Surgeon: Angelia Mould, MD;  Location: [place] [facility];  Service: Cardiovascular;  Laterality: Left;  . CARDIAC CATHETERIZATION  2008  . IR REMOVAL TUN CV CATH W/O FL  11/02/2016  . 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 Authorizing Provider  acetaminophen (TYLENOL) 500 MG tablet Take 500 mg by mouth every 6 (six) hours as needed for moderate pain or headache.  04/06/16  Yes [provider]  amLODipine (NORVASC) 10 MG tablet TAKE 10 MG BY MOUTH NIGHTLY 10/24/16  Yes [provider]  aspirin (ASPIR-LOW) 81 MG EC tablet Take 81 mg by mouth daily.    Yes [provider]  insulin degludec (TRESIBA FLEXTOUCH) 100 UNIT/ML SOPN FlexTouch Pen Inject 0.13 mLs (13 Units total) into the skin daily at 10 pm. 02/23/17  Yes Hawks, Christy A, FNP  isosorbide mononitrate (IMDUR) 60 MG 24 hr tablet Take 1 tablet (60 mg total) by mouth daily. 04/03/17  Yes Hawks, Christy A, FNP  lidocaine-prilocaine (EMLA) cream Apply 1 application topically as needed (for dialysis).  09/08/16  Yes [provider]  metoprolol tartrate (LOPRESSOR) 25 MG tablet Take 12.5 mg by mouth 2 (two) times daily.  10/23/16  Yes [provider]  ondansetron (ZOFRAN-ODT) 4 MG disintegrating tablet Take 1 tablet (  4 mg total) by mouth every 8 (eight) hours as needed for nausea or vomiting. 04/03/17  Yes Hawks, Christy A, FNP  predniSONE (DELTASONE) 5 MG tablet Take 1 tablet (5 mg total) by mouth daily with breakfast. 04/03/17  Yes Hawks, Christy A, FNP  PROGRAF 0.5 MG capsule Take 0.5-1 mg by mouth See admin instructions. Take 1 mg by mouth in the morning and take 0.5 mg by mouth at bedtime 10/24/16  Yes [provider]  rosuvastatin (CRESTOR) 20 MG tablet Take 1 tablet (20 mg total) by mouth daily. 04/03/17  Yes Hawks, Christy A, FNP  sevelamer carbonate  (RENVELA) 800 MG tablet Take 3200 mg by mouth 3 times daily with meals and take 1600 mg by mouth with snacks 10/02/16  Yes [provider]  TRULICITY 1.5 GL/8.7FI SOPN INJECT 1.5 MG INTO THE SKIN ONCE A WEEK Patient taking differently: INJECT 1.5 MG INTO THE SKIN ONCE A WEEK ON SATURDAY 09/10/17  Yes Hawks, Theador Hawthorne, FNP    Current Facility-Administered Medications  Medication Dose Route Frequency Provider Last Rate Last Dose  . 0.9 %  sodium chloride infusion  100 mL Intravenous PRN Edrick Oh, MD      . 0.9 %  sodium chloride infusion  100 mL Intravenous PRN Edrick Oh, MD      . alteplase (CATHFLO ACTIVASE) injection 2 mg  2 mg Intracatheter Once PRN Edrick Oh, MD      . Derrill Memo ON 09/19/2017] Chlorhexidine Gluconate Cloth 2 % PADS 6 each  6 each Topical Q0600 Edrick Oh, MD      . heparin injection 1,000 Units  1,000 Units Dialysis PRN Edrick Oh, MD      . lidocaine (PF) (XYLOCAINE) 1 % injection 5 mL  5 mL Intradermal PRN Edrick Oh, MD      . lidocaine-prilocaine (EMLA) cream 1 application  1 application Topical PRN Edrick Oh, MD      . oxyCODONE-acetaminophen (PERCOCET/ROXICET) 5-325 MG per tablet           . pentafluoroprop-tetrafluoroeth (GEBAUERS) aerosol 1 application  1 application Topical PRN Edrick Oh, MD      . sodium chloride flush (NS) 0.9 % injection 3 mL  3 mL Intravenous PRN Serafina Mitchell, MD        Allergies as of 09/11/2017 - Review Complete 09/11/2017  Allergen Reaction Noted  . Other Other (See Comments) 03/05/2016    Family History  Problem Relation Age of Onset  . Kidney disease Mother   . Diabetes Mother   . Diabetes Father   . Heart disease Father     Social History   Socioeconomic History  . Marital status: Married    Spouse name: Not on file  . Number of children: Not on file  . Years of education: Not on file  . Highest education level: Not on file  Occupational History  . Not on file  Social Needs  . Financial  resource strain: Not on file  . Food insecurity:    Worry: Not on file    Inability: Not on file  . Transportation needs:    Medical: Not on file    Non-medical: Not on file  Tobacco Use  . Smoking status: Never Smoker  . Smokeless tobacco: Never Used  Substance and Sexual Activity  . Alcohol use: No  . Drug use: No  . Sexual activity: Never  Lifestyle  . Physical activity:    Days per week: Not on file  Minutes per session: Not on file  . Stress: Not on file  Relationships  . Social connections:    Talks on phone: Not on file    Gets together: Not on file    Attends religious service: Not on file    Active member of club or organization: Not on file    Attends meetings of clubs or organizations: Not on file    Relationship status: Not on file  . Intimate partner violence:    Fear of current or ex partner: Not on file    Emotionally abused: Not on file    Physically abused: Not on file    Forced sexual activity: Not on file  Other Topics Concern  . Not on file  Social History Narrative  . Not on file    Review of Systems: Gen: Denies any fever, chills, sweats, anorexia, fatigue, weakness, malaise, weight loss, and sleep disorder HEENT: No visual complaints, No history of Retinopathy. Normal external appearance No Epistaxis or Sore throat. No sinusitis.   CV: Denies chest pain, angina, palpitations, syncope, orthopnea, PND, peripheral edema, and claudication. Resp: Denies dyspnea at rest, dyspnea with exercise, cough, sputum, wheezing, coughing up blood, and pleurisy. GI:  +  vomiting blood, jaundice, and fecal incontinence.   Denies dysphagia or odynophagia. GU : Denies urinary burning, blood in urine, urinary frequency, urinary hesitancy, nocturnal urination, and urinary incontinence.  No renal calculi. MS: Denies joint pain, limitation of movement, and swelling, stiffness, low back pain, extremity pain. Denies muscle weakness, cramps, atrophy.  No use of non steroidal  antiinflammatory drugs. Derm: Denies rash, itching, dry skin, hives, moles, warts, or unhealing ulcers.  Psych: Denies depression, anxiety, memory loss, suicidal ideation, hallucinations, paranoia, and confusion. Heme: Denies bruising, bleeding, and enlarged lymph nodes. Neuro: No headache.  No diplopia. No dysarthria.  No dysphasia.  No history of CVA.  No Seizures. No paresthesias.  No weakness. Endocrine  + DM.  No Thyroid disease.  No Adrenal disease.  Physical Exam: Vital signs in last 24 hours: Temp:  [98.2 F (36.8 C)] 98.2 F (36.8 C) (07/16 1101) Pulse Rate:  [48-58] 51 (07/16 1445) Resp:  [5-47] 8 (07/16 1421) BP: (129-159)/(54-68) 129/54 (07/16 1445) SpO2:  [0 %-100 %] 96 % (07/16 1445) Weight:  [140 lb (63.5 kg)] 140 lb (63.5 kg) (07/16 1101)   General:   Alert,  Well-developed, well-nourished, pleasant and cooperative in NAD Head:  Normocephalic and atraumatic. Eyes:  Sclera clear, no icterus.   Conjunctiva pink. Ears:  Normal auditory acuity. Nose:  No deformity, discharge,  or lesions. Mouth:  No deformity or lesions, dentition normal. Neck:  Supple; no masses or thyromegaly. JVP not elevated Lungs:  Clear throughout to auscultation.   No wheezes, crackles, or rhonchi. No acute distress. Heart:  Regular rate and rhythm; no murmurs, clicks, rubs,  or gallops. Abdomen:  Soft, nontender and nondistended. No masses, hepatosplenomegaly or hernias noted. Normal bowel sounds, without guarding, and without rebound.   Msk:  Symmetrical without gross deformities. Normal posture. Pulses:  No carotid, renal, femoral bruits. DP and PT symmetrical and equal Extremities:  Without clubbing or edema. AVF Left thrill and bruit  Neurologic:  Alert and  oriented x4;  grossly normal neurologically. Skin:  Intact without significant lesions or rashes. Cervical Nodes:  No significant cervical adenopathy. Psych:  Alert and cooperative. Normal mood and affect.  Intake/Output from previous  day: No intake/output data recorded. Intake/Output this shift: No intake/output data recorded.  Lab Results:  Recent Labs    09/18/17 1156 09/18/17 1736  HGB 12.2 15.0  HCT 36.0 44.0   BMET Recent Labs    09/18/17 1156 09/18/17 1213 09/18/17 1736 09/18/17 1746  NA 139  --  138  --   K 6.3* 6.6* 6.9* 6.9*  CL 104  --  106  --   GLUCOSE 85  --  197*  --   BUN 68*  --  72*  --   CREATININE 9.40*  --  9.80*  --    LFT No results for input(s): PROT, ALBUMIN, AST, ALT, ALKPHOS, BILITOT, BILIDIR, IBILI in the last 72 hours. PT/INR No results for input(s): LABPROT, INR in the last 72 hours. Hepatitis Panel No results for input(s): HEPBSAG, HCVAB, HEPAIGM, HEPBIGM in the last 72 hours.  Studies/Results: No results found.  Assessment/Plan:  Hyperkalemia  It appears that dialysis will need to be performed emergently and will proceed with 2 hours  Zero K dialysate and patient will be stable for discharge. I would like a 1 hour K in treatment to document improvement.  End stage Renal disease  She can return in AM to [facility]  Dialysis clinic.   Volume looks good  No edema no sob and oxygen sats 100% room air   Will remove 1 -2 L  Anemia Hb 15    LOS: 0 Tab Rylee W @TODAY @7 :34 PM

## 2017-09-18 NOTE — Interval H&P Note (Signed)
History and Physical Interval Note:  09/18/2017 1:32 PM  [name]  has presented today for surgery, with the diagnosis of complication of fistula  The various methods of treatment have been discussed with the patient and family. After consideration of risks, benefits and other options for treatment, the patient has consented to  Procedure(s): A/V FISTULAGRAM (Left) as a surgical intervention .  The patient's history has been reviewed, patient examined, no change in status, stable for surgery.  I have reviewed the patient's chart and labs.  Questions were answered to the patient's satisfaction.     Annamarie Major

## 2017-09-18 NOTE — Progress Notes (Signed)
HD tx ended 10 min early @ 2200 per Dr. 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 Pt and daughter understand this and tell me they will relay this to staff at [facility] tomorrow.

## 2017-09-18 NOTE — Progress Notes (Signed)
Report received from [facility]

## 2017-09-18 NOTE — Progress Notes (Signed)
Client transferred to ER via stretcher with cardiac monitor showing sinus bradycardia; hemodialysis unit called for client per Dr Justin Mend and client transferred to hemodialysis unit via stretcher with cardiac monitor and report given to dialysis nurse

## 2017-09-18 NOTE — Progress Notes (Signed)
Potassium 6.3 on ISTAT.  Redraw K done and Dr. Trula Slade notified.

## 2017-09-18 NOTE — Progress Notes (Signed)
Dr. Trula Slade notified of repeat K+ 6.6. No new orders received.

## 2017-09-18 NOTE — Discharge Instructions (Signed)

## 2017-09-18 NOTE — Progress Notes (Signed)
Pt finished drinking kayexalate at 1645 but vomitted up a large amount of emesis @ 1730. Dr. Florene Glen notified.

## 2017-09-18 NOTE — Progress Notes (Addendum)
CRITICAL VALUE ALERT  Critical Value:  K+ 6.9  Date & Time Notied: 09/18/17 1900  Provider Notified: Dr Florene Glen  Orders Received/Actions taken: transfer to ER per Dr Florene Glen

## 2017-09-18 NOTE — Progress Notes (Signed)
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 Justin Mend was made aware, once successfully stuck, pull/push/flush well, VSS, will cont to monitor while on HD tx

## 2017-09-19 ENCOUNTER — Encounter (HOSPITAL_COMMUNITY): Payer: Self-pay | Admitting: Surgery

## 2017-09-21 ENCOUNTER — Observation Stay (HOSPITAL_COMMUNITY)
Admission: EM | Admit: 2017-09-21 | Discharge: 2017-09-22 | Disposition: A | Discharge status: Home / Self Care | Payer: BLUE CROSS/BLUE SHIELD | Attending: Family Medicine | Admitting: Family Medicine

## 2017-09-21 ENCOUNTER — Emergency Department (HOSPITAL_COMMUNITY): Payer: BLUE CROSS/BLUE SHIELD

## 2017-09-21 ENCOUNTER — Other Ambulatory Visit: Payer: Self-pay

## 2017-09-21 ENCOUNTER — Encounter (HOSPITAL_COMMUNITY): Payer: Self-pay | Admitting: Emergency Medicine

## 2017-09-21 DIAGNOSIS — R110 Nausea: Secondary | ICD-10-CM | POA: Diagnosis not present

## 2017-09-21 DIAGNOSIS — Z940 Kidney transplant status: Secondary | ICD-10-CM | POA: Diagnosis not present

## 2017-09-21 DIAGNOSIS — R6883 Chills (without fever): Secondary | ICD-10-CM | POA: Diagnosis not present

## 2017-09-21 DIAGNOSIS — Z992 Dependence on renal dialysis: Secondary | ICD-10-CM | POA: Diagnosis not present

## 2017-09-21 DIAGNOSIS — I358 Other nonrheumatic aortic valve disorders: Secondary | ICD-10-CM | POA: Diagnosis not present

## 2017-09-21 DIAGNOSIS — N186 End stage renal disease: Secondary | ICD-10-CM | POA: Diagnosis not present

## 2017-09-21 DIAGNOSIS — I10 Essential (primary) hypertension: Secondary | ICD-10-CM | POA: Diagnosis not present

## 2017-09-21 DIAGNOSIS — E1159 Type 2 diabetes mellitus with other circulatory complications: Secondary | ICD-10-CM

## 2017-09-21 DIAGNOSIS — E119 Type 2 diabetes mellitus without complications: Secondary | ICD-10-CM

## 2017-09-21 DIAGNOSIS — R509 Fever, unspecified: Principal | ICD-10-CM | POA: Diagnosis present

## 2017-09-21 DIAGNOSIS — I5032 Chronic diastolic (congestive) heart failure: Secondary | ICD-10-CM | POA: Diagnosis not present

## 2017-09-21 DIAGNOSIS — M7918 Myalgia, other site: Secondary | ICD-10-CM | POA: Diagnosis not present

## 2017-09-21 DIAGNOSIS — D638 Anemia in other chronic diseases classified elsewhere: Secondary | ICD-10-CM

## 2017-09-21 DIAGNOSIS — Z794 Long term (current) use of insulin: Secondary | ICD-10-CM

## 2017-09-21 DIAGNOSIS — I130 Hypertensive heart and chronic kidney disease with heart failure and stage 1 through stage 4 chronic kidney disease, or unspecified chronic kidney disease: Secondary | ICD-10-CM | POA: Insufficient documentation

## 2017-09-21 DIAGNOSIS — R0989 Other specified symptoms and signs involving the circulatory and respiratory systems: Secondary | ICD-10-CM | POA: Diagnosis not present

## 2017-09-21 DIAGNOSIS — Z7982 Long term (current) use of aspirin: Secondary | ICD-10-CM | POA: Insufficient documentation

## 2017-09-21 DIAGNOSIS — I517 Cardiomegaly: Secondary | ICD-10-CM | POA: Diagnosis not present

## 2017-09-21 DIAGNOSIS — E1165 Type 2 diabetes mellitus with hyperglycemia: Secondary | ICD-10-CM

## 2017-09-21 DIAGNOSIS — E785 Hyperlipidemia, unspecified: Secondary | ICD-10-CM

## 2017-09-21 DIAGNOSIS — E1122 Type 2 diabetes mellitus with diabetic chronic kidney disease: Secondary | ICD-10-CM | POA: Insufficient documentation

## 2017-09-21 DIAGNOSIS — Z79899 Other long term (current) drug therapy: Secondary | ICD-10-CM | POA: Diagnosis not present

## 2017-09-21 DIAGNOSIS — E1169 Type 2 diabetes mellitus with other specified complication: Secondary | ICD-10-CM

## 2017-09-21 DIAGNOSIS — R531 Weakness: Secondary | ICD-10-CM

## 2017-09-21 DIAGNOSIS — Z7984 Long term (current) use of oral hypoglycemic drugs: Secondary | ICD-10-CM | POA: Insufficient documentation

## 2017-09-21 DIAGNOSIS — N184 Chronic kidney disease, stage 4 (severe): Secondary | ICD-10-CM | POA: Insufficient documentation

## 2017-09-21 DIAGNOSIS — D72829 Elevated white blood cell count, unspecified: Secondary | ICD-10-CM

## 2017-09-21 LAB — CBC WITH DIFFERENTIAL/PLATELET
Basophils Absolute: 0 10*3/uL (ref 0.0–0.1)
Basophils Relative: 0 %
EOS ABS: 0.6 10*3/uL (ref 0.0–0.7)
Eosinophils Relative: 5 %
HEMATOCRIT: 38.8 % (ref 36.0–46.0)
HEMOGLOBIN: 12.3 g/dL (ref 12.0–15.0)
LYMPHS ABS: 0.7 10*3/uL (ref 0.7–4.0)
LYMPHS PCT: 5 %
MCH: 31 pg (ref 26.0–34.0)
MCHC: 31.7 g/dL (ref 30.0–36.0)
MCV: 97.7 fL (ref 78.0–100.0)
MONOS PCT: 9 %
Monocytes Absolute: 1.2 10*3/uL — ABNORMAL HIGH (ref 0.1–1.0)
NEUTROS ABS: 10.5 10*3/uL — AB (ref 1.7–7.7)
NEUTROS PCT: 81 %
Platelets: 152 10*3/uL (ref 150–400)
RBC: 3.97 MIL/uL (ref 3.87–5.11)
RDW: 14.1 % (ref 11.5–15.5)
WBC: 13 10*3/uL — AB (ref 4.0–10.5)

## 2017-09-21 LAB — LACTIC ACID, PLASMA
Lactic Acid, Venous: 1 mmol/L (ref 0.5–1.9)
Lactic Acid, Venous: 0.7 mmol/L (ref 0.5–1.9)

## 2017-09-21 LAB — TROPONIN I
TROPONIN I: 0.06 ng/mL — AB (ref <0.03)
Troponin I: 0.04 ng/mL (ref <0.03)

## 2017-09-21 LAB — MAGNESIUM: MAGNESIUM: 2.4 mg/dL (ref 1.7–2.4)

## 2017-09-21 LAB — COMPREHENSIVE METABOLIC PANEL
ALT: 18 U/L (ref 0–44)
AST: 19 U/L (ref 15–41)
Albumin: 3.7 g/dL (ref 3.5–5.0)
Alkaline Phosphatase: 153 U/L — ABNORMAL HIGH (ref 38–126)
Anion gap: 15 (ref 5–15)
BUN: 73 mg/dL — ABNORMAL HIGH (ref 8–23)
CHLORIDE: 94 mmol/L — AB (ref 98–111)
CO2: 27 mmol/L (ref 22–32)
CREATININE: 10.29 mg/dL — AB (ref 0.44–1.00)
Calcium: 7.4 mg/dL — ABNORMAL LOW (ref 8.9–10.3)
GFR calc Af Amer: 4 mL/min — ABNORMAL LOW (ref >60)
GFR calc non Af Amer: 4 mL/min — ABNORMAL LOW (ref >60)
Glucose, Bld: 65 mg/dL — ABNORMAL LOW (ref 70–99)
POTASSIUM: 4.3 mmol/L (ref 3.5–5.1)
SODIUM: 136 mmol/L (ref 135–145)
Total Bilirubin: 0.6 mg/dL (ref 0.3–1.2)
Total Protein: 7.6 g/dL (ref 6.5–8.1)

## 2017-09-21 LAB — GLUCOSE, CAPILLARY
GLUCOSE-CAPILLARY: 79 mg/dL (ref 70–99)
GLUCOSE-CAPILLARY: 160 mg/dL — AB (ref 70–99)

## 2017-09-21 LAB — CBG MONITORING, ED: Glucose-Capillary: 66 mg/dL — ABNORMAL LOW (ref 70–99)

## 2017-09-21 MED ORDER — SEVELAMER CARBONATE 800 MG PO TABS
3200.0000 mg | ORAL_TABLET | Freq: Three times a day (TID) | ORAL | Status: DC
  Administered 2017-09-21 – 2017-09-22 (×3): 3200 mg via ORAL
  Filled 2017-09-21 (×3): qty 4

## 2017-09-21 MED ORDER — ONDANSETRON HCL 4 MG PO TABS: 4.0000 mg | ORAL_TABLET | Freq: Four times a day (QID) | ORAL | Status: DC | PRN

## 2017-09-21 MED ORDER — ONDANSETRON HCL 4 MG/2ML IJ SOLN: 4.0000 mg | Freq: Four times a day (QID) | INTRAMUSCULAR | Status: DC | PRN

## 2017-09-21 MED ORDER — ROSUVASTATIN CALCIUM 20 MG PO TABS
20.0000 mg | ORAL_TABLET | Freq: Every day | ORAL | Status: DC
  Administered 2017-09-22: 20 mg via ORAL
  Filled 2017-09-21: qty 1

## 2017-09-21 MED ORDER — LIDOCAINE HCL (PF) 1 % IJ SOLN: 5.0000 mL | INTRAMUSCULAR | Status: DC | PRN

## 2017-09-21 MED ORDER — ONDANSETRON HCL 4 MG/2ML IJ SOLN
4.0000 mg | Freq: Once | INTRAMUSCULAR | Status: AC
  Administered 2017-09-21: 4 mg via INTRAVENOUS
  Filled 2017-09-21: qty 2

## 2017-09-21 MED ORDER — ACETAMINOPHEN 325 MG PO TABS
650.0000 mg | ORAL_TABLET | Freq: Once | ORAL | Status: AC
  Administered 2017-09-21: 650 mg via ORAL
  Filled 2017-09-21: qty 2

## 2017-09-21 MED ORDER — PREDNISONE 10 MG PO TABS
5.0000 mg | ORAL_TABLET | Freq: Every day | ORAL | Status: DC
  Administered 2017-09-22: 5 mg via ORAL
  Filled 2017-09-21: qty 1

## 2017-09-21 MED ORDER — LIDOCAINE-PRILOCAINE 2.5-2.5 % EX CREA: 1.0000 "application " | TOPICAL_CREAM | CUTANEOUS | Status: DC | PRN

## 2017-09-21 MED ORDER — ISOSORBIDE MONONITRATE ER 60 MG PO TB24
60.0000 mg | ORAL_TABLET | Freq: Every day | ORAL | Status: DC
  Administered 2017-09-22: 60 mg via ORAL
  Filled 2017-09-21: qty 1

## 2017-09-21 MED ORDER — SODIUM CHLORIDE 0.9 % IV SOLN: 100.0000 mL | INTRAVENOUS | Status: DC | PRN

## 2017-09-21 MED ORDER — AMLODIPINE BESYLATE 5 MG PO TABS
10.0000 mg | ORAL_TABLET | Freq: Every day | ORAL | Status: DC
  Administered 2017-09-22: 10 mg via ORAL
  Filled 2017-09-21: qty 2

## 2017-09-21 MED ORDER — TACROLIMUS 0.5 MG PO CAPS
1.0000 mg | ORAL_CAPSULE | Freq: Every morning | ORAL | Status: DC
  Administered 2017-09-22: 1 mg via ORAL
  Filled 2017-09-21: qty 2

## 2017-09-21 MED ORDER — INSULIN ASPART 100 UNIT/ML ~~LOC~~ SOLN
0.0000 [IU] | Freq: Three times a day (TID) | SUBCUTANEOUS | Status: DC
Start: 2017-09-21 — End: 2017-09-22
  Administered 2017-09-21 – 2017-09-22 (×2): 2 [IU] via SUBCUTANEOUS

## 2017-09-21 MED ORDER — SODIUM CHLORIDE 0.9 % IV BOLUS: 1000.0000 mL | Freq: Once | INTRAVENOUS | Status: DC

## 2017-09-21 MED ORDER — CHLORHEXIDINE GLUCONATE CLOTH 2 % EX PADS: 6.0000 | MEDICATED_PAD | Freq: Every day | CUTANEOUS | Status: DC

## 2017-09-21 MED ORDER — SODIUM CHLORIDE 0.9 % IV SOLN
Freq: Once | INTRAVENOUS | Status: AC
  Administered 2017-09-21: 09:00:00 via INTRAVENOUS

## 2017-09-21 MED ORDER — HEPARIN SODIUM (PORCINE) 1000 UNIT/ML DIALYSIS
20.0000 [IU]/kg | INTRAMUSCULAR | Status: DC | PRN
  Filled 2017-09-21: qty 2

## 2017-09-21 MED ORDER — PENTAFLUOROPROP-TETRAFLUOROETH EX AERO: 1.0000 "application " | INHALATION_SPRAY | CUTANEOUS | Status: DC | PRN

## 2017-09-21 MED ORDER — METOPROLOL TARTRATE 25 MG PO TABS
12.5000 mg | ORAL_TABLET | Freq: Two times a day (BID) | ORAL | Status: DC
  Administered 2017-09-22 (×2): 12.5 mg via ORAL
  Filled 2017-09-21 (×2): qty 1

## 2017-09-21 MED ORDER — ACETAMINOPHEN 650 MG RE SUPP: 650.0000 mg | Freq: Four times a day (QID) | RECTAL | Status: DC | PRN

## 2017-09-21 MED ORDER — ACETAMINOPHEN 325 MG PO TABS
650.0000 mg | ORAL_TABLET | Freq: Four times a day (QID) | ORAL | Status: DC | PRN
  Administered 2017-09-22: 650 mg via ORAL
  Filled 2017-09-21: qty 2

## 2017-09-21 MED ORDER — HEPARIN SODIUM (PORCINE) 5000 UNIT/ML IJ SOLN
5000.0000 [IU] | Freq: Three times a day (TID) | INTRAMUSCULAR | Status: DC
  Administered 2017-09-21 – 2017-09-22 (×2): 5000 [IU] via SUBCUTANEOUS
  Filled 2017-09-21 (×2): qty 1

## 2017-09-21 MED ORDER — TACROLIMUS 0.5 MG PO CAPS
0.5000 mg | ORAL_CAPSULE | Freq: Every day | ORAL | Status: DC
  Administered 2017-09-21: 0.5 mg via ORAL
  Filled 2017-09-21: qty 1

## 2017-09-21 MED ORDER — INSULIN GLARGINE 100 UNIT/ML ~~LOC~~ SOLN
8.0000 [IU] | Freq: Every day | SUBCUTANEOUS | Status: DC
  Administered 2017-09-21: 8 [IU] via SUBCUTANEOUS
  Filled 2017-09-21 (×3): qty 0.08

## 2017-09-21 MED ORDER — ASPIRIN EC 81 MG PO TBEC
81.0000 mg | DELAYED_RELEASE_TABLET | Freq: Every day | ORAL | Status: DC
  Administered 2017-09-22: 81 mg via ORAL
  Filled 2017-09-21: qty 1

## 2017-09-21 NOTE — ED Triage Notes (Addendum)
Pt reports sudden onset of nausea, chills, and HA that began 0230 this morning. Denies v/d or abdominal right. Pt is a dialysis patient, last dialyzed on Tuesday.

## 2017-09-21 NOTE — H&P (Signed)
History and Physical  [name] WLS:[id number] DOB: 29-Nov-1955 DOA: 09/21/2017  Referring physician: Vanessa [place] PCP: Sharion Balloon, FNP   Chief Complaint: Chills and headache  HPI: [name] is a 62 y.o. female end-stage renal disease on dialysis x1 year in [place] receives treatments MWF was just discharged from [facility] on 09/18/2017 after having an elective angioplasty by Dr. Trula Slade.  She was having poor function of her dialysis access.  At that time it is noted that she was seen by nephrology for an urgent hemodialysis session secondary to hyperkalemia which was corrected after treatment.  She apparently had been doing fairly well after being discharged.  Her last hemodialysis was Tuesday and she was scheduled for hemodialysis this morning.  She called her dialysis center and was told to come to the ED for evaluation because she was complaining of fever and nausea.  The patient was seen in the emergency department today and noted to have a temperature of 100.4.  The patient was noted to have a mildly elevated white blood cell count at 13.0.  The patient had signs of pulmonary vascular congestion and pulmonary edema on chest x-ray.  She was complaining of nausea but no emesis.  No diarrhea.  No chest pain and no shortness of breath symptoms.  His abdominal pain, diarrhea.  She does have generalized weakness.  Her potassium is within normal limits at this time.  She is being admitted for observation due to recent procedure now presenting with low-grade fever and elevated white blood cell count.  She does need to have hemodialysis today and we will consult nephrology for assistance.   Review of Systems: All systems reviewed and apart from history of presenting illness, are negative.  Past Medical History:  Diagnosis Date  . Anemia    of chronic disease  . Blood transfusion without reported diagnosis   . Cardiovascular disease    nonobstructive  . Carotid artery stenosis 2008  .  CHF (congestive heart failure) ([place])   . Coronary artery disease   . Diabetes mellitus   . Dyslipidemia   . Edema of lower extremity    with hypo-albuminemia and profound protenuria  . Heart murmur   . Hypertension   . Mitral regurgitation   . Nephrotic syndrome   . Patent foramen ovale   . Pulmonary hypertension, moderate to severe ([place])   . Pulmonary nodule   . Tricuspid regurgitation   . Volume depletion, renal, due to output loss (renal deficit)    Past Surgical History:  Procedure Laterality Date  . A/V FISTULAGRAM N/A 04/05/2017   Procedure: A/V FISTULAGRAM - Left Arm;  Surgeon: Angelia Mould, MD;  Location: [facility];  Service: Cardiovascular;  Laterality: N/A;  . A/V FISTULAGRAM Left 09/18/2017   Procedure: A/V FISTULAGRAM;  Surgeon: Serafina Mitchell, MD;  Location: [facility];  Service: Cardiovascular;  Laterality: Left;  . A/V SHUNT INTERVENTION Left 04/05/2017   Procedure: A/V SHUNT INTERVENTION;  Surgeon: Angelia Mould, MD;  Location: [facility];  Service: Cardiovascular;  Laterality: Left;  . CARDIAC CATHETERIZATION  2008  . IR REMOVAL TUN CV CATH W/O FL  11/02/2016  . 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 PERIPHERAL VASCULAR BALLOON ANGIOPLASTY Left 09/18/2017   Procedure: PERIPHERAL VASCULAR BALLOON ANGIOPLASTY;  Surgeon: Serafina Mitchell, MD;  Location: [facility];  Service: Cardiovascular;  Laterality: Left;  Arm Fistula   Social History:  reports that she has  never smoked. She has never used smokeless tobacco. She reports that she does not drink alcohol or use drugs.  Allergies  Allergen Reactions  . Other Other (See Comments)    Tape Bruises and tears skin, Paper tape tolerated    Family History  Problem Relation Age of Onset  . Kidney disease Mother   . Diabetes Mother   . Diabetes Father   . Heart disease Father     Prior to Admission medications   Medication Sig Start Date End Date Taking? Authorizing  Provider  acetaminophen (TYLENOL) 500 MG tablet Take 500 mg by mouth every 6 (six) hours as needed for moderate pain or headache.  04/06/16  Yes [provider]  amLODipine (NORVASC) 10 MG tablet TAKE 10 MG BY MOUTH NIGHTLY 10/24/16  Yes [provider]  aspirin (ASPIR-LOW) 81 MG EC tablet Take 81 mg by mouth daily.    Yes [provider]  insulin degludec (TRESIBA FLEXTOUCH) 100 UNIT/ML SOPN FlexTouch Pen Inject 0.13 mLs (13 Units total) into the skin daily at 10 pm. 02/23/17  Yes Hawks, Christy A, FNP  isosorbide mononitrate (IMDUR) 60 MG 24 hr tablet Take 1 tablet (60 mg total) by mouth daily. 04/03/17  Yes Hawks, Christy A, FNP  lidocaine-prilocaine (EMLA) cream Apply 1 application topically as needed (for dialysis).  09/08/16  Yes [provider]  metoprolol tartrate (LOPRESSOR) 25 MG tablet Take 12.5 mg by mouth 2 (two) times daily.  10/23/16  Yes [provider]  ondansetron (ZOFRAN-ODT) 4 MG disintegrating tablet Take 1 tablet (4 mg total) by mouth every 8 (eight) hours as needed for nausea or vomiting. 04/03/17  Yes Hawks, Christy A, FNP  predniSONE (DELTASONE) 5 MG tablet Take 1 tablet (5 mg total) by mouth daily with breakfast. 04/03/17  Yes Hawks, Christy A, FNP  PROGRAF 0.5 MG capsule Take 0.5-1 mg by mouth See admin instructions. Take 1 mg by mouth in the morning and take 0.5 mg by mouth at bedtime 10/24/16  Yes [provider]  rosuvastatin (CRESTOR) 20 MG tablet Take 1 tablet (20 mg total) by mouth daily. 04/03/17  Yes Hawks, Christy A, FNP  sevelamer carbonate (RENVELA) 800 MG tablet Take 3200 mg by mouth 3 times daily with meals and take 1600 mg by mouth with snacks 10/02/16  Yes [provider]  TRULICITY 1.5 XN/1.7GY SOPN INJECT 1.5 MG INTO THE SKIN ONCE A WEEK Patient taking differently: INJECT 1.5 MG INTO THE SKIN ONCE A WEEK ON SATURDAY 09/10/17   Sharion Balloon, FNP   Physical Exam: Vitals:   09/21/17 1000 09/21/17 1100  09/21/17 1130 09/21/17 1200  BP: (!) 128/45 (!) 128/45 (!) 116/42 (!) 121/45  Pulse: 67 (!) 49 66 (!) 55  Resp: 15 19 12 18   Temp:  98.9 F (37.2 C)    TempSrc:  Oral    SpO2: 92% 91% 96% 95%  Weight:      Height:        General exam: Chronically ill-appearing female, moderately built and nourished patient, lying comfortably supine on the gurney in no obvious distress.  Head, eyes and ENT: Nontraumatic and normocephalic. Pupils equally reacting to light and accommodation. Oral mucosa and dry.  Neck: Supple. No JVD, carotid bruit or thyromegaly.  Lymphatics: No lymphadenopathy.  Respiratory system: Bibasilar crackles.  No increased work of breathing.  Cardiovascular system: S1 and S2 heard with harsh holosystolic murmur.    Gastrointestinal system: Abdomen is nondistended, soft and nontender. Normal bowel sounds  heard. No organomegaly or masses appreciated.  Central nervous system: Alert and oriented. No focal neurological deficits.  Extremities: Symmetric 5 x 5 power. Peripheral pulses symmetrically felt. AVF Left thrill and bruit  left forearm sutures present with no s/s of infection seen.   Skin: No rashes or acute findings.  Musculoskeletal system: Negative exam.  Trace pretibial edema bilateral lower extremities.  Psychiatry: Pleasant and cooperative.  Labs on Admission:  Basic Metabolic Panel: Recent Labs  Lab 09/18/17 1156 09/18/17 1213 09/18/17 1736 09/18/17 1746 09/18/17 2118 09/21/17 0937  NA 139  --  138  --   --  136  K 6.3* 6.6* 6.9* 6.9* 3.5 4.3  CL 104  --  106  --   --  94*  CO2  --   --   --   --   --  27  GLUCOSE 85  --  197*  --   --  65*  BUN 68*  --  72*  --   --  73*  CREATININE 9.40*  --  9.80*  --   --  10.29*  CALCIUM  --   --   --   --   --  7.4*  MG  --   --   --   --   --  2.4   Liver Function Tests: Recent Labs  Lab 09/21/17 0937  AST 19  ALT 18  ALKPHOS 153*  BILITOT 0.6  PROT 7.6  ALBUMIN 3.7   No results for input(s):  LIPASE, AMYLASE in the last 168 hours. No results for input(s): AMMONIA in the last 168 hours. CBC: Recent Labs  Lab 09/18/17 1156 09/18/17 1736 09/21/17 0937  WBC  --   --  13.0*  NEUTROABS  --   --  10.5*  HGB 12.2 15.0 12.3  HCT 36.0 44.0 38.8  MCV  --   --  97.7  PLT  --   --  152   Cardiac Enzymes: Recent Labs  Lab 09/21/17 0937  TROPONINI 0.04*    BNP (last 3 results) No results for input(s): PROBNP in the last 8760 hours. CBG: Recent Labs  Lab 09/18/17 1104  GLUCAP 84    Radiological Exams on Admission: Dg Chest Portable 1 View  Result Date: 09/21/2017 CLINICAL DATA:  Sudden onset of nausea chills and headache since 02/30 this morning. EXAM: PORTABLE CHEST 1 VIEW COMPARISON:  06/15/2017 FINDINGS: The heart is enlarged but stable. Moderate vascular congestion and possible mild interstitial edema. No infiltrates or effusions. The bony thorax is intact. IMPRESSION: Cardiac enlargement and moderate vascular congestion with possible mild interstitial edema. No infiltrates or effusions. Electronically Signed   By: Marijo Sanes M.D.   On: 09/21/2017 09:42    Assessment/Plan Active Problems:   Hypertension associated with diabetes ([place])   Chronic diastolic heart failure (HCC)   CKD (chronic kidney disease) requiring chronic dialysis ([place])   Diabetes mellitus ([place])   Aortic valve sclerosis   Anemia of chronic disease   Hyperlipidemia associated with type 2 diabetes mellitus (HCC)   Nausea   Leukocytosis   Mild fever   Chills   Generalized weakness  1. Generalized weakness with low-grade temperature and leukocytosis-no evidence of infection found at this time, urinalysis still pending.  Chest x-ray does not have acute findings of pneumonia.  Fever treated with Tylenol as needed.  Blood cultures obtained and will follow.  Repeat CBC with differential in the morning.  Lactic acid within normal limits. 2. End-stage renal  disease on hemodialysis-patient is  due for  hemodialysis today.  I will consult the nephrologist to request assistance with hemodialysis treatments.  Follow renal function panel. 3. Mild troponin elevation - likely secondary to ESRD, do not suspect ACS.  4. Volume overload and Pulmonary edema-secondary to volume overload- treat with hemodialysis fluid removal. 5. Leukocytosis-suspect this is secondary to recent angioplasty procedure done 3 days ago at [facility].  No signs of infection found.  Repeat CBC with differential in the morning.  Check urinalysis.  Repeat chest x-ray in the morning after fluid removal.  6. Heart Murmur - The patient had an echocardiogram in May 2019 and there was aortic sclerosis but no aortic stenosis found.   7. 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 8. Anemia of chronic disease- hemoglobin stable from recent testing. 9. Postop day #3 status post recent angioplasty, left cephalic vein-hopefully her poorly functioning left radiocephalic fistula will improve after this treatment. Reportedly after the procedure there was less than 20% residual stenosis after the angioplasty.   Pt says that she is due to have sutures and small foreign body removed when she went to HD today.  Will ask nephrologist to evaluate as this may be causing her low grade fever and leukocytosis.       DVT Prophylaxis: Heparin Code Status: Full Family Communication: Patient fully updated at bedside Disposition Plan: Home tomorrow if stable  Time spent: 55 minutes  Irwin Brakeman, MD [facility] Pager [phone]  If 7PM-7AM, please contact night-coverage [email or web address] Password [email or web address] 09/21/2017, 12:12 PM

## 2017-09-21 NOTE — ED Provider Notes (Signed)
[facility] EMERGENCY DEPARTMENT Provider Note   CSN: [id number] Arrival date & time: 09/21/17  0747     History   Chief Complaint Chief Complaint  Patient presents with  . Nausea    HPI [name] is a 62 y.o. female.  HPI   [name] is a 62 y.o. female with a past medical history of chronic diastolic heart failure, diabetes, and chronic kidney disease requiring dialysis, who presents to the Emergency Department complaining of nausea, chills, and generalized body aches.  Symptoms began this morning around 2:30 AM.  She describes waking up feeling "hot" and later developing chills which has been intermittent throughout the morning.  Patient was last dialyzed on Tuesday and was advised at that time her potassium was low, she was scheduled for dialysis again this morning and advised because she did not feel well and that her potassium was low previously, she was suggested to come to the emergency room for further evaluation.  She denies abdominal pain, chest pain, shortness of breath, or vomiting.  Denies swelling of her extremities.   Past Medical History:  Diagnosis Date  . Anemia    of chronic disease  . Blood transfusion without reported diagnosis   . Cardiovascular disease    nonobstructive  . Carotid artery stenosis 2008  . CHF (congestive heart failure) ([place])   . Coronary artery disease   . Diabetes mellitus   . Dyslipidemia   . Edema of lower extremity    with hypo-albuminemia and profound protenuria  . Heart murmur   . Hypertension   . Mitral regurgitation   . Nephrotic syndrome   . Patent foramen ovale   . Pulmonary hypertension, moderate to severe ([place])   . Pulmonary nodule   . Tricuspid regurgitation   . Volume depletion, renal, due to output loss (renal deficit)     Patient Active Problem List   Diagnosis Date Noted  . Nausea & vomiting 05/30/2017  . UTI (urinary tract infection) 05/30/2017  . Hyperlipidemia associated with type 2 diabetes  mellitus ([place]) 02/22/2017  . CKD (chronic kidney disease) requiring chronic dialysis ([place]) September 03, 2016  . 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 Encounter for aftercare following kidney transplant 05/22/2016  . Chronic diastolic heart failure ([place]) 06/08/2015  . Aortic valve sclerosis 06/08/2015  . CKD (chronic kidney disease) stage 4, GFR 15-29 ml/min (HCC) 01/01/2015  . Anemia of chronic disease 07/03/2013  . Hypertension associated with diabetes ([place]) 12/18/2010  . Diabetes mellitus ([place]) 12/18/2010    Past Surgical History:  Procedure Laterality Date  . A/V FISTULAGRAM N/A 04/05/2017   Procedure: A/V FISTULAGRAM - Left Arm;  Surgeon: Angelia Mould, MD;  Location: [facility];  Service: Cardiovascular;  Laterality: N/A;  . A/V FISTULAGRAM Left 09/18/2017   Procedure: A/V FISTULAGRAM;  Surgeon: Serafina Mitchell, MD;  Location: [facility];  Service: Cardiovascular;  Laterality: Left;  . A/V SHUNT INTERVENTION Left 04/05/2017   Procedure: A/V SHUNT INTERVENTION;  Surgeon: Angelia Mould, MD;  Location: [facility];  Service: Cardiovascular;  Laterality: Left;  . CARDIAC CATHETERIZATION  2008  . IR REMOVAL TUN CV CATH W/O FL  11/02/2016  . 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 PERIPHERAL VASCULAR BALLOON ANGIOPLASTY Left 09/18/2017   Procedure: PERIPHERAL VASCULAR BALLOON ANGIOPLASTY;  Surgeon: Serafina Mitchell, MD;  Location: [facility];  Service: Cardiovascular;  Laterality: Left;  Arm Fistula     OB History   None  Home Medications    Prior to Admission medications   Medication Sig Start Date End Date Taking? Authorizing Provider  acetaminophen (TYLENOL) 500 MG tablet Take 500 mg by mouth every 6 (six) hours as needed for moderate pain or headache.  04/06/16   [provider]  amLODipine (NORVASC) 10 MG tablet TAKE 10 MG BY MOUTH NIGHTLY 10/24/16   [provider]  aspirin (ASPIR-LOW) 81 MG EC  tablet Take 81 mg by mouth daily.     [provider]  insulin degludec (TRESIBA FLEXTOUCH) 100 UNIT/ML SOPN FlexTouch Pen Inject 0.13 mLs (13 Units total) into the skin daily at 10 pm. 02/23/17   Sharion Balloon, FNP  isosorbide mononitrate (IMDUR) 60 MG 24 hr tablet Take 1 tablet (60 mg total) by mouth daily. 04/03/17   Sharion Balloon, FNP  lidocaine-prilocaine (EMLA) cream Apply 1 application topically as needed (for dialysis).  09/08/16   [provider]  metoprolol tartrate (LOPRESSOR) 25 MG tablet Take 12.5 mg by mouth 2 (two) times daily.  10/23/16   [provider]  ondansetron (ZOFRAN-ODT) 4 MG disintegrating tablet Take 1 tablet (4 mg total) by mouth every 8 (eight) hours as needed for nausea or vomiting. 04/03/17   Sharion Balloon, FNP  predniSONE (DELTASONE) 5 MG tablet Take 1 tablet (5 mg total) by mouth daily with breakfast. 04/03/17   Evelina Dun A, FNP  PROGRAF 0.5 MG capsule Take 0.5-1 mg by mouth See admin instructions. Take 1 mg by mouth in the morning and take 0.5 mg by mouth at bedtime 10/24/16   [provider]  rosuvastatin (CRESTOR) 20 MG tablet Take 1 tablet (20 mg total) by mouth daily. 04/03/17   Sharion Balloon, FNP  sevelamer carbonate (RENVELA) 800 MG tablet Take 3200 mg by mouth 3 times daily with meals and take 1600 mg by mouth with snacks 10/02/16   [provider]  TRULICITY 1.5 WE/3.1VQ SOPN INJECT 1.5 MG INTO THE SKIN ONCE A WEEK Patient taking differently: INJECT 1.5 MG INTO THE SKIN ONCE A WEEK ON SATURDAY 09/10/17   Sharion Balloon, FNP    Family History Family History  Problem Relation Age of Onset  . Kidney disease Mother   . Diabetes Mother   . Diabetes Father   . Heart disease Father     Social History Social History   Tobacco Use  . Smoking status: Never Smoker  . Smokeless tobacco: Never Used  Substance Use Topics  . Alcohol use: No  . Drug use: No     Allergies   Other   Review of  Systems Review of Systems  Constitutional: Positive for chills. Negative for activity change, appetite change and fever.  Respiratory: Negative for chest tightness and shortness of breath.   Cardiovascular: Negative for chest pain.  Gastrointestinal: Positive for nausea. Negative for abdominal pain, blood in stool, diarrhea and vomiting.  Genitourinary: Negative for decreased urine volume, difficulty urinating, dysuria and flank pain.  Musculoskeletal: Positive for myalgias. Negative for back pain, neck pain and neck stiffness.  Skin: Negative for color change and rash.  Neurological: Negative for dizziness, weakness and numbness.  Hematological: Negative for adenopathy.  All other systems reviewed and are negative.    Physical Exam Updated Vital Signs BP (!) 147/59 (BP Location: Right Arm)   Pulse (!) 101   Temp (!) 100.4 F (38 C) (Oral)   Resp 16   Ht 5\' 9"  (1.753 m)   Wt 63.5 kg (140  lb)   SpO2 93%   BMI 20.67 kg/m   Physical Exam  Constitutional: She is oriented to person, place, and time. She appears well-developed. No distress.  HENT:  Mouth/Throat: Uvula is midline. Mucous membranes are dry.  Neck: Normal range of motion. Neck supple. No JVD present.  Cardiovascular: Normal rate.  Murmur heard. Pulmonary/Chest: Effort normal and breath sounds normal. She exhibits no tenderness.  Abdominal: Soft. She exhibits no distension and no mass. There is no tenderness. There is no guarding.  Musculoskeletal: Normal range of motion.  Neurological: She is alert and oriented to person, place, and time. No sensory deficit.  Skin: Skin is warm. Capillary refill takes less than 2 seconds. No rash noted.  Psychiatric: She has a normal mood and affect.  Nursing note and vitals reviewed.    ED Treatments / Results  Labs (all labs ordered are listed, but only abnormal results are displayed)   EKG None  Radiology No results found.  Procedures Procedures (including critical  care time)  Medications Ordered in ED Medications  sodium chloride 0.9 % bolus 1,000 mL (has no administration in time range)  ondansetron (ZOFRAN) injection 4 mg (has no administration in time range)     Initial Impression / Assessment and Plan / ED Course  I have reviewed the triage vital signs and the nursing notes.  Pertinent labs & imaging results that were available during my care of the patient were reviewed by me and considered in my medical decision making (see chart for details).      Pt with hx of CKD on dialysis,  mildly dehydrated with nausea and low grade fever.  Previous hyperkalemia now improved. Non toxic appearing.  Lactic acid wnml.  Hx of kidney transplant.  Given pt hx and risk for declining condition, will consult hospitalist for admit  [place] hospitalist, Dr. Wynetta Emery, who agrees to admit  Final Clinical Impressions(s) / ED Diagnoses   Final diagnoses:  Fever    ED Discharge Orders    None       Kem Parkinson, PA-C 09/22/17 2124    Francine Graven, DO 09/23/17 1819

## 2017-09-21 NOTE — Consult Note (Signed)
[name] MRN: [id number] DOB/AGE: 1956-01-10 62 y.o. Primary Care Physician:Hawks, Theador Hawthorne, FNP Admit date: 09/21/2017 Chief Complaint:  Chief Complaint  Patient presents with  . Nausea   HPI:  Pt is  a 62 year old caucasian  female  with past medical hx of end-stage renal disease on Monday/Wednesday/Friday schedule who came to ER with c/o nausea.  HPI dates back to 09/18/2017 when pt was admitted after having an elective angioplasty by Dr. Trula Slade.  Pt was admitted  for an urgent hemodialysis session secondary to hyperkalemia which was corrected after treatment.   Pt had her last HD then as inpt on Tuesday . Pt was scheduled for hemodialysis this morning. But pt started complaining of fever and nausea.Upon evaluation in the Emergency department today , pt was noted to have a temperature of 100.4.   Patient was noted to have a mildly elevated white blood cell count at 13.0.  On CXR  patient had signs of pulmonary vascular congestion and pulmonary edema  And pt was admitted for further tx.  Pt seen on 3rd floor, pt says I had nausea earlier, now I am better. Pt offers No emesis/No diarrhea. Pt offers no chest pain and no shortness of breath. NO c/o hematuria NO c/o freq/urgency/dysuria NO c/o sick contacts   Past Medical History:  Diagnosis Date  . Anemia    of chronic disease  . Blood transfusion without reported diagnosis   . Cardiovascular disease    nonobstructive  . Carotid artery stenosis 2008  . CHF (congestive heart failure) ([place])   . Coronary artery disease   . Diabetes mellitus   . Dyslipidemia   . Edema of lower extremity    with hypo-albuminemia and profound protenuria  . Heart murmur   . Hypertension   . Mitral regurgitation   . Nephrotic syndrome   . Patent foramen ovale   . Pulmonary hypertension, moderate to severe ([place])   . Pulmonary nodule   . Tricuspid regurgitation   . Volume depletion, renal, due to output loss (renal deficit)          Family History  Problem Relation Age of Onset  . Kidney disease Mother   . Diabetes Mother   . Diabetes Father   . Heart disease Father     Social History:  reports that she has never smoked. She has never used smokeless tobacco. She reports that she does not drink alcohol or use drugs.   Allergies:  Allergies  Allergen Reactions  . Other Other (See Comments)    Tape Bruises and tears skin, Paper tape tolerated    Medications Prior to Admission  Medication Sig Dispense Refill  . acetaminophen (TYLENOL) 500 MG tablet Take 500 mg by mouth every 6 (six) hours as needed for moderate pain or headache.     Marland Kitchen amLODipine (NORVASC) 10 MG tablet TAKE 10 MG BY MOUTH NIGHTLY    . aspirin (ASPIR-LOW) 81 MG EC tablet Take 81 mg by mouth daily.     . insulin degludec (TRESIBA FLEXTOUCH) 100 UNIT/ML SOPN FlexTouch Pen Inject 0.13 mLs (13 Units total) into the skin daily at 10 pm. 12 pen 2  . isosorbide mononitrate (IMDUR) 60 MG 24 hr tablet Take 1 tablet (60 mg total) by mouth daily. 90 tablet 1  . lidocaine-prilocaine (EMLA) cream Apply 1 application topically as needed (for dialysis).   0  . metoprolol tartrate (LOPRESSOR) 25 MG tablet Take 12.5 mg by mouth 2 (two) times daily.  1  . ondansetron (ZOFRAN-ODT) 4 MG disintegrating tablet Take 1 tablet (4 mg total) by mouth every 8 (eight) hours as needed for nausea or vomiting. 20 tablet 2  . predniSONE (DELTASONE) 5 MG tablet Take 1 tablet (5 mg total) by mouth daily with breakfast. 90 tablet 1  . PROGRAF 0.5 MG capsule Take 0.5-1 mg by mouth See admin instructions. Take 1 mg by mouth in the morning and take 0.5 mg by mouth at bedtime  0  . rosuvastatin (CRESTOR) 20 MG tablet Take 1 tablet (20 mg total) by mouth daily. 90 tablet 1  . sevelamer carbonate (RENVELA) 800 MG tablet Take 3200 mg by mouth 3 times daily with meals and take 1600 mg by mouth with snacks    . TRULICITY 1.5 UK/0.2RK SOPN INJECT 1.5 MG INTO THE SKIN ONCE A WEEK  (Patient taking differently: INJECT 1.5 MG INTO THE SKIN ONCE A WEEK ON SATURDAY) 0.5 mL 1       YHC:WCBJS from the symptoms mentioned above,there are no other symptoms referable to all systems reviewed.  Marland Kitchen amLODipine  10 mg Oral Daily  . aspirin EC  81 mg Oral Daily  . heparin  5,000 Units Subcutaneous Q8H  . insulin aspart  0-9 Units Subcutaneous TID WC  . insulin glargine  8 Units Subcutaneous QHS  . isosorbide mononitrate  60 mg Oral Daily  . metoprolol tartrate  12.5 mg Oral BID  . [START ON 09/22/2017] predniSONE  5 mg Oral Q breakfast  . rosuvastatin  20 mg Oral Daily  . sevelamer carbonate  3,200 mg Oral TID WC  . tacrolimus  0.5 mg Oral QHS  . [START ON 09/22/2017] tacrolimus  1 mg Oral q morning - 10a         EGB:TDVVO from the symptoms mentioned above,there are no other symptoms referable to all systems reviewed.  Physical Exam: Vital signs in last 24 hours: Temp:  [97.4 F (36.3 C)-100.4 F (38 C)] 97.4 F (36.3 C) (07/19 1415) Pulse Rate:  [49-101] 52 (07/19 1415) Resp:  [12-19] 16 (07/19 1415) BP: (116-152)/(42-59) 152/53 (07/19 1415) SpO2:  [91 %-100 %] 100 % (07/19 1415) Weight:  [140 lb (63.5 kg)-145 lb 15.1 oz (66.2 kg)] 145 lb 15.1 oz (66.2 kg) (07/19 1415) Weight change:  Last BM Date: 09/20/17  Intake/Output from previous day: No intake/output data recorded. Total I/O In: 620.8 [I.V.:620.8] Out: -    Physical Exam: General- pt is awake,alert, oriented to time place and person Resp- No acute REsp distress,  NO Rhonchi CVS- S1S2 regular in rate and rhythm GIT- BS+, soft, NT, ND EXT- NO LE Edema, Cyanosis CNS- CN 2-12 grossly intact. Moving all 4 extremities Psych- normal mod and affect Access- AVF ( NO erythema, No redness. No tenderness) Sutures in stu   Lab Results: CBC Recent Labs    09/18/17 1736 09/21/17 0937  WBC  --  13.0*  HGB 15.0 12.3  HCT 44.0 38.8  PLT  --  152    BMET Recent Labs    09/18/17 1736  09/18/17 2118  09/21/17 0937  NA 138  --   --  136  K 6.9*   < > 3.5 4.3  CL 106  --   --  94*  CO2  --   --   --  27  GLUCOSE 197*  --   --  65*  BUN 72*  --   --  73*  CREATININE 9.80*  --   --  10.29*  CALCIUM  --   --   --  7.4*   < > = values in this interval not displayed.    MICRO Recent Results (from the past 240 hour(s))  Culture, blood (routine x 2)     Status: None (Preliminary result)   Collection Time: 09/21/17 10:40 AM  Result Value Ref Range Status   Specimen Description   Final    RIGHT ANTECUBITAL BOTTLES DRAWN AEROBIC AND ANAEROBIC   Special Requests Blood Culture adequate volume  Final   Culture   Final    NO GROWTH < 12 HOURS Performed at [facility], [address], [place], [place] [address]    Report Status PENDING  Incomplete  Culture, blood (routine x 2)     Status: None (Preliminary result)   Collection Time: 09/21/17 12:52 PM  Result Value Ref Range Status   Specimen Description   Final    RIGHT ANTECUBITAL BOTTLES DRAWN AEROBIC AND ANAEROBIC   Special Requests Blood Culture adequate volume  Final   Culture   Final    NO GROWTH < 12 HOURS Performed at [facility], [address] [place], [place] [address]    Report Status PENDING  Incomplete      Lab Results  Component Value Date   CALCIUM 7.4 (L) 09/21/2017   CAION 1.06 (L) 09/18/2017   PHOS 4.8 (H) 06/01/2017      Impression: 1)Renal ESRD on HD               Pt is Monday/Wednesday/ Friday schedule              Will dialyze pt today  2)HTN  Medication-  On Calcium Channel Blockers On Beta blockers On Vasodilators-   3)Anemia In ESRD the goal for HGb is 9--11( on ESA)  4)CKD Mineral-Bone Disorder  Phosphorus at goal.     On binders Calcium is not at goal.    Hypocalcemia-HD should help   5)ID-admitted with low grade temp Blood cultures drawn Primary MD following  6)Electrolytes Normokalemic NOrmonatremic   7)Acid base Co2 at goal  8) Access-Pt is s/p angioplasty   No  signs of local infection       Plan:  Will dialyze today Will use 2k/2.5 calium bath Will try to take 2 liters off NO need of ESA      Jamya Starry S 09/21/2017, 4:46 PM

## 2017-09-21 NOTE — ED Notes (Signed)
CRITICAL VALUE ALERT  Critical Value:mtroponin 0.04  Date & Time Notied:  09/22/2017 @ 1024  Provider Notified: tammy triplett PA  Orders Received/Actions taken: n/a

## 2017-09-21 NOTE — Plan of Care (Signed)
No anxiety noted at this time. Will continue to monitor.

## 2017-09-21 NOTE — ED Notes (Signed)
Gave pt a drink to raise CBG.

## 2017-09-22 ENCOUNTER — Observation Stay (HOSPITAL_COMMUNITY): Payer: BLUE CROSS/BLUE SHIELD

## 2017-09-22 DIAGNOSIS — Z992 Dependence on renal dialysis: Secondary | ICD-10-CM

## 2017-09-22 DIAGNOSIS — R6883 Chills (without fever): Secondary | ICD-10-CM

## 2017-09-22 DIAGNOSIS — N186 End stage renal disease: Secondary | ICD-10-CM | POA: Diagnosis not present

## 2017-09-22 DIAGNOSIS — I358 Other nonrheumatic aortic valve disorders: Secondary | ICD-10-CM | POA: Diagnosis not present

## 2017-09-22 DIAGNOSIS — R509 Fever, unspecified: Secondary | ICD-10-CM | POA: Diagnosis not present

## 2017-09-22 DIAGNOSIS — R531 Weakness: Secondary | ICD-10-CM

## 2017-09-22 DIAGNOSIS — I5032 Chronic diastolic (congestive) heart failure: Secondary | ICD-10-CM | POA: Diagnosis not present

## 2017-09-22 DIAGNOSIS — D72829 Elevated white blood cell count, unspecified: Secondary | ICD-10-CM | POA: Diagnosis not present

## 2017-09-22 DIAGNOSIS — D638 Anemia in other chronic diseases classified elsewhere: Secondary | ICD-10-CM | POA: Diagnosis not present

## 2017-09-22 DIAGNOSIS — R0989 Other specified symptoms and signs involving the circulatory and respiratory systems: Secondary | ICD-10-CM | POA: Diagnosis not present

## 2017-09-22 LAB — CBC WITH DIFFERENTIAL/PLATELET
BASOS ABS: 0 10*3/uL (ref 0.0–0.1)
Basophils Relative: 0 %
Eosinophils Absolute: 0.4 10*3/uL (ref 0.0–0.7)
Eosinophils Relative: 6 %
HCT: 41.2 % (ref 36.0–46.0)
HEMOGLOBIN: 12.9 g/dL (ref 12.0–15.0)
Lymphocytes Relative: 12 %
Lymphs Abs: 0.8 10*3/uL (ref 0.7–4.0)
MCH: 31.2 pg (ref 26.0–34.0)
MCHC: 31.3 g/dL (ref 30.0–36.0)
MCV: 99.8 fL (ref 78.0–100.0)
Monocytes Absolute: 0.8 10*3/uL (ref 0.1–1.0)
Monocytes Relative: 12 %
Neutro Abs: 4.9 10*3/uL (ref 1.7–7.7)
Neutrophils Relative %: 70 %
Platelets: 96 10*3/uL — ABNORMAL LOW (ref 150–400)
RBC: 4.13 MIL/uL (ref 3.87–5.11)
RDW: 14.3 % (ref 11.5–15.5)
WBC: 6.9 10*3/uL (ref 4.0–10.5)

## 2017-09-22 LAB — RENAL FUNCTION PANEL
Albumin: 3.3 g/dL — ABNORMAL LOW (ref 3.5–5.0)
Anion gap: 11 (ref 5–15)
BUN: 25 mg/dL — ABNORMAL HIGH (ref 8–23)
CO2: 27 mmol/L (ref 22–32)
Calcium: 8.3 mg/dL — ABNORMAL LOW (ref 8.9–10.3)
Chloride: 100 mmol/L (ref 98–111)
Creatinine, Ser: 5.4 mg/dL — ABNORMAL HIGH (ref 0.44–1.00)
GFR calc Af Amer: 9 mL/min — ABNORMAL LOW (ref >60)
GFR, EST NON AFRICAN AMERICAN: 8 mL/min — AB (ref >60)
Glucose, Bld: 54 mg/dL — ABNORMAL LOW (ref 70–99)
POTASSIUM: 4.5 mmol/L (ref 3.5–5.1)
Phosphorus: 5.4 mg/dL — ABNORMAL HIGH (ref 2.5–4.6)
Sodium: 138 mmol/L (ref 135–145)

## 2017-09-22 LAB — GLUCOSE, CAPILLARY
GLUCOSE-CAPILLARY: 94 mg/dL (ref 70–99)
GLUCOSE-CAPILLARY: 65 mg/dL — AB (ref 70–99)
GLUCOSE-CAPILLARY: 154 mg/dL — AB (ref 70–99)
Glucose-Capillary: 158 mg/dL — ABNORMAL HIGH (ref 70–99)

## 2017-09-22 MED ORDER — DOXYCYCLINE HYCLATE 100 MG PO CAPS: 100.0000 mg | ORAL_CAPSULE | Freq: Two times a day (BID) | ORAL | 0 refills | Status: AC

## 2017-09-22 NOTE — Progress Notes (Signed)
09/22/2017 10:43 AM  I spoke with the nurse and she said the patient's morning blood sugar was 159.  Murvin Natal, MD

## 2017-09-22 NOTE — Discharge Summary (Signed)
Physician Discharge Summary  [name] DJS:[id number] DOB: [birth year] DOA: 09/21/2017  PCP: Sharion Balloon, FNP  Admit date: 09/21/2017 Discharge date: 09/22/2017  Admitted From: Home  Disposition: Home   Recommendations for Outpatient Follow-up:  1. Follow up with PCP in 1 weeks 2. Resume regular hemodialysis schedule 3. Follow up with nephrology in 1 week 4. Please obtain BMP/CBC in 1 week 5. Please follow up on the following pending results: final culture data   Discharge Condition: STABLE   CODE STATUS: FULL    Brief Hospitalization Summary: Please see all hospital notes, images, labs for full details of the hospitalization.  HPI: [name] is a 62 y.o. female end-stage renal disease on dialysis x1 year in [place] receives treatments MWF was just discharged from [place] on 09/18/2017 after having an elective angioplasty by Dr. Trula Slade.  She was having poor function of her dialysis access.  At that time it is noted that she was seen by nephrology for an urgent hemodialysis session secondary to hyperkalemia which was corrected after treatment.  She apparently had been doing fairly well after being discharged.  Her last hemodialysis was Tuesday and she was scheduled for hemodialysis this morning.  She called her dialysis center and was told to come to the ED for evaluation because she was complaining of fever and nausea.  The patient was seen in the emergency department today and noted to have a temperature of 100.4.  The patient was noted to have a mildly elevated white blood cell count at 13.0.  The patient had signs of pulmonary vascular congestion and pulmonary edema on chest x-ray.  She was complaining of nausea but no emesis.  No diarrhea.  No chest pain and no shortness of breath symptoms.  His abdominal pain, diarrhea.  She does have generalized weakness.  Her potassium is within normal limits at this time.  She is being admitted for observation due to recent procedure now  presenting with low-grade fever and elevated white blood cell count.  She does need to have hemodialysis today and we will consult nephrology for assistance.  The patient was admitted to the hospital under observation.  She had pulmonary edema and needed hemodialysis.  Nephrology was consulted and made arrangements for hemodialysis for the patient.  She tolerated it very well.  Her sutures were removed by the nephrology team.  The patient did very well.  Her white count has normalized.  She had no fever or chills.  She is going to be discharged home today on 5 days of oral doxycycline.  She should resume regular hemodialysis as scheduled.  Discharge Diagnoses:  Active Problems:   Hypertension associated with diabetes ([place])   Chronic diastolic heart failure (HCC)   CKD (chronic kidney disease) requiring chronic dialysis (HCC)   Diabetes mellitus ([place])   Aortic valve sclerosis   Anemia of chronic disease   Hyperlipidemia associated with type 2 diabetes mellitus (HCC)   Nausea   Leukocytosis   Mild fever   Chills   Generalized weakness  Discharge Instructions: Discharge Instructions    Call MD for:  difficulty breathing, headache or visual disturbances   Complete by:  As directed    Call MD for:  extreme fatigue   Complete by:  As directed    Call MD for:  hives   Complete by:  As directed    Call MD for:  persistant dizziness or light-headedness   Complete by:  As directed    Call MD  for:  persistant nausea and vomiting   Complete by:  As directed    Call MD for:  redness, tenderness, or signs of infection (pain, swelling, redness, odor or green/yellow discharge around incision site)   Complete by:  As directed    Diet - low sodium heart healthy   Complete by:  As directed    Increase activity slowly   Complete by:  As directed      Allergies as of 09/22/2017      Reactions   Other Other (See Comments)   Tape Bruises and tears skin, Paper tape tolerated      Medication List     TAKE these medications   acetaminophen 500 MG tablet Commonly known as:  TYLENOL Take 500 mg by mouth every 6 (six) hours as needed for moderate pain or headache.   amLODipine 10 MG tablet Commonly known as:  NORVASC TAKE 10 MG BY MOUTH NIGHTLY   ASPIR-LOW 81 MG EC tablet Generic drug:  aspirin Take 81 mg by mouth daily.   doxycycline 100 MG capsule Commonly known as:  VIBRAMYCIN Take 1 capsule (100 mg total) by mouth 2 (two) times daily for 5 days.   insulin degludec 100 UNIT/ML Sopn FlexTouch Pen Commonly known as:  TRESIBA FLEXTOUCH Inject 0.13 mLs (13 Units total) into the skin daily at 10 pm.   isosorbide mononitrate 60 MG 24 hr tablet Commonly known as:  IMDUR Take 1 tablet (60 mg total) by mouth daily.   lidocaine-prilocaine cream Commonly known as:  EMLA Apply 1 application topically as needed (for dialysis).   metoprolol tartrate 25 MG tablet Commonly known as:  LOPRESSOR Take 12.5 mg by mouth 2 (two) times daily.   ondansetron 4 MG disintegrating tablet Commonly known as:  ZOFRAN-ODT Take 1 tablet (4 mg total) by mouth every 8 (eight) hours as needed for nausea or vomiting.   predniSONE 5 MG tablet Commonly known as:  DELTASONE Take 1 tablet (5 mg total) by mouth daily with breakfast.   PROGRAF 0.5 MG capsule Generic drug:  tacrolimus Take 0.5-1 mg by mouth See admin instructions. Take 1 mg by mouth in the morning and take 0.5 mg by mouth at bedtime   rosuvastatin 20 MG tablet Commonly known as:  CRESTOR Take 1 tablet (20 mg total) by mouth daily.   sevelamer carbonate 800 MG tablet Commonly known as:  RENVELA Take 3200 mg by mouth 3 times daily with meals and take 1600 mg by mouth with snacks   TRULICITY 1.5 PY/1.9JK Sopn Generic drug:  Dulaglutide INJECT 1.5 MG INTO THE SKIN ONCE A WEEK What changed:  See the new instructions.      Follow-up Information    Sharion Balloon, FNP. Schedule an appointment as soon as possible for a visit in 1  week(s).   Specialty:  Family Medicine Contact information: [place] [address] [phone]        Fran Lowes, MD. Schedule an appointment as soon as possible for a visit in 1 week(s).   Specialty:  Nephrology Contact information: [address] [place] [address] [phone]          Allergies  Allergen Reactions  . Other Other (See Comments)    Tape Bruises and tears skin, Paper tape tolerated   Allergies as of 09/22/2017      Reactions   Other Other (See Comments)   Tape Bruises and tears skin, Paper tape tolerated  Medication List    TAKE these medications   acetaminophen 500 MG tablet Commonly known as:  TYLENOL Take 500 mg by mouth every 6 (six) hours as needed for moderate pain or headache.   amLODipine 10 MG tablet Commonly known as:  NORVASC TAKE 10 MG BY MOUTH NIGHTLY   ASPIR-LOW 81 MG EC tablet Generic drug:  aspirin Take 81 mg by mouth daily.   doxycycline 100 MG capsule Commonly known as:  VIBRAMYCIN Take 1 capsule (100 mg total) by mouth 2 (two) times daily for 5 days.   insulin degludec 100 UNIT/ML Sopn FlexTouch Pen Commonly known as:  TRESIBA FLEXTOUCH Inject 0.13 mLs (13 Units total) into the skin daily at 10 pm.   isosorbide mononitrate 60 MG 24 hr tablet Commonly known as:  IMDUR Take 1 tablet (60 mg total) by mouth daily.   lidocaine-prilocaine cream Commonly known as:  EMLA Apply 1 application topically as needed (for dialysis).   metoprolol tartrate 25 MG tablet Commonly known as:  LOPRESSOR Take 12.5 mg by mouth 2 (two) times daily.   ondansetron 4 MG disintegrating tablet Commonly known as:  ZOFRAN-ODT Take 1 tablet (4 mg total) by mouth every 8 (eight) hours as needed for nausea or vomiting.   predniSONE 5 MG tablet Commonly known as:  DELTASONE Take 1 tablet (5 mg total) by mouth daily with breakfast.   PROGRAF 0.5 MG capsule Generic drug:  tacrolimus Take 0.5-1 mg by  mouth See admin instructions. Take 1 mg by mouth in the morning and take 0.5 mg by mouth at bedtime   rosuvastatin 20 MG tablet Commonly known as:  CRESTOR Take 1 tablet (20 mg total) by mouth daily.   sevelamer carbonate 800 MG tablet Commonly known as:  RENVELA Take 3200 mg by mouth 3 times daily with meals and take 1600 mg by mouth with snacks   TRULICITY 1.5 ZO/1.0RU Sopn Generic drug:  Dulaglutide INJECT 1.5 MG INTO THE SKIN ONCE A WEEK What changed:  See the new instructions.       Procedures/Studies: Portable Chest 1 View  Result Date: 09/22/2017 CLINICAL DATA:  Fever. EXAM: PORTABLE CHEST 1 VIEW COMPARISON:  Yesterday. FINDINGS: Stable enlarged cardiac silhouette. Mild decrease in prominence of the pulmonary vasculature and interstitial markings. No pleural fluid. No airspace consolidation. Unremarkable bones. IMPRESSION: Stable cardiomegaly with improved pulmonary vascular congestion and interstitial pulmonary edema. Electronically Signed   By: Claudie Revering M.D.   On: 09/22/2017 09:20   Dg Chest Portable 1 View  Result Date: 09/21/2017 CLINICAL DATA:  Sudden onset of nausea chills and headache since 02/30 this morning. EXAM: PORTABLE CHEST 1 VIEW COMPARISON:  06/15/2017 FINDINGS: The heart is enlarged but stable. Moderate vascular congestion and possible mild interstitial edema. No infiltrates or effusions. The bony thorax is intact. IMPRESSION: Cardiac enlargement and moderate vascular congestion with possible mild interstitial edema. No infiltrates or effusions. Electronically Signed   By: Marijo Sanes M.D.   On: 09/21/2017 09:42     Subjective: Patient says she is feeling much better and she really wants to go home today.  She says she will resume hemodialysis on Monday as scheduled.  She has no chest pain or shortness of breath and no fever or chills.  Discharge Exam: Vitals:   09/22/17 0030 09/22/17 0625  BP: (!) 160/56 (!) 139/55  Pulse: (!) 50 (!) 57  Resp: 16 16   Temp: 98.9 F (37.2 C) 98.3 F (36.8 C)  SpO2: 100% 98%  Vitals:   09/21/17 2345 09/22/17 0015 09/22/17 0030 09/22/17 0625  BP: (!) 156/56 (!) 150/61 (!) 160/56 (!) 139/55  Pulse: (!) 50 (!) 52 (!) 50 (!) 57  Resp:   16 16  Temp:   98.9 F (37.2 C) 98.3 F (36.8 C)  TempSrc:   Oral Oral  SpO2:   100% 98%  Weight:   64 kg (141 lb 1.5 oz) 65.1 kg (143 lb 8.3 oz)  Height:        General exam: Chronically ill-appearing female, moderately built and nourished patient, lying comfortably supine in no obvious distress.  Head, eyes and ENT: Nontraumatic and normocephalic. Pupils equally reacting to light and accommodation. Oral mucosa and dry.  Neck: Supple. No JVD, carotid bruit or thyromegaly.  Lymphatics: No lymphadenopathy.  Respiratory system: Bibasilar crackles.  No increased work of breathing.  Cardiovascular system: S1 and S2 heard with harsh holosystolic murmur.    Gastrointestinal system: Abdomen is nondistended, soft and nontender. Normal bowel sounds heard. No organomegaly or masses appreciated.  Central nervous system: Alert and oriented. No focal neurological deficits.  Extremities: Symmetric 5 x 5 power. Peripheral pulses symmetrically felt. AVF Left thrill and bruit no s/s of infection seen.   Skin: No rashes or acute findings.  Musculoskeletal system: Negative exam.  Trace pretibial edema bilateral lower extremities.  Psychiatry: Pleasant and cooperative.   The results of significant diagnostics from this hospitalization (including imaging, microbiology, ancillary and laboratory) are listed below for reference.     Microbiology: Recent Results (from the past 240 hour(s))  Culture, blood (routine x 2)     Status: None (Preliminary result)   Collection Time: 09/21/17 10:40 AM  Result Value Ref Range Status   Specimen Description   Final    RIGHT ANTECUBITAL BOTTLES DRAWN AEROBIC AND ANAEROBIC   Special Requests Blood Culture adequate volume  Final    Culture   Final    NO GROWTH < 24 HOURS Performed at [facility], [address] [place], [place] [address]    Report Status PENDING  Incomplete  Culture, blood (routine x 2)     Status: None (Preliminary result)   Collection Time: 09/21/17 12:52 PM  Result Value Ref Range Status   Specimen Description   Final    RIGHT ANTECUBITAL BOTTLES DRAWN AEROBIC AND ANAEROBIC   Special Requests Blood Culture adequate volume  Final   Culture   Final    NO GROWTH < 24 HOURS Performed at [facility], [address] [place], [place] [address]    Report Status PENDING  Incomplete     Labs: BNP (last 3 results) No results for input(s): BNP in the last 8760 hours. Basic Metabolic Panel: Recent Labs  Lab 09/18/17 1156  09/18/17 1736 09/18/17 1746 09/18/17 2118 09/21/17 0937 09/22/17 0713  NA 139  --  138  --   --  136 138  K 6.3*   < > 6.9* 6.9* 3.5 4.3 4.5  CL 104  --  106  --   --  94* 100  CO2  --   --   --   --   --  27 27  GLUCOSE 85  --  197*  --   --  65* 54*  BUN 68*  --  72*  --   --  73* 25*  CREATININE 9.40*  --  9.80*  --   --  10.29* 5.40*  CALCIUM  --   --   --   --   --  7.4* 8.3*  MG  --   --   --   --   --  2.4  --   PHOS  --   --   --   --   --   --  5.4*   < > = values in this interval not displayed.   Liver Function Tests: Recent Labs  Lab 09/21/17 0937 09/22/17 0713  AST 19  --   ALT 18  --   ALKPHOS 153*  --   BILITOT 0.6  --   PROT 7.6  --   ALBUMIN 3.7 3.3*   No results for input(s): LIPASE, AMYLASE in the last 168 hours. No results for input(s): AMMONIA in the last 168 hours. CBC: Recent Labs  Lab 09/18/17 1156 09/18/17 1736 09/21/17 0937 09/22/17 0713  WBC  --   --  13.0* 6.9  NEUTROABS  --   --  10.5* 4.9  HGB 12.2 15.0 12.3 12.9  HCT 36.0 44.0 38.8 41.2  MCV  --   --  97.7 99.8  PLT  --   --  152 96*   Cardiac Enzymes: Recent Labs  Lab 09/21/17 0937 09/21/17 1253  TROPONINI 0.04* 0.06*   BNP: Invalid input(s):  POCBNP CBG: Recent Labs  Lab 09/18/17 1104 09/21/17 1248 09/21/17 1420 09/21/17 1608  GLUCAP 84 66* 79 160*   D-Dimer No results for input(s): DDIMER in the last 72 hours. Hgb A1c No results for input(s): HGBA1C in the last 72 hours. Lipid Profile No results for input(s): CHOL, HDL, LDLCALC, TRIG, CHOLHDL, LDLDIRECT in the last 72 hours. Thyroid function studies No results for input(s): TSH, T4TOTAL, T3FREE, THYROIDAB in the last 72 hours.  Invalid input(s): FREET3 Anemia work up No results for input(s): VITAMINB12, FOLATE, FERRITIN, TIBC, IRON, RETICCTPCT in the last 72 hours. Urinalysis    Component Value Date/Time   COLORURINE AMBER (A) 06/04/2017 1750   APPEARANCEUR CLOUDY (A) 06/04/2017 1750   APPEARANCEUR Clear 06/04/2017 1436   LABSPEC 1.024 06/04/2017 1750   PHURINE 5.0 06/04/2017 1750   GLUCOSEU NEGATIVE 06/04/2017 1750   HGBUR NEGATIVE 06/04/2017 1750   BILIRUBINUR SMALL (A) 06/04/2017 1750   BILIRUBINUR Negative 06/04/2017 1436   KETONESUR 5 (A) 06/04/2017 1750   PROTEINUR >=300 (A) 06/04/2017 1750   NITRITE NEGATIVE 06/04/2017 1750   LEUKOCYTESUR TRACE (A) 06/04/2017 1750   LEUKOCYTESUR Negative 06/04/2017 1436   Sepsis Labs Invalid input(s): PROCALCITONIN,  WBC,  LACTICIDVEN Microbiology Recent Results (from the past 240 hour(s))  Culture, blood (routine x 2)     Status: None (Preliminary result)   Collection Time: 09/21/17 10:40 AM  Result Value Ref Range Status   Specimen Description   Final    RIGHT ANTECUBITAL BOTTLES DRAWN AEROBIC AND ANAEROBIC   Special Requests Blood Culture adequate volume  Final   Culture   Final    NO GROWTH < 24 HOURS Performed at [facility], [address] [place], [place] [address]    Report Status PENDING  Incomplete  Culture, blood (routine x 2)     Status: None (Preliminary result)   Collection Time: 09/21/17 12:52 PM  Result Value Ref Range Status   Specimen Description   Final    RIGHT ANTECUBITAL BOTTLES  DRAWN AEROBIC AND ANAEROBIC   Special Requests Blood Culture adequate volume  Final   Culture   Final    NO GROWTH < 24 HOURS Performed at [facility], [address] [place], [place] [address]    Report Status  PENDING  Incomplete     Time coordinating discharge:   SIGNED:  Irwin Brakeman, MD  [facility] 09/22/2017, 10:52 AM Pager [phone]  If 7PM-7AM, please contact night-coverage [email or web address] Password [email or web address]

## 2017-09-22 NOTE — Progress Notes (Signed)
[name]  MRN: [id number]  DOB/AGE: [birth year] 62 y.o.  Primary Care Physician:Hawks, Theador Hawthorne, FNP  Admit date: 09/21/2017  Chief Complaint:  Chief Complaint  Patient presents with  . Nausea    S-Pt presented on  09/21/2017 with  Chief Complaint  Patient presents with  . Nausea  .    Pt today feels better. Pt main comment was " I want to go home"   Meds . amLODipine  10 mg Oral Daily  . aspirin EC  81 mg Oral Daily  . Chlorhexidine Gluconate Cloth  6 each Topical Q0600  . heparin  5,000 Units Subcutaneous Q8H  . insulin aspart  0-9 Units Subcutaneous TID WC  . insulin glargine  8 Units Subcutaneous QHS  . isosorbide mononitrate  60 mg Oral Daily  . metoprolol tartrate  12.5 mg Oral BID  . predniSONE  5 mg Oral Q breakfast  . rosuvastatin  20 mg Oral Daily  . sevelamer carbonate  3,200 mg Oral TID WC  . tacrolimus  0.5 mg Oral QHS  . tacrolimus  1 mg Oral q morning - 10a       Physical Exam: Vital signs in last 24 hours: Temp:  [97.4 F (36.3 C)-98.9 F (37.2 C)] 98.3 F (36.8 C) (07/20 0625) Pulse Rate:  [47-67] 57 (07/20 0625) Resp:  [12-19] 16 (07/20 0625) BP: (116-160)/(42-63) 139/55 (07/20 0625) SpO2:  [91 %-100 %] 98 % (07/20 0625) Weight:  [141 lb 1.5 oz (64 kg)-145 lb 15.1 oz (66.2 kg)] 143 lb 8.3 oz (65.1 kg) (07/20 0625) Weight change:  Last BM Date: 09/20/17  Intake/Output from previous day: 07/19 0701 - 07/20 0700 In: 860.8 [P.O.:240; I.V.:620.8] Out: 2000  No intake/output data recorded.   Physical Exam: General- pt is awake,alert, oriented to time place and person Resp- No acute REsp distress,  NO Rhonchi CVS- S1S2 regular in rate and rhythm GIT- BS+, soft, NT, ND EXT- NO LE Edema, Cyanosis Access- left AVF  Lab Results: CBC Recent Labs    09/21/17 0937 09/22/17 0713  WBC 13.0* 6.9  HGB 12.3 12.9  HCT 38.8 41.2  PLT 152 96*    BMET Recent Labs    09/21/17 0937 09/22/17 0713  NA 136 138  K 4.3 4.5  CL 94* 100   CO2 27 27  GLUCOSE 65* 54*  BUN 73* 25*  CREATININE 10.29* 5.40*  CALCIUM 7.4* 8.3*    MICRO Recent Results (from the past 240 hour(s))  Culture, blood (routine x 2)     Status: None (Preliminary result)   Collection Time: 09/21/17 10:40 AM  Result Value Ref Range Status   Specimen Description   Final    RIGHT ANTECUBITAL BOTTLES DRAWN AEROBIC AND ANAEROBIC   Special Requests Blood Culture adequate volume  Final   Culture   Final    NO GROWTH < 24 HOURS Performed at [facility], [address], [place], [place] [address]    Report Status PENDING  Incomplete  Culture, blood (routine x 2)     Status: None (Preliminary result)   Collection Time: 09/21/17 12:52 PM  Result Value Ref Range Status   Specimen Description   Final    RIGHT ANTECUBITAL BOTTLES DRAWN AEROBIC AND ANAEROBIC   Special Requests Blood Culture adequate volume  Final   Culture   Final    NO GROWTH < 24 HOURS Performed at [facility], [address], [place], [place] [address]    Report Status PENDING  Incomplete  Lab Results  Component Value Date   CALCIUM 8.3 (L) 09/22/2017   CAION 1.06 (L) 09/18/2017   PHOS 5.4 (H) 09/22/2017   Alb 3.3 Corrected calcium  8.3+0.4=8.7            Impression: 1)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     On binders Calcium is now at goal.    5)ID-admitted with low grade temp Blood cultures drawn Primary MD following  6)Electrolytes Normokalemic NOrmonatremic   7)Acid base Co2 at goal  8) Access-Pt is s/p angioplasty   No signs of local infection   Sutures removed yesterday       Plan:  Will continue current tx NO need for HD  today    Roneka Gilpin S 09/22/2017, 9:31 AM

## 2017-09-22 NOTE — Discharge Instructions (Signed)
Please resume regular hemodialysis schedule on Monday. Seek medical care or return to ER if symptoms worsen or new problems develop.  Pulmonary Edema Pulmonary edema is abnormal fluid buildup in the lungs that can make it hard to breathe. Follow these instructions at home:  Talk to your doctor about an exercise program.  Eat a healthy diet: ? Eat fresh fruits, vegetables, and lean meats. ? Limit high fat and salty foods. ? Avoid processed, canned, or fried foods. ? Avoid fast food.  Follow your doctor's advice about taking medicine and recording the medicine you take.  Follow your doctor's advice about keeping a record of your weight.  Talk to your doctor about keeping track of your blood pressure.  Do not smoke.  Do not use nicotine patches or nicotine gum.  Make a follow-up appointment with your doctor.  Ask your doctor for a copy of your latest heart tracing (ECG) and keep a copy with you at all times. Get help right away if:  You have chest pain. THIS IS AN EMERGENCY. Do not wait to see if the pain will go away. Call for local emergency medical help. Do not drive yourself to the hospital.  You have sweating, feel sick to your stomach (nauseous), or are experiencing shortness of breath.  Your weight increases more than your doctor tells you it should.  You start to have shortness of breath.  You notice more swelling in your hands, feet, ankles, or belly.  You have dizziness, blurred vision, headache, or unsteadiness that does not go away.  You cough up bloody spit.  You have a cough that does not go away.  You are unable to sleep because it is hard to breathe.  You begin to feel a jumping or fluttering sensation (palpitations) in the chest that is unusual for you. This information is not intended to replace advice given to you by your health care provider. Make sure you discuss any questions you have with your health care provider. Document Released:  02/08/2009 Document Revised: 07/29/2015 Document Reviewed: 10/28/2012 Elsevier Interactive Patient Education  [employer].

## 2017-09-22 NOTE — Procedures (Signed)
     HEMODIALYSIS TREATMENT NOTE:   3.5 hour low-heparin dialysis completed via left forearm AVF.  Goal met: 2L removed, no interruption in ultrafiltration.  All blood was returned.  Hemostasis was achieved in 10 minutes.  [name], RN, CDN

## 2017-09-22 NOTE — Progress Notes (Signed)
Patient is to be discharged home and in stable condition. Patient's IV removed, WNL. Patient given discharge instructions and verbalized understanding. All questions addressed and answered. Patient awaiting transportation at this time and will be escorted out by staff via wheelchair when present.  Celestia Khat, RN

## 2017-09-23 LAB — HEPATITIS B SURFACE ANTIGEN: Hepatitis B Surface Ag: NEGATIVE

## 2017-09-24 DIAGNOSIS — Z992 Dependence on renal dialysis: Secondary | ICD-10-CM | POA: Diagnosis not present

## 2017-09-24 DIAGNOSIS — N186 End stage renal disease: Secondary | ICD-10-CM | POA: Diagnosis not present

## 2017-09-25 ENCOUNTER — Encounter: Payer: Self-pay | Admitting: Family

## 2017-09-25 ENCOUNTER — Ambulatory Visit: Payer: BLUE CROSS/BLUE SHIELD | Admitting: Family

## 2017-09-25 VITALS — BP 131/61 | HR 57 | Temp 97.2°F | Ht 69.0 in | Wt 142.2 lb

## 2017-09-25 DIAGNOSIS — E785 Hyperlipidemia, unspecified: Secondary | ICD-10-CM

## 2017-09-25 DIAGNOSIS — Z09 Encounter for follow-up examination after completed treatment for conditions other than malignant neoplasm: Secondary | ICD-10-CM

## 2017-09-25 DIAGNOSIS — E1169 Type 2 diabetes mellitus with other specified complication: Secondary | ICD-10-CM

## 2017-09-25 DIAGNOSIS — N186 End stage renal disease: Secondary | ICD-10-CM

## 2017-09-25 DIAGNOSIS — I5032 Chronic diastolic (congestive) heart failure: Secondary | ICD-10-CM

## 2017-09-25 DIAGNOSIS — E1165 Type 2 diabetes mellitus with hyperglycemia: Secondary | ICD-10-CM

## 2017-09-25 DIAGNOSIS — I10 Essential (primary) hypertension: Secondary | ICD-10-CM

## 2017-09-25 DIAGNOSIS — Z794 Long term (current) use of insulin: Secondary | ICD-10-CM

## 2017-09-25 DIAGNOSIS — I152 Hypertension secondary to endocrine disorders: Secondary | ICD-10-CM

## 2017-09-25 DIAGNOSIS — E1159 Type 2 diabetes mellitus with other circulatory complications: Secondary | ICD-10-CM

## 2017-09-25 DIAGNOSIS — I358 Other nonrheumatic aortic valve disorders: Secondary | ICD-10-CM | POA: Diagnosis not present

## 2017-09-25 DIAGNOSIS — Z992 Dependence on renal dialysis: Secondary | ICD-10-CM

## 2017-09-25 LAB — BAYER DCA HB A1C WAIVED: HB A1C (BAYER DCA - WAIVED): 6.4 % (ref <7.0)

## 2017-09-25 MED ORDER — DULAGLUTIDE 1.5 MG/0.5ML ~~LOC~~ SOAJ: SUBCUTANEOUS | 4 refills | Status: DC

## 2017-09-25 MED ORDER — AMLODIPINE BESYLATE 10 MG PO TABS: ORAL_TABLET | ORAL | 4 refills | Status: DC

## 2017-09-25 MED ORDER — METOPROLOL TARTRATE 25 MG PO TABS: 12.5000 mg | ORAL_TABLET | Freq: Two times a day (BID) | ORAL | 3 refills | Status: DC

## 2017-09-25 MED ORDER — ROSUVASTATIN CALCIUM 20 MG PO TABS: 20.0000 mg | ORAL_TABLET | Freq: Every day | ORAL | 3 refills | Status: DC

## 2017-09-25 MED ORDER — INSULIN DEGLUDEC 100 UNIT/ML ~~LOC~~ SOPN: 8.0000 [IU] | PEN_INJECTOR | Freq: Every day | SUBCUTANEOUS | 2 refills | Status: DC

## 2017-09-25 NOTE — Patient Instructions (Signed)
Diabetes Mellitus and Nutrition When you have diabetes (diabetes mellitus), it is very important to have healthy eating habits because your blood sugar (glucose) levels are greatly affected by what you eat and drink. Eating healthy foods in the appropriate amounts, at about the same times every day, can help you:  Control your blood glucose.  Lower your risk of heart disease.  Improve your blood pressure.  Reach or maintain a healthy weight.  Every person with diabetes is different, and each person has different needs for a meal plan. Your health care provider may recommend that you work with a diet and nutrition specialist (dietitian) to make a meal plan that is best for you. Your meal plan may vary depending on factors such as:  The calories you need.  The medicines you take.  Your weight.  Your blood glucose, blood pressure, and cholesterol levels.  Your activity level.  Other health conditions you have, such as heart or kidney disease.  How do carbohydrates affect me? Carbohydrates affect your blood glucose level more than any other type of food. Eating carbohydrates naturally increases the amount of glucose in your blood. Carbohydrate counting is a method for keeping track of how many carbohydrates you eat. Counting carbohydrates is important to keep your blood glucose at a healthy level, especially if you use insulin or take certain oral diabetes medicines. It is important to know how many carbohydrates you can safely have in each meal. This is different for every person. Your dietitian can help you calculate how many carbohydrates you should have at each meal and for snack. Foods that contain carbohydrates include:  Bread, cereal, rice, pasta, and crackers.  Potatoes and corn.  Peas, beans, and lentils.  Milk and yogurt.  Fruit and juice.  Desserts, such as cakes, cookies, ice cream, and candy.  How does alcohol affect me? Alcohol can cause a sudden decrease in blood  glucose (hypoglycemia), especially if you use insulin or take certain oral diabetes medicines. Hypoglycemia can be a life-threatening condition. Symptoms of hypoglycemia (sleepiness, dizziness, and confusion) are similar to symptoms of having too much alcohol. If your health care provider says that alcohol is safe for you, follow these guidelines:  Limit alcohol intake to no more than 1 drink per day for nonpregnant women and 2 drinks per day for men. One drink equals 12 oz of beer, 5 oz of wine, or 1 oz of hard liquor.  Do not drink on an empty stomach.  Keep yourself hydrated with water, diet soda, or unsweetened iced tea.  Keep in mind that regular soda, juice, and other mixers may contain a lot of sugar and must be counted as carbohydrates.  What are tips for following this plan? Reading food labels  Start by checking the serving size on the label. The amount of calories, carbohydrates, fats, and other nutrients listed on the label are based on one serving of the food. Many foods contain more than one serving per package.  Check the total grams (g) of carbohydrates in one serving. You can calculate the number of servings of carbohydrates in one serving by dividing the total carbohydrates by 15. For example, if a food has 30 g of total carbohydrates, it would be equal to 2 servings of carbohydrates.  Check the number of grams (g) of saturated and trans fats in one serving. Choose foods that have low or no amount of these fats.  Check the number of milligrams (mg) of sodium in one serving. Most people   should limit total sodium intake to less than 2,300 mg per day.  Always check the nutrition information of foods labeled as "low-fat" or "nonfat". These foods may be higher in added sugar or refined carbohydrates and should be avoided.  Talk to your dietitian to identify your daily goals for nutrients listed on the label. Shopping  Avoid buying canned, premade, or processed foods. These  foods tend to be high in fat, sodium, and added sugar.  Shop around the outside edge of the grocery store. This includes fresh fruits and vegetables, bulk grains, fresh meats, and fresh dairy. Cooking  Use low-heat cooking methods, such as baking, instead of high-heat cooking methods like deep frying.  Cook using healthy oils, such as olive, canola, or sunflower oil.  Avoid cooking with butter, cream, or high-fat meats. Meal planning  Eat meals and snacks regularly, preferably at the same times every day. Avoid going long periods of time without eating.  Eat foods high in fiber, such as fresh fruits, vegetables, beans, and whole grains. Talk to your dietitian about how many servings of carbohydrates you can eat at each meal.  Eat 4-6 ounces of lean protein each day, such as lean meat, chicken, fish, eggs, or tofu. 1 ounce is equal to 1 ounce of meat, chicken, or fish, 1 egg, or 1/4 cup of tofu.  Eat some foods each day that contain healthy fats, such as avocado, nuts, seeds, and fish. Lifestyle   Check your blood glucose regularly.  Exercise at least 30 minutes 5 or more days each week, or as told by your health care provider.  Take medicines as told by your health care provider.  Do not use any products that contain nicotine or tobacco, such as cigarettes and e-cigarettes. If you need help quitting, ask your health care provider.  Work with a counselor or diabetes educator to identify strategies to manage stress and any emotional and social challenges. What are some questions to ask my health care provider?  Do I need to meet with a diabetes educator?  Do I need to meet with a dietitian?  What number can I call if I have questions?  When are the best times to check my blood glucose? Where to find more information:  American Diabetes Association: diabetes.org/food-and-fitness/food  Academy of Nutrition and Dietetics:  www.eatright.org/resources/health/diseases-and-conditions/diabetes  National Institute of Diabetes and Digestive and Kidney Diseases (NIH): www.niddk.nih.gov/health-information/diabetes/overview/diet-eating-physical-activity Summary  A healthy meal plan will help you control your blood glucose and maintain a healthy lifestyle.  Working with a diet and nutrition specialist (dietitian) can help you make a meal plan that is best for you.  Keep in mind that carbohydrates and alcohol have immediate effects on your blood glucose levels. It is important to count carbohydrates and to use alcohol carefully. This information is not intended to replace advice given to you by your health care provider. Make sure you discuss any questions you have with your health care provider. Document Released: 11/17/2004 Document Revised: 03/27/2016 Document Reviewed: 03/27/2016 Elsevier Interactive Patient Education  2018 Elsevier Inc.  

## 2017-09-25 NOTE — Progress Notes (Addendum)
Subjective:    Patient ID: [name], female    DOB: Jul 19, 1955, 62 y.o.   MRN: [id number]   PT presents to the office todayfor chronic follow up.Pt is followed by Cardiologists annually for CHF and aortic valve sclerosis that is stable.   Pt currently on dialysis Monday, Wednesday, and Friday and followed by nephrologists there. 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  PT was hospitalized on 09/21/17 for fevers of 100.4 F. She was discharged on 09/22/17. Her cultures were negative. She reports feel better now. She is afebrile today.   Diabetes  She presents for her follow-up diabetic visit. She has type 2 diabetes mellitus. Her disease course has been stable. Hypoglycemia symptoms include nervousness/anxiousness. Pertinent negatives for diabetes include no blurred vision, no foot paresthesias and no visual change. Diabetic complications include heart disease and nephropathy. Pertinent negatives for diabetic complications include no peripheral neuropathy. Risk factors for coronary artery disease include diabetes mellitus, dyslipidemia, hypertension, sedentary lifestyle and post-menopausal. She is following a generally healthy diet. Her overall blood glucose range is 90-110 mg/dl. Eye exam is current.  Hypertension  This is a chronic problem. The current episode started more than 1 year ago. The problem has been resolved since onset. The problem is controlled. Associated symptoms include malaise/fatigue. Pertinent negatives include no blurred vision, peripheral edema or shortness of breath. Risk factors for coronary artery disease include diabetes mellitus, dyslipidemia, sedentary lifestyle and family history. The current treatment provides moderate improvement. Hypertensive end-organ damage includes kidney disease and CAD/MI.  Hyperlipidemia  This is a chronic problem. The current episode started more than 1 year ago. The problem is controlled. Recent lipid tests were reviewed and  are normal. Pertinent negatives include no shortness of breath. Current antihyperlipidemic treatment includes statins. The current treatment provides moderate improvement of lipids. Risk factors for coronary artery disease include diabetes mellitus, dyslipidemia, hypertension and a sedentary lifestyle.      Review of Systems  Constitutional: Positive for malaise/fatigue.  Eyes: Negative for blurred vision.  Respiratory: Negative for shortness of breath.   Psychiatric/Behavioral: The patient is nervous/anxious.   All other systems reviewed and are negative.      Objective:   Physical Exam  Constitutional: She is oriented to person, place, and time. She appears well-developed and well-nourished. No distress.  HENT:  Head: Normocephalic and atraumatic.  Right Ear: External ear normal.  Left Ear: External ear normal.  Mouth/Throat: Oropharynx is clear and moist.  Eyes: Pupils are equal, round, and reactive to light.  Neck: Normal range of motion. Neck supple. No thyromegaly present.  Cardiovascular: Normal rate, regular rhythm and intact distal pulses.  Murmur heard. Pulmonary/Chest: Effort normal and breath sounds normal. No respiratory distress. She has no wheezes.  Abdominal: Soft. Bowel sounds are normal. She exhibits no distension. There is no tenderness.  Musculoskeletal: Normal range of motion. She exhibits no edema or tenderness.  Neurological: She is alert and oriented to person, place, and time. She has normal reflexes. No cranial nerve deficit.  Skin: Skin is warm and dry.  Psychiatric: She has a normal mood and affect. Her behavior is normal. Judgment and thought content normal.  Vitals reviewed.     BP 131/61   Pulse (!) 57   Temp (!) 97.2 F (36.2 C) (Oral)   Ht '5\' 9"'$  (1.753 m)   Wt 142 lb 3.2 oz (64.5 kg)   BMI 21.00 kg/m      Assessment & Plan:  Tosca Pletz  Hane comes in today with chief complaint of Diabetes (four month recheck)   Diagnosis and orders  addressed:  1. Aortic valve sclerosis - CMP14+EGFR  2. Chronic diastolic heart failure (HCC) - CMP14+EGFR  3. 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 Type 2 diabetes mellitus with hyperglycemia, with long-term current use of insulin (HCC) We will decrease Tresiba to 8 units from 13 untis - insulin degludec (TRESIBA FLEXTOUCH) 100 UNIT/ML SOPN FlexTouch Pen; Inject 0.08 mLs (8 Units total) into the skin daily at 10 pm.  Dispense: 12 pen; Refill: 2 - Dulaglutide (TRULICITY) 1.5 NX/8.3FP SOPN; INJECT 1.5 MG INTO THE SKIN ONCE A WEEK  Dispense: 0.5 mL; Refill: 4 - Bayer DCA Hb A1c Waived - CMP14+EGFR  5. Hyperlipidemia associated with type 2 diabetes mellitus (HCC) - rosuvastatin (CRESTOR) 20 MG tablet; Take 1 tablet (20 mg total) by mouth daily.  Dispense: 90 tablet; Refill: 3 - CMP14+EGFR  6. Hypertension associated with diabetes ([place]) - amLODipine (NORVASC) 10 MG tablet; TAKE 10 MG BY MOUTH NIGHTLY  Dispense: 90 tablet; Refill: 4 - metoprolol tartrate (LOPRESSOR) 25 MG tablet; Take 0.5 tablets (12.5 mg total) by mouth 2 (two) times daily.  Dispense: 90 tablet; Refill: 3 - CMP14+EGFR  7. Hospital discharge follow-up   Labs pending Health Maintenance reviewed Diet and exercise encouraged  Follow up plan: 4 months    Evelina Dun, FNP

## 2017-09-26 DIAGNOSIS — Z992 Dependence on renal dialysis: Secondary | ICD-10-CM | POA: Diagnosis not present

## 2017-09-26 DIAGNOSIS — N186 End stage renal disease: Secondary | ICD-10-CM | POA: Diagnosis not present

## 2017-09-26 LAB — CULTURE, BLOOD (ROUTINE X 2)
CULTURE: NO GROWTH
Culture: NO GROWTH
SPECIAL REQUESTS: ADEQUATE
SPECIAL REQUESTS: ADEQUATE

## 2017-09-26 LAB — CMP14+EGFR
A/G RATIO: 1.4 (ref 1.2–2.2)
ALT: 15 IU/L (ref 0–32)
AST: 18 IU/L (ref 0–40)
Albumin: 4.2 g/dL (ref 3.6–4.8)
Alkaline Phosphatase: 151 IU/L — ABNORMAL HIGH (ref 39–117)
BILIRUBIN TOTAL: 0.4 mg/dL (ref 0.0–1.2)
BUN/Creatinine Ratio: 5 — ABNORMAL LOW (ref 12–28)
BUN: 31 mg/dL — ABNORMAL HIGH (ref 8–27)
CHLORIDE: 95 mmol/L — AB (ref 96–106)
CO2: 27 mmol/L (ref 20–29)
Calcium: 9.2 mg/dL (ref 8.7–10.3)
Creatinine, Ser: 6.71 mg/dL (ref 0.57–1.00)
GFR calc non Af Amer: 6 mL/min/{1.73_m2} — ABNORMAL LOW (ref >59)
GFR, EST AFRICAN AMERICAN: 7 mL/min/{1.73_m2} — AB (ref >59)
Globulin, Total: 3 g/dL (ref 1.5–4.5)
Glucose: 116 mg/dL — ABNORMAL HIGH (ref 65–99)
POTASSIUM: 5.1 mmol/L (ref 3.5–5.2)
Sodium: 141 mmol/L (ref 134–144)
Total Protein: 7.2 g/dL (ref 6.0–8.5)

## 2017-09-28 DIAGNOSIS — Z992 Dependence on renal dialysis: Secondary | ICD-10-CM | POA: Diagnosis not present

## 2017-09-28 DIAGNOSIS — N186 End stage renal disease: Secondary | ICD-10-CM | POA: Diagnosis not present

## 2017-10-01 DIAGNOSIS — N186 End stage renal disease: Secondary | ICD-10-CM | POA: Diagnosis not present

## 2017-10-01 DIAGNOSIS — Z992 Dependence on renal dialysis: Secondary | ICD-10-CM | POA: Diagnosis not present

## 2017-10-03 DIAGNOSIS — N186 End stage renal disease: Secondary | ICD-10-CM | POA: Diagnosis not present

## 2017-10-03 DIAGNOSIS — Z992 Dependence on renal dialysis: Secondary | ICD-10-CM | POA: Diagnosis not present

## 2017-10-05 DIAGNOSIS — Z992 Dependence on renal dialysis: Secondary | ICD-10-CM | POA: Diagnosis not present

## 2017-10-05 DIAGNOSIS — N186 End stage renal disease: Secondary | ICD-10-CM | POA: Diagnosis not present

## 2017-10-08 DIAGNOSIS — Z992 Dependence on renal dialysis: Secondary | ICD-10-CM | POA: Diagnosis not present

## 2017-10-08 DIAGNOSIS — N186 End stage renal disease: Secondary | ICD-10-CM | POA: Diagnosis not present

## 2017-10-10 DIAGNOSIS — N186 End stage renal disease: Secondary | ICD-10-CM | POA: Diagnosis not present

## 2017-10-10 DIAGNOSIS — Z992 Dependence on renal dialysis: Secondary | ICD-10-CM | POA: Diagnosis not present

## 2017-10-12 DIAGNOSIS — Z992 Dependence on renal dialysis: Secondary | ICD-10-CM | POA: Diagnosis not present

## 2017-10-12 DIAGNOSIS — N186 End stage renal disease: Secondary | ICD-10-CM | POA: Diagnosis not present

## 2017-10-15 DIAGNOSIS — Z992 Dependence on renal dialysis: Secondary | ICD-10-CM | POA: Diagnosis not present

## 2017-10-15 DIAGNOSIS — N186 End stage renal disease: Secondary | ICD-10-CM | POA: Diagnosis not present

## 2017-10-17 DIAGNOSIS — N186 End stage renal disease: Secondary | ICD-10-CM | POA: Diagnosis not present

## 2017-10-17 DIAGNOSIS — Z992 Dependence on renal dialysis: Secondary | ICD-10-CM | POA: Diagnosis not present

## 2017-10-19 DIAGNOSIS — Z992 Dependence on renal dialysis: Secondary | ICD-10-CM | POA: Diagnosis not present

## 2017-10-19 DIAGNOSIS — N186 End stage renal disease: Secondary | ICD-10-CM | POA: Diagnosis not present

## 2017-10-22 DIAGNOSIS — Z992 Dependence on renal dialysis: Secondary | ICD-10-CM | POA: Diagnosis not present

## 2017-10-22 DIAGNOSIS — N186 End stage renal disease: Secondary | ICD-10-CM | POA: Diagnosis not present

## 2017-10-24 ENCOUNTER — Ambulatory Visit: Payer: BLUE CROSS/BLUE SHIELD | Admitting: Pediatrics

## 2017-10-24 ENCOUNTER — Encounter: Payer: Self-pay | Admitting: Pediatrics

## 2017-10-24 VITALS — BP 90/50 | HR 60 | Temp 98.2°F | Ht 69.0 in | Wt 140.6 lb

## 2017-10-24 DIAGNOSIS — R51 Headache: Secondary | ICD-10-CM

## 2017-10-24 DIAGNOSIS — Z992 Dependence on renal dialysis: Secondary | ICD-10-CM | POA: Diagnosis not present

## 2017-10-24 DIAGNOSIS — E1159 Type 2 diabetes mellitus with other circulatory complications: Secondary | ICD-10-CM

## 2017-10-24 DIAGNOSIS — I152 Hypertension secondary to endocrine disorders: Secondary | ICD-10-CM

## 2017-10-24 DIAGNOSIS — R519 Headache, unspecified: Secondary | ICD-10-CM

## 2017-10-24 DIAGNOSIS — I10 Essential (primary) hypertension: Secondary | ICD-10-CM | POA: Diagnosis not present

## 2017-10-24 DIAGNOSIS — N186 End stage renal disease: Secondary | ICD-10-CM | POA: Diagnosis not present

## 2017-10-24 MED ORDER — AMLODIPINE BESYLATE 5 MG PO TABS: ORAL_TABLET | ORAL | 1 refills | Status: DC

## 2017-10-24 MED ORDER — AMLODIPINE BESYLATE 10 MG PO TABS: ORAL_TABLET | ORAL | 4 refills | Status: DC

## 2017-10-24 NOTE — Progress Notes (Signed)
  Subjective:   Patient ID: [name], female    DOB: February 21, 1956, 62 y.o.   MRN: [id number] CC: Headache (starts during the last 30 minutes dialysis)  HPI: [name] is a 62 y.o. female   Gets stabbing HA over R eye in the last 30 min of dialysis for the past week. Doesn't have headaches at other times. Takes tylenol once she gets home, pain eases off, head feels sore a couple hours. Sometimes BP are slightly low at dialysis, she has to stand up for them to check it before she can go home. She thinks her vision gets blurry when she gets the headache.   She gets headaches at other times, not as intense as the headaches at end of dialysis.   Relevant past medical, surgical, family and social history reviewed. Allergies and medications reviewed and updated. Social History   Tobacco Use  Smoking Status Never Smoker  Smokeless Tobacco Never Used   ROS: Per HPI   Objective:    BP (!) 90/50   Pulse 60   Temp 98.2 F (36.8 C) (Oral)   Ht 5\' 9"  (1.753 m)   Wt 140 lb 9.6 oz (63.8 kg)   BMI 20.76 kg/m   Wt Readings from Last 3 Encounters:  10/24/17 140 lb 9.6 oz (63.8 kg)  09/25/17 142 lb 3.2 oz (64.5 kg)  09/22/17 143 lb 8.3 oz (65.1 kg)    Gen: NAD, alert, cooperative with exam, NCAT EYES: EOMI, no conjunctival injection, or no icterus ENT:  TMs pearly gray b/l, OP without erythema LYMPH: no cervical LAD CV: NRRR, normal S1/S2, no murmur, distal pulses 2+ b/l Resp: CTABL, no wheezes, normal WOB Abd: +BS, soft, NTND. no guarding or organomegaly Ext: No edema, warm Neuro: Alert and oriented, strength equal b/l UE and LE, coordination grossly normal, CN III-XII intact MSK: normal muscle bulk  Assessment & Plan:  [name] was seen today for headache.  Diagnoses and all orders for this visit:  Nonintractable headache, unspecified chronicity pattern, unspecified headache type Unclear etiology.  Will decrease blood pressure medicine, blood pressures are soft today.  Patient  to follow-up with nephrology as well with low blood pressures have been at the end of dialysis.  Will check below labs.  May need imaging if not improving.  Headache does seem mostly situational, always comes on at the end of dialysis.  She does not have this headache at other times. -     Sedimentation rate -     C-reactive protein  Hypertension associated with diabetes ([place]) Low blood pressures.  Decrease 10 mg amlodipine to 5 mg.  Follow blood pressures at home.  Let us know if elevated or low. -     amLODipine (NORVASC) 5 MG tablet; TAKE 5 MG BY MOUTH NIGHTLY   Follow up plan: Return in about 2 weeks (around 11/07/2017).  Sooner if needed.  Return precautions discussed. Assunta Found, MD [place]

## 2017-10-25 LAB — C-REACTIVE PROTEIN: CRP: 1 mg/L (ref 0–10)

## 2017-10-25 LAB — SEDIMENTATION RATE: Sed Rate: 11 mm/hr (ref 0–40)

## 2017-10-26 ENCOUNTER — Other Ambulatory Visit: Payer: Self-pay | Admitting: Family

## 2017-10-26 DIAGNOSIS — Z992 Dependence on renal dialysis: Secondary | ICD-10-CM | POA: Diagnosis not present

## 2017-10-26 DIAGNOSIS — N186 End stage renal disease: Secondary | ICD-10-CM | POA: Diagnosis not present

## 2017-10-27 ENCOUNTER — Encounter: Payer: Self-pay | Admitting: Pediatrics

## 2017-10-29 DIAGNOSIS — N186 End stage renal disease: Secondary | ICD-10-CM | POA: Diagnosis not present

## 2017-10-29 DIAGNOSIS — Z992 Dependence on renal dialysis: Secondary | ICD-10-CM | POA: Diagnosis not present

## 2017-10-29 NOTE — Telephone Encounter (Signed)
Last seen 10/24/17 

## 2017-10-31 DIAGNOSIS — Z992 Dependence on renal dialysis: Secondary | ICD-10-CM | POA: Diagnosis not present

## 2017-10-31 DIAGNOSIS — N186 End stage renal disease: Secondary | ICD-10-CM | POA: Diagnosis not present

## 2017-11-02 DIAGNOSIS — Z992 Dependence on renal dialysis: Secondary | ICD-10-CM | POA: Diagnosis not present

## 2017-11-02 DIAGNOSIS — N186 End stage renal disease: Secondary | ICD-10-CM | POA: Diagnosis not present

## 2017-11-05 DIAGNOSIS — Z992 Dependence on renal dialysis: Secondary | ICD-10-CM | POA: Diagnosis not present

## 2017-11-05 DIAGNOSIS — N186 End stage renal disease: Secondary | ICD-10-CM | POA: Diagnosis not present

## 2017-11-07 DIAGNOSIS — N186 End stage renal disease: Secondary | ICD-10-CM | POA: Diagnosis not present

## 2017-11-07 DIAGNOSIS — Z992 Dependence on renal dialysis: Secondary | ICD-10-CM | POA: Diagnosis not present

## 2017-11-09 DIAGNOSIS — Z992 Dependence on renal dialysis: Secondary | ICD-10-CM | POA: Diagnosis not present

## 2017-11-09 DIAGNOSIS — N186 End stage renal disease: Secondary | ICD-10-CM | POA: Diagnosis not present

## 2017-11-12 ENCOUNTER — Ambulatory Visit: Payer: BLUE CROSS/BLUE SHIELD | Admitting: Family

## 2017-11-12 DIAGNOSIS — N186 End stage renal disease: Secondary | ICD-10-CM | POA: Diagnosis not present

## 2017-11-12 DIAGNOSIS — Z992 Dependence on renal dialysis: Secondary | ICD-10-CM | POA: Diagnosis not present

## 2017-11-13 ENCOUNTER — Encounter: Payer: Self-pay | Admitting: Family

## 2017-11-13 ENCOUNTER — Telehealth: Payer: Self-pay | Admitting: Family

## 2017-11-13 ENCOUNTER — Ambulatory Visit: Payer: BLUE CROSS/BLUE SHIELD | Admitting: Family

## 2017-11-13 VITALS — BP 125/65 | HR 58 | Temp 97.4°F | Ht 69.0 in | Wt 144.6 lb

## 2017-11-13 DIAGNOSIS — R42 Dizziness and giddiness: Secondary | ICD-10-CM | POA: Diagnosis not present

## 2017-11-13 DIAGNOSIS — N186 End stage renal disease: Secondary | ICD-10-CM | POA: Diagnosis not present

## 2017-11-13 DIAGNOSIS — Z992 Dependence on renal dialysis: Secondary | ICD-10-CM | POA: Insufficient documentation

## 2017-11-13 MED ORDER — MECLIZINE HCL 25 MG PO TABS: 25.0000 mg | ORAL_TABLET | Freq: Two times a day (BID) | ORAL | 1 refills | Status: DC | PRN

## 2017-11-13 NOTE — Telephone Encounter (Signed)
Antivert Prescription sent to pharmacy   

## 2017-11-13 NOTE — Progress Notes (Signed)
   Subjective:    Patient ID: [name], female    DOB: 04-30-1955, 62 y.o.   MRN: [id number]  Chief Complaint  Patient presents with  . Dizziness   Pt currently on dialysis Monday, Wednesday, and Friday and followed by nephrologists there. 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  Dizziness  This is a new problem. The current episode started more than 1 month ago. The problem occurs intermittently. The problem has been waxing and waning. Associated symptoms include nausea and vomiting. The symptoms are aggravated by bending. She has tried rest and acetaminophen for the symptoms. The treatment provided mild relief.      Review of Systems  Gastrointestinal: Positive for nausea and vomiting.  Neurological: Positive for dizziness.  All other systems reviewed and are negative.      Objective:   Physical Exam  Constitutional: She is oriented to person, place, and time. She appears well-developed and well-nourished. No distress.  HENT:  Head: Normocephalic and atraumatic.  Right Ear: External ear normal.  Left Ear: External ear normal.  Mouth/Throat: Posterior oropharyngeal erythema present.  Eyes: Pupils are equal, round, and reactive to light.  Neck: Normal range of motion. Neck supple. No thyromegaly present.  Cardiovascular: Normal rate, regular rhythm and intact distal pulses.  Murmur heard. Pulmonary/Chest: Effort normal and breath sounds normal. No respiratory distress. She has no wheezes.  Abdominal: Soft. Bowel sounds are normal. She exhibits no distension. There is no tenderness.  Musculoskeletal: Normal range of motion. She exhibits no edema or tenderness.  Neurological: She is alert and oriented to person, place, and time. She has normal reflexes.  Skin: Skin is warm and dry.  Psychiatric: She has a normal mood and affect. Her behavior is normal. Judgment and thought content normal.  Vitals  reviewed.     BP 125/65   Pulse (!) 58   Temp (!) 97.4 F (36.3 C) (Oral)   Ht 5\' 9"  (1.753 m)   Wt 144 lb 9.6 oz (65.6 kg)   BMI 21.35 kg/m      Assessment & Plan:  [name] comes in today with chief complaint of Dizziness   Diagnosis and orders addressed:  1. Dizziness  2. Chronic kidney disease on chronic dialysis ([place])  3. CKD (chronic kidney disease) requiring chronic dialysis [facility])    Fall preventions  Epely exercises discussed and handout given BP is normal today and this dizziness could be related to dialysis since this is worse on these days? She will ask Nephrologists if it is safe for her to take antivert as needed RTO if symptoms worsen or do not improve    Evelina Dun, FNP

## 2017-11-13 NOTE — Telephone Encounter (Signed)
Aware. 

## 2017-11-13 NOTE — Patient Instructions (Signed)
Vertigo °Vertigo is the feeling that you or your surroundings are moving when they are not. Vertigo can be dangerous if it occurs while you are doing something that could endanger you or others, such as driving. °What are the causes? °This condition is caused by a disturbance in the signals that are sent by your body’s sensory systems to your brain. Different causes of a disturbance can lead to vertigo, including: °· Infections, especially in the inner ear. °· A bad reaction to a drug, or misuse of alcohol and medicines. °· Withdrawal from drugs or alcohol. °· Quickly changing positions, as when lying down or rolling over in bed. °· Migraine headaches. °· Decreased blood flow to the brain. °· Decreased blood pressure. °· Increased pressure in the brain from a head or neck injury, stroke, infection, tumor, or bleeding. °· Central nervous system disorders. ° °What are the signs or symptoms? °Symptoms of this condition usually occur when you move your head or your eyes in different directions. Symptoms may start suddenly, and they usually last for less than a minute. Symptoms may include: °· Loss of balance and falling. °· Feeling like you are spinning or moving. °· Feeling like your surroundings are spinning or moving. °· Nausea and vomiting. °· Blurred vision or double vision. °· Difficulty hearing. °· Slurred speech. °· Dizziness. °· Involuntary eye movement (nystagmus). ° °Symptoms can be mild and cause only slight annoyance, or they can be severe and interfere with daily life. Episodes of vertigo may return (recur) over time, and they are often triggered by certain movements. Symptoms may improve over time. °How is this diagnosed? °This condition may be diagnosed based on medical history and the quality of your nystagmus. Your health care provider may test your eye movements by asking you to quickly change positions to trigger the nystagmus. This may be called the Dix-Hallpike test, head thrust test, or roll test.  You may be referred to a health care provider who specializes in ear, nose, and throat (ENT) problems (otolaryngologist) or a provider who specializes in disorders of the central nervous system (neurologist). °You may have additional testing, including: °· A physical exam. °· Blood tests. °· MRI. °· A CT scan. °· An electrocardiogram (ECG). This records electrical activity in your heart. °· An electroencephalogram (EEG). This records electrical activity in your brain. °· Hearing tests. ° °How is this treated? °Treatment for this condition depends on the cause and the severity of the symptoms. Treatment options include: °· Medicines to treat nausea or vertigo. These are usually used for severe cases. Some medicines that are used to treat other conditions may also reduce or eliminate vertigo symptoms. These include: °? Medicines that control allergies (antihistamines). °? Medicines that control seizures (anticonvulsants). °? Medicines that relieve depression (antidepressants). °? Medicines that relieve anxiety (sedatives). °· Head movements to adjust your inner ear back to normal. If your vertigo is caused by an ear problem, your health care provider may recommend certain movements to correct the problem. °· Surgery. This is rare. ° °Follow these instructions at home: °Safety °· Move slowly.Avoid sudden body or head movements. °· Avoid driving. °· Avoid operating heavy machinery. °· Avoid doing any tasks that would cause danger to you or others if you would have a vertigo episode during the task. °· If you have trouble walking or keeping your balance, try using a cane for stability. If you feel dizzy or unstable, sit down right away. °· Return to your normal activities as told by your   health care provider. Ask your health care provider what activities are safe for you. °General instructions °· Take over-the-counter and prescription medicines only as told by your health care provider. °· Avoid certain positions or  movements as told by your health care provider. °· Drink enough fluid to keep your urine clear or pale yellow. °· Keep all follow-up visits as told by your health care provider. This is important. °Contact a health care provider if: °· Your medicines do not relieve your vertigo or they make it worse. °· You have a fever. °· Your condition gets worse or you develop new symptoms. °· Your family or friends notice any behavioral changes. °· Your nausea or vomiting gets worse. °· You have numbness or a “pins and needles” sensation in part of your body. °Get help right away if: °· You have difficulty moving or speaking. °· You are always dizzy. °· You faint. °· You develop severe headaches. °· You have weakness in your hands, arms, or legs. °· You have changes in your hearing or vision. °· You develop a stiff neck. °· You develop sensitivity to light. °This information is not intended to replace advice given to you by your health care provider. Make sure you discuss any questions you have with your health care provider. °Document Released: 11/30/2004 Document Revised: 08/04/2015 Document Reviewed: 06/15/2014 °Elsevier Interactive Patient Education © 2018 [employer] ° °

## 2017-11-14 ENCOUNTER — Telehealth: Payer: Self-pay | Admitting: Family

## 2017-11-14 DIAGNOSIS — Z992 Dependence on renal dialysis: Secondary | ICD-10-CM | POA: Diagnosis not present

## 2017-11-14 DIAGNOSIS — N186 End stage renal disease: Secondary | ICD-10-CM | POA: Diagnosis not present

## 2017-11-14 NOTE — Telephone Encounter (Signed)
She could buy over-the-counter Dramamine and try that and see if it helps instead, please send a prescription of ondansetron 4 mg ODT every 8 hours as needed and give her 20 with no refills

## 2017-11-14 NOTE — Telephone Encounter (Signed)
Attempted to contact patient - NA °

## 2017-11-14 NOTE — Telephone Encounter (Signed)
Returned daughter's phone call.  Daughter states that dizziness is constant with little to no relief from meclizine. Wants to know if there is anything else that can be done. Daughter states that dizziness is affecting appetite

## 2017-11-16 DIAGNOSIS — Z992 Dependence on renal dialysis: Secondary | ICD-10-CM | POA: Diagnosis not present

## 2017-11-16 DIAGNOSIS — N186 End stage renal disease: Secondary | ICD-10-CM | POA: Diagnosis not present

## 2017-11-19 DIAGNOSIS — N186 End stage renal disease: Secondary | ICD-10-CM | POA: Diagnosis not present

## 2017-11-19 DIAGNOSIS — Z992 Dependence on renal dialysis: Secondary | ICD-10-CM | POA: Diagnosis not present

## 2017-11-20 ENCOUNTER — Telehealth: Payer: Self-pay | Admitting: Family

## 2017-11-20 DIAGNOSIS — I5032 Chronic diastolic (congestive) heart failure: Secondary | ICD-10-CM | POA: Diagnosis not present

## 2017-11-20 DIAGNOSIS — A084 Viral intestinal infection, unspecified: Secondary | ICD-10-CM

## 2017-11-20 DIAGNOSIS — T8612 Kidney transplant failure: Secondary | ICD-10-CM | POA: Diagnosis not present

## 2017-11-20 DIAGNOSIS — I272 Pulmonary hypertension, unspecified: Secondary | ICD-10-CM | POA: Diagnosis not present

## 2017-11-20 MED ORDER — ONDANSETRON 4 MG PO TBDP
4.0000 mg | ORAL_TABLET | Freq: Three times a day (TID) | ORAL | 0 refills | Status: DC | PRN
Start: 2017-11-20 — End: 2017-11-26

## 2017-11-20 NOTE — Telephone Encounter (Signed)
Left message for patient to call . We can send in medication for nausea if needed. Call for any problems or concerns.

## 2017-11-20 NOTE — Telephone Encounter (Signed)
Dizziness has improved since visit on 11/13/17. Daughter feels that nausea is due to changes in dialysis. Requesting refill of ondansetron ODT. Discussed with Dr Lajuana Ripple and refill sent to [facility] in [place]. Daughter notified.

## 2017-11-21 DIAGNOSIS — N186 End stage renal disease: Secondary | ICD-10-CM | POA: Diagnosis not present

## 2017-11-21 DIAGNOSIS — Z992 Dependence on renal dialysis: Secondary | ICD-10-CM | POA: Diagnosis not present

## 2017-11-23 DIAGNOSIS — Z992 Dependence on renal dialysis: Secondary | ICD-10-CM | POA: Diagnosis not present

## 2017-11-23 DIAGNOSIS — N186 End stage renal disease: Secondary | ICD-10-CM | POA: Diagnosis not present

## 2017-11-26 ENCOUNTER — Emergency Department (HOSPITAL_COMMUNITY): Payer: BLUE CROSS/BLUE SHIELD

## 2017-11-26 ENCOUNTER — Encounter (HOSPITAL_COMMUNITY): Payer: Self-pay | Admitting: Emergency Medicine

## 2017-11-26 ENCOUNTER — Emergency Department (HOSPITAL_COMMUNITY)
Admission: EM | Admit: 2017-11-26 | Discharge: 2017-11-26 | Disposition: A | Discharge status: Home / Self Care | Payer: BLUE CROSS/BLUE SHIELD | Attending: Emergency Medicine | Admitting: Emergency Medicine

## 2017-11-26 DIAGNOSIS — I5032 Chronic diastolic (congestive) heart failure: Secondary | ICD-10-CM | POA: Insufficient documentation

## 2017-11-26 DIAGNOSIS — Z992 Dependence on renal dialysis: Secondary | ICD-10-CM | POA: Insufficient documentation

## 2017-11-26 DIAGNOSIS — I2510 Atherosclerotic heart disease of native coronary artery without angina pectoris: Secondary | ICD-10-CM | POA: Diagnosis not present

## 2017-11-26 DIAGNOSIS — N186 End stage renal disease: Secondary | ICD-10-CM | POA: Diagnosis not present

## 2017-11-26 DIAGNOSIS — Z7982 Long term (current) use of aspirin: Secondary | ICD-10-CM | POA: Insufficient documentation

## 2017-11-26 DIAGNOSIS — R51 Headache: Secondary | ICD-10-CM | POA: Diagnosis not present

## 2017-11-26 DIAGNOSIS — I130 Hypertensive heart and chronic kidney disease with heart failure and stage 1 through stage 4 chronic kidney disease, or unspecified chronic kidney disease: Secondary | ICD-10-CM | POA: Insufficient documentation

## 2017-11-26 DIAGNOSIS — Z794 Long term (current) use of insulin: Secondary | ICD-10-CM | POA: Diagnosis not present

## 2017-11-26 DIAGNOSIS — R519 Headache, unspecified: Secondary | ICD-10-CM

## 2017-11-26 DIAGNOSIS — Z79899 Other long term (current) drug therapy: Secondary | ICD-10-CM | POA: Insufficient documentation

## 2017-11-26 DIAGNOSIS — E119 Type 2 diabetes mellitus without complications: Secondary | ICD-10-CM | POA: Insufficient documentation

## 2017-11-26 DIAGNOSIS — N189 Chronic kidney disease, unspecified: Secondary | ICD-10-CM | POA: Diagnosis not present

## 2017-11-26 DIAGNOSIS — R110 Nausea: Secondary | ICD-10-CM | POA: Diagnosis not present

## 2017-11-26 LAB — CBC WITH DIFFERENTIAL/PLATELET
Basophils Absolute: 0 10*3/uL (ref 0.0–0.1)
Basophils Relative: 0 %
Eosinophils Absolute: 0.1 10*3/uL (ref 0.0–0.7)
Eosinophils Relative: 1 %
HEMATOCRIT: 42.1 % (ref 36.0–46.0)
HEMOGLOBIN: 13.4 g/dL (ref 12.0–15.0)
LYMPHS PCT: 6 %
Lymphs Abs: 0.7 10*3/uL (ref 0.7–4.0)
MCH: 31.9 pg (ref 26.0–34.0)
MCHC: 31.8 g/dL (ref 30.0–36.0)
MCV: 100.2 fL — AB (ref 78.0–100.0)
MONO ABS: 0.5 10*3/uL (ref 0.1–1.0)
Monocytes Relative: 4 %
NEUTROS ABS: 10.6 10*3/uL — AB (ref 1.7–7.7)
NEUTROS PCT: 89 %
Platelets: 168 10*3/uL (ref 150–400)
RBC: 4.2 MIL/uL (ref 3.87–5.11)
RDW: 14.9 % (ref 11.5–15.5)
WBC: 11.9 10*3/uL — ABNORMAL HIGH (ref 4.0–10.5)

## 2017-11-26 LAB — COMPREHENSIVE METABOLIC PANEL
ALK PHOS: 143 U/L — AB (ref 38–126)
ALT: 31 U/L (ref 0–44)
AST: 32 U/L (ref 15–41)
Albumin: 3.8 g/dL (ref 3.5–5.0)
Anion gap: 11 (ref 5–15)
BILIRUBIN TOTAL: 1 mg/dL (ref 0.3–1.2)
BUN: 22 mg/dL (ref 8–23)
CALCIUM: 8.7 mg/dL — AB (ref 8.9–10.3)
CO2: 29 mmol/L (ref 22–32)
CREATININE: 4.35 mg/dL — AB (ref 0.44–1.00)
Chloride: 98 mmol/L (ref 98–111)
GFR calc Af Amer: 11 mL/min — ABNORMAL LOW (ref >60)
GFR, EST NON AFRICAN AMERICAN: 10 mL/min — AB (ref >60)
GLUCOSE: 182 mg/dL — AB (ref 70–99)
Potassium: 5.4 mmol/L — ABNORMAL HIGH (ref 3.5–5.1)
Sodium: 138 mmol/L (ref 135–145)
TOTAL PROTEIN: 7.9 g/dL (ref 6.5–8.1)

## 2017-11-26 MED ORDER — HYDROCODONE-ACETAMINOPHEN 5-325 MG PO TABS: 1.0000 | ORAL_TABLET | Freq: Four times a day (QID) | ORAL | 0 refills | Status: DC | PRN

## 2017-11-26 MED ORDER — ONDANSETRON 4 MG PO TBDP
4.0000 mg | ORAL_TABLET | Freq: Once | ORAL | Status: AC
  Administered 2017-11-26: 4 mg via ORAL
  Filled 2017-11-26: qty 1

## 2017-11-26 MED ORDER — ONDANSETRON 4 MG PO TBDP: ORAL_TABLET | ORAL | 0 refills | Status: DC

## 2017-11-26 MED ORDER — HYDROMORPHONE HCL 1 MG/ML IJ SOLN
1.0000 mg | Freq: Once | INTRAMUSCULAR | Status: AC
  Administered 2017-11-26: 1 mg via INTRAMUSCULAR
  Filled 2017-11-26: qty 1

## 2017-11-26 NOTE — Discharge Instructions (Signed)
Follow-up with your family doctor if not improving 

## 2017-11-26 NOTE — ED Triage Notes (Signed)
Pt states she began feeling bad while getting dialysis this morning.  States she has a headache, nausea, and vomiting.  Was taken off of dialysis early due to feeling poorly and this is not the first time this has happened with the headache.

## 2017-11-27 NOTE — ED Provider Notes (Signed)
[facility] EMERGENCY DEPARTMENT Provider Note   CSN: [id number] Arrival date & time: 11/26/17  1509     History   Chief Complaint Chief Complaint  Patient presents with  . Headache    HPI [name] is a 62 y.o. female.  Patient states she got nauseated and has a headache while she was on dialysis today.  She has vomited also  The history is provided by the patient. No language interpreter was used.  Headache   This is a new problem. The current episode started 6 to 12 hours ago. The problem occurs constantly. The problem has not changed since onset.Associated with: Nausea. The pain is located in the bilateral region. The quality of the pain is described as dull. The pain is at a severity of 6/10. The pain is moderate. The pain does not radiate. Associated symptoms include anorexia and nausea. She has tried nothing for the symptoms. The treatment provided no relief.    Past Medical History:  Diagnosis Date  . Anemia    of chronic disease  . Blood transfusion without reported diagnosis   . Cardiovascular disease    nonobstructive  . Carotid artery stenosis 2008  . CHF (congestive heart failure) ([place])   . Coronary artery disease   . Diabetes mellitus   . Dyslipidemia   . Edema of lower extremity    with hypo-albuminemia and profound protenuria  . Heart murmur   . Hypertension   . Mitral regurgitation   . Nephrotic syndrome   . Patent foramen ovale   . Pulmonary hypertension, moderate to severe ([place])   . Pulmonary nodule   . Tricuspid regurgitation   . Volume depletion, renal, due to output loss (renal deficit)     Patient Active Problem List   Diagnosis Date Noted  . Chronic kidney disease on chronic dialysis ([place]) 11/13/2017  . Nausea 09/21/2017  . Leukocytosis 09/21/2017  . Mild fever 09/21/2017  . Chills 09/21/2017  . Generalized weakness 09/21/2017  . Nausea & vomiting 05/30/2017  . UTI (urinary tract infection) 05/30/2017  . Hyperlipidemia  associated with type 2 diabetes mellitus ([place]) 02/22/2017  . CKD (chronic kidney disease) requiring chronic dialysis ([place]) 08-22-16  . 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 Encounter for aftercare following kidney transplant 05/22/2016  . Chronic diastolic heart failure ([place]) 06/08/2015  . Aortic valve sclerosis 06/08/2015  . Anemia of chronic disease 07/03/2013  . Hypertension associated with diabetes ([place]) 12/18/2010  . Diabetes mellitus ([place]) 12/18/2010    Past Surgical History:  Procedure Laterality Date  . A/V FISTULAGRAM N/A 04/05/2017   Procedure: A/V FISTULAGRAM - Left Arm;  Surgeon: Angelia Mould, MD;  Location: [place] [facility];  Service: Cardiovascular;  Laterality: N/A;  . A/V FISTULAGRAM Left 09/18/2017   Procedure: A/V FISTULAGRAM;  Surgeon: Serafina Mitchell, MD;  Location: [place] [facility];  Service: Cardiovascular;  Laterality: Left;  . A/V SHUNT INTERVENTION Left 04/05/2017   Procedure: A/V SHUNT INTERVENTION;  Surgeon: Angelia Mould, MD;  Location: [place] [facility];  Service: Cardiovascular;  Laterality: Left;  . CARDIAC CATHETERIZATION  2008  . IR REMOVAL TUN CV CATH W/O FL  11/02/2016  . 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 PERIPHERAL VASCULAR BALLOON ANGIOPLASTY Left 09/18/2017   Procedure: PERIPHERAL VASCULAR BALLOON ANGIOPLASTY;  Surgeon: Serafina Mitchell, MD;  Location: [place] [facility];  Service: Cardiovascular;  Laterality: Left;  Arm Fistula     OB History   None  Home Medications    Prior to Admission medications   Medication Sig Start Date End Date Taking? Authorizing Provider  acetaminophen (TYLENOL) 500 MG tablet Take 500 mg by mouth every 6 (six) hours as needed for moderate pain or headache.  04/06/16   [provider]  amLODipine (NORVASC) 5 MG tablet TAKE 5 MG BY MOUTH NIGHTLY 10/24/17   Eustaquio Maize, MD  aspirin (ASPIR-LOW) 81 MG EC tablet Take 81 mg by mouth daily.     [provider]  Dulaglutide (TRULICITY) 1.5 XB/3.5HG SOPN INJECT 1.5 MG INTO THE SKIN ONCE A WEEK 09/25/17   Hawks, Alyse Low A, FNP  HYDROcodone-acetaminophen (NORCO/VICODIN) 5-325 MG tablet Take 1 tablet by mouth every 6 (six) hours as needed for moderate pain. 11/26/17   Milton Ferguson, MD  insulin degludec (TRESIBA FLEXTOUCH) 100 UNIT/ML SOPN FlexTouch Pen Inject 0.08 mLs (8 Units total) into the skin daily at 10 pm. 09/25/17   Sharion Balloon, FNP  isosorbide mononitrate (IMDUR) 60 MG 24 hr tablet TAKE 1 TABLET BY MOUTH ONCE DAILY 10/29/17   Evelina Dun A, FNP  lidocaine-prilocaine (EMLA) cream Apply 1 application topically as needed (for dialysis).  09/08/16   [provider]  meclizine (ANTIVERT) 25 MG tablet Take 1 tablet (25 mg total) by mouth 2 (two) times daily as needed for dizziness. 11/13/17   Evelina Dun A, FNP  metoprolol tartrate (LOPRESSOR) 25 MG tablet Take 0.5 tablets (12.5 mg total) by mouth 2 (two) times daily. 09/25/17   Sharion Balloon, FNP  ondansetron (ZOFRAN ODT) 4 MG disintegrating tablet 4mg  ODT q4 hours prn nausea/vomit 11/26/17   Milton Ferguson, MD  predniSONE (DELTASONE) 5 MG tablet TAKE 1 TABLET BY MOUTH ONCE DAILY WITH BREAKFAST 10/29/17   Hawks, Christy A, FNP  PROGRAF 0.5 MG capsule Take 0.5-1 mg by mouth See admin instructions. Take 1 mg by mouth in the morning and take 0.5 mg by mouth at bedtime 10/24/16   [provider]  rosuvastatin (CRESTOR) 20 MG tablet Take 1 tablet (20 mg total) by mouth daily. 09/25/17   Sharion Balloon, FNP  sevelamer carbonate (RENVELA) 800 MG tablet Take 3200 mg by mouth 3 times daily with meals and take 1600 mg by mouth with snacks 10/02/16   [provider]    Family History Family History  Problem Relation Age of Onset  . Kidney disease Mother   . Diabetes Mother   . Diabetes Father   . Heart disease Father     Social History Social History   Tobacco Use  . Smoking status: Never Smoker  . Smokeless  tobacco: Never Used  Substance Use Topics  . Alcohol use: No  . Drug use: No     Allergies   Other   Review of Systems Review of Systems  Constitutional: Negative for appetite change and fatigue.  HENT: Negative for congestion, ear discharge and sinus pressure.   Eyes: Negative for discharge.  Respiratory: Negative for cough.   Cardiovascular: Negative for chest pain.  Gastrointestinal: Positive for anorexia and nausea. Negative for abdominal pain and diarrhea.  Genitourinary: Negative for frequency and hematuria.  Musculoskeletal: Negative for back pain.  Skin: Negative for rash.  Neurological: Positive for headaches. Negative for seizures.  Psychiatric/Behavioral: Negative for hallucinations.     Physical Exam Updated Vital Signs BP (!) 190/74   Pulse 66   Temp 97.8 F (36.6 C)   Resp 16   Ht 5\' 9"  (1.753 m)   Wt  69.1 kg   SpO2 100%   BMI 22.50 kg/m   Physical Exam  Constitutional: She is oriented to person, place, and time. She appears well-developed.  HENT:  Head: Normocephalic.  Eyes: Conjunctivae and EOM are normal. No scleral icterus.  Neck: Neck supple. No thyromegaly present.  Cardiovascular: Normal rate and regular rhythm. Exam reveals no gallop and no friction rub.  No murmur heard. Pulmonary/Chest: No stridor. She has no wheezes. She has no rales. She exhibits no tenderness.  Abdominal: She exhibits no distension. There is no tenderness. There is no rebound.  Musculoskeletal: Normal range of motion. She exhibits no edema.  Lymphadenopathy:    She has no cervical adenopathy.  Neurological: She is oriented to person, place, and time. She exhibits normal muscle tone. Coordination normal.  Skin: No rash noted. No erythema.  Psychiatric: She has a normal mood and affect. Her behavior is normal.     ED Treatments / Results  Labs (all labs ordered are listed, but only abnormal results are displayed) Labs Reviewed  CBC WITH DIFFERENTIAL/PLATELET -  Abnormal; Notable for the following components:      Result Value   WBC 11.9 (*)    MCV 100.2 (*)    Neutro Abs 10.6 (*)    All other components within normal limits  COMPREHENSIVE METABOLIC PANEL - Abnormal; Notable for the following components:   Potassium 5.4 (*)    Glucose, Bld 182 (*)    Creatinine, Ser 4.35 (*)    Calcium 8.7 (*)    Alkaline Phosphatase 143 (*)    GFR calc non Af Amer 10 (*)    GFR calc Af Amer 11 (*)    All other components within normal limits    EKG None  Radiology Ct Head Wo Contrast  Result Date: 11/26/2017 CLINICAL DATA:  Feeling unwell at dialysis today: Headache, nausea and vomiting. History of hypertension, diabetes. EXAM: CT HEAD WITHOUT CONTRAST TECHNIQUE: Contiguous axial images were obtained from the base of the skull through the vertex without intravenous contrast. COMPARISON:  CT HEAD December 04, 2015 FINDINGS: BRAIN: No intraparenchymal hemorrhage, mass effect nor midline shift. The ventricles and sulci are normal for age. No acute large vascular territory infarcts. No abnormal extra-axial fluid collections. Basal cisterns are patent. VASCULAR: Moderate to severe calcific atherosclerosis of the carotid siphons. SKULL: No skull fracture. Osteopenia. No significant scalp soft tissue swelling. SINUSES/ORBITS: Trace paranasal sinus mucosal thickening. Mastoid air cells are well aerated.The included ocular globes and orbital contents are non-suspicious. OTHER: None. IMPRESSION: 1. No acute intracranial process. 2. Advanced intracranial atherosclerosis; otherwise negative non-contrast CT HEAD for age. Electronically Signed   By: Elon Alas M.D.   On: 11/26/2017 21:39    Procedures Procedures (including critical care time)  Medications Ordered in ED Medications  HYDROmorphone (DILAUDID) injection 1 mg (1 mg Intramuscular Given 11/26/17 2050)  ondansetron (ZOFRAN-ODT) disintegrating tablet 4 mg (4 mg Oral Given 11/26/17 2050)     Initial  Impression / Assessment and Plan / ED Course  I have reviewed the triage vital signs and the nursing notes.  Pertinent labs & imaging results that were available during my care of the patient were reviewed by me and considered in my medical decision making (see chart for details).     Patient improved with pain medicine nausea medicine.  CT scan of the head and blood work was unremarkable for her history.  Patient will follow-up with PCP  Final Clinical Impressions(s) / ED Diagnoses   Final  diagnoses:  Bad headache    ED Discharge Orders         Ordered    HYDROcodone-acetaminophen (NORCO/VICODIN) 5-325 MG tablet  Every 6 hours PRN     11/26/17 2230    ondansetron (ZOFRAN ODT) 4 MG disintegrating tablet     11/26/17 2230           Milton Ferguson, MD 11/27/17 1626

## 2017-11-28 ENCOUNTER — Ambulatory Visit: Payer: BLUE CROSS/BLUE SHIELD | Admitting: Family Medicine

## 2017-11-28 DIAGNOSIS — Z992 Dependence on renal dialysis: Secondary | ICD-10-CM | POA: Diagnosis not present

## 2017-11-28 DIAGNOSIS — N186 End stage renal disease: Secondary | ICD-10-CM | POA: Diagnosis not present

## 2017-11-28 DIAGNOSIS — R51 Headache: Secondary | ICD-10-CM | POA: Diagnosis not present

## 2017-11-28 DIAGNOSIS — E119 Type 2 diabetes mellitus without complications: Secondary | ICD-10-CM | POA: Diagnosis not present

## 2017-11-29 ENCOUNTER — Encounter: Payer: Self-pay | Admitting: Family

## 2017-11-30 DIAGNOSIS — N186 End stage renal disease: Secondary | ICD-10-CM | POA: Diagnosis not present

## 2017-11-30 DIAGNOSIS — Z992 Dependence on renal dialysis: Secondary | ICD-10-CM | POA: Diagnosis not present

## 2017-12-03 DIAGNOSIS — N186 End stage renal disease: Secondary | ICD-10-CM | POA: Diagnosis not present

## 2017-12-03 DIAGNOSIS — Z992 Dependence on renal dialysis: Secondary | ICD-10-CM | POA: Diagnosis not present

## 2017-12-05 DIAGNOSIS — Z23 Encounter for immunization: Secondary | ICD-10-CM | POA: Diagnosis not present

## 2017-12-05 DIAGNOSIS — Z992 Dependence on renal dialysis: Secondary | ICD-10-CM | POA: Diagnosis not present

## 2017-12-05 DIAGNOSIS — N186 End stage renal disease: Secondary | ICD-10-CM | POA: Diagnosis not present

## 2017-12-07 DIAGNOSIS — Z23 Encounter for immunization: Secondary | ICD-10-CM | POA: Diagnosis not present

## 2017-12-07 DIAGNOSIS — N186 End stage renal disease: Secondary | ICD-10-CM | POA: Diagnosis not present

## 2017-12-07 DIAGNOSIS — Z992 Dependence on renal dialysis: Secondary | ICD-10-CM | POA: Diagnosis not present

## 2017-12-10 DIAGNOSIS — N186 End stage renal disease: Secondary | ICD-10-CM | POA: Diagnosis not present

## 2017-12-10 DIAGNOSIS — Z23 Encounter for immunization: Secondary | ICD-10-CM | POA: Diagnosis not present

## 2017-12-10 DIAGNOSIS — Z992 Dependence on renal dialysis: Secondary | ICD-10-CM | POA: Diagnosis not present

## 2017-12-12 DIAGNOSIS — Z992 Dependence on renal dialysis: Secondary | ICD-10-CM | POA: Diagnosis not present

## 2017-12-12 DIAGNOSIS — N186 End stage renal disease: Secondary | ICD-10-CM | POA: Diagnosis not present

## 2017-12-12 DIAGNOSIS — Z23 Encounter for immunization: Secondary | ICD-10-CM | POA: Diagnosis not present

## 2017-12-14 DIAGNOSIS — N186 End stage renal disease: Secondary | ICD-10-CM | POA: Diagnosis not present

## 2017-12-14 DIAGNOSIS — Z23 Encounter for immunization: Secondary | ICD-10-CM | POA: Diagnosis not present

## 2017-12-14 DIAGNOSIS — Z992 Dependence on renal dialysis: Secondary | ICD-10-CM | POA: Diagnosis not present

## 2017-12-17 DIAGNOSIS — N186 End stage renal disease: Secondary | ICD-10-CM | POA: Diagnosis not present

## 2017-12-17 DIAGNOSIS — Z23 Encounter for immunization: Secondary | ICD-10-CM | POA: Diagnosis not present

## 2017-12-17 DIAGNOSIS — Z992 Dependence on renal dialysis: Secondary | ICD-10-CM | POA: Diagnosis not present

## 2017-12-18 DIAGNOSIS — H25813 Combined forms of age-related cataract, bilateral: Secondary | ICD-10-CM | POA: Diagnosis not present

## 2017-12-18 DIAGNOSIS — E113293 Type 2 diabetes mellitus with mild nonproliferative diabetic retinopathy without macular edema, bilateral: Secondary | ICD-10-CM | POA: Diagnosis not present

## 2017-12-18 LAB — HM DIABETES EYE EXAM

## 2017-12-19 DIAGNOSIS — Z23 Encounter for immunization: Secondary | ICD-10-CM | POA: Diagnosis not present

## 2017-12-19 DIAGNOSIS — Z992 Dependence on renal dialysis: Secondary | ICD-10-CM | POA: Diagnosis not present

## 2017-12-19 DIAGNOSIS — N186 End stage renal disease: Secondary | ICD-10-CM | POA: Diagnosis not present

## 2017-12-21 DIAGNOSIS — N186 End stage renal disease: Secondary | ICD-10-CM | POA: Diagnosis not present

## 2017-12-21 DIAGNOSIS — Z23 Encounter for immunization: Secondary | ICD-10-CM | POA: Diagnosis not present

## 2017-12-21 DIAGNOSIS — Z992 Dependence on renal dialysis: Secondary | ICD-10-CM | POA: Diagnosis not present

## 2017-12-24 DIAGNOSIS — Z23 Encounter for immunization: Secondary | ICD-10-CM | POA: Diagnosis not present

## 2017-12-24 DIAGNOSIS — Z992 Dependence on renal dialysis: Secondary | ICD-10-CM | POA: Diagnosis not present

## 2017-12-24 DIAGNOSIS — N186 End stage renal disease: Secondary | ICD-10-CM | POA: Diagnosis not present

## 2017-12-26 DIAGNOSIS — Z23 Encounter for immunization: Secondary | ICD-10-CM | POA: Diagnosis not present

## 2017-12-26 DIAGNOSIS — Z992 Dependence on renal dialysis: Secondary | ICD-10-CM | POA: Diagnosis not present

## 2017-12-26 DIAGNOSIS — N186 End stage renal disease: Secondary | ICD-10-CM | POA: Diagnosis not present

## 2017-12-28 DIAGNOSIS — Z23 Encounter for immunization: Secondary | ICD-10-CM | POA: Diagnosis not present

## 2017-12-28 DIAGNOSIS — Z992 Dependence on renal dialysis: Secondary | ICD-10-CM | POA: Diagnosis not present

## 2017-12-28 DIAGNOSIS — N186 End stage renal disease: Secondary | ICD-10-CM | POA: Diagnosis not present

## 2017-12-29 ENCOUNTER — Other Ambulatory Visit: Payer: Self-pay | Admitting: Family

## 2017-12-31 DIAGNOSIS — Z23 Encounter for immunization: Secondary | ICD-10-CM | POA: Diagnosis not present

## 2017-12-31 DIAGNOSIS — N186 End stage renal disease: Secondary | ICD-10-CM | POA: Diagnosis not present

## 2017-12-31 DIAGNOSIS — Z992 Dependence on renal dialysis: Secondary | ICD-10-CM | POA: Diagnosis not present

## 2017-12-31 NOTE — Telephone Encounter (Signed)
Last seen 910/19 

## 2018-01-02 DIAGNOSIS — N186 End stage renal disease: Secondary | ICD-10-CM | POA: Diagnosis not present

## 2018-01-02 DIAGNOSIS — Z23 Encounter for immunization: Secondary | ICD-10-CM | POA: Diagnosis not present

## 2018-01-02 DIAGNOSIS — Z992 Dependence on renal dialysis: Secondary | ICD-10-CM | POA: Diagnosis not present

## 2018-01-03 DIAGNOSIS — N186 End stage renal disease: Secondary | ICD-10-CM | POA: Diagnosis not present

## 2018-01-03 DIAGNOSIS — Z992 Dependence on renal dialysis: Secondary | ICD-10-CM | POA: Diagnosis not present

## 2018-01-04 DIAGNOSIS — Z992 Dependence on renal dialysis: Secondary | ICD-10-CM | POA: Diagnosis not present

## 2018-01-04 DIAGNOSIS — N186 End stage renal disease: Secondary | ICD-10-CM | POA: Diagnosis not present

## 2018-01-07 DIAGNOSIS — Z992 Dependence on renal dialysis: Secondary | ICD-10-CM | POA: Diagnosis not present

## 2018-01-07 DIAGNOSIS — N186 End stage renal disease: Secondary | ICD-10-CM | POA: Diagnosis not present

## 2018-01-08 DIAGNOSIS — Z992 Dependence on renal dialysis: Secondary | ICD-10-CM | POA: Diagnosis not present

## 2018-01-08 DIAGNOSIS — I10 Essential (primary) hypertension: Secondary | ICD-10-CM | POA: Diagnosis not present

## 2018-01-08 DIAGNOSIS — Z01818 Encounter for other preprocedural examination: Secondary | ICD-10-CM | POA: Diagnosis not present

## 2018-01-08 DIAGNOSIS — N186 End stage renal disease: Secondary | ICD-10-CM | POA: Diagnosis not present

## 2018-01-08 DIAGNOSIS — I120 Hypertensive chronic kidney disease with stage 5 chronic kidney disease or end stage renal disease: Secondary | ICD-10-CM | POA: Diagnosis not present

## 2018-01-08 DIAGNOSIS — I517 Cardiomegaly: Secondary | ICD-10-CM | POA: Diagnosis not present

## 2018-01-08 DIAGNOSIS — E1122 Type 2 diabetes mellitus with diabetic chronic kidney disease: Secondary | ICD-10-CM | POA: Diagnosis not present

## 2018-01-08 DIAGNOSIS — Z794 Long term (current) use of insulin: Secondary | ICD-10-CM | POA: Diagnosis not present

## 2018-01-08 DIAGNOSIS — R9431 Abnormal electrocardiogram [ECG] [EKG]: Secondary | ICD-10-CM | POA: Diagnosis not present

## 2018-01-09 DIAGNOSIS — Z992 Dependence on renal dialysis: Secondary | ICD-10-CM | POA: Diagnosis not present

## 2018-01-09 DIAGNOSIS — N186 End stage renal disease: Secondary | ICD-10-CM | POA: Diagnosis not present

## 2018-01-11 DIAGNOSIS — N186 End stage renal disease: Secondary | ICD-10-CM | POA: Diagnosis not present

## 2018-01-11 DIAGNOSIS — Z992 Dependence on renal dialysis: Secondary | ICD-10-CM | POA: Diagnosis not present

## 2018-01-14 DIAGNOSIS — N186 End stage renal disease: Secondary | ICD-10-CM | POA: Diagnosis not present

## 2018-01-14 DIAGNOSIS — Z992 Dependence on renal dialysis: Secondary | ICD-10-CM | POA: Diagnosis not present

## 2018-01-15 DIAGNOSIS — T82858A Stenosis of vascular prosthetic devices, implants and grafts, initial encounter: Secondary | ICD-10-CM | POA: Diagnosis not present

## 2018-01-15 DIAGNOSIS — T82590A Other mechanical complication of surgically created arteriovenous fistula, initial encounter: Secondary | ICD-10-CM | POA: Diagnosis not present

## 2018-01-15 DIAGNOSIS — X58XXXA Exposure to other specified factors, initial encounter: Secondary | ICD-10-CM | POA: Diagnosis not present

## 2018-01-16 DIAGNOSIS — N186 End stage renal disease: Secondary | ICD-10-CM | POA: Diagnosis not present

## 2018-01-16 DIAGNOSIS — Z992 Dependence on renal dialysis: Secondary | ICD-10-CM | POA: Diagnosis not present

## 2018-01-18 DIAGNOSIS — N186 End stage renal disease: Secondary | ICD-10-CM | POA: Diagnosis not present

## 2018-01-18 DIAGNOSIS — Z992 Dependence on renal dialysis: Secondary | ICD-10-CM | POA: Diagnosis not present

## 2018-01-21 DIAGNOSIS — Z992 Dependence on renal dialysis: Secondary | ICD-10-CM | POA: Diagnosis not present

## 2018-01-21 DIAGNOSIS — N186 End stage renal disease: Secondary | ICD-10-CM | POA: Diagnosis not present

## 2018-01-23 DIAGNOSIS — Z992 Dependence on renal dialysis: Secondary | ICD-10-CM | POA: Diagnosis not present

## 2018-01-23 DIAGNOSIS — N186 End stage renal disease: Secondary | ICD-10-CM | POA: Diagnosis not present

## 2018-01-25 DIAGNOSIS — Z992 Dependence on renal dialysis: Secondary | ICD-10-CM | POA: Diagnosis not present

## 2018-01-25 DIAGNOSIS — N186 End stage renal disease: Secondary | ICD-10-CM | POA: Diagnosis not present

## 2018-01-28 DIAGNOSIS — Z992 Dependence on renal dialysis: Secondary | ICD-10-CM | POA: Diagnosis not present

## 2018-01-28 DIAGNOSIS — N186 End stage renal disease: Secondary | ICD-10-CM | POA: Diagnosis not present

## 2018-01-29 ENCOUNTER — Encounter: Payer: Self-pay | Admitting: Family

## 2018-01-29 ENCOUNTER — Ambulatory Visit: Payer: BLUE CROSS/BLUE SHIELD | Admitting: Family

## 2018-01-29 VITALS — BP 132/51 | HR 56 | Temp 97.3°F | Ht 69.0 in | Wt 149.4 lb

## 2018-01-29 DIAGNOSIS — I5032 Chronic diastolic (congestive) heart failure: Secondary | ICD-10-CM

## 2018-01-29 DIAGNOSIS — E1169 Type 2 diabetes mellitus with other specified complication: Secondary | ICD-10-CM | POA: Diagnosis not present

## 2018-01-29 DIAGNOSIS — Z794 Long term (current) use of insulin: Secondary | ICD-10-CM

## 2018-01-29 DIAGNOSIS — E1159 Type 2 diabetes mellitus with other circulatory complications: Secondary | ICD-10-CM

## 2018-01-29 DIAGNOSIS — E1165 Type 2 diabetes mellitus with hyperglycemia: Secondary | ICD-10-CM | POA: Diagnosis not present

## 2018-01-29 DIAGNOSIS — E785 Hyperlipidemia, unspecified: Secondary | ICD-10-CM

## 2018-01-29 DIAGNOSIS — I152 Hypertension secondary to endocrine disorders: Secondary | ICD-10-CM

## 2018-01-29 DIAGNOSIS — N186 End stage renal disease: Secondary | ICD-10-CM

## 2018-01-29 DIAGNOSIS — Z1211 Encounter for screening for malignant neoplasm of colon: Secondary | ICD-10-CM

## 2018-01-29 DIAGNOSIS — Z1212 Encounter for screening for malignant neoplasm of rectum: Secondary | ICD-10-CM

## 2018-01-29 DIAGNOSIS — I10 Essential (primary) hypertension: Secondary | ICD-10-CM | POA: Diagnosis not present

## 2018-01-29 DIAGNOSIS — Z992 Dependence on renal dialysis: Secondary | ICD-10-CM

## 2018-01-29 LAB — BAYER DCA HB A1C WAIVED: HB A1C (BAYER DCA - WAIVED): 6.5 % (ref <7.0)

## 2018-01-29 NOTE — Patient Instructions (Signed)

## 2018-01-29 NOTE — Progress Notes (Signed)
Subjective:    Patient ID: [name], female    DOB: 10-May-1955, 62 y.o.   MRN: [id number]  Chief Complaint  Patient presents with  . Medical Management of Chronic Issues    four month recheck   PT presents to the office todayfor chronic follow up.Pt is followed by Cardiologists annually for CHF and aortic valve sclerosis that is stable.   Pt currently on dialysis Monday, Wednesday, and Friday and followed by nephrologists there. 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 Diabetes  She presents for her follow-up diabetic visit. She has type 2 diabetes mellitus. Her disease course has been stable. There are no hypoglycemic associated symptoms. Associated symptoms include foot paresthesias. Pertinent negatives for diabetes include no blurred vision. Symptoms are stable. Diabetic complications include heart disease. Risk factors for coronary artery disease include dyslipidemia, diabetes mellitus, obesity and sedentary lifestyle. She is following a generally healthy diet. Her overall blood glucose range is 130-140 mg/dl. Eye exam is current.  Hyperlipidemia  This is a chronic problem. The current episode started more than 1 year ago. The problem is controlled. Recent lipid tests were reviewed and are normal. Exacerbating diseases include obesity. Current antihyperlipidemic treatment includes statins. The current treatment provides moderate improvement of lipids. Risk factors for coronary artery disease include dyslipidemia, diabetes mellitus and hypertension.      Review of Systems  Eyes: Negative for blurred vision.  All other systems reviewed and are negative.      Objective:   Physical Exam  Constitutional: She is oriented to person, place, and time. She appears well-developed and well-nourished. No distress.  HENT:  Head: Normocephalic and atraumatic.  Right Ear: External ear normal.  Left Ear: External ear normal.  Mouth/Throat: Oropharynx is clear and moist.    Eyes: Pupils are equal, round, and reactive to light.  Neck: Normal range of motion. Neck supple. No thyromegaly present.  Cardiovascular: Normal rate, regular rhythm, normal heart sounds and intact distal pulses.  No murmur heard. Pulmonary/Chest: Effort normal and breath sounds normal. No respiratory distress. She has no wheezes.  Abdominal: Soft. Bowel sounds are normal. She exhibits no distension. There is no tenderness.  Musculoskeletal: Normal range of motion. She exhibits no edema or tenderness.  Neurological: She is alert and oriented to person, place, and time. She has normal reflexes. No cranial nerve deficit.  Skin: Skin is warm and dry. Ecchymosis noted.  Psychiatric: She has a normal mood and affect. Her behavior is normal. Judgment and thought content normal.  Vitals reviewed.     BP (!) 132/51   Pulse (!) 56   Temp (!) 97.3 F (36.3 C) (Oral)   Ht '5\' 9"'$  (1.753 m)   Wt 149 lb 6.4 oz (67.8 kg)   BMI 22.06 kg/m      Assessment & Plan:  [name] comes in today with chief complaint of Medical Management of Chronic Issues (four month recheck)   Diagnosis and orders addressed:  1. Hypertension associated with diabetes ([place]) - CMP14+EGFR - CBC with Differential/Platelet  2. Chronic diastolic heart failure ([facility]) - CMP14+EGFR - CBC with Differential/Platelet  3. Type 2 diabetes mellitus with hyperglycemia, with long-term current use of insulin ([facility]) - CMP14+EGFR - CBC with Differential/Platelet - Bayer DCA Hb A1c Waived  4. Hyperlipidemia associated with type 2 diabetes mellitus ([facility]) - CMP14+EGFR - CBC with Differential/Platelet - Lipid panel - Microalbumin / creatinine urine ratio  5. Chronic kidney disease on chronic dialysis ([facility]) - CMP14+EGFR -  CBC with Differential/Platelet  6. Colon cancer screening - Cologuard  7. Screening for malignant neoplasm of the rectum - Cologuard   Labs pending Health Maintenance reviewed Diet and exercise  encouraged  Follow up plan: 6 months    Evelina Dun, FNP

## 2018-01-30 DIAGNOSIS — Z992 Dependence on renal dialysis: Secondary | ICD-10-CM | POA: Diagnosis not present

## 2018-01-30 DIAGNOSIS — N186 End stage renal disease: Secondary | ICD-10-CM | POA: Diagnosis not present

## 2018-01-30 LAB — CMP14+EGFR
ALBUMIN: 4 g/dL (ref 3.6–4.8)
ALT: 21 IU/L (ref 0–32)
AST: 20 IU/L (ref 0–40)
Albumin/Globulin Ratio: 1.4 (ref 1.2–2.2)
Alkaline Phosphatase: 126 IU/L — ABNORMAL HIGH (ref 39–117)
BILIRUBIN TOTAL: 0.4 mg/dL (ref 0.0–1.2)
BUN / CREAT RATIO: 5 — AB (ref 12–28)
BUN: 33 mg/dL — AB (ref 8–27)
CO2: 26 mmol/L (ref 20–29)
CREATININE: 6.87 mg/dL — AB (ref 0.57–1.00)
Calcium: 8.5 mg/dL — ABNORMAL LOW (ref 8.7–10.3)
Chloride: 96 mmol/L (ref 96–106)
GFR calc non Af Amer: 6 mL/min/{1.73_m2} — ABNORMAL LOW (ref >59)
GFR, EST AFRICAN AMERICAN: 7 mL/min/{1.73_m2} — AB (ref >59)
GLOBULIN, TOTAL: 2.9 g/dL (ref 1.5–4.5)
GLUCOSE: 132 mg/dL — AB (ref 65–99)
Potassium: 5.2 mmol/L (ref 3.5–5.2)
SODIUM: 139 mmol/L (ref 134–144)
TOTAL PROTEIN: 6.9 g/dL (ref 6.0–8.5)

## 2018-01-30 LAB — LIPID PANEL
CHOL/HDL RATIO: 2.2 ratio (ref 0.0–4.4)
Cholesterol, Total: 95 mg/dL — ABNORMAL LOW (ref 100–199)
HDL: 44 mg/dL (ref >39)
LDL Calculated: 30 mg/dL (ref 0–99)
Triglycerides: 106 mg/dL (ref 0–149)
VLDL Cholesterol Cal: 21 mg/dL (ref 5–40)

## 2018-01-30 LAB — CBC WITH DIFFERENTIAL/PLATELET
Basophils Absolute: 0.1 10*3/uL (ref 0.0–0.2)
Basos: 1 %
EOS (ABSOLUTE): 0.4 10*3/uL (ref 0.0–0.4)
EOS: 6 %
Hematocrit: 40.2 % (ref 34.0–46.6)
Hemoglobin: 13.1 g/dL (ref 11.1–15.9)
Immature Grans (Abs): 0 10*3/uL (ref 0.0–0.1)
Immature Granulocytes: 0 %
LYMPHS ABS: 1 10*3/uL (ref 0.7–3.1)
Lymphs: 13 %
MCH: 31.1 pg (ref 26.6–33.0)
MCHC: 32.6 g/dL (ref 31.5–35.7)
MCV: 96 fL (ref 79–97)
MONOCYTES: 9 %
MONOS ABS: 0.6 10*3/uL (ref 0.1–0.9)
NEUTROS ABS: 5.3 10*3/uL (ref 1.4–7.0)
Neutrophils: 71 %
PLATELETS: 193 10*3/uL (ref 150–450)
RBC: 4.21 x10E6/uL (ref 3.77–5.28)
RDW: 13.1 % (ref 12.3–15.4)
WBC: 7.4 10*3/uL (ref 3.4–10.8)

## 2018-01-30 LAB — MICROALBUMIN / CREATININE URINE RATIO
CREATININE, UR: 176.5 mg/dL
MICROALB/CREAT RATIO: 174.8 mg/g{creat} — AB (ref 0.0–30.0)
MICROALBUM., U, RANDOM: 308.5 ug/mL

## 2018-01-30 LAB — SPECIMEN STATUS REPORT

## 2018-02-01 DIAGNOSIS — Z992 Dependence on renal dialysis: Secondary | ICD-10-CM | POA: Diagnosis not present

## 2018-02-01 DIAGNOSIS — N186 End stage renal disease: Secondary | ICD-10-CM | POA: Diagnosis not present

## 2018-02-02 DIAGNOSIS — Z992 Dependence on renal dialysis: Secondary | ICD-10-CM | POA: Diagnosis not present

## 2018-02-02 DIAGNOSIS — N186 End stage renal disease: Secondary | ICD-10-CM | POA: Diagnosis not present

## 2018-02-04 DIAGNOSIS — Z992 Dependence on renal dialysis: Secondary | ICD-10-CM | POA: Diagnosis not present

## 2018-02-04 DIAGNOSIS — N186 End stage renal disease: Secondary | ICD-10-CM | POA: Diagnosis not present

## 2018-02-06 DIAGNOSIS — Z992 Dependence on renal dialysis: Secondary | ICD-10-CM | POA: Diagnosis not present

## 2018-02-06 DIAGNOSIS — N186 End stage renal disease: Secondary | ICD-10-CM | POA: Diagnosis not present

## 2018-02-08 DIAGNOSIS — N186 End stage renal disease: Secondary | ICD-10-CM | POA: Diagnosis not present

## 2018-02-08 DIAGNOSIS — Z992 Dependence on renal dialysis: Secondary | ICD-10-CM | POA: Diagnosis not present

## 2018-02-11 DIAGNOSIS — N186 End stage renal disease: Secondary | ICD-10-CM | POA: Diagnosis not present

## 2018-02-11 DIAGNOSIS — Z992 Dependence on renal dialysis: Secondary | ICD-10-CM | POA: Diagnosis not present

## 2018-02-13 DIAGNOSIS — Z992 Dependence on renal dialysis: Secondary | ICD-10-CM | POA: Diagnosis not present

## 2018-02-13 DIAGNOSIS — N186 End stage renal disease: Secondary | ICD-10-CM | POA: Diagnosis not present

## 2018-02-15 DIAGNOSIS — Z992 Dependence on renal dialysis: Secondary | ICD-10-CM | POA: Diagnosis not present

## 2018-02-15 DIAGNOSIS — N186 End stage renal disease: Secondary | ICD-10-CM | POA: Diagnosis not present

## 2018-02-18 DIAGNOSIS — Z992 Dependence on renal dialysis: Secondary | ICD-10-CM | POA: Diagnosis not present

## 2018-02-18 DIAGNOSIS — N186 End stage renal disease: Secondary | ICD-10-CM | POA: Diagnosis not present

## 2018-02-20 DIAGNOSIS — Z992 Dependence on renal dialysis: Secondary | ICD-10-CM | POA: Diagnosis not present

## 2018-02-20 DIAGNOSIS — N186 End stage renal disease: Secondary | ICD-10-CM | POA: Diagnosis not present

## 2018-02-22 DIAGNOSIS — Z992 Dependence on renal dialysis: Secondary | ICD-10-CM | POA: Diagnosis not present

## 2018-02-22 DIAGNOSIS — N186 End stage renal disease: Secondary | ICD-10-CM | POA: Diagnosis not present

## 2018-02-24 DIAGNOSIS — Z992 Dependence on renal dialysis: Secondary | ICD-10-CM | POA: Diagnosis not present

## 2018-02-24 DIAGNOSIS — N186 End stage renal disease: Secondary | ICD-10-CM | POA: Diagnosis not present

## 2018-02-26 DIAGNOSIS — Z992 Dependence on renal dialysis: Secondary | ICD-10-CM | POA: Diagnosis not present

## 2018-02-26 DIAGNOSIS — N186 End stage renal disease: Secondary | ICD-10-CM | POA: Diagnosis not present

## 2018-03-01 DIAGNOSIS — N186 End stage renal disease: Secondary | ICD-10-CM | POA: Diagnosis not present

## 2018-03-01 DIAGNOSIS — Z992 Dependence on renal dialysis: Secondary | ICD-10-CM | POA: Diagnosis not present

## 2018-03-03 DIAGNOSIS — R1110 Vomiting, unspecified: Secondary | ICD-10-CM | POA: Diagnosis not present

## 2018-03-03 DIAGNOSIS — R531 Weakness: Secondary | ICD-10-CM | POA: Diagnosis not present

## 2018-03-03 DIAGNOSIS — N189 Chronic kidney disease, unspecified: Secondary | ICD-10-CM | POA: Diagnosis not present

## 2018-03-03 DIAGNOSIS — Z79899 Other long term (current) drug therapy: Secondary | ICD-10-CM | POA: Diagnosis not present

## 2018-03-03 DIAGNOSIS — I130 Hypertensive heart and chronic kidney disease with heart failure and stage 1 through stage 4 chronic kidney disease, or unspecified chronic kidney disease: Secondary | ICD-10-CM | POA: Diagnosis not present

## 2018-03-03 DIAGNOSIS — Z7982 Long term (current) use of aspirin: Secondary | ICD-10-CM | POA: Diagnosis not present

## 2018-03-03 DIAGNOSIS — Z940 Kidney transplant status: Secondary | ICD-10-CM | POA: Diagnosis not present

## 2018-03-03 DIAGNOSIS — E1122 Type 2 diabetes mellitus with diabetic chronic kidney disease: Secondary | ICD-10-CM | POA: Diagnosis not present

## 2018-03-03 DIAGNOSIS — R42 Dizziness and giddiness: Secondary | ICD-10-CM | POA: Diagnosis not present

## 2018-03-03 DIAGNOSIS — Z992 Dependence on renal dialysis: Secondary | ICD-10-CM | POA: Diagnosis not present

## 2018-03-03 DIAGNOSIS — I509 Heart failure, unspecified: Secondary | ICD-10-CM | POA: Diagnosis not present

## 2018-03-03 DIAGNOSIS — K219 Gastro-esophageal reflux disease without esophagitis: Secondary | ICD-10-CM | POA: Diagnosis not present

## 2018-03-04 ENCOUNTER — Telehealth: Payer: Self-pay

## 2018-03-04 DIAGNOSIS — N186 End stage renal disease: Secondary | ICD-10-CM | POA: Diagnosis not present

## 2018-03-04 DIAGNOSIS — Z992 Dependence on renal dialysis: Secondary | ICD-10-CM | POA: Diagnosis not present

## 2018-03-04 NOTE — Telephone Encounter (Signed)
Daughter called stating that mother was currently at dialysis and had been off the machine for about an hour. Every time she stands up she is very dizzy and just doesn't seem to feel well. Daughter states she has been like this since Christmas Day. They went to [facility] ER last night and had numerous tests done and they found nothing. Daughter is concerned that something has been missed. Spoke with provider here at office and they advised for her to go to [facility]. Daughter notified and verbalized understanding.

## 2018-03-05 DIAGNOSIS — N186 End stage renal disease: Secondary | ICD-10-CM | POA: Diagnosis not present

## 2018-03-05 DIAGNOSIS — Z992 Dependence on renal dialysis: Secondary | ICD-10-CM | POA: Diagnosis not present

## 2018-03-06 DIAGNOSIS — Z992 Dependence on renal dialysis: Secondary | ICD-10-CM | POA: Diagnosis not present

## 2018-03-06 DIAGNOSIS — N186 End stage renal disease: Secondary | ICD-10-CM | POA: Diagnosis not present

## 2018-03-08 DIAGNOSIS — Z992 Dependence on renal dialysis: Secondary | ICD-10-CM | POA: Diagnosis not present

## 2018-03-08 DIAGNOSIS — N186 End stage renal disease: Secondary | ICD-10-CM | POA: Diagnosis not present

## 2018-03-11 ENCOUNTER — Other Ambulatory Visit: Payer: Self-pay

## 2018-03-11 ENCOUNTER — Inpatient Hospital Stay (HOSPITAL_COMMUNITY)
Admission: EM | Admit: 2018-03-11 | Discharge: 2018-03-13 | DRG: 698 | Disposition: A | Discharge status: Home / Self Care | Payer: BLUE CROSS/BLUE SHIELD | Attending: Internal Medicine | Admitting: Internal Medicine

## 2018-03-11 ENCOUNTER — Emergency Department (HOSPITAL_COMMUNITY): Payer: BLUE CROSS/BLUE SHIELD

## 2018-03-11 ENCOUNTER — Encounter (HOSPITAL_COMMUNITY): Payer: Self-pay | Admitting: *Deleted

## 2018-03-11 DIAGNOSIS — Q211 Atrial septal defect: Secondary | ICD-10-CM | POA: Diagnosis not present

## 2018-03-11 DIAGNOSIS — Z841 Family history of disorders of kidney and ureter: Secondary | ICD-10-CM | POA: Diagnosis not present

## 2018-03-11 DIAGNOSIS — Y830 Surgical operation with transplant of whole organ as the cause of abnormal reaction of the patient, or of later complication, without mention of misadventure at the time of the procedure: Secondary | ICD-10-CM | POA: Diagnosis present

## 2018-03-11 DIAGNOSIS — Z79891 Long term (current) use of opiate analgesic: Secondary | ICD-10-CM

## 2018-03-11 DIAGNOSIS — R7989 Other specified abnormal findings of blood chemistry: Secondary | ICD-10-CM | POA: Diagnosis present

## 2018-03-11 DIAGNOSIS — Z833 Family history of diabetes mellitus: Secondary | ICD-10-CM | POA: Diagnosis not present

## 2018-03-11 DIAGNOSIS — R7881 Bacteremia: Secondary | ICD-10-CM | POA: Diagnosis present

## 2018-03-11 DIAGNOSIS — E1159 Type 2 diabetes mellitus with other circulatory complications: Secondary | ICD-10-CM | POA: Diagnosis not present

## 2018-03-11 DIAGNOSIS — I132 Hypertensive heart and chronic kidney disease with heart failure and with stage 5 chronic kidney disease, or end stage renal disease: Secondary | ICD-10-CM | POA: Diagnosis not present

## 2018-03-11 DIAGNOSIS — D638 Anemia in other chronic diseases classified elsewhere: Secondary | ICD-10-CM | POA: Diagnosis not present

## 2018-03-11 DIAGNOSIS — E1122 Type 2 diabetes mellitus with diabetic chronic kidney disease: Secondary | ICD-10-CM | POA: Diagnosis not present

## 2018-03-11 DIAGNOSIS — Z794 Long term (current) use of insulin: Secondary | ICD-10-CM

## 2018-03-11 DIAGNOSIS — E8889 Other specified metabolic disorders: Secondary | ICD-10-CM | POA: Diagnosis present

## 2018-03-11 DIAGNOSIS — R945 Abnormal results of liver function studies: Secondary | ICD-10-CM

## 2018-03-11 DIAGNOSIS — T8613 Kidney transplant infection: Secondary | ICD-10-CM | POA: Diagnosis not present

## 2018-03-11 DIAGNOSIS — Z7952 Long term (current) use of systemic steroids: Secondary | ICD-10-CM | POA: Diagnosis not present

## 2018-03-11 DIAGNOSIS — I5032 Chronic diastolic (congestive) heart failure: Secondary | ICD-10-CM | POA: Diagnosis not present

## 2018-03-11 DIAGNOSIS — N136 Pyonephrosis: Secondary | ICD-10-CM | POA: Diagnosis not present

## 2018-03-11 DIAGNOSIS — E119 Type 2 diabetes mellitus without complications: Secondary | ICD-10-CM

## 2018-03-11 DIAGNOSIS — D631 Anemia in chronic kidney disease: Secondary | ICD-10-CM | POA: Diagnosis present

## 2018-03-11 DIAGNOSIS — N1330 Unspecified hydronephrosis: Secondary | ICD-10-CM | POA: Diagnosis not present

## 2018-03-11 DIAGNOSIS — E1169 Type 2 diabetes mellitus with other specified complication: Secondary | ICD-10-CM | POA: Diagnosis not present

## 2018-03-11 DIAGNOSIS — Z7982 Long term (current) use of aspirin: Secondary | ICD-10-CM

## 2018-03-11 DIAGNOSIS — N186 End stage renal disease: Secondary | ICD-10-CM | POA: Diagnosis present

## 2018-03-11 DIAGNOSIS — Z8249 Family history of ischemic heart disease and other diseases of the circulatory system: Secondary | ICD-10-CM

## 2018-03-11 DIAGNOSIS — N10 Acute tubulo-interstitial nephritis: Secondary | ICD-10-CM | POA: Diagnosis not present

## 2018-03-11 DIAGNOSIS — I10 Essential (primary) hypertension: Secondary | ICD-10-CM | POA: Diagnosis not present

## 2018-03-11 DIAGNOSIS — T8612 Kidney transplant failure: Secondary | ICD-10-CM | POA: Diagnosis present

## 2018-03-11 DIAGNOSIS — I2510 Atherosclerotic heart disease of native coronary artery without angina pectoris: Secondary | ICD-10-CM | POA: Diagnosis not present

## 2018-03-11 DIAGNOSIS — I38 Endocarditis, valve unspecified: Secondary | ICD-10-CM | POA: Diagnosis present

## 2018-03-11 DIAGNOSIS — E785 Hyperlipidemia, unspecified: Secondary | ICD-10-CM | POA: Diagnosis not present

## 2018-03-11 DIAGNOSIS — Z992 Dependence on renal dialysis: Secondary | ICD-10-CM | POA: Diagnosis not present

## 2018-03-11 DIAGNOSIS — I272 Pulmonary hypertension, unspecified: Secondary | ICD-10-CM | POA: Diagnosis not present

## 2018-03-11 DIAGNOSIS — Z91048 Other nonmedicinal substance allergy status: Secondary | ICD-10-CM

## 2018-03-11 DIAGNOSIS — K5900 Constipation, unspecified: Secondary | ICD-10-CM | POA: Diagnosis present

## 2018-03-11 DIAGNOSIS — K520 Gastroenteritis and colitis due to radiation: Secondary | ICD-10-CM | POA: Diagnosis not present

## 2018-03-11 DIAGNOSIS — I120 Hypertensive chronic kidney disease with stage 5 chronic kidney disease or end stage renal disease: Secondary | ICD-10-CM | POA: Diagnosis not present

## 2018-03-11 DIAGNOSIS — N12 Tubulo-interstitial nephritis, not specified as acute or chronic: Secondary | ICD-10-CM

## 2018-03-11 DIAGNOSIS — Z79899 Other long term (current) drug therapy: Secondary | ICD-10-CM

## 2018-03-11 DIAGNOSIS — I083 Combined rheumatic disorders of mitral, aortic and tricuspid valves: Secondary | ICD-10-CM | POA: Diagnosis not present

## 2018-03-11 DIAGNOSIS — R509 Fever, unspecified: Secondary | ICD-10-CM | POA: Diagnosis present

## 2018-03-11 HISTORY — DX: Abnormal results of liver function studies: R94.5

## 2018-03-11 HISTORY — DX: Other specified abnormal findings of blood chemistry: R79.89

## 2018-03-11 LAB — CBC WITH DIFFERENTIAL/PLATELET
Abs Immature Granulocytes: 0.04 10*3/uL (ref 0.00–0.07)
BASOS ABS: 0 10*3/uL (ref 0.0–0.1)
Basophils Relative: 1 %
EOS ABS: 0.4 10*3/uL (ref 0.0–0.5)
EOS PCT: 6 %
HCT: 34.3 % — ABNORMAL LOW (ref 36.0–46.0)
HEMOGLOBIN: 10.7 g/dL — AB (ref 12.0–15.0)
Immature Granulocytes: 1 %
LYMPHS ABS: 0.5 10*3/uL — AB (ref 0.7–4.0)
LYMPHS PCT: 6 %
MCH: 29.7 pg (ref 26.0–34.0)
MCHC: 31.2 g/dL (ref 30.0–36.0)
MCV: 95.3 fL (ref 80.0–100.0)
Monocytes Absolute: 0.6 10*3/uL (ref 0.1–1.0)
Monocytes Relative: 8 %
NRBC: 0 % (ref 0.0–0.2)
Neutro Abs: 6.1 10*3/uL (ref 1.7–7.7)
Neutrophils Relative %: 78 %
Platelets: 131 10*3/uL — ABNORMAL LOW (ref 150–400)
RBC: 3.6 MIL/uL — AB (ref 3.87–5.11)
RDW: 12.7 % (ref 11.5–15.5)
WBC: 7.7 10*3/uL (ref 4.0–10.5)

## 2018-03-11 LAB — COMPREHENSIVE METABOLIC PANEL
ALBUMIN: 3.3 g/dL — AB (ref 3.5–5.0)
ALK PHOS: 118 U/L (ref 38–126)
ALT: 48 U/L — ABNORMAL HIGH (ref 0–44)
ANION GAP: 11 (ref 5–15)
AST: 80 U/L — AB (ref 15–41)
BILIRUBIN TOTAL: 0.5 mg/dL (ref 0.3–1.2)
BUN: 42 mg/dL — ABNORMAL HIGH (ref 8–23)
CALCIUM: 8.5 mg/dL — AB (ref 8.9–10.3)
CO2: 24 mmol/L (ref 22–32)
Chloride: 103 mmol/L (ref 98–111)
Creatinine, Ser: 9.3 mg/dL — ABNORMAL HIGH (ref 0.44–1.00)
GFR calc non Af Amer: 4 mL/min — ABNORMAL LOW (ref >60)
GFR, EST AFRICAN AMERICAN: 5 mL/min — AB (ref >60)
Glucose, Bld: 270 mg/dL — ABNORMAL HIGH (ref 70–99)
Potassium: 4.5 mmol/L (ref 3.5–5.1)
SODIUM: 138 mmol/L (ref 135–145)
TOTAL PROTEIN: 7 g/dL (ref 6.5–8.1)

## 2018-03-11 LAB — URINALYSIS, ROUTINE W REFLEX MICROSCOPIC
Bilirubin Urine: NEGATIVE
Glucose, UA: 150 mg/dL — AB
KETONES UR: NEGATIVE mg/dL
NITRITE: NEGATIVE
PH: 6 (ref 5.0–8.0)
Protein, ur: 100 mg/dL — AB
SPECIFIC GRAVITY, URINE: 1.016 (ref 1.005–1.030)

## 2018-03-11 LAB — LIPASE, BLOOD: Lipase: 54 U/L — ABNORMAL HIGH (ref 11–51)

## 2018-03-11 LAB — GLUCOSE, CAPILLARY
GLUCOSE-CAPILLARY: 110 mg/dL — AB (ref 70–99)
Glucose-Capillary: 122 mg/dL — ABNORMAL HIGH (ref 70–99)

## 2018-03-11 LAB — HEMOGLOBIN A1C
Hgb A1c MFr Bld: 8.1 % — ABNORMAL HIGH (ref 4.8–5.6)
Mean Plasma Glucose: 185.77 mg/dL

## 2018-03-11 LAB — I-STAT CG4 LACTIC ACID, ED: Lactic Acid, Venous: 1.6 mmol/L (ref 0.5–1.9)

## 2018-03-11 LAB — CBG MONITORING, ED: Glucose-Capillary: 130 mg/dL — ABNORMAL HIGH (ref 70–99)

## 2018-03-11 MED ORDER — SODIUM CHLORIDE 0.9 % IV SOLN: 100.0000 mL | INTRAVENOUS | Status: DC | PRN

## 2018-03-11 MED ORDER — ACETAMINOPHEN 325 MG PO TABS
650.0000 mg | ORAL_TABLET | Freq: Once | ORAL | Status: AC
  Administered 2018-03-11: 650 mg via ORAL
  Filled 2018-03-11: qty 2

## 2018-03-11 MED ORDER — PREDNISONE 10 MG PO TABS
5.0000 mg | ORAL_TABLET | Freq: Every day | ORAL | Status: DC
  Administered 2018-03-12 – 2018-03-13 (×2): 5 mg via ORAL
  Filled 2018-03-11 (×3): qty 1

## 2018-03-11 MED ORDER — FENTANYL CITRATE (PF) 100 MCG/2ML IJ SOLN
100.0000 ug | Freq: Once | INTRAMUSCULAR | Status: AC
  Administered 2018-03-11: 100 ug via INTRAVENOUS
  Filled 2018-03-11: qty 2

## 2018-03-11 MED ORDER — LIDOCAINE HCL (PF) 1 % IJ SOLN: 5.0000 mL | INTRAMUSCULAR | Status: DC | PRN

## 2018-03-11 MED ORDER — ACETAMINOPHEN 650 MG RE SUPP: 650.0000 mg | Freq: Four times a day (QID) | RECTAL | Status: DC | PRN

## 2018-03-11 MED ORDER — VANCOMYCIN HCL IN DEXTROSE 1-5 GM/200ML-% IV SOLN
1000.0000 mg | Freq: Once | INTRAVENOUS | Status: AC
  Administered 2018-03-11: 1000 mg via INTRAVENOUS
  Filled 2018-03-11 (×2): qty 200

## 2018-03-11 MED ORDER — INSULIN GLARGINE 100 UNIT/ML ~~LOC~~ SOLN
8.0000 [IU] | Freq: Every day | SUBCUTANEOUS | Status: DC
  Administered 2018-03-11 – 2018-03-12 (×2): 8 [IU] via SUBCUTANEOUS
  Filled 2018-03-11 (×3): qty 0.08

## 2018-03-11 MED ORDER — PENTAFLUOROPROP-TETRAFLUOROETH EX AERO
1.0000 "application " | INHALATION_SPRAY | CUTANEOUS | Status: DC | PRN
  Administered 2018-03-12: 1 via TOPICAL
  Filled 2018-03-11: qty 116

## 2018-03-11 MED ORDER — ISOSORBIDE MONONITRATE ER 60 MG PO TB24
60.0000 mg | ORAL_TABLET | Freq: Every day | ORAL | Status: DC
  Administered 2018-03-11: 60 mg via ORAL
  Filled 2018-03-11 (×5): qty 1

## 2018-03-11 MED ORDER — IOPAMIDOL (ISOVUE-300) INJECTION 61%
100.0000 mL | Freq: Once | INTRAVENOUS | Status: AC | PRN
  Administered 2018-03-11: 100 mL via INTRAVENOUS

## 2018-03-11 MED ORDER — SODIUM CHLORIDE 0.9 % IV SOLN: 1.0000 g | Freq: Once | INTRAVENOUS | Status: DC

## 2018-03-11 MED ORDER — PIPERACILLIN-TAZOBACTAM 3.375 G IVPB 30 MIN
3.3750 g | Freq: Once | INTRAVENOUS | Status: AC
  Administered 2018-03-11: 3.375 g via INTRAVENOUS
  Filled 2018-03-11: qty 50

## 2018-03-11 MED ORDER — INSULIN ASPART 100 UNIT/ML ~~LOC~~ SOLN: 0.0000 [IU] | Freq: Every day | SUBCUTANEOUS | Status: DC

## 2018-03-11 MED ORDER — SODIUM CHLORIDE 0.9 % IV BOLUS (SEPSIS)
500.0000 mL | Freq: Once | INTRAVENOUS | Status: AC
  Administered 2018-03-11: 500 mL via INTRAVENOUS

## 2018-03-11 MED ORDER — LIDOCAINE-PRILOCAINE 2.5-2.5 % EX CREA: 1.0000 "application " | TOPICAL_CREAM | CUTANEOUS | Status: DC | PRN

## 2018-03-11 MED ORDER — AMLODIPINE BESYLATE 5 MG PO TABS
5.0000 mg | ORAL_TABLET | Freq: Every day | ORAL | Status: DC
  Administered 2018-03-11: 5 mg via ORAL
  Filled 2018-03-11 (×3): qty 1

## 2018-03-11 MED ORDER — PIPERACILLIN-TAZOBACTAM 3.375 G IVPB
3.3750 g | Freq: Two times a day (BID) | INTRAVENOUS | Status: DC
  Administered 2018-03-11 – 2018-03-13 (×4): 3.375 g via INTRAVENOUS
  Filled 2018-03-11 (×4): qty 50

## 2018-03-11 MED ORDER — FENTANYL CITRATE (PF) 100 MCG/2ML IJ SOLN
100.0000 ug | INTRAMUSCULAR | Status: DC | PRN
  Administered 2018-03-11: 100 ug via INTRAVENOUS
  Filled 2018-03-11: qty 2

## 2018-03-11 MED ORDER — ONDANSETRON HCL 4 MG/2ML IJ SOLN
4.0000 mg | Freq: Once | INTRAMUSCULAR | Status: AC
  Administered 2018-03-11: 4 mg via INTRAVENOUS
  Filled 2018-03-11: qty 2

## 2018-03-11 MED ORDER — ACETAMINOPHEN 325 MG PO TABS: 650.0000 mg | ORAL_TABLET | Freq: Four times a day (QID) | ORAL | Status: DC | PRN

## 2018-03-11 MED ORDER — METOPROLOL TARTRATE 25 MG PO TABS
12.5000 mg | ORAL_TABLET | Freq: Two times a day (BID) | ORAL | Status: DC
  Administered 2018-03-11 – 2018-03-12 (×3): 12.5 mg via ORAL
  Filled 2018-03-11 (×5): qty 1

## 2018-03-11 MED ORDER — ENOXAPARIN SODIUM 30 MG/0.3ML ~~LOC~~ SOLN
30.0000 mg | SUBCUTANEOUS | Status: DC
  Administered 2018-03-11 – 2018-03-13 (×3): 30 mg via SUBCUTANEOUS
  Filled 2018-03-11 (×3): qty 0.3

## 2018-03-11 MED ORDER — SEVELAMER CARBONATE 800 MG PO TABS
3200.0000 mg | ORAL_TABLET | Freq: Three times a day (TID) | ORAL | Status: DC
  Administered 2018-03-11 (×2): 3200 mg via ORAL
  Filled 2018-03-11 (×9): qty 4

## 2018-03-11 MED ORDER — ASPIRIN EC 81 MG PO TBEC
81.0000 mg | DELAYED_RELEASE_TABLET | Freq: Every day | ORAL | Status: DC
  Administered 2018-03-11: 81 mg via ORAL
  Filled 2018-03-11 (×3): qty 1

## 2018-03-11 MED ORDER — INSULIN ASPART 100 UNIT/ML ~~LOC~~ SOLN
0.0000 [IU] | Freq: Three times a day (TID) | SUBCUTANEOUS | Status: DC
  Administered 2018-03-11: 1 [IU] via SUBCUTANEOUS
  Filled 2018-03-11: qty 1

## 2018-03-11 MED ORDER — CHLORHEXIDINE GLUCONATE CLOTH 2 % EX PADS
6.0000 | MEDICATED_PAD | Freq: Every day | CUTANEOUS | Status: DC
  Administered 2018-03-12 – 2018-03-13 (×2): 6 via TOPICAL

## 2018-03-11 MED ORDER — ONDANSETRON HCL 4 MG/2ML IJ SOLN
4.0000 mg | Freq: Four times a day (QID) | INTRAMUSCULAR | Status: DC | PRN
  Administered 2018-03-11 – 2018-03-12 (×2): 4 mg via INTRAVENOUS
  Filled 2018-03-11 (×2): qty 2

## 2018-03-11 MED ORDER — HYDROCODONE-ACETAMINOPHEN 5-325 MG PO TABS
1.0000 | ORAL_TABLET | Freq: Four times a day (QID) | ORAL | Status: DC | PRN
  Administered 2018-03-12: 1 via ORAL
  Filled 2018-03-11: qty 1

## 2018-03-11 MED ORDER — VANCOMYCIN HCL IN DEXTROSE 750-5 MG/150ML-% IV SOLN
750.0000 mg | INTRAVENOUS | Status: DC
  Administered 2018-03-11: 750 mg via INTRAVENOUS
  Filled 2018-03-11 (×2): qty 150

## 2018-03-11 MED ORDER — ONDANSETRON HCL 4 MG PO TABS: 4.0000 mg | ORAL_TABLET | Freq: Four times a day (QID) | ORAL | Status: DC | PRN

## 2018-03-11 NOTE — ED Notes (Signed)
ED TO INPATIENT HANDOFF REPORT  Name/Age/Gender [name] 63 y.o. female  Code Status    Code Status Orders  (From admission, onward)         Start     Ordered   03/11/18 0531  Full code  Continuous     03/11/18 0536        Code Status History    Date Active Date Inactive Code Status Order ID Comments User Context   09/21/2017 1417 09/22/2017 1609 Full Code [id number]  Murlean Iba, MD Inpatient   05/30/2017 1413 06/01/2017 1834 Full Code [id number]  Kinnie Feil, MD ED      Home/SNF/Other Home  Chief Complaint Generalized Body Weakness  Level of Care/Admitting Diagnosis ED Disposition    ED Disposition Condition [place]: [facility] [[id number]]  Level of Care: Telemetry [5]  Diagnosis: Acute pyelonephritis [209470]  Admitting Physician: Reubin Milan [[id number]]  Attending Physician: Reubin Milan [[id number]]  Estimated length of stay: past midnight tomorrow  Certification:: I certify this patient will need inpatient services for at least 2 midnights  PT Class (Do Not Modify): Inpatient [101]  PT Acc Code (Do Not Modify): Private [1]       Medical History Past Medical History:  Diagnosis Date  . Anemia    of chronic disease  . Blood transfusion without reported diagnosis   . Cardiovascular disease    nonobstructive  . Carotid artery stenosis 2008  . CHF (congestive heart failure) ([place])   . Coronary artery disease   . Diabetes mellitus   . Dyslipidemia   . Edema of lower extremity    with hypo-albuminemia and profound protenuria  . Heart murmur   . Hypertension   . Mitral regurgitation   . Nephrotic syndrome   . Patent foramen ovale   . Pulmonary hypertension, moderate to severe ([place])   . Pulmonary nodule   . Tricuspid regurgitation   . Volume depletion, renal, due to output loss (renal deficit)     Allergies Allergies  Allergen Reactions  . Other Other (See Comments)    Tape Bruises and  tears skin, Paper tape tolerated  . Tape Other (See Comments)    Bruises and tears skin Paper tape tolerated    IV Location/Drains/Wounds Patient Lines/Drains/Airways Status   Active Line/Drains/Airways    Name:   Placement date:   Placement time:   Site:   Days:   Peripheral IV 03/11/18 Right Wrist   03/11/18    0227    Wrist   less than 1   Fistula / Graft Left Forearm   -    -    Forearm      Sheath 04/05/17 Left   04/05/17    0740    -   340          Labs/Imaging Results for orders placed or performed during the hospital encounter of 03/11/18 (from the past 48 hour(s))  CBC with Differential/Platelet     Status: Abnormal   Collection Time: 03/11/18  2:40 AM  Result Value Ref Range   WBC 7.7 4.0 - 10.5 K/uL   RBC 3.60 (L) 3.87 - 5.11 MIL/uL   Hemoglobin 10.7 (L) 12.0 - 15.0 g/dL   HCT 34.3 (L) 36.0 - 46.0 %   MCV 95.3 80.0 - 100.0 fL   MCH 29.7 26.0 - 34.0 pg   MCHC 31.2 30.0 - 36.0 g/dL   RDW 12.7 11.5 - 15.5 %  Platelets 131 (L) 150 - 400 K/uL   nRBC 0.0 0.0 - 0.2 %   Neutrophils Relative % 78 %   Neutro Abs 6.1 1.7 - 7.7 K/uL   Lymphocytes Relative 6 %   Lymphs Abs 0.5 (L) 0.7 - 4.0 K/uL   Monocytes Relative 8 %   Monocytes Absolute 0.6 0.1 - 1.0 K/uL   Eosinophils Relative 6 %   Eosinophils Absolute 0.4 0.0 - 0.5 K/uL   Basophils Relative 1 %   Basophils Absolute 0.0 0.0 - 0.1 K/uL   Immature Granulocytes 1 %   Abs Immature Granulocytes 0.04 0.00 - 0.07 K/uL    Comment: Performed at [facility], [address], [place], [place] [address]  Comprehensive metabolic panel     Status: Abnormal   Collection Time: 03/11/18  2:40 AM  Result Value Ref Range   Sodium 138 135 - 145 mmol/L   Potassium 4.5 3.5 - 5.1 mmol/L   Chloride 103 98 - 111 mmol/L   CO2 24 22 - 32 mmol/L   Glucose, Bld 270 (H) 70 - 99 mg/dL   BUN 42 (H) 8 - 23 mg/dL   Creatinine, Ser 9.30 (H) 0.44 - 1.00 mg/dL   Calcium 8.5 (L) 8.9 - 10.3 mg/dL   Total Protein 7.0 6.5 - 8.1 g/dL   Albumin  3.3 (L) 3.5 - 5.0 g/dL   AST 80 (H) 15 - 41 U/L   ALT 48 (H) 0 - 44 U/L   Alkaline Phosphatase 118 38 - 126 U/L   Total Bilirubin 0.5 0.3 - 1.2 mg/dL   GFR calc non Af Amer 4 (L) >60 mL/min   GFR calc Af Amer 5 (L) >60 mL/min   Anion gap 11 5 - 15    Comment: Performed at [facility], [address] [place], [place] [address]  Lipase, blood     Status: Abnormal   Collection Time: 03/11/18  2:40 AM  Result Value Ref Range   Lipase 54 (H) 11 - 51 U/L    Comment: Performed at [facility], [name] [place], [place] [address]  Hemoglobin A1c     Status: Abnormal   Collection Time: 03/11/18  2:40 AM  Result Value Ref Range   Hgb A1c MFr Bld 8.1 (H) 4.8 - 5.6 %    Comment: (NOTE) Pre diabetes:          5.7%-6.4% Diabetes:              >6.4% Glycemic control for   <7.0% adults with diabetes    Mean Plasma Glucose 185.77 mg/dL    Comment: Performed at [facility], [place] [address] [place], [place] [address]  Blood culture (routine x 2)     Status: None (Preliminary result)   Collection Time: 03/11/18  5:21 AM  Result Value Ref Range   Specimen Description BLOOD RIGHT ARM    Special Requests      BOTTLES DRAWN AEROBIC AND ANAEROBIC Blood Culture adequate volume   Culture      NO GROWTH < 12 HOURS Performed at [facility], [address], [place], [place] [address]    Report Status PENDING   Blood culture (routine x 2)     Status: None (Preliminary result)   Collection Time: 03/11/18  5:28 AM  Result Value Ref Range   Specimen Description BLOOD RIGHT HAND    Special Requests      BOTTLES DRAWN AEROBIC AND ANAEROBIC Blood Culture adequate volume  Culture      NO GROWTH <12 HOURS Performed at [facility], [address] [place], [place] [address]    Report Status PENDING   I-Stat CG4 Lactic Acid, ED     Status: None   Collection Time: 03/11/18  5:33 AM  Result Value Ref Range   Lactic Acid, Venous 1.60 0.5 - 1.9 mmol/L  Urinalysis, Routine w reflex microscopic      Status: Abnormal   Collection Time: 03/11/18  8:44 AM  Result Value Ref Range   Color, Urine YELLOW YELLOW   APPearance HAZY (A) CLEAR   Specific Gravity, Urine 1.016 1.005 - 1.030   pH 6.0 5.0 - 8.0   Glucose, UA 150 (A) NEGATIVE mg/dL   Hgb urine dipstick MODERATE (A) NEGATIVE   Bilirubin Urine NEGATIVE NEGATIVE   Ketones, ur NEGATIVE NEGATIVE mg/dL   Protein, ur 100 (A) NEGATIVE mg/dL   Nitrite NEGATIVE NEGATIVE   Leukocytes, UA MODERATE (A) NEGATIVE   RBC / HPF 21-50 0 - 5 RBC/hpf   WBC, UA 21-50 0 - 5 WBC/hpf   Bacteria, UA RARE (A) NONE SEEN   Squamous Epithelial / LPF 0-5 0 - 5   Hyaline Casts, UA PRESENT    Granular Casts, UA PRESENT    Non Squamous Epithelial 0-5 (A) NONE SEEN    Comment: Performed at [facility], [address] [place],  [address]  CBG monitoring, ED     Status: Abnormal   Collection Time: 03/11/18 12:37 PM  Result Value Ref Range   Glucose-Capillary 130 (H) 70 - 99 mg/dL   Ct Abdomen Pelvis W Contrast  Result Date: 03/11/2018 CLINICAL DATA:  Right-sided abdominal pain with vomiting for 10 days EXAM: CT ABDOMEN AND PELVIS WITH CONTRAST TECHNIQUE: Multidetector CT imaging of the abdomen and pelvis was performed using the standard protocol following bolus administration of intravenous contrast. CONTRAST:  198mL ISOVUE-300 IOPAMIDOL (ISOVUE-300) INJECTION 61% COMPARISON:  05/30/2017 FINDINGS: Lower chest: Diffuse coronary atherosclerotic calcification. No acute finding Hepatobiliary: No focal liver abnormality.Cholecystectomy. Normal common bile duct diameter. Pancreas: Unremarkable. Spleen: Unremarkable. Adrenals/Urinary Tract: Negative adrenals. End-stage renal disease with atrophic native kidneys that are nonenhancing. A transplant kidney in the right lower quadrant is normal size and enhancing but reportedly nonfunctional. There is transplant moderate hydronephrosis that is new from prior. The urothelium at the level of the ureter appears thickened,  although there is no notable straining at the renal pelvis. Mild perinephric stranding that is stable. No abscess is seen. Small volume urinary bladder. Stomach/Bowel: Formed stool throughout the descending colon, sigmoid, and rectum. No bowel obstruction. No appendicitis. Vascular/Lymphatic: Diffuse atherosclerotic calcification without branch occlusion or other acute finding. No mass or adenopathy. Reproductive:No pathologic findings. Other: No pneumoperitoneum.  Trace pelvic ascites Musculoskeletal: No acute abnormalities. Remote T11 inferior endplate fracture. L5-S1 degenerative disc narrowing. IMPRESSION: 1. New hydronephrosis of the right lower quadrant transplant kidney with mild urothelial thickening of the transplant ureter, possible infection in this setting. 2. Formed stool throughout the left colon, please correlate for constipation. Electronically Signed   By: Monte Fantasia M.D.   On: 03/11/2018 04:26    Pending Labs Unresulted Labs (From admission, onward)    Start     Ordered   03/18/18 0500  Creatinine, serum  (enoxaparin (LOVENOX)    CrCl < 30 ml/min)  Weekly,   R    Comments:  while on enoxaparin therapy.    03/11/18 0536   03/12/18 0500  CBC WITH DIFFERENTIAL  Daily,  R     03/11/18 0536   03/12/18 0500  Comprehensive metabolic panel  Daily,   R     03/11/18 0622   03/11/18 0833  Urinalysis, Routine w reflex microscopic  ONCE - STAT,   R     03/11/18 0833   03/11/18 0833  Urine Culture  ONCE - STAT,   R     03/11/18 2119   03/11/18 0437  Urine culture  ONCE - STAT,   STAT     03/11/18 0436   Signed and Held  Renal function panel  Once,   R     Signed and Held   Signed and Held  CBC  Once,   R     Signed and Held          Vitals/Pain Today's Vitals   03/11/18 1200 03/11/18 1245 03/11/18 1328 03/11/18 1330  BP: (!) 155/54 (!) 144/42 (!) 144/58 (!) 116/53  Pulse: (!) 57 (!) 53 (!) 51 88  Resp:   15   Temp:      TempSrc:      SpO2: 100% 100% 100% 100%  Weight:       Height:      PainSc:        Isolation Precautions No active isolations  Medications Medications  enoxaparin (LOVENOX) injection 30 mg (30 mg Subcutaneous Given 03/11/18 0630)  ondansetron (ZOFRAN) tablet 4 mg (has no administration in time range)    Or  ondansetron (ZOFRAN) injection 4 mg (has no administration in time range)  acetaminophen (TYLENOL) tablet 650 mg (has no administration in time range)    Or  acetaminophen (TYLENOL) suppository 650 mg (has no administration in time range)  fentaNYL (SUBLIMAZE) injection 100 mcg (100 mcg Intravenous Given 03/11/18 0630)  amLODipine (NORVASC) tablet 5 mg (5 mg Oral Given 03/11/18 1118)  aspirin EC tablet 81 mg (81 mg Oral Given 03/11/18 1118)  HYDROcodone-acetaminophen (NORCO/VICODIN) 5-325 MG per tablet 1 tablet (has no administration in time range)  insulin glargine (LANTUS) injection 8 Units (has no administration in time range)  isosorbide mononitrate (IMDUR) 24 hr tablet 60 mg (60 mg Oral Given 03/11/18 1246)  metoprolol tartrate (LOPRESSOR) tablet 12.5 mg (12.5 mg Oral Given 03/11/18 1118)  predniSONE (DELTASONE) tablet 5 mg (5 mg Oral Not Given 03/11/18 0828)  sevelamer carbonate (RENVELA) tablet 3,200 mg (3,200 mg Oral Given 03/11/18 1246)  insulin aspart (novoLOG) injection 0-5 Units (has no administration in time range)  insulin aspart (novoLOG) injection 0-9 Units (1 Units Subcutaneous Given 03/11/18 1247)  piperacillin-tazobactam (ZOSYN) IVPB 3.375 g (has no administration in time range)  vancomycin (VANCOCIN) IVPB 750 mg/150 ml premix (has no administration in time range)  Chlorhexidine Gluconate Cloth 2 % PADS 6 each (has no administration in time range)  pentafluoroprop-tetrafluoroeth (GEBAUERS) aerosol 1 application (has no administration in time range)  lidocaine (PF) (XYLOCAINE) 1 % injection 5 mL (has no administration in time range)  lidocaine-prilocaine (EMLA) cream 1 application (has no administration in time range)  0.9 %   sodium chloride infusion (has no administration in time range)  0.9 %  sodium chloride infusion (has no administration in time range)  acetaminophen (TYLENOL) tablet 650 mg (650 mg Oral Given 03/11/18 0252)  ondansetron (ZOFRAN) injection 4 mg (4 mg Intravenous Given 03/11/18 0252)  fentaNYL (SUBLIMAZE) injection 100 mcg (100 mcg Intravenous Given 03/11/18 0252)  iopamidol (ISOVUE-300) 61 % injection 100 mL (100 mLs Intravenous Contrast Given 03/11/18 0344)  sodium chloride 0.9 % bolus  500 mL (0 mLs Intravenous Stopped 03/11/18 0613)  vancomycin (VANCOCIN) IVPB 1000 mg/200 mL premix (0 mg Intravenous Stopped 03/11/18 0845)  piperacillin-tazobactam (ZOSYN) IVPB 3.375 g (0 g Intravenous Stopped 03/11/18 0613)    Mobility walks

## 2018-03-11 NOTE — Plan of Care (Signed)

## 2018-03-11 NOTE — ED Triage Notes (Signed)
Pt c/o right side abdominal pain with vomiting x 10 days; pt states she has not had a BM in 10 days; pt c/o of mostly right side abdominal pain; pt is a dialysis pt and had it last on Friday

## 2018-03-11 NOTE — ED Provider Notes (Addendum)
[facility] EMERGENCY DEPARTMENT Provider Note   CSN: [id number] Arrival date & time: 03/11/18  0120     History   Chief Complaint Chief Complaint  Patient presents with  . Abdominal Pain    HPI [name] is a 63 y.o. female.  The history is provided by the patient.  Abdominal Pain   This is a new problem. The current episode started more than 1 week ago. The problem occurs daily. The problem has been gradually worsening. The pain is located in the RLQ. The pain is moderate. Associated symptoms include fever, nausea, vomiting, constipation and headaches. Pertinent negatives include diarrhea and dysuria. The symptoms are aggravated by palpation. Nothing relieves the symptoms.   Patient with history of anemia, CAD, diabetes, ESRD on dialysis, after failed transplant She presents with abdominal pain for over a week.  She reports associated nonbloody nausea/vomiting.  She reports that is been at least a week since her last bowel movement, but she is passing flatus.  She reports fevers and chills. She reports mild headache. She denies cough/sore throat. She was as seen at [facility] around December 29 and was told that she needed dialysis She receives dialysis on M/W/F Past Medical History:  Diagnosis Date  . Anemia    of chronic disease  . Blood transfusion without reported diagnosis   . Cardiovascular disease    nonobstructive  . Carotid artery stenosis 2008  . CHF (congestive heart failure) ([place])   . Coronary artery disease   . Diabetes mellitus   . Dyslipidemia   . Edema of lower extremity    with hypo-albuminemia and profound protenuria  . Heart murmur   . Hypertension   . Mitral regurgitation   . Nephrotic syndrome   . Patent foramen ovale   . Pulmonary hypertension, moderate to severe ([place])   . Pulmonary nodule   . Tricuspid regurgitation   . Volume depletion, renal, due to output loss (renal deficit)     Patient Active Problem List   Diagnosis Date  Noted  . Chronic kidney disease on chronic dialysis ([place]) 11/13/2017  . Nausea 09/21/2017  . Leukocytosis 09/21/2017  . Generalized weakness 09/21/2017  . Nausea & vomiting 05/30/2017  . Hyperlipidemia associated with type 2 diabetes mellitus ([place]) 02/22/2017  . CKD (chronic kidney disease) requiring chronic dialysis ([place]) 09/02/16  . 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 Encounter for aftercare following kidney transplant 05/22/2016  . Chronic diastolic heart failure ([place]) 06/08/2015  . Aortic valve sclerosis 06/08/2015  . Anemia of chronic disease 07/03/2013  . Hypertension associated with diabetes ([place]) 12/18/2010  . Diabetes mellitus ([place]) 12/18/2010    Past Surgical History:  Procedure Laterality Date  . A/V FISTULAGRAM N/A 04/05/2017   Procedure: A/V FISTULAGRAM - Left Arm;  Surgeon: Angelia Mould, MD;  Location: [place] [facility];  Service: Cardiovascular;  Laterality: N/A;  . A/V FISTULAGRAM Left 09/18/2017   Procedure: A/V FISTULAGRAM;  Surgeon: Serafina Mitchell, MD;  Location: [place] [facility];  Service: Cardiovascular;  Laterality: Left;  . A/V SHUNT INTERVENTION Left 04/05/2017   Procedure: A/V SHUNT INTERVENTION;  Surgeon: Angelia Mould, MD;  Location: [place] [facility];  Service: Cardiovascular;  Laterality: Left;  . CARDIAC CATHETERIZATION  2008  . IR REMOVAL TUN CV CATH W/O FL  11/02/2016  . 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 PERIPHERAL VASCULAR BALLOON ANGIOPLASTY Left 09/18/2017   Procedure: PERIPHERAL VASCULAR BALLOON ANGIOPLASTY;  Surgeon: Serafina Mitchell, MD;  Location:  [place] [facility];  Service: Cardiovascular;  Laterality: Left;  Arm Fistula     OB History   No obstetric history on file.      Home Medications    Prior to Admission medications   Medication Sig Start Date End Date Taking? Authorizing Provider  acetaminophen (TYLENOL) 500 MG tablet Take 500 mg by mouth every 6 (six) hours as needed for moderate  pain or headache.  04/06/16   [provider]  amLODipine (NORVASC) 5 MG tablet TAKE 5 MG BY MOUTH NIGHTLY 10/24/17   Eustaquio Maize, MD  aspirin (ASPIR-LOW) 81 MG EC tablet Take 81 mg by mouth daily.     [provider]  Dulaglutide (TRULICITY) 1.5 QI/6.9GE SOPN INJECT 1.5 MG INTO THE SKIN ONCE A WEEK 09/25/17   Hawks, Alyse Low A, FNP  HYDROcodone-acetaminophen (NORCO/VICODIN) 5-325 MG tablet Take 1 tablet by mouth every 6 (six) hours as needed for moderate pain. 11/26/17   Milton Ferguson, MD  insulin degludec (TRESIBA FLEXTOUCH) 100 UNIT/ML SOPN FlexTouch Pen Inject 0.08 mLs (8 Units total) into the skin daily at 10 pm. 09/25/17   Sharion Balloon, FNP  isosorbide mononitrate (IMDUR) 60 MG 24 hr tablet TAKE 1 TABLET BY MOUTH ONCE DAILY 10/29/17   Evelina Dun A, FNP  lidocaine-prilocaine (EMLA) cream Apply 1 application topically as needed (for dialysis).  09/08/16   [provider]  meclizine (ANTIVERT) 25 MG tablet Take 1 tablet (25 mg total) by mouth 2 (two) times daily as needed for dizziness. 11/13/17   Evelina Dun A, FNP  metoprolol tartrate (LOPRESSOR) 25 MG tablet Take 0.5 tablets (12.5 mg total) by mouth 2 (two) times daily. 09/25/17   Sharion Balloon, FNP  ondansetron (ZOFRAN ODT) 4 MG disintegrating tablet 4mg  ODT q4 hours prn nausea/vomit 11/26/17   Milton Ferguson, MD  predniSONE (DELTASONE) 5 MG tablet TAKE 1 TABLET BY MOUTH ONCE DAILY WITH BREAKFAST 12/31/17   Hawks, Christy A, FNP  PROGRAF 0.5 MG capsule Take 0.5-1 mg by mouth See admin instructions. Take 1 mg by mouth in the morning and take 0.5 mg by mouth at bedtime 10/24/16   [provider]  rosuvastatin (CRESTOR) 20 MG tablet Take 1 tablet (20 mg total) by mouth daily. 09/25/17   Sharion Balloon, FNP  sevelamer carbonate (RENVELA) 800 MG tablet Take 3200 mg by mouth 3 times daily with meals and take 1600 mg by mouth with snacks 10/02/16   [provider]    Family History Family History    Problem Relation Age of Onset  . Kidney disease Mother   . Diabetes Mother   . Diabetes Father   . Heart disease Father     Social History Social History   Tobacco Use  . Smoking status: Never Smoker  . Smokeless tobacco: Never Used  Substance Use Topics  . Alcohol use: No  . Drug use: No     Allergies   Other and Tape   Review of Systems Review of Systems  Constitutional: Positive for chills, fatigue and fever.  HENT: Negative for sore throat.   Respiratory: Negative for cough and shortness of breath.   Gastrointestinal: Positive for abdominal pain, constipation, nausea and vomiting. Negative for diarrhea.  Genitourinary: Negative for dysuria.  Musculoskeletal: Negative for neck stiffness.  Skin: Negative for rash.  Neurological: Positive for headaches.  All other systems reviewed and are negative.    Physical Exam Updated Vital Signs BP (!) 155/52 (BP Location: Right Arm)  Pulse 78   Temp (!) 102.4 F (39.1 C) (Oral)   Resp 20   Ht 1.753 m (5\' 9" )   Wt 63.5 kg   SpO2 99%   BMI 20.67 kg/m   Physical Exam CONSTITUTIONAL: Chronically ill-appearing, no acute distress HEAD: Normocephalic/atraumatic EYES: EOMI ENMT: Mucous membranes dry NECK: supple no meningeal signs SPINE/BACK:entire spine nontender CV: S1/S2 noted, murmur noted LUNGS: Lungs are clear to auscultation bilaterally, no apparent distress ABDOMEN: soft, moderate right lower quadrant tenderness, no rebound or guarding, bowel sounds noted throughout abdomen GU:no cva tenderness NEURO: Pt is awake/alert/appropriate, moves all extremitiesx4   EXTREMITIES: pulses normal/equal, full ROM, HD access to left arm thrill noted SKIN: warm, color normal PSYCH: no abnormalities of mood noted, alert and oriented to situation   ED Treatments / Results  Labs (all labs ordered are listed, but only abnormal results are displayed) Labs Reviewed  CBC WITH DIFFERENTIAL/PLATELET - Abnormal; Notable for the  following components:      Result Value   RBC 3.60 (*)    Hemoglobin 10.7 (*)    HCT 34.3 (*)    Platelets 131 (*)    Lymphs Abs 0.5 (*)    All other components within normal limits  COMPREHENSIVE METABOLIC PANEL - Abnormal; Notable for the following components:   Glucose, Bld 270 (*)    BUN 42 (*)    Creatinine, Ser 9.30 (*)    Calcium 8.5 (*)    Albumin 3.3 (*)    AST 80 (*)    ALT 48 (*)    GFR calc non Af Amer 4 (*)    GFR calc Af Amer 5 (*)    All other components within normal limits  LIPASE, BLOOD - Abnormal; Notable for the following components:   Lipase 54 (*)    All other components within normal limits  CULTURE, BLOOD (ROUTINE X 2)  CULTURE, BLOOD (ROUTINE X 2)  URINE CULTURE  URINALYSIS, ROUTINE W REFLEX MICROSCOPIC  I-STAT CG4 LACTIC ACID, ED    EKG None  Radiology Ct Abdomen Pelvis W Contrast  Result Date: 03/11/2018 CLINICAL DATA:  Right-sided abdominal pain with vomiting for 10 days EXAM: CT ABDOMEN AND PELVIS WITH CONTRAST TECHNIQUE: Multidetector CT imaging of the abdomen and pelvis was performed using the standard protocol following bolus administration of intravenous contrast. CONTRAST:  130mL ISOVUE-300 IOPAMIDOL (ISOVUE-300) INJECTION 61% COMPARISON:  05/30/2017 FINDINGS: Lower chest: Diffuse coronary atherosclerotic calcification. No acute finding Hepatobiliary: No focal liver abnormality.Cholecystectomy. Normal common bile duct diameter. Pancreas: Unremarkable. Spleen: Unremarkable. Adrenals/Urinary Tract: Negative adrenals. End-stage renal disease with atrophic native kidneys that are nonenhancing. A transplant kidney in the right lower quadrant is normal size and enhancing but reportedly nonfunctional. There is transplant moderate hydronephrosis that is new from prior. The urothelium at the level of the ureter appears thickened, although there is no notable straining at the renal pelvis. Mild perinephric stranding that is stable. No abscess is seen. Small  volume urinary bladder. Stomach/Bowel: Formed stool throughout the descending colon, sigmoid, and rectum. No bowel obstruction. No appendicitis. Vascular/Lymphatic: Diffuse atherosclerotic calcification without branch occlusion or other acute finding. No mass or adenopathy. Reproductive:No pathologic findings. Other: No pneumoperitoneum.  Trace pelvic ascites Musculoskeletal: No acute abnormalities. Remote T11 inferior endplate fracture. L5-S1 degenerative disc narrowing. IMPRESSION: 1. New hydronephrosis of the right lower quadrant transplant kidney with mild urothelial thickening of the transplant ureter, possible infection in this setting. 2. Formed stool throughout the left colon, please correlate for constipation. Electronically Signed  By: Monte Fantasia M.D.   On: 03/11/2018 04:26    Procedures Procedures  CRITICAL CARE Performed by: Sharyon Cable Total critical care time: 35 minutes Critical care time was exclusive of separately billable procedures and treating other patients. Critical care was necessary to treat or prevent imminent or life-threatening deterioration. Critical care was time spent personally by me on the following activities: development of treatment plan with patient and/or surrogate as well as nursing, discussions with consultants, evaluation of patient's response to treatment, examination of patient, obtaining history from patient or surrogate, ordering and performing treatments and interventions, ordering and review of laboratory studies, ordering and review of radiographic studies, pulse oximetry and re-evaluation of patient's condition.   Medications Ordered in ED Medications  vancomycin (VANCOCIN) IVPB 1000 mg/200 mL premix (has no administration in time range)  piperacillin-tazobactam (ZOSYN) IVPB 3.375 g (has no administration in time range)  enoxaparin (LOVENOX) injection 30 mg (has no administration in time range)  ondansetron (ZOFRAN) tablet 4 mg (has no  administration in time range)    Or  ondansetron (ZOFRAN) injection 4 mg (has no administration in time range)  acetaminophen (TYLENOL) tablet 650 mg (has no administration in time range)    Or  acetaminophen (TYLENOL) suppository 650 mg (has no administration in time range)  acetaminophen (TYLENOL) tablet 650 mg (650 mg Oral Given 03/11/18 0252)  ondansetron (ZOFRAN) injection 4 mg (4 mg Intravenous Given 03/11/18 0252)  fentaNYL (SUBLIMAZE) injection 100 mcg (100 mcg Intravenous Given 03/11/18 0252)  iopamidol (ISOVUE-300) 61 % injection 100 mL (100 mLs Intravenous Contrast Given 03/11/18 0344)  sodium chloride 0.9 % bolus 500 mL (500 mLs Intravenous New Bag/Given 03/11/18 0504)     Initial Impression / Assessment and Plan / ED Course  I have reviewed the triage vital signs and the nursing notes.  Pertinent labs & imaging results that were available during my care of the patient were reviewed by me and considered in my medical decision making (see chart for details).     3:06 AM Patient with fever but otherwise vitals are appropriate.  She is not septic appearing.  She has had fever and vomiting with abdominal pain.  She will require CT imaging.  No other source of fever is identified at this time.  She has no meningeal signs, no rash or skin infections.  No respiratory symptoms. 4:59 AM CT imaging reveals potential pyelonephritis in transplanted kidney.  She currently has no urine in her bladder to test.  Blood cultures and lactate have been sent.  She will also require IV antibiotics.  I will contact the transplant team at [facility] Patient has history of kidney transplantation that was deemed failed in May 2018 and has been on dialysis since that time 5:41 AM CT findings discussed with transplant team Dr. Linward Foster at [facility].  He request starting vancomycin and Zosyn.  He recommends renal ultrasound.  We agreed the patient can be managed any pain.  Discussed with Dr. Olevia Bowens for  admission.  Patient and husband updated on plan Final Clinical Impressions(s) / ED Diagnoses   Final diagnoses:  Pyelonephritis  ESRD (end stage renal disease) [facility])    ED Discharge Orders    None       Ripley Fraise, MD 03/11/18 0507    Ripley Fraise, MD 03/11/18 [phone]

## 2018-03-11 NOTE — H&P (Signed)
History and Physical    [name] DOB: 1955-09-14 DOA: 03/11/2018  PCP: Sharion Balloon, FNP   Patient coming from: Home.  I have personally briefly reviewed patient's old medical records in [place]  Chief Complaint: Fever.  HPI: [name] is a 63 y.o. female with medical history significant of anemia renal disease, nonobstructive CAD, carotid artery disease, hypertension, pulmonary hypertension, mitral regurgitation, tricuspid regurgitation, patent foramen ovale, hyperlipidemia, type 2 diabetes who is coming to the emergency department with complaints of fever, associated with progressively worse right flank and RUQ tenderness, fatigue, chills and night sweats.  She denies dysuria, frequency or hematuria.  However she urinates less than usual due to ESRD.  She denies headache, sore throat, rhinorrhea, dyspnea, wheezing, hemoptysis, chest pain, palpitations, diaphoresis, PND, orthopnea or pitting edema of the lower extremities.  She has decreased appetite, frequent nausea and constipation, but denies emesis, diarrhea, melena or hematochezia.  She denies polyuria, polydipsia, polyphagia or blurred vision.  No heat or cold intolerance.  She denies skin rashes or pruritus.  ED Course: Initial vital signs were temperature 101.4 F, pulse 78, respirations 20, blood pressure 155/52 mmHg and O2 sat 99% on room air.  The patient received 650 mg of Tylenol p.o. x1, fentanyl 100 mcg IVP x1, Zofran 4 mg IVP x1, vancomycin and Zosyn.  Her white count was 7.7, hemoglobin 10.7 g/dL and platelets 131.  CMP shows normal electrolytes when calcium is corrected to an albumin of 3.3 g/dL.  BUN is 42, creatinine 9.3 and glucose 270 mg/dL.  AST was 80 and ALT was 48 units/L.  Total protein, alkaline phosphatase and total bilirubin are within normal limits.  Lactic acid is 1.60 mmol/L.  Imaging: New hydronephrosis of the right lower quadrant transplant kidney with mild urothelial  thickening of the transplant ureter, which may be the cause of infection in this setting.  There is formed stool throughout the colon.  Please see images and full radiology report for further detail.  Review of Systems: As per HPI otherwise 10 point review of systems negative.   Past Medical History:  Diagnosis Date  . Anemia    of chronic disease  . Blood transfusion without reported diagnosis   . Cardiovascular disease    nonobstructive  . Carotid artery stenosis 2008  . CHF (congestive heart failure) ([place])   . Coronary artery disease   . Diabetes mellitus   . Dyslipidemia   . Edema of lower extremity    with hypo-albuminemia and profound protenuria  . Heart murmur   . Hypertension   . Mitral regurgitation   . Nephrotic syndrome   . Patent foramen ovale   . Pulmonary hypertension, moderate to severe ([place])   . Pulmonary nodule   . Tricuspid regurgitation   . Volume depletion, renal, due to output loss (renal deficit)     Past Surgical History:  Procedure Laterality Date  . A/V FISTULAGRAM N/A 04/05/2017   Procedure: A/V FISTULAGRAM - Left Arm;  Surgeon: Angelia Mould, MD;  Location: [place] [facility];  Service: Cardiovascular;  Laterality: N/A;  . A/V FISTULAGRAM Left 09/18/2017   Procedure: A/V FISTULAGRAM;  Surgeon: Serafina Mitchell, MD;  Location: [place] [facility];  Service: Cardiovascular;  Laterality: Left;  . A/V SHUNT INTERVENTION Left 04/05/2017   Procedure: A/V SHUNT INTERVENTION;  Surgeon: Angelia Mould, MD;  Location: [place] [facility];  Service: Cardiovascular;  Laterality: Left;  . CARDIAC CATHETERIZATION  2008  .  IR REMOVAL TUN CV CATH W/O FL  11/02/2016  . 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 PERIPHERAL VASCULAR BALLOON ANGIOPLASTY Left 09/18/2017   Procedure: PERIPHERAL VASCULAR BALLOON ANGIOPLASTY;  Surgeon: Serafina Mitchell, MD;  Location: [place] [facility];  Service: Cardiovascular;  Laterality: Left;  Arm Fistula     reports that she  has never smoked. She has never used smokeless tobacco. She reports that she does not drink alcohol or use drugs.  Allergies  Allergen Reactions  . Other Other (See Comments)    Tape Bruises and tears skin, Paper tape tolerated  . Tape Other (See Comments)    Bruises and tears skin Paper tape tolerated    Family History  Problem Relation Age of Onset  . Kidney disease Mother   . Diabetes Mother   . Diabetes Father   . Heart disease Father    Prior to Admission medications   Medication Sig Start Date End Date Taking? Authorizing Provider  acetaminophen (TYLENOL) 500 MG tablet Take 500 mg by mouth every 6 (six) hours as needed for moderate pain or headache.  04/06/16   [provider]  amLODipine (NORVASC) 5 MG tablet TAKE 5 MG BY MOUTH NIGHTLY 10/24/17   Eustaquio Maize, MD  aspirin (ASPIR-LOW) 81 MG EC tablet Take 81 mg by mouth daily.     [provider]  Dulaglutide (TRULICITY) 1.5 FV/4.9SW SOPN INJECT 1.5 MG INTO THE SKIN ONCE A WEEK 09/25/17   Hawks, Alyse Low A, FNP  HYDROcodone-acetaminophen (NORCO/VICODIN) 5-325 MG tablet Take 1 tablet by mouth every 6 (six) hours as needed for moderate pain. 11/26/17   Milton Ferguson, MD  insulin degludec (TRESIBA FLEXTOUCH) 100 UNIT/ML SOPN FlexTouch Pen Inject 0.08 mLs (8 Units total) into the skin daily at 10 pm. 09/25/17   Sharion Balloon, FNP  isosorbide mononitrate (IMDUR) 60 MG 24 hr tablet TAKE 1 TABLET BY MOUTH ONCE DAILY 10/29/17   Evelina Dun A, FNP  lidocaine-prilocaine (EMLA) cream Apply 1 application topically as needed (for dialysis).  09/08/16   [provider]  meclizine (ANTIVERT) 25 MG tablet Take 1 tablet (25 mg total) by mouth 2 (two) times daily as needed for dizziness. 11/13/17   Evelina Dun A, FNP  metoprolol tartrate (LOPRESSOR) 25 MG tablet Take 0.5 tablets (12.5 mg total) by mouth 2 (two) times daily. 09/25/17   Sharion Balloon, FNP  ondansetron (ZOFRAN ODT) 4 MG disintegrating tablet 4mg  ODT q4  hours prn nausea/vomit 11/26/17   Milton Ferguson, MD  predniSONE (DELTASONE) 5 MG tablet TAKE 1 TABLET BY MOUTH ONCE DAILY WITH BREAKFAST 12/31/17   Hawks, Christy A, FNP  PROGRAF 0.5 MG capsule Take 0.5-1 mg by mouth See admin instructions. Take 1 mg by mouth in the morning and take 0.5 mg by mouth at bedtime 10/24/16   [provider]  rosuvastatin (CRESTOR) 20 MG tablet Take 1 tablet (20 mg total) by mouth daily. 09/25/17   Sharion Balloon, FNP  sevelamer carbonate (RENVELA) 800 MG tablet Take 3200 mg by mouth 3 times daily with meals and take 1600 mg by mouth with snacks 10/02/16   [provider]    Physical Exam: Vitals:   03/11/18 0330 03/11/18 0400 03/11/18 0430 03/11/18 0504  BP: (!) 134/51 (!) 135/46 (!) 127/28 (!) 132/47  Pulse: 79 71 69 71  Resp: 16 16 16 16   Temp:      TempSrc:      SpO2: 100% 100% 100% 99%  Weight:  Height:        Constitutional: Looks chronically ill, but in NAD, calm, comfortable Eyes: PERRL, lids and conjunctivae normal ENMT: Mucous membranes are moist. Posterior pharynx clear of any exudate or lesions. Neck: normal, supple, no masses, no thyromegaly Respiratory: clear to auscultation bilaterally, no wheezing, no crackles. Normal respiratory effort. No accessory muscle use.  Cardiovascular: Regular rate and rhythm, no murmurs / rubs / gallops. No extremity edema. 2+ pedal pulses. No carotid bruits.  Positive left upper extremity AV graft.  Positive nonfunctional RUE AV graft. Abdomen: Surgical scars noted.  Soft, RUQ and right flank tenderness, no guarding or rebound, no masses palpated. No hepatosplenomegaly. Bowel sounds positive.  Musculoskeletal: no clubbing / cyanosis.  Good ROM, no contractures. Normal muscle tone.  Skin: Multiple areas of ecchymosis and abrasions on extremities, particularly upper extremities. Neurologic: CN 2-12 grossly intact. Sensation intact, DTR normal. Strength 5/5 in all 4.  Psychiatric: Normal judgment  and insight. Alert and oriented x 3. Normal mood.   Labs on Admission: I have personally reviewed following labs and imaging studies  CBC: Recent Labs  Lab 03/11/18 0240  WBC 7.7  NEUTROABS 6.1  HGB 10.7*  HCT 34.3*  MCV 95.3  PLT 662*   Basic Metabolic Panel: Recent Labs  Lab 03/11/18 0240  NA 138  K 4.5  CL 103  CO2 24  GLUCOSE 270*  BUN 42*  CREATININE 9.30*  CALCIUM 8.5*   GFR: Estimated Creatinine Clearance: 6.2 mL/min (A) (by C-G formula based on SCr of 9.3 mg/dL (H)). Liver Function Tests: Recent Labs  Lab 03/11/18 0240  AST 80*  ALT 48*  ALKPHOS 118  BILITOT 0.5  PROT 7.0  ALBUMIN 3.3*   Recent Labs  Lab 03/11/18 0240  LIPASE 54*   No results for input(s): AMMONIA in the last 168 hours. Coagulation Profile: No results for input(s): INR, PROTIME in the last 168 hours. Cardiac Enzymes: No results for input(s): CKTOTAL, CKMB, CKMBINDEX, TROPONINI in the last 168 hours. BNP (last 3 results) No results for input(s): PROBNP in the last 8760 hours. HbA1C: No results for input(s): HGBA1C in the last 72 hours. CBG: No results for input(s): GLUCAP in the last 168 hours. Lipid Profile: No results for input(s): CHOL, HDL, LDLCALC, TRIG, CHOLHDL, LDLDIRECT in the last 72 hours. Thyroid Function Tests: No results for input(s): TSH, T4TOTAL, FREET4, T3FREE, THYROIDAB in the last 72 hours. Anemia Panel: No results for input(s): VITAMINB12, FOLATE, FERRITIN, TIBC, IRON, RETICCTPCT in the last 72 hours. Urine analysis:    Component Value Date/Time   COLORURINE AMBER (A) 06/04/2017 1750   APPEARANCEUR CLOUDY (A) 06/04/2017 1750   APPEARANCEUR Clear 06/04/2017 1436   LABSPEC 1.024 06/04/2017 1750   PHURINE 5.0 06/04/2017 1750   GLUCOSEU NEGATIVE 06/04/2017 1750   HGBUR NEGATIVE 06/04/2017 1750   BILIRUBINUR SMALL (A) 06/04/2017 1750   BILIRUBINUR Negative 06/04/2017 1436   KETONESUR 5 (A) 06/04/2017 1750   PROTEINUR >=300 (A) 06/04/2017 1750   NITRITE  NEGATIVE 06/04/2017 1750   LEUKOCYTESUR TRACE (A) 06/04/2017 1750   LEUKOCYTESUR Negative 06/04/2017 1436    Radiological Exams on Admission: Ct Abdomen Pelvis W Contrast  Result Date: 03/11/2018 CLINICAL DATA:  Right-sided abdominal pain with vomiting for 10 days EXAM: CT ABDOMEN AND PELVIS WITH CONTRAST TECHNIQUE: Multidetector CT imaging of the abdomen and pelvis was performed using the standard protocol following bolus administration of intravenous contrast. CONTRAST:  110mL ISOVUE-300 IOPAMIDOL (ISOVUE-300) INJECTION 61% COMPARISON:  05/30/2017 FINDINGS: Lower chest: Diffuse coronary  atherosclerotic calcification. No acute finding Hepatobiliary: No focal liver abnormality.Cholecystectomy. Normal common bile duct diameter. Pancreas: Unremarkable. Spleen: Unremarkable. Adrenals/Urinary Tract: Negative adrenals. End-stage renal disease with atrophic native kidneys that are nonenhancing. A transplant kidney in the right lower quadrant is normal size and enhancing but reportedly nonfunctional. There is transplant moderate hydronephrosis that is new from prior. The urothelium at the level of the ureter appears thickened, although there is no notable straining at the renal pelvis. Mild perinephric stranding that is stable. No abscess is seen. Small volume urinary bladder. Stomach/Bowel: Formed stool throughout the descending colon, sigmoid, and rectum. No bowel obstruction. No appendicitis. Vascular/Lymphatic: Diffuse atherosclerotic calcification without branch occlusion or other acute finding. No mass or adenopathy. Reproductive:No pathologic findings. Other: No pneumoperitoneum.  Trace pelvic ascites Musculoskeletal: No acute abnormalities. Remote T11 inferior endplate fracture. L5-S1 degenerative disc narrowing. IMPRESSION: 1. New hydronephrosis of the right lower quadrant transplant kidney with mild urothelial thickening of the transplant ureter, possible infection in this setting. 2. Formed stool  throughout the left colon, please correlate for constipation. Electronically Signed   By: Monte Fantasia M.D.   On: 03/11/2018 04:26    EKG: Independently reviewed.   Assessment/Plan Principal Problem:   Acute pyelonephritis Case was discussed by Dr. Christy Gentles with the transplant team at [facility].  They had recommended vancomycin and Zosyn.  Continue analgesics and antiemetics for pain.  Follow-up blood cultures and sensitivity.  Check urinalysis, urine culture and sensitivity.  Continue prednisone and hold Prograf.  Continue hydrocodone for pain. Fentanyl 100 mg IVPB for severe pain more intense breakthrough pain. Consult nephrology later today.   Active Problems:   Chronic kidney disease on chronic dialysis [facility]) On Monday, Wednesday Friday HD. Continue Renvela with meals and snack. Consult nephrology later today.    Hypertension associated with diabetes (HCC) Continue amlodipine 5 mg p.o. daily.    Anemia of chronic disease Monitor hematocrit and hemoglobin. Erythropoietin per nephrology.    Hyperlipidemia associated with type 2 diabetes mellitus ([place]) Hold rosuvastatin due to elevated LFTs.    Abnormal LFTs (liver function tests) Hold rosuvastatin. Follow-up transaminases.    Type 2 diabetes mellitus (HCC) Carbohydrate modified diet. Continue weekly Trulicity. Continue Tresiba 80 units SQ daily.    Valvular heart disease In the presence of bacteremia, will check echocardiogram.    DVT prophylaxis: Lovenox SQ. Code Status: Full code. Family Communication: Disposition Plan: Admit for IV antibiotics for several days. Consults called:  Admission status: Inpatient/telemetry.   Reubin Milan MD [facility]  If 7PM-7AM, please contact night-coverage [email or web address] Password [email or web address]  03/11/2018, 5:38 AM

## 2018-03-11 NOTE — Progress Notes (Signed)
Pharmacy Antibiotic Note  [name] is a 62 y.o. female admitted on 03/11/2018 with pyelonephritis of her transplanted kidney. Pharmacy has been consulted for vancomycin and Zosyn  Dosing.  Patient received doses of vancomycin and Zosyn in ED early this morning. Patient is currently receiving dialysis on Mon-Wed-Fri.  Plan: Start vancomycin 750mg  IV every Mon-Wed-Fri after each HD session. Start Zosyn 3.375g IV q12h Pharmacy will continue to monitor patient progress, labs and culture data as appropriate.  Height: 5\' 9"  (175.3 cm) Weight: 140 lb (63.5 kg) IBW/kg (Calculated) : 66.2  Temp (24hrs), Avg:100.6 F (38.1 C), Min:98.1 F (36.7 C), Max:102.4 F (39.1 C)  Recent Labs  Lab 03/11/18 0240 03/11/18 0533  WBC 7.7  --   CREATININE 9.30*  --   LATICACIDVEN  --  1.60    Estimated Creatinine Clearance: 6.2 mL/min (A) (by C-G formula based on SCr of 9.3 mg/dL (H)).    Allergies  Allergen Reactions  . Other Other (See Comments)    Tape Bruises and tears skin, Paper tape tolerated  . Tape Other (See Comments)    Bruises and tears skin Paper tape tolerated    Antimicrobials this admission: ceftriaxone 1/6 >>x1 dose in ED Zosyn 1/6 >>  vancomycin 1/6 >>  Dose adjustments this admission: HD patient   Microbiology results: 1/6 BC x2:  NG <12h 1/6 UCx:      Thank you for allowing pharmacy to be a part of this patient's care.  Despina Pole 03/11/2018 12:58 PM

## 2018-03-11 NOTE — ED Notes (Signed)
Bladder scan was 0ml. 

## 2018-03-11 NOTE — Progress Notes (Signed)
Patient seen and examined, admitted by Dr. Olevia Bowens this morning.  Briefly 63 year old female with anemia of chronic disease, nonobstructive CAD, hypertension, MR, tricuspid regurgitation, PFO, diabetes mellitus type 2 presented with fevers, progressively worsening right flank and right upper quadrant tenderness/abdominal pain.  Also reported fatigue, chills and night sweats.  Denied any dysuria frequency or hematuria.  In ED, temp 101.4 F.  Patient was started on IV vancomycin and Zosyn.  BP (!) 149/54   Pulse 61   Temp 98.1 F (36.7 C) (Oral)   Resp 12   Ht 5\' 9"  (1.753 m)   Wt 63.5 kg   SpO2 100%   BMI 20.67 kg/m   Labs reviewed  Acute pyelonephritis right kidney -Imaging showed new hydronephrosis of the right lower quadrant transplant kidney with mild urothelial thickening of the transplanted ureter, may be the cause of the infection in the setting. -With transplant team at [facility] who had recommended continue care at [facility], -continue IV vancomycin and Zosyn. -Follow blood cultures, urine culture.  UA positive for UTI   ESRD on hemodialysis, MWF - will consult nephrology for hemodialysis  Anemia of chronic disease likely due to ESRD H&H currently stable  Abnormal LFTs -Hold statins, recheck LFTs in a.m. -If worsening, will obtain acute hepatitis panel, CK, right upper quadrant ultrasound  Hyperlipidemia Hold statins for now    Mayan Kloepfer M.D. [facility] 03/11/2018, 12:51 PM  Pager: [phone]

## 2018-03-11 NOTE — ED Notes (Signed)
Pt's husband called this RN to the room, stating that her O2 had dropped to 69% at the lowest. When this RN walked in, O2 was 73% on RA. Placed pt on 2L/min via [place]. pt's O2 increased to 100% have a few deep breaths.

## 2018-03-12 ENCOUNTER — Inpatient Hospital Stay (HOSPITAL_COMMUNITY): Payer: BLUE CROSS/BLUE SHIELD

## 2018-03-12 DIAGNOSIS — N186 End stage renal disease: Secondary | ICD-10-CM

## 2018-03-12 DIAGNOSIS — Z992 Dependence on renal dialysis: Secondary | ICD-10-CM

## 2018-03-12 LAB — COMPREHENSIVE METABOLIC PANEL
ALK PHOS: 82 U/L (ref 38–126)
ALT: 27 U/L (ref 0–44)
AST: 28 U/L (ref 15–41)
Albumin: 2.8 g/dL — ABNORMAL LOW (ref 3.5–5.0)
Anion gap: 12 (ref 5–15)
BUN: 50 mg/dL — ABNORMAL HIGH (ref 8–23)
CO2: 25 mmol/L (ref 22–32)
Calcium: 8.3 mg/dL — ABNORMAL LOW (ref 8.9–10.3)
Chloride: 98 mmol/L (ref 98–111)
Creatinine, Ser: 11.27 mg/dL — ABNORMAL HIGH (ref 0.44–1.00)
GFR calc Af Amer: 4 mL/min — ABNORMAL LOW (ref >60)
GFR calc non Af Amer: 3 mL/min — ABNORMAL LOW (ref >60)
Glucose, Bld: 89 mg/dL (ref 70–99)
Potassium: 5.1 mmol/L (ref 3.5–5.1)
Sodium: 135 mmol/L (ref 135–145)
Total Bilirubin: 0.7 mg/dL (ref 0.3–1.2)
Total Protein: 6.2 g/dL — ABNORMAL LOW (ref 6.5–8.1)

## 2018-03-12 LAB — URINE CULTURE: Culture: NO GROWTH

## 2018-03-12 LAB — CBC WITH DIFFERENTIAL/PLATELET
Abs Immature Granulocytes: 0.03 10*3/uL (ref 0.00–0.07)
Basophils Absolute: 0.1 10*3/uL (ref 0.0–0.1)
Basophils Relative: 1 %
EOS PCT: 11 %
Eosinophils Absolute: 0.6 10*3/uL — ABNORMAL HIGH (ref 0.0–0.5)
HCT: 29.3 % — ABNORMAL LOW (ref 36.0–46.0)
HEMOGLOBIN: 9 g/dL — AB (ref 12.0–15.0)
Immature Granulocytes: 1 %
LYMPHS ABS: 0.3 10*3/uL — AB (ref 0.7–4.0)
Lymphocytes Relative: 5 %
MCH: 30 pg (ref 26.0–34.0)
MCHC: 30.7 g/dL (ref 30.0–36.0)
MCV: 97.7 fL (ref 80.0–100.0)
Monocytes Absolute: 0.5 10*3/uL (ref 0.1–1.0)
Monocytes Relative: 10 %
Neutro Abs: 3.7 10*3/uL (ref 1.7–7.7)
Neutrophils Relative %: 72 %
Platelets: 122 10*3/uL — ABNORMAL LOW (ref 150–400)
RBC: 3 MIL/uL — ABNORMAL LOW (ref 3.87–5.11)
RDW: 12.9 % (ref 11.5–15.5)
WBC: 5.1 10*3/uL (ref 4.0–10.5)
nRBC: 0 % (ref 0.0–0.2)

## 2018-03-12 LAB — GLUCOSE, CAPILLARY
Glucose-Capillary: 80 mg/dL (ref 70–99)
Glucose-Capillary: 91 mg/dL (ref 70–99)
Glucose-Capillary: 84 mg/dL (ref 70–99)
Glucose-Capillary: 135 mg/dL — ABNORMAL HIGH (ref 70–99)

## 2018-03-12 MED ORDER — DOXERCALCIFEROL 4 MCG/2ML IV SOLN
4.0000 ug | INTRAVENOUS | Status: DC
  Administered 2018-03-13: 4 ug via INTRAVENOUS
  Filled 2018-03-12: qty 2

## 2018-03-12 MED ORDER — TACROLIMUS 1 MG PO CAPS
1.0000 mg | ORAL_CAPSULE | Freq: Every morning | ORAL | Status: DC
  Administered 2018-03-12: 1 mg via ORAL
  Filled 2018-03-12 (×2): qty 1

## 2018-03-12 MED ORDER — SODIUM CHLORIDE 0.9 % IV SOLN
INTRAVENOUS | Status: DC | PRN
  Administered 2018-03-12: 500 mL via INTRAVENOUS

## 2018-03-12 MED ORDER — VANCOMYCIN HCL IN DEXTROSE 750-5 MG/150ML-% IV SOLN
750.0000 mg | Freq: Once | INTRAVENOUS | Status: AC
  Administered 2018-03-12: 750 mg via INTRAVENOUS
  Filled 2018-03-12: qty 150

## 2018-03-12 MED ORDER — TACROLIMUS 0.5 MG PO CAPS
0.5000 mg | ORAL_CAPSULE | Freq: Every day | ORAL | Status: DC
  Administered 2018-03-12: 0.5 mg via ORAL
  Filled 2018-03-12: qty 1

## 2018-03-12 MED ORDER — DOXERCALCIFEROL 4 MCG/2ML IV SOLN
4.0000 ug | Freq: Once | INTRAVENOUS | Status: AC
  Administered 2018-03-12: 4 ug via INTRAVENOUS
  Filled 2018-03-12: qty 2

## 2018-03-12 NOTE — Procedures (Signed)
   HEMODIALYSIS TREATMENT NOTE (Tue 7 Jan):  Uneventful 3.5 hour heparin-free dialysis completed.  Difficult cannulation of left forearm AVF with 16g needles, however access tolerated Qb 300cc/min with stable VP/AP.  Goal met: 2 liters removed without interruption in ultrafiltration.  All blood was returned and hemostasis was achieved in 15 minutes.  Hemodynamically stable throughout session.    [name], RN

## 2018-03-12 NOTE — Progress Notes (Addendum)
[facility]                                                                              Patient Demographics  [name], is a 63 y.o. female, DOB - 12-23-1955, WNI:[id number]  Admit date - 03/11/2018   Admitting Physician Reubin Milan, MD  Outpatient Primary MD for the patient is Sharion Balloon, FNP  Outpatient specialists:   LOS - 1  days   Medical records reviewed and are as summarized below:    Chief Complaint  Patient presents with  . Abdominal Pain       Brief summary   Briefly 63 year old female with anemia of chronic disease, nonobstructive CAD, hypertension, MR, tricuspid regurgitation, PFO, diabetes mellitus type 2 presented with fevers, progressively worsening right flank and right upper quadrant tenderness/abdominal pain.  Also reported fatigue, chills and night sweats.  Denied any dysuria frequency or hematuria.  In ED, temp 101.4 F.  Patient was started on IV vancomycin and Zosyn.  Assessment & Plan   Principal problem Acute pyelonephritis right kidney with hydronephrosis of the transplanted kidney -Imaging showed new hydronephrosis of the right lower quadrant transplant kidney with mild urothelial thickening of the transplanted ureter, may be the cause of the infection in the setting.  -EDP discussed with transplant team at [facility] who had recommended continue care at [facility] with IV vancomycin and Zosyn.  -Urine culture showed no growth, follow blood cultures -Nephrology consulted, recommended ultrasound to assess for hydronephrosis, UA  Active problems ESRD on hemodialysis, MWF  -Nephrology consulted, HD planned today of scheduled and back on normal schedule tomorrow  Anemia of chronic disease likely due to ESRD  H&H stable  Abnormal LFTs  -Resolved  Hyperlipidemia  Hold statins for now    Code Status: Full code DVT Prophylaxis:  Lovenox Family Communication: Discussed in detail with the patient, all imaging results,  lab results explained to the patient   Disposition Plan: Plan per nephrology,  Time Spent in minutes   25 minutes  Procedures:  Hemodialysis  Consultants:   Nephrology  Antimicrobials:   IV vancomycin 03/10/2018  IV Zosyn 03/10/18   Medications  Scheduled Meds: . amLODipine  5 mg Oral Daily  . aspirin EC  81 mg Oral Daily  . Chlorhexidine Gluconate Cloth  6 each Topical Q0600  . [START ON 03/13/2018] doxercalciferol  4 mcg Intravenous Q M,W,F-HD  . enoxaparin (LOVENOX) injection  30 mg Subcutaneous Q24H  . insulin aspart  0-5 Units Subcutaneous QHS  . insulin aspart  0-9 Units Subcutaneous TID WC  . insulin glargine  8 Units Subcutaneous Q2200  . isosorbide mononitrate  60 mg Oral Daily  . metoprolol tartrate  12.5 mg Oral BID  . predniSONE  5 mg Oral Q breakfast  . sevelamer carbonate  3,200 mg Oral TID WC  . tacrolimus  0.5 mg Oral Q2200  . tacrolimus  1 mg Oral q morning - 10a   Continuous Infusions: . sodium chloride    . sodium chloride    . piperacillin-tazobactam (ZOSYN)  IV 3.375 g (03/12/18 0528)  . vancomycin 750 mg (03/11/18 2220)  .  vancomycin     PRN Meds:.sodium chloride, sodium chloride, acetaminophen **OR** acetaminophen, fentaNYL (SUBLIMAZE) injection, HYDROcodone-acetaminophen, lidocaine (PF), lidocaine-prilocaine, ondansetron **OR** ondansetron (ZOFRAN) IV, pentafluoroprop-tetrafluoroeth   Antibiotics   Anti-infectives (From admission, onward)   Start     Dose/Rate Route Frequency Ordered Stop   03/12/18 1800  vancomycin (VANCOCIN) IVPB 750 mg/150 ml premix     750 mg 150 mL/hr over 60 Minutes Intravenous  Once 03/12/18 1308     03/11/18 1800  vancomycin (VANCOCIN) IVPB 750 mg/150 ml premix     750 mg 150 mL/hr over 60 Minutes Intravenous Every M-W-F (1800) 03/11/18 1309     03/11/18 1700  piperacillin-tazobactam (ZOSYN) IVPB 3.375 g     3.375 g 12.5 mL/hr over 240 Minutes Intravenous Every 12 hours 03/11/18 1309     03/11/18 0530  cefTRIAXone  (ROCEPHIN) 1 g in sodium chloride 0.9 % 100 mL IVPB  Status:  Discontinued     1 g 200 mL/hr over 30 Minutes Intravenous  Once 03/11/18 0522 03/11/18 0525   03/11/18 0530  vancomycin (VANCOCIN) IVPB 1000 mg/200 mL premix     1,000 mg 200 mL/hr over 60 Minutes Intravenous  Once 03/11/18 0526 03/11/18 0845   03/11/18 0530  piperacillin-tazobactam (ZOSYN) IVPB 3.375 g     3.375 g 100 mL/hr over 30 Minutes Intravenous  Once 03/11/18 0526 03/11/18 4709        Subjective:   Madhavi Hamblen was seen and examined today.  Still having right flank pain, 5/10, slightly better from yesterday due to pain medications.  No nausea or vomiting, fevers or chills.  Denied any chest pain, shortness of breath, dizziness.  Objective:   Vitals:   03/12/18 1100 03/12/18 1115 03/12/18 1145 03/12/18 1215  BP: (!) 124/53 (!) 135/50 (!) 125/56 (!) 133/54  Pulse: 60 (!) 51 (!) 59 60  Resp:      Temp:      TempSrc:      SpO2:      Weight:      Height:        Intake/Output Summary (Last 24 hours) at 03/12/2018 1336 Last data filed at 03/12/2018 1220 Gross per 24 hour  Intake 680.33 ml  Output 2000 ml  Net -1319.67 ml     Wt Readings from Last 3 Encounters:  03/12/18 65.5 kg  01/29/18 67.8 kg  11/26/17 69.1 kg    Physical Exam  General: Alert and oriented x 3, NAD  Eyes:  HEENT:    Cardiovascular: S1 S2 clear, no murmurs, RRR. No pedal edema b/l  Respiratory: CTAB, no wheezing, rales or rhonchi  Gastrointestinal: Soft, nontender, nondistended, NBS, right CVAT  Ext: no pedal edema bilaterally  Neuro: no new deficits  Musculoskeletal: No cyanosis, clubbing  Skin: No rashes  Psych: Normal affect and demeanor, alert and oriented x3    Data Reviewed:  I have personally reviewed following labs and imaging studies  Micro Results Recent Results (from the past 240 hour(s))  Blood culture (routine x 2)     Status: None (Preliminary result)   Collection Time: 03/11/18  5:21 AM  Result  Value Ref Range Status   Specimen Description BLOOD RIGHT ARM  Final   Special Requests   Final    BOTTLES DRAWN AEROBIC AND ANAEROBIC Blood Culture adequate volume   Culture   Final    NO GROWTH 1 DAY Performed at [facility], [address], [place], [place] [address]    Report Status PENDING  Incomplete  Blood culture (routine x 2)     Status: None (Preliminary result)   Collection Time: 03/11/18  5:28 AM  Result Value Ref Range Status   Specimen Description BLOOD RIGHT HAND  Final   Special Requests   Final    BOTTLES DRAWN AEROBIC AND ANAEROBIC Blood Culture adequate volume   Culture   Final    NO GROWTH 1 DAY Performed at [facility], [address] [place], [place] [address]    Report Status PENDING  Incomplete  Urine culture     Status: None   Collection Time: 03/11/18  8:44 AM  Result Value Ref Range Status   Specimen Description   Final    URINE, CLEAN CATCH Performed at [facility], [address] [place], [place] [address]    Special Requests   Final    NONE Performed at [facility], [address], [place], [place] [address]    Culture   Final    NO GROWTH Performed at [facility], [place] [address], [place], [place] [address]    Report Status 03/12/2018 FINAL  Final    Radiology Reports Ct Abdomen Pelvis W Contrast  Result Date: 03/11/2018 CLINICAL DATA:  Right-sided abdominal pain with vomiting for 10 days EXAM: CT ABDOMEN AND PELVIS WITH CONTRAST TECHNIQUE: Multidetector CT imaging of the abdomen and pelvis was performed using the standard protocol following bolus administration of intravenous contrast. CONTRAST:  184mL ISOVUE-300 IOPAMIDOL (ISOVUE-300) INJECTION 61% COMPARISON:  05/30/2017 FINDINGS: Lower chest: Diffuse coronary atherosclerotic calcification. No acute finding Hepatobiliary: No focal liver abnormality.Cholecystectomy. Normal common bile duct diameter. Pancreas: Unremarkable. Spleen: Unremarkable. Adrenals/Urinary Tract: Negative adrenals.  End-stage renal disease with atrophic native kidneys that are nonenhancing. A transplant kidney in the right lower quadrant is normal size and enhancing but reportedly nonfunctional. There is transplant moderate hydronephrosis that is new from prior. The urothelium at the level of the ureter appears thickened, although there is no notable straining at the renal pelvis. Mild perinephric stranding that is stable. No abscess is seen. Small volume urinary bladder. Stomach/Bowel: Formed stool throughout the descending colon, sigmoid, and rectum. No bowel obstruction. No appendicitis. Vascular/Lymphatic: Diffuse atherosclerotic calcification without branch occlusion or other acute finding. No mass or adenopathy. Reproductive:No pathologic findings. Other: No pneumoperitoneum.  Trace pelvic ascites Musculoskeletal: No acute abnormalities. Remote T11 inferior endplate fracture. L5-S1 degenerative disc narrowing. IMPRESSION: 1. New hydronephrosis of the right lower quadrant transplant kidney with mild urothelial thickening of the transplant ureter, possible infection in this setting. 2. Formed stool throughout the left colon, please correlate for constipation. Electronically Signed   By: Monte Fantasia M.D.   On: 03/11/2018 04:26    Lab Data:  CBC: Recent Labs  Lab 03/11/18 0240 03/12/18 0543  WBC 7.7 5.1  NEUTROABS 6.1 3.7  HGB 10.7* 9.0*  HCT 34.3* 29.3*  MCV 95.3 97.7  PLT 131* 841*   Basic Metabolic Panel: Recent Labs  Lab 03/11/18 0240 03/12/18 0543  NA 138 135  K 4.5 5.1  CL 103 98  CO2 24 25  GLUCOSE 270* 89  BUN 42* 50*  CREATININE 9.30* 11.27*  CALCIUM 8.5* 8.3*   GFR: Estimated Creatinine Clearance: 5.3 mL/min (A) (by C-G formula based on SCr of 11.27 mg/dL (H)). Liver Function Tests: Recent Labs  Lab 03/11/18 0240 03/12/18 0543  AST 80* 28  ALT 48* 27  ALKPHOS 118 82  BILITOT 0.5 0.7  PROT 7.0 6.2*  ALBUMIN 3.3* 2.8*   Recent Labs  Lab  03/11/18 0240  LIPASE 54*    No results for input(s): AMMONIA in the last 168 hours. Coagulation Profile: No results for input(s): INR, PROTIME in the last 168 hours. Cardiac Enzymes: No results for input(s): CKTOTAL, CKMB, CKMBINDEX, TROPONINI in the last 168 hours. BNP (last 3 results) No results for input(s): PROBNP in the last 8760 hours. HbA1C: Recent Labs    03/11/18 0240  HGBA1C 8.1*   CBG: Recent Labs  Lab 03/11/18 1237 03/11/18 1641 03/11/18 2225 03/12/18 0800 03/12/18 1143  GLUCAP 130* 110* 122* 80 91   Lipid Profile: No results for input(s): CHOL, HDL, LDLCALC, TRIG, CHOLHDL, LDLDIRECT in the last 72 hours. Thyroid Function Tests: No results for input(s): TSH, T4TOTAL, FREET4, T3FREE, THYROIDAB in the last 72 hours. Anemia Panel: No results for input(s): VITAMINB12, FOLATE, FERRITIN, TIBC, IRON, RETICCTPCT in the last 72 hours. Urine analysis:    Component Value Date/Time   COLORURINE YELLOW 03/11/2018 0844   APPEARANCEUR HAZY (A) 03/11/2018 0844   APPEARANCEUR Clear 06/04/2017 1436   LABSPEC 1.016 03/11/2018 0844   PHURINE 6.0 03/11/2018 0844   GLUCOSEU 150 (A) 03/11/2018 0844   HGBUR MODERATE (A) 03/11/2018 0844   BILIRUBINUR NEGATIVE 03/11/2018 0844   BILIRUBINUR Negative 06/04/2017 [place] 03/11/2018 0844   PROTEINUR 100 (A) 03/11/2018 0844   NITRITE NEGATIVE 03/11/2018 0844   LEUKOCYTESUR MODERATE (A) 03/11/2018 0844   LEUKOCYTESUR Negative 06/04/2017 1436     Jamonta Goerner M.D. [facility] 03/12/2018, 1:36 PM  Pager: [phone] Between 7am to 7pm - call Pager - [phone]  After 7pm go to [email or web address] - password [email or web address]  Call night coverage person covering after 7pm

## 2018-03-12 NOTE — Consult Note (Signed)
Reason for Consult: To manage dialysis and dialysis related needs Referring Physician: Dr. Renato Gails is an 63 y.o. female.  HPI: Pt is a 3F with a PMH sig for ESRD on HD s/p failed transplant, HTN, HLD, DM, CAD, tricuspid regurg, PFO who is now seen in consultation at the request of Dr. Tana Coast for management of ESRD and provision of HD.    Pt started having fevers and R flank tenderness/ abd pain starting Sunday night which was progressively worsening; however she stated that she had really been feeling nauseated and unwell for 10 days prior to that.  Presented to [facility] ED Monday AM.  Pt was found to have a low-grade fever, WBC 7.7.  UA showed RBC and WBC with culture 03/11/2018 negative.  CT abd showed new R transplant hydro with urothelial thickening.  No stones.  She was placed on IV antibiotics with vanc/ Zosyn.  In this setting we are asked to see.    Pt has been back on HD for about 2 years now since her failed transplant.  She is still taking pred 5 mg daily and tacrolimus 1 q AM and 0.5 q PM although she is trying to be weaned off Recently saw [facility] for consideration of re-transplant and they advised her to complete PT and then present again for evaluation/ listing.    Currently pt is feeling better.     Dialysis orders: [facility] MWF 3 hrs 30 min EDW 67.0 kg Revaclear 300 2K/2.5 Ca bath, BFR 300, 16 g needle No heparin hectorol 4 mcg TIW venofer 50 mg q week  Past Medical History:  Diagnosis Date  . Anemia    of chronic disease  . Blood transfusion without reported diagnosis   . Cardiovascular disease    nonobstructive  . Carotid artery stenosis 2008  . CHF (congestive heart failure) ([place])   . Coronary artery disease   . Diabetes mellitus   . Dyslipidemia   . Edema of lower extremity    with hypo-albuminemia and profound protenuria  . Heart murmur   . Hypertension   . Mitral regurgitation   . Nephrotic syndrome   . Patent foramen ovale   . Pulmonary hypertension,  moderate to severe ([place])   . Pulmonary nodule   . Tricuspid regurgitation   . Volume depletion, renal, due to output loss (renal deficit)     Past Surgical History:  Procedure Laterality Date  . A/V FISTULAGRAM N/A 04/05/2017   Procedure: A/V FISTULAGRAM - Left Arm;  Surgeon: Angelia Mould, MD;  Location: [place] [facility];  Service: Cardiovascular;  Laterality: N/A;  . A/V FISTULAGRAM Left 09/18/2017   Procedure: A/V FISTULAGRAM;  Surgeon: Serafina Mitchell, MD;  Location: [place] [facility];  Service: Cardiovascular;  Laterality: Left;  . A/V SHUNT INTERVENTION Left 04/05/2017   Procedure: A/V SHUNT INTERVENTION;  Surgeon: Angelia Mould, MD;  Location: [place] [facility];  Service: Cardiovascular;  Laterality: Left;  . CARDIAC CATHETERIZATION  2008  . IR REMOVAL TUN CV CATH W/O FL  11/02/2016  . 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 PERIPHERAL VASCULAR BALLOON ANGIOPLASTY Left 09/18/2017   Procedure: PERIPHERAL VASCULAR BALLOON ANGIOPLASTY;  Surgeon: Serafina Mitchell, MD;  Location: [place] [facility];  Service: Cardiovascular;  Laterality: Left;  Arm Fistula    Family History  Problem Relation Age of Onset  . Kidney disease Mother   . Diabetes Mother   . Diabetes Father   . Heart disease  Father     Social History:  reports that she has never smoked. She has never used smokeless tobacco. She reports that she does not drink alcohol or use drugs.  Allergies:  Allergies  Allergen Reactions  . Other Other (See Comments)    Tape Bruises and tears skin, Paper tape tolerated  . Tape Other (See Comments)    Bruises and tears skin Paper tape tolerated    Medications:  Scheduled: . amLODipine  5 mg Oral Daily  . aspirin EC  81 mg Oral Daily  . Chlorhexidine Gluconate Cloth  6 each Topical Q0600  . enoxaparin (LOVENOX) injection  30 mg Subcutaneous Q24H  . insulin aspart  0-5 Units Subcutaneous QHS  . insulin aspart  0-9 Units Subcutaneous TID WC  . insulin  glargine  8 Units Subcutaneous Q2200  . isosorbide mononitrate  60 mg Oral Daily  . metoprolol tartrate  12.5 mg Oral BID  . predniSONE  5 mg Oral Q breakfast  . sevelamer carbonate  3,200 mg Oral TID WC     Results for orders placed or performed during the hospital encounter of 03/11/18 (from the past 48 hour(s))  CBC with Differential/Platelet     Status: Abnormal   Collection Time: 03/11/18  2:40 AM  Result Value Ref Range   WBC 7.7 4.0 - 10.5 K/uL   RBC 3.60 (L) 3.87 - 5.11 MIL/uL   Hemoglobin 10.7 (L) 12.0 - 15.0 g/dL   HCT 34.3 (L) 36.0 - 46.0 %   MCV 95.3 80.0 - 100.0 fL   MCH 29.7 26.0 - 34.0 pg   MCHC 31.2 30.0 - 36.0 g/dL   RDW 12.7 11.5 - 15.5 %   Platelets 131 (L) 150 - 400 K/uL   nRBC 0.0 0.0 - 0.2 %   Neutrophils Relative % 78 %   Neutro Abs 6.1 1.7 - 7.7 K/uL   Lymphocytes Relative 6 %   Lymphs Abs 0.5 (L) 0.7 - 4.0 K/uL   Monocytes Relative 8 %   Monocytes Absolute 0.6 0.1 - 1.0 K/uL   Eosinophils Relative 6 %   Eosinophils Absolute 0.4 0.0 - 0.5 K/uL   Basophils Relative 1 %   Basophils Absolute 0.0 0.0 - 0.1 K/uL   Immature Granulocytes 1 %   Abs Immature Granulocytes 0.04 0.00 - 0.07 K/uL    Comment: Performed at [facility], [address], [place], [place] [address]  Comprehensive metabolic panel     Status: Abnormal   Collection Time: 03/11/18  2:40 AM  Result Value Ref Range   Sodium 138 135 - 145 mmol/L   Potassium 4.5 3.5 - 5.1 mmol/L   Chloride 103 98 - 111 mmol/L   CO2 24 22 - 32 mmol/L   Glucose, Bld 270 (H) 70 - 99 mg/dL   BUN 42 (H) 8 - 23 mg/dL   Creatinine, Ser 9.30 (H) 0.44 - 1.00 mg/dL   Calcium 8.5 (L) 8.9 - 10.3 mg/dL   Total Protein 7.0 6.5 - 8.1 g/dL   Albumin 3.3 (L) 3.5 - 5.0 g/dL   AST 80 (H) 15 - 41 U/L   ALT 48 (H) 0 - 44 U/L   Alkaline Phosphatase 118 38 - 126 U/L   Total Bilirubin 0.5 0.3 - 1.2 mg/dL   GFR calc non Af Amer 4 (L) >60 mL/min   GFR calc Af Amer 5 (L) >60 mL/min   Anion gap 11 5 - 15    Comment: Performed  at [facility], [address] [place], [place] [address]  Lipase, blood     Status: Abnormal   Collection Time: 03/11/18  2:40 AM  Result Value Ref Range   Lipase 54 (H) 11 - 51 U/L    Comment: Performed at [facility], [address] [place], [place] [address]  Hemoglobin A1c     Status: Abnormal   Collection Time: 03/11/18  2:40 AM  Result Value Ref Range   Hgb A1c MFr Bld 8.1 (H) 4.8 - 5.6 %    Comment: (NOTE) Pre diabetes:          5.7%-6.4% Diabetes:              >6.4% Glycemic control for   <7.0% adults with diabetes    Mean Plasma Glucose 185.77 mg/dL    Comment: Performed at [facility] [address] [place], [place] [address]  Blood culture (routine x 2)     Status: None (Preliminary result)   Collection Time: 03/11/18  5:21 AM  Result Value Ref Range   Specimen Description BLOOD RIGHT ARM    Special Requests      BOTTLES DRAWN AEROBIC AND ANAEROBIC Blood Culture adequate volume   Culture      NO GROWTH 1 DAY Performed at [facility], [address] [place], [place] [address]    Report Status PENDING   Blood culture (routine x 2)     Status: None (Preliminary result)   Collection Time: 03/11/18  5:28 AM  Result Value Ref Range   Specimen Description BLOOD RIGHT HAND    Special Requests      BOTTLES DRAWN AEROBIC AND ANAEROBIC Blood Culture adequate volume   Culture      NO GROWTH 1 DAY Performed at [facility], [address], [place], [place] [address]    Report Status PENDING   I-Stat CG4 Lactic Acid, ED     Status: None   Collection Time: 03/11/18  5:33 AM  Result Value Ref Range   Lactic Acid, Venous 1.60 0.5 - 1.9 mmol/L  Urinalysis, Routine w reflex microscopic     Status: Abnormal   Collection Time: 03/11/18  8:44 AM  Result Value Ref Range   Color, Urine YELLOW YELLOW   APPearance HAZY (A) CLEAR   Specific Gravity, Urine 1.016 1.005 - 1.030   pH 6.0 5.0 - 8.0   Glucose, UA 150 (A) NEGATIVE mg/dL   Hgb urine dipstick MODERATE (A)  NEGATIVE   Bilirubin Urine NEGATIVE NEGATIVE   Ketones, ur NEGATIVE NEGATIVE mg/dL   Protein, ur 100 (A) NEGATIVE mg/dL   Nitrite NEGATIVE NEGATIVE   Leukocytes, UA MODERATE (A) NEGATIVE   RBC / HPF 21-50 0 - 5 RBC/hpf   WBC, UA 21-50 0 - 5 WBC/hpf   Bacteria, UA RARE (A) NONE SEEN   Squamous Epithelial / LPF 0-5 0 - 5   Hyaline Casts, UA PRESENT    Granular Casts, UA PRESENT    Non Squamous Epithelial 0-5 (A) NONE SEEN    Comment: Performed at [facility], [address], [place], [place] [address]  Urine culture     Status: None   Collection Time: 03/11/18  8:44 AM  Result Value Ref Range   Specimen Description      URINE, CLEAN CATCH Performed at [facility], [address] [place], [place] [address]    Special Requests      NONE Performed at [facility], [address] [place],  [address]  Culture      NO GROWTH Performed at [facility], [place] [address] [place], [place] [address]    Report Status 03/12/2018 FINAL   CBG monitoring, ED     Status: Abnormal   Collection Time: 03/11/18 12:37 PM  Result Value Ref Range   Glucose-Capillary 130 (H) 70 - 99 mg/dL  Glucose, capillary     Status: Abnormal   Collection Time: 03/11/18  4:41 PM  Result Value Ref Range   Glucose-Capillary 110 (H) 70 - 99 mg/dL  Glucose, capillary     Status: Abnormal   Collection Time: 03/11/18 10:25 PM  Result Value Ref Range   Glucose-Capillary 122 (H) 70 - 99 mg/dL   Comment 1 Document in Chart   CBC WITH DIFFERENTIAL     Status: Abnormal   Collection Time: 03/12/18  5:43 AM  Result Value Ref Range   WBC 5.1 4.0 - 10.5 K/uL   RBC 3.00 (L) 3.87 - 5.11 MIL/uL   Hemoglobin 9.0 (L) 12.0 - 15.0 g/dL   HCT 29.3 (L) 36.0 - 46.0 %   MCV 97.7 80.0 - 100.0 fL   MCH 30.0 26.0 - 34.0 pg   MCHC 30.7 30.0 - 36.0 g/dL   RDW 12.9 11.5 - 15.5 %   Platelets 122 (L) 150 - 400 K/uL   nRBC 0.0 0.0 - 0.2 %   Neutrophils Relative % 72 %   Neutro Abs 3.7 1.7 - 7.7 K/uL   Lymphocytes  Relative 5 %   Lymphs Abs 0.3 (L) 0.7 - 4.0 K/uL   Monocytes Relative 10 %   Monocytes Absolute 0.5 0.1 - 1.0 K/uL   Eosinophils Relative 11 %   Eosinophils Absolute 0.6 (H) 0.0 - 0.5 K/uL   Basophils Relative 1 %   Basophils Absolute 0.1 0.0 - 0.1 K/uL   Immature Granulocytes 1 %   Abs Immature Granulocytes 0.03 0.00 - 0.07 K/uL    Comment: Performed at [facility], [address], [place], [place] [address]  Comprehensive metabolic panel     Status: Abnormal   Collection Time: 03/12/18  5:43 AM  Result Value Ref Range   Sodium 135 135 - 145 mmol/L   Potassium 5.1 3.5 - 5.1 mmol/L   Chloride 98 98 - 111 mmol/L   CO2 25 22 - 32 mmol/L   Glucose, Bld 89 70 - 99 mg/dL   BUN 50 (H) 8 - 23 mg/dL   Creatinine, Ser 11.27 (H) 0.44 - 1.00 mg/dL   Calcium 8.3 (L) 8.9 - 10.3 mg/dL   Total Protein 6.2 (L) 6.5 - 8.1 g/dL   Albumin 2.8 (L) 3.5 - 5.0 g/dL   AST 28 15 - 41 U/L   ALT 27 0 - 44 U/L   Alkaline Phosphatase 82 38 - 126 U/L   Total Bilirubin 0.7 0.3 - 1.2 mg/dL   GFR calc non Af Amer 3 (L) >60 mL/min   GFR calc Af Amer 4 (L) >60 mL/min   Anion gap 12 5 - 15    Comment: Performed at [facility], [address], [place], [place] [address]  Glucose, capillary     Status: None   Collection Time: 03/12/18  8:00 AM  Result Value Ref Range   Glucose-Capillary 80 70 - 99 mg/dL    Ct Abdomen Pelvis W Contrast  Result Date: 03/11/2018 CLINICAL DATA:  Right-sided abdominal pain with vomiting for 10 days EXAM: CT ABDOMEN AND PELVIS WITH CONTRAST TECHNIQUE: Multidetector CT imaging of  the abdomen and pelvis was performed using the standard protocol following bolus administration of intravenous contrast. CONTRAST:  183mL ISOVUE-300 IOPAMIDOL (ISOVUE-300) INJECTION 61% COMPARISON:  05/30/2017 FINDINGS: Lower chest: Diffuse coronary atherosclerotic calcification. No acute finding Hepatobiliary: No focal liver abnormality.Cholecystectomy. Normal common bile duct diameter. Pancreas: Unremarkable.  Spleen: Unremarkable. Adrenals/Urinary Tract: Negative adrenals. End-stage renal disease with atrophic native kidneys that are nonenhancing. A transplant kidney in the right lower quadrant is normal size and enhancing but reportedly nonfunctional. There is transplant moderate hydronephrosis that is new from prior. The urothelium at the level of the ureter appears thickened, although there is no notable straining at the renal pelvis. Mild perinephric stranding that is stable. No abscess is seen. Small volume urinary bladder. Stomach/Bowel: Formed stool throughout the descending colon, sigmoid, and rectum. No bowel obstruction. No appendicitis. Vascular/Lymphatic: Diffuse atherosclerotic calcification without branch occlusion or other acute finding. No mass or adenopathy. Reproductive:No pathologic findings. Other: No pneumoperitoneum.  Trace pelvic ascites Musculoskeletal: No acute abnormalities. Remote T11 inferior endplate fracture. L5-S1 degenerative disc narrowing. IMPRESSION: 1. New hydronephrosis of the right lower quadrant transplant kidney with mild urothelial thickening of the transplant ureter, possible infection in this setting. 2. Formed stool throughout the left colon, please correlate for constipation. Electronically Signed   By: Monte Fantasia M.D.   On: 03/11/2018 04:26    ROS: all other systems reviewed and are negative except per HPI Blood pressure (!) 120/51, pulse (!) 52, temperature 98 F (36.7 C), temperature source Oral, resp. rate 16, height 5\' 9"  (1.753 m), weight 65.5 kg, SpO2 95 %. .  GEN: NAD, sitting in bed HEENT EOMI PERRL MMM NECK no JVD PULM clear bilaterally no c/w/r CV RRR soft systolic murmur no r/g ABD soft, RLQ tenderness over allograft, no rebound or guarding EXT no LE edema NEURO AAO x 3 nonfocal ACCESS: L FA AVF accessed   Assessment/Plan: 1 Presumed transplant pyelo- getting IV vanc and zosyn.  Cultures (both blood and urine) negative so far.  I'm going to  order a repeat US for tomorrow to assess hydro.  She has no history of kidney stones so I doubt she's formed one.  Infection can certainly cause hydro as well as rejection of the renal transplant.  I'm going to repeat UA tomorrow as well to monitor for resolution of blood and WBCs.   2 ESRD: dialyze off schedule today and then back on normal sched tomorrow 3 Hypertension: not an issue at this time 4. Anemia of ESRD: no ESA, gets venofer 5. Metabolic Bone Disease: renvela as binder 6.  S/p failed renal transplant- prednisone 5 mg and tac 1 q AM and 0.5 q PM.    Kristiana Jacko 03/12/2018, 9:34 AM

## 2018-03-12 NOTE — Procedures (Signed)
Patient seen and examined on Hemodialysis and the procedure was supervised.    BP (!) 118/53   Pulse (!) 59   Temp 98 F (36.7 C) (Oral)   Resp 16   Ht 5\' 9"  (1.753 m)   Wt 65.5 kg   SpO2 95%   BMI 21.32 kg/m   QB 300 mL/ min via L FA AV with 16 g needles UF goal 2.3L.  Tolerating treatment without complaints at this time.  Will continue to monitor and adjust as needed.    Madelon Lips MD [facility] pgr [phone] 10:01 AM

## 2018-03-13 DIAGNOSIS — E1169 Type 2 diabetes mellitus with other specified complication: Secondary | ICD-10-CM

## 2018-03-13 DIAGNOSIS — I10 Essential (primary) hypertension: Secondary | ICD-10-CM

## 2018-03-13 DIAGNOSIS — N1330 Unspecified hydronephrosis: Secondary | ICD-10-CM

## 2018-03-13 DIAGNOSIS — E1159 Type 2 diabetes mellitus with other circulatory complications: Secondary | ICD-10-CM

## 2018-03-13 DIAGNOSIS — E785 Hyperlipidemia, unspecified: Secondary | ICD-10-CM

## 2018-03-13 LAB — CBC WITH DIFFERENTIAL/PLATELET
ABS IMMATURE GRANULOCYTES: 0.03 10*3/uL (ref 0.00–0.07)
Basophils Absolute: 0.1 10*3/uL (ref 0.0–0.1)
Basophils Relative: 1 %
Eosinophils Absolute: 0.6 10*3/uL — ABNORMAL HIGH (ref 0.0–0.5)
Eosinophils Relative: 13 %
HCT: 31.8 % — ABNORMAL LOW (ref 36.0–46.0)
Hemoglobin: 9.9 g/dL — ABNORMAL LOW (ref 12.0–15.0)
IMMATURE GRANULOCYTES: 1 %
Lymphocytes Relative: 12 %
Lymphs Abs: 0.6 10*3/uL — ABNORMAL LOW (ref 0.7–4.0)
MCH: 29.6 pg (ref 26.0–34.0)
MCHC: 31.1 g/dL (ref 30.0–36.0)
MCV: 95.2 fL (ref 80.0–100.0)
Monocytes Absolute: 0.5 10*3/uL (ref 0.1–1.0)
Monocytes Relative: 10 %
Neutro Abs: 2.9 10*3/uL (ref 1.7–7.7)
Neutrophils Relative %: 63 %
Platelets: 126 10*3/uL — ABNORMAL LOW (ref 150–400)
RBC: 3.34 MIL/uL — ABNORMAL LOW (ref 3.87–5.11)
RDW: 12.8 % (ref 11.5–15.5)
WBC: 4.6 10*3/uL (ref 4.0–10.5)
nRBC: 0 % (ref 0.0–0.2)

## 2018-03-13 LAB — GLUCOSE, CAPILLARY
Glucose-Capillary: 83 mg/dL (ref 70–99)
Glucose-Capillary: 100 mg/dL — ABNORMAL HIGH (ref 70–99)
Glucose-Capillary: 125 mg/dL — ABNORMAL HIGH (ref 70–99)

## 2018-03-13 LAB — COMPREHENSIVE METABOLIC PANEL
ALT: 24 U/L (ref 0–44)
AST: 28 U/L (ref 15–41)
Albumin: 2.8 g/dL — ABNORMAL LOW (ref 3.5–5.0)
Alkaline Phosphatase: 82 U/L (ref 38–126)
Anion gap: 10 (ref 5–15)
BUN: 23 mg/dL (ref 8–23)
CO2: 28 mmol/L (ref 22–32)
Calcium: 8.4 mg/dL — ABNORMAL LOW (ref 8.9–10.3)
Chloride: 99 mmol/L (ref 98–111)
Creatinine, Ser: 6.59 mg/dL — ABNORMAL HIGH (ref 0.44–1.00)
GFR calc Af Amer: 7 mL/min — ABNORMAL LOW (ref >60)
GFR calc non Af Amer: 6 mL/min — ABNORMAL LOW (ref >60)
GLUCOSE: 86 mg/dL (ref 70–99)
Potassium: 4 mmol/L (ref 3.5–5.1)
Sodium: 137 mmol/L (ref 135–145)
Total Bilirubin: 0.9 mg/dL (ref 0.3–1.2)
Total Protein: 6.4 g/dL — ABNORMAL LOW (ref 6.5–8.1)

## 2018-03-13 MED ORDER — CEFUROXIME AXETIL 250 MG PO TABS: 250.0000 mg | ORAL_TABLET | Freq: Two times a day (BID) | ORAL | 0 refills | Status: AC

## 2018-03-13 NOTE — Procedures (Addendum)
    HEMODIALYSIS TREATMENT NOTE (Wed 8 Jan):  3.5 hour heparin-free dialysis completed.  Left forearm AVF tolerated prescribed Qb 300 with stable VP/AP.  Pre-dialysis weight 1.1kg below EDW.  After d/w Dr. Hollie Salk, 1 liter removed without interruption in ultrafiltration.  All blood was returned.  Hemodynamically stable throughout treatment which ended at 1845.  Orthostatic vitals taken post-HD @1900 : 150/53 - 70  Lying 120/55 - 77  Sitting 108/48 - 69  Standing, "I feel woozy."  200cc NS bolus given and pt lay reclined x 15 minutes.   @1915 : 130/50 - 64 sitting 83/40 - 84 standing "This happens sometimes [at outpatient HD ctr].  I have to sit there for a while 'til it comes up." 200cc NS bolus given  @1930 : 90/41 - 81 standing.  Another 200cc NS bolus given.  @1955 : 116/58 - 75  Sitting 99/44 - 66  Standing.  "I feel fine."  Arterial needle removed and hemostasis achieved in 8 minutes.  @2005 : 91/50 - 72 standing.  Stated she felt fine, but unable to remain standing to check weight.  Another 200cc NS bolus given.  @2020 : 140/57 - 75  Sitting  104/53 - 76  Standing.  Post weight obtained--66.0 kg, standing scale (EDW 67kg).  Venous needle removed and hemostasis achieved in 7 minutes.  Will communicate events to Dr. Lowanda Foster, primary nephrologist, tomorrow.  Report given to Jeri Modena, RN.  [name], RN

## 2018-03-13 NOTE — Discharge Summary (Signed)
Physician Discharge Summary  [name] DVV:[id number] DOB: June 15, 1955 DOA: 03/11/2018  PCP: Sharion Balloon, FNP  Admit date: 03/11/2018 Discharge date: 03/13/2018  Time spent: 35 minutes  Recommendations for Outpatient Follow-up:  1. Repeat urinalysis to look for hematuria/improvement in her pyuria.  2. Repeat BMET to follow electrolytes 3. Repeat renal US to follow hydronephrosis status after completing antibiotics.  4. Outpatient follow up with renal transplant team in 2-3 weeks.    Discharge Diagnoses:  Principal Problem:   Acute pyelonephritis Active Problems:   Hypertension associated with diabetes ([place])   ESRD (end stage renal disease) ([place])   Anemia of chronic disease   Hyperlipidemia associated with type 2 diabetes mellitus (HCC)   Chronic kidney disease on chronic dialysis (HCC)   Abnormal LFTs (liver function tests)   Type 2 diabetes mellitus ([place])   Valvular heart disease   Hydronephrosis   Discharge Condition: Stable and improved.  Patient discharged home with instruction to follow-up with PCP in 10 days.  Diet recommendation: Renal diet and modify carbohydrates.  Filed Weights   03/12/18 0840 03/12/18 1230 03/13/18 1500  Weight: 68.5 kg 67 kg 65.9 kg    History of present illness:  As per H&P written by Dr. Tennis Must on 03/11/2018 63 y.o. female with medical history significant of anemia renal disease, nonobstructive CAD, carotid artery disease, hypertension, pulmonary hypertension, mitral regurgitation, tricuspid regurgitation, patent foramen ovale, hyperlipidemia, type 2 diabetes who is coming to the emergency department with complaints of fever, associated with progressively worse right flank and RUQ tenderness, fatigue, chills and night sweats.  She denies dysuria, frequency or hematuria.  However she urinates less than usual due to ESRD.  She denies headache, sore throat, rhinorrhea, dyspnea, wheezing, hemoptysis, chest pain, palpitations, diaphoresis, PND,  orthopnea or pitting edema of the lower extremities.  She has decreased appetite, frequent nausea and constipation, but denies emesis, diarrhea, melena or hematochezia.  She denies polyuria, polydipsia, polyphagia or blurred vision.  No heat or cold intolerance.  She denies skin rashes or pruritus.  ED Course: Initial vital signs were temperature 101.4 F, pulse 78, respirations 20, blood pressure 155/52 mmHg and O2 sat 99% on room air.  The patient received 650 mg of Tylenol p.o. x1, fentanyl 100 mcg IVP x1, Zofran 4 mg IVP x1, vancomycin and Zosyn.  Her white count was 7.7, hemoglobin 10.7 g/dL and platelets 131.  CMP shows normal electrolytes when calcium is corrected to an albumin of 3.3 g/dL.  BUN is 42, creatinine 9.3 and glucose 270 mg/dL.  AST was 80 and ALT was 48 units/L.  Total protein, alkaline phosphatase and total bilirubin are within normal limits.  Lactic acid is 1.60 mmol/L.  Hospital Course:  1-acute pyelonephritis right kidney with hydronephrosis of transplanted kidney -Imaging demonstrated moderate hydronephrosis has remained stable after 48 hours of IV antibiotics -Patient no longer febrile, having any nausea or significant abdominal pain. -Patient denies dysuria -At this moment will discontinue broad-spectrum antibiotics and patient will be discharged on Ceftin to complete 7 more days of antibiotic therapy. -Following nephrology recommendations patient will need repeat renal ultrasound after completing antibiotic therapy to reassess on hydronephrosis status and also follow-up on her urinalysis.  2-end-stage renal disease on hemodialysis Monday Wednesday and Friday -Appreciate nephrology consult during hospitalization hemodialysis has been done on Tuesday and then again on Wednesday to resume normal dialysis schedule. -Continue outpatient follow-up with nephrology/transplant service. -Continue Renvela  3-status post failed renal transplant -At this point continue  prednisone  5 mg daily and continue tacrolimus (1 mg in the morning and half milligram in the evening).  4-hypertension -Blood pressure stable and well-controlled -Will continue the use of home antihypertensive regimen (using hydralazine and amlodipine)  5-anemia of ESRD -continue IV iron and follow renal recommendations for ESA.  6-type 2 diabetes with nephropathy -resume home hypoglycemic regimen.  Procedures:  See below for x-ray reports.  Consultations:  Nephrology  Discharge Exam: Vitals:   03/13/18 1545 03/13/18 1615  BP: 139/63 (!) 144/55  Pulse: 60 62  Resp:    Temp:    SpO2:      General: Afebrile.  Clinically improved and denies any nausea or vomiting.  Tolerating diet without problem.  Improved abdominal discomfort reported.  No dysuria. Cardiovascular: S1 and S2, no rubs, no gallops, no JVD. Respiratory: Good air movement bilaterally. Abdomen: Soft, no guarding, positive bowel sounds, mild discomfort in her right lower quadrant. Extremities: No edema, no cyanosis or clubbing.  Discharge Instructions   Discharge Instructions    Diet - low sodium heart healthy   Complete by:  As directed    Discharge instructions   Complete by:  As directed    Arrange follow up with PCP in 10 days Follow up with nephrology service in 2-3 weeks Keep yourself well hydrated  Take medications as prescribed  Follow renal/modified carbohydrates diet     Allergies as of 03/13/2018      Reactions   Other Other (See Comments)   Tape Bruises and tears skin, Paper tape tolerated   Tape Other (See Comments)   Bruises and tears skin Paper tape tolerated      Medication List    TAKE these medications   acetaminophen 500 MG tablet Commonly known as:  TYLENOL Take 500 mg by mouth every 6 (six) hours as needed for moderate pain or headache.   amLODipine 5 MG tablet Commonly known as:  NORVASC TAKE 5 MG BY MOUTH NIGHTLY What changed:    how much to take  how to take  this  when to take this   ASPIR-LOW 81 MG EC tablet Generic drug:  aspirin Take 81 mg by mouth daily.   cefUROXime 250 MG tablet Commonly known as:  CEFTIN Take 1 tablet (250 mg total) by mouth 2 (two) times daily for 7 days.   Dulaglutide 1.5 MG/0.5ML Sopn Commonly known as:  TRULICITY INJECT 1.5 MG INTO THE SKIN ONCE A WEEK What changed:    how much to take  how to take this  when to take this   insulin degludec 100 UNIT/ML Sopn FlexTouch Pen Commonly known as:  TRESIBA FLEXTOUCH Inject 0.08 mLs (8 Units total) into the skin daily at 10 pm.   isosorbide mononitrate 60 MG 24 hr tablet Commonly known as:  IMDUR TAKE 1 TABLET BY MOUTH ONCE DAILY   lidocaine-prilocaine cream Commonly known as:  EMLA Apply 1 application topically as needed (for dialysis).   metoprolol tartrate 25 MG tablet Commonly known as:  LOPRESSOR Take 0.5 tablets (12.5 mg total) by mouth 2 (two) times daily.   ondansetron 4 MG disintegrating tablet Commonly known as:  ZOFRAN ODT 4mg  ODT q4 hours prn nausea/vomit What changed:    how much to take  how to take this  when to take this   predniSONE 5 MG tablet Commonly known as:  DELTASONE TAKE 1 TABLET BY MOUTH ONCE DAILY WITH BREAKFAST What changed:  See the new instructions.   PROGRAF 0.5 MG capsule Generic drug:  tacrolimus Take 0.5-1 mg by mouth See admin instructions. Take 1 mg by mouth in the morning and take 0.5 mg by mouth at bedtime   rosuvastatin 20 MG tablet Commonly known as:  CRESTOR Take 1 tablet (20 mg total) by mouth daily.   sevelamer carbonate 800 MG tablet Commonly known as:  RENVELA Take 800 mg by mouth See admin instructions. 4 tablets with meals and 2 tablets with snacks.      Allergies  Allergen Reactions  . Other Other (See Comments)    Tape Bruises and tears skin, Paper tape tolerated  . Tape Other (See Comments)    Bruises and tears skin Paper tape tolerated   Follow-up Information    Sharion Balloon, FNP. Schedule an appointment as soon as possible for a visit in 10 day(s).   Specialty:  Family Medicine Contact information: [place] [address] [phone]            The results of significant diagnostics from this hospitalization (including imaging, microbiology, ancillary and laboratory) are listed below for reference.    Significant Diagnostic Studies: US Renal Transplant W/doppler  Result Date: 03/12/2018 CLINICAL DATA:  Renal transplant, hydronephrosis EXAM: ULTRASOUND OF RENAL TRANSPLANT WITH RENAL DOPPLER ULTRASOUND TECHNIQUE: Ultrasound examination of the renal transplant was performed with gray-scale, color and duplex doppler evaluation. COMPARISON:  CT abdomen and pelvis 03/11/2018 FINDINGS: Native kidneys: RIGHT KIDNEY: 7.8 x 3.4 x 4.1 cm with volume 56.1 mL. Renal cortical atrophy and increased cortical echogenicity. No gross mass or hydronephrosis. Nonshadowing echogenic foci of renal hilum without calculi by prior CT. LEFT KIDNEY: 9.0 x 3.7 x 4.1 cm with volume 70.8 mL. Cortical thinning and increased cortical echogenicity. No mass or hydronephrosis. Nonshadowing echogenic foci at renal hilum, without calculi by prior CT. Transplant kidney location: RIGHT iliac fossa Transplant Kidney: Renal measurements: 12.8 x 6.3 x 6.5 cm = volume: 270.37mL. Normal cortical thickness and parenchymal echogenicity. No mass or shadowing calcification. Moderate hydronephrosis present. Color flow in the main renal artery:  Present Color flow in the main renal vein:  Present Duplex Doppler Evaluation: Main Renal Artery Resistive Index: 0.95 Venous waveform in main renal vein:  Present Intrarenal resistive index in upper pole:  1.0 (normal 0.6-0.8; equivocal 0.8-0.9; abnormal >= 0.9) Intrarenal resistive index in lower pole: 1.0 (normal 0.6-0.8; equivocal 0.8-0.9; abnormal >= 0.9) Bladder: Normal for degree of bladder distention. Ureteral jets were not visualized. Other  findings:  None. IMPRESSION: Moderate transplant hydronephrosis. Elevated resistive indices consistent with the visualized moderate degree of hydronephrosis, though this could also be seen in patients with acute tubular necrosis, transplant rejection, and with drug toxicity. Electronically Signed   By: Lavonia Dana M.D.   On: 03/12/2018 15:19   Ct Abdomen Pelvis W Contrast  Result Date: 03/11/2018 CLINICAL DATA:  Right-sided abdominal pain with vomiting for 10 days EXAM: CT ABDOMEN AND PELVIS WITH CONTRAST TECHNIQUE: Multidetector CT imaging of the abdomen and pelvis was performed using the standard protocol following bolus administration of intravenous contrast. CONTRAST:  153mL ISOVUE-300 IOPAMIDOL (ISOVUE-300) INJECTION 61% COMPARISON:  05/30/2017 FINDINGS: Lower chest: Diffuse coronary atherosclerotic calcification. No acute finding Hepatobiliary: No focal liver abnormality.Cholecystectomy. Normal common bile duct diameter. Pancreas: Unremarkable. Spleen: Unremarkable. Adrenals/Urinary Tract: Negative adrenals. End-stage renal disease with atrophic native kidneys that are nonenhancing. A transplant kidney in the right lower quadrant is normal size and enhancing but reportedly nonfunctional. There is transplant moderate hydronephrosis that is new from prior. The urothelium at the  level of the ureter appears thickened, although there is no notable straining at the renal pelvis. Mild perinephric stranding that is stable. No abscess is seen. Small volume urinary bladder. Stomach/Bowel: Formed stool throughout the descending colon, sigmoid, and rectum. No bowel obstruction. No appendicitis. Vascular/Lymphatic: Diffuse atherosclerotic calcification without branch occlusion or other acute finding. No mass or adenopathy. Reproductive:No pathologic findings. Other: No pneumoperitoneum.  Trace pelvic ascites Musculoskeletal: No acute abnormalities. Remote T11 inferior endplate fracture. L5-S1 degenerative disc  narrowing. IMPRESSION: 1. New hydronephrosis of the right lower quadrant transplant kidney with mild urothelial thickening of the transplant ureter, possible infection in this setting. 2. Formed stool throughout the left colon, please correlate for constipation. Electronically Signed   By: Monte Fantasia M.D.   On: 03/11/2018 04:26    Microbiology: Recent Results (from the past 240 hour(s))  Blood culture (routine x 2)     Status: None (Preliminary result)   Collection Time: 03/11/18  5:21 AM  Result Value Ref Range Status   Specimen Description BLOOD RIGHT ARM  Final   Special Requests   Final    BOTTLES DRAWN AEROBIC AND ANAEROBIC Blood Culture adequate volume   Culture   Final    NO GROWTH 2 DAYS Performed at [facility], [address], [place], [place] [address]    Report Status PENDING  Incomplete  Blood culture (routine x 2)     Status: None (Preliminary result)   Collection Time: 03/11/18  5:28 AM  Result Value Ref Range Status   Specimen Description BLOOD RIGHT HAND  Final   Special Requests   Final    BOTTLES DRAWN AEROBIC AND ANAEROBIC Blood Culture adequate volume   Culture   Final    NO GROWTH 2 DAYS Performed at [facility], [address], [place], [place] [address]    Report Status PENDING  Incomplete  Urine culture     Status: None   Collection Time: 03/11/18  8:44 AM  Result Value Ref Range Status   Specimen Description   Final    URINE, CLEAN CATCH Performed at [facility], [address], [place], [place] [address]    Special Requests   Final    NONE Performed at [facility], [address] [place], [place] [address]    Culture   Final    NO GROWTH Performed at [facility], [place] [address] [place], [place] [address]    Report Status 03/12/2018 FINAL  Final     Labs: Basic Metabolic Panel: Recent Labs  Lab 03/11/18 0240 03/12/18 0543 03/13/18 0634  NA 138 135 137  K 4.5 5.1 4.0  CL 103 98 99  CO2 24 25 28   GLUCOSE 270* 89 86  BUN  42* 50* 23  CREATININE 9.30* 11.27* 6.59*  CALCIUM 8.5* 8.3* 8.4*   Liver Function Tests: Recent Labs  Lab 03/11/18 0240 03/12/18 0543 03/13/18 0634  AST 80* 28 28  ALT 48* 27 24  ALKPHOS 118 82 82  BILITOT 0.5 0.7 0.9  PROT 7.0 6.2* 6.4*  ALBUMIN 3.3* 2.8* 2.8*   Recent Labs  Lab 03/11/18 0240  LIPASE 54*   CBC: Recent Labs  Lab 03/11/18 0240 03/12/18 0543 03/13/18 0634  WBC 7.7 5.1 4.6  NEUTROABS 6.1 3.7 2.9  HGB 10.7* 9.0* 9.9*  HCT 34.3* 29.3* 31.8*  MCV 95.3 97.7 95.2  PLT 131* 122* 126*   CBG: Recent Labs  Lab 03/12/18 1710 03/12/18 2153 03/13/18 0735 03/13/18 1132 03/13/18 1631  GLUCAP 84 135* 83 100* 125*    Signed:  Barton Dubois MD.  [facility] 03/13/2018, 4:41 PM

## 2018-03-13 NOTE — Progress Notes (Addendum)
[name] Progress Note   Assessment/ Plan:    Dialysis orders: [facility] MWF 3 hrs 30 min EDW 67.0 kg Revaclear 300 2K/2.5 Ca bath, BFR 300, 16 g needle No heparin hectorol 4 mcg TIW venofer 50 mg q week  1 Transplant pyelo- getting IV vanc and zosyn.  Urine culture negative, blood cultures neg.  Transplant [place] still with some hydro- but not getting worse fortunately, no stones.  Repeat UA today to see if blood and WBCs in urine has improved after ~ 48 hrs abx.  Since she's clinically improved my suspicion for transplant rejection is lower so advise continuing on current course and de-escalating antibiotics when determined appropriate- could possibly even do later today if pt continues to improve.  Would repeat US after finishing antibiotics to ensure hydro resolved.  2 ESRD: HD back on schedule today- normally MWF  3 Hypertension: not an issue at this time  4. Anemia of ESRD: no ESA, gets venofer  5. Metabolic Bone Disease: renvela as binder, will liberalize diet per pt's request to renal with fluid restriction.  6.  S/p failed renal transplant- prednisone 5 mg and tac 1 q AM and 0.5 q PM.   7.  Dispo:  Pending switch to PO antibiotics  ADDENDUM 03/14/2018: Although pt confirmed prograf dosing with me previously yesterday, per nephrology RN staff pt reported to her that she had been out of the medication for about 2 months.  Will ask her primary nephrologist to confirm with her.  Generally immunosuppression is tapered anyway after a failed transplant- so she has only been taking prednisone 5 mg daily.  May not be necessary to restart it.  Please see RN documentation re: BP and post-HD weight.  Would not challenge pt's EDW in OP setting given orthostasis at end of rx last night.   Subjective:    Clinically improved.  Feeling better.  Would like to try a regular diet.     Objective:   BP (!) 155/53   Pulse (!) 56   Temp 97.8 F (36.6 C) (Oral)   Resp 16   Ht  5\' 9"  (1.753 m)   Wt 67 kg   SpO2 97%   BMI 21.81 kg/m   Physical Exam: GEN: NAD, lying in bed, arousable HEENT EOMI PERRL MMM NECK no JVD PULM clear bilaterally no c/w/r CV RRR soft systolic murmur no r/g ABD soft, allograft in RLQ, nontender  EXT no LE edema NEURO AAO x 3 nonfocal ACCESS: L FA AVF + T/B  Labs: BMET Recent Labs  Lab 03/11/18 0240 03/12/18 0543 03/13/18 0634  NA 138 135 137  K 4.5 5.1 4.0  CL 103 98 99  CO2 24 25 28   GLUCOSE 270* 89 86  BUN 42* 50* 23  CREATININE 9.30* 11.27* 6.59*  CALCIUM 8.5* 8.3* 8.4*   CBC Recent Labs  Lab 03/11/18 0240 03/12/18 0543 03/13/18 0634  WBC 7.7 5.1 4.6  NEUTROABS 6.1 3.7 2.9  HGB 10.7* 9.0* 9.9*  HCT 34.3* 29.3* 31.8*  MCV 95.3 97.7 95.2  PLT 131* 122* 126*    @IMGRELPRIORS @ Medications:    . amLODipine  5 mg Oral Daily  . aspirin EC  81 mg Oral Daily  . Chlorhexidine Gluconate Cloth  6 each Topical Q0600  . doxercalciferol  4 mcg Intravenous Q M,W,F-HD  . enoxaparin (LOVENOX) injection  30 mg Subcutaneous Q24H  . insulin aspart  0-5 Units Subcutaneous QHS  . insulin aspart  0-9 Units Subcutaneous TID  WC  . insulin glargine  8 Units Subcutaneous Q2200  . isosorbide mononitrate  60 mg Oral Daily  . metoprolol tartrate  12.5 mg Oral BID  . predniSONE  5 mg Oral Q breakfast  . sevelamer carbonate  3,200 mg Oral TID WC  . tacrolimus  0.5 mg Oral Q2200  . tacrolimus  1 mg Oral q morning - 10a     Madelon Lips, MD [facility] pgr [phone] 03/13/2018, 9:14 AM

## 2018-03-14 NOTE — Procedures (Signed)
    HEMODIALYSIS NURSING NOTE -- late entry for 03/13/18 @ 2030  Primary RN reviewed discharge instructions and patient indicated she needed Rx for Prograf.  She indicated she has had difficulty obtaining return call / refill from transplant center in [facility] and that she has not taken Prograf for two months prior to this admission.  Will discuss with Dr. Hollie Salk in the morning.  [name] husband also stated that there were no new prescriptions called to their pharmacy ([facility] in [place]) and that she did not have a prescription for Ceftin.  Will discuss with Dr. Dyann Kief on 03/14/18 and f/u with patient.  [name], RN

## 2018-03-15 DIAGNOSIS — Z992 Dependence on renal dialysis: Secondary | ICD-10-CM | POA: Diagnosis not present

## 2018-03-15 DIAGNOSIS — N186 End stage renal disease: Secondary | ICD-10-CM | POA: Diagnosis not present

## 2018-03-16 LAB — CULTURE, BLOOD (ROUTINE X 2)
CULTURE: NO GROWTH
Culture: NO GROWTH
SPECIAL REQUESTS: ADEQUATE
Special Requests: ADEQUATE

## 2018-03-18 DIAGNOSIS — N186 End stage renal disease: Secondary | ICD-10-CM | POA: Diagnosis not present

## 2018-03-18 DIAGNOSIS — Z992 Dependence on renal dialysis: Secondary | ICD-10-CM | POA: Diagnosis not present

## 2018-03-20 DIAGNOSIS — N186 End stage renal disease: Secondary | ICD-10-CM | POA: Diagnosis not present

## 2018-03-20 DIAGNOSIS — Z992 Dependence on renal dialysis: Secondary | ICD-10-CM | POA: Diagnosis not present

## 2018-03-22 ENCOUNTER — Other Ambulatory Visit: Payer: Self-pay | Admitting: Family

## 2018-03-22 DIAGNOSIS — Z992 Dependence on renal dialysis: Secondary | ICD-10-CM | POA: Diagnosis not present

## 2018-03-22 DIAGNOSIS — Z794 Long term (current) use of insulin: Principal | ICD-10-CM

## 2018-03-22 DIAGNOSIS — N186 End stage renal disease: Secondary | ICD-10-CM | POA: Diagnosis not present

## 2018-03-22 DIAGNOSIS — E1165 Type 2 diabetes mellitus with hyperglycemia: Secondary | ICD-10-CM

## 2018-03-25 DIAGNOSIS — N186 End stage renal disease: Secondary | ICD-10-CM | POA: Diagnosis not present

## 2018-03-25 DIAGNOSIS — Z992 Dependence on renal dialysis: Secondary | ICD-10-CM | POA: Diagnosis not present

## 2018-03-27 DIAGNOSIS — Z992 Dependence on renal dialysis: Secondary | ICD-10-CM | POA: Diagnosis not present

## 2018-03-27 DIAGNOSIS — N186 End stage renal disease: Secondary | ICD-10-CM | POA: Diagnosis not present

## 2018-03-29 DIAGNOSIS — N186 End stage renal disease: Secondary | ICD-10-CM | POA: Diagnosis not present

## 2018-03-29 DIAGNOSIS — Z992 Dependence on renal dialysis: Secondary | ICD-10-CM | POA: Diagnosis not present

## 2018-04-01 ENCOUNTER — Encounter (HOSPITAL_COMMUNITY): Payer: Self-pay | Admitting: Emergency Medicine

## 2018-04-01 ENCOUNTER — Emergency Department (HOSPITAL_COMMUNITY)
Admission: EM | Admit: 2018-04-01 | Discharge: 2018-04-01 | Disposition: A | Discharge status: Home / Self Care | Payer: BLUE CROSS/BLUE SHIELD | Attending: Emergency Medicine | Admitting: Emergency Medicine

## 2018-04-01 ENCOUNTER — Other Ambulatory Visit: Payer: Self-pay

## 2018-04-01 DIAGNOSIS — N390 Urinary tract infection, site not specified: Secondary | ICD-10-CM | POA: Diagnosis not present

## 2018-04-01 DIAGNOSIS — E119 Type 2 diabetes mellitus without complications: Secondary | ICD-10-CM | POA: Diagnosis not present

## 2018-04-01 DIAGNOSIS — I132 Hypertensive heart and chronic kidney disease with heart failure and with stage 5 chronic kidney disease, or end stage renal disease: Secondary | ICD-10-CM | POA: Insufficient documentation

## 2018-04-01 DIAGNOSIS — Z7982 Long term (current) use of aspirin: Secondary | ICD-10-CM | POA: Diagnosis not present

## 2018-04-01 DIAGNOSIS — N186 End stage renal disease: Secondary | ICD-10-CM | POA: Diagnosis not present

## 2018-04-01 DIAGNOSIS — R319 Hematuria, unspecified: Secondary | ICD-10-CM

## 2018-04-01 DIAGNOSIS — Z79899 Other long term (current) drug therapy: Secondary | ICD-10-CM | POA: Insufficient documentation

## 2018-04-01 DIAGNOSIS — I5032 Chronic diastolic (congestive) heart failure: Secondary | ICD-10-CM | POA: Diagnosis not present

## 2018-04-01 DIAGNOSIS — N3001 Acute cystitis with hematuria: Secondary | ICD-10-CM | POA: Insufficient documentation

## 2018-04-01 DIAGNOSIS — Z992 Dependence on renal dialysis: Secondary | ICD-10-CM | POA: Diagnosis not present

## 2018-04-01 DIAGNOSIS — R309 Painful micturition, unspecified: Secondary | ICD-10-CM | POA: Diagnosis not present

## 2018-04-01 LAB — URINALYSIS, ROUTINE W REFLEX MICROSCOPIC
Bilirubin Urine: NEGATIVE
GLUCOSE, UA: 50 mg/dL — AB
Ketones, ur: NEGATIVE mg/dL
Nitrite: NEGATIVE
PH: 5 (ref 5.0–8.0)
Protein, ur: 100 mg/dL — AB
RBC / HPF: >50 RBC/hpf — ABNORMAL HIGH (ref 0–5)
Specific Gravity, Urine: 1.015 (ref 1.005–1.030)
WBC, UA: >50 WBC/hpf — ABNORMAL HIGH (ref 0–5)

## 2018-04-01 MED ORDER — ONDANSETRON 8 MG PO TBDP
8.0000 mg | ORAL_TABLET | Freq: Once | ORAL | Status: AC
  Administered 2018-04-01: 8 mg via ORAL
  Filled 2018-04-01: qty 1

## 2018-04-01 MED ORDER — ONDANSETRON HCL 4 MG PO TABS: 4.0000 mg | ORAL_TABLET | Freq: Three times a day (TID) | ORAL | 0 refills | Status: DC | PRN

## 2018-04-01 NOTE — Discharge Instructions (Addendum)
Please have your dialysis nurse contact your dialysis doctor today to get you started on Rocephin for your urinary tract infection.  Cultures were done today and should be available in 2 days.  Use the Zofran if needed for nausea.  Please return to the ED if you get fever or have uncontrolled vomiting.

## 2018-04-01 NOTE — ED Triage Notes (Signed)
Pt with c/o nausea and burning with urination that started yesterday. Pt is a dialysis pt and "makes very little urine" but feels as if she has to go often.

## 2018-04-01 NOTE — ED Provider Notes (Signed)
[facility] EMERGENCY DEPARTMENT Provider Note   CSN: [id number] Arrival date & time: 04/01/18  0403  Time seen 4:25 AM   History   Chief Complaint Chief Complaint  Patient presents with  . ? UTI    HPI [name] is a 63 y.o. female.  HPI patient reports yesterday he started having some burning when she urinated.  She states increased after 2 PM.  She had 3-4 episodes where it burns when she urinated.  She states she normally only urinates once or twice a day.  About 10 PM she started having the urge to urinate however she did not pass any urine.  She denies any fever.  She does complain of some lower abdominal pain.  She denies having any urinary tract infections in the past.  She states on the way driving to the ED she started having nausea.  She has not had vomiting.  Patient then tells me she was recently admitted and when I look at her chart she was admitted from January 6 through the eighth with pyelonephritis and her donated kidney.  The kidney has been nonfunctional since 2018 and patient is on dialysis on Monday, Wednesday, and Friday at [facility] in [place].  She is due for dialysis this morning at 715.  She states the pain is similar to what she had in the hospital before.  She states the dysuria is a new symptom she did not have before.  When I review her chart she presented with fever of over 102.  PCP Sharion Balloon, FNP   Past Medical History:  Diagnosis Date  . Anemia    of chronic disease  . Blood transfusion without reported diagnosis   . Cardiovascular disease    nonobstructive  . Carotid artery stenosis 2008  . CHF (congestive heart failure) ([place])   . Coronary artery disease   . Diabetes mellitus   . Dyslipidemia   . Edema of lower extremity    with hypo-albuminemia and profound protenuria  . Heart murmur   . Hypertension   . Mitral regurgitation   . Nephrotic syndrome   . Patent foramen ovale   . Pulmonary hypertension, moderate to severe ([place])   .  Pulmonary nodule   . Tricuspid regurgitation   . Volume depletion, renal, due to output loss (renal deficit)     Patient Active Problem List   Diagnosis Date Noted  . Hydronephrosis   . Acute pyelonephritis 03/11/2018  . Abnormal LFTs (liver function tests) 03/11/2018  . Type 2 diabetes mellitus ([place]) 03/11/2018  . Valvular heart disease 03/11/2018  . Chronic kidney disease on chronic dialysis ([place]) 11/13/2017  . Nausea 09/21/2017  . Leukocytosis 09/21/2017  . Generalized weakness 09/21/2017  . Nausea & vomiting 05/30/2017  . Hyperlipidemia associated with type 2 diabetes mellitus ([place]) 02/22/2017  . ESRD (end stage renal disease) ([place]) August 17, 2016  . 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 Encounter for aftercare following kidney transplant 05/22/2016  . Chronic diastolic heart failure ([place]) 06/08/2015  . Aortic valve sclerosis 06/08/2015  . Anemia of chronic disease 07/03/2013  . Hypertension associated with diabetes ([place]) 12/18/2010  . Diabetes mellitus ([place]) 12/18/2010    Past Surgical History:  Procedure Laterality Date  . A/V FISTULAGRAM N/A 04/05/2017   Procedure: A/V FISTULAGRAM - Left Arm;  Surgeon: Angelia Mould, MD;  Location: [place] [facility];  Service: Cardiovascular;  Laterality: N/A;  . A/V FISTULAGRAM Left 09/18/2017   Procedure: A/V FISTULAGRAM;  Surgeon: Trula Slade,  Butch Penny, MD;  Location: [place] [facility];  Service: Cardiovascular;  Laterality: Left;  . A/V SHUNT INTERVENTION Left 04/05/2017   Procedure: A/V SHUNT INTERVENTION;  Surgeon: Angelia Mould, MD;  Location: [place] [facility];  Service: Cardiovascular;  Laterality: Left;  . CARDIAC CATHETERIZATION  2008  . IR REMOVAL TUN CV CATH W/O FL  11/02/2016  . 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 PERIPHERAL VASCULAR BALLOON ANGIOPLASTY Left 09/18/2017   Procedure: PERIPHERAL VASCULAR BALLOON ANGIOPLASTY;  Surgeon: Serafina Mitchell, MD;  Location: [place] [facility];  Service:  Cardiovascular;  Laterality: Left;  Arm Fistula     OB History   No obstetric history on file.      Home Medications    Prior to Admission medications   Medication Sig Start Date End Date Taking? Authorizing Provider  acetaminophen (TYLENOL) 500 MG tablet Take 500 mg by mouth every 6 (six) hours as needed for moderate pain or headache.  04/06/16  Yes [provider]  amLODipine (NORVASC) 5 MG tablet TAKE 5 MG BY MOUTH NIGHTLY Patient taking differently: Take 5 mg by mouth daily. TAKE 5 MG BY MOUTH NIGHTLY 10/24/17  Yes Eustaquio Maize, MD  aspirin (ASPIR-LOW) 81 MG EC tablet Take 81 mg by mouth daily.    Yes [provider]  insulin degludec (TRESIBA FLEXTOUCH) 100 UNIT/ML SOPN FlexTouch Pen Inject 0.08 mLs (8 Units total) into the skin daily at 10 pm. 09/25/17  Yes Evelina Dun A, FNP  isosorbide mononitrate (IMDUR) 60 MG 24 hr tablet TAKE 1 TABLET BY MOUTH ONCE DAILY 10/29/17  Yes Hawks, Christy A, FNP  lidocaine-prilocaine (EMLA) cream Apply 1 application topically as needed (for dialysis).  09/08/16  Yes [provider]  metoprolol tartrate (LOPRESSOR) 25 MG tablet Take 0.5 tablets (12.5 mg total) by mouth 2 (two) times daily. 09/25/17  Yes Hawks, Christy A, FNP  ondansetron (ZOFRAN ODT) 4 MG disintegrating tablet 4mg  ODT q4 hours prn nausea/vomit Patient taking differently: Take 4 mg by mouth 2 (two) times daily. 4mg  ODT q4 hours prn nausea/vomit 11/26/17  Yes Milton [place], MD  predniSONE (DELTASONE) 5 MG tablet TAKE 1 TABLET BY MOUTH ONCE DAILY WITH BREAKFAST Patient taking differently: Take 5 mg by mouth daily.  12/31/17  Yes Hawks, Christy A, FNP  PROGRAF 0.5 MG capsule Take 0.5-1 mg by mouth See admin instructions. Take 1 mg by mouth in the morning and take 0.5 mg by mouth at bedtime 10/24/16  Yes [provider]  rosuvastatin (CRESTOR) 20 MG tablet Take 1 tablet (20 mg total) by mouth daily. 09/25/17  Yes Hawks, Christy A, FNP  sevelamer carbonate  (RENVELA) 800 MG tablet Take 800 mg by mouth See admin instructions. 4 tablets with meals and 2 tablets with snacks. 10/02/16  Yes [provider]  TRULICITY 1.5 LY/6.5KP SOPN INJECT 1.5 MG INTO THE SKIN ONCE A WEEK 03/23/18  Yes Hawks, Christy A, FNP  ondansetron (ZOFRAN) 4 MG tablet Take 1 tablet (4 mg total) by mouth every 8 (eight) hours as needed. 04/01/18   Rolland Porter, MD    Family History Family History  Problem Relation Age of Onset  . Kidney disease Mother   . Diabetes Mother   . Diabetes Father   . Heart disease Father     Social History Social History   Tobacco Use  . Smoking status: Never Smoker  . Smokeless tobacco: Never Used  Substance Use Topics  . Alcohol use: No  . Drug  use: No     Allergies   Other and Tape   Review of Systems Review of Systems  All other systems reviewed and are negative.    Physical Exam Updated Vital Signs BP (!) 169/71   Pulse (!) 59   Temp 97.8 F (36.6 C) (Oral)   Resp 18   Ht 5\' 9"  (1.753 m)   Wt 65.8 kg   SpO2 96%   BMI 21.41 kg/m   Vital signs normal except for hypertension and bradycardia   Physical Exam Vitals signs and nursing note reviewed.  Constitutional:      General: She is not in acute distress.    Appearance: Normal appearance. She is well-developed. She is not ill-appearing or toxic-appearing.  HENT:     Head: Normocephalic and atraumatic.     Right Ear: External ear normal.     Left Ear: External ear normal.     Nose: Nose normal. No mucosal edema or rhinorrhea.     Mouth/Throat:     Mouth: Mucous membranes are dry.     Dentition: No dental abscesses.     Pharynx: No uvula swelling.  Eyes:     Conjunctiva/sclera: Conjunctivae normal.     Pupils: Pupils are equal, round, and reactive to light.  Neck:     Musculoskeletal: Full passive range of motion without pain, normal range of motion and neck supple.  Cardiovascular:     Rate and Rhythm: Normal rate and regular rhythm.     Heart  sounds: Murmur present. Systolic murmur present. No friction rub. No gallop.   Pulmonary:     Effort: Pulmonary effort is normal. No respiratory distress.     Breath sounds: Normal breath sounds. No wheezing, rhonchi or rales.  Chest:     Chest wall: No tenderness or crepitus.  Abdominal:     General: Bowel sounds are normal. There is no distension.     Palpations: Abdomen is soft.     Tenderness: There is abdominal tenderness in the right lower quadrant. There is no guarding or rebound.     Comments: Patient has tenderness and fullness in the right lower quadrant, she states that is where her renal transplant is located.  Musculoskeletal: Normal range of motion.        General: No swelling or tenderness.     Comments: Moves all extremities well.  Patient has a good bruit and thrill over her dialysis access in her left forearm.  Skin:    General: Skin is warm and dry.     Coloration: Skin is pale.     Findings: No erythema or rash.  Neurological:     Mental Status: She is alert and oriented to person, place, and time.     Cranial Nerves: No cranial nerve deficit.  Psychiatric:        Mood and Affect: Mood is not anxious.        Speech: Speech normal.        Behavior: Behavior normal.      ED Treatments / Results  Labs (all labs ordered are listed, but only abnormal results are displayed) Results for orders placed or performed during the hospital encounter of 04/01/18  Urinalysis, Routine w reflex microscopic  Result Value Ref Range   Color, Urine AMBER (A) YELLOW   APPearance TURBID (A) CLEAR   Specific Gravity, Urine 1.015 1.005 - 1.030   pH 5.0 5.0 - 8.0   Glucose, UA 50 (A) NEGATIVE mg/dL   Hgb urine  dipstick LARGE (A) NEGATIVE   Bilirubin Urine NEGATIVE NEGATIVE   Ketones, ur NEGATIVE NEGATIVE mg/dL   Protein, ur 100 (A) NEGATIVE mg/dL   Nitrite NEGATIVE NEGATIVE   Leukocytes, UA LARGE (A) NEGATIVE   RBC / HPF >50 (H) 0 - 5 RBC/hpf   WBC, UA >50 (H) 0 - 5 WBC/hpf    Bacteria, UA FEW (A) NONE SEEN   Squamous Epithelial / LPF 0-5 0 - 5   WBC Clumps PRESENT    Budding Yeast PRESENT    Hyaline Casts, UA PRESENT     EKG None  Radiology No results found.    US Renal Transplant W/doppler  Result Date: 03/12/2018 CLINICAL DATA:  Renal transplant, hydronephrosis  IMPRESSION: Moderate transplant hydronephrosis. Elevated resistive indices consistent with the visualized moderate degree of hydronephrosis, though this could also be seen in patients with acute tubular necrosis, transplant rejection, and with drug toxicity. Electronically Signed   By: Lavonia Dana M.D.   On: 03/12/2018 15:19   Ct Abdomen Pelvis W Contrast  Result Date: 03/11/2018 CLINICAL DATA:  Right-sided abdominal pain with vomiting for 10 days . IMPRESSION: 1. New hydronephrosis of the right lower quadrant transplant kidney with mild urothelial thickening of the transplant ureter, possible infection in this setting. 2. Formed stool throughout the left colon, please correlate for constipation. Electronically Signed   By: Monte Fantasia M.D.   On: 03/11/2018 04:26     Procedures Procedures (including critical care time)  Medications Ordered in ED Medications  ondansetron (ZOFRAN-ODT) disintegrating tablet 8 mg (8 mg Oral Given 04/01/18 0447)     Initial Impression / Assessment and Plan / ED Course  I have reviewed the triage vital signs and the nursing notes.  Pertinent labs & imaging results that were available during my care of the patient were reviewed by me and considered in my medical decision making (see chart for details).     Patient was given Zofran ODT for her nausea and cath urine was ordered.  When I review her records she was seen by nephrology while in the hospital.  At that point patient did relates she had run out of her Prograf for about 2 months and the nephrologist was questioning whether they needed to restart it again or not.  It appears however they did restart it,  husband states she is currently on it.  She also is on prednisone 5 mg a day.  She is followed by the transplant team at [facility].  When I look through her culture results there was no urine culture done the last visit because she did not have any urine in her bladder, the last urine culture I can find is March 2019 which had less than 10,000 colonies.  Patient was discharged on Ceftin when she left the hospital on January 8.  5:30 AM I let patient and her husband know her urine did show she still had a infection.  I am going to talk to the nephrologist to see if they want to give her antibiotics during dialysis or if you think she should take oral pills.  06:05 AM Dr Jonnie Finner, nephrology, states he only covers patients in the hospital, not [facility] and can't advise on what to do.   Patient is leaving the ED and going straight to her dialysis.  If I give her something orally or even IM here it will be removed by dialysis.  I am going to recommend they contact her dialysis doctor in her discharge  papers so she can get her antibiotics during her dialysis which will make things easier and make sure she gets the antibiotics, she was instructed however if she gets vomiting or fever she should be reevaluated quickly.  Final Clinical Impressions(s) / ED Diagnoses   Final diagnoses:  Urinary tract infection with hematuria, site unspecified    ED Discharge Orders         Ordered    ondansetron (ZOFRAN) 4 MG tablet  Every 8 hours PRN     04/01/18 0609          Plan discharge  Rolland Porter, MD, Barbette Or, MD 04/01/18 [phone]

## 2018-04-03 DIAGNOSIS — Z992 Dependence on renal dialysis: Secondary | ICD-10-CM | POA: Diagnosis not present

## 2018-04-03 DIAGNOSIS — N186 End stage renal disease: Secondary | ICD-10-CM | POA: Diagnosis not present

## 2018-04-03 LAB — URINE CULTURE: Culture: 40000 — AB

## 2018-04-04 ENCOUNTER — Telehealth: Payer: Self-pay | Admitting: *Deleted

## 2018-04-04 NOTE — Telephone Encounter (Addendum)
Spoke with patient in follow up to 04/01/2018 ER visit.  States she was started on abx therapy by Dr. Birdie Sons. Urine culture results faxed to Theressa Stamps [phone]

## 2018-04-04 NOTE — Progress Notes (Signed)
ED Antimicrobial Stewardship Positive Culture Follow Up   [name] is an 63 y.o. female who presented to [facility] on 04/01/2018 with a chief complaint of  Chief Complaint  Patient presents with  . ? UTI    Recent Results (from the past 720 hour(s))  Blood culture (routine x 2)     Status: None   Collection Time: 03/11/18  5:21 AM  Result Value Ref Range Status   Specimen Description BLOOD RIGHT ARM  Final   Special Requests   Final    BOTTLES DRAWN AEROBIC AND ANAEROBIC Blood Culture adequate volume   Culture   Final    NO GROWTH 5 DAYS Performed at [facility], [address] [place], [place] [address]    Report Status 03/16/2018 FINAL  Final  Blood culture (routine x 2)     Status: None   Collection Time: 03/11/18  5:28 AM  Result Value Ref Range Status   Specimen Description BLOOD RIGHT HAND  Final   Special Requests   Final    BOTTLES DRAWN AEROBIC AND ANAEROBIC Blood Culture adequate volume   Culture   Final    NO GROWTH 5 DAYS Performed at [facility], [address], [place], [place] [address]    Report Status 03/16/2018 FINAL  Final  Urine culture     Status: None   Collection Time: 03/11/18  8:44 AM  Result Value Ref Range Status   Specimen Description   Final    URINE, CLEAN CATCH Performed at [facility], [address] [place], [place] [address]    Special Requests   Final    NONE Performed at [facility], [address] [place], [place] [address]    Culture   Final    NO GROWTH Performed at [facility], [place] [address] [place], [place] [address]    Report Status 03/12/2018 FINAL  Final  Urine culture     Status: Abnormal   Collection Time: 04/01/18  4:47 AM  Result Value Ref Range Status   Specimen Description   Final    URINE, CATHETERIZED Performed at [facility], [address] [place], [place] [address]    Special Requests   Final    NONE Performed at [facility], [address], [place], [place] [address]    Culture 40,000  COLONIES/mL SERRATIA MARCESCENS (A)  Final   Report Status 04/03/2018 FINAL  Final   Organism ID, Bacteria SERRATIA MARCESCENS (A)  Final      Susceptibility   Serratia marcescens - MIC*    CEFAZOLIN >=64 RESISTANT Resistant     CEFTRIAXONE <=1 SENSITIVE Sensitive     CIPROFLOXACIN <=0.25 SENSITIVE Sensitive     GENTAMICIN <=1 SENSITIVE Sensitive     NITROFURANTOIN 256 RESISTANT Resistant     TRIMETH/SULFA <=20 SENSITIVE Sensitive     * 40,000 COLONIES/mL SERRATIA MARCESCENS    []  Treated with N/A, organism resistant to prescribed antimicrobial [x]  Patient discharged originally without antimicrobial agent and treatment is now indicated  New antibiotic prescription: Pt instructed to tell her dialysis center to treat her for UTI. Will ask pt if she was started on antibiotics by nephrologist. If so, will forward culture results to them. If not, she should start cefdinir 300mg  every other day after HD x 5 days  ED Provider: Ocie Cornfield, PA   Loucille Takach, Rande Lawman 04/04/2018, 8:19 AM Clinical Pharmacist Monday - Friday phone -  [phone] Saturday - Sunday phone - [phone]

## 2018-04-05 DIAGNOSIS — N186 End stage renal disease: Secondary | ICD-10-CM | POA: Diagnosis not present

## 2018-04-05 DIAGNOSIS — Z992 Dependence on renal dialysis: Secondary | ICD-10-CM | POA: Diagnosis not present

## 2018-04-08 DIAGNOSIS — N186 End stage renal disease: Secondary | ICD-10-CM | POA: Diagnosis not present

## 2018-04-08 DIAGNOSIS — Z992 Dependence on renal dialysis: Secondary | ICD-10-CM | POA: Diagnosis not present

## 2018-04-10 DIAGNOSIS — Z992 Dependence on renal dialysis: Secondary | ICD-10-CM | POA: Diagnosis not present

## 2018-04-10 DIAGNOSIS — N186 End stage renal disease: Secondary | ICD-10-CM | POA: Diagnosis not present

## 2018-04-12 DIAGNOSIS — Z992 Dependence on renal dialysis: Secondary | ICD-10-CM | POA: Diagnosis not present

## 2018-04-12 DIAGNOSIS — N186 End stage renal disease: Secondary | ICD-10-CM | POA: Diagnosis not present

## 2018-04-14 ENCOUNTER — Other Ambulatory Visit: Payer: Self-pay | Admitting: Family

## 2018-04-14 DIAGNOSIS — Z794 Long term (current) use of insulin: Principal | ICD-10-CM

## 2018-04-14 DIAGNOSIS — E1165 Type 2 diabetes mellitus with hyperglycemia: Secondary | ICD-10-CM

## 2018-04-15 DIAGNOSIS — N186 End stage renal disease: Secondary | ICD-10-CM | POA: Diagnosis not present

## 2018-04-15 DIAGNOSIS — Z992 Dependence on renal dialysis: Secondary | ICD-10-CM | POA: Diagnosis not present

## 2018-04-17 DIAGNOSIS — Z992 Dependence on renal dialysis: Secondary | ICD-10-CM | POA: Diagnosis not present

## 2018-04-17 DIAGNOSIS — N186 End stage renal disease: Secondary | ICD-10-CM | POA: Diagnosis not present

## 2018-04-19 DIAGNOSIS — N186 End stage renal disease: Secondary | ICD-10-CM | POA: Diagnosis not present

## 2018-04-19 DIAGNOSIS — Z992 Dependence on renal dialysis: Secondary | ICD-10-CM | POA: Diagnosis not present

## 2018-04-21 ENCOUNTER — Other Ambulatory Visit: Payer: Self-pay | Admitting: Family

## 2018-04-22 DIAGNOSIS — N186 End stage renal disease: Secondary | ICD-10-CM | POA: Diagnosis not present

## 2018-04-22 DIAGNOSIS — Z992 Dependence on renal dialysis: Secondary | ICD-10-CM | POA: Diagnosis not present

## 2018-04-24 DIAGNOSIS — Z992 Dependence on renal dialysis: Secondary | ICD-10-CM | POA: Diagnosis not present

## 2018-04-24 DIAGNOSIS — N186 End stage renal disease: Secondary | ICD-10-CM | POA: Diagnosis not present

## 2018-04-26 DIAGNOSIS — N186 End stage renal disease: Secondary | ICD-10-CM | POA: Diagnosis not present

## 2018-04-26 DIAGNOSIS — Z992 Dependence on renal dialysis: Secondary | ICD-10-CM | POA: Diagnosis not present

## 2018-04-29 DIAGNOSIS — Z992 Dependence on renal dialysis: Secondary | ICD-10-CM | POA: Diagnosis not present

## 2018-04-29 DIAGNOSIS — N186 End stage renal disease: Secondary | ICD-10-CM | POA: Diagnosis not present

## 2018-05-01 DIAGNOSIS — N186 End stage renal disease: Secondary | ICD-10-CM | POA: Diagnosis not present

## 2018-05-01 DIAGNOSIS — Z992 Dependence on renal dialysis: Secondary | ICD-10-CM | POA: Diagnosis not present

## 2018-05-03 ENCOUNTER — Encounter: Payer: Self-pay | Admitting: Family

## 2018-05-03 ENCOUNTER — Ambulatory Visit: Payer: BLUE CROSS/BLUE SHIELD | Admitting: Family

## 2018-05-03 ENCOUNTER — Ambulatory Visit (INDEPENDENT_AMBULATORY_CARE_PROVIDER_SITE_OTHER): Payer: BLUE CROSS/BLUE SHIELD

## 2018-05-03 VITALS — BP 144/65 | HR 70 | Temp 101.1°F | Ht 69.0 in | Wt 148.0 lb

## 2018-05-03 DIAGNOSIS — R05 Cough: Secondary | ICD-10-CM

## 2018-05-03 DIAGNOSIS — N186 End stage renal disease: Secondary | ICD-10-CM

## 2018-05-03 DIAGNOSIS — Z992 Dependence on renal dialysis: Secondary | ICD-10-CM | POA: Diagnosis not present

## 2018-05-03 DIAGNOSIS — R059 Cough, unspecified: Secondary | ICD-10-CM

## 2018-05-03 DIAGNOSIS — A084 Viral intestinal infection, unspecified: Secondary | ICD-10-CM | POA: Diagnosis not present

## 2018-05-03 DIAGNOSIS — R509 Fever, unspecified: Secondary | ICD-10-CM

## 2018-05-03 DIAGNOSIS — R6889 Other general symptoms and signs: Secondary | ICD-10-CM | POA: Diagnosis not present

## 2018-05-03 LAB — VERITOR FLU A/B WAIVED
Influenza A: NEGATIVE
Influenza B: NEGATIVE

## 2018-05-03 MED ORDER — FLUTICASONE PROPIONATE 50 MCG/ACT NA SUSP: 2.0000 | Freq: Every day | NASAL | 6 refills | Status: DC

## 2018-05-03 MED ORDER — BENZONATATE 200 MG PO CAPS: 200.0000 mg | ORAL_CAPSULE | Freq: Three times a day (TID) | ORAL | 1 refills | Status: DC | PRN

## 2018-05-03 NOTE — Progress Notes (Signed)
Subjective:    Patient ID: [name], female    DOB: 09-10-1955, 63 y.o.   MRN: [id number]  Chief Complaint  Patient presents with  . Cough  . Diarrhea  . Nausea and vomiting  . Generalized Body Aches    Cough  This is a new problem. The current episode started in the past 7 days. The problem occurs every few minutes. The cough is non-productive. Associated symptoms include chills, a fever, headaches, myalgias, nasal congestion, postnasal drip, rhinorrhea and a sore throat. Pertinent negatives include no ear congestion, ear pain, shortness of breath or wheezing. She has tried OTC cough suppressant for the symptoms. The treatment provided mild relief.  Diarrhea   This is a new problem. The current episode started today. The problem occurs 2 to 4 times per day. The problem has been unchanged. Associated symptoms include chills, coughing, a fever, headaches, myalgias and vomiting.      Review of Systems  Constitutional: Positive for chills and fever.  HENT: Positive for postnasal drip, rhinorrhea and sore throat. Negative for ear pain.   Respiratory: Positive for cough. Negative for shortness of breath and wheezing.   Gastrointestinal: Positive for diarrhea and vomiting.  Musculoskeletal: Positive for myalgias.  Neurological: Positive for headaches.  All other systems reviewed and are negative.      Objective:   Physical Exam Vitals signs reviewed.  Constitutional:      General: She is not in acute distress.    Appearance: She is well-developed. She is ill-appearing.  HENT:     Head: Normocephalic and atraumatic.     Nose: Mucosal edema present.     Mouth/Throat:     Pharynx: Posterior oropharyngeal erythema present.  Eyes:     Pupils: Pupils are equal, round, and reactive to light.     Comments: Bilateral eye lids erythemas  Neck:     Musculoskeletal: Normal range of motion and neck supple.     Thyroid: No thyromegaly.  Cardiovascular:     Rate and Rhythm: Normal  rate and regular rhythm.     Heart sounds: Murmur present.  Pulmonary:     Effort: Pulmonary effort is normal. No respiratory distress.     Breath sounds: Normal breath sounds. No wheezing.  Abdominal:     General: Bowel sounds are normal. There is no distension.     Palpations: Abdomen is soft.     Tenderness: There is no abdominal tenderness.  Musculoskeletal:        General: No tenderness.     Comments: Generalized weakness present   Skin:    General: Skin is warm and dry.  Neurological:     Mental Status: She is alert and oriented to person, place, and time.     Cranial Nerves: No cranial nerve deficit.     Deep Tendon Reflexes: Reflexes are normal and symmetric.  Psychiatric:        Behavior: Behavior normal.        Thought Content: Thought content normal.        Judgment: Judgment normal.       BP (!) 144/65   Pulse 70   Temp (!) 101.1 F (38.4 C) (Oral)   Ht 5\' 9"  (1.753 m)   Wt 148 lb (67.1 kg)   BMI 21.86 kg/m      Assessment & Plan:  [name] comes in today with chief complaint of Cough; Diarrhea; Nausea and vomiting; and Generalized Body Aches   Diagnosis and orders  addressed:  1. Flu-like symptoms - Veritor Flu A/B Waived - DG Chest 2 View; Future  2. Viral gastroenteritis Force fluids Bland diet   3. Chronic kidney disease on chronic dialysis ([place])  4. Fever, unspecified fever cause - DG Chest 2 View; Future  5. Cough Flu negative  - DG Chest 2 View; Future   B/P stable at this time She is to only have 32 oz fluids a day, will increase by 16 oz  Light headedness present- I'm sure it is related to dehydration Flu negative  If symptoms worsen go to ED!!!  Evelina Dun, FNP

## 2018-05-03 NOTE — Addendum Note (Signed)
Addended by: Evelina Dun A on: 05/03/2018 04:20 PM   Modules accepted: Orders

## 2018-05-03 NOTE — Patient Instructions (Signed)
Viral Gastroenteritis, Adult    Viral gastroenteritis is also known as the stomach flu. This condition is caused by various viruses. These viruses can be passed from person to person very easily (are very contagious). This condition may affect your stomach, small intestine, and large intestine. It can cause sudden watery diarrhea, fever, and vomiting.  Diarrhea and vomiting can make you feel weak and cause you to become dehydrated. You may not be able to keep fluids down. Dehydration can make you tired and thirsty, cause you to have a dry mouth, and decrease how often you urinate. Older adults and people with other diseases or a weak immune system are at higher risk for dehydration.  It is important to replace the fluids that you lose from diarrhea and vomiting. If you become severely dehydrated, you may need to get fluids through an IV tube.  What are the causes?  Gastroenteritis is caused by various viruses, including rotavirus and norovirus. Norovirus is the most common cause in adults.  You can get sick by eating food, drinking water, or touching a surface contaminated with one of these viruses. You can also get sick from sharing utensils or other personal items with an infected person.  What increases the risk?  This condition is more likely to develop in people:  · Who have a weak defense system (immune system).  · Who live with one or more children who are younger than 2 years old.  · Who live in a nursing home.  · Who go on cruise ships.  What are the signs or symptoms?  Symptoms of this condition start suddenly 1-2 days after exposure to a virus. Symptoms may last a few days or as long as a week. The most common symptoms are watery diarrhea and vomiting. Other symptoms include:  · Fever.  · Headache.  · Fatigue.  · Pain in the abdomen.  · Chills.  · Weakness.  · Nausea.  · Muscle aches.  · Loss of appetite.  How is this diagnosed?  This condition is diagnosed with a medical history and physical exam. You  may also have a stool test to check for viruses or other infections.  How is this treated?  This condition typically goes away on its own. The focus of treatment is to restore lost fluids (rehydration). Your health care provider may recommend that you take an oral rehydration solution (ORS) to replace important salts and minerals (electrolytes) in your body. Severe cases of this condition may require giving fluids through an IV tube.  Treatment may also include medicine to help with your symptoms.  Follow these instructions at home:    Follow instructions from your health care provider about how to care for yourself at home.  Follow these recommendations as told by your health care provider:  · Take an ORS. This is a drink that is sold at pharmacies and retail stores.  · Drink clear fluids in small amounts as you are able. Clear fluids include water, ice chips, diluted fruit juice, and low-calorie sports drinks.  · Eat bland, easy-to-digest foods in small amounts as you are able. These foods include bananas, applesauce, rice, lean meats, toast, and crackers.  · Avoid fluids that contain a lot of sugar or caffeine, such as energy drinks, sports drinks, and soda.  · Avoid alcohol.  · Avoid spicy or fatty foods.  General instructions  · Drink enough fluid to keep your urine clear or pale yellow.  · Wash your hands often.   If soap and water are not available, use hand sanitizer.  · Make sure that all people in your household wash their hands well and often.  · Take over-the-counter and prescription medicines only as told by your health care provider.  · Rest at home while you recover.  · Watch your condition for any changes.  · Take a warm bath to relieve any burning or pain from frequent diarrhea episodes.  · Keep all follow-up visits as told by your health care provider. This is important.  Contact a health care provider if:  · You cannot keep fluids down.  · Your symptoms get worse.  · You have new symptoms.  · You  feel light-headed or dizzy.  · You have muscle cramps.  Get help right away if:  · You have chest pain.  · You feel extremely weak or you faint.  · You see blood in your vomit.  · Your vomit looks like coffee grounds.  · You have bloody or black stools or stools that look like tar.  · You have a severe headache, a stiff neck, or both.  · You have a rash.  · You have severe pain, cramping, or bloating in your abdomen.  · You have trouble breathing or you are breathing very quickly.  · Your heart is beating very quickly.  · Your skin feels cold and clammy.  · You feel confused.  · You have pain when you urinate.  · You have signs of dehydration, such as:  ? Dark urine, very little urine, or no urine.  ? Cracked lips.  ? Dry mouth.  ? Sunken eyes.  ? Sleepiness.  ? Weakness.  This information is not intended to replace advice given to you by your health care provider. Make sure you discuss any questions you have with your health care provider.  Document Released: 02/20/2005 Document Revised: 10/05/2016 Document Reviewed: 10/27/2014  Elsevier Interactive Patient Education © 2019 [employer] Inc[employer]

## 2018-05-04 DIAGNOSIS — Z992 Dependence on renal dialysis: Secondary | ICD-10-CM | POA: Diagnosis not present

## 2018-05-04 DIAGNOSIS — N186 End stage renal disease: Secondary | ICD-10-CM | POA: Diagnosis not present

## 2018-05-05 ENCOUNTER — Encounter (HOSPITAL_COMMUNITY): Payer: Self-pay | Admitting: Emergency Medicine

## 2018-05-05 ENCOUNTER — Other Ambulatory Visit: Payer: Self-pay

## 2018-05-05 ENCOUNTER — Emergency Department (HOSPITAL_COMMUNITY): Payer: BLUE CROSS/BLUE SHIELD

## 2018-05-05 ENCOUNTER — Emergency Department (HOSPITAL_COMMUNITY)
Admission: EM | Admit: 2018-05-05 | Discharge: 2018-05-05 | Disposition: A | Discharge status: Home / Self Care | Payer: BLUE CROSS/BLUE SHIELD | Attending: Emergency Medicine | Admitting: Emergency Medicine

## 2018-05-05 DIAGNOSIS — Z992 Dependence on renal dialysis: Secondary | ICD-10-CM | POA: Diagnosis not present

## 2018-05-05 DIAGNOSIS — R05 Cough: Secondary | ICD-10-CM | POA: Diagnosis not present

## 2018-05-05 DIAGNOSIS — N186 End stage renal disease: Secondary | ICD-10-CM | POA: Insufficient documentation

## 2018-05-05 DIAGNOSIS — Z794 Long term (current) use of insulin: Secondary | ICD-10-CM | POA: Diagnosis not present

## 2018-05-05 DIAGNOSIS — Z79899 Other long term (current) drug therapy: Secondary | ICD-10-CM | POA: Diagnosis not present

## 2018-05-05 DIAGNOSIS — I132 Hypertensive heart and chronic kidney disease with heart failure and with stage 5 chronic kidney disease, or end stage renal disease: Secondary | ICD-10-CM | POA: Diagnosis not present

## 2018-05-05 DIAGNOSIS — Z7982 Long term (current) use of aspirin: Secondary | ICD-10-CM | POA: Insufficient documentation

## 2018-05-05 DIAGNOSIS — E119 Type 2 diabetes mellitus without complications: Secondary | ICD-10-CM | POA: Insufficient documentation

## 2018-05-05 DIAGNOSIS — R059 Cough, unspecified: Secondary | ICD-10-CM

## 2018-05-05 DIAGNOSIS — I2510 Atherosclerotic heart disease of native coronary artery without angina pectoris: Secondary | ICD-10-CM | POA: Diagnosis not present

## 2018-05-05 DIAGNOSIS — M791 Myalgia, unspecified site: Secondary | ICD-10-CM | POA: Diagnosis not present

## 2018-05-05 DIAGNOSIS — I509 Heart failure, unspecified: Secondary | ICD-10-CM | POA: Insufficient documentation

## 2018-05-05 LAB — INFLUENZA PANEL BY PCR (TYPE A & B)
Influenza A By PCR: NEGATIVE
Influenza B By PCR: NEGATIVE

## 2018-05-05 MED ORDER — IPRATROPIUM-ALBUTEROL 0.5-2.5 (3) MG/3ML IN SOLN
3.0000 mL | Freq: Once | RESPIRATORY_TRACT | Status: AC
  Administered 2018-05-05: 3 mL via RESPIRATORY_TRACT
  Filled 2018-05-05: qty 3

## 2018-05-05 MED ORDER — DOXYCYCLINE HYCLATE 100 MG PO CAPS: 100.0000 mg | ORAL_CAPSULE | Freq: Two times a day (BID) | ORAL | 0 refills | Status: DC

## 2018-05-05 MED ORDER — PROMETHAZINE-DM 6.25-15 MG/5ML PO SYRP: 5.0000 mL | ORAL_SOLUTION | Freq: Four times a day (QID) | ORAL | 0 refills | Status: DC | PRN

## 2018-05-05 MED ORDER — ALBUTEROL SULFATE HFA 108 (90 BASE) MCG/ACT IN AERS: 1.0000 | INHALATION_SPRAY | Freq: Four times a day (QID) | RESPIRATORY_TRACT | 0 refills | Status: DC | PRN

## 2018-05-05 NOTE — ED Triage Notes (Signed)
Pt c/o of cough, nasal congestion, body aches, chills x 4 days

## 2018-05-05 NOTE — Discharge Instructions (Signed)
Stop the [employer]  Start the medications prescribed and take them as directed.  Be sure to finish the antibiotic as directed.  Follow-up with your primary doctor for recheck return to the ER for any worsening symptoms.

## 2018-05-06 DIAGNOSIS — Z992 Dependence on renal dialysis: Secondary | ICD-10-CM | POA: Diagnosis not present

## 2018-05-06 DIAGNOSIS — N186 End stage renal disease: Secondary | ICD-10-CM | POA: Diagnosis not present

## 2018-05-07 NOTE — ED Provider Notes (Signed)
[facility] EMERGENCY DEPARTMENT Provider Note   CSN: [id number] Arrival date & time: 05/05/18  1335    History   Chief Complaint Chief Complaint  Patient presents with  . Cough    HPI [name] is a 63 y.o. female.     HPI   [name] is a 63 y.o. female with a significant medical history including mitral regurgitation, diabetes, CHF, chronic kidney disease and is currently on dialysis and hypertension who presents to the Emergency Department complaining of generalized body aches, nasal congestion, rhinorrhea, and cough.  Symptoms have been present for 4 days.  She also endorses intermittent chills and describes her cough as mostly nonproductive.  She states she was recently seen by her PCP for her symptoms.  She denies increasing severity or worsening symptoms, but states they are not improving.  She does report recent sick contacts.  No known fever.  She denies chest pain, shortness of breath, lower extremity edema, dysuria, and sore throat.  She has not taken any over-the-counter medications for symptom relief.  She is a non-smoker.  No recent travel.    Past Medical History:  Diagnosis Date  . Anemia    of chronic disease  . Blood transfusion without reported diagnosis   . Cardiovascular disease    nonobstructive  . Carotid artery stenosis 2008  . CHF (congestive heart failure) ([place])   . Coronary artery disease   . Diabetes mellitus   . Dyslipidemia   . Edema of lower extremity    with hypo-albuminemia and profound protenuria  . Heart murmur   . Hypertension   . Mitral regurgitation   . Nephrotic syndrome   . Patent foramen ovale   . Pulmonary hypertension, moderate to severe ([place])   . Pulmonary nodule   . Tricuspid regurgitation   . Volume depletion, renal, due to output loss (renal deficit)     Patient Active Problem List   Diagnosis Date Noted  . Hydronephrosis   . Acute pyelonephritis 03/11/2018  . Abnormal LFTs (liver function tests)  03/11/2018  . Type 2 diabetes mellitus ([place]) 03/11/2018  . Valvular heart disease 03/11/2018  . Chronic kidney disease on chronic dialysis ([place]) 11/13/2017  . Nausea 09/21/2017  . Leukocytosis 09/21/2017  . Generalized weakness 09/21/2017  . Nausea & vomiting 05/30/2017  . Hyperlipidemia associated with type 2 diabetes mellitus ([place]) 02/22/2017  . ESRD (end stage renal disease) ([place]) 20-Aug-2016  . 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 Encounter for aftercare following kidney transplant 05/22/2016  . Chronic diastolic heart failure ([place]) 06/08/2015  . Aortic valve sclerosis 06/08/2015  . Anemia of chronic disease 07/03/2013  . Hypertension associated with diabetes ([place]) 12/18/2010  . Diabetes mellitus ([place]) 12/18/2010    Past Surgical History:  Procedure Laterality Date  . A/V FISTULAGRAM N/A 04/05/2017   Procedure: A/V FISTULAGRAM - Left Arm;  Surgeon: Angelia Mould, MD;  Location: [place] [facility];  Service: Cardiovascular;  Laterality: N/A;  . A/V FISTULAGRAM Left 09/18/2017   Procedure: A/V FISTULAGRAM;  Surgeon: Serafina Mitchell, MD;  Location: [place] [facility];  Service: Cardiovascular;  Laterality: Left;  . A/V SHUNT INTERVENTION Left 04/05/2017   Procedure: A/V SHUNT INTERVENTION;  Surgeon: Angelia Mould, MD;  Location: [place] [facility];  Service: Cardiovascular;  Laterality: Left;  . CARDIAC CATHETERIZATION  2008  . IR REMOVAL TUN CV CATH W/O FL  11/02/2016  . 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 PERIPHERAL VASCULAR BALLOON ANGIOPLASTY Left  09/18/2017   Procedure: PERIPHERAL VASCULAR BALLOON ANGIOPLASTY;  Surgeon: Serafina Mitchell, MD;  Location: [place] [facility];  Service: Cardiovascular;  Laterality: Left;  Arm Fistula     OB History   No obstetric history on file.      Home Medications    Prior to Admission medications   Medication Sig Start Date End Date Taking? Authorizing Provider  acetaminophen (TYLENOL) 500 MG tablet  Take 500 mg by mouth every 6 (six) hours as needed for moderate pain or headache.  04/06/16   [provider]  albuterol (PROVENTIL HFA;VENTOLIN HFA) 108 (90 Base) MCG/ACT inhaler Inhale 1-2 puffs into the lungs every 6 (six) hours as needed for wheezing or shortness of breath. 05/05/18   Naiya Corral, PA-C  amLODipine (NORVASC) 5 MG tablet TAKE 5 MG BY MOUTH NIGHTLY Patient taking differently: Take 5 mg by mouth daily. TAKE 5 MG BY MOUTH NIGHTLY 10/24/17   Eustaquio Maize, MD  aspirin (ASPIR-LOW) 81 MG EC tablet Take 81 mg by mouth daily.     [provider]  doxycycline (VIBRAMYCIN) 100 MG capsule Take 1 capsule (100 mg total) by mouth 2 (two) times daily. 05/05/18   Megann Easterwood, PA-C  fluticasone (FLONASE) 50 MCG/ACT nasal spray Place 2 sprays into both nostrils daily. 05/03/18   Evelina Dun A, FNP  insulin degludec (TRESIBA FLEXTOUCH) 100 UNIT/ML SOPN FlexTouch Pen Inject 0.08 mLs (8 Units total) into the skin daily at 10 pm. 09/25/17   Sharion Balloon, FNP  isosorbide mononitrate (IMDUR) 60 MG 24 hr tablet TAKE 1 TABLET BY MOUTH ONCE DAILY 10/29/17   Evelina Dun A, FNP  lidocaine-prilocaine (EMLA) cream Apply 1 application topically as needed (for dialysis).  09/08/16   [provider]  metoprolol tartrate (LOPRESSOR) 25 MG tablet Take 0.5 tablets (12.5 mg total) by mouth 2 (two) times daily. 09/25/17   Evelina Dun A, FNP  ondansetron (ZOFRAN ODT) 4 MG disintegrating tablet 4mg  ODT q4 hours prn nausea/vomit Patient taking differently: Take 4 mg by mouth 2 (two) times daily. 4mg  ODT q4 hours prn nausea/vomit 11/26/17   Milton Ferguson, MD  ondansetron (ZOFRAN) 4 MG tablet Take 1 tablet (4 mg total) by mouth every 8 (eight) hours as needed. 04/01/18   Rolland Porter, MD  predniSONE (DELTASONE) 5 MG tablet TAKE 1 TABLET BY MOUTH ONCE DAILY WITH BREAKFAST 04/22/18   Hawks, Christy A, FNP  PROGRAF 0.5 MG capsule Take 0.5-1 mg by mouth See admin instructions. Take 1 mg by mouth  in the morning and take 0.5 mg by mouth at bedtime 10/24/16   [provider]  promethazine-dextromethorphan (PROMETHAZINE-DM) 6.25-15 MG/5ML syrup Take 5 mLs by mouth 4 (four) times daily as needed. May cause drowsiness 05/05/18   Galen Russman, PA-C  rosuvastatin (CRESTOR) 20 MG tablet Take 1 tablet (20 mg total) by mouth daily. 09/25/17   Evelina Dun A, FNP  sevelamer carbonate (RENVELA) 800 MG tablet Take 800 mg by mouth See admin instructions. 4 tablets with meals and 2 tablets with snacks. 10/02/16   [provider]  TRULICITY 1.5 SM/2.7MB SOPN INJECT 1.5 MG SUBCUTANEOUSLY ONCE A WEEK 04/15/18   Sharion Balloon, FNP    Family History Family History  Problem Relation Age of Onset  . Kidney disease Mother   . Diabetes Mother   . Diabetes Father   . Heart disease Father     Social History Social History   Tobacco Use  . Smoking status: Never Smoker  .  Smokeless tobacco: Never Used  Substance Use Topics  . Alcohol use: No  . Drug use: No     Allergies   Other and Tape   Review of Systems Review of Systems  Constitutional: Positive for chills. Negative for activity change, appetite change and fever.  HENT: Positive for congestion and rhinorrhea. Negative for facial swelling, sore throat and trouble swallowing.   Eyes: Negative for visual disturbance.  Respiratory: Positive for cough. Negative for chest tightness, shortness of breath, wheezing and stridor.   Gastrointestinal: Negative for abdominal pain, nausea and vomiting.  Genitourinary: Negative for decreased urine volume and dysuria.  Musculoskeletal: Negative for neck pain and neck stiffness.  Skin: Negative for rash.  Neurological: Negative for dizziness, weakness, numbness and headaches.  Hematological: Negative for adenopathy.  Psychiatric/Behavioral: Negative for confusion.     Physical Exam Updated Vital Signs BP (!) 146/48 (BP Location: Right Arm)   Pulse 69   Temp 99 F (37.2 C)  (Oral)   Resp 20   Ht 5\' 9"  (1.753 m)   Wt 67.1 kg   SpO2 95%   BMI 21.86 kg/m   Physical Exam Vitals signs and nursing note reviewed.  Constitutional:      General: She is not in acute distress.    Appearance: She is well-developed.  HENT:     Head: Normocephalic and atraumatic.     Jaw: No trismus.     Right Ear: Tympanic membrane and ear canal normal.     Left Ear: Tympanic membrane and ear canal normal.     Nose: Mucosal edema, congestion and rhinorrhea present.     Mouth/Throat:     Pharynx: Uvula midline. No oropharyngeal exudate, posterior oropharyngeal erythema or uvula swelling.     Tonsils: No tonsillar abscesses.  Eyes:     Conjunctiva/sclera: Conjunctivae normal.     Pupils: Pupils are equal, round, and reactive to light.  Neck:     Musculoskeletal: Full passive range of motion without pain and normal range of motion.     Trachea: Phonation normal.  Cardiovascular:     Rate and Rhythm: Normal rate and regular rhythm.     Pulses: Normal pulses.     Heart sounds: Murmur present.  Pulmonary:     Effort: Pulmonary effort is normal. No respiratory distress.     Breath sounds: No stridor. Wheezing present. No rales.     Comments: Few scattered expiratory wheezes, no rales.  Patient is able to speak in full and complete sentences without respiratory distress. Chest:     Chest wall: No tenderness.  Abdominal:     General: There is no distension.     Palpations: Abdomen is soft.     Tenderness: There is no abdominal tenderness. There is no guarding or rebound.  Musculoskeletal:     Right lower leg: No edema.     Left lower leg: No edema.  Lymphadenopathy:     Cervical: No cervical adenopathy.  Skin:    General: Skin is warm.     Findings: No rash.  Neurological:     General: No focal deficit present.     Mental Status: She is alert. Mental status is at baseline.     Sensory: No sensory deficit.     Motor: No weakness or abnormal muscle tone.      ED  Treatments / Results  Labs (all labs ordered are listed, but only abnormal results are displayed) Labs Reviewed  INFLUENZA PANEL BY PCR (TYPE A &  B)    EKG None  Radiology Dg Chest 2 View  Result Date: 05/05/2018 CLINICAL DATA:  Cough congestion EXAM: CHEST - 2 VIEW COMPARISON:  05/03/2010 FINDINGS: Cardiac shadow is within normal limits. Aortic calcifications are again seen. The lungs are well aerated bilaterally. No focal infiltrate or sizable effusion is seen. No bony abnormality is noted. IMPRESSION: No acute abnormality noted. Electronically Signed   By: Inez Catalina M.D.   On: 05/05/2018 14:24    Procedures Procedures (including critical care time)  Medications Ordered in ED Medications  ipratropium-albuterol (DUONEB) 0.5-2.5 (3) MG/3ML nebulizer solution 3 mL (3 mLs Nebulization Given 05/05/18 1553)     Initial Impression / Assessment and Plan / ED Course  I have reviewed the triage vital signs and the nursing notes.  Pertinent labs & imaging results that were available during my care of the patient were reviewed by me and considered in my medical decision making (see chart for details).        Patient is nontoxic-appearing, no tachycardia, tachypnea or hypoxia.  Influenza testing is negative, chest x-ray negative for pneumonia.  Mucous membranes are moist.  Patient with cough and URI symptoms that is probably viral, but given her extensive medical history and symptoms persistent for several days I will prescribe antibiotics and albuterol MDI.    On recheck after albuterol, lung sounds have improved.  Patient reports feeling better.  I feel she is appropriate for discharge home, I have discussed strict return precautions and need for recheck in 2 to 3 days if not improving.  Patient and family member agree to plan and verbalized understanding.    Final Clinical Impressions(s) / ED Diagnoses   Final diagnoses:  Cough    ED Discharge Orders         Ordered     promethazine-dextromethorphan (PROMETHAZINE-DM) 6.25-15 MG/5ML syrup  4 times daily PRN     05/05/18 1640    doxycycline (VIBRAMYCIN) 100 MG capsule  2 times daily     05/05/18 1640    albuterol (PROVENTIL HFA;VENTOLIN HFA) 108 (90 Base) MCG/ACT inhaler  Every 6 hours PRN     05/05/18 1641           Kem Parkinson, PA-C 05/07/18 1404    Davonna Belling, MD 05/08/18 [phone]

## 2018-05-08 DIAGNOSIS — N186 End stage renal disease: Secondary | ICD-10-CM | POA: Diagnosis not present

## 2018-05-08 DIAGNOSIS — Z992 Dependence on renal dialysis: Secondary | ICD-10-CM | POA: Diagnosis not present

## 2018-05-10 DIAGNOSIS — Z992 Dependence on renal dialysis: Secondary | ICD-10-CM | POA: Diagnosis not present

## 2018-05-10 DIAGNOSIS — N186 End stage renal disease: Secondary | ICD-10-CM | POA: Diagnosis not present

## 2018-05-12 ENCOUNTER — Other Ambulatory Visit: Payer: Self-pay | Admitting: Pediatrics

## 2018-05-12 ENCOUNTER — Other Ambulatory Visit: Payer: Self-pay | Admitting: Family

## 2018-05-12 DIAGNOSIS — Z794 Long term (current) use of insulin: Principal | ICD-10-CM

## 2018-05-12 DIAGNOSIS — I152 Hypertension secondary to endocrine disorders: Secondary | ICD-10-CM

## 2018-05-12 DIAGNOSIS — I10 Essential (primary) hypertension: Principal | ICD-10-CM

## 2018-05-12 DIAGNOSIS — E1159 Type 2 diabetes mellitus with other circulatory complications: Secondary | ICD-10-CM

## 2018-05-12 DIAGNOSIS — E1165 Type 2 diabetes mellitus with hyperglycemia: Secondary | ICD-10-CM

## 2018-05-13 DIAGNOSIS — N186 End stage renal disease: Secondary | ICD-10-CM | POA: Diagnosis not present

## 2018-05-13 DIAGNOSIS — Z992 Dependence on renal dialysis: Secondary | ICD-10-CM | POA: Diagnosis not present

## 2018-05-15 DIAGNOSIS — Z992 Dependence on renal dialysis: Secondary | ICD-10-CM | POA: Diagnosis not present

## 2018-05-15 DIAGNOSIS — N186 End stage renal disease: Secondary | ICD-10-CM | POA: Diagnosis not present

## 2018-05-17 DIAGNOSIS — N186 End stage renal disease: Secondary | ICD-10-CM | POA: Diagnosis not present

## 2018-05-17 DIAGNOSIS — Z992 Dependence on renal dialysis: Secondary | ICD-10-CM | POA: Diagnosis not present

## 2018-05-20 DIAGNOSIS — Z992 Dependence on renal dialysis: Secondary | ICD-10-CM | POA: Diagnosis not present

## 2018-05-20 DIAGNOSIS — N186 End stage renal disease: Secondary | ICD-10-CM | POA: Diagnosis not present

## 2018-05-21 DIAGNOSIS — I272 Pulmonary hypertension, unspecified: Secondary | ICD-10-CM | POA: Diagnosis not present

## 2018-05-21 DIAGNOSIS — I132 Hypertensive heart and chronic kidney disease with heart failure and with stage 5 chronic kidney disease, or end stage renal disease: Secondary | ICD-10-CM | POA: Diagnosis not present

## 2018-05-21 DIAGNOSIS — R0989 Other specified symptoms and signs involving the circulatory and respiratory systems: Secondary | ICD-10-CM | POA: Diagnosis not present

## 2018-05-21 DIAGNOSIS — I5032 Chronic diastolic (congestive) heart failure: Secondary | ICD-10-CM | POA: Diagnosis not present

## 2018-05-22 DIAGNOSIS — Z992 Dependence on renal dialysis: Secondary | ICD-10-CM | POA: Diagnosis not present

## 2018-05-22 DIAGNOSIS — N186 End stage renal disease: Secondary | ICD-10-CM | POA: Diagnosis not present

## 2018-05-24 DIAGNOSIS — Z992 Dependence on renal dialysis: Secondary | ICD-10-CM | POA: Diagnosis not present

## 2018-05-24 DIAGNOSIS — N186 End stage renal disease: Secondary | ICD-10-CM | POA: Diagnosis not present

## 2018-05-27 DIAGNOSIS — N186 End stage renal disease: Secondary | ICD-10-CM | POA: Diagnosis not present

## 2018-05-27 DIAGNOSIS — Z992 Dependence on renal dialysis: Secondary | ICD-10-CM | POA: Diagnosis not present

## 2018-05-29 DIAGNOSIS — Z992 Dependence on renal dialysis: Secondary | ICD-10-CM | POA: Diagnosis not present

## 2018-05-29 DIAGNOSIS — N186 End stage renal disease: Secondary | ICD-10-CM | POA: Diagnosis not present

## 2018-05-31 DIAGNOSIS — Z992 Dependence on renal dialysis: Secondary | ICD-10-CM | POA: Diagnosis not present

## 2018-05-31 DIAGNOSIS — N186 End stage renal disease: Secondary | ICD-10-CM | POA: Diagnosis not present

## 2018-06-03 DIAGNOSIS — Z992 Dependence on renal dialysis: Secondary | ICD-10-CM | POA: Diagnosis not present

## 2018-06-03 DIAGNOSIS — N186 End stage renal disease: Secondary | ICD-10-CM | POA: Diagnosis not present

## 2018-06-05 DIAGNOSIS — N186 End stage renal disease: Secondary | ICD-10-CM | POA: Diagnosis not present

## 2018-06-05 DIAGNOSIS — Z992 Dependence on renal dialysis: Secondary | ICD-10-CM | POA: Diagnosis not present

## 2018-06-07 DIAGNOSIS — Z992 Dependence on renal dialysis: Secondary | ICD-10-CM | POA: Diagnosis not present

## 2018-06-07 DIAGNOSIS — N186 End stage renal disease: Secondary | ICD-10-CM | POA: Diagnosis not present

## 2018-06-10 DIAGNOSIS — N186 End stage renal disease: Secondary | ICD-10-CM | POA: Diagnosis not present

## 2018-06-10 DIAGNOSIS — Z992 Dependence on renal dialysis: Secondary | ICD-10-CM | POA: Diagnosis not present

## 2018-06-12 DIAGNOSIS — N186 End stage renal disease: Secondary | ICD-10-CM | POA: Diagnosis not present

## 2018-06-12 DIAGNOSIS — Z992 Dependence on renal dialysis: Secondary | ICD-10-CM | POA: Diagnosis not present

## 2018-06-14 DIAGNOSIS — Z992 Dependence on renal dialysis: Secondary | ICD-10-CM | POA: Diagnosis not present

## 2018-06-14 DIAGNOSIS — N186 End stage renal disease: Secondary | ICD-10-CM | POA: Diagnosis not present

## 2018-06-16 ENCOUNTER — Other Ambulatory Visit: Payer: Self-pay | Admitting: Family

## 2018-06-16 DIAGNOSIS — Z794 Long term (current) use of insulin: Principal | ICD-10-CM

## 2018-06-16 DIAGNOSIS — E1165 Type 2 diabetes mellitus with hyperglycemia: Secondary | ICD-10-CM

## 2018-06-17 DIAGNOSIS — Z992 Dependence on renal dialysis: Secondary | ICD-10-CM | POA: Diagnosis not present

## 2018-06-17 DIAGNOSIS — N186 End stage renal disease: Secondary | ICD-10-CM | POA: Diagnosis not present

## 2018-06-18 ENCOUNTER — Ambulatory Visit (INDEPENDENT_AMBULATORY_CARE_PROVIDER_SITE_OTHER): Payer: BLUE CROSS/BLUE SHIELD

## 2018-06-18 ENCOUNTER — Encounter: Payer: Self-pay | Admitting: Family

## 2018-06-18 ENCOUNTER — Ambulatory Visit: Payer: BLUE CROSS/BLUE SHIELD | Admitting: Family

## 2018-06-18 ENCOUNTER — Other Ambulatory Visit: Payer: Self-pay

## 2018-06-18 VITALS — BP 163/63 | HR 55 | Temp 97.0°F | Ht 69.0 in | Wt 150.2 lb

## 2018-06-18 DIAGNOSIS — W19XXXA Unspecified fall, initial encounter: Secondary | ICD-10-CM

## 2018-06-18 DIAGNOSIS — R0789 Other chest pain: Secondary | ICD-10-CM | POA: Diagnosis not present

## 2018-06-18 DIAGNOSIS — S2231XA Fracture of one rib, right side, initial encounter for closed fracture: Secondary | ICD-10-CM | POA: Diagnosis not present

## 2018-06-18 DIAGNOSIS — S2241XA Multiple fractures of ribs, right side, initial encounter for closed fracture: Secondary | ICD-10-CM | POA: Diagnosis not present

## 2018-06-18 MED ORDER — TRAMADOL HCL 50 MG PO TABS: 50.0000 mg | ORAL_TABLET | Freq: Two times a day (BID) | ORAL | 0 refills | Status: DC | PRN

## 2018-06-18 NOTE — Patient Instructions (Signed)
Rib Fracture    A rib fracture is a break or crack in one of the bones of the ribs. The ribs are long, curved bones that wrap around your chest and attach to your spine and your breastbone. The ribs protect your heart, lungs, and other organs in the chest.  A broken or cracked rib is often painful but is not usually serious. Most rib fractures heal on their own over time. However, rib fractures can be more serious if multiple ribs are broken or if broken ribs move out of place and push against other structures or organs.  What are the causes?  This condition is caused by:   Repetitive movements with high force, such as pitching a baseball or having severe coughing spells.   A direct blow to the chest, such as a sports injury, a car accident, or a fall.   Cancer that has spread to the bones, which can weaken bones and cause them to break.  What are the signs or symptoms?  Symptoms of this condition include:   Pain when you breathe in or cough.   Pain when someone presses on the injured area.   Feeling short of breath.  How is this diagnosed?  This condition is diagnosed with a physical exam and medical history. Imaging tests may also be done, such as:   Chest X-ray.   CT scan.   MRI.   Bone scan.   Chest ultrasound.  How is this treated?  Treatment for this condition depends on the severity of the fracture. Most rib fractures usually heal on their own in 1-3 months. Sometimes healing takes longer if there is a cough that does not stop or if there are other activities that make the injury worse (aggravating factors). While you heal, you will be given medicines to control the pain. You will also be taught deep breathing exercises.  Severe injuries may require hospitalization or surgery.  Follow these instructions at home:  Managing pain, stiffness, and swelling   If directed, apply ice to the injured area.  ? Put ice in a plastic bag.  ? Place a towel between your skin and the bag.  ? Leave the ice on for  20 minutes, 2-3 times a day.   Take over-the-counter and prescription medicines only as told by your health care provider.  Activity   Avoid a lot of activity and any activities or movements that cause pain. Be careful during activities and avoid bumping the injured rib.   Slowly increase your activity as told by your health care provider.  General instructions   Do deep breathing exercises as told by your health care provider. This helps prevent pneumonia, which is a common complication of a broken rib. Your health care provider may instruct you to:  ? Take deep breaths several times a day.  ? Try to cough several times a day, holding a pillow against the injured area.  ? Use a device called incentive spirometer to practice deep breathing several times a day.   Drink enough fluid to keep your urine pale yellow.   Do not wear a rib belt or binder. These restrict breathing, which can lead to pneumonia.   Keep all follow-up visits as told by your health care provider. This is important.  Contact a health care provider if:   You have a fever.  Get help right away if:   You have difficulty breathing or you are short of breath.   You develop a   cough that does not stop, or you cough up thick or bloody sputum.   You have nausea, vomiting, or pain in your abdomen.   Your pain gets worse and medicine does not help.  Summary   A rib fracture is a break or crack in one of the bones of the ribs.   A broken or cracked rib is often painful but is not usually serious.   Most rib fractures heal on their own over time.   Treatment for this condition depends on the severity of the fracture.   Avoid a lot of activity and any activities or movements that cause pain.  This information is not intended to replace advice given to you by your health care provider. Make sure you discuss any questions you have with your health care provider.  Document Released: 02/20/2005 Document Revised: 05/22/2016 Document Reviewed:  05/22/2016  Elsevier Interactive Patient Education  2019 [employer]

## 2018-06-18 NOTE — Progress Notes (Signed)
Subjective:    Patient ID: [name], female    DOB: 1955/06/22, 63 y.o.   MRN: [id number]  Fall  The accident occurred 2 days ago. The fall occurred while walking. She landed on hard floor. There was no blood loss. Point of impact: right ribs. Pain location: right ribs. The pain is at a severity of 10/10. The pain is moderate. The symptoms are aggravated by movement and pressure on injury. Pertinent negatives include no bowel incontinence, headaches, hearing loss, hematuria, loss of consciousness, tingling or visual change. She has tried rest and acetaminophen for the symptoms. The treatment provided mild relief.      Review of Systems  Gastrointestinal: Negative for bowel incontinence.  Genitourinary: Negative for hematuria.  Neurological: Negative for tingling, loss of consciousness and headaches.  All other systems reviewed and are negative.      Objective:   Physical Exam Vitals signs reviewed.  Constitutional:      General: She is not in acute distress.    Appearance: She is well-developed.  HENT:     Head: Normocephalic and atraumatic.  Neck:     Musculoskeletal: Normal range of motion and neck supple.     Thyroid: No thyromegaly.  Cardiovascular:     Rate and Rhythm: Normal rate and regular rhythm.     Heart sounds: Normal heart sounds. No murmur.  Pulmonary:     Effort: Pulmonary effort is normal. No respiratory distress.     Breath sounds: Normal breath sounds. No wheezing.  Abdominal:     General: Bowel sounds are normal. There is no distension.     Palpations: Abdomen is soft.     Tenderness: There is no abdominal tenderness.  Musculoskeletal: Normal range of motion.        General: No tenderness.  Skin:    General: Skin is warm and dry.     Findings: Bruising present.          Comments: Bruising and pain with palpation of right lower ribs   Neurological:     Mental Status: She is alert and oriented to person, place, and time.     Cranial Nerves:  No cranial nerve deficit.     Deep Tendon Reflexes: Reflexes are normal and symmetric.  Psychiatric:        Behavior: Behavior normal.        Thought Content: Thought content normal.        Judgment: Judgment normal.     BP (!) 163/63   Pulse (!) 55   Temp (!) 97 F (36.1 C) (Oral)   Ht 5\' 9"  (1.753 m)   Wt 150 lb 3.2 oz (68.1 kg)   BMI 22.18 kg/m        Assessment & Plan:  [name] comes in today with chief complaint of Fall (injured right rib and mid abdominal with pain)   Diagnosis and orders addressed:  1. Fall, initial encounter - DG Ribs Unilateral W/Chest Right; Future - traMADol (ULTRAM) 50 MG tablet; Take 1 tablet (50 mg total) by mouth every 12 (twelve) hours as needed.  Dispense: 60 tablet; Refill: 0  2. Closed traumatic displaced fracture of rib on right side, initial encounter - traMADol (ULTRAM) 50 MG tablet; Take 1 tablet (50 mg total) by mouth every 12 (twelve) hours as needed.  Dispense: 60 tablet; Refill: 0  Since she fell two days ago and is stable Discussed if she has not SOB or increased pain go to ED!! Will  give Ultram for pain, sedation precautions discussed RTO in 1 week to repeat x-ray  Evelina Dun, FNP

## 2018-06-19 DIAGNOSIS — N186 End stage renal disease: Secondary | ICD-10-CM | POA: Diagnosis not present

## 2018-06-19 DIAGNOSIS — Z992 Dependence on renal dialysis: Secondary | ICD-10-CM | POA: Diagnosis not present

## 2018-06-21 DIAGNOSIS — Z992 Dependence on renal dialysis: Secondary | ICD-10-CM | POA: Diagnosis not present

## 2018-06-21 DIAGNOSIS — N186 End stage renal disease: Secondary | ICD-10-CM | POA: Diagnosis not present

## 2018-06-24 DIAGNOSIS — N186 End stage renal disease: Secondary | ICD-10-CM | POA: Diagnosis not present

## 2018-06-24 DIAGNOSIS — Z992 Dependence on renal dialysis: Secondary | ICD-10-CM | POA: Diagnosis not present

## 2018-06-25 ENCOUNTER — Ambulatory Visit: Payer: BLUE CROSS/BLUE SHIELD | Admitting: Family

## 2018-06-25 DIAGNOSIS — Z79899 Other long term (current) drug therapy: Secondary | ICD-10-CM | POA: Diagnosis not present

## 2018-06-26 ENCOUNTER — Other Ambulatory Visit: Payer: Self-pay

## 2018-06-26 DIAGNOSIS — Z992 Dependence on renal dialysis: Secondary | ICD-10-CM | POA: Diagnosis not present

## 2018-06-26 DIAGNOSIS — N186 End stage renal disease: Secondary | ICD-10-CM | POA: Diagnosis not present

## 2018-06-27 ENCOUNTER — Ambulatory Visit: Payer: BLUE CROSS/BLUE SHIELD | Admitting: Family

## 2018-06-27 ENCOUNTER — Other Ambulatory Visit: Payer: Self-pay

## 2018-06-27 ENCOUNTER — Ambulatory Visit (INDEPENDENT_AMBULATORY_CARE_PROVIDER_SITE_OTHER): Payer: BLUE CROSS/BLUE SHIELD

## 2018-06-27 ENCOUNTER — Encounter: Payer: Self-pay | Admitting: Family

## 2018-06-27 VITALS — BP 159/67 | HR 58 | Temp 97.1°F | Ht 69.0 in | Wt 149.2 lb

## 2018-06-27 DIAGNOSIS — I120 Hypertensive chronic kidney disease with stage 5 chronic kidney disease or end stage renal disease: Secondary | ICD-10-CM | POA: Diagnosis not present

## 2018-06-27 DIAGNOSIS — S2231XD Fracture of one rib, right side, subsequent encounter for fracture with routine healing: Secondary | ICD-10-CM

## 2018-06-27 DIAGNOSIS — S2231XA Fracture of one rib, right side, initial encounter for closed fracture: Secondary | ICD-10-CM

## 2018-06-27 DIAGNOSIS — S2241XA Multiple fractures of ribs, right side, initial encounter for closed fracture: Secondary | ICD-10-CM | POA: Diagnosis not present

## 2018-06-27 DIAGNOSIS — E0821 Diabetes mellitus due to underlying condition with diabetic nephropathy: Secondary | ICD-10-CM | POA: Diagnosis not present

## 2018-06-27 DIAGNOSIS — Z940 Kidney transplant status: Secondary | ICD-10-CM | POA: Diagnosis not present

## 2018-06-27 DIAGNOSIS — N186 End stage renal disease: Secondary | ICD-10-CM | POA: Diagnosis not present

## 2018-06-27 NOTE — Patient Instructions (Signed)
Rib Fracture    A rib fracture is a break or crack in one of the bones of the ribs. The ribs are long, curved bones that wrap around your chest and attach to your spine and your breastbone. The ribs protect your heart, lungs, and other organs in the chest.  A broken or cracked rib is often painful but is not usually serious. Most rib fractures heal on their own over time. However, rib fractures can be more serious if multiple ribs are broken or if broken ribs move out of place and push against other structures or organs.  What are the causes?  This condition is caused by:   Repetitive movements with high force, such as pitching a baseball or having severe coughing spells.   A direct blow to the chest, such as a sports injury, a car accident, or a fall.   Cancer that has spread to the bones, which can weaken bones and cause them to break.  What are the signs or symptoms?  Symptoms of this condition include:   Pain when you breathe in or cough.   Pain when someone presses on the injured area.   Feeling short of breath.  How is this diagnosed?  This condition is diagnosed with a physical exam and medical history. Imaging tests may also be done, such as:   Chest X-ray.   CT scan.   MRI.   Bone scan.   Chest ultrasound.  How is this treated?  Treatment for this condition depends on the severity of the fracture. Most rib fractures usually heal on their own in 1-3 months. Sometimes healing takes longer if there is a cough that does not stop or if there are other activities that make the injury worse (aggravating factors). While you heal, you will be given medicines to control the pain. You will also be taught deep breathing exercises.  Severe injuries may require hospitalization or surgery.  Follow these instructions at home:  Managing pain, stiffness, and swelling   If directed, apply ice to the injured area.  ? Put ice in a plastic bag.  ? Place a towel between your skin and the bag.  ? Leave the ice on for  20 minutes, 2-3 times a day.   Take over-the-counter and prescription medicines only as told by your health care provider.  Activity   Avoid a lot of activity and any activities or movements that cause pain. Be careful during activities and avoid bumping the injured rib.   Slowly increase your activity as told by your health care provider.  General instructions   Do deep breathing exercises as told by your health care provider. This helps prevent pneumonia, which is a common complication of a broken rib. Your health care provider may instruct you to:  ? Take deep breaths several times a day.  ? Try to cough several times a day, holding a pillow against the injured area.  ? Use a device called incentive spirometer to practice deep breathing several times a day.   Drink enough fluid to keep your urine pale yellow.   Do not wear a rib belt or binder. These restrict breathing, which can lead to pneumonia.   Keep all follow-up visits as told by your health care provider. This is important.  Contact a health care provider if:   You have a fever.  Get help right away if:   You have difficulty breathing or you are short of breath.   You develop a   cough that does not stop, or you cough up thick or bloody sputum.   You have nausea, vomiting, or pain in your abdomen.   Your pain gets worse and medicine does not help.  Summary   A rib fracture is a break or crack in one of the bones of the ribs.   A broken or cracked rib is often painful but is not usually serious.   Most rib fractures heal on their own over time.   Treatment for this condition depends on the severity of the fracture.   Avoid a lot of activity and any activities or movements that cause pain.  This information is not intended to replace advice given to you by your health care provider. Make sure you discuss any questions you have with your health care provider.  Document Released: 02/20/2005 Document Revised: 05/22/2016 Document Reviewed:  05/22/2016  Elsevier Interactive Patient Education  2019 [employer]

## 2018-06-27 NOTE — Progress Notes (Signed)
   Subjective:    Patient ID: [name], female    DOB: June 20, 1955, 63 y.o.   MRN: [id number]  Chief Complaint  Patient presents with  . recheck rib injury    Chest Pain   This is a new problem. The current episode started 1 to 4 weeks ago. The onset quality is gradual. The problem occurs intermittently. The problem has been waxing and waning. The pain is at a severity of 6/10. The pain is mild. Radiates to: right ribs. The pain is aggravated by coughing, deep breathing, movement and lifting. The treatment provided mild relief.      Review of Systems  Cardiovascular: Positive for chest pain.  All other systems reviewed and are negative.      Objective:   Physical Exam Vitals signs reviewed.  Constitutional:      General: She is not in acute distress.    Appearance: She is well-developed.  HENT:     Head: Normocephalic and atraumatic.  Neck:     Musculoskeletal: Normal range of motion and neck supple.     Thyroid: No thyromegaly.  Cardiovascular:     Rate and Rhythm: Normal rate and regular rhythm.     Heart sounds: Normal heart sounds. No murmur.  Pulmonary:     Effort: Pulmonary effort is normal. No respiratory distress.     Breath sounds: Normal breath sounds. No wheezing.  Abdominal:     General: Bowel sounds are normal. There is no distension.     Palpations: Abdomen is soft.     Tenderness: There is no abdominal tenderness.  Musculoskeletal:        General: Tenderness present.       Arms:  Skin:    General: Skin is warm and dry.     Findings: Ecchymosis present.     Comments: Tenderness and ecchymosis present  Neurological:     Mental Status: She is alert and oriented to person, place, and time.     Cranial Nerves: No cranial nerve deficit.     Deep Tendon Reflexes: Reflexes are normal and symmetric.  Psychiatric:        Behavior: Behavior normal.        Thought Content: Thought content normal.        Judgment: Judgment normal.       BP (!)  145/64   Pulse (!) 58   Temp (!) 97.1 F (36.2 C) (Oral)   Ht 5\' 9"  (1.753 m)   Wt 149 lb 3.2 oz (67.7 kg)   BMI 22.03 kg/m      Assessment & Plan:  JANILLE DRAUGHON comes in today with chief complaint of recheck rib injury   Diagnosis and orders addressed:  1. Closed traumatic displaced fracture of rib on right side, initial encounter Continue Ultram Start taking Zofran 30 mins before Ultram  Continue deep breathing and coughing Fall preventions discussed RTO if symptoms worsen or do not improve, if any SOB go to ED - DG Ribs Unilateral W/Chest Right   Evelina Dun, FNP

## 2018-06-28 DIAGNOSIS — Z992 Dependence on renal dialysis: Secondary | ICD-10-CM | POA: Diagnosis not present

## 2018-06-28 DIAGNOSIS — N186 End stage renal disease: Secondary | ICD-10-CM | POA: Diagnosis not present

## 2018-07-01 DIAGNOSIS — N186 End stage renal disease: Secondary | ICD-10-CM | POA: Diagnosis not present

## 2018-07-01 DIAGNOSIS — Z992 Dependence on renal dialysis: Secondary | ICD-10-CM | POA: Diagnosis not present

## 2018-07-03 DIAGNOSIS — N186 End stage renal disease: Secondary | ICD-10-CM | POA: Diagnosis not present

## 2018-07-03 DIAGNOSIS — Z992 Dependence on renal dialysis: Secondary | ICD-10-CM | POA: Diagnosis not present

## 2018-07-04 DIAGNOSIS — N186 End stage renal disease: Secondary | ICD-10-CM | POA: Diagnosis not present

## 2018-07-04 DIAGNOSIS — Z992 Dependence on renal dialysis: Secondary | ICD-10-CM | POA: Diagnosis not present

## 2018-07-05 DIAGNOSIS — Z992 Dependence on renal dialysis: Secondary | ICD-10-CM | POA: Diagnosis not present

## 2018-07-05 DIAGNOSIS — N186 End stage renal disease: Secondary | ICD-10-CM | POA: Diagnosis not present

## 2018-07-08 DIAGNOSIS — Z992 Dependence on renal dialysis: Secondary | ICD-10-CM | POA: Diagnosis not present

## 2018-07-08 DIAGNOSIS — N186 End stage renal disease: Secondary | ICD-10-CM | POA: Diagnosis not present

## 2018-07-10 DIAGNOSIS — N186 End stage renal disease: Secondary | ICD-10-CM | POA: Diagnosis not present

## 2018-07-10 DIAGNOSIS — Z992 Dependence on renal dialysis: Secondary | ICD-10-CM | POA: Diagnosis not present

## 2018-07-12 DIAGNOSIS — N186 End stage renal disease: Secondary | ICD-10-CM | POA: Diagnosis not present

## 2018-07-12 DIAGNOSIS — Z992 Dependence on renal dialysis: Secondary | ICD-10-CM | POA: Diagnosis not present

## 2018-07-15 DIAGNOSIS — Z992 Dependence on renal dialysis: Secondary | ICD-10-CM | POA: Diagnosis not present

## 2018-07-15 DIAGNOSIS — N186 End stage renal disease: Secondary | ICD-10-CM | POA: Diagnosis not present

## 2018-07-17 DIAGNOSIS — N186 End stage renal disease: Secondary | ICD-10-CM | POA: Diagnosis not present

## 2018-07-17 DIAGNOSIS — Z992 Dependence on renal dialysis: Secondary | ICD-10-CM | POA: Diagnosis not present

## 2018-07-19 DIAGNOSIS — Z992 Dependence on renal dialysis: Secondary | ICD-10-CM | POA: Diagnosis not present

## 2018-07-19 DIAGNOSIS — N186 End stage renal disease: Secondary | ICD-10-CM | POA: Diagnosis not present

## 2018-07-22 DIAGNOSIS — Z992 Dependence on renal dialysis: Secondary | ICD-10-CM | POA: Diagnosis not present

## 2018-07-22 DIAGNOSIS — N186 End stage renal disease: Secondary | ICD-10-CM | POA: Diagnosis not present

## 2018-07-24 DIAGNOSIS — Z992 Dependence on renal dialysis: Secondary | ICD-10-CM | POA: Diagnosis not present

## 2018-07-24 DIAGNOSIS — N186 End stage renal disease: Secondary | ICD-10-CM | POA: Diagnosis not present

## 2018-07-26 DIAGNOSIS — Z992 Dependence on renal dialysis: Secondary | ICD-10-CM | POA: Diagnosis not present

## 2018-07-26 DIAGNOSIS — N186 End stage renal disease: Secondary | ICD-10-CM | POA: Diagnosis not present

## 2018-07-26 DIAGNOSIS — Z940 Kidney transplant status: Secondary | ICD-10-CM | POA: Diagnosis not present

## 2018-07-29 DIAGNOSIS — Z992 Dependence on renal dialysis: Secondary | ICD-10-CM | POA: Diagnosis not present

## 2018-07-29 DIAGNOSIS — N186 End stage renal disease: Secondary | ICD-10-CM | POA: Diagnosis not present

## 2018-07-31 DIAGNOSIS — N186 End stage renal disease: Secondary | ICD-10-CM | POA: Diagnosis not present

## 2018-07-31 DIAGNOSIS — Z992 Dependence on renal dialysis: Secondary | ICD-10-CM | POA: Diagnosis not present

## 2018-08-02 DIAGNOSIS — Z992 Dependence on renal dialysis: Secondary | ICD-10-CM | POA: Diagnosis not present

## 2018-08-02 DIAGNOSIS — N186 End stage renal disease: Secondary | ICD-10-CM | POA: Diagnosis not present

## 2018-08-04 DIAGNOSIS — Z992 Dependence on renal dialysis: Secondary | ICD-10-CM | POA: Diagnosis not present

## 2018-08-04 DIAGNOSIS — N186 End stage renal disease: Secondary | ICD-10-CM | POA: Diagnosis not present

## 2018-08-05 DIAGNOSIS — N186 End stage renal disease: Secondary | ICD-10-CM | POA: Diagnosis not present

## 2018-08-05 DIAGNOSIS — Z992 Dependence on renal dialysis: Secondary | ICD-10-CM | POA: Diagnosis not present

## 2018-08-07 DIAGNOSIS — Z992 Dependence on renal dialysis: Secondary | ICD-10-CM | POA: Diagnosis not present

## 2018-08-07 DIAGNOSIS — N186 End stage renal disease: Secondary | ICD-10-CM | POA: Diagnosis not present

## 2018-08-09 DIAGNOSIS — N186 End stage renal disease: Secondary | ICD-10-CM | POA: Diagnosis not present

## 2018-08-09 DIAGNOSIS — Z992 Dependence on renal dialysis: Secondary | ICD-10-CM | POA: Diagnosis not present

## 2018-08-11 ENCOUNTER — Other Ambulatory Visit: Payer: Self-pay | Admitting: Family

## 2018-08-11 DIAGNOSIS — Z794 Long term (current) use of insulin: Secondary | ICD-10-CM

## 2018-08-11 DIAGNOSIS — E1165 Type 2 diabetes mellitus with hyperglycemia: Secondary | ICD-10-CM

## 2018-08-12 DIAGNOSIS — N186 End stage renal disease: Secondary | ICD-10-CM | POA: Diagnosis not present

## 2018-08-12 DIAGNOSIS — Z992 Dependence on renal dialysis: Secondary | ICD-10-CM | POA: Diagnosis not present

## 2018-08-14 DIAGNOSIS — N186 End stage renal disease: Secondary | ICD-10-CM | POA: Diagnosis not present

## 2018-08-14 DIAGNOSIS — Z992 Dependence on renal dialysis: Secondary | ICD-10-CM | POA: Diagnosis not present

## 2018-08-16 DIAGNOSIS — N186 End stage renal disease: Secondary | ICD-10-CM | POA: Diagnosis not present

## 2018-08-16 DIAGNOSIS — Z992 Dependence on renal dialysis: Secondary | ICD-10-CM | POA: Diagnosis not present

## 2018-08-19 DIAGNOSIS — N186 End stage renal disease: Secondary | ICD-10-CM | POA: Diagnosis not present

## 2018-08-19 DIAGNOSIS — Z992 Dependence on renal dialysis: Secondary | ICD-10-CM | POA: Diagnosis not present

## 2018-08-21 DIAGNOSIS — Z992 Dependence on renal dialysis: Secondary | ICD-10-CM | POA: Diagnosis not present

## 2018-08-21 DIAGNOSIS — N186 End stage renal disease: Secondary | ICD-10-CM | POA: Diagnosis not present

## 2018-08-23 DIAGNOSIS — Z992 Dependence on renal dialysis: Secondary | ICD-10-CM | POA: Diagnosis not present

## 2018-08-23 DIAGNOSIS — N186 End stage renal disease: Secondary | ICD-10-CM | POA: Diagnosis not present

## 2018-08-25 ENCOUNTER — Other Ambulatory Visit: Payer: Self-pay | Admitting: Family

## 2018-08-25 DIAGNOSIS — E1159 Type 2 diabetes mellitus with other circulatory complications: Secondary | ICD-10-CM

## 2018-08-25 DIAGNOSIS — I152 Hypertension secondary to endocrine disorders: Secondary | ICD-10-CM

## 2018-08-26 DIAGNOSIS — Z992 Dependence on renal dialysis: Secondary | ICD-10-CM | POA: Diagnosis not present

## 2018-08-26 DIAGNOSIS — N186 End stage renal disease: Secondary | ICD-10-CM | POA: Diagnosis not present

## 2018-08-26 NOTE — Telephone Encounter (Signed)
NOV 10/01/18

## 2018-08-28 DIAGNOSIS — Z992 Dependence on renal dialysis: Secondary | ICD-10-CM | POA: Diagnosis not present

## 2018-08-28 DIAGNOSIS — N186 End stage renal disease: Secondary | ICD-10-CM | POA: Diagnosis not present

## 2018-08-30 DIAGNOSIS — Z992 Dependence on renal dialysis: Secondary | ICD-10-CM | POA: Diagnosis not present

## 2018-08-30 DIAGNOSIS — N186 End stage renal disease: Secondary | ICD-10-CM | POA: Diagnosis not present

## 2018-09-02 DIAGNOSIS — Z992 Dependence on renal dialysis: Secondary | ICD-10-CM | POA: Diagnosis not present

## 2018-09-02 DIAGNOSIS — N186 End stage renal disease: Secondary | ICD-10-CM | POA: Diagnosis not present

## 2018-09-04 DIAGNOSIS — Z992 Dependence on renal dialysis: Secondary | ICD-10-CM | POA: Diagnosis not present

## 2018-09-04 DIAGNOSIS — N186 End stage renal disease: Secondary | ICD-10-CM | POA: Diagnosis not present

## 2018-09-05 DIAGNOSIS — Z4822 Encounter for aftercare following kidney transplant: Secondary | ICD-10-CM | POA: Diagnosis not present

## 2018-09-06 DIAGNOSIS — N186 End stage renal disease: Secondary | ICD-10-CM | POA: Diagnosis not present

## 2018-09-06 DIAGNOSIS — Z992 Dependence on renal dialysis: Secondary | ICD-10-CM | POA: Diagnosis not present

## 2018-09-08 ENCOUNTER — Other Ambulatory Visit: Payer: Self-pay | Admitting: Family

## 2018-09-08 DIAGNOSIS — E1165 Type 2 diabetes mellitus with hyperglycemia: Secondary | ICD-10-CM

## 2018-09-09 DIAGNOSIS — N186 End stage renal disease: Secondary | ICD-10-CM | POA: Diagnosis not present

## 2018-09-09 DIAGNOSIS — Z992 Dependence on renal dialysis: Secondary | ICD-10-CM | POA: Diagnosis not present

## 2018-09-11 DIAGNOSIS — N186 End stage renal disease: Secondary | ICD-10-CM | POA: Diagnosis not present

## 2018-09-11 DIAGNOSIS — Z992 Dependence on renal dialysis: Secondary | ICD-10-CM | POA: Diagnosis not present

## 2018-09-13 DIAGNOSIS — N186 End stage renal disease: Secondary | ICD-10-CM | POA: Diagnosis not present

## 2018-09-13 DIAGNOSIS — Z992 Dependence on renal dialysis: Secondary | ICD-10-CM | POA: Diagnosis not present

## 2018-09-16 DIAGNOSIS — N186 End stage renal disease: Secondary | ICD-10-CM | POA: Diagnosis not present

## 2018-09-16 DIAGNOSIS — Z992 Dependence on renal dialysis: Secondary | ICD-10-CM | POA: Diagnosis not present

## 2018-09-18 DIAGNOSIS — Z992 Dependence on renal dialysis: Secondary | ICD-10-CM | POA: Diagnosis not present

## 2018-09-18 DIAGNOSIS — N186 End stage renal disease: Secondary | ICD-10-CM | POA: Diagnosis not present

## 2018-09-20 DIAGNOSIS — N186 End stage renal disease: Secondary | ICD-10-CM | POA: Diagnosis not present

## 2018-09-20 DIAGNOSIS — Z992 Dependence on renal dialysis: Secondary | ICD-10-CM | POA: Diagnosis not present

## 2018-09-23 DIAGNOSIS — N186 End stage renal disease: Secondary | ICD-10-CM | POA: Diagnosis not present

## 2018-09-23 DIAGNOSIS — Z992 Dependence on renal dialysis: Secondary | ICD-10-CM | POA: Diagnosis not present

## 2018-09-25 DIAGNOSIS — N186 End stage renal disease: Secondary | ICD-10-CM | POA: Diagnosis not present

## 2018-09-25 DIAGNOSIS — Z992 Dependence on renal dialysis: Secondary | ICD-10-CM | POA: Diagnosis not present

## 2018-09-27 DIAGNOSIS — Z992 Dependence on renal dialysis: Secondary | ICD-10-CM | POA: Diagnosis not present

## 2018-09-27 DIAGNOSIS — N186 End stage renal disease: Secondary | ICD-10-CM | POA: Diagnosis not present

## 2018-09-30 ENCOUNTER — Other Ambulatory Visit: Payer: Self-pay

## 2018-09-30 DIAGNOSIS — N186 End stage renal disease: Secondary | ICD-10-CM | POA: Diagnosis not present

## 2018-09-30 DIAGNOSIS — Z992 Dependence on renal dialysis: Secondary | ICD-10-CM | POA: Diagnosis not present

## 2018-10-01 ENCOUNTER — Other Ambulatory Visit: Payer: Self-pay | Admitting: Family

## 2018-10-01 ENCOUNTER — Encounter: Payer: Self-pay | Admitting: Family

## 2018-10-01 ENCOUNTER — Ambulatory Visit (INDEPENDENT_AMBULATORY_CARE_PROVIDER_SITE_OTHER): Payer: BC Managed Care – PPO | Admitting: Family

## 2018-10-01 VITALS — BP 142/61 | HR 58 | Temp 97.1°F | Ht 69.0 in | Wt 154.2 lb

## 2018-10-01 DIAGNOSIS — I10 Essential (primary) hypertension: Secondary | ICD-10-CM

## 2018-10-01 DIAGNOSIS — E1165 Type 2 diabetes mellitus with hyperglycemia: Secondary | ICD-10-CM

## 2018-10-01 DIAGNOSIS — E1159 Type 2 diabetes mellitus with other circulatory complications: Secondary | ICD-10-CM | POA: Diagnosis not present

## 2018-10-01 DIAGNOSIS — N186 End stage renal disease: Secondary | ICD-10-CM

## 2018-10-01 DIAGNOSIS — E785 Hyperlipidemia, unspecified: Secondary | ICD-10-CM

## 2018-10-01 DIAGNOSIS — Z992 Dependence on renal dialysis: Secondary | ICD-10-CM | POA: Diagnosis not present

## 2018-10-01 DIAGNOSIS — D638 Anemia in other chronic diseases classified elsewhere: Secondary | ICD-10-CM

## 2018-10-01 DIAGNOSIS — I5032 Chronic diastolic (congestive) heart failure: Secondary | ICD-10-CM

## 2018-10-01 DIAGNOSIS — I152 Hypertension secondary to endocrine disorders: Secondary | ICD-10-CM

## 2018-10-01 DIAGNOSIS — E1169 Type 2 diabetes mellitus with other specified complication: Secondary | ICD-10-CM

## 2018-10-01 LAB — BAYER DCA HB A1C WAIVED: HB A1C (BAYER DCA - WAIVED): 9.7 % — ABNORMAL HIGH (ref <7.0)

## 2018-10-01 NOTE — Patient Instructions (Signed)
Health Maintenance After Age 65 After age 64, you are at a higher risk for certain long-term diseases and infections as well as injuries from falls. Falls are a major cause of broken bones and head injuries in people who are older than age 64. Getting regular preventive care can help to keep you healthy and well. Preventive care includes getting regular testing and making lifestyle changes as recommended by your health care provider. Talk with your health care provider about:  Which screenings and tests you should have. A screening is a test that checks for a disease when you have no symptoms.  A diet and exercise plan that is right for you. What should I know about screenings and tests to prevent falls? Screening and testing are the best ways to find a health problem early. Early diagnosis and treatment give you the best chance of managing medical conditions that are common after age 64. Certain conditions and lifestyle choices may make you more likely to have a fall. Your health care provider may recommend:  Regular vision checks. Poor vision and conditions such as cataracts can make you more likely to have a fall. If you wear glasses, make sure to get your prescription updated if your vision changes.  Medicine review. Work with your health care provider to regularly review all of the medicines you are taking, including over-the-counter medicines. Ask your health care provider about any side effects that may make you more likely to have a fall. Tell your health care provider if any medicines that you take make you feel dizzy or sleepy.  Osteoporosis screening. Osteoporosis is a condition that causes the bones to get weaker. This can make the bones weak and cause them to break more easily.  Blood pressure screening. Blood pressure changes and medicines to control blood pressure can make you feel dizzy.  Strength and balance checks. Your health care provider may recommend certain tests to check your  strength and balance while standing, walking, or changing positions.  Foot health exam. Foot pain and numbness, as well as not wearing proper footwear, can make you more likely to have a fall.  Depression screening. You may be more likely to have a fall if you have a fear of falling, feel emotionally low, or feel unable to do activities that you used to do.  Alcohol use screening. Using too much alcohol can affect your balance and may make you more likely to have a fall. What actions can I take to lower my risk of falls? General instructions  Talk with your health care provider about your risks for falling. Tell your health care provider if: ? You fall. Be sure to tell your health care provider about all falls, even ones that seem minor. ? You feel dizzy, sleepy, or off-balance.  Take over-the-counter and prescription medicines only as told by your health care provider. These include any supplements.  Eat a healthy diet and maintain a healthy weight. A healthy diet includes low-fat dairy products, low-fat (lean) meats, and fiber from whole grains, beans, and lots of fruits and vegetables. Home safety  Remove any tripping hazards, such as rugs, cords, and clutter.  Install safety equipment such as grab bars in bathrooms and safety rails on stairs.  Keep rooms and walkways well-lit. Activity   Follow a regular exercise program to stay fit. This will help you maintain your balance. Ask your health care provider what types of exercise are appropriate for you.  If you need a cane or   walker, use it as recommended by your health care provider.  Wear supportive shoes that have nonskid soles. Lifestyle  Do not drink alcohol if your health care provider tells you not to drink.  If you drink alcohol, limit how much you have: ? 0-1 drink a day for women. ? 0-2 drinks a day for men.  Be aware of how much alcohol is in your drink. In the [place] one drink equals one typical bottle of beer (12  oz), one-half glass of wine (5 oz), or one shot of hard liquor (1 oz).  Do not use any products that contain nicotine or tobacco, such as cigarettes and e-cigarettes. If you need help quitting, ask your health care provider. Summary  Having a healthy lifestyle and getting preventive care can help to protect your health and wellness after age 64.  Screening and testing are the best way to find a health problem early and help you avoid having a fall. Early diagnosis and treatment give you the best chance for managing medical conditions that are more common for people who are older than age 64.  Falls are a major cause of broken bones and head injuries in people who are older than age 64. Take precautions to prevent a fall at home.  Work with your health care provider to learn what changes you can make to improve your health and wellness and to prevent falls. This information is not intended to replace advice given to you by your health care provider. Make sure you discuss any questions you have with your health care provider. Document Released: 01/03/2017 Document Revised: 06/13/2018 Document Reviewed: 01/03/2017 Elsevier Patient Education  2020 [employer]  

## 2018-10-01 NOTE — Progress Notes (Signed)
Subjective:    Patient ID: [name], female    DOB: [birth year], 63 y.o.   MRN: [id number]  Chief Complaint  Patient presents with  . Medical Management of Chronic Issues    wants 90 day supply on trulicity  . Diabetes   PT presents to the office todayfor chronic follow up.Pt is followed by Cardiologists annually for CHF and aortic valve sclerosis that is stable.   Pt currently on dialysis Monday, Wednesday, and Friday and followed by nephrologists there. 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 Diabetes She presents for her follow-up diabetic visit. She has type 2 diabetes mellitus. Her disease course has been stable. There are no hypoglycemic associated symptoms. Associated symptoms include fatigue. Pertinent negatives for diabetes include no blurred vision and no foot paresthesias. There are no hypoglycemic complications. Symptoms are stable. Diabetic complications include heart disease and nephropathy. Risk factors for coronary artery disease include dyslipidemia, diabetes mellitus, hypertension, sedentary lifestyle and post-menopausal. She is following a generally healthy diet. Her overall blood glucose range is 90-110 mg/dl. Eye exam is current.  Hypertension This is a chronic problem. The current episode started more than 1 year ago. The problem has been waxing and waning since onset. The problem is uncontrolled. Associated symptoms include malaise/fatigue. Pertinent negatives include no blurred vision, peripheral edema or shortness of breath. Risk factors for coronary artery disease include dyslipidemia and sedentary lifestyle. The current treatment provides moderate improvement. Hypertensive end-organ damage includes kidney disease and CAD/MI.  Congestive Heart Failure Presents for follow-up visit. Associated symptoms include fatigue. Pertinent negatives include no shortness of breath. The symptoms have been stable.  Hyperlipidemia This is a chronic problem. The  current episode started more than 1 year ago. The problem is controlled. Recent lipid tests were reviewed and are normal. Pertinent negatives include no shortness of breath. Current antihyperlipidemic treatment includes statins. The current treatment provides moderate improvement of lipids. Risk factors for coronary artery disease include dyslipidemia, hypertension, a sedentary lifestyle and post-menopausal.      Review of Systems  Constitutional: Positive for fatigue and malaise/fatigue.  Eyes: Negative for blurred vision.  Respiratory: Negative for shortness of breath.   All other systems reviewed and are negative.      Objective:   Physical Exam Vitals signs reviewed.  Constitutional:      General: She is not in acute distress.    Appearance: She is well-developed.  HENT:     Head: Normocephalic and atraumatic.     Right Ear: Tympanic membrane normal.     Left Ear: Tympanic membrane normal.  Eyes:     Pupils: Pupils are equal, round, and reactive to light.  Neck:     Musculoskeletal: Normal range of motion and neck supple.     Thyroid: No thyromegaly.  Cardiovascular:     Rate and Rhythm: Normal rate and regular rhythm.     Heart sounds: Murmur present.  Pulmonary:     Effort: Pulmonary effort is normal. No respiratory distress.     Breath sounds: Normal breath sounds. No wheezing.  Abdominal:     General: Bowel sounds are normal. There is no distension.     Palpations: Abdomen is soft.     Tenderness: There is no abdominal tenderness.  Musculoskeletal: Normal range of motion.        General: No tenderness.  Skin:    General: Skin is warm and dry.  Neurological:     Mental Status: She is alert and oriented  to person, place, and time.     Cranial Nerves: No cranial nerve deficit.     Deep Tendon Reflexes: Reflexes are normal and symmetric.  Psychiatric:        Behavior: Behavior normal.        Thought Content: Thought content normal.        Judgment: Judgment  normal.       BP (!) 164/61   Pulse (!) 55   Temp (!) 97.1 F (36.2 C) (Oral)   Ht _0  (1.753 m)   Wt 154 lb 3.2 oz (69.9 kg)   BMI 22.77 kg/m      Assessment & Plan:  [name] comes in today with chief complaint of Medical Management of Chronic Issues (wants 90 day supply on trulicity) and Diabetes   Diagnosis and orders addressed:  1. Hypertension associated with diabetes ([place]) - CMP14+EGFR  2. Chronic diastolic heart failure (HCC) - CMP14+EGFR  3. Type 2 diabetes mellitus with other specified complication, without long-term current use of insulin (HCC) - CMP14+EGFR - Bayer DCA Hb A1c Waived  4. Hyperlipidemia associated with type 2 diabetes mellitus (HCC) - CMP14+EGFR  5. ESRD (end stage renal disease) ([place]) - CMP14+EGFR  6. 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 Anemia of chronic disease - CMP14+EGFR - Anemia Profile B   Labs pending Health Maintenance reviewed Diet and exercise encouraged  Follow up plan: 6 months    Evelina Dun, FNP

## 2018-10-02 ENCOUNTER — Other Ambulatory Visit: Payer: Self-pay | Admitting: Family

## 2018-10-02 DIAGNOSIS — Z992 Dependence on renal dialysis: Secondary | ICD-10-CM | POA: Diagnosis not present

## 2018-10-02 DIAGNOSIS — N186 End stage renal disease: Secondary | ICD-10-CM | POA: Diagnosis not present

## 2018-10-02 LAB — ANEMIA PROFILE B
Basophils Absolute: 0.1 10*3/uL (ref 0.0–0.2)
Basos: 1 %
EOS (ABSOLUTE): 0.3 10*3/uL (ref 0.0–0.4)
Eos: 5 %
Ferritin: 1232 ng/mL — ABNORMAL HIGH (ref 15–150)
Folate: 19.3 ng/mL (ref >3.0)
Hematocrit: 36.6 % (ref 34.0–46.6)
Hemoglobin: 11.8 g/dL (ref 11.1–15.9)
Immature Grans (Abs): 0 10*3/uL (ref 0.0–0.1)
Immature Granulocytes: 0 %
Iron Saturation: 31 % (ref 15–55)
Iron: 73 ug/dL (ref 27–139)
Lymphocytes Absolute: 0.7 10*3/uL (ref 0.7–3.1)
Lymphs: 11 %
MCH: 31.6 pg (ref 26.6–33.0)
MCHC: 32.2 g/dL (ref 31.5–35.7)
MCV: 98 fL — ABNORMAL HIGH (ref 79–97)
Monocytes Absolute: 0.6 10*3/uL (ref 0.1–0.9)
Monocytes: 8 %
Neutrophils Absolute: 5.3 10*3/uL (ref 1.4–7.0)
Neutrophils: 75 %
Platelets: 165 10*3/uL (ref 150–450)
RBC: 3.73 x10E6/uL — ABNORMAL LOW (ref 3.77–5.28)
RDW: 12.8 % (ref 11.7–15.4)
Retic Ct Pct: 1.8 % (ref 0.6–2.6)
Total Iron Binding Capacity: 238 ug/dL — ABNORMAL LOW (ref 250–450)
UIBC: 165 ug/dL (ref 118–369)
Vitamin B-12: 782 pg/mL (ref 232–1245)
WBC: 6.9 10*3/uL (ref 3.4–10.8)

## 2018-10-02 LAB — CMP14+EGFR
ALT: 42 IU/L — ABNORMAL HIGH (ref 0–32)
AST: 37 IU/L (ref 0–40)
Albumin/Globulin Ratio: 1.3 (ref 1.2–2.2)
Albumin: 3.9 g/dL (ref 3.8–4.8)
Alkaline Phosphatase: 99 IU/L (ref 39–117)
BUN/Creatinine Ratio: 6 — ABNORMAL LOW (ref 12–28)
BUN: 30 mg/dL — ABNORMAL HIGH (ref 8–27)
Bilirubin Total: 0.5 mg/dL (ref 0.0–1.2)
CO2: 23 mmol/L (ref 20–29)
Calcium: 8.5 mg/dL — ABNORMAL LOW (ref 8.7–10.3)
Chloride: 92 mmol/L — ABNORMAL LOW (ref 96–106)
Creatinine, Ser: 5.09 mg/dL (ref 0.57–1.00)
GFR calc Af Amer: 10 mL/min/{1.73_m2} — ABNORMAL LOW (ref >59)
GFR calc non Af Amer: 8 mL/min/{1.73_m2} — ABNORMAL LOW (ref >59)
Globulin, Total: 3.1 g/dL (ref 1.5–4.5)
Glucose: 218 mg/dL — ABNORMAL HIGH (ref 65–99)
Potassium: 4.5 mmol/L (ref 3.5–5.2)
Sodium: 136 mmol/L (ref 134–144)
Total Protein: 7 g/dL (ref 6.0–8.5)

## 2018-10-03 ENCOUNTER — Other Ambulatory Visit: Payer: Self-pay | Admitting: Family

## 2018-10-04 DIAGNOSIS — N186 End stage renal disease: Secondary | ICD-10-CM | POA: Diagnosis not present

## 2018-10-04 DIAGNOSIS — Z992 Dependence on renal dialysis: Secondary | ICD-10-CM | POA: Diagnosis not present

## 2018-10-07 DIAGNOSIS — Z992 Dependence on renal dialysis: Secondary | ICD-10-CM | POA: Diagnosis not present

## 2018-10-07 DIAGNOSIS — N186 End stage renal disease: Secondary | ICD-10-CM | POA: Diagnosis not present

## 2018-10-09 DIAGNOSIS — N186 End stage renal disease: Secondary | ICD-10-CM | POA: Diagnosis not present

## 2018-10-09 DIAGNOSIS — Z992 Dependence on renal dialysis: Secondary | ICD-10-CM | POA: Diagnosis not present

## 2018-10-11 DIAGNOSIS — Z992 Dependence on renal dialysis: Secondary | ICD-10-CM | POA: Diagnosis not present

## 2018-10-11 DIAGNOSIS — N186 End stage renal disease: Secondary | ICD-10-CM | POA: Diagnosis not present

## 2018-10-14 DIAGNOSIS — N186 End stage renal disease: Secondary | ICD-10-CM | POA: Diagnosis not present

## 2018-10-14 DIAGNOSIS — Z992 Dependence on renal dialysis: Secondary | ICD-10-CM | POA: Diagnosis not present

## 2018-10-16 DIAGNOSIS — N186 End stage renal disease: Secondary | ICD-10-CM | POA: Diagnosis not present

## 2018-10-16 DIAGNOSIS — Z992 Dependence on renal dialysis: Secondary | ICD-10-CM | POA: Diagnosis not present

## 2018-10-17 ENCOUNTER — Encounter: Payer: Self-pay | Admitting: Family

## 2018-10-17 ENCOUNTER — Ambulatory Visit (INDEPENDENT_AMBULATORY_CARE_PROVIDER_SITE_OTHER): Payer: BC Managed Care – PPO | Admitting: Family

## 2018-10-17 DIAGNOSIS — R21 Rash and other nonspecific skin eruption: Secondary | ICD-10-CM

## 2018-10-17 MED ORDER — TRIAMCINOLONE ACETONIDE 0.5 % EX OINT: 1.0000 "application " | TOPICAL_OINTMENT | Freq: Two times a day (BID) | CUTANEOUS | 0 refills | Status: DC

## 2018-10-17 NOTE — Progress Notes (Signed)
   Virtual Visit via telephone Note Due to COVID-19 pandemic this visit was conducted virtually. This visit type was conducted due to national recommendations for restrictions regarding the COVID-19 Pandemic (e.g. social distancing, sheltering in place) in an effort to limit this patient's exposure and mitigate transmission in our community. All issues noted in this document were discussed and addressed.  A physical exam was not performed with this format.  I connected with [name] on 10/17/18 at 8:50 AM  by telephone and verified that I am speaking with the correct person using two identifiers. [name] is currently located at home and no one is currently with her during visit. The provider, Evelina Dun, FNP is located in their office at time of visit.  I discussed the limitations, risks, security and privacy concerns of performing an evaluation and management service by telephone and the availability of in person appointments. I also discussed with the patient that there may be a patient responsible charge related to this service. The patient expressed understanding and agreed to proceed.   History and Present Illness:  Rash This is a new problem. The current episode started in the past 7 days. The problem has been gradually worsening since onset. The affected locations include the left upper leg, left lower leg, right upper leg and right lower leg. The rash is characterized by itchiness and pain. It is unknown if there was an exposure to a precipitant. Pertinent negatives include no congestion, cough, diarrhea, fatigue, fever, joint pain, shortness of breath or sore throat. Past treatments include anti-itch cream and topical steroids. The treatment provided mild relief.      Review of Systems  Constitutional: Negative for fatigue and fever.  HENT: Negative for congestion and sore throat.   Respiratory: Negative for cough and shortness of breath.   Gastrointestinal: Negative for  diarrhea.  Musculoskeletal: Negative for joint pain.  Skin: Positive for rash.  All other systems reviewed and are negative.    Observations/Objective: No SOB or distress noted  Assessment and Plan: 1. Rash and nonspecific skin eruption Do not scratch Keep clean and dry Call office if rash does not improve or worsens  RTO any s/s of erythemas, swelling, or drainage  - triamcinolone ointment (KENALOG) 0.5 %; Apply 1 application topically 2 (two) times daily.  Dispense: 60 g; Refill: 0    I discussed the assessment and treatment plan with the patient. The patient was provided an opportunity to ask questions and all were answered. The patient agreed with the plan and demonstrated an understanding of the instructions.   The patient was advised to call back or seek an in-person evaluation if the symptoms worsen or if the condition fails to improve as anticipated.  The above assessment and management plan was discussed with the patient. The patient verbalized understanding of and has agreed to the management plan. Patient is aware to call the clinic if symptoms persist or worsen. Patient is aware when to return to the clinic for a follow-up visit. Patient educated on when it is appropriate to go to the emergency department.   Time call ended:  9:03 AM   I provided 13 minutes of non-face-to-face time during this encounter.    Evelina Dun, FNP

## 2018-10-18 DIAGNOSIS — N186 End stage renal disease: Secondary | ICD-10-CM | POA: Diagnosis not present

## 2018-10-18 DIAGNOSIS — Z992 Dependence on renal dialysis: Secondary | ICD-10-CM | POA: Diagnosis not present

## 2018-10-21 DIAGNOSIS — Z992 Dependence on renal dialysis: Secondary | ICD-10-CM | POA: Diagnosis not present

## 2018-10-21 DIAGNOSIS — N186 End stage renal disease: Secondary | ICD-10-CM | POA: Diagnosis not present

## 2018-10-23 DIAGNOSIS — Z992 Dependence on renal dialysis: Secondary | ICD-10-CM | POA: Diagnosis not present

## 2018-10-23 DIAGNOSIS — N186 End stage renal disease: Secondary | ICD-10-CM | POA: Diagnosis not present

## 2018-10-25 DIAGNOSIS — Z992 Dependence on renal dialysis: Secondary | ICD-10-CM | POA: Diagnosis not present

## 2018-10-25 DIAGNOSIS — N186 End stage renal disease: Secondary | ICD-10-CM | POA: Diagnosis not present

## 2018-10-27 ENCOUNTER — Other Ambulatory Visit: Payer: Self-pay | Admitting: Family

## 2018-10-27 DIAGNOSIS — E785 Hyperlipidemia, unspecified: Secondary | ICD-10-CM

## 2018-10-27 DIAGNOSIS — E1169 Type 2 diabetes mellitus with other specified complication: Secondary | ICD-10-CM

## 2018-10-28 DIAGNOSIS — Z992 Dependence on renal dialysis: Secondary | ICD-10-CM | POA: Diagnosis not present

## 2018-10-28 DIAGNOSIS — N186 End stage renal disease: Secondary | ICD-10-CM | POA: Diagnosis not present

## 2018-10-30 DIAGNOSIS — N186 End stage renal disease: Secondary | ICD-10-CM | POA: Diagnosis not present

## 2018-10-30 DIAGNOSIS — Z992 Dependence on renal dialysis: Secondary | ICD-10-CM | POA: Diagnosis not present

## 2018-11-01 DIAGNOSIS — N186 End stage renal disease: Secondary | ICD-10-CM | POA: Diagnosis not present

## 2018-11-01 DIAGNOSIS — Z992 Dependence on renal dialysis: Secondary | ICD-10-CM | POA: Diagnosis not present

## 2018-11-04 ENCOUNTER — Other Ambulatory Visit: Payer: Self-pay

## 2018-11-04 DIAGNOSIS — Z992 Dependence on renal dialysis: Secondary | ICD-10-CM | POA: Diagnosis not present

## 2018-11-04 DIAGNOSIS — N186 End stage renal disease: Secondary | ICD-10-CM | POA: Diagnosis not present

## 2018-11-05 ENCOUNTER — Ambulatory Visit (INDEPENDENT_AMBULATORY_CARE_PROVIDER_SITE_OTHER): Payer: BC Managed Care – PPO | Admitting: Family

## 2018-11-05 ENCOUNTER — Encounter: Payer: Self-pay | Admitting: Family

## 2018-11-05 VITALS — BP 162/65 | HR 59 | Temp 97.5°F | Ht 69.0 in | Wt 154.0 lb

## 2018-11-05 DIAGNOSIS — Z794 Long term (current) use of insulin: Secondary | ICD-10-CM | POA: Diagnosis not present

## 2018-11-05 DIAGNOSIS — E1165 Type 2 diabetes mellitus with hyperglycemia: Secondary | ICD-10-CM | POA: Diagnosis not present

## 2018-11-05 DIAGNOSIS — E1169 Type 2 diabetes mellitus with other specified complication: Secondary | ICD-10-CM

## 2018-11-05 MED ORDER — TRESIBA FLEXTOUCH 100 UNIT/ML ~~LOC~~ SOPN: 10.0000 [IU] | PEN_INJECTOR | Freq: Every day | SUBCUTANEOUS | 2 refills | Status: DC

## 2018-11-05 NOTE — Progress Notes (Signed)
Subjective:    Patient ID: MEDORA ROORDA, female    DOB: [birth year], 63 y.o.   MRN: [id number]  Chief Complaint  Patient presents with  . Diabetes    Pt presents to the office today to recheck her DM. She was seen last month on 10/01/18 and her A1C was 9.7. Diabetes She presents for her follow-up diabetic visit. She has type 2 diabetes mellitus. There are no hypoglycemic associated symptoms. Associated symptoms include foot paresthesias. Pertinent negatives for diabetes include no blurred vision and no visual change. Symptoms are improving. Diabetic complications include heart disease, nephropathy and peripheral neuropathy. Risk factors for coronary artery disease include dyslipidemia, diabetes mellitus, hypertension, sedentary lifestyle and post-menopausal. She is following a generally healthy diet. Her overall blood glucose range is 110-130 mg/dl. Eye exam is current.      Review of Systems  Eyes: Negative for blurred vision.  All other systems reviewed and are negative.      Objective:   Physical Exam Vitals signs reviewed.  Constitutional:      General: She is not in acute distress.    Appearance: She is well-developed.  HENT:     Head: Normocephalic and atraumatic.     Right Ear: Tympanic membrane normal.     Left Ear: Tympanic membrane normal.  Eyes:     Pupils: Pupils are equal, round, and reactive to light.  Neck:     Musculoskeletal: Normal range of motion and neck supple.     Thyroid: No thyromegaly.  Cardiovascular:     Rate and Rhythm: Normal rate and regular rhythm.     Heart sounds: Murmur present.  Pulmonary:     Effort: Pulmonary effort is normal. No respiratory distress.     Breath sounds: Normal breath sounds. No wheezing.  Abdominal:     General: Bowel sounds are normal. There is no distension.     Palpations: Abdomen is soft.     Tenderness: There is no abdominal tenderness.  Musculoskeletal: Normal range of motion.        General: No tenderness.   Skin:    General: Skin is warm and dry.  Neurological:     Mental Status: She is alert and oriented to person, place, and time.     Cranial Nerves: No cranial nerve deficit.     Deep Tendon Reflexes: Reflexes are normal and symmetric.  Psychiatric:        Behavior: Behavior normal.        Thought Content: Thought content normal.        Judgment: Judgment normal.       BP (!) 162/65   Pulse (!) 59   Temp (!) 97.5 F (36.4 C) (Oral)   Ht _0  (1.753 m)   Wt 154 lb (69.9 kg)   BMI 22.74 kg/m      Assessment & Plan:  JAHYRA SUKUP comes in today with chief complaint of Diabetes   Diagnosis and orders addressed:  1. Type 2 diabetes mellitus with other specified complication, without long-term current use of insulin (HCC) - BMP8+EGFR  2. Type 2 diabetes mellitus with hyperglycemia, with long-term current use of insulin (HCC) - insulin degludec (TRESIBA FLEXTOUCH) 100 UNIT/ML SOPN FlexTouch Pen; Inject 0.1 mLs (10 Units total) into the skin daily at 10 pm.  Dispense: 12 pen; Refill: 2 - BMP8+EGFR  Continue Tresiba and Trulicity  Low carb diet  Keep dialysis appt  Follow up in 2 months  Follow up plan: 2 month  Evelina Dun, FNP

## 2018-11-05 NOTE — Patient Instructions (Signed)

## 2018-11-06 ENCOUNTER — Telehealth: Payer: Self-pay | Admitting: Family

## 2018-11-06 DIAGNOSIS — Z992 Dependence on renal dialysis: Secondary | ICD-10-CM | POA: Diagnosis not present

## 2018-11-06 DIAGNOSIS — N186 End stage renal disease: Secondary | ICD-10-CM | POA: Diagnosis not present

## 2018-11-06 LAB — BMP8+EGFR
BUN/Creatinine Ratio: 5 — ABNORMAL LOW (ref 12–28)
BUN: 27 mg/dL (ref 8–27)
CO2: 23 mmol/L (ref 20–29)
Calcium: 8.6 mg/dL — ABNORMAL LOW (ref 8.7–10.3)
Chloride: 98 mmol/L (ref 96–106)
Creatinine, Ser: 5.41 mg/dL (ref 0.57–1.00)
GFR calc Af Amer: 9 mL/min/{1.73_m2} — ABNORMAL LOW (ref >59)
GFR calc non Af Amer: 8 mL/min/{1.73_m2} — ABNORMAL LOW (ref >59)
Glucose: 126 mg/dL — ABNORMAL HIGH (ref 65–99)
Potassium: 5.9 mmol/L (ref 3.5–5.2)
Sodium: 145 mmol/L — ABNORMAL HIGH (ref 134–144)

## 2018-11-06 NOTE — Telephone Encounter (Signed)
Faxed

## 2018-11-08 ENCOUNTER — Other Ambulatory Visit: Payer: BC Managed Care – PPO

## 2018-11-08 ENCOUNTER — Other Ambulatory Visit: Payer: Self-pay | Admitting: *Deleted

## 2018-11-08 DIAGNOSIS — E875 Hyperkalemia: Secondary | ICD-10-CM

## 2018-11-08 DIAGNOSIS — N186 End stage renal disease: Secondary | ICD-10-CM | POA: Diagnosis not present

## 2018-11-08 DIAGNOSIS — Z992 Dependence on renal dialysis: Secondary | ICD-10-CM | POA: Diagnosis not present

## 2018-11-09 LAB — POTASSIUM: Potassium: 3.7 mmol/L (ref 3.5–5.2)

## 2018-11-11 DIAGNOSIS — Z992 Dependence on renal dialysis: Secondary | ICD-10-CM | POA: Diagnosis not present

## 2018-11-11 DIAGNOSIS — N186 End stage renal disease: Secondary | ICD-10-CM | POA: Diagnosis not present

## 2018-11-12 ENCOUNTER — Telehealth: Payer: Self-pay | Admitting: Family

## 2018-11-12 NOTE — Telephone Encounter (Signed)
Aware of repeat potassium - normal

## 2018-11-13 DIAGNOSIS — Z992 Dependence on renal dialysis: Secondary | ICD-10-CM | POA: Diagnosis not present

## 2018-11-13 DIAGNOSIS — N186 End stage renal disease: Secondary | ICD-10-CM | POA: Diagnosis not present

## 2018-11-15 DIAGNOSIS — N186 End stage renal disease: Secondary | ICD-10-CM | POA: Diagnosis not present

## 2018-11-15 DIAGNOSIS — Z992 Dependence on renal dialysis: Secondary | ICD-10-CM | POA: Diagnosis not present

## 2018-11-17 ENCOUNTER — Other Ambulatory Visit: Payer: Self-pay | Admitting: Family

## 2018-11-18 DIAGNOSIS — N186 End stage renal disease: Secondary | ICD-10-CM | POA: Diagnosis not present

## 2018-11-18 DIAGNOSIS — Z992 Dependence on renal dialysis: Secondary | ICD-10-CM | POA: Diagnosis not present

## 2018-11-20 ENCOUNTER — Ambulatory Visit (INDEPENDENT_AMBULATORY_CARE_PROVIDER_SITE_OTHER): Payer: BC Managed Care – PPO | Admitting: Family Medicine

## 2018-11-20 DIAGNOSIS — B3731 Acute candidiasis of vulva and vagina: Secondary | ICD-10-CM

## 2018-11-20 DIAGNOSIS — B373 Candidiasis of vulva and vagina: Secondary | ICD-10-CM | POA: Diagnosis not present

## 2018-11-20 DIAGNOSIS — Z992 Dependence on renal dialysis: Secondary | ICD-10-CM | POA: Diagnosis not present

## 2018-11-20 DIAGNOSIS — N186 End stage renal disease: Secondary | ICD-10-CM | POA: Diagnosis not present

## 2018-11-20 MED ORDER — FLUCONAZOLE 150 MG PO TABS: 150.0000 mg | ORAL_TABLET | Freq: Once | ORAL | 0 refills | Status: AC

## 2018-11-20 NOTE — Patient Instructions (Signed)
Vaginal Yeast infection, Adult  Vaginal yeast infection is a condition that causes vaginal discharge as well as soreness, swelling, and redness (inflammation) of the vagina. This is a common condition. Some women get this infection frequently. What are the causes? This condition is caused by a change in the normal balance of the yeast (candida) and bacteria that live in the vagina. This change causes an overgrowth of yeast, which causes the inflammation. What increases the risk? The condition is more likely to develop in women who:  Take antibiotic medicines.  Have diabetes.  Take birth control pills.  Are pregnant.  Douche often.  Have a weak body defense system (immune system).  Have been taking steroid medicines for a long time.  Frequently wear tight clothing. What are the signs or symptoms? Symptoms of this condition include:  White, thick, creamy vaginal discharge.  Swelling, itching, redness, and irritation of the vagina. The lips of the vagina (vulva) may be affected as well.  Pain or a burning feeling while urinating.  Pain during sex. How is this diagnosed? This condition is diagnosed based on:  Your medical history.  A physical exam.  A pelvic exam. Your health care provider will examine a sample of your vaginal discharge under a microscope. Your health care provider may send this sample for testing to confirm the diagnosis. How is this treated? This condition is treated with medicine. Medicines may be over-the-counter or prescription. You may be told to use one or more of the following:  Medicine that is taken by mouth (orally).  Medicine that is applied as a cream (topically).  Medicine that is inserted directly into the vagina (suppository). Follow these instructions at home:  Lifestyle  Do not have sex until your health care provider approves. Tell your sex partner that you have a yeast infection. That person should go to his or her health care  provider and ask if they should also be treated.  Do not wear tight clothes, such as pantyhose or tight pants.  Wear breathable cotton underwear. General instructions  Take or apply over-the-counter and prescription medicines only as told by your health care provider.  Eat more yogurt. This may help to keep your yeast infection from returning.  Do not use tampons until your health care provider approves.  Try taking a sitz bath to help with discomfort. This is a warm water bath that is taken while you are sitting down. The water should only come up to your hips and should cover your buttocks. Do this 3-4 times per day or as told by your health care provider.  Do not douche.  If you have diabetes, keep your blood sugar levels under control.  Keep all follow-up visits as told by your health care provider. This is important. Contact a health care provider if:  You have a fever.  Your symptoms go away and then return.  Your symptoms do not get better with treatment.  Your symptoms get worse.  You have new symptoms.  You develop blisters in or around your vagina.  You have blood coming from your vagina and it is not your menstrual period.  You develop pain in your abdomen. Summary  Vaginal yeast infection is a condition that causes discharge as well as soreness, swelling, and redness (inflammation) of the vagina.  This condition is treated with medicine. Medicines may be over-the-counter or prescription.  Take or apply over-the-counter and prescription medicines only as told by your health care provider.  Do not douche.   Do not have sex or use tampons until your health care provider approves.  Contact a health care provider if your symptoms do not get better with treatment or your symptoms go away and then return. This information is not intended to replace advice given to you by your health care provider. Make sure you discuss any questions you have with your health care  provider. Document Released: 11/30/2004 Document Revised: 07/09/2017 Document Reviewed: 07/09/2017 Elsevier Patient Education  2020 [employer] Inc[employer]  

## 2018-11-20 NOTE — Progress Notes (Signed)
Telephone visit  Subjective: CC: ?yeast infection PCP: Sharion Balloon, FNP RZ:[id number] [name] is a 63 y.o. female calls for telephone consult today. Patient provides verbal consent for consult held via phone.  Location of patient: home Location of provider: [facility] Others present for call: none  1. Vaginal itching She report onset of vaginal itching yesterday.  She reports mild vaginal odor. Denies dysuria.  Denies vaginal discharge or lesions.  No use of antibiotics.  No OTC products used.   ROS: Per HPI  Allergies  Allergen Reactions  . Other Other (See Comments)    Tape Bruises and tears skin, Paper tape tolerated  . Tape Other (See Comments)    Bruises and tears skin Paper tape tolerated Bruises and tears skin Paper tape tolerated Bruises and tears skin Paper tape tolerated   Past Medical History:  Diagnosis Date  . Anemia    of chronic disease  . Blood transfusion without reported diagnosis   . Cardiovascular disease    nonobstructive  . Carotid artery stenosis 2008  . CHF (congestive heart failure) ([place])   . Coronary artery disease   . Diabetes mellitus   . Dyslipidemia   . Edema of lower extremity    with hypo-albuminemia and profound protenuria  . Heart murmur   . Hypertension   . Mitral regurgitation   . Nephrotic syndrome   . Patent foramen ovale   . Pulmonary hypertension, moderate to severe ([place])   . Pulmonary nodule   . Tricuspid regurgitation   . Volume depletion, renal, due to output loss (renal deficit)     Current Outpatient Medications:  .  acetaminophen (TYLENOL) 500 MG tablet, Take 500 mg by mouth every 6 (six) hours as needed for moderate pain or headache. , Disp: , Rfl:  .  albuterol (PROVENTIL HFA;VENTOLIN HFA) 108 (90 Base) MCG/ACT inhaler, Inhale 1-2 puffs into the lungs every 6 (six) hours as needed for wheezing or shortness of breath., Disp: 1 Inhaler, Rfl: 0 .  amLODipine (NORVASC) 5 MG tablet, Take 1 tablet by mouth nightly, Disp:  90 tablet, Rfl: 0 .  aspirin (ASPIR-LOW) 81 MG EC tablet, Take 81 mg by mouth daily. , Disp: , Rfl:  .  B Complex-C-Folic Acid (RENA-VITE RX) 1 MG TABS, Take 1 tablet by mouth daily., Disp: , Rfl:  .  fluticasone (FLONASE) 50 MCG/ACT nasal spray, Place 2 sprays into both nostrils daily., Disp: 16 g, Rfl: 6 .  insulin degludec (TRESIBA FLEXTOUCH) 100 UNIT/ML SOPN FlexTouch Pen, Inject 0.1 mLs (10 Units total) into the skin daily at 10 pm., Disp: 12 pen, Rfl: 2 .  isosorbide mononitrate (IMDUR) 60 MG 24 hr tablet, Take 1 tablet by mouth once daily, Disp: 90 tablet, Rfl: 0 .  lidocaine-prilocaine (EMLA) cream, Apply 1 application topically as needed (for dialysis). , Disp: , Rfl: 0 .  metoprolol tartrate (LOPRESSOR) 25 MG tablet, Take 1/2 (one-half) tablet by mouth twice daily, Disp: 90 tablet, Rfl: 1 .  ondansetron (ZOFRAN ODT) 4 MG disintegrating tablet, 4mg  ODT q4 hours prn nausea/vomit (Patient taking differently: Take 4 mg by mouth 2 (two) times daily. 4mg  ODT q4 hours prn nausea/vomit), Disp: 12 tablet, Rfl: 0 .  predniSONE (DELTASONE) 5 MG tablet, TAKE 1 TABLET BY MOUTH ONCE DAILY WITH BREAKFAST, Disp: 90 tablet, Rfl: 0 .  PROGRAF 0.5 MG capsule, Take 0.5-1 mg by mouth See admin instructions. Take 1 mg by mouth in the morning and take 0.5 mg by mouth at bedtime, Disp: ,  Rfl: 0 .  rosuvastatin (CRESTOR) 20 MG tablet, Take 1 tablet by mouth once daily, Disp: 90 tablet, Rfl: 1 .  sevelamer carbonate (RENVELA) 800 MG tablet, Take 800 mg by mouth See admin instructions. 4 tablets with meals and 2 tablets with snacks., Disp: , Rfl:  .  triamcinolone ointment (KENALOG) 0.5 %, Apply 1 application topically 2 (two) times daily., Disp: 60 g, Rfl: 0 .  TRULICITY 1.5 0000000 SOPN, INJECT 1 DOSE (1.5 MG) SUBCUTANEOUSLY ONCE A WEEK, Disp: 12 mL, Rfl: 1  Assessment/ Plan: 63 y.o. female   1. Vaginal yeast infection Will empirically treat for vaginal yeast infection with Diflucan.  She will follow-up if  symptoms do not improve or if they get worse. - fluconazole (DIFLUCAN) 150 MG tablet; Take 1 tablet (150 mg total) by mouth once for 1 dose. Repeat dose in 3 days if needed.  Dispense: 2 tablet; Refill: 0   Start time: 1:14pm End time: 1:17pm  Total time spent on patient care (including telephone call/ virtual visit): 7 minutes  [place], [place] [phone]

## 2018-11-22 DIAGNOSIS — Z992 Dependence on renal dialysis: Secondary | ICD-10-CM | POA: Diagnosis not present

## 2018-11-22 DIAGNOSIS — N186 End stage renal disease: Secondary | ICD-10-CM | POA: Diagnosis not present

## 2018-11-24 ENCOUNTER — Other Ambulatory Visit: Payer: Self-pay | Admitting: Family

## 2018-11-24 DIAGNOSIS — I152 Hypertension secondary to endocrine disorders: Secondary | ICD-10-CM

## 2018-11-24 DIAGNOSIS — E1159 Type 2 diabetes mellitus with other circulatory complications: Secondary | ICD-10-CM

## 2018-11-25 ENCOUNTER — Other Ambulatory Visit: Payer: Self-pay

## 2018-11-25 ENCOUNTER — Ambulatory Visit (INDEPENDENT_AMBULATORY_CARE_PROVIDER_SITE_OTHER): Payer: BC Managed Care – PPO | Admitting: Family Medicine

## 2018-11-25 ENCOUNTER — Encounter: Payer: Self-pay | Admitting: Family Medicine

## 2018-11-25 DIAGNOSIS — N898 Other specified noninflammatory disorders of vagina: Secondary | ICD-10-CM | POA: Diagnosis not present

## 2018-11-25 DIAGNOSIS — R300 Dysuria: Secondary | ICD-10-CM

## 2018-11-25 DIAGNOSIS — Z992 Dependence on renal dialysis: Secondary | ICD-10-CM | POA: Diagnosis not present

## 2018-11-25 DIAGNOSIS — N186 End stage renal disease: Secondary | ICD-10-CM | POA: Diagnosis not present

## 2018-11-25 LAB — URINALYSIS, COMPLETE
Bilirubin, UA: NEGATIVE
Ketones, UA: NEGATIVE
Nitrite, UA: NEGATIVE
Specific Gravity, UA: 1.015 (ref 1.005–1.030)
Urobilinogen, Ur: 0.2 mg/dL (ref 0.2–1.0)
pH, UA: 5.5 (ref 5.0–7.5)

## 2018-11-25 LAB — WET PREP FOR TRICH, YEAST, CLUE
Clue Cell Exam: POSITIVE — AB
Trichomonas Exam: NEGATIVE
Yeast Exam: NEGATIVE

## 2018-11-25 LAB — MICROSCOPIC EXAMINATION: WBC, UA: >30 /hpf — AB (ref 0–5)

## 2018-11-25 NOTE — Progress Notes (Signed)
Virtual Visit via telephone Note Due to COVID-19 pandemic this visit was conducted virtually. This visit type was conducted due to national recommendations for restrictions regarding the COVID-19 Pandemic (e.g. social distancing, sheltering in place) in an effort to limit this patient's exposure and mitigate transmission in our community. All issues noted in this document were discussed and addressed.  A physical exam was not performed with this format.   I connected with [name] and her daughter on 11/25/18 at [place] by telephone and verified that I am speaking with the correct person using two identifiers. [name] is currently located at home and family is currently with them during visit. The provider, Monia Pouch, FNP is located in their office at time of visit.  I discussed the limitations, risks, security and privacy concerns of performing an evaluation and management service by telephone and the availability of in person appointments. I also discussed with the patient that there may be a patient responsible charge related to this service. The patient expressed understanding and agreed to proceed.  Subjective:  Patient ID: [name], female    DOB: 08-31-1955, 63 y.o.   MRN: [id number]  Chief Complaint:  Vaginal Itching and Dysuria   HPI: [name] is a 63 y.o. female presenting on 11/25/2018 for Vaginal Itching and Dysuria   Pt and daughter report continued vaginal irritation and new dysuria. Pt was seen on 11/20/2018 for vaginal irritation and empirically placed on Diflucan. Daughter states symptoms persist and she is now having symptoms of an UTI. States she voids minimally due to end stage renal failure and dialysis but does complain of dysuria when she voids. States she continues to complain of vaginal irritation. No significant discharge noted. Not sexually active. No fever, confusion, or weakness. Does have slight malaise.   Vaginal Itching The patient's  primary symptoms include genital itching. The patient's pertinent negatives include no genital lesions, genital odor, pelvic pain, vaginal bleeding or vaginal discharge. This is a recurrent problem. The current episode started 1 to 4 weeks ago. The problem occurs daily. The problem has been unchanged. The pain is mild. The problem affects both sides. She is not pregnant. Associated symptoms include discolored urine, dysuria and urgency. Pertinent negatives include no abdominal pain, anorexia, back pain, chills, constipation, diarrhea, fever, flank pain, frequency, headaches, hematuria, joint pain, joint swelling, nausea, painful intercourse, rash, sore throat or vomiting. She has tried antifungals for the symptoms. The treatment provided no relief. She is not sexually active. She is postmenopausal.  Dysuria  This is a new problem. The current episode started in the past 7 days. The problem occurs every urination. The problem has been unchanged. The quality of the pain is described as burning and aching. The pain is at a severity of 2/10. The pain is mild. There has been no fever. She is not sexually active. There is no history of pyelonephritis. Associated symptoms include urgency. Pertinent negatives include no chills, discharge, flank pain, frequency, hematuria, hesitancy, nausea, possible pregnancy, sweats or vomiting. She has tried nothing for the symptoms.     Relevant past medical, surgical, family, and social history reviewed and updated as indicated.  Allergies and medications reviewed and updated.   Past Medical History:  Diagnosis Date  . Anemia    of chronic disease  . Blood transfusion without reported diagnosis   . Cardiovascular disease    nonobstructive  . Carotid artery stenosis 2008  . CHF (congestive heart failure) ([place])   .  Coronary artery disease   . Diabetes mellitus   . Dyslipidemia   . Edema of lower extremity    with hypo-albuminemia and profound protenuria  . Heart  murmur   . Hypertension   . Mitral regurgitation   . Nephrotic syndrome   . Patent foramen ovale   . Pulmonary hypertension, moderate to severe ([place])   . Pulmonary nodule   . Tricuspid regurgitation   . Volume depletion, renal, due to output loss (renal deficit)     Past Surgical History:  Procedure Laterality Date  . A/V FISTULAGRAM N/A 04/05/2017   Procedure: A/V FISTULAGRAM - Left Arm;  Surgeon: Angelia Mould, MD;  Location: [facility];  Service: Cardiovascular;  Laterality: N/A;  . A/V FISTULAGRAM Left 09/18/2017   Procedure: A/V FISTULAGRAM;  Surgeon: Serafina Mitchell, MD;  Location: [facility];  Service: Cardiovascular;  Laterality: Left;  . A/V SHUNT INTERVENTION Left 04/05/2017   Procedure: A/V SHUNT INTERVENTION;  Surgeon: Angelia Mould, MD;  Location: [facility];  Service: Cardiovascular;  Laterality: Left;  . CARDIAC CATHETERIZATION  2008  . IR REMOVAL TUN CV CATH W/O FL  11/02/2016  . 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 PERIPHERAL VASCULAR BALLOON ANGIOPLASTY Left 09/18/2017   Procedure: PERIPHERAL VASCULAR BALLOON ANGIOPLASTY;  Surgeon: Serafina Mitchell, MD;  Location: [facility];  Service: Cardiovascular;  Laterality: Left;  Arm Fistula    Social History   Socioeconomic History  . Marital status: Married    Spouse name: Not on file  . Number of children: Not on file  . Years of education: Not on file  . Highest education level: Not on file  Occupational History  . Not on file  Social Needs  . Financial resource strain: Not on file  . Food insecurity    Worry: Not on file    Inability: Not on file  . Transportation needs    Medical: Not on file    Non-medical: Not on file  Tobacco Use  . Smoking status: Never Smoker  . Smokeless tobacco: Never Used  Substance and Sexual Activity  . Alcohol use: No  . Drug use: No  . Sexual activity: Never  Lifestyle  . Physical activity    Days per week: Not on file     Minutes per session: Not on file  . Stress: Not on file  Relationships  . Social Herbalist on phone: Not on file    Gets together: Not on file    Attends religious service: Not on file    Active member of club or organization: Not on file    Attends meetings of clubs or organizations: Not on file    Relationship status: Not on file  . Intimate partner violence    Fear of current or ex partner: Not on file    Emotionally abused: Not on file    Physically abused: Not on file    Forced sexual activity: Not on file  Other Topics Concern  . Not on file  Social History Narrative  . Not on file    Outpatient Encounter Medications as of 11/25/2018  Medication Sig  . acetaminophen (TYLENOL) 500 MG tablet Take 500 mg by mouth every 6 (six) hours as needed for moderate pain or headache.   . albuterol (PROVENTIL HFA;VENTOLIN HFA) 108 (90 Base) MCG/ACT inhaler Inhale 1-2 puffs into the lungs every 6 (six) hours as needed for wheezing or shortness of  breath.  Marland Kitchen amLODipine (NORVASC) 5 MG tablet Take 1 tablet by mouth nightly  . aspirin (ASPIR-LOW) 81 MG EC tablet Take 81 mg by mouth daily.   . B Complex-C-Folic Acid (RENA-VITE RX) 1 MG TABS Take 1 tablet by mouth daily.  . fluticasone (FLONASE) 50 MCG/ACT nasal spray Place 2 sprays into both nostrils daily.  . insulin degludec (TRESIBA FLEXTOUCH) 100 UNIT/ML SOPN FlexTouch Pen Inject 0.1 mLs (10 Units total) into the skin daily at 10 pm.  . isosorbide mononitrate (IMDUR) 60 MG 24 hr tablet Take 1 tablet by mouth once daily  . lidocaine-prilocaine (EMLA) cream Apply 1 application topically as needed (for dialysis).   . metoprolol tartrate (LOPRESSOR) 25 MG tablet Take 1/2 (one-half) tablet by mouth twice daily  . ondansetron (ZOFRAN ODT) 4 MG disintegrating tablet 4mg  ODT q4 hours prn nausea/vomit (Patient taking differently: Take 4 mg by mouth 2 (two) times daily. 4mg  ODT q4 hours prn nausea/vomit)  . predniSONE (DELTASONE) 5 MG tablet  TAKE 1 TABLET BY MOUTH ONCE DAILY WITH BREAKFAST  . PROGRAF 0.5 MG capsule Take 0.5-1 mg by mouth See admin instructions. Take 1 mg by mouth in the morning and take 0.5 mg by mouth at bedtime  . rosuvastatin (CRESTOR) 20 MG tablet Take 1 tablet by mouth once daily  . sevelamer carbonate (RENVELA) 800 MG tablet Take 800 mg by mouth See admin instructions. 4 tablets with meals and 2 tablets with snacks.  . triamcinolone ointment (KENALOG) 0.5 % Apply 1 application topically 2 (two) times daily.  . TRULICITY 1.5 0000000 SOPN INJECT 1 DOSE (1.5 MG) SUBCUTANEOUSLY ONCE A WEEK   No facility-administered encounter medications on file as of 11/25/2018.     Allergies  Allergen Reactions  . Other Other (See Comments)    Tape Bruises and tears skin, Paper tape tolerated  . Tape Other (See Comments)    Bruises and tears skin Paper tape tolerated Bruises and tears skin Paper tape tolerated Bruises and tears skin Paper tape tolerated    Review of Systems  Constitutional: Negative for activity change, appetite change, chills, diaphoresis, fatigue, fever and unexpected weight change.       Malaise  HENT: Negative.  Negative for sore throat.   Eyes: Negative.   Respiratory: Negative for cough, chest tightness and shortness of breath.   Cardiovascular: Negative for chest pain, palpitations and leg swelling.  Gastrointestinal: Negative for abdominal pain, anorexia, blood in stool, constipation, diarrhea, nausea and vomiting.  Endocrine: Negative.   Genitourinary: Positive for decreased urine volume (CKD, dialysis), dysuria, urgency and vaginal pain. Negative for flank pain, frequency, hematuria, hesitancy, pelvic pain, vaginal bleeding and vaginal discharge.  Musculoskeletal: Negative for arthralgias, back pain, joint pain and myalgias.  Skin: Negative.  Negative for color change and rash.  Allergic/Immunologic: Negative.   Neurological: Negative for dizziness, syncope, weakness, light-headedness  and headaches.  Hematological: Negative.   Psychiatric/Behavioral: Negative for agitation, confusion, hallucinations, sleep disturbance and suicidal ideas.  All other systems reviewed and are negative.        Observations/Objective: No vital signs or physical exam, this was a telephone or virtual health encounter.  Pt alert and oriented, answers all questions appropriately, and able to speak in full sentences.    Assessment and Plan: [name] was seen today for vaginal itching and dysuria.  Diagnoses and all orders for this visit:  Vaginal irritation Ongoing vaginal irritation despite Diflucan therapy. Pt will come to office to provide wet prep today. Treatment depending on  results.  -     WET PREP FOR TRICH, YEAST, CLUE  Dysuria New dysuria with slight malaise. Pt will come to office today to provide urine for evaluation. Treatment depending on results.  -     Urinalysis, Complete -     Urine Culture     Follow Up Instructions: Return if symptoms worsen or fail to improve.    I discussed the assessment and treatment plan with the patient. The patient was provided an opportunity to ask questions and all were answered. The patient agreed with the plan and demonstrated an understanding of the instructions.   The patient was advised to call back or seek an in-person evaluation if the symptoms worsen or if the condition fails to improve as anticipated.  The above assessment and management plan was discussed with the patient. The patient verbalized understanding of and has agreed to the management plan. Patient is aware to call the clinic if they develop any new symptoms or if symptoms persist or worsen. Patient is aware when to return to the clinic for a follow-up visit. Patient educated on when it is appropriate to go to the emergency department.    I provided 15 minutes of non-face-to-face time during this encounter. The call started at [place]. The call ended at 0955. The other  time was used for coordination of care.    Monia Pouch, FNP-C [facility] [address] [place], [place] [address] [phone] 11/25/18

## 2018-11-26 ENCOUNTER — Emergency Department (HOSPITAL_COMMUNITY)
Admission: EM | Admit: 2018-11-26 | Discharge: 2018-11-26 | Disposition: A | Discharge status: Home / Self Care | Payer: BC Managed Care – PPO | Attending: Emergency Medicine | Admitting: Emergency Medicine

## 2018-11-26 ENCOUNTER — Encounter (HOSPITAL_COMMUNITY): Payer: Self-pay | Admitting: Emergency Medicine

## 2018-11-26 ENCOUNTER — Emergency Department (HOSPITAL_COMMUNITY): Payer: BC Managed Care – PPO

## 2018-11-26 ENCOUNTER — Other Ambulatory Visit: Payer: Self-pay

## 2018-11-26 DIAGNOSIS — R739 Hyperglycemia, unspecified: Secondary | ICD-10-CM

## 2018-11-26 DIAGNOSIS — B9689 Other specified bacterial agents as the cause of diseases classified elsewhere: Secondary | ICD-10-CM

## 2018-11-26 DIAGNOSIS — Z79899 Other long term (current) drug therapy: Secondary | ICD-10-CM | POA: Diagnosis not present

## 2018-11-26 DIAGNOSIS — R42 Dizziness and giddiness: Secondary | ICD-10-CM | POA: Diagnosis not present

## 2018-11-26 DIAGNOSIS — I2510 Atherosclerotic heart disease of native coronary artery without angina pectoris: Secondary | ICD-10-CM | POA: Insufficient documentation

## 2018-11-26 DIAGNOSIS — N3001 Acute cystitis with hematuria: Secondary | ICD-10-CM | POA: Diagnosis not present

## 2018-11-26 DIAGNOSIS — N186 End stage renal disease: Secondary | ICD-10-CM | POA: Insufficient documentation

## 2018-11-26 DIAGNOSIS — I132 Hypertensive heart and chronic kidney disease with heart failure and with stage 5 chronic kidney disease, or end stage renal disease: Secondary | ICD-10-CM | POA: Diagnosis not present

## 2018-11-26 DIAGNOSIS — H8110 Benign paroxysmal vertigo, unspecified ear: Secondary | ICD-10-CM | POA: Diagnosis not present

## 2018-11-26 DIAGNOSIS — N760 Acute vaginitis: Secondary | ICD-10-CM | POA: Diagnosis not present

## 2018-11-26 DIAGNOSIS — Z794 Long term (current) use of insulin: Secondary | ICD-10-CM | POA: Insufficient documentation

## 2018-11-26 DIAGNOSIS — Z7982 Long term (current) use of aspirin: Secondary | ICD-10-CM | POA: Insufficient documentation

## 2018-11-26 DIAGNOSIS — E1165 Type 2 diabetes mellitus with hyperglycemia: Secondary | ICD-10-CM | POA: Insufficient documentation

## 2018-11-26 DIAGNOSIS — Z992 Dependence on renal dialysis: Secondary | ICD-10-CM | POA: Insufficient documentation

## 2018-11-26 DIAGNOSIS — I5032 Chronic diastolic (congestive) heart failure: Secondary | ICD-10-CM | POA: Diagnosis not present

## 2018-11-26 LAB — COMPREHENSIVE METABOLIC PANEL
ALT: 35 U/L (ref 0–44)
AST: 39 U/L (ref 15–41)
Albumin: 3.4 g/dL — ABNORMAL LOW (ref 3.5–5.0)
Alkaline Phosphatase: 90 U/L (ref 38–126)
Anion gap: 12 (ref 5–15)
BUN: 33 mg/dL — ABNORMAL HIGH (ref 8–23)
CO2: 28 mmol/L (ref 22–32)
Calcium: 8.5 mg/dL — ABNORMAL LOW (ref 8.9–10.3)
Chloride: 96 mmol/L — ABNORMAL LOW (ref 98–111)
Creatinine, Ser: 6.47 mg/dL — ABNORMAL HIGH (ref 0.44–1.00)
GFR calc Af Amer: 7 mL/min — ABNORMAL LOW (ref >60)
GFR calc non Af Amer: 6 mL/min — ABNORMAL LOW (ref >60)
Glucose, Bld: 360 mg/dL — ABNORMAL HIGH (ref 70–99)
Potassium: 4.3 mmol/L (ref 3.5–5.1)
Sodium: 136 mmol/L (ref 135–145)
Total Bilirubin: 0.7 mg/dL (ref 0.3–1.2)
Total Protein: 7.3 g/dL (ref 6.5–8.1)

## 2018-11-26 LAB — URINALYSIS, ROUTINE W REFLEX MICROSCOPIC
Bilirubin Urine: NEGATIVE
Glucose, UA: 150 mg/dL — AB
Ketones, ur: NEGATIVE mg/dL
Nitrite: NEGATIVE
Protein, ur: 30 mg/dL — AB
Specific Gravity, Urine: 1.016 (ref 1.005–1.030)
WBC, UA: >50 WBC/hpf — ABNORMAL HIGH (ref 0–5)
pH: 5 (ref 5.0–8.0)

## 2018-11-26 LAB — CBC WITH DIFFERENTIAL/PLATELET
Abs Immature Granulocytes: 0.03 10*3/uL (ref 0.00–0.07)
Basophils Absolute: 0.1 10*3/uL (ref 0.0–0.1)
Basophils Relative: 1 %
Eosinophils Absolute: 0.3 10*3/uL (ref 0.0–0.5)
Eosinophils Relative: 3 %
HCT: 33.1 % — ABNORMAL LOW (ref 36.0–46.0)
Hemoglobin: 10.2 g/dL — ABNORMAL LOW (ref 12.0–15.0)
Immature Granulocytes: 0 %
Lymphocytes Relative: 6 %
Lymphs Abs: 0.6 10*3/uL — ABNORMAL LOW (ref 0.7–4.0)
MCH: 30.9 pg (ref 26.0–34.0)
MCHC: 30.8 g/dL (ref 30.0–36.0)
MCV: 100.3 fL — ABNORMAL HIGH (ref 80.0–100.0)
Monocytes Absolute: 0.7 10*3/uL (ref 0.1–1.0)
Monocytes Relative: 7 %
Neutro Abs: 8.4 10*3/uL — ABNORMAL HIGH (ref 1.7–7.7)
Neutrophils Relative %: 83 %
Platelets: 184 10*3/uL (ref 150–400)
RBC: 3.3 MIL/uL — ABNORMAL LOW (ref 3.87–5.11)
RDW: 12.8 % (ref 11.5–15.5)
WBC: 10 10*3/uL (ref 4.0–10.5)
nRBC: 0 % (ref 0.0–0.2)

## 2018-11-26 LAB — SEDIMENTATION RATE: Sed Rate: 35 mm/hr — ABNORMAL HIGH (ref 0–22)

## 2018-11-26 MED ORDER — ACETAMINOPHEN 500 MG PO TABS
1000.0000 mg | ORAL_TABLET | Freq: Once | ORAL | Status: AC
  Administered 2018-11-26: 1000 mg via ORAL
  Filled 2018-11-26: qty 2

## 2018-11-26 MED ORDER — CEPHALEXIN 500 MG PO CAPS: 500.0000 mg | ORAL_CAPSULE | Freq: Two times a day (BID) | ORAL | 0 refills | Status: DC

## 2018-11-26 MED ORDER — METRONIDAZOLE 500 MG PO TABS: 500.0000 mg | ORAL_TABLET | Freq: Two times a day (BID) | ORAL | 0 refills | Status: DC

## 2018-11-26 MED ORDER — INSULIN ASPART 100 UNIT/ML ~~LOC~~ SOLN
7.0000 [IU] | Freq: Once | SUBCUTANEOUS | Status: AC
  Administered 2018-11-26: 7 [IU] via SUBCUTANEOUS
  Filled 2018-11-26: qty 1

## 2018-11-26 MED ORDER — CEPHALEXIN 500 MG PO CAPS
500.0000 mg | ORAL_CAPSULE | Freq: Once | ORAL | Status: AC
  Administered 2018-11-26: 500 mg via ORAL
  Filled 2018-11-26: qty 1

## 2018-11-26 MED ORDER — PROCHLORPERAZINE EDISYLATE 10 MG/2ML IJ SOLN
10.0000 mg | Freq: Once | INTRAMUSCULAR | Status: AC
  Administered 2018-11-26: 10 mg via INTRAMUSCULAR
  Filled 2018-11-26: qty 2

## 2018-11-26 MED ORDER — MECLIZINE HCL 12.5 MG PO TABS
25.0000 mg | ORAL_TABLET | Freq: Once | ORAL | Status: AC
  Administered 2018-11-26: 25 mg via ORAL
  Filled 2018-11-26: qty 2

## 2018-11-26 MED ORDER — METRONIDAZOLE 500 MG PO TABS
500.0000 mg | ORAL_TABLET | Freq: Once | ORAL | Status: AC
  Administered 2018-11-26: 500 mg via ORAL
  Filled 2018-11-26: qty 1

## 2018-11-26 MED ORDER — DEXAMETHASONE SODIUM PHOSPHATE 10 MG/ML IJ SOLN
10.0000 mg | Freq: Once | INTRAMUSCULAR | Status: AC
  Administered 2018-11-26: 10 mg via INTRAMUSCULAR
  Filled 2018-11-26: qty 1

## 2018-11-26 MED ORDER — DIAZEPAM 2 MG PO TABS
2.0000 mg | ORAL_TABLET | Freq: Once | ORAL | Status: AC
  Administered 2018-11-26: 2 mg via ORAL
  Filled 2018-11-26: qty 1

## 2018-11-26 MED ORDER — MECLIZINE HCL 25 MG PO TABS: 25.0000 mg | ORAL_TABLET | Freq: Two times a day (BID) | ORAL | 0 refills | Status: DC | PRN

## 2018-11-26 NOTE — ED Triage Notes (Signed)
Pt c/o dizziness and general malaise x 1 week. Pt also endorses nausea. Pt went to dialysis yesterday. Denies fever, cough, sore throat, or SOB.

## 2018-11-26 NOTE — Discharge Instructions (Signed)
Take the medicines prescribed, the flagyl and keflex until gone - these will help with your urinary and your vaginal infection.  Take the meclizine (antivert) only if needed for return of vertigo.  Your blood glucose is quite elevated tonight - make sure you are keeping a close watch on your glucose levels.  Take your antibiotics after your dialysis as discussed.

## 2018-11-26 NOTE — ED Provider Notes (Signed)
[facility] EMERGENCY DEPARTMENT Provider Note   CSN: [id number] Arrival date & time: 11/26/18  1344     History   Chief Complaint Chief Complaint  Patient presents with  . Dizziness    HPI [name] is a 63 y.o. female with a history of CAD, CHF, insulin-dependent diabetes, hypertension including pulmonary hypertension and chronic renal failure on dialysis, MWF, presenting with generalized fatigue for the past 4 days in addition to nausea, dysuria and vaginal itching.  She was seen via tele-visit with her PCP and was prescribed Diflucan tablet which has not relieved her symptoms.  She supplied a urinalysis to her physicians lab along with a self completed vaginal swab yesterday but has had no results yet.  She denies fevers or chills, no vomiting, also no diarrhea, abdominal pain chest pain or shortness of breath.  Additionally she reports a mild chronic headache the past several days mostly frontal but also involving her bilateral temple areas, then woke this morning with worsened headache and dizziness, described as room spinning with positional changes.  She denies visual changes, no blurred vision, also no fevers or chills.  Additionally denies neck pain or stiffness.  She took tylenol 500 mg this morning with mild headache improvement.     The history is provided by the patient.  Dizziness Associated symptoms: headaches and nausea   Associated symptoms: no chest pain, no shortness of breath, no tinnitus, no vomiting and no weakness     Past Medical History:  Diagnosis Date  . Anemia    of chronic disease  . Blood transfusion without reported diagnosis   . Cardiovascular disease    nonobstructive  . Carotid artery stenosis 2008  . CHF (congestive heart failure) ([place])   . Coronary artery disease   . Diabetes mellitus   . Dyslipidemia   . Edema of lower extremity    with hypo-albuminemia and profound protenuria  . Heart murmur   . Hypertension   . Mitral  regurgitation   . Nephrotic syndrome   . Patent foramen ovale   . Pulmonary hypertension, moderate to severe ([place])   . Pulmonary nodule   . Tricuspid regurgitation   . Volume depletion, renal, due to output loss (renal deficit)     Patient Active Problem List   Diagnosis Date Noted  . Hydronephrosis   . Abnormal LFTs (liver function tests) 03/11/2018  . Type 2 diabetes mellitus ([place]) 03/11/2018  . Valvular heart disease 03/11/2018  . Chronic kidney disease on chronic dialysis ([place]) 11/13/2017  . Leukocytosis 09/21/2017  . Generalized weakness 09/21/2017  . Hyperlipidemia associated with type 2 diabetes mellitus ([place]) 02/22/2017  . ESRD (end stage renal disease) ([place]) Aug 24, 2016  . 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 Encounter for aftercare following kidney transplant 05/22/2016  . Chronic diastolic heart failure ([place]) 06/08/2015  . Aortic valve sclerosis 06/08/2015  . Anemia of chronic disease 07/03/2013  . Hypertension associated with diabetes ([place]) 12/18/2010    Past Surgical History:  Procedure Laterality Date  . A/V FISTULAGRAM N/A 04/05/2017   Procedure: A/V FISTULAGRAM - Left Arm;  Surgeon: Angelia Mould, MD;  Location: [place] [facility];  Service: Cardiovascular;  Laterality: N/A;  . A/V FISTULAGRAM Left 09/18/2017   Procedure: A/V FISTULAGRAM;  Surgeon: Serafina Mitchell, MD;  Location: [place] [facility];  Service: Cardiovascular;  Laterality: Left;  . A/V SHUNT INTERVENTION Left 04/05/2017   Procedure: A/V SHUNT INTERVENTION;  Surgeon: Angelia Mould, MD;  Location: [facility]  INVASIVE CV LAB;  Service: Cardiovascular;  Laterality: Left;  . CARDIAC CATHETERIZATION  2008  . IR REMOVAL TUN CV CATH W/O FL  11/02/2016  . 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 PERIPHERAL VASCULAR BALLOON ANGIOPLASTY Left 09/18/2017   Procedure: PERIPHERAL VASCULAR BALLOON ANGIOPLASTY;  Surgeon: Serafina Mitchell, MD;  Location: [place] [facility];  Service:  Cardiovascular;  Laterality: Left;  Arm Fistula     OB History   No obstetric history on file.      Home Medications    Prior to Admission medications   Medication Sig Start Date End Date Taking? Authorizing Provider  acetaminophen (TYLENOL) 500 MG tablet Take 500 mg by mouth every 6 (six) hours as needed for moderate pain or headache.  04/06/16   [provider]  albuterol (PROVENTIL HFA;VENTOLIN HFA) 108 (90 Base) MCG/ACT inhaler Inhale 1-2 puffs into the lungs every 6 (six) hours as needed for wheezing or shortness of breath. 05/05/18   Triplett, Tammy, PA-C  amLODipine (NORVASC) 5 MG tablet Take 1 tablet by mouth nightly 11/25/18   Evelina Dun A, FNP  aspirin (ASPIR-LOW) 81 MG EC tablet Take 81 mg by mouth daily.     [provider]  B Complex-C-Folic Acid (RENA-VITE RX) 1 MG TABS Take 1 tablet by mouth daily. 09/24/18   [provider]  cephALEXin (KEFLEX) 500 MG capsule Take 1 capsule (500 mg total) by mouth 2 (two) times daily. 11/26/18   Evalee Jefferson, PA-C  fluconazole (DIFLUCAN) 150 MG tablet TAKE ONE TABLET BY MOUTH AS A ONE TIME DOSE REPEAT DOSE IN 3 DAYS IF NEEDED 11/20/18   [provider]  fluticasone (FLONASE) 50 MCG/ACT nasal spray Place 2 sprays into both nostrils daily. 05/03/18   Evelina Dun A, FNP  insulin degludec (TRESIBA FLEXTOUCH) 100 UNIT/ML SOPN FlexTouch Pen Inject 0.1 mLs (10 Units total) into the skin daily at 10 pm. 11/05/18   Sharion Balloon, FNP  isosorbide mononitrate (IMDUR) 60 MG 24 hr tablet Take 1 tablet by mouth once daily 11/18/18   Evelina Dun A, FNP  lidocaine-prilocaine (EMLA) cream Apply 1 application topically as needed (for dialysis).  09/08/16   [provider]  meclizine (ANTIVERT) 25 MG tablet Take 1 tablet (25 mg total) by mouth 2 (two) times daily as needed for dizziness. 11/26/18   Evalee Jefferson, PA-C  metoprolol tartrate (LOPRESSOR) 25 MG tablet Take 1/2 (one-half) tablet by mouth twice daily 10/02/18    Evelina Dun A, FNP  metroNIDAZOLE (FLAGYL) 500 MG tablet Take 1 tablet (500 mg total) by mouth 2 (two) times daily. 11/26/18   Evalee Jefferson, PA-C  ondansetron (ZOFRAN ODT) 4 MG disintegrating tablet 4mg  ODT q4 hours prn nausea/vomit Patient taking differently: Take 4 mg by mouth 2 (two) times daily. 4mg  ODT q4 hours prn nausea/vomit 11/26/17   Milton Ferguson, MD  predniSONE (DELTASONE) 5 MG tablet TAKE 1 TABLET BY MOUTH ONCE DAILY WITH BREAKFAST 04/22/18   Hawks, Christy A, FNP  PROGRAF 0.5 MG capsule Take 0.5-1 mg by mouth See admin instructions. Take 1 mg by mouth in the morning and take 0.5 mg by mouth at bedtime 10/24/16   [provider]  rosuvastatin (CRESTOR) 20 MG tablet Take 1 tablet by mouth once daily 10/28/18   Evelina Dun A, FNP  sevelamer carbonate (RENVELA) 800 MG tablet Take 800 mg by mouth See admin instructions. 4 tablets with meals and 2 tablets with snacks. 10/02/16   [provider]  triamcinolone ointment (KENALOG)  0.5 % Apply 1 application topically 2 (two) times daily. 10/17/18   Sharion Balloon, FNP  TRULICITY 1.5 0000000 SOPN INJECT 1 DOSE (1.5 MG) SUBCUTANEOUSLY ONCE A WEEK 10/02/18   Sharion Balloon, FNP    Family History Family History  Problem Relation Age of Onset  . Kidney disease Mother   . Diabetes Mother   . Diabetes Father   . Heart disease Father     Social History Social History   Tobacco Use  . Smoking status: Never Smoker  . Smokeless tobacco: Never Used  Substance Use Topics  . Alcohol use: No  . Drug use: No     Allergies   Other and Tape   Review of Systems Review of Systems  Constitutional: Positive for fatigue. Negative for fever.  HENT: Negative for congestion, ear pain, sinus pain, sore throat and tinnitus.   Eyes: Negative.  Negative for photophobia, pain and visual disturbance.  Respiratory: Negative for chest tightness and shortness of breath.   Cardiovascular: Negative for chest pain.  Gastrointestinal:  Positive for nausea. Negative for abdominal pain and vomiting.  Genitourinary: Positive for dysuria and vaginal discharge.  Musculoskeletal: Negative for arthralgias, joint swelling, neck pain and neck stiffness.  Skin: Negative.  Negative for rash and wound.  Neurological: Positive for dizziness and headaches. Negative for tremors, syncope, facial asymmetry, speech difficulty, weakness, light-headedness and numbness.  Psychiatric/Behavioral: Negative.      Physical Exam Updated Vital Signs BP (!) 137/44   Pulse (!) 55   Temp 98.2 F (36.8 C) (Oral)   Resp 16   Ht 5\' 9"  (1.753 m)   Wt 65.8 kg   SpO2 97%   BMI 21.41 kg/m   Physical Exam Vitals signs and nursing note reviewed.  Constitutional:      Appearance: Normal appearance. She is well-developed.  HENT:     Head: Normocephalic and atraumatic.     Jaw: No trismus.     Comments: Mild tenderness bilateral temples and across forehead. No sinus ttp.    Right Ear: Hearing and tympanic membrane normal.     Left Ear: Hearing and tympanic membrane normal.     Mouth/Throat:     Mouth: Mucous membranes are moist.  Eyes:     General: No visual field deficit.    Extraocular Movements: Extraocular movements intact.     Right eye: Nystagmus present.     Left eye: Nystagmus present.     Conjunctiva/sclera: Conjunctivae normal.     Pupils: Pupils are equal, round, and reactive to light.     Comments: 2 beat lateral nystagmus.  Neck:     Musculoskeletal: Normal range of motion.  Cardiovascular:     Rate and Rhythm: Normal rate and regular rhythm.     Heart sounds: Normal heart sounds.  Pulmonary:     Effort: Pulmonary effort is normal.     Breath sounds: Normal breath sounds. No wheezing.  Abdominal:     General: Bowel sounds are normal.     Palpations: Abdomen is soft.     Tenderness: There is no abdominal tenderness.  Musculoskeletal: Normal range of motion.  Skin:    General: Skin is warm and dry.  Neurological:      General: No focal deficit present.     Mental Status: She is alert and oriented to person, place, and time.     Cranial Nerves: Cranial nerves are intact. No cranial nerve deficit or facial asymmetry.     Sensory: Sensation is  intact.     Motor: Motor function is intact.     Coordination: Coordination is intact. Coordination normal. Finger-Nose-Finger Test and Heel to [facility] Test normal. Rapid alternating movements normal.     Comments: Equal grip strength.      ED Treatments / Results  Labs (all labs ordered are listed, but only abnormal results are displayed) Labs Reviewed  CBC WITH DIFFERENTIAL/PLATELET - Abnormal; Notable for the following components:      Result Value   RBC 3.30 (*)    Hemoglobin 10.2 (*)    HCT 33.1 (*)    MCV 100.3 (*)    Neutro Abs 8.4 (*)    Lymphs Abs 0.6 (*)    All other components within normal limits  COMPREHENSIVE METABOLIC PANEL - Abnormal; Notable for the following components:   Chloride 96 (*)    Glucose, Bld 360 (*)    BUN 33 (*)    Creatinine, Ser 6.47 (*)    Calcium 8.5 (*)    Albumin 3.4 (*)    GFR calc non Af Amer 6 (*)    GFR calc Af Amer 7 (*)    All other components within normal limits  URINALYSIS, ROUTINE W REFLEX MICROSCOPIC - Abnormal; Notable for the following components:   APPearance CLOUDY (*)    Glucose, UA 150 (*)    Hgb urine dipstick SMALL (*)    Protein, ur 30 (*)    Leukocytes,Ua LARGE (*)    WBC, UA >50 (*)    Bacteria, UA FEW (*)    All other components within normal limits  SEDIMENTATION RATE - Abnormal; Notable for the following components:   Sed Rate 35 (*)    All other components within normal limits  URINE CULTURE    EKG None  Radiology Ct Head Wo Contrast  Result Date: 11/26/2018 CLINICAL DATA:  Vertigo with 1 week history of dizziness and nausea. Renal failure. EXAM: CT HEAD WITHOUT CONTRAST TECHNIQUE: Contiguous axial images were obtained from the base of the skull through the vertex without  intravenous contrast. COMPARISON:  November 26, 2017 FINDINGS: Brain: There is slight age related volume loss. There is no intracranial mass, hemorrhage, extra-axial fluid collection, or midline shift. Brain parenchyma appears unremarkable. No evident acute infarct. Vascular: There is no hyperdense vessel. There is calcification in each distal vertebral artery and carotid siphon region. Skull: Bony calvarium appears intact. Sinuses/Orbits: Visualized paranasal sinuses are clear. Visualized orbits appear symmetric bilaterally. Other: There is chronic opacification of an inferior mastoid air cell on the left. Mastoids elsewhere clear. IMPRESSION: Age related volume loss. Brain parenchyma appears unremarkable. No mass or hemorrhage. Extensive arterial vascular calcification noted. Opacification in inferior mastoid air cell on the left, chronic. Electronically Signed   By: Lowella Grip III M.D.   On: 11/26/2018 20:13    Procedures Procedures (including critical care time)  Medications Ordered in ED Medications  cephALEXin (KEFLEX) capsule 500 mg (has no administration in time range)  metroNIDAZOLE (FLAGYL) tablet 500 mg (has no administration in time range)  insulin aspart (novoLOG) injection 7 Units (has no administration in time range)  acetaminophen (TYLENOL) tablet 1,000 mg (1,000 mg Oral Given 11/26/18 1940)  meclizine (ANTIVERT) tablet 25 mg (25 mg Oral Given 11/26/18 1940)  diazepam (VALIUM) tablet 2 mg (2 mg Oral Given 11/26/18 1940)  dexamethasone (DECADRON) injection 10 mg (10 mg Intramuscular Given 11/26/18 2057)  prochlorperazine (COMPAZINE) injection 10 mg (10 mg Intramuscular Given 11/26/18 2058)     Initial Impression /  Assessment and Plan / ED Course  I have reviewed the triage vital signs and the nursing notes.  Pertinent labs & imaging results that were available during my care of the patient were reviewed by me and considered in my medical decision making (see chart for  details).        Labs and imaging reviewed and discussed with pt.  CT imaging with no acute findings.  She was given antivert and low dose valium here with complete resolution of vertigo sx.  Still with moderate headache after increased dose of tylenol given.  Added IM dosing of compazine and decadron with resolved headache.  She does have a sig elevated glucose level but without anion gap, no ketones in urine.  UA is positive for uti, also labs including self vaginal swab she dropped at lab ytd positive for clue cells, will tx for bv.  Flagyl and keflex started here.  Sed rate positive but low, bilateral temple pain, not unilateral, less likely temporal arteritis.    Discussed results with Dr. Roderic Palau prior to dc home.  Final Clinical Impressions(s) / ED Diagnoses   Final diagnoses:  Benign paroxysmal positional vertigo, unspecified laterality  Acute cystitis with hematuria  Bacterial vaginosis  Hyperglycemia    ED Discharge Orders         Ordered    cephALEXin (KEFLEX) 500 MG capsule  2 times daily     11/26/18 2252    metroNIDAZOLE (FLAGYL) 500 MG tablet  2 times daily     11/26/18 2252    meclizine (ANTIVERT) 25 MG tablet  2 times daily PRN     11/26/18 2252           Evalee Jefferson, PA-C 11/26/18 2300    Milton Ferguson, MD 11/28/18 1100

## 2018-11-27 DIAGNOSIS — Z992 Dependence on renal dialysis: Secondary | ICD-10-CM | POA: Diagnosis not present

## 2018-11-27 DIAGNOSIS — N186 End stage renal disease: Secondary | ICD-10-CM | POA: Diagnosis not present

## 2018-11-27 LAB — URINE CULTURE

## 2018-11-28 LAB — URINE CULTURE
Culture: <10000 — AB
Special Requests: NORMAL

## 2018-11-29 DIAGNOSIS — Z992 Dependence on renal dialysis: Secondary | ICD-10-CM | POA: Diagnosis not present

## 2018-11-29 DIAGNOSIS — N186 End stage renal disease: Secondary | ICD-10-CM | POA: Diagnosis not present

## 2018-12-02 ENCOUNTER — Inpatient Hospital Stay (HOSPITAL_COMMUNITY)
Admission: EM | Admit: 2018-12-02 | Discharge: 2018-12-04 | DRG: 871 | Disposition: A | Discharge status: Home / Self Care | Payer: BC Managed Care – PPO | Attending: Internal Medicine | Admitting: Internal Medicine

## 2018-12-02 ENCOUNTER — Encounter (HOSPITAL_COMMUNITY): Payer: Self-pay

## 2018-12-02 ENCOUNTER — Other Ambulatory Visit: Payer: Self-pay

## 2018-12-02 ENCOUNTER — Encounter: Payer: Self-pay | Admitting: *Deleted

## 2018-12-02 ENCOUNTER — Emergency Department (HOSPITAL_COMMUNITY): Payer: BC Managed Care – PPO

## 2018-12-02 DIAGNOSIS — I5032 Chronic diastolic (congestive) heart failure: Secondary | ICD-10-CM | POA: Diagnosis not present

## 2018-12-02 DIAGNOSIS — Z841 Family history of disorders of kidney and ureter: Secondary | ICD-10-CM

## 2018-12-02 DIAGNOSIS — J181 Lobar pneumonia, unspecified organism: Secondary | ICD-10-CM | POA: Diagnosis not present

## 2018-12-02 DIAGNOSIS — Z8249 Family history of ischemic heart disease and other diseases of the circulatory system: Secondary | ICD-10-CM

## 2018-12-02 DIAGNOSIS — Y95 Nosocomial condition: Secondary | ICD-10-CM | POA: Diagnosis present

## 2018-12-02 DIAGNOSIS — I10 Essential (primary) hypertension: Secondary | ICD-10-CM

## 2018-12-02 DIAGNOSIS — Z7982 Long term (current) use of aspirin: Secondary | ICD-10-CM

## 2018-12-02 DIAGNOSIS — Y830 Surgical operation with transplant of whole organ as the cause of abnormal reaction of the patient, or of later complication, without mention of misadventure at the time of the procedure: Secondary | ICD-10-CM | POA: Diagnosis present

## 2018-12-02 DIAGNOSIS — A419 Sepsis, unspecified organism: Secondary | ICD-10-CM | POA: Diagnosis not present

## 2018-12-02 DIAGNOSIS — E785 Hyperlipidemia, unspecified: Secondary | ICD-10-CM | POA: Diagnosis present

## 2018-12-02 DIAGNOSIS — Z20828 Contact with and (suspected) exposure to other viral communicable diseases: Secondary | ICD-10-CM | POA: Diagnosis not present

## 2018-12-02 DIAGNOSIS — I132 Hypertensive heart and chronic kidney disease with heart failure and with stage 5 chronic kidney disease, or end stage renal disease: Secondary | ICD-10-CM | POA: Diagnosis not present

## 2018-12-02 DIAGNOSIS — Q211 Atrial septal defect: Secondary | ICD-10-CM

## 2018-12-02 DIAGNOSIS — D631 Anemia in chronic kidney disease: Secondary | ICD-10-CM | POA: Diagnosis not present

## 2018-12-02 DIAGNOSIS — Z992 Dependence on renal dialysis: Secondary | ICD-10-CM

## 2018-12-02 DIAGNOSIS — R509 Fever, unspecified: Secondary | ICD-10-CM

## 2018-12-02 DIAGNOSIS — N186 End stage renal disease: Secondary | ICD-10-CM | POA: Diagnosis present

## 2018-12-02 DIAGNOSIS — I2510 Atherosclerotic heart disease of native coronary artery without angina pectoris: Secondary | ICD-10-CM | POA: Diagnosis not present

## 2018-12-02 DIAGNOSIS — T8612 Kidney transplant failure: Secondary | ICD-10-CM | POA: Diagnosis present

## 2018-12-02 DIAGNOSIS — J189 Pneumonia, unspecified organism: Secondary | ICD-10-CM | POA: Diagnosis not present

## 2018-12-02 DIAGNOSIS — Z794 Long term (current) use of insulin: Secondary | ICD-10-CM

## 2018-12-02 DIAGNOSIS — J9601 Acute respiratory failure with hypoxia: Secondary | ICD-10-CM

## 2018-12-02 DIAGNOSIS — R531 Weakness: Secondary | ICD-10-CM | POA: Diagnosis not present

## 2018-12-02 DIAGNOSIS — Z7952 Long term (current) use of systemic steroids: Secondary | ICD-10-CM | POA: Diagnosis not present

## 2018-12-02 DIAGNOSIS — R197 Diarrhea, unspecified: Secondary | ICD-10-CM | POA: Diagnosis present

## 2018-12-02 DIAGNOSIS — N390 Urinary tract infection, site not specified: Secondary | ICD-10-CM | POA: Diagnosis not present

## 2018-12-02 DIAGNOSIS — E8889 Other specified metabolic disorders: Secondary | ICD-10-CM | POA: Diagnosis present

## 2018-12-02 DIAGNOSIS — Z79899 Other long term (current) drug therapy: Secondary | ICD-10-CM | POA: Diagnosis not present

## 2018-12-02 DIAGNOSIS — E1165 Type 2 diabetes mellitus with hyperglycemia: Secondary | ICD-10-CM | POA: Diagnosis not present

## 2018-12-02 DIAGNOSIS — E1122 Type 2 diabetes mellitus with diabetic chronic kidney disease: Secondary | ICD-10-CM | POA: Diagnosis not present

## 2018-12-02 DIAGNOSIS — D649 Anemia, unspecified: Secondary | ICD-10-CM | POA: Diagnosis present

## 2018-12-02 DIAGNOSIS — Z9862 Peripheral vascular angioplasty status: Secondary | ICD-10-CM | POA: Diagnosis not present

## 2018-12-02 DIAGNOSIS — N2581 Secondary hyperparathyroidism of renal origin: Secondary | ICD-10-CM | POA: Diagnosis present

## 2018-12-02 DIAGNOSIS — I120 Hypertensive chronic kidney disease with stage 5 chronic kidney disease or end stage renal disease: Secondary | ICD-10-CM | POA: Diagnosis not present

## 2018-12-02 DIAGNOSIS — R0602 Shortness of breath: Secondary | ICD-10-CM | POA: Diagnosis not present

## 2018-12-02 DIAGNOSIS — R1110 Vomiting, unspecified: Secondary | ICD-10-CM

## 2018-12-02 DIAGNOSIS — Z833 Family history of diabetes mellitus: Secondary | ICD-10-CM

## 2018-12-02 DIAGNOSIS — R918 Other nonspecific abnormal finding of lung field: Secondary | ICD-10-CM | POA: Diagnosis not present

## 2018-12-02 HISTORY — DX: Acute respiratory failure with hypoxia: J96.01

## 2018-12-02 LAB — SARS CORONAVIRUS 2 BY RT PCR (HOSPITAL ORDER, PERFORMED IN ~~LOC~~ HOSPITAL LAB): SARS Coronavirus 2: NEGATIVE

## 2018-12-02 LAB — URINALYSIS, ROUTINE W REFLEX MICROSCOPIC
Bilirubin Urine: NEGATIVE
Glucose, UA: 150 mg/dL — AB
Ketones, ur: NEGATIVE mg/dL
Nitrite: NEGATIVE
Protein, ur: 100 mg/dL — AB
Specific Gravity, Urine: 1.013 (ref 1.005–1.030)
pH: 7 (ref 5.0–8.0)

## 2018-12-02 LAB — COMPREHENSIVE METABOLIC PANEL
ALT: 37 U/L (ref 0–44)
AST: 30 U/L (ref 15–41)
Albumin: 3.1 g/dL — ABNORMAL LOW (ref 3.5–5.0)
Alkaline Phosphatase: 92 U/L (ref 38–126)
Anion gap: 16 — ABNORMAL HIGH (ref 5–15)
BUN: 68 mg/dL — ABNORMAL HIGH (ref 8–23)
CO2: 22 mmol/L (ref 22–32)
Calcium: 8.4 mg/dL — ABNORMAL LOW (ref 8.9–10.3)
Chloride: 96 mmol/L — ABNORMAL LOW (ref 98–111)
Creatinine, Ser: 9.07 mg/dL — ABNORMAL HIGH (ref 0.44–1.00)
GFR calc Af Amer: 5 mL/min — ABNORMAL LOW (ref >60)
GFR calc non Af Amer: 4 mL/min — ABNORMAL LOW (ref >60)
Glucose, Bld: 214 mg/dL — ABNORMAL HIGH (ref 70–99)
Potassium: 4.6 mmol/L (ref 3.5–5.1)
Sodium: 134 mmol/L — ABNORMAL LOW (ref 135–145)
Total Bilirubin: 0.7 mg/dL (ref 0.3–1.2)
Total Protein: 6.7 g/dL (ref 6.5–8.1)

## 2018-12-02 LAB — CBC WITH DIFFERENTIAL/PLATELET
Abs Immature Granulocytes: 0.11 10*3/uL — ABNORMAL HIGH (ref 0.00–0.07)
Basophils Absolute: 0.1 10*3/uL (ref 0.0–0.1)
Basophils Relative: 1 %
Eosinophils Absolute: 0.2 10*3/uL (ref 0.0–0.5)
Eosinophils Relative: 1 %
HCT: 32.8 % — ABNORMAL LOW (ref 36.0–46.0)
Hemoglobin: 10.5 g/dL — ABNORMAL LOW (ref 12.0–15.0)
Immature Granulocytes: 1 %
Lymphocytes Relative: 4 %
Lymphs Abs: 0.7 10*3/uL (ref 0.7–4.0)
MCH: 31.8 pg (ref 26.0–34.0)
MCHC: 32 g/dL (ref 30.0–36.0)
MCV: 99.4 fL (ref 80.0–100.0)
Monocytes Absolute: 0.9 10*3/uL (ref 0.1–1.0)
Monocytes Relative: 5 %
Neutro Abs: 15 10*3/uL — ABNORMAL HIGH (ref 1.7–7.7)
Neutrophils Relative %: 88 %
Platelets: 183 10*3/uL (ref 150–400)
RBC: 3.3 MIL/uL — ABNORMAL LOW (ref 3.87–5.11)
RDW: 13.2 % (ref 11.5–15.5)
WBC: 16.9 10*3/uL — ABNORMAL HIGH (ref 4.0–10.5)
nRBC: 0 % (ref 0.0–0.2)

## 2018-12-02 LAB — HEMOGLOBIN A1C
Hgb A1c MFr Bld: 9.4 % — ABNORMAL HIGH (ref 4.8–5.6)
Mean Plasma Glucose: 223.08 mg/dL

## 2018-12-02 LAB — GLUCOSE, CAPILLARY
Glucose-Capillary: 170 mg/dL — ABNORMAL HIGH (ref 70–99)
Glucose-Capillary: 124 mg/dL — ABNORMAL HIGH (ref 70–99)

## 2018-12-02 LAB — LACTIC ACID, PLASMA
Lactic Acid, Venous: 1.6 mmol/L (ref 0.5–1.9)
Lactic Acid, Venous: 1 mmol/L (ref 0.5–1.9)
Lactic Acid, Venous: 0.8 mmol/L (ref 0.5–1.9)

## 2018-12-02 LAB — PROTIME-INR
INR: 1.3 — ABNORMAL HIGH (ref 0.8–1.2)
Prothrombin Time: 16.2 seconds — ABNORMAL HIGH (ref 11.4–15.2)

## 2018-12-02 LAB — PROCALCITONIN: Procalcitonin: 6.83 ng/mL

## 2018-12-02 LAB — MRSA PCR SCREENING: MRSA by PCR: NEGATIVE

## 2018-12-02 LAB — CBG MONITORING, ED: Glucose-Capillary: 204 mg/dL — ABNORMAL HIGH (ref 70–99)

## 2018-12-02 MED ORDER — ASPIRIN EC 81 MG PO TBEC
81.0000 mg | DELAYED_RELEASE_TABLET | Freq: Every day | ORAL | Status: DC
  Administered 2018-12-02 – 2018-12-04 (×3): 81 mg via ORAL
  Filled 2018-12-02 (×5): qty 1

## 2018-12-02 MED ORDER — VANCOMYCIN HCL IN DEXTROSE 1-5 GM/200ML-% IV SOLN
1000.0000 mg | INTRAVENOUS | Status: DC
  Administered 2018-12-02: 1000 mg via INTRAVENOUS
  Filled 2018-12-02: qty 200

## 2018-12-02 MED ORDER — ROSUVASTATIN CALCIUM 20 MG PO TABS
20.0000 mg | ORAL_TABLET | Freq: Every day | ORAL | Status: DC
  Administered 2018-12-02 – 2018-12-04 (×3): 20 mg via ORAL
  Filled 2018-12-02 (×5): qty 1

## 2018-12-02 MED ORDER — PREDNISONE 20 MG PO TABS
20.0000 mg | ORAL_TABLET | Freq: Every day | ORAL | Status: DC
  Administered 2018-12-02: 20 mg via ORAL
  Filled 2018-12-02: qty 1

## 2018-12-02 MED ORDER — ONDANSETRON HCL 4 MG PO TABS: 4.0000 mg | ORAL_TABLET | Freq: Four times a day (QID) | ORAL | Status: DC | PRN

## 2018-12-02 MED ORDER — METOPROLOL TARTRATE 25 MG PO TABS
12.5000 mg | ORAL_TABLET | Freq: Two times a day (BID) | ORAL | Status: DC
  Administered 2018-12-02 – 2018-12-04 (×5): 12.5 mg via ORAL
  Filled 2018-12-02 (×6): qty 1

## 2018-12-02 MED ORDER — SEVELAMER CARBONATE 800 MG PO TABS
3200.0000 mg | ORAL_TABLET | Freq: Three times a day (TID) | ORAL | Status: DC
  Administered 2018-12-03 – 2018-12-04 (×5): 3200 mg via ORAL
  Filled 2018-12-02 (×9): qty 4

## 2018-12-02 MED ORDER — SODIUM CHLORIDE 0.9 % IV SOLN
INTRAVENOUS | Status: DC | PRN
  Administered 2018-12-02: 250 mL via INTRAVENOUS
  Administered 2018-12-02 – 2018-12-03 (×2): 500 mL via INTRAVENOUS

## 2018-12-02 MED ORDER — TACROLIMUS 0.5 MG PO CAPS
1.0000 mg | ORAL_CAPSULE | Freq: Every morning | ORAL | Status: DC
  Administered 2018-12-02 – 2018-12-04 (×3): 1 mg via ORAL
  Filled 2018-12-02 (×4): qty 2

## 2018-12-02 MED ORDER — SODIUM CHLORIDE 0.9 % IV SOLN: 2.0000 g | Freq: Once | INTRAVENOUS | Status: DC

## 2018-12-02 MED ORDER — SODIUM CHLORIDE 0.9 % IV SOLN: 100.0000 mL | INTRAVENOUS | Status: DC | PRN

## 2018-12-02 MED ORDER — VANCOMYCIN HCL IN DEXTROSE 1-5 GM/200ML-% IV SOLN: 1000.0000 mg | Freq: Once | INTRAVENOUS | Status: DC

## 2018-12-02 MED ORDER — LIDOCAINE HCL (PF) 1 % IJ SOLN: 5.0000 mL | INTRAMUSCULAR | Status: DC | PRN

## 2018-12-02 MED ORDER — ACETAMINOPHEN 325 MG PO TABS
650.0000 mg | ORAL_TABLET | Freq: Four times a day (QID) | ORAL | Status: DC | PRN
  Administered 2018-12-02 (×2): 650 mg via ORAL
  Filled 2018-12-02 (×2): qty 2

## 2018-12-02 MED ORDER — LIDOCAINE-PRILOCAINE 2.5-2.5 % EX CREA: 1.0000 "application " | TOPICAL_CREAM | CUTANEOUS | Status: DC | PRN

## 2018-12-02 MED ORDER — VANCOMYCIN HCL 10 G IV SOLR
1500.0000 mg | Freq: Once | INTRAVENOUS | Status: DC
  Filled 2018-12-02: qty 1500

## 2018-12-02 MED ORDER — SODIUM CHLORIDE 0.9 % IV SOLN
1.0000 g | INTRAVENOUS | Status: DC
  Administered 2018-12-02 – 2018-12-03 (×2): 1 g via INTRAVENOUS
  Filled 2018-12-02 (×3): qty 1

## 2018-12-02 MED ORDER — ACETAMINOPHEN 650 MG RE SUPP: 650.0000 mg | Freq: Four times a day (QID) | RECTAL | Status: DC | PRN

## 2018-12-02 MED ORDER — SEVELAMER CARBONATE 800 MG PO TABS
1600.0000 mg | ORAL_TABLET | ORAL | Status: DC
  Filled 2018-12-02 (×6): qty 2

## 2018-12-02 MED ORDER — TACROLIMUS 0.5 MG PO CAPS
0.5000 mg | ORAL_CAPSULE | Freq: Every day | ORAL | Status: DC
  Administered 2018-12-02 – 2018-12-03 (×2): 0.5 mg via ORAL
  Filled 2018-12-02 (×3): qty 1

## 2018-12-02 MED ORDER — INSULIN ASPART 100 UNIT/ML ~~LOC~~ SOLN
0.0000 [IU] | Freq: Three times a day (TID) | SUBCUTANEOUS | Status: DC
  Administered 2018-12-02: 3 [IU] via SUBCUTANEOUS
  Administered 2018-12-03: 9 [IU] via SUBCUTANEOUS
  Administered 2018-12-03: 7 [IU] via SUBCUTANEOUS
  Administered 2018-12-03: 3 [IU] via SUBCUTANEOUS
  Administered 2018-12-04: 5 [IU] via SUBCUTANEOUS
  Filled 2018-12-02: qty 1

## 2018-12-02 MED ORDER — VANCOMYCIN HCL 1.5 G IV SOLR
1500.0000 mg | Freq: Once | INTRAVENOUS | Status: AC
  Administered 2018-12-02: 1500 mg via INTRAVENOUS
  Filled 2018-12-02: qty 1500

## 2018-12-02 MED ORDER — INSULIN GLARGINE 100 UNIT/ML ~~LOC~~ SOLN
5.0000 [IU] | Freq: Every day | SUBCUTANEOUS | Status: DC
  Administered 2018-12-02 – 2018-12-04 (×3): 5 [IU] via SUBCUTANEOUS
  Filled 2018-12-02 (×4): qty 0.05

## 2018-12-02 MED ORDER — METRONIDAZOLE IN NACL 5-0.79 MG/ML-% IV SOLN
500.0000 mg | Freq: Once | INTRAVENOUS | Status: AC
  Administered 2018-12-02: 500 mg via INTRAVENOUS
  Filled 2018-12-02: qty 100

## 2018-12-02 MED ORDER — PENTAFLUOROPROP-TETRAFLUOROETH EX AERO: 1.0000 "application " | INHALATION_SPRAY | CUTANEOUS | Status: DC | PRN

## 2018-12-02 MED ORDER — SODIUM CHLORIDE 0.9 % IV SOLN
2.0000 g | Freq: Once | INTRAVENOUS | Status: AC
  Administered 2018-12-02: 2 g via INTRAVENOUS
  Filled 2018-12-02: qty 2

## 2018-12-02 MED ORDER — HEPARIN SODIUM (PORCINE) 5000 UNIT/ML IJ SOLN
5000.0000 [IU] | Freq: Three times a day (TID) | INTRAMUSCULAR | Status: DC
  Administered 2018-12-02 – 2018-12-04 (×5): 5000 [IU] via SUBCUTANEOUS
  Filled 2018-12-02 (×6): qty 1

## 2018-12-02 MED ORDER — ONDANSETRON HCL 4 MG/2ML IJ SOLN
4.0000 mg | Freq: Four times a day (QID) | INTRAMUSCULAR | Status: DC | PRN
  Administered 2018-12-02: 4 mg via INTRAVENOUS
  Filled 2018-12-02: qty 2

## 2018-12-02 MED ORDER — CHLORHEXIDINE GLUCONATE CLOTH 2 % EX PADS
6.0000 | MEDICATED_PAD | Freq: Every day | CUTANEOUS | Status: DC
  Administered 2018-12-03 – 2018-12-04 (×2): 6 via TOPICAL

## 2018-12-02 MED ORDER — INSULIN ASPART 100 UNIT/ML ~~LOC~~ SOLN
0.0000 [IU] | Freq: Every day | SUBCUTANEOUS | Status: DC
  Administered 2018-12-03: 5 [IU] via SUBCUTANEOUS

## 2018-12-02 NOTE — ED Provider Notes (Signed)
Care assumed from Dr. Betsey Holiday at shift change.  Patient with history of end-stage renal disease on hemodialysis Monday, Wednesday, and Friday.  She presents today for evaluation of shortness of breath, loose stools, and cough.  She was initially febrile upon presentation with a temperature of 102.6.  Care signed out to me awaiting results of laboratory studies and COVID testing.  Laboratory studies reveal a leukocytosis, but electrolytes that are consistent with baseline.  Her potassium is not elevated.  Chest x-ray shows what appears to be pneumonia, however COVID test is negative.  Patient given cefepime, vancomycin, and Flagyl as ordered by Dr. Betsey Holiday.    Patient remains hypoxic with oxygen requirement.  She will require admission to the hospital.  I have spoken with Dr. Carles Collet who agrees to admit.   Veryl Speak, MD 12/02/18 [phone]

## 2018-12-02 NOTE — ED Notes (Signed)
Pt unable to keep mouth close to tolerate [place]. Pt placed on non rebreather. Tolerated well. O2 sat 98%

## 2018-12-02 NOTE — ED Triage Notes (Signed)
PT reports vomiting and diarrhea that started yesterday, pt is on dialysis M, W, F. Last dialysis was Friday, pt was seen 3 days ago for vertigo and discharged. Pt has fever of 102.6 orally in triage.

## 2018-12-02 NOTE — Procedures (Signed)
    HEMODIALYSIS TREATMENT NOTE:  3.5 hour heparin-free dialysis session completed via left forearm AVF (16g/antegrade).  Unable to determine accurate pre-tx weight; scale inoperable.  1.7 liters removed without interruption in ultrafiltration. All blood was returned and hemostasis was achieved in 15 minutes.  [name], RN

## 2018-12-02 NOTE — H&P (Signed)
History and Physical  SHYLAR DRYMON [id number] DOB: [birth year] DOA: 12/02/2018   PCP: Sharion Balloon, FNP   Patient coming from: Home  Chief Complaint: genaralized weakness, sob  HPI:  [name] is a 63 y.o. 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  The patient recently visited the emergency department on 11/26/2018 with generalized weakness which was attributable to UTI.  The patient was discharged home with cephalexin and metronidazole.  Unfortunately, the patient continued to have generalized weakness and now presented with the above symptoms.  She had denied any hematemesis, hematochezia, melena, abdominal pain, dysuria, hematuria.  She denies any recent sick contacts.  The patient had dialysis for 4 hours on Friday.  She has been compliant with her medications.  She complains of a nonproductive cough but denies any hemoptysis. In the emergency department, the patient was febrile with a temperature of 102.6 F but was hemodynamically stable with oxygen saturation in the upper 80s on room air.  The patient was subsequently titrated up to a nonrebreather with oxygen saturation 100%.  WBC was 16.9.  Serum creatinine was 9.07 with potassium 4.6 and bicarb 22.  Chest x-ray showed increased interstitial markings and increased right basilar opacity.  COVID was negative.  The patient was given vancomycin, cefepime, metronidazole in the emergency department.  Assessment/Plan: Sepsis -Present at time of admission -Secondary to pneumonia, but cannot rule out bacteremia -Urine culture from 11/26/2018 showed less than 10,000 colonies of growth -Personally reviewed chest x-ray--increased interstitial markings, increased right  basilar opacity -Lactic acid 1.6 -Continue vancomycin and cefepime pending culture data  Lobar pneumonia -Check procalcitonin -Continue vancomycin and cefepime  Acute respiratory failure with hypoxia -Secondary to pneumonia and pulmonary edema -Stable without respiratory disease presently -Wean oxygen for saturation greater 90%  ESRD -Patient has failed renal transplant -Consult nephrology for maintenance dialysis  Failed renal transplant -Continue Prograf -Give stress dose steroids as she is on prednisone 5 mg daily  Diabetes mellitus type 2, uncontrolled with hyperglycemia -10/01/2018 hemoglobin A1c 9.7 -NovoLog sliding scale -Give reduced dose Lantus -Holding Trulicity  Essential hypertension -Holding amlodipine in the setting of sepsis -Continue metoprolol tartrate  Hyperlipidemia -Continue statin  Coronary artery disease -No chest pain presently -Continue Imdur -Personally reviewed EKG--sinus rhythm, no ST ST or changes       Past Medical History:  Diagnosis Date  . Anemia    of chronic disease  . Blood transfusion without reported diagnosis   . Cardiovascular disease    nonobstructive  . Carotid artery stenosis 2008  . CHF (congestive heart failure) ([place])   . Coronary artery disease   . Diabetes mellitus   . Dyslipidemia   . Edema of lower extremity    with hypo-albuminemia and profound protenuria  . Heart murmur   . Hypertension   . Mitral regurgitation   . Nephrotic syndrome   . Patent foramen ovale   . Pulmonary hypertension, moderate to severe ([place])   . Pulmonary nodule   . Tricuspid regurgitation   . Volume depletion, renal, due to output loss (renal deficit)    Past Surgical History:  Procedure Laterality Date  . A/V FISTULAGRAM N/A 04/05/2017   Procedure: A/V FISTULAGRAM - Left Arm;  Surgeon: Deitra Mayo  S, MD;  Location: [facility];  Service: Cardiovascular;  Laterality: N/A;  . A/V FISTULAGRAM Left 09/18/2017    Procedure: A/V FISTULAGRAM;  Surgeon: Serafina Mitchell, MD;  Location: [facility];  Service: Cardiovascular;  Laterality: Left;  . A/V SHUNT INTERVENTION Left 04/05/2017   Procedure: A/V SHUNT INTERVENTION;  Surgeon: Angelia Mould, MD;  Location: [facility];  Service: Cardiovascular;  Laterality: Left;  . CARDIAC CATHETERIZATION  2008  . IR REMOVAL TUN CV CATH W/O FL  11/02/2016  . 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 PERIPHERAL VASCULAR BALLOON ANGIOPLASTY Left 09/18/2017   Procedure: PERIPHERAL VASCULAR BALLOON ANGIOPLASTY;  Surgeon: Serafina Mitchell, MD;  Location: [facility];  Service: Cardiovascular;  Laterality: Left;  Arm Fistula   Social History:  reports that she has never smoked. She has never used smokeless tobacco. She reports that she does not drink alcohol or use drugs.   Family History  Problem Relation Age of Onset  . Kidney disease Mother   . Diabetes Mother   . Diabetes Father   . Heart disease Father      Allergies  Allergen Reactions  . Other Other (See Comments)    Tape Bruises and tears skin, Paper tape tolerated  . Tape Other (See Comments)    Bruises and tears skin Paper tape tolerated Bruises and tears skin Paper tape tolerated Bruises and tears skin Paper tape tolerated     Prior to Admission medications   Medication Sig Start Date End Date Taking? Authorizing Provider  acetaminophen (TYLENOL) 500 MG tablet Take 500 mg by mouth every 6 (six) hours as needed for moderate pain or headache.  04/06/16   [provider]  albuterol (PROVENTIL HFA;VENTOLIN HFA) 108 (90 Base) MCG/ACT inhaler Inhale 1-2 puffs into the lungs every 6 (six) hours as needed for wheezing or shortness of breath. 05/05/18   Triplett, Tammy, PA-C  amLODipine (NORVASC) 5 MG tablet Take 1 tablet by mouth nightly 11/25/18   Evelina Dun A, FNP  aspirin (ASPIR-LOW) 81 MG EC tablet Take 81 mg by mouth daily.     [provider]  B  Complex-C-Folic Acid (RENA-VITE RX) 1 MG TABS Take 1 tablet by mouth daily. 09/24/18   [provider]  cephALEXin (KEFLEX) 500 MG capsule Take 1 capsule (500 mg total) by mouth 2 (two) times daily. 11/26/18   Evalee Jefferson, PA-C  fluconazole (DIFLUCAN) 150 MG tablet TAKE ONE TABLET BY MOUTH AS A ONE TIME DOSE REPEAT DOSE IN 3 DAYS IF NEEDED 11/20/18   [provider]  fluticasone (FLONASE) 50 MCG/ACT nasal spray Place 2 sprays into both nostrils daily. 05/03/18   Evelina Dun A, FNP  insulin degludec (TRESIBA FLEXTOUCH) 100 UNIT/ML SOPN FlexTouch Pen Inject 0.1 mLs (10 Units total) into the skin daily at 10 pm. 11/05/18   Sharion Balloon, FNP  isosorbide mononitrate (IMDUR) 60 MG 24 hr tablet Take 1 tablet by mouth once daily 11/18/18   Evelina Dun A, FNP  lidocaine-prilocaine (EMLA) cream Apply 1 application topically as needed (for dialysis).  09/08/16   [provider]  meclizine (ANTIVERT) 25 MG tablet Take 1 tablet (25 mg total) by mouth 2 (two) times daily as needed for dizziness. 11/26/18   Evalee Jefferson, PA-C  metoprolol tartrate (LOPRESSOR) 25 MG tablet Take 1/2 (one-half) tablet by mouth twice daily 10/02/18   Evelina Dun A, FNP  metroNIDAZOLE (FLAGYL) 500 MG tablet Take 1 tablet (500 mg total)  by mouth 2 (two) times daily. 11/26/18   Evalee Jefferson, PA-C  ondansetron (ZOFRAN ODT) 4 MG disintegrating tablet 4mg  ODT q4 hours prn nausea/vomit Patient taking differently: Take 4 mg by mouth 2 (two) times daily. 4mg  ODT q4 hours prn nausea/vomit 11/26/17   Milton Ferguson, MD  predniSONE (DELTASONE) 5 MG tablet TAKE 1 TABLET BY MOUTH ONCE DAILY WITH BREAKFAST 04/22/18   Hawks, Christy A, FNP  PROGRAF 0.5 MG capsule Take 0.5-1 mg by mouth See admin instructions. Take 1 mg by mouth in the morning and take 0.5 mg by mouth at bedtime 10/24/16   [provider]  rosuvastatin (CRESTOR) 20 MG tablet Take 1 tablet by mouth once daily 10/28/18   Evelina Dun A, FNP  sevelamer  carbonate (RENVELA) 800 MG tablet Take 800 mg by mouth See admin instructions. 4 tablets with meals and 2 tablets with snacks. 10/02/16   [provider]  triamcinolone ointment (KENALOG) 0.5 % Apply 1 application topically 2 (two) times daily. 10/17/18   Sharion Balloon, FNP  TRULICITY 1.5 0000000 SOPN INJECT 1 DOSE (1.5 MG) SUBCUTANEOUSLY ONCE A WEEK 10/02/18   Evelina Dun A, FNP    Review of Systems:  Constitutional:  No weight loss, night sweats, Head&Eyes: No headache.  No vision loss.  No eye pain or scotoma ENT:  No Difficulty swallowing,Tooth/dental problems,Sore throat,  No ear ache, post nasal drip,  Cardio-vascular:  No chest pain, Orthopnea, PND, swelling in lower extremities,  dizziness, palpitations  GI:  No hematochezia, melena, heartburn, indigestion, Resp:   No coughing up of blood .No wheezing.No chest wall deformity  Skin:  no rash or lesions.  GU:  no dysuria, change in color of urine, no urgency or frequency. No flank pain.  Musculoskeletal:  No joint pain or swelling. No decreased range of motion. No back pain.  Psych:  No change in mood or affect. No depression or anxiety. Neurologic: No headache, no dysesthesia, no focal weakness, no vision loss. No syncope  Physical Exam: Vitals:   12/02/18 0700 12/02/18 0730 12/02/18 0830 12/02/18 0900  BP: (!) 166/54 (!) 149/55 (!) 135/51 (!) 145/53  Pulse: 85 84 78 80  Resp: 18 19 19 20   Temp:      TempSrc:      SpO2: 95% 95% 92% 91%  Weight:      Height:       General:  A&O x 3, NAD, nontoxic, pleasant/cooperative Head/Eye: No conjunctival hemorrhage, no icterus, [place]/AT, No nystagmus ENT:  No icterus,  No thrush, good dentition, no pharyngeal exudate Neck:  No masses, no lymphadenpathy, no bruits CV:  RRR, no rub, no gallop, no S3 Lung:  Bibasilar rales, no wheeze Abdomen: soft/NT, +BS, nondistended, no peritoneal signs Ext: No cyanosis, No rashes, No petechiae, No lymphangitis, trace LE edema  Neuro: CNII-XII intact, strength 4/5 in bilateral upper and lower extremities, no dysmetria  Labs on Admission:  Basic Metabolic Panel: Recent Labs  Lab 11/26/18 1520 12/02/18 0716  NA 136 134*  K 4.3 4.6  CL 96* 96*  CO2 28 22  GLUCOSE 360* 214*  BUN 33* 68*  CREATININE 6.47* 9.07*  CALCIUM 8.5* 8.4*   Liver Function Tests: Recent Labs  Lab 11/26/18 1520 12/02/18 0716  AST 39 30  ALT 35 37  ALKPHOS 90 92  BILITOT 0.7 0.7  PROT 7.3 6.7  ALBUMIN 3.4* 3.1*   No results for input(s): LIPASE, AMYLASE in the last 168 hours. No results for input(s): AMMONIA in  the last 168 hours. CBC: Recent Labs  Lab 11/26/18 1520 12/02/18 0716  WBC 10.0 16.9*  NEUTROABS 8.4* 15.0*  HGB 10.2* 10.5*  HCT 33.1* 32.8*  MCV 100.3* 99.4  PLT 184 183   Coagulation Profile: Recent Labs  Lab 12/02/18 0716  INR 1.3*   Cardiac Enzymes: No results for input(s): CKTOTAL, CKMB, CKMBINDEX, TROPONINI in the last 168 hours. BNP: Invalid input(s): POCBNP CBG: No results for input(s): GLUCAP in the last 168 hours. Urine analysis:    Component Value Date/Time   COLORURINE YELLOW 11/26/2018 1700   APPEARANCEUR CLOUDY (A) 11/26/2018 1700   APPEARANCEUR Cloudy (A) 11/25/2018 1202   LABSPEC 1.016 11/26/2018 1700   PHURINE 5.0 11/26/2018 1700   GLUCOSEU 150 (A) 11/26/2018 1700   HGBUR SMALL (A) 11/26/2018 1700   BILIRUBINUR NEGATIVE 11/26/2018 1700   BILIRUBINUR Negative 11/25/2018 1202   KETONESUR NEGATIVE 11/26/2018 1700   PROTEINUR 30 (A) 11/26/2018 1700   NITRITE NEGATIVE 11/26/2018 1700   LEUKOCYTESUR LARGE (A) 11/26/2018 1700   Sepsis Labs: @LABRCNTIP (procalcitonin:4,lacticidven:4) ) Recent Results (from the past 240 hour(s))  WET PREP FOR TRICH, YEAST, CLUE     Status: Abnormal   Collection Time: 11/25/18 12:02 PM   Specimen: Vaginal Fluid   VAGINAL FLUI  Result Value Ref Range Status   Trichomonas Exam Negative Negative Final   Yeast Exam Negative Negative Final   Clue  Cell Exam Positive (A) Negative Final    Comment: wbc  >25 bacteria  many epi's  moderate   Urine Culture     Status: None   Collection Time: 11/25/18 12:02 PM   Specimen: Urine   UR  Result Value Ref Range Status   Urine Culture, Routine Final report  Final   Organism ID, Bacteria Comment  Final    Comment: Mixed urogenital flora Less than 10,000 colonies/mL   Microscopic Examination     Status: Abnormal   Collection Time: 11/25/18 12:02 PM   URINE  Result Value Ref Range Status   WBC, UA >30 (A) 0 - 5 /hpf Final   RBC 3-10 (A) 0 - 2 /hpf Final   Epithelial Cells (non renal) 0-10 0 - 10 /hpf Final   Renal Epithel, UA 0-10 (A) None seen /hpf Final   Bacteria, UA Moderate (A) None seen/Few Final  Urine Culture     Status: Abnormal   Collection Time: 11/26/18  5:00 PM   Specimen: Urine, Random  Result Value Ref Range Status   Specimen Description   Final    URINE, RANDOM Performed at [facility], [address], [place], [place] [address]    Special Requests   Final    Normal Performed at [facility], [address], [place], [place] [address]    Culture (A)  Final    <10,000 COLONIES/mL INSIGNIFICANT GROWTH Performed at [facility], [place] [address] [place],  [address]    Report Status 11/28/2018 FINAL  Final  SARS Coronavirus 2 [facility] order, Performed in [facility] hospital lab) Nasopharyngeal Nasopharyngeal Swab     Status: None   Collection Time: 12/02/18  6:26 AM   Specimen: Nasopharyngeal Swab  Result Value Ref Range Status   SARS Coronavirus 2 NEGATIVE NEGATIVE Final    Comment: (NOTE) If result is NEGATIVE SARS-CoV-2 target nucleic acids are NOT DETECTED. The SARS-CoV-2 RNA is generally detectable in upper and lower  respiratory specimens during the acute phase of infection. The lowest  concentration of SARS-CoV-2 viral copies this assay can  detect is 250  copies / mL. A negative result does not preclude SARS-CoV-2 infection  and should not  be used as the sole basis for treatment or other  patient management decisions.  A negative result may occur with  improper specimen collection / handling, submission of specimen other  than nasopharyngeal swab, presence of viral mutation(s) within the  areas targeted by this assay, and inadequate number of viral copies  (<250 copies / mL). A negative result must be combined with clinical  observations, patient history, and epidemiological information. If result is POSITIVE SARS-CoV-2 target nucleic acids are DETECTED. The SARS-CoV-2 RNA is generally detectable in upper and lower  respiratory specimens dur ing the acute phase of infection.  Positive  results are indicative of active infection with SARS-CoV-2.  Clinical  correlation with patient history and other diagnostic information is  necessary to determine patient infection status.  Positive results do  not rule out bacterial infection or co-infection with other viruses. If result is PRESUMPTIVE POSTIVE SARS-CoV-2 nucleic acids MAY BE PRESENT.   A presumptive positive result was obtained on the submitted specimen  and confirmed on repeat testing.  While 2019 novel coronavirus  (SARS-CoV-2) nucleic acids may be present in the submitted sample  additional confirmatory testing may be necessary for epidemiological  and / or clinical management purposes  to differentiate between  SARS-CoV-2 and other Sarbecovirus currently known to infect humans.  If clinically indicated additional testing with an alternate test  methodology [phone]) is advised. The SARS-CoV-2 RNA is generally  detectable in upper and lower respiratory sp ecimens during the acute  phase of infection. The expected result is Negative. Fact Sheet for Patients:  StrictlyIdeas.no Fact Sheet for Healthcare Providers: BankingDealers.co.za This test is not yet approved or cleared by the [place] FDA and has been authorized  for detection and/or diagnosis of SARS-CoV-2 by FDA under an Emergency Use Authorization (EUA).  This EUA will remain in effect (meaning this test can be used) for the duration of the COVID-19 declaration under Section 564(b)(1) of the Act, 21 U.S.C. section 360bbb-3(b)(1), unless the authorization is terminated or revoked sooner. Performed at [facility], [address], [place], [place] [address]   Culture, blood (Routine x 2)     Status: None (Preliminary result)   Collection Time: 12/02/18  6:52 AM   Specimen: BLOOD RIGHT ARM  Result Value Ref Range Status   Specimen Description BLOOD RIGHT ARM  Final   Special Requests   Final    BOTTLES DRAWN AEROBIC AND ANAEROBIC Blood Culture results may not be optimal due to an inadequate volume of blood received in culture bottles Performed at [facility], [address], [place], [place] [address]    Culture PENDING  Incomplete   Report Status PENDING  Incomplete  Culture, blood (Routine x 2)     Status: None (Preliminary result)   Collection Time: 12/02/18  7:17 AM   Specimen: BLOOD RIGHT HAND  Result Value Ref Range Status   Specimen Description BLOOD RIGHT HAND  Final   Special Requests   Final    BOTTLES DRAWN AEROBIC AND ANAEROBIC Blood Culture results may not be optimal due to an inadequate volume of blood received in culture bottles Performed at [facility], [address] [place],  [address]    Culture PENDING  Incomplete   Report Status PENDING  Incomplete     Radiological Exams on Admission: Dg Chest Port 1 View  Result Date: 12/02/2018 CLINICAL DATA:  Sepsis EXAM: PORTABLE CHEST 1 VIEW COMPARISON:  June 27, 2018 FINDINGS: Heart size is enlarged. There are prominent bilateral interstitial lung markings. There may be small bilateral pleural effusions. There is no pneumothorax. There is no acute osseous abnormality. IMPRESSION: Cardiomegaly with prominent interstitial lung markings may be secondary to pulmonary edema or  an atypical infectious process. Electronically Signed   By: Constance Holster M.D.   On: 12/02/2018 07:56    EKG: Independently reviewed. Sinus, nonspecific T wave changes    Time spent:60 minutes Code Status:   FULL Family Communication:  No Family at bedside Disposition Plan: expect 2-3 day hospitalization Consults called: renal DVT Prophylaxis: [place] Heparin     Orson Eva, DO  [facility] Pager [phone]  If 7PM-7AM, please contact night-coverage [email or web address] Password [facility] 12/02/2018, 10:18 AM

## 2018-12-02 NOTE — ED Provider Notes (Addendum)
[facility] EMERGENCY DEPARTMENT Provider Note   CSN: [id number] Arrival date & time: 12/02/18  [id number]     History   Chief Complaint Chief Complaint  Patient presents with  . Emesis  . Diarrhea    HPI [name] is a 63 y.o. female.     Patient presents to the emergency department for evaluation of generalized weakness, nausea, vomiting, diarrhea.  She has had some intermittent right-sided abdominal cramping but no continuous abdominal pain.  Patient was recently treated for UTI with antibiotics, has not felt any better since starting treatment.  Patient's daughter is present in the room.  Patient states that she does feel slightly short of breath and daughter has noticed that she has been wheezing.  Patient does not have any chronic lung disease but does have a history of congestive heart failure and also is end-stage renal disease on dialysis MWF.     Past Medical History:  Diagnosis Date  . Anemia    of chronic disease  . Blood transfusion without reported diagnosis   . Cardiovascular disease    nonobstructive  . Carotid artery stenosis 2008  . CHF (congestive heart failure) ([place])   . Coronary artery disease   . Diabetes mellitus   . Dyslipidemia   . Edema of lower extremity    with hypo-albuminemia and profound protenuria  . Heart murmur   . Hypertension   . Mitral regurgitation   . Nephrotic syndrome   . Patent foramen ovale   . Pulmonary hypertension, moderate to severe ([place])   . Pulmonary nodule   . Tricuspid regurgitation   . Volume depletion, renal, due to output loss (renal deficit)     Patient Active Problem List   Diagnosis Date Noted  . Hydronephrosis   . Abnormal LFTs (liver function tests) 03/11/2018  . Type 2 diabetes mellitus ([place]) 03/11/2018  . Valvular heart disease 03/11/2018  . Chronic kidney disease on chronic dialysis ([place]) 11/13/2017  . Leukocytosis 09/21/2017  . Generalized weakness 09/21/2017  . Hyperlipidemia associated with  type 2 diabetes mellitus ([place]) 02/22/2017  . ESRD (end stage renal disease) ([place]) 08/25/2016  . 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 Encounter for aftercare following kidney transplant 05/22/2016  . Chronic diastolic heart failure ([place]) 06/08/2015  . Aortic valve sclerosis 06/08/2015  . Anemia of chronic disease 07/03/2013  . Hypertension associated with diabetes ([place]) 12/18/2010    Past Surgical History:  Procedure Laterality Date  . A/V FISTULAGRAM N/A 04/05/2017   Procedure: A/V FISTULAGRAM - Left Arm;  Surgeon: Angelia Mould, MD;  Location: [facility];  Service: Cardiovascular;  Laterality: N/A;  . A/V FISTULAGRAM Left 09/18/2017   Procedure: A/V FISTULAGRAM;  Surgeon: Serafina Mitchell, MD;  Location: [facility];  Service: Cardiovascular;  Laterality: Left;  . A/V SHUNT INTERVENTION Left 04/05/2017   Procedure: A/V SHUNT INTERVENTION;  Surgeon: Angelia Mould, MD;  Location: [facility];  Service: Cardiovascular;  Laterality: Left;  . CARDIAC CATHETERIZATION  2008  . IR REMOVAL TUN CV CATH W/O FL  11/02/2016  . 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 PERIPHERAL VASCULAR BALLOON ANGIOPLASTY Left 09/18/2017   Procedure: PERIPHERAL VASCULAR BALLOON ANGIOPLASTY;  Surgeon: Serafina Mitchell, MD;  Location: [facility];  Service: Cardiovascular;  Laterality: Left;  Arm Fistula     OB History   No obstetric history on file.      Home Medications    Prior to Admission medications  Medication Sig Start Date End Date Taking? Authorizing Provider  acetaminophen (TYLENOL) 500 MG tablet Take 500 mg by mouth every 6 (six) hours as needed for moderate pain or headache.  04/06/16   [provider]  albuterol (PROVENTIL HFA;VENTOLIN HFA) 108 (90 Base) MCG/ACT inhaler Inhale 1-2 puffs into the lungs every 6 (six) hours as needed for wheezing or shortness of breath. 05/05/18   Triplett, Tammy, PA-C  amLODipine (NORVASC) 5 MG  tablet Take 1 tablet by mouth nightly 11/25/18   Evelina Dun A, FNP  aspirin (ASPIR-LOW) 81 MG EC tablet Take 81 mg by mouth daily.     [provider]  B Complex-C-Folic Acid (RENA-VITE RX) 1 MG TABS Take 1 tablet by mouth daily. 09/24/18   [provider]  cephALEXin (KEFLEX) 500 MG capsule Take 1 capsule (500 mg total) by mouth 2 (two) times daily. 11/26/18   Evalee Jefferson, PA-C  fluconazole (DIFLUCAN) 150 MG tablet TAKE ONE TABLET BY MOUTH AS A ONE TIME DOSE REPEAT DOSE IN 3 DAYS IF NEEDED 11/20/18   [provider]  fluticasone (FLONASE) 50 MCG/ACT nasal spray Place 2 sprays into both nostrils daily. 05/03/18   Evelina Dun A, FNP  insulin degludec (TRESIBA FLEXTOUCH) 100 UNIT/ML SOPN FlexTouch Pen Inject 0.1 mLs (10 Units total) into the skin daily at 10 pm. 11/05/18   Sharion Balloon, FNP  isosorbide mononitrate (IMDUR) 60 MG 24 hr tablet Take 1 tablet by mouth once daily 11/18/18   Evelina Dun A, FNP  lidocaine-prilocaine (EMLA) cream Apply 1 application topically as needed (for dialysis).  09/08/16   [provider]  meclizine (ANTIVERT) 25 MG tablet Take 1 tablet (25 mg total) by mouth 2 (two) times daily as needed for dizziness. 11/26/18   Evalee Jefferson, PA-C  metoprolol tartrate (LOPRESSOR) 25 MG tablet Take 1/2 (one-half) tablet by mouth twice daily 10/02/18   Evelina Dun A, FNP  metroNIDAZOLE (FLAGYL) 500 MG tablet Take 1 tablet (500 mg total) by mouth 2 (two) times daily. 11/26/18   Evalee Jefferson, PA-C  ondansetron (ZOFRAN ODT) 4 MG disintegrating tablet 4mg  ODT q4 hours prn nausea/vomit Patient taking differently: Take 4 mg by mouth 2 (two) times daily. 4mg  ODT q4 hours prn nausea/vomit 11/26/17   Milton Ferguson, MD  predniSONE (DELTASONE) 5 MG tablet TAKE 1 TABLET BY MOUTH ONCE DAILY WITH BREAKFAST 04/22/18   Hawks, Christy A, FNP  PROGRAF 0.5 MG capsule Take 0.5-1 mg by mouth See admin instructions. Take 1 mg by mouth in the morning and take 0.5 mg by mouth  at bedtime 10/24/16   [provider]  rosuvastatin (CRESTOR) 20 MG tablet Take 1 tablet by mouth once daily 10/28/18   Evelina Dun A, FNP  sevelamer carbonate (RENVELA) 800 MG tablet Take 800 mg by mouth See admin instructions. 4 tablets with meals and 2 tablets with snacks. 10/02/16   [provider]  triamcinolone ointment (KENALOG) 0.5 % Apply 1 application topically 2 (two) times daily. 10/17/18   Sharion Balloon, FNP  TRULICITY 1.5 0000000 SOPN INJECT 1 DOSE (1.5 MG) SUBCUTANEOUSLY ONCE A WEEK 10/02/18   Sharion Balloon, FNP    Family History Family History  Problem Relation Age of Onset  . Kidney disease Mother   . Diabetes Mother   . Diabetes Father   . Heart disease Father     Social History Social History   Tobacco Use  . Smoking status: Never Smoker  . Smokeless tobacco: Never Used  Substance Use Topics  . Alcohol use: No  . Drug use: No     Allergies   Other and Tape   Review of Systems Review of Systems  Constitutional: Positive for fatigue.  Respiratory: Positive for shortness of breath.   Gastrointestinal: Positive for abdominal pain, diarrhea, nausea and vomiting.  All other systems reviewed and are negative.    Physical Exam Updated Vital Signs BP (!) 146/61   Pulse 79   Temp (!) 102.6 F (39.2 C) (Oral)   Resp (!) 22   Ht 5\' 9"  (1.753 m)   Wt 65.8 kg   SpO2 90%   BMI 21.41 kg/m   Physical Exam Vitals signs and nursing note reviewed.  Constitutional:      General: She is not in acute distress.    Appearance: Normal appearance. She is well-developed. She is ill-appearing.  HENT:     Head: Normocephalic and atraumatic.     Right Ear: Hearing normal.     Left Ear: Hearing normal.     Nose: Nose normal.  Eyes:     Conjunctiva/sclera: Conjunctivae normal.     Pupils: Pupils are equal, round, and reactive to light.  Neck:     Musculoskeletal: Normal range of motion and neck supple.  Cardiovascular:     Rate and  Rhythm: Regular rhythm.     Heart sounds: S1 normal and S2 normal. No murmur. No friction rub. No gallop.   Pulmonary:     Effort: Pulmonary effort is normal. Tachypnea present. No respiratory distress.     Breath sounds: Decreased breath sounds and rales present.  Chest:     Chest wall: No tenderness.  Abdominal:     General: Bowel sounds are normal.     Palpations: Abdomen is soft.     Tenderness: There is no abdominal tenderness. There is no guarding or rebound. Negative signs include Murphy's sign and McBurney's sign.     Hernia: No hernia is present.  Musculoskeletal: Normal range of motion.  Skin:    General: Skin is warm and dry.     Findings: No rash.  Neurological:     Mental Status: She is alert and oriented to person, place, and time.     GCS: GCS eye subscore is 4. GCS verbal subscore is 5. GCS motor subscore is 6.     Cranial Nerves: No cranial nerve deficit.     Sensory: No sensory deficit.     Coordination: Coordination normal.  Psychiatric:        Speech: Speech normal.        Behavior: Behavior normal.        Thought Content: Thought content normal.      ED Treatments / Results  Labs (all labs ordered are listed, but only abnormal results are displayed) Labs Reviewed  CULTURE, BLOOD (ROUTINE X 2)  CULTURE, BLOOD (ROUTINE X 2)  URINE CULTURE  SARS CORONAVIRUS 2 (HOSPITAL ORDER, [facility])  C DIFFICILE QUICK SCREEN W PCR REFLEX  COMPREHENSIVE METABOLIC PANEL  LACTIC ACID, PLASMA  LACTIC ACID, PLASMA  CBC WITH DIFFERENTIAL/PLATELET  PROTIME-INR  URINALYSIS, ROUTINE W REFLEX MICROSCOPIC  I-STAT CHEM 8, ED    EKG None ED ECG REPORT   Date: 12/02/2018  Rate: 89  Rhythm: normal sinus rhythm  QRS Axis: left  Intervals: normal  ST/T Wave abnormalities: T waves peaked  Conduction Disutrbances:LVH  Narrative Interpretation:   Old EKG Reviewed: unchanged  I have personally reviewed the EKG  tracing and agree with the  computerized printout as noted.   Radiology No results found.  Procedures Procedures (including critical care time)  Medications Ordered in ED Medications - No data to display   Initial Impression / Assessment and Plan / ED Course  I have reviewed the triage vital signs and the nursing notes.  Pertinent labs & imaging results that were available during my care of the patient were reviewed by me and considered in my medical decision making (see chart for details).        Patient presents to the emergency department for evaluation of generalized weakness with nausea, vomiting, diarrhea.  She was seen 3 days ago in the ED and diagnosed with possible vertigo but also a UTI, started on Keflex.  Patient now experiencing a GI symptoms.  Abdominal exam does not suggest an acute surgical process.  With recent antibiotic use and GI symptoms, consider C. difficile.  She has a fever, also consider COVID-19 as well as other bacterial infections.  She is immunocompromised.  Although her graft is no longer functioning she is still on Prograf.  She does have some mild shortness of breath and decreased oxygen saturations, consider volume overload.  Initial work-up ordered and is pending, will sign off to the oncoming ER physician to follow-up results and treat/disposition patient accordingly.  Final Clinical Impressions(s) / ED Diagnoses   Final diagnoses:  Fever, unspecified fever cause    ED Discharge Orders    None       Julius Boniface, Gwenyth Allegra, MD 12/02/18 [id number]    Orpah Greek, MD 12/02/18 [phone]

## 2018-12-02 NOTE — Progress Notes (Signed)
Pharmacy Antibiotic Note  [name] is a 63 y.o. female admitted on 12/02/2018 with pneumonia.  Pharmacy has been consulted for Vancomycin and cefepime dosing.  Plan: Vancomycin 1500mg  loading dose, then 1000mg  after each HD session on MWF Cefepime 2gm IV loading dose, then 1gm IV q24h F/U cxs and clinical progress Monitor V/S, labs and levels as indicated  Height: 5\' 9"  (175.3 cm) Weight: 145 lb (65.8 kg) IBW/kg (Calculated) : 66.2  Temp (24hrs), Avg:102.6 F (39.2 C), Min:102.6 F (39.2 C), Max:102.6 F (39.2 C)  Recent Labs  Lab 11/26/18 1520 12/02/18 0652 12/02/18 0716 12/02/18 0917  WBC 10.0  --  16.9*  --   CREATININE 6.47*  --  9.07*  --   LATICACIDVEN  --  1.6  --  1.0    Estimated Creatinine Clearance: 6.5 mL/min (A) (by C-G formula based on SCr of 9.07 mg/dL (H)).    Allergies  Allergen Reactions  . Other Other (See Comments)    Tape Bruises and tears skin, Paper tape tolerated  . Tape Other (See Comments)    Bruises and tears skin Paper tape tolerated Bruises and tears skin Paper tape tolerated Bruises and tears skin Paper tape tolerated    Antimicrobials this admission: Vancomycin 9/28>>  Cefepime 9/28 >>   Dose adjustments this admission: HD patient  Microbiology results: 9/28 BCx: pending  MRSA PCR:  Thank you for allowing pharmacy to be a part of this patient's care.  Isac Sarna, BS Pharm D, BCPS Clinical Pharmacist Pager [phone] 12/02/2018 2:18 PM

## 2018-12-02 NOTE — Progress Notes (Signed)
ANTIBIOTIC CONSULT NOTE-Preliminary  Pharmacy Consult for vancomycin Indication: sepsis  Allergies  Allergen Reactions  . Other Other (See Comments)    Tape Bruises and tears skin, Paper tape tolerated  . Tape Other (See Comments)    Bruises and tears skin Paper tape tolerated Bruises and tears skin Paper tape tolerated Bruises and tears skin Paper tape tolerated    Patient Measurements: Height: 5\' 9"  (175.3 cm) Weight: 145 lb (65.8 kg) IBW/kg (Calculated) : 66.2 Adjusted Body Weight:   Vital Signs: Temp: 102.6 F (39.2 C) (09/28 0614) Temp Source: Oral (09/28 0614) BP: 166/54 (09/28 0700) Pulse Rate: 85 (09/28 0700)  Labs: Recent Labs    12/02/18 0716  WBC 16.9*  HGB 10.5*  PLT 183    Estimated Creatinine Clearance: 9.1 mL/min (A) (by C-G formula based on SCr of 6.47 mg/dL (H)).  No results for input(s): VANCOTROUGH, VANCOPEAK, VANCORANDOM, GENTTROUGH, GENTPEAK, GENTRANDOM, TOBRATROUGH, TOBRAPEAK, TOBRARND, AMIKACINPEAK, AMIKACINTROU, AMIKACIN in the last 72 hours.   Microbiology: Recent Results (from the past 720 hour(s))  WET PREP FOR [place], YEAST, CLUE     Status: Abnormal   Collection Time: 11/25/18 12:02 PM   Specimen: Vaginal Fluid   VAGINAL FLUI  Result Value Ref Range Status   Trichomonas Exam Negative Negative Final   Yeast Exam Negative Negative Final   Clue Cell Exam Positive (A) Negative Final    Comment: wbc  >25 bacteria  many epi's  moderate   Urine Culture     Status: None   Collection Time: 11/25/18 12:02 PM   Specimen: Urine   UR  Result Value Ref Range Status   Urine Culture, Routine Final report  Final   Organism ID, Bacteria Comment  Final    Comment: Mixed urogenital flora Less than 10,000 colonies/mL   Microscopic Examination     Status: Abnormal   Collection Time: 11/25/18 12:02 PM   URINE  Result Value Ref Range Status   WBC, UA >30 (A) 0 - 5 /hpf Final   RBC 3-10 (A) 0 - 2 /hpf Final   Epithelial Cells (non renal)  0-10 0 - 10 /hpf Final   Renal Epithel, UA 0-10 (A) None seen /hpf Final   Bacteria, UA Moderate (A) None seen/Few Final  Urine Culture     Status: Abnormal   Collection Time: 11/26/18  5:00 PM   Specimen: Urine, Random  Result Value Ref Range Status   Specimen Description   Final    URINE, RANDOM Performed at [facility], [address], [place], [place] [address]    Special Requests   Final    Normal Performed at [facility], [address] [place], [place] [address]    Culture (A)  Final    <10,000 COLONIES/mL INSIGNIFICANT GROWTH Performed at [facility], [place] [address] [place], [place] [address]    Report Status 11/28/2018 FINAL  Final    Medical History: Past Medical History:  Diagnosis Date  . Anemia    of chronic disease  . Blood transfusion without reported diagnosis   . Cardiovascular disease    nonobstructive  . Carotid artery stenosis 2008  . CHF (congestive heart failure) ([place])   . Coronary artery disease   . Diabetes mellitus   . Dyslipidemia   . Edema of lower extremity    with hypo-albuminemia and profound protenuria  . Heart murmur   . Hypertension   . Mitral regurgitation   . Nephrotic syndrome   . Patent foramen ovale   .  Pulmonary hypertension, moderate to severe ([place])   . Pulmonary nodule   . Tricuspid regurgitation   . Volume depletion, renal, due to output loss (renal deficit)     Medications:  Anti-infectives (From admission, onward)   Start     Dose/Rate Route Frequency Ordered Stop   12/02/18 0745  vancomycin (VANCOCIN) 1,500 mg in sodium chloride 0.9 % 500 mL IVPB     1,500 mg 250 mL/hr over 120 Minutes Intravenous  Once 12/02/18 0741     12/02/18 0730  ceFEPIme (MAXIPIME) 2 g in sodium chloride 0.9 % 100 mL IVPB     2 g 200 mL/hr over 30 Minutes Intravenous  Once 12/02/18 0720     12/02/18 0730  metroNIDAZOLE (FLAGYL) IVPB 500 mg     500 mg 100 mL/hr over 60 Minutes Intravenous  Once 12/02/18 0720     12/02/18 0730   vancomycin (VANCOCIN) IVPB 1000 mg/200 mL premix  Status:  Discontinued     1,000 mg 200 mL/hr over 60 Minutes Intravenous  Once 12/02/18 0720 12/02/18 0741      Assessment: 63 yo female on dialysis MWF. Seen in ED 3 days ago and diagnosed with UTI and vertigo.  She was started on Keflex.  Pharmacy has been asked to dose vancomycin for infeciton.   Goal of Therapy:  Vancomycin trough level 15-20 mcg/ml  Plan:  Preliminary review of pertinent patient information completed.  Protocol will be initiated with loading dose of vancomycin 1500mg  based on 0.9L/kg*16mcg/ml*65kg.  [name] clinical pharmacist will complete review during morning rounds to assess patient and finalize treatment regimen if needed.  Jawanda Passey, Magdalene Molly, RPH 12/02/2018,7:41 AM

## 2018-12-02 NOTE — Consult Note (Signed)
[facility] Renal Consultation Note    Indication for Consultation:  Management of ESRD/hemodialysis; anemia, hypertension/volume and secondary hyperparathyroidism  HPI: [name] is a 63 y.o. female. Ms. Bjorkman has a PMH sig for ESRD on HD s/p failed transplant, HTN, HLD, DM, CAD, tricuspid regurg, PFO who is now seen in consultation at the request of Dr. Carles Collet for management of ESRD and provision of HD.  She presented to [facility] ED today with 1 week history of generalized weakness, worsening SOB, cough, nausea, vomiting, diarrhea, and chills.  Of note, she presented on 11/26/18 with weakness and was treated for an UTI with cephalexin and metronidazole, however this did not improve which prompted her return to the ED.  Today she was noted to be febrile with temp of 102.6, covid-19 negative.  She was hypoxic on room air which improved with 100% non-rebreather.  She was admitted for sepsis with WBC of 16.9 and we were consulted to help provide HD while she remains an inpatient.    Past Medical History:  Diagnosis Date  . Anemia    of chronic disease  . Blood transfusion without reported diagnosis   . Cardiovascular disease    nonobstructive  . Carotid artery stenosis 2008  . CHF (congestive heart failure) ([place])   . Coronary artery disease   . Diabetes mellitus   . Dyslipidemia   . Edema of lower extremity    with hypo-albuminemia and profound protenuria  . Heart murmur   . Hypertension   . Mitral regurgitation   . Nephrotic syndrome   . Patent foramen ovale   . Pulmonary hypertension, moderate to severe ([place])   . Pulmonary nodule   . Tricuspid regurgitation   . Volume depletion, renal, due to output loss (renal deficit)    Past Surgical History:  Procedure Laterality Date  . A/V FISTULAGRAM N/A 04/05/2017   Procedure: A/V FISTULAGRAM - Left Arm;  Surgeon: Angelia Mould, MD;  Location: [place] [facility];  Service: Cardiovascular;  Laterality: N/A;  . A/V  FISTULAGRAM Left 09/18/2017   Procedure: A/V FISTULAGRAM;  Surgeon: Serafina Mitchell, MD;  Location: [place] [facility];  Service: Cardiovascular;  Laterality: Left;  . A/V SHUNT INTERVENTION Left 04/05/2017   Procedure: A/V SHUNT INTERVENTION;  Surgeon: Angelia Mould, MD;  Location: [place] [facility];  Service: Cardiovascular;  Laterality: Left;  . CARDIAC CATHETERIZATION  2008  . IR REMOVAL TUN CV CATH W/O FL  11/02/2016  . 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 PERIPHERAL VASCULAR BALLOON ANGIOPLASTY Left 09/18/2017   Procedure: PERIPHERAL VASCULAR BALLOON ANGIOPLASTY;  Surgeon: Serafina Mitchell, MD;  Location: [place] [facility];  Service: Cardiovascular;  Laterality: Left;  Arm Fistula   Family History:   Family History  Problem Relation Age of Onset  . Kidney disease Mother   . Diabetes Mother   . Diabetes Father   . Heart disease Father    Social History:  reports that she has never smoked. She has never used smokeless tobacco. She reports that she does not drink alcohol or use drugs. Allergies  Allergen Reactions  . Other Other (See Comments)    Tape Bruises and tears skin, Paper tape tolerated  . Tape Other (See Comments)    Bruises and tears skin Paper tape tolerated Bruises and tears skin Paper tape tolerated Bruises and tears skin Paper tape tolerated   Prior to Admission medications   Medication Sig Start Date End Date Taking? Authorizing Provider  acetaminophen (TYLENOL) 500 MG tablet Take 500 mg by mouth every 6 (six) hours as needed for moderate pain or headache.  04/06/16   [provider]  albuterol (PROVENTIL HFA;VENTOLIN HFA) 108 (90 Base) MCG/ACT inhaler Inhale 1-2 puffs into the lungs every 6 (six) hours as needed for wheezing or shortness of breath. 05/05/18   Triplett, Tammy, PA-C  amLODipine (NORVASC) 5 MG tablet Take 1 tablet by mouth nightly 11/25/18   Evelina Dun A, FNP  aspirin (ASPIR-LOW) 81 MG EC tablet Take 81 mg by mouth daily.      [provider]  B Complex-C-Folic Acid (RENA-VITE RX) 1 MG TABS Take 1 tablet by mouth daily. 09/24/18   [provider]  cephALEXin (KEFLEX) 500 MG capsule Take 1 capsule (500 mg total) by mouth 2 (two) times daily. 11/26/18   Evalee Jefferson, PA-C  fluconazole (DIFLUCAN) 150 MG tablet TAKE ONE TABLET BY MOUTH AS A ONE TIME DOSE REPEAT DOSE IN 3 DAYS IF NEEDED 11/20/18   [provider]  fluticasone (FLONASE) 50 MCG/ACT nasal spray Place 2 sprays into both nostrils daily. 05/03/18   Evelina Dun A, FNP  insulin degludec (TRESIBA FLEXTOUCH) 100 UNIT/ML SOPN FlexTouch Pen Inject 0.1 mLs (10 Units total) into the skin daily at 10 pm. 11/05/18   Sharion Balloon, FNP  isosorbide mononitrate (IMDUR) 60 MG 24 hr tablet Take 1 tablet by mouth once daily 11/18/18   Evelina Dun A, FNP  lidocaine-prilocaine (EMLA) cream Apply 1 application topically as needed (for dialysis).  09/08/16   [provider]  meclizine (ANTIVERT) 25 MG tablet Take 1 tablet (25 mg total) by mouth 2 (two) times daily as needed for dizziness. 11/26/18   Evalee Jefferson, PA-C  metoprolol tartrate (LOPRESSOR) 25 MG tablet Take 1/2 (one-half) tablet by mouth twice daily 10/02/18   Evelina Dun A, FNP  metroNIDAZOLE (FLAGYL) 500 MG tablet Take 1 tablet (500 mg total) by mouth 2 (two) times daily. 11/26/18   Evalee Jefferson, PA-C  ondansetron (ZOFRAN ODT) 4 MG disintegrating tablet 4mg  ODT q4 hours prn nausea/vomit Patient taking differently: Take 4 mg by mouth 2 (two) times daily. 4mg  ODT q4 hours prn nausea/vomit 11/26/17   Milton Ferguson, MD  predniSONE (DELTASONE) 5 MG tablet TAKE 1 TABLET BY MOUTH ONCE DAILY WITH BREAKFAST 04/22/18   Hawks, Christy A, FNP  PROGRAF 0.5 MG capsule Take 0.5-1 mg by mouth See admin instructions. Take 1 mg by mouth in the morning and take 0.5 mg by mouth at bedtime 10/24/16   [provider]  rosuvastatin (CRESTOR) 20 MG tablet Take 1 tablet by mouth once daily 10/28/18   Evelina Dun A, FNP  sevelamer carbonate (RENVELA) 800 MG tablet Take 800 mg by mouth See admin instructions. 4 tablets with meals and 2 tablets with snacks. 10/02/16   [provider]  triamcinolone ointment (KENALOG) 0.5 % Apply 1 application topically 2 (two) times daily. 10/17/18   Sharion Balloon, FNP  TRULICITY 1.5 0000000 SOPN INJECT 1 DOSE (1.5 MG) SUBCUTANEOUSLY ONCE A WEEK 10/02/18   Sharion Balloon, FNP   Current Facility-Administered Medications  Medication Dose Route Frequency Provider Last Rate Last Dose  . 0.9 %  sodium chloride infusion   Intravenous PRN Veryl Speak, MD 10 mL/hr at 12/02/18 0741 500 mL at 12/02/18 0741  . Chlorhexidine Gluconate Cloth 2 % PADS 6 each  6 each Topical Q0600 Donato Heinz, MD      . vancomycin (VANCOCIN) 1,500 mg in  sodium chloride 0.9 % 500 mL IVPB  1,500 mg Intravenous Once Tat, Shanon Brow, MD       Current Outpatient Medications  Medication Sig Dispense Refill  . acetaminophen (TYLENOL) 500 MG tablet Take 500 mg by mouth every 6 (six) hours as needed for moderate pain or headache.     . albuterol (PROVENTIL HFA;VENTOLIN HFA) 108 (90 Base) MCG/ACT inhaler Inhale 1-2 puffs into the lungs every 6 (six) hours as needed for wheezing or shortness of breath. 1 Inhaler 0  . amLODipine (NORVASC) 5 MG tablet Take 1 tablet by mouth nightly 90 tablet 0  . aspirin (ASPIR-LOW) 81 MG EC tablet Take 81 mg by mouth daily.     . B Complex-C-Folic Acid (RENA-VITE RX) 1 MG TABS Take 1 tablet by mouth daily.    . cephALEXin (KEFLEX) 500 MG capsule Take 1 capsule (500 mg total) by mouth 2 (two) times daily. 14 capsule 0  . fluconazole (DIFLUCAN) 150 MG tablet TAKE ONE TABLET BY MOUTH AS A ONE TIME DOSE REPEAT DOSE IN 3 DAYS IF NEEDED    . fluticasone (FLONASE) 50 MCG/ACT nasal spray Place 2 sprays into both nostrils daily. 16 g 6  . insulin degludec (TRESIBA FLEXTOUCH) 100 UNIT/ML SOPN FlexTouch Pen Inject 0.1 mLs (10 Units total) into the skin daily at 10 pm.  12 pen 2  . isosorbide mononitrate (IMDUR) 60 MG 24 hr tablet Take 1 tablet by mouth once daily 90 tablet 0  . lidocaine-prilocaine (EMLA) cream Apply 1 application topically as needed (for dialysis).   0  . meclizine (ANTIVERT) 25 MG tablet Take 1 tablet (25 mg total) by mouth 2 (two) times daily as needed for dizziness. 30 tablet 0  . metoprolol tartrate (LOPRESSOR) 25 MG tablet Take 1/2 (one-half) tablet by mouth twice daily 90 tablet 1  . metroNIDAZOLE (FLAGYL) 500 MG tablet Take 1 tablet (500 mg total) by mouth 2 (two) times daily. 14 tablet 0  . ondansetron (ZOFRAN ODT) 4 MG disintegrating tablet 4mg  ODT q4 hours prn nausea/vomit (Patient taking differently: Take 4 mg by mouth 2 (two) times daily. 4mg  ODT q4 hours prn nausea/vomit) 12 tablet 0  . predniSONE (DELTASONE) 5 MG tablet TAKE 1 TABLET BY MOUTH ONCE DAILY WITH BREAKFAST 90 tablet 0  . PROGRAF 0.5 MG capsule Take 0.5-1 mg by mouth See admin instructions. Take 1 mg by mouth in the morning and take 0.5 mg by mouth at bedtime  0  . rosuvastatin (CRESTOR) 20 MG tablet Take 1 tablet by mouth once daily 90 tablet 1  . sevelamer carbonate (RENVELA) 800 MG tablet Take 800 mg by mouth See admin instructions. 4 tablets with meals and 2 tablets with snacks.    . triamcinolone ointment (KENALOG) 0.5 % Apply 1 application topically 2 (two) times daily. 60 g 0  . TRULICITY 1.5 0000000 SOPN INJECT 1 DOSE (1.5 MG) SUBCUTANEOUSLY ONCE A WEEK 12 mL 1   Labs: Basic Metabolic Panel: Recent Labs  Lab 11/26/18 1520 12/02/18 0716  NA 136 134*  K 4.3 4.6  CL 96* 96*  CO2 28 22  GLUCOSE 360* 214*  BUN 33* 68*  CREATININE 6.47* 9.07*  CALCIUM 8.5* 8.4*   Liver Function Tests: Recent Labs  Lab 11/26/18 1520 12/02/18 0716  AST 39 30  ALT 35 37  ALKPHOS 90 92  BILITOT 0.7 0.7  PROT 7.3 6.7  ALBUMIN 3.4* 3.1*   No results for input(s): LIPASE, AMYLASE in the last 168 hours. No results  for input(s): AMMONIA in the last 168  hours. CBC: Recent Labs  Lab 11/26/18 1520 12/02/18 0716  WBC 10.0 16.9*  NEUTROABS 8.4* 15.0*  HGB 10.2* 10.5*  HCT 33.1* 32.8*  MCV 100.3* 99.4  PLT 184 183   Cardiac Enzymes: No results for input(s): CKTOTAL, CKMB, CKMBINDEX, TROPONINI in the last 168 hours. CBG: No results for input(s): GLUCAP in the last 168 hours. Iron Studies: No results for input(s): IRON, TIBC, TRANSFERRIN, FERRITIN in the last 72 hours. Studies/Results: Dg Chest Port 1 View  Result Date: 12/02/2018 CLINICAL DATA:  Sepsis EXAM: PORTABLE CHEST 1 VIEW COMPARISON:  June 27, 2018 FINDINGS: Heart size is enlarged. There are prominent bilateral interstitial lung markings. There may be small bilateral pleural effusions. There is no pneumothorax. There is no acute osseous abnormality. IMPRESSION: Cardiomegaly with prominent interstitial lung markings may be secondary to pulmonary edema or an atypical infectious process. Electronically Signed   By: Constance Holster M.D.   On: 12/02/2018 07:56    ROS: Pertinent items are noted in HPI. Physical Exam: Vitals:   12/02/18 0700 12/02/18 0730 12/02/18 0830 12/02/18 0900  BP: (!) 166/54 (!) 149/55 (!) 135/51 (!) 145/53  Pulse: 85 84 78 80  Resp: 18 19 19 20   Temp:      TempSrc:      SpO2: 95% 95% 92% 91%  Weight:      Height:          Weight change:   Intake/Output Summary (Last 24 hours) at 12/02/2018 1206 Last data filed at 12/02/2018 1031 Gross per 24 hour  Intake 200 ml  Output -  Net 200 ml   BP (!) 145/53   Pulse 80   Temp (!) 102.6 F (39.2 C) (Oral)   Resp 20   Ht 5\' 9"  (1.753 m)   Wt 65.8 kg   SpO2 91%   BMI 21.41 kg/m  General appearance: fatigued, moderate distress and pale Head: Normocephalic, without obvious abnormality, atraumatic Resp: rales bibasilar Cardio: regular rate and rhythm and no rub GI: soft, non-tender; bowel sounds normal; no masses,  no organomegaly Extremities: extremities normal, atraumatic, no cyanosis or edema  and L AVF +T/B Dialysis Access:  Dialysis Orders: Center:  [facility]  on MWF . EDW 69 kg HD Bath 2K/2.5Ca  Time 3:30 Heparin none. Access LAVF BFR 300 DFR 600    Hectoral 2.5 mcg IV/HD, epo 2400 units tiw  Assessment/Plan: 1.  Sepsis presumably due to lobar pneumonia- on vanco and cefepime per primary svc 2.  ESRD -   Plan for HD today as she is hemodynamically stable 3. Acute hypoxic respiratory failure- on non-rebreather per primary svc  4. Hypertension/volume  - UF as tolerated 5.  Anemia  - will switch to aranesp while she remains an inpatient 6.  Metabolic bone disease -  Cont to follow 7.  Nutrition - renal diet 8. DM- per primary svc 9. HLD- on statin 10. CAD- asymptomatic  Donetta Potts, MD [place] Pager [phone] 12/02/2018, 12:06 PM

## 2018-12-03 DIAGNOSIS — N186 End stage renal disease: Secondary | ICD-10-CM

## 2018-12-03 LAB — RENAL FUNCTION PANEL
Albumin: 2.6 g/dL — ABNORMAL LOW (ref 3.5–5.0)
Anion gap: 15 (ref 5–15)
BUN: 43 mg/dL — ABNORMAL HIGH (ref 8–23)
CO2: 23 mmol/L (ref 22–32)
Calcium: 8.2 mg/dL — ABNORMAL LOW (ref 8.9–10.3)
Chloride: 98 mmol/L (ref 98–111)
Creatinine, Ser: 6.02 mg/dL — ABNORMAL HIGH (ref 0.44–1.00)
GFR calc Af Amer: 8 mL/min — ABNORMAL LOW (ref >60)
GFR calc non Af Amer: 7 mL/min — ABNORMAL LOW (ref >60)
Glucose, Bld: 189 mg/dL — ABNORMAL HIGH (ref 70–99)
Phosphorus: 5 mg/dL — ABNORMAL HIGH (ref 2.5–4.6)
Potassium: 4.7 mmol/L (ref 3.5–5.1)
Sodium: 136 mmol/L (ref 135–145)

## 2018-12-03 LAB — HIV ANTIBODY (ROUTINE TESTING W REFLEX): HIV Screen 4th Generation wRfx: NONREACTIVE

## 2018-12-03 LAB — GLUCOSE, CAPILLARY
Glucose-Capillary: 206 mg/dL — ABNORMAL HIGH (ref 70–99)
Glucose-Capillary: 314 mg/dL — ABNORMAL HIGH (ref 70–99)
Glucose-Capillary: 371 mg/dL — ABNORMAL HIGH (ref 70–99)
Glucose-Capillary: 393 mg/dL — ABNORMAL HIGH (ref 70–99)

## 2018-12-03 LAB — CBC
HCT: 34.2 % — ABNORMAL LOW (ref 36.0–46.0)
Hemoglobin: 10.8 g/dL — ABNORMAL LOW (ref 12.0–15.0)
MCH: 31.8 pg (ref 26.0–34.0)
MCHC: 31.6 g/dL (ref 30.0–36.0)
MCV: 100.6 fL — ABNORMAL HIGH (ref 80.0–100.0)
Platelets: 136 10*3/uL — ABNORMAL LOW (ref 150–400)
RBC: 3.4 MIL/uL — ABNORMAL LOW (ref 3.87–5.11)
RDW: 13.1 % (ref 11.5–15.5)
WBC: 14.9 10*3/uL — ABNORMAL HIGH (ref 4.0–10.5)
nRBC: 0 % (ref 0.0–0.2)

## 2018-12-03 MED ORDER — PREDNISONE 10 MG PO TABS
10.0000 mg | ORAL_TABLET | Freq: Every day | ORAL | Status: AC
  Administered 2018-12-03: 10 mg via ORAL
  Filled 2018-12-03: qty 1

## 2018-12-03 MED ORDER — PREDNISONE 10 MG PO TABS
5.0000 mg | ORAL_TABLET | Freq: Every day | ORAL | Status: DC
  Administered 2018-12-04: 5 mg via ORAL
  Filled 2018-12-03: qty 1

## 2018-12-03 MED ORDER — ZOLPIDEM TARTRATE 5 MG PO TABS
5.0000 mg | ORAL_TABLET | Freq: Every evening | ORAL | Status: DC | PRN
  Administered 2018-12-03: 5 mg via ORAL
  Filled 2018-12-03: qty 1

## 2018-12-03 NOTE — TOC Initial Note (Signed)
Transition of Care [facility]) - Initial/Assessment Note    Patient Details  Name: [name] MRN: [id number] Date of Birth: [birth year]  Transition of Care [facility] CM/SW Contact:    Boneta Lucks, RN Phone Number: 12/03/2018, 3:00 PM  Clinical Narrative:      Patient admitted with sepsis. Patient has a high readmission score. Patient has improved very quickly.  Dialysis patient M/W/F. Patient lives at home with her husband. She is independent at home. Her daughter comes by daily and provides transportation.  She does not have any difficulty get medication. Patient wants to wait for PT evaluation and think about Home Health services. TOC to follow.              Expected Discharge Plan: Home/Self Care Barriers to Discharge: Continued Medical Work up   Patient Goals and CMS Choice Patient states their goals for this hospitalization and ongoing recovery are:: to go home.      Expected Discharge Plan and Services Expected Discharge Plan: Home/Self Care       Prior Living Arrangements/Services   Lives with:: Spouse Patient language and need for interpreter reviewed:: Yes Do you feel safe going back to the place where you live?: Yes      Need for Family Participation in Patient Care: Yes (Comment) Care giver support system in place?: Yes (comment)   Criminal Activity/Legal Involvement Pertinent to Current Situation/Hospitalization: No - Comment as needed  Activities of Daily Living Home Assistive Devices/Equipment: Bedside commode/3-in-1, Shower chair with back, Grab bars in shower, Grab bars around toilet, CBG Meter ADL Screening (condition at time of admission) Patient's cognitive ability adequate to safely complete daily activities?: Yes Is the patient deaf or have difficulty hearing?: No Does the patient have difficulty seeing, even when wearing glasses/contacts?: No Does the patient have difficulty concentrating, remembering, or making decisions?: No Patient able to express  need for assistance with ADLs?: Yes Does the patient have difficulty dressing or bathing?: Yes Independently performs ADLs?: Yes (appropriate for developmental age) Does the patient have difficulty walking or climbing stairs?: Yes Weakness of Legs: None Weakness of Arms/Hands: None    Affect (typically observed): Accepting Orientation: : Oriented to Self, Oriented to Place, Oriented to  Time, Oriented to Situation Alcohol / Substance Use: Not Applicable Psych Involvement: No (comment)  Admission diagnosis:  Vomiting [R11.10] HCAP (healthcare-associated pneumonia) [J18.9] Fever, unspecified fever cause [R50.9] Patient Active Problem List   Diagnosis Date Noted  . Sepsis due to undetermined organism ([place]) 12/02/2018  . Lobar pneumonia ([place]) 12/02/2018  . Acute respiratory failure with hypoxia ([place]) 12/02/2018  . Hydronephrosis   . Abnormal LFTs (liver function tests) 03/11/2018  . Type 2 diabetes mellitus ([place]) 03/11/2018  . Valvular heart disease 03/11/2018  . Chronic kidney disease on chronic dialysis ([place]) 11/13/2017  . Leukocytosis 09/21/2017  . Generalized weakness 09/21/2017  . Hyperlipidemia associated with type 2 diabetes mellitus ([place]) 02/22/2017  . ESRD (end stage renal disease) ([place]) Aug 10, 2016  . 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 Encounter for aftercare following kidney transplant 05/22/2016  . Chronic diastolic heart failure ([place]) 06/08/2015  . Aortic valve sclerosis 06/08/2015  . Anemia of chronic disease 07/03/2013  . Hypertension associated with diabetes ([place]) 12/18/2010   PCP:  Sharion Balloon, FNP Pharmacy:   [facility] [address], [place] [address] [place] [address] Phone: [phone] Fax: [phone]     Social Determinants of Health (SDOH) Interventions    Readmission  Risk Interventions Readmission Risk Prevention Plan 12/03/2018  Transportation Screening Complete  PCP or Specialist  Appt within 3-5 Days Not Complete  HRI or [place] Complete  Social Work Consult for [place] Planning/Counseling Complete  Palliative Care Screening Not Complete  Medication Review Press photographer) Complete  Some recent data might be hidden

## 2018-12-03 NOTE — Plan of Care (Signed)
  Problem: Acute Rehab PT Goals(only PT should resolve) Goal: Patient Will Transfer Sit To/From Stand Outcome: Progressing Flowsheets (Taken 12/03/2018 1633) Patient will transfer sit to/from stand: Independently Goal: Pt Will Transfer Bed To Chair/Chair To Bed Outcome: Progressing Flowsheets (Taken 12/03/2018 1633) Pt will Transfer Bed to Chair/Chair to Bed: Independently Goal: Pt Will Ambulate Outcome: Progressing Flowsheets (Taken 12/03/2018 1633) Pt will Ambulate:  > 125 feet  with modified independence   4:33 PM, 12/03/18 Lonell Grandchild, MPT Physical Therapist with [facility] [phone] office [phone] mobile phone w

## 2018-12-03 NOTE — Progress Notes (Signed)
Patient ID: [name], female   DOB: 20-May-1955, 63 y.o.   MRN: [id number] S: Feels and looks much better this morning.  She tolerated HD well yesterday. O:BP (!) 116/54   Pulse (!) 59   Temp 98.5 F (36.9 C) (Oral)   Resp 15   Ht 5\' 9"  (1.753 m)   Wt 68.9 kg   SpO2 96%   BMI 22.43 kg/m   Intake/Output Summary (Last 24 hours) at 12/03/2018 [id number] Last data filed at 12/02/2018 2330 Gross per 24 hour  Intake 750.71 ml  Output 1702 ml  Net -951.29 ml   Intake/Output: I/O last 3 completed shifts: In: 750.7 [I.V.:50.7; IV Piggyback:700] Out: 1702 [Other:1702]  Intake/Output this shift:  No intake/output data recorded. Weight change: 2.268 kg Gen:NAD CVS: bradycardic, no rub Resp: bibasilar crackles Abd: +BS, soft, NT Ext: no edema, LAVF +T/B  Recent Labs  Lab 11/26/18 1520 12/02/18 0716 12/03/18 0358  NA 136 134* 136  K 4.3 4.6 4.7  CL 96* 96* 98  CO2 28 22 23   GLUCOSE 360* 214* 189*  BUN 33* 68* 43*  CREATININE 6.47* 9.07* 6.02*  ALBUMIN 3.4* 3.1* 2.6*  CALCIUM 8.5* 8.4* 8.2*  PHOS  --   --  5.0*  AST 39 30  --   ALT 35 37  --    Liver Function Tests: Recent Labs  Lab 11/26/18 1520 12/02/18 0716 12/03/18 0358  AST 39 30  --   ALT 35 37  --   ALKPHOS 90 92  --   BILITOT 0.7 0.7  --   PROT 7.3 6.7  --   ALBUMIN 3.4* 3.1* 2.6*   No results for input(s): LIPASE, AMYLASE in the last 168 hours. No results for input(s): AMMONIA in the last 168 hours. CBC: Recent Labs  Lab 11/26/18 1520 12/02/18 0716 12/03/18 0358  WBC 10.0 16.9* 14.9*  NEUTROABS 8.4* 15.0*  --   HGB 10.2* 10.5* 10.8*  HCT 33.1* 32.8* 34.2*  MCV 100.3* 99.4 100.6*  PLT 184 183 136*   Cardiac Enzymes: No results for input(s): CKTOTAL, CKMB, CKMBINDEX, TROPONINI in the last 168 hours. CBG: Recent Labs  Lab 12/02/18 1616 12/02/18 1817 12/02/18 2156 12/03/18 0743  GLUCAP 204* 170* 124* 206*    Iron Studies: No results for input(s): IRON, TIBC, TRANSFERRIN, FERRITIN in the last  72 hours. Studies/Results: Dg Chest Port 1 View  Result Date: 12/02/2018 CLINICAL DATA:  Sepsis EXAM: PORTABLE CHEST 1 VIEW COMPARISON:  June 27, 2018 FINDINGS: Heart size is enlarged. There are prominent bilateral interstitial lung markings. There may be small bilateral pleural effusions. There is no pneumothorax. There is no acute osseous abnormality. IMPRESSION: Cardiomegaly with prominent interstitial lung markings may be secondary to pulmonary edema or an atypical infectious process. Electronically Signed   By: Constance Holster M.D.   On: 12/02/2018 07:56   . aspirin EC  81 mg Oral Daily  . Chlorhexidine Gluconate Cloth  6 each Topical Q0600  . heparin  5,000 Units Subcutaneous Q8H  . insulin aspart  0-5 Units Subcutaneous QHS  . insulin aspart  0-9 Units Subcutaneous TID WC  . insulin glargine  5 Units Subcutaneous Daily  . metoprolol tartrate  12.5 mg Oral BID  . [START ON 12/04/2018] predniSONE  5 mg Oral Q breakfast  . rosuvastatin  20 mg Oral Daily  . sevelamer carbonate  1,600 mg Oral With snacks  . sevelamer carbonate  3,200 mg Oral TID WC  . tacrolimus  0.5 mg  Oral QHS  . tacrolimus  1 mg Oral q morning - 10a    BMET    Component Value Date/Time   NA 136 12/03/2018 0358   NA 145 (H) 11/05/2018 1249   K 4.7 12/03/2018 0358   CL 98 12/03/2018 0358   CO2 23 12/03/2018 0358   GLUCOSE 189 (H) 12/03/2018 0358   BUN 43 (H) 12/03/2018 0358   BUN 27 11/05/2018 1249   CREATININE 6.02 (H) 12/03/2018 0358   CALCIUM 8.2 (L) 12/03/2018 0358   GFRNONAA 7 (L) 12/03/2018 0358   GFRAA 8 (L) 12/03/2018 0358   CBC    Component Value Date/Time   WBC 14.9 (H) 12/03/2018 0358   RBC 3.40 (L) 12/03/2018 0358   HGB 10.8 (L) 12/03/2018 0358   HGB 11.8 10/01/2018 1148   HCT 34.2 (L) 12/03/2018 0358   HCT 36.6 10/01/2018 1148   PLT 136 (L) 12/03/2018 0358   PLT 165 10/01/2018 1148   MCV 100.6 (H) 12/03/2018 0358   MCV 98 (H) 10/01/2018 1148   MCH 31.8 12/03/2018 0358   MCHC 31.6  12/03/2018 0358   RDW 13.1 12/03/2018 0358   RDW 12.8 10/01/2018 1148   LYMPHSABS 0.7 12/02/2018 0716   LYMPHSABS 0.7 10/01/2018 1148   MONOABS 0.9 12/02/2018 0716   EOSABS 0.2 12/02/2018 0716   EOSABS 0.3 10/01/2018 1148   BASOSABS 0.1 12/02/2018 0716   BASOSABS 0.1 10/01/2018 1148      Dialysis Orders: Center:  [facility]  on MWF . EDW 69 kg HD Bath 2K/2.5Ca  Time 3:30 Heparin none. Access LAVF BFR 300 DFR 600    Hectoral 2.5 mcg IV/HD, epo 2400 units tiw  Assessment/Plan: 1.  Sepsis presumably due to lobar pneumonia- on cefepime per primary svc with improvement clinically and by labs.  2.  ESRD -   Plan to keep on MWF schedule while she remains an inpatient. 3. Acute hypoxic respiratory failure- now off of non-rebreather and on nasal canula per primary svc  4. Hypertension/volume  - UF as tolerated 5.  Anemia  - will switch to aranesp while she remains an inpatient 6.  Metabolic bone disease -  Cont to follow 7.  Nutrition - renal diet 8. DM- per primary svc 9. HLD- on statin 10. CAD- asymptomatic  Donetta Potts, MD [employer] [phone]

## 2018-12-03 NOTE — Progress Notes (Addendum)
PROGRESS NOTE  [name] [id number] DOB: 1955/05/19 DOA: 12/02/2018 PCP: Sharion Balloon, FNP  Brief History:  63 y.o. 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  The patient recently visited the emergency department on 11/26/2018 with generalized weakness which was attributable to UTI.  The patient was discharged home with cephalexin and metronidazole.  Unfortunately, the patient continued to have generalized weakness and now presented with the above symptoms.  She had denied any hematemesis, hematochezia, melena, abdominal pain, dysuria, hematuria.  She denies any recent sick contacts.  The patient had dialysis for 4 hours on Friday.  She has been compliant with her medications.  She complains of a nonproductive cough but denies any hemoptysis. In the emergency department, the patient was febrile with a temperature of 102.6 F but was hemodynamically stable with oxygen saturation in the upper 80s on room air.  The patient was subsequently titrated up to a nonrebreather with oxygen saturation 100%.  WBC was 16.9.  Serum creatinine was 9.07 with potassium 4.6 and bicarb 22.  Chest x-ray showed increased interstitial markings and increased right basilar opacity.  COVID was negative.  The patient was given vancomycin, cefepime, metronidazole in the emergency department.   Assessment/Plan:  Sepsis -Present at time of admission -Secondary to pneumonia, but cannot rule out bacteremia -Urine culture from 11/26/2018 showed less than 10,000 colonies of growth -Personally reviewed chest x-ray--increased interstitial markings, increased right basilar opacity -Lactic acid 1.6>>>0.8 -Continue cefepime -d/c vanco -follow  blood culture -urine culture of dubious clinical significance in ESRD patient, collected after abx  Lobar pneumonia -Check procalcitonin--6.83 -Continue cefepime  Acute respiratory failure with hypoxia -Secondary to pneumonia and pulmonary edema -weaned NRB>>>1L -Wean oxygen for saturation greater 92%  ESRD -Patient has failed renal transplant -Consult nephrology for maintenance dialysis  Failed renal transplant -Continue Prograf -Give stress dose steroids as she is on prednisone 5 mg daily -wean back to 5 mg daily  Diabetes mellitus type 2, uncontrolled with hyperglycemia -12/02/2018 hemoglobin A1c 9.4 -NovoLog sliding scale -Continue reduced dose Lantus -Holding Trulicity  Essential hypertension -Holding amlodipine in the setting of sepsis -Continue metoprolol tartrate  Hyperlipidemia -Continue statin  Coronary artery disease -No chest pain presently -Continue Imdur -Personally reviewed EKG--sinus rhythm, no ST ST or changes  Diarrhea -no BM since admission       Disposition Plan:   Home 9/30 or 10/1 if stable Family Communication:   Family at bedside  Consultants:  renal  Code Status:  FULL DVT Prophylaxis:  [place] Heparin    Procedures: As Listed in Progress Note Above  Antibiotics: Cefepime 9/27>>> vanco 9/27       Subjective: Pt is feeling a little stronger today, but remains weak.  Denies f/c, cp, sob, n/v/d, abd pain.  apetite is poor.  Denies HA, visual disturbance, focal ext weakness  Objective: Vitals:   12/02/18 2312 12/02/18 2345 12/03/18 0000 12/03/18 0100  BP:  (!) 118/52 (!) 102/44 (!) 107/52  Pulse: 71 77 69 68  Resp: 16  15 13   Temp:      TempSrc:      SpO2: 95%  93% 96%  Weight:      Height:  Intake/Output Summary (Last 24 hours) at 12/03/2018 [id number] Last data filed at 12/02/2018 2330 Gross per 24 hour  Intake 750.71 ml  Output 1702 ml  Net -951.29 ml   Weight change: 2.268 kg Exam:   General:  Pt  is alert, follows commands appropriately, not in acute distress  HEENT: No icterus, No thrush, No neck mass, [place]/AT  Cardiovascular: RRR, S1/S2, no rubs, no gallops  Respiratory: bibasilar rales, R>L, no wheeze  Abdomen: Soft/+BS, non tender, non distended, no guarding  Extremities: No edema, No lymphangitis, No petechiae, No rashes, no synovitis   Data Reviewed: I have personally reviewed following labs and imaging studies Basic Metabolic Panel: Recent Labs  Lab 11/26/18 1520 12/02/18 0716  NA 136 134*  K 4.3 4.6  CL 96* 96*  CO2 28 22  GLUCOSE 360* 214*  BUN 33* 68*  CREATININE 6.47* 9.07*  CALCIUM 8.5* 8.4*   Liver Function Tests: Recent Labs  Lab 11/26/18 1520 12/02/18 0716  AST 39 30  ALT 35 37  ALKPHOS 90 92  BILITOT 0.7 0.7  PROT 7.3 6.7  ALBUMIN 3.4* 3.1*   No results for input(s): LIPASE, AMYLASE in the last 168 hours. No results for input(s): AMMONIA in the last 168 hours. Coagulation Profile: Recent Labs  Lab 12/02/18 0716  INR 1.3*   CBC: Recent Labs  Lab 11/26/18 1520 12/02/18 0716 12/03/18 0358  WBC 10.0 16.9* 14.9*  NEUTROABS 8.4* 15.0*  --   HGB 10.2* 10.5* 10.8*  HCT 33.1* 32.8* 34.2*  MCV 100.3* 99.4 100.6*  PLT 184 183 136*   Cardiac Enzymes: No results for input(s): CKTOTAL, CKMB, CKMBINDEX, TROPONINI in the last 168 hours. BNP: Invalid input(s): POCBNP CBG: Recent Labs  Lab 12/02/18 1616 12/02/18 1817 12/02/18 2156  GLUCAP 204* 170* 124*   HbA1C: Recent Labs    12/02/18 1502  HGBA1C 9.4*   Urine analysis:    Component Value Date/Time   COLORURINE YELLOW 12/02/2018 0622   APPEARANCEUR HAZY (A) 12/02/2018 0622   APPEARANCEUR Cloudy (A) 11/25/2018 1202   LABSPEC 1.013 12/02/2018 0622   PHURINE 7.0 12/02/2018 0622   GLUCOSEU 150 (A) 12/02/2018 0622   HGBUR SMALL (A) 12/02/2018 0622   BILIRUBINUR NEGATIVE 12/02/2018 0622   BILIRUBINUR Negative 11/25/2018 1202   KETONESUR NEGATIVE 12/02/2018 0622   PROTEINUR  100 (A) 12/02/2018 0622   NITRITE NEGATIVE 12/02/2018 0622   LEUKOCYTESUR LARGE (A) 12/02/2018 0622   Sepsis Labs: @LABRCNTIP (procalcitonin:4,lacticidven:4) ) Recent Results (from the past 240 hour(s))  WET PREP FOR TRICH, YEAST, CLUE     Status: Abnormal   Collection Time: 11/25/18 12:02 PM   Specimen: Vaginal Fluid   VAGINAL FLUI  Result Value Ref Range Status   Trichomonas Exam Negative Negative Final   Yeast Exam Negative Negative Final   Clue Cell Exam Positive (A) Negative Final    Comment: wbc  >25 bacteria  many epi's  moderate   Urine Culture     Status: None   Collection Time: 11/25/18 12:02 PM   Specimen: Urine   UR  Result Value Ref Range Status   Urine Culture, Routine Final report  Final   Organism ID, Bacteria Comment  Final    Comment: Mixed urogenital flora Less than 10,000 colonies/mL   Microscopic Examination     Status: Abnormal   Collection Time: 11/25/18 12:02 PM   URINE  Result Value Ref Range Status   WBC, UA >30 (A) 0 - 5 /hpf Final   RBC 3-10 (A) 0 -  2 /hpf Final   Epithelial Cells (non renal) 0-10 0 - 10 /hpf Final   Renal Epithel, UA 0-10 (A) None seen /hpf Final   Bacteria, UA Moderate (A) None seen/Few Final  Urine Culture     Status: Abnormal   Collection Time: 11/26/18  5:00 PM   Specimen: Urine, Random  Result Value Ref Range Status   Specimen Description   Final    URINE, RANDOM Performed at [facility], [address] [place], [place] [address]    Special Requests   Final    Normal Performed at [facility], [address], [place], [place] [address]    Culture (A)  Final    <10,000 COLONIES/mL INSIGNIFICANT GROWTH Performed at [facility], [place] [address], [place], [place] [address]    Report Status 11/28/2018 FINAL  Final  SARS Coronavirus 2 [facility] order, Performed in [facility] hospital lab) Nasopharyngeal Nasopharyngeal Swab     Status: None   Collection Time: 12/02/18  6:26 AM   Specimen: Nasopharyngeal Swab   Result Value Ref Range Status   SARS Coronavirus 2 NEGATIVE NEGATIVE Final    Comment: (NOTE) If result is NEGATIVE SARS-CoV-2 target nucleic acids are NOT DETECTED. The SARS-CoV-2 RNA is generally detectable in upper and lower  respiratory specimens during the acute phase of infection. The lowest  concentration of SARS-CoV-2 viral copies this assay can detect is 250  copies / mL. A negative result does not preclude SARS-CoV-2 infection  and should not be used as the sole basis for treatment or other  patient management decisions.  A negative result may occur with  improper specimen collection / handling, submission of specimen other  than nasopharyngeal swab, presence of viral mutation(s) within the  areas targeted by this assay, and inadequate number of viral copies  (<250 copies / mL). A negative result must be combined with clinical  observations, patient history, and epidemiological information. If result is POSITIVE SARS-CoV-2 target nucleic acids are DETECTED. The SARS-CoV-2 RNA is generally detectable in upper and lower  respiratory specimens dur ing the acute phase of infection.  Positive  results are indicative of active infection with SARS-CoV-2.  Clinical  correlation with patient history and other diagnostic information is  necessary to determine patient infection status.  Positive results do  not rule out bacterial infection or co-infection with other viruses. If result is PRESUMPTIVE POSTIVE SARS-CoV-2 nucleic acids MAY BE PRESENT.   A presumptive positive result was obtained on the submitted specimen  and confirmed on repeat testing.  While 2019 novel coronavirus  (SARS-CoV-2) nucleic acids may be present in the submitted sample  additional confirmatory testing may be necessary for epidemiological  and / or clinical management purposes  to differentiate between  SARS-CoV-2 and other Sarbecovirus currently known to infect humans.  If clinically indicated additional  testing with an alternate test  methodology [phone]) is advised. The SARS-CoV-2 RNA is generally  detectable in upper and lower respiratory sp ecimens during the acute  phase of infection. The expected result is Negative. Fact Sheet for Patients:  StrictlyIdeas.no Fact Sheet for Healthcare Providers: BankingDealers.co.za This test is not yet approved or cleared by the [place] FDA and has been authorized for detection and/or diagnosis of SARS-CoV-2 by FDA under an Emergency Use Authorization (EUA).  This EUA will remain in effect (meaning this test can be used) for the duration of the COVID-19 declaration under Section 564(b)(1) of the Act, 21 U.S.C. section 360bbb-3(b)(1), unless the authorization  is terminated or revoked sooner. Performed at [facility], [address] [place], [place] [address]   Culture, blood (Routine x 2)     Status: None (Preliminary result)   Collection Time: 12/02/18  6:52 AM   Specimen: Vein; Blood  Result Value Ref Range Status   Specimen Description BLOOD RIGHT ARM  Final   Special Requests   Final    BOTTLES DRAWN AEROBIC AND ANAEROBIC Blood Culture results may not be optimal due to an inadequate volume of blood received in culture bottles Performed at [facility], [address], [place], [place] [address]    Culture PENDING  Incomplete   Report Status PENDING  Incomplete  Culture, blood (Routine x 2)     Status: None (Preliminary result)   Collection Time: 12/02/18  7:17 AM   Specimen: Vein; Blood  Result Value Ref Range Status   Specimen Description BLOOD RIGHT HAND  Final   Special Requests   Final    BOTTLES DRAWN AEROBIC AND ANAEROBIC Blood Culture results may not be optimal due to an inadequate volume of blood received in culture bottles Performed at [facility], [address], [place], [place] [address]    Culture PENDING  Incomplete   Report Status PENDING  Incomplete  MRSA PCR  Screening     Status: None   Collection Time: 12/02/18  6:11 PM   Specimen: Nasal Mucosa; Nasopharyngeal  Result Value Ref Range Status   MRSA by PCR NEGATIVE NEGATIVE Final    Comment:        The GeneXpert MRSA Assay (FDA approved for NASAL specimens only), is one component of a comprehensive MRSA colonization surveillance program. It is not intended to diagnose MRSA infection nor to guide or monitor treatment for MRSA infections. Performed at [facility], [address], [place],  [address]      Scheduled Meds: . aspirin EC  81 mg Oral Daily  . Chlorhexidine Gluconate Cloth  6 each Topical Q0600  . heparin  5,000 Units Subcutaneous Q8H  . insulin aspart  0-5 Units Subcutaneous QHS  . insulin aspart  0-9 Units Subcutaneous TID WC  . insulin glargine  5 Units Subcutaneous Daily  . metoprolol tartrate  12.5 mg Oral BID  . predniSONE  20 mg Oral Q breakfast  . rosuvastatin  20 mg Oral Daily  . sevelamer carbonate  1,600 mg Oral With snacks  . sevelamer carbonate  3,200 mg Oral TID WC  . tacrolimus  0.5 mg Oral QHS  . tacrolimus  1 mg Oral q morning - 10a   Continuous Infusions: . sodium chloride 250 mL (12/02/18 2303)  . sodium chloride    . sodium chloride    . ceFEPime (MAXIPIME) IV 1 g (12/02/18 2307)  . vancomycin 1,000 mg (12/02/18 2125)    Procedures/Studies: Ct Head Wo Contrast  Result Date: 11/26/2018 CLINICAL DATA:  Vertigo with 1 week history of dizziness and nausea. Renal failure. EXAM: CT HEAD WITHOUT CONTRAST TECHNIQUE: Contiguous axial images were obtained from the base of the skull through the vertex without intravenous contrast. COMPARISON:  November 26, 2017 FINDINGS: Brain: There is slight age related volume loss. There is no intracranial mass, hemorrhage, extra-axial fluid collection, or midline shift. Brain parenchyma appears unremarkable. No evident acute infarct. Vascular: There is no hyperdense vessel. There is calcification in each distal  vertebral artery and carotid siphon region. Skull: Bony calvarium appears intact. Sinuses/Orbits: Visualized paranasal sinuses are clear. Visualized orbits appear symmetric bilaterally. Other: There  is chronic opacification of an inferior mastoid air cell on the left. Mastoids elsewhere clear. IMPRESSION: Age related volume loss. Brain parenchyma appears unremarkable. No mass or hemorrhage. Extensive arterial vascular calcification noted. Opacification in inferior mastoid air cell on the left, chronic. Electronically Signed   By: Lowella Grip III M.D.   On: 11/26/2018 20:13   Dg Chest Port 1 View  Result Date: 12/02/2018 CLINICAL DATA:  Sepsis EXAM: PORTABLE CHEST 1 VIEW COMPARISON:  June 27, 2018 FINDINGS: Heart size is enlarged. There are prominent bilateral interstitial lung markings. There may be small bilateral pleural effusions. There is no pneumothorax. There is no acute osseous abnormality. IMPRESSION: Cardiomegaly with prominent interstitial lung markings may be secondary to pulmonary edema or an atypical infectious process. Electronically Signed   By: Constance Holster M.D.   On: 12/02/2018 07:56    Orson Eva, DO  [facility] Pager [phone]  If 7PM-7AM, please contact night-coverage [email or web address] Password [email or web address] 12/03/2018, 6:38 AM   LOS: 1 day

## 2018-12-03 NOTE — Evaluation (Signed)
Physical Therapy Evaluation Patient Details Name: [name] MRN: [id number] DOB: [birth year] Today's Date: 12/03/2018   History of Present Illness  [name] is a 63 y.o. 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  The patient recently visited the emergency department on 11/26/2018 with generalized weakness which was attributable to UTI.  The patient was discharged home with cephalexin and metronidazole.  Unfortunately, the patient continued to have generalized weakness and now presented with the above symptoms.  She had denied any hematemesis, hematochezia, melena, abdominal pain, dysuria, hematuria.  She denies any recent sick contacts.  The patient had dialysis for 4 hours on Friday.  She has been compliant with her medications.  She complains of a nonproductive cough but denies any hemoptysis.    Clinical Impression  Patient functioning near baseline for functional mobility and gait, slightly labored cadence with occasional staggering left/right without loss of balance, required brief standing rest break before making it back to room due to fatigue and tolerated sitting up in chair after therapy - RN notified.  Patient will benefit from continued physical therapy in hospital and recommended venue below to increase strength, balance, endurance for safe ADLs and gait.     Follow Up Recommendations Home health PT;Supervision - Intermittent    Equipment Recommendations  None recommended by PT    Recommendations for Other Services       Precautions / Restrictions Precautions Precautions: Fall Restrictions Weight Bearing Restrictions: No      Mobility  Bed Mobility Overal bed mobility: Independent                 Transfers Overall transfer level: Modified independent               General transfer comment: increased time  Ambulation/Gait Ambulation/Gait assistance: Supervision;Min guard Gait Distance (Feet): 100 Feet Assistive device: None Gait Pattern/deviations: Decreased step length - right;Decreased step length - left;Decreased stride length;Staggering right;Staggering left Gait velocity: decreased   General Gait Details: slightly labored cadence with occasional staggering left/right without loss of balance, limited secondary to c/o fatigue  Stairs            Wheelchair Mobility    Modified Rankin (Stroke Patients Only)       Balance Overall balance assessment: Mild deficits observed, not formally tested                                           Pertinent Vitals/Pain Pain Assessment: No/denies pain    Home Living Family/patient expects to be discharged to:: Private residence Living Arrangements: Spouse/significant other Available Help at Discharge: Family;Available 24 hours/day Type of Home: House Home Access: Stairs to enter Entrance Stairs-Rails: None Entrance Stairs-Number of Steps: 2 Home Layout: Two level Home Equipment: Walker - 2 wheels;Cane - single point;Wheelchair - manual;Shower seat;Bedside commode      Prior Function Level of Independence: Independent         Comments: Hydrographic surveyor, drives     Journalist, newspaper        Extremity/Trunk Assessment   Upper Extremity Assessment Upper Extremity Assessment: Overall WFL for tasks assessed    Lower Extremity Assessment  Lower Extremity Assessment: Generalized weakness    Cervical / Trunk Assessment Cervical / Trunk Assessment: Normal  Communication   Communication: No difficulties  Cognition Arousal/Alertness: Awake/alert Behavior During Therapy: WFL for tasks assessed/performed Overall Cognitive Status: Within Functional Limits for tasks assessed                                         General Comments      Exercises     Assessment/Plan    PT Assessment Patient needs continued PT services  PT Problem List Decreased strength;Decreased activity tolerance;Decreased balance;Decreased mobility       PT Treatment Interventions Balance training;Gait training;Stair training;Functional mobility training;Therapeutic activities;Therapeutic exercise;Patient/family education    PT Goals (Current goals can be found in the Care Plan section)  Acute Rehab PT Goals Patient Stated Goal: return home PT Goal Formulation: With patient Time For Goal Achievement: 12/05/18 Potential to Achieve Goals: Good    Frequency Min 2X/week   Barriers to discharge        Co-evaluation               AM-PAC PT "6 Clicks" Mobility  Outcome Measure Help needed turning from your back to your side while in a flat bed without using bedrails?: None Help needed moving from lying on your back to sitting on the side of a flat bed without using bedrails?: None Help needed moving to and from a bed to a chair (including a wheelchair)?: None Help needed standing up from a chair using your arms (e.g., wheelchair or bedside chair)?: None Help needed to walk in hospital room?: A Little Help needed climbing 3-5 steps with a railing? : A Little 6 Click Score: 22    End of Session   Activity Tolerance: Patient tolerated treatment well;Patient limited by fatigue Patient left: in chair;with call bell/phone within reach Nurse Communication: Mobility status PT Visit Diagnosis: Other abnormalities of gait and mobility (R26.89);Unsteadiness on feet (R26.81);Muscle weakness (generalized) (M62.81)    Time: [id number] PT Time Calculation (min) (ACUTE ONLY): 29 min   Charges:   PT Evaluation $PT Eval Moderate Complexity: 1 Mod PT Treatments $Therapeutic Activity: 23-37 mins        4:31 PM, 12/03/18 Lonell Grandchild, MPT Physical Therapist with  [place] [address] [phone] office [phone] mobile phone

## 2018-12-03 NOTE — Discharge Instructions (Signed)
Your diabetes level (Hemaglobin A1c) is 9.4%. This is high. The goal value is 7% or less (which is 150 glucose average when you check your sugar by sticking your finger). Make sure you take your medications every day. If your glucose is above 180 all the time please call your doctor for adjustments. Make sure you are watching the portion sizes of your meals and you are drinking diet/zero/light versions of beverages with your meals.

## 2018-12-03 NOTE — Progress Notes (Signed)
Pt requesting something for sleep- K. Schorr, NP paged to make aware. Waiting for orders/call back. Will continue to monitor pt

## 2018-12-04 DIAGNOSIS — R531 Weakness: Secondary | ICD-10-CM

## 2018-12-04 DIAGNOSIS — Z992 Dependence on renal dialysis: Secondary | ICD-10-CM | POA: Diagnosis not present

## 2018-12-04 DIAGNOSIS — N186 End stage renal disease: Secondary | ICD-10-CM | POA: Diagnosis not present

## 2018-12-04 DIAGNOSIS — J189 Pneumonia, unspecified organism: Secondary | ICD-10-CM

## 2018-12-04 DIAGNOSIS — I10 Essential (primary) hypertension: Secondary | ICD-10-CM

## 2018-12-04 LAB — CBC
HCT: 31.1 % — ABNORMAL LOW (ref 36.0–46.0)
Hemoglobin: 9.8 g/dL — ABNORMAL LOW (ref 12.0–15.0)
MCH: 31.4 pg (ref 26.0–34.0)
MCHC: 31.5 g/dL (ref 30.0–36.0)
MCV: 99.7 fL (ref 80.0–100.0)
Platelets: 155 10*3/uL (ref 150–400)
RBC: 3.12 MIL/uL — ABNORMAL LOW (ref 3.87–5.11)
RDW: 13.5 % (ref 11.5–15.5)
WBC: 10.2 10*3/uL (ref 4.0–10.5)
nRBC: 0 % (ref 0.0–0.2)

## 2018-12-04 LAB — GLUCOSE, CAPILLARY
Glucose-Capillary: 282 mg/dL — ABNORMAL HIGH (ref 70–99)
Glucose-Capillary: 109 mg/dL — ABNORMAL HIGH (ref 70–99)

## 2018-12-04 LAB — URINE CULTURE

## 2018-12-04 MED ORDER — AMOXICILLIN-POT CLAVULANATE 500-125 MG PO TABS: 1.0000 | ORAL_TABLET | Freq: Two times a day (BID) | ORAL | 0 refills | Status: AC

## 2018-12-04 NOTE — Discharge Summary (Signed)
Physician Discharge Summary  [name] [id number] DOB: [birth year] DOA: 12/02/2018  PCP: Sharion Balloon, FNP  Admit date: 12/02/2018 Discharge date: 12/04/2018  Time spent: 35 minutes  Recommendations for Outpatient Follow-up:  1. Repeat chest x-ray in 4-6 weeks to assess complete resolution of lung infiltrates. 2. Reassess blood pressure and further adjust antihypertensive regimen as needed.   Discharge Diagnoses:  Active Problems:   Essential hypertension   ESRD (end stage renal disease) ([place])   Sepsis due to undetermined organism ([place])   Lobar pneumonia ([place])   Acute respiratory failure with hypoxia ([place])   HCAP (healthcare-associated pneumonia)   Discharge Condition: Stable and improved.  Patient discharged home with instruction to follow-up with PCP in 10 days.  Diet recommendation: Heart healthy diet and modify carbohydrates.  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 The patient recently visited the emergency department on 11/26/2018 with generalized weakness which was attributable to UTI. The patient was discharged home with cephalexin and metronidazole. Unfortunately, the patient continued to have generalized weakness and now presented with the above symptoms. She had denied any hematemesis, hematochezia, melena, abdominal pain, dysuria, hematuria. She denies any recent sick contacts. The patient had dialysis for 4 hours on Friday. She has been compliant with her medications. She complains of a nonproductive cough but denies any hemoptysis. In the emergency  department, the patient was febrile with a temperature of 102.6 F but was hemodynamically stable with oxygen saturation in the upper 80s on room air. The patient was subsequently titrated up to a nonrebreather with oxygen saturation 100%. WBC was 16.9. Serum creatinine was 9.07 with potassium 4.6 and bicarb 22. Chest x-ray showed increased interstitial markings and increased right basilar opacity. COVID was negative. The patient was given vancomycin, cefepime, metronidazole in the emergency department.  Hospital Course:  1-sepsis secondary to pneumonia -X-ray demonstrating right lower opacity/infiltrates. -Lactic acid 1.6 down to 0.8 at discharge -Patient with negative MRSA PCR, initially was placed on vancomycin cefepime and Flagyl. -Blood culture negative -Will be discharged on Augmentin orally to complete antibiotic therapy.  2-acute resp failure with hypoxia -due to PNA and pulmonary congestion. -initially requiring NRB mask; now with good O2 sat on RA -as mentioned above, complete oral antibiotic therapy at discharge.  3-ESRD -advise to follow low sodium diet and to check weight on daily basis  -continue outpatient HD therapy MWF  4-secondary hyperparathyroidism -continue renvela  5-type 2 diabetes -A1c 9.4 -resume home hypoglycemic regimen -encourage to follow low carb diet  6-Essential HTN -BP stable at discharge -Continue current antihypertensive regimen -low sodium diet instructed -Follow vital signs and further adjust antihypertensive regimen as needed.  7-hyperlipidemia -Continue statins.  8-failed renal transplant -Continue Prograf and a steroids -once again ESRD.   Procedures:  See below for x-ray reports  Consultations:  Renal service.  Discharge Exam: Vitals:   12/04/18 1030 12/04/18 1100  BP: 131/60 (!) 138/59  Pulse: 60 (!) 54  Resp:    Temp:    SpO2:      General: Afebrile, no chest pain, no nausea, no vomiting.  Reports feeling weak  but ready to  go home. Cardiovascular: RRR, S1 and S2, no rubs, no gallops. Respiratory: Positive rhonchi bilaterally, no crackles, no wheezing, no using accessory muscles.  Good O2 sat on room air Abdomen: Soft, nontender, nondistended, positive bowel sounds Extremities: No edema, no cyanosis or clubbing.  Left AVF with positive thrills.  Discharge Instructions   Discharge Instructions    Ambulatory referral to Physical Therapy   Complete by: As directed    Diet - low sodium heart healthy   Complete by: As directed    Discharge instructions   Complete by: As directed    Take medications as prescribed Arrange follow-up with PCP in 10 days Maintain adequate hydration Complete antibiotics as instructed. Hemodialysis treatment again on Friday, 12/06/2018 Follow heart healthy diet and check your weight on daily basis..     Allergies as of 12/04/2018      Reactions   Other Other (See Comments)   Tape Bruises and tears skin, Paper tape tolerated   Tape Other (See Comments)   Bruises and tears skin Paper tape tolerated Bruises and tears skin Paper tape tolerated Bruises and tears skin Paper tape tolerated      Medication List    STOP taking these medications   cephALEXin 500 MG capsule Commonly known as: KEFLEX   fluconazole 150 MG tablet Commonly known as: DIFLUCAN   metroNIDAZOLE 500 MG tablet Commonly known as: FLAGYL     TAKE these medications   acetaminophen 500 MG tablet Commonly known as: TYLENOL Take 500 mg by mouth every 6 (six) hours as needed for moderate pain or headache.   albuterol 108 (90 Base) MCG/ACT inhaler Commonly known as: VENTOLIN HFA Inhale 1-2 puffs into the lungs every 6 (six) hours as needed for wheezing or shortness of breath.   amLODipine 5 MG tablet Commonly known as: NORVASC Take 1 tablet by mouth nightly   amoxicillin-clavulanate 500-125 MG tablet Commonly known as: Augmentin Take 1 tablet (500 mg total) by mouth 2 (two) times  daily for 5 days.   Aspir-Low 81 MG EC tablet Generic drug: aspirin Take 81 mg by mouth daily.   fluticasone 50 MCG/ACT nasal spray Commonly known as: FLONASE Place 2 sprays into both nostrils daily.   isosorbide mononitrate 60 MG 24 hr tablet Commonly known as: IMDUR Take 1 tablet by mouth once daily   lidocaine-prilocaine cream Commonly known as: EMLA Apply 1 application topically as needed (for dialysis).   meclizine 25 MG tablet Commonly known as: ANTIVERT Take 1 tablet (25 mg total) by mouth 2 (two) times daily as needed for dizziness.   metoprolol tartrate 25 MG tablet Commonly known as: LOPRESSOR Take 1/2 (one-half) tablet by mouth twice daily What changed: See the new instructions.   ondansetron 4 MG disintegrating tablet Commonly known as: Zofran ODT 4mg  ODT q4 hours prn nausea/vomit What changed:   how much to take  how to take this  when to take this   predniSONE 5 MG tablet Commonly known as: DELTASONE TAKE 1 TABLET BY MOUTH ONCE DAILY WITH BREAKFAST   Prograf 0.5 MG capsule Generic drug: tacrolimus Take 0.5-1 mg by mouth See admin instructions. Take 1 mg by mouth in the morning and take 0.5 mg by mouth at bedtime   Rena-Vite Rx 1 MG Tabs Take 1 tablet by mouth daily.   rosuvastatin 20 MG tablet Commonly known as: CRESTOR Take 1 tablet by mouth once daily   sevelamer carbonate 800 MG tablet Commonly known as: RENVELA Take 800 mg by mouth See  admin instructions. 4 tablets with meals and 2 tablets with snacks.   [name] 100 UNIT/ML Sopn FlexTouch Pen Generic drug: insulin degludec Inject 0.1 mLs (10 Units total) into the skin daily at 10 pm.   triamcinolone ointment 0.5 % Commonly known as: KENALOG Apply 1 application topically 2 (two) times daily.   Trulicity 1.5 0000000 Sopn Generic drug: Dulaglutide INJECT 1 DOSE (1.5 MG) SUBCUTANEOUSLY ONCE A WEEK What changed: See the new instructions.      Allergies  Allergen  Reactions  . Other Other (See Comments)    Tape Bruises and tears skin, Paper tape tolerated  . Tape Other (See Comments)    Bruises and tears skin Paper tape tolerated Bruises and tears skin Paper tape tolerated Bruises and tears skin Paper tape tolerated   Follow-up Information    Sharion Balloon, FNP. Schedule an appointment as soon as possible for a visit in 10 day(s).   Specialty: Family Medicine Contact information: [place] [address] [phone]           The results of significant diagnostics from this hospitalization (including imaging, microbiology, ancillary and laboratory) are listed below for reference.    Significant Diagnostic Studies: Ct Head Wo Contrast  Result Date: 11/26/2018 CLINICAL DATA:  Vertigo with 1 week history of dizziness and nausea. Renal failure. EXAM: CT HEAD WITHOUT CONTRAST TECHNIQUE: Contiguous axial images were obtained from the base of the skull through the vertex without intravenous contrast. COMPARISON:  November 26, 2017 FINDINGS: Brain: There is slight age related volume loss. There is no intracranial mass, hemorrhage, extra-axial fluid collection, or midline shift. Brain parenchyma appears unremarkable. No evident acute infarct. Vascular: There is no hyperdense vessel. There is calcification in each distal vertebral artery and carotid siphon region. Skull: Bony calvarium appears intact. Sinuses/Orbits: Visualized paranasal sinuses are clear. Visualized orbits appear symmetric bilaterally. Other: There is chronic opacification of an inferior mastoid air cell on the left. Mastoids elsewhere clear. IMPRESSION: Age related volume loss. Brain parenchyma appears unremarkable. No mass or hemorrhage. Extensive arterial vascular calcification noted. Opacification in inferior mastoid air cell on the left, chronic. Electronically Signed   By: Lowella Grip III M.D.   On: 11/26/2018 20:13   Dg Chest Port 1 View  Result Date:  12/02/2018 CLINICAL DATA:  Sepsis EXAM: PORTABLE CHEST 1 VIEW COMPARISON:  June 27, 2018 FINDINGS: Heart size is enlarged. There are prominent bilateral interstitial lung markings. There may be small bilateral pleural effusions. There is no pneumothorax. There is no acute osseous abnormality. IMPRESSION: Cardiomegaly with prominent interstitial lung markings may be secondary to pulmonary edema or an atypical infectious process. Electronically Signed   By: Constance Holster M.D.   On: 12/02/2018 07:56    Microbiology: Recent Results (from the past 240 hour(s))  WET PREP FOR TRICH, YEAST, CLUE     Status: Abnormal   Collection Time: 11/25/18 12:02 PM   Specimen: Vaginal Fluid   VAGINAL FLUI  Result Value Ref Range Status   Trichomonas Exam Negative Negative Final   Yeast Exam Negative Negative Final   Clue Cell Exam Positive (A) Negative Final    Comment: wbc  >25 bacteria  many epi's  moderate   Urine Culture     Status: None   Collection Time: 11/25/18 12:02 PM   Specimen: Urine   UR  Result Value Ref Range Status   Urine Culture, Routine Final report  Final   Organism ID, Bacteria Comment  Final  Comment: Mixed urogenital flora Less than 10,000 colonies/mL   Microscopic Examination     Status: Abnormal   Collection Time: 11/25/18 12:02 PM   URINE  Result Value Ref Range Status   WBC, UA >30 (A) 0 - 5 /hpf Final   RBC 3-10 (A) 0 - 2 /hpf Final   Epithelial Cells (non renal) 0-10 0 - 10 /hpf Final   Renal Epithel, UA 0-10 (A) None seen /hpf Final   Bacteria, UA Moderate (A) None seen/Few Final  Urine Culture     Status: Abnormal   Collection Time: 11/26/18  5:00 PM   Specimen: Urine, Random  Result Value Ref Range Status   Specimen Description   Final    URINE, RANDOM Performed at [facility], [address], [place], [place] [address]    Special Requests   Final    Normal Performed at [facility], [address], [place], [place] [address]    Culture (A)  Final     <10,000 COLONIES/mL INSIGNIFICANT GROWTH Performed at [facility], [place] [address] [place], [place] [address]    Report Status 11/28/2018 FINAL  Final  SARS Coronavirus 2 [facility] order, Performed in [facility] hospital lab) Nasopharyngeal Nasopharyngeal Swab     Status: None   Collection Time: 12/02/18  6:26 AM   Specimen: Nasopharyngeal Swab  Result Value Ref Range Status   SARS Coronavirus 2 NEGATIVE NEGATIVE Final    Comment: (NOTE) If result is NEGATIVE SARS-CoV-2 target nucleic acids are NOT DETECTED. The SARS-CoV-2 RNA is generally detectable in upper and lower  respiratory specimens during the acute phase of infection. The lowest  concentration of SARS-CoV-2 viral copies this assay can detect is 250  copies / mL. A negative result does not preclude SARS-CoV-2 infection  and should not be used as the sole basis for treatment or other  patient management decisions.  A negative result may occur with  improper specimen collection / handling, submission of specimen other  than nasopharyngeal swab, presence of viral mutation(s) within the  areas targeted by this assay, and inadequate number of viral copies  (<250 copies / mL). A negative result must be combined with clinical  observations, patient history, and epidemiological information. If result is POSITIVE SARS-CoV-2 target nucleic acids are DETECTED. The SARS-CoV-2 RNA is generally detectable in upper and lower  respiratory specimens dur ing the acute phase of infection.  Positive  results are indicative of active infection with SARS-CoV-2.  Clinical  correlation with patient history and other diagnostic information is  necessary to determine patient infection status.  Positive results do  not rule out bacterial infection or co-infection with other viruses. If result is PRESUMPTIVE POSTIVE SARS-CoV-2 nucleic acids MAY BE PRESENT.   A presumptive positive result was obtained on the submitted specimen  and confirmed  on repeat testing.  While 2019 novel coronavirus  (SARS-CoV-2) nucleic acids may be present in the submitted sample  additional confirmatory testing may be necessary for epidemiological  and / or clinical management purposes  to differentiate between  SARS-CoV-2 and other Sarbecovirus currently known to infect humans.  If clinically indicated additional testing with an alternate test  methodology [phone]) is advised. The SARS-CoV-2 RNA is generally  detectable in upper and lower respiratory sp ecimens during the acute  phase of infection. The expected result is Negative. Fact Sheet for Patients:  StrictlyIdeas.no Fact Sheet for Healthcare Providers: BankingDealers.co.za This test is not yet approved or cleared by the [place]  FDA and has been authorized for detection and/or diagnosis of SARS-CoV-2 by FDA under an Emergency Use Authorization (EUA).  This EUA will remain in effect (meaning this test can be used) for the duration of the COVID-19 declaration under Section 564(b)(1) of the Act, 21 U.S.C. section 360bbb-3(b)(1), unless the authorization is terminated or revoked sooner. Performed at [facility], [address] [place], [place] [address]   Culture, blood (Routine x 2)     Status: None (Preliminary result)   Collection Time: 12/02/18  6:52 AM   Specimen: BLOOD RIGHT ARM  Result Value Ref Range Status   Specimen Description BLOOD RIGHT ARM  Final   Special Requests   Final    BOTTLES DRAWN AEROBIC AND ANAEROBIC Blood Culture results may not be optimal due to an inadequate volume of blood received in culture bottles   Culture   Final    NO GROWTH 2 DAYS Performed at [facility], [address] [place], [place] [address]    Report Status PENDING  Incomplete  Culture, blood (Routine x 2)     Status: None (Preliminary result)   Collection Time: 12/02/18  7:17 AM   Specimen: BLOOD RIGHT HAND  Result Value Ref Range Status    Specimen Description BLOOD RIGHT HAND  Final   Special Requests   Final    BOTTLES DRAWN AEROBIC AND ANAEROBIC Blood Culture results may not be optimal due to an inadequate volume of blood received in culture bottles   Culture   Final    NO GROWTH 2 DAYS Performed at [facility], [address], [place], [place] [address]    Report Status PENDING  Incomplete  Urine Culture     Status: Abnormal   Collection Time: 12/02/18  5:12 PM   Specimen: Urine, Random  Result Value Ref Range Status   Specimen Description   Final    URINE, RANDOM Performed at [facility], [address] [place], [place] [address]    Special Requests   Final    NONE Performed at [facility], [address], [place], [place] [address]    Culture MULTIPLE SPECIES PRESENT, SUGGEST RECOLLECTION (A)  Final   Report Status 12/04/2018 FINAL  Final  MRSA PCR Screening     Status: None   Collection Time: 12/02/18  6:11 PM   Specimen: Nasal Mucosa; Nasopharyngeal  Result Value Ref Range Status   MRSA by PCR NEGATIVE NEGATIVE Final    Comment:        The GeneXpert MRSA Assay (FDA approved for NASAL specimens only), is one component of a comprehensive MRSA colonization surveillance program. It is not intended to diagnose MRSA infection nor to guide or monitor treatment for MRSA infections. Performed at [facility], [address], [place], [place] [address]      Labs: Basic Metabolic Panel: Recent Labs  Lab 12/02/18 0716 12/03/18 0358  NA 134* 136  K 4.6 4.7  CL 96* 98  CO2 22 23  GLUCOSE 214* 189*  BUN 68* 43*  CREATININE 9.07* 6.02*  CALCIUM 8.4* 8.2*  PHOS  --  5.0*   Liver Function Tests: Recent Labs  Lab 12/02/18 0716 12/03/18 0358  AST 30  --   ALT 37  --   ALKPHOS 92  --   BILITOT 0.7  --   PROT 6.7  --   ALBUMIN 3.1* 2.6*   CBC: Recent Labs  Lab 12/02/18 0716 12/03/18 0358 12/04/18 0559  WBC 16.9* 14.9* 10.2  NEUTROABS 15.0*  --   --  HGB 10.5* 10.8* 9.8*  HCT 32.8*  34.2* 31.1*  MCV 99.4 100.6* 99.7  PLT 183 136* 155    CBG: Recent Labs  Lab 12/03/18 1126 12/03/18 1639 12/03/18 2046 12/04/18 0747 12/04/18 1106  GLUCAP 314* 371* 393* 282* 109*    Signed:  Barton Dubois MD.  [facility] 12/04/2018, 12:20 PM

## 2018-12-04 NOTE — Progress Notes (Signed)
IV removed, 2x2 gauze and paper tape applied to site, patient tolerated well.  Reviewed AVS with patient who verbalized understanding.  Patient to be transported to lobby via wheelchair and transported home by her husband.

## 2018-12-04 NOTE — Progress Notes (Signed)
Inpatient Diabetes Program Recommendations  AACE/ADA: New Consensus Statement on Inpatient Glycemic Control (2015)  Target Ranges:  Prepandial:   less than 140 mg/dL      Peak postprandial:   less than 180 mg/dL (1-2 hours)      Critically ill patients:  140 - 180 mg/dL   Lab Results  Component Value Date   GLUCAP 109 (H) 12/04/2018   HGBA1C 9.4 (H) 12/02/2018    Review of Glycemic Control Results for [name], STREAM (MRN [id number]) as of 12/04/2018 11:17  Ref. Range 12/03/2018 07:43 12/03/2018 11:26 12/03/2018 16:39 12/03/2018 20:46 12/04/2018 07:47 12/04/2018 11:06  Glucose-Capillary Latest Ref Range: 70 - 99 mg/dL 206 (H) 314 (H) 371 (H) 393 (H) 282 (H) 109 (H)   Diabetes history: DM 2 Outpatient Diabetes medications: Tresiba 10 units, Trulicity 1.5 weekly  Current orders for Inpatient glycemic control:  Lantus 5 units Daily Novolog 0-9 units tid + hs  Inpatient Diabetes Program Recommendations:    Prednisone dose decreasing. Fasting glucose 282 this am. Glucose 109 after dialysis.  Consider increasing Lantus to 8 units Consider Novolog 2 units tid meal coverage if pt consumes at least 50% of meals.  Thanks,  Tama Headings RN, MSN, BC-ADM Inpatient Diabetes Coordinator Team Pager [phone] (8a-5p)

## 2018-12-04 NOTE — Procedures (Signed)
    HEMODIALYSIS TREATMENT NOTE:  3.5 hour heparin-free dialysis treatment completed via left forearm AVF (16g/antegrade). Goal met: 1 liter removed without interruption in ultrafiltration. All blood was returned and hemostasis was achieved in 15 minutes.  [name], RN

## 2018-12-04 NOTE — Procedures (Signed)
I was present at this dialysis session. I have reviewed the session itself and made appropriate changes.   Vital signs in last 24 hours:  Temp:  [97.8 F (36.6 C)-98.5 F (36.9 C)] 98.2 F (36.8 C) (09/30 0730) Pulse Rate:  [50-106] 55 (09/30 0930) Resp:  [11-25] 16 (09/30 0730) BP: (91-142)/(36-78) 91/78 (09/30 0930) SpO2:  [91 %-100 %] 97 % (09/30 0857) Weight:  [69.2 kg] 69.2 kg (09/30 0730) Weight change:  Filed Weights   12/02/18 1817 12/03/18 0500 12/04/18 0730  Weight: 68 kg 68.9 kg 69.2 kg    Recent Labs  Lab 12/03/18 0358  NA 136  K 4.7  CL 98  CO2 23  GLUCOSE 189*  BUN 43*  CREATININE 6.02*  CALCIUM 8.2*  PHOS 5.0*    Recent Labs  Lab 12/02/18 0716 12/03/18 0358 12/04/18 0559  WBC 16.9* 14.9* 10.2  NEUTROABS 15.0*  --   --   HGB 10.5* 10.8* 9.8*  HCT 32.8* 34.2* 31.1*  MCV 99.4 100.6* 99.7  PLT 183 136* 155    Scheduled Meds: . aspirin EC  81 mg Oral Daily  . Chlorhexidine Gluconate Cloth  6 each Topical Q0600  . heparin  5,000 Units Subcutaneous Q8H  . insulin aspart  0-5 Units Subcutaneous QHS  . insulin aspart  0-9 Units Subcutaneous TID WC  . insulin glargine  5 Units Subcutaneous Daily  . metoprolol tartrate  12.5 mg Oral BID  . predniSONE  5 mg Oral Q breakfast  . rosuvastatin  20 mg Oral Daily  . sevelamer carbonate  1,600 mg Oral With snacks  . sevelamer carbonate  3,200 mg Oral TID WC  . tacrolimus  0.5 mg Oral QHS  . tacrolimus  1 mg Oral q morning - 10a   Continuous Infusions: . sodium chloride 500 mL (12/03/18 2227)  . sodium chloride    . sodium chloride    . ceFEPime (MAXIPIME) IV 1 g (12/02/18 2307)   PRN Meds:.sodium chloride, sodium chloride, sodium chloride, acetaminophen **OR** acetaminophen, lidocaine (PF), lidocaine-prilocaine, ondansetron **OR** ondansetron (ZOFRAN) IV, pentafluoroprop-tetrafluoroeth, zolpidem    Dialysis Orders: [facility] M[facility]. [id number] kgHD Bath 2K/2.5CaTime 3:30Heparin none.  AccessLAVFBFR 300DFR 600  Hectoral 2.15mcg IV/HD, epo 2400 units tiw  Assessment/Plan: 1. Sepsis presumably due to lobar pneumonia- on cefepime per primary svc with improvement clinically and by labs.  2. ESRD- Plan to keep on MWF schedule while she remains an inpatient. 3. Acute hypoxic respiratory failure- now off of non-rebreather and on nasal canula per primary svc 4. Hypertension/volume- UF as tolerated 5. Anemia- will switch to aranesp while she remains an inpatient 6. Metabolic bone disease- Cont to follow 7. Nutrition- renal diet 8. DM- per primary svc 9. HLD- on statin 10. CAD- asymptomatic 11. Disposition- plan for discharge to home after HD today with po abx and f/u with her primary nephrologist on Friday.   Donetta Potts,  MD 12/04/2018, 9:43 AM

## 2018-12-04 NOTE — TOC Progression Note (Addendum)
Transition of Care [facility]) - Progression Note    Patient Details  Name: [name] MRN: [id number] Date of Birth: [birth year]  Transition of Care [facility]) CM/SW Contact  Ihor Gully, LCSW Phone Number: 12/04/2018, 11:27 AM  Clinical Narrative:    Patient is agreeable to [facility] in [place] as no [place] is accepting her insurance. Attending notified to place order for [facility] in [place]. Hospital discharge follow up appointment scheduled for 12/09/2018 at 10:10 via televisit.    Expected Discharge Plan: Home/Self Care Barriers to Discharge: Continued Medical Work up  Expected Discharge Plan and Services Expected Discharge Plan: Home/Self Care                                               Social Determinants of Health (SDOH) Interventions    Readmission Risk Interventions Readmission Risk Prevention Plan 12/03/2018  Transportation Screening Complete  PCP or Specialist Appt within 3-5 Days Not Complete  HRI or [place] Complete  Social Work Consult for [place] Planning/Counseling Complete  Palliative Care Screening Not Complete  Medication Review Press photographer) Complete  Some recent data might be hidden

## 2018-12-05 ENCOUNTER — Encounter: Payer: Self-pay | Admitting: Family Medicine

## 2018-12-05 ENCOUNTER — Ambulatory Visit (INDEPENDENT_AMBULATORY_CARE_PROVIDER_SITE_OTHER): Payer: BC Managed Care – PPO | Admitting: Family Medicine

## 2018-12-05 DIAGNOSIS — R443 Hallucinations, unspecified: Secondary | ICD-10-CM | POA: Diagnosis not present

## 2018-12-05 NOTE — Progress Notes (Signed)
Virtual Visit via telephone Note Due to COVID-19 pandemic this visit was conducted virtually. This visit type was conducted due to national recommendations for restrictions regarding the COVID-19 Pandemic (e.g. social distancing, sheltering in place) in an effort to limit this patient's exposure and mitigate transmission in our community. All issues noted in this document were discussed and addressed.  A physical exam was not performed with this format.   I connected with [name] and her daughter on 12/05/18 at 34 by telephone and verified that I am speaking with the correct person using two identifiers. [name] is currently located at home and family is currently with them during visit. The provider, Monia Pouch, FNP is located in their office at time of visit.  I discussed the limitations, risks, security and privacy concerns of performing an evaluation and management service by telephone and the availability of in person appointments. I also discussed with the patient that there may be a patient responsible charge related to this service. The patient expressed understanding and agreed to proceed.  Subjective:  Patient ID: [name], female    DOB: 1955-11-13, 63 y.o.   MRN: [id number]  Chief Complaint:  Hallucinations   HPI: [name] is a 63 y.o. female presenting on 12/05/2018 for Hallucinations   Pt was discharged from [facility] on 12/04/2018. Pt was admitted on 12/02/2018. Discharge diagnoses: Essential hypertension, ESRD (end stage renal disease) ([place]), Sepsis due to undetermined organism ([place]), Lobar pneumonia ([place]), Acute respiratory failure with hypoxia ([place]), HCAP (healthcare-associated pneumonia).   Daughter reports pt called her stating she was not going to take the antibiotic anymore due to having hallucinations. Daughter reports pt told her she was swimming in a pool of vomit in the bed and other off the wall comments. States her mother now  refuses to take anymore of the Augmentin and would like another antibiotic called in. Pt is not on home oxygen and has not measured blood pressure or oxygen saturation at home. She does have general weakness and fatigue.     Relevant past medical, surgical, family, and social history reviewed and updated as indicated.  Allergies and medications reviewed and updated.   Past Medical History:  Diagnosis Date  . Anemia    of chronic disease  . Blood transfusion without reported diagnosis   . Cardiovascular disease    nonobstructive  . Carotid artery stenosis 2008  . CHF (congestive heart failure) ([place])   . Coronary artery disease   . Diabetes mellitus   . Dyslipidemia   . Edema of lower extremity    with hypo-albuminemia and profound protenuria  . Heart murmur   . Hypertension   . Mitral regurgitation   . Nephrotic syndrome   . Patent foramen ovale   . Pulmonary hypertension, moderate to severe ([place])   . Pulmonary nodule   . Tricuspid regurgitation   . Volume depletion, renal, due to output loss (renal deficit)     Past Surgical History:  Procedure Laterality Date  . A/V FISTULAGRAM N/A 04/05/2017   Procedure: A/V FISTULAGRAM - Left Arm;  Surgeon: Angelia Mould, MD;  Location: [facility];  Service: Cardiovascular;  Laterality: N/A;  . A/V FISTULAGRAM Left 09/18/2017   Procedure: A/V FISTULAGRAM;  Surgeon: Serafina Mitchell, MD;  Location: [facility];  Service: Cardiovascular;  Laterality: Left;  . A/V SHUNT INTERVENTION Left 04/05/2017   Procedure: A/V SHUNT INTERVENTION;  Surgeon: Angelia Mould, MD;  Location: [facility];  Service: Cardiovascular;  Laterality: Left;  . CARDIAC CATHETERIZATION  2008  . IR REMOVAL TUN CV CATH W/O FL  11/02/2016  . 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 PERIPHERAL VASCULAR BALLOON ANGIOPLASTY Left 09/18/2017   Procedure: PERIPHERAL VASCULAR BALLOON ANGIOPLASTY;  Surgeon: Serafina Mitchell, MD;  Location: [facility];  Service: Cardiovascular;  Laterality: Left;  Arm [place]    Social History   Socioeconomic History  . Marital status: Married    Spouse name: Not on file  . Number of children: Not on file  . Years of education: Not on file  . Highest education level: Not on file  Occupational History  . Not on file  Social Needs  . Financial resource strain: Not on file  . Food insecurity    Worry: Not on file    Inability: Not on file  . Transportation needs    Medical: Not on file    Non-medical: Not on file  Tobacco Use  . Smoking status: Never Smoker  . Smokeless tobacco: Never Used  Substance and Sexual Activity  . Alcohol use: No  . Drug use: No  . Sexual activity: Never  Lifestyle  . Physical activity    Days per week: Not on file    Minutes per session: Not on file  . Stress: Not on file  Relationships  . Social Herbalist on phone: Not on file    Gets together: Not on file    Attends religious service: Not on file    Active member of club or organization: Not on file    Attends meetings of clubs or organizations: Not on file    Relationship status: Not on file  . Intimate partner violence    Fear of current or ex partner: Not on file    Emotionally abused: Not on file    Physically abused: Not on file    Forced sexual activity: Not on file  Other Topics Concern  . Not on file  Social History Narrative  . Not on file    Outpatient Encounter Medications as of 12/05/2018  Medication Sig  . acetaminophen (TYLENOL) 500 MG tablet Take 500 mg by mouth every 6 (six) hours as needed for moderate pain or headache.   . albuterol (PROVENTIL HFA;VENTOLIN HFA) 108 (90 Base) MCG/ACT inhaler Inhale 1-2 puffs into the lungs every 6 (six) hours as needed for wheezing or shortness of breath. (Patient not taking: Reported on 12/03/2018)  . amLODipine (NORVASC) 5 MG tablet Take 1 tablet by mouth nightly (Patient taking differently: Take 5 mg by mouth at bedtime.  )  . amoxicillin-clavulanate (AUGMENTIN) 500-125 MG tablet Take 1 tablet (500 mg total) by mouth 2 (two) times daily for 5 days.  Marland Kitchen aspirin (ASPIR-LOW) 81 MG EC tablet Take 81 mg by mouth daily.   . B Complex-C-Folic Acid (RENA-VITE RX) 1 MG TABS Take 1 tablet by mouth daily.  . fluticasone (FLONASE) 50 MCG/ACT nasal spray Place 2 sprays into both nostrils daily. (Patient not taking: Reported on 12/03/2018)  . insulin degludec (TRESIBA FLEXTOUCH) 100 UNIT/ML SOPN FlexTouch Pen Inject 0.1 mLs (10 Units total) into the skin daily at 10 pm.  . isosorbide mononitrate (IMDUR) 60 MG 24 hr tablet Take 1 tablet by mouth once daily (Patient taking differently: Take 60 mg by mouth daily. )  . lidocaine-prilocaine (EMLA) cream Apply 1 application topically as needed (for dialysis).   . meclizine (ANTIVERT)  25 MG tablet Take 1 tablet (25 mg total) by mouth 2 (two) times daily as needed for dizziness.  . metoprolol tartrate (LOPRESSOR) 25 MG tablet Take 1/2 (one-half) tablet by mouth twice daily (Patient taking differently: Take 12.5 mg by mouth 2 (two) times daily. )  . ondansetron (ZOFRAN ODT) 4 MG disintegrating tablet 4mg  ODT q4 hours prn nausea/vomit (Patient taking differently: Take 4 mg by mouth 2 (two) times daily. 4mg  ODT q4 hours prn nausea/vomit)  . predniSONE (DELTASONE) 5 MG tablet TAKE 1 TABLET BY MOUTH ONCE DAILY WITH BREAKFAST  . PROGRAF 0.5 MG capsule Take 0.5-1 mg by mouth See admin instructions. Take 1 mg by mouth in the morning and take 0.5 mg by mouth at bedtime  . rosuvastatin (CRESTOR) 20 MG tablet Take 1 tablet by mouth once daily (Patient taking differently: Take 20 mg by mouth daily. )  . sevelamer carbonate (RENVELA) 800 MG tablet Take 800 mg by mouth See admin instructions. 4 tablets with meals and 2 tablets with snacks.  . triamcinolone ointment (KENALOG) 0.5 % Apply 1 application topically 2 (two) times daily.  . TRULICITY 1.5 0000000 SOPN INJECT 1 DOSE (1.5 MG) SUBCUTANEOUSLY ONCE A  WEEK (Patient taking differently: Inject 1.5 mg into the skin once a week. )   No facility-administered encounter medications on file as of 12/05/2018.     Allergies  Allergen Reactions  . Other Other (See Comments)    Tape Bruises and tears skin, Paper tape tolerated  . Tape Other (See Comments)    Bruises and tears skin Paper tape tolerated Bruises and tears skin Paper tape tolerated Bruises and tears skin Paper tape tolerated    Review of Systems  Constitutional: Positive for activity change, appetite change, chills and fatigue. Negative for diaphoresis, fever and unexpected weight change.  HENT: Positive for congestion.   Eyes: Negative for photophobia and visual disturbance.  Respiratory: Positive for cough and shortness of breath.   Cardiovascular: Negative for palpitations and leg swelling.  Gastrointestinal: Negative for abdominal pain, constipation, diarrhea, nausea and vomiting.  Genitourinary: Negative for decreased urine volume and difficulty urinating.  Musculoskeletal: Positive for arthralgias and myalgias.  Skin: Positive for pallor.  Neurological: Positive for weakness. Negative for dizziness and headaches.  Psychiatric/Behavioral: Positive for confusion and hallucinations.  All other systems reviewed and are negative.        Observations/Objective: No vital signs or physical exam, this was a telephone or virtual health encounter.  Pt alert and oriented, answers all questions appropriately, and able to speak in full sentences.    Assessment and Plan: Darsey was seen today for hallucinations.  Diagnoses and all orders for this visit:  Hallucinations Reported hallucinations after discharge home form hospital yesterday. Pt feels this is due to the Augmentin. Advised pt and pts daughter the hallucinations could be caused by several things such as hypoxia, declining kidney or liver function, medications, neurovascular event, etc. Daughter advised to take pt  back to the ED for evaluation and treatment. Daughter verbalized understanding and agrees to plan.     Follow Up Instructions: ED for evaluation and treatment.    I discussed the assessment and treatment plan with the patient. The patient was provided an opportunity to ask questions and all were answered. The patient agreed with the plan and demonstrated an understanding of the instructions.   The patient was advised to call back or seek an in-person evaluation if the symptoms worsen or if the condition fails to improve as  anticipated.  The above assessment and management plan was discussed with the patient. The patient verbalized understanding of and has agreed to the management plan. Patient is aware to call the clinic if they develop any new symptoms or if symptoms persist or worsen. Patient is aware when to return to the clinic for a follow-up visit. Patient educated on when it is appropriate to go to the emergency department.    I provided 15 minutes of non-face-to-face time during this encounter. The call started at 1040. The call ended at 1055. The other time was used for coordination of care.    Monia Pouch, FNP-C [facility] [address] [place], [place] [address] [phone] 12/05/18

## 2018-12-06 DIAGNOSIS — J189 Pneumonia, unspecified organism: Secondary | ICD-10-CM | POA: Diagnosis not present

## 2018-12-06 DIAGNOSIS — Z23 Encounter for immunization: Secondary | ICD-10-CM | POA: Diagnosis not present

## 2018-12-06 DIAGNOSIS — N186 End stage renal disease: Secondary | ICD-10-CM | POA: Diagnosis not present

## 2018-12-06 DIAGNOSIS — Z992 Dependence on renal dialysis: Secondary | ICD-10-CM | POA: Diagnosis not present

## 2018-12-07 LAB — CULTURE, BLOOD (ROUTINE X 2)
Culture: NO GROWTH
Culture: NO GROWTH

## 2018-12-09 ENCOUNTER — Ambulatory Visit: Payer: BC Managed Care – PPO | Admitting: Family

## 2018-12-09 DIAGNOSIS — Z992 Dependence on renal dialysis: Secondary | ICD-10-CM | POA: Diagnosis not present

## 2018-12-09 DIAGNOSIS — J189 Pneumonia, unspecified organism: Secondary | ICD-10-CM | POA: Diagnosis not present

## 2018-12-09 DIAGNOSIS — N186 End stage renal disease: Secondary | ICD-10-CM | POA: Diagnosis not present

## 2018-12-09 DIAGNOSIS — Z23 Encounter for immunization: Secondary | ICD-10-CM | POA: Diagnosis not present

## 2018-12-10 ENCOUNTER — Encounter: Payer: Self-pay | Admitting: Family

## 2018-12-10 ENCOUNTER — Ambulatory Visit: Payer: BC Managed Care – PPO | Admitting: Family

## 2018-12-10 ENCOUNTER — Ambulatory Visit (INDEPENDENT_AMBULATORY_CARE_PROVIDER_SITE_OTHER): Payer: BC Managed Care – PPO | Admitting: Family

## 2018-12-10 DIAGNOSIS — Z09 Encounter for follow-up examination after completed treatment for conditions other than malignant neoplasm: Secondary | ICD-10-CM

## 2018-12-10 DIAGNOSIS — J181 Lobar pneumonia, unspecified organism: Secondary | ICD-10-CM | POA: Diagnosis not present

## 2018-12-10 DIAGNOSIS — N186 End stage renal disease: Secondary | ICD-10-CM | POA: Diagnosis not present

## 2018-12-10 DIAGNOSIS — J189 Pneumonia, unspecified organism: Secondary | ICD-10-CM | POA: Diagnosis not present

## 2018-12-10 NOTE — Progress Notes (Signed)
   Virtual Visit via telephone Note Due to COVID-19 pandemic this visit was conducted virtually. This visit type was conducted due to national recommendations for restrictions regarding the COVID-19 Pandemic (e.g. social distancing, sheltering in place) in an effort to limit this patient's exposure and mitigate transmission in our community. All issues noted in this document were discussed and addressed.  A physical exam was not performed with this format.  I connected with [name] on 12/10/18 at 9:15 AM by telephone and verified that I am speaking with the correct person using two identifiers. [name] is currently located at home and no one is currently with her during visit. The provider, Evelina Dun, FNP is located in their office at time of visit.  I discussed the limitations, risks, security and privacy concerns of performing an evaluation and management service by telephone and the availability of in person appointments. I also discussed with the patient that there may be a patient responsible charge related to this service. The patient expressed understanding and agreed to proceed.   History and Present Illness:  HPI  Pt presents to the office today for hospital follow up. She admitted on 12/02/18 with SOB, fevers and found to have pneumonia. She was given vancomycin, cefepime, and Metronidazole in ED. She was negative for COVID and MRSA.   She was discharged on 12/04/18 with Augmentin for 5 days. She states the Augmentin started giving her hallucinations so she stopped it. Reports she has 3 tablets she did not complete. Denies any fever, SOB, chest pain. States she has intermittent nonproductive cough.   She continues to get dialysis on M,W,F and doing.   Review of Systems  All other systems reviewed and are negative.    Observations/Objective: No SOB or distress noted   Assessment and Plan: 1. ESRD (end stage renal disease) ([place]) - CMP14+EGFR; Future - CBC with  Differential/Platelet; Future  2. Hospital discharge follow-up - CMP14+EGFR; Future - CBC with Differential/Platelet; Future - DG Chest 2 View; Future  3. Lobar pneumonia ([place]) - CMP14+EGFR; Future - CBC with Differential/Platelet; Future - DG Chest 2 View; Future  4. HCAP (healthcare-associated pneumonia) - CMP14+EGFR; Future - CBC with Differential/Platelet; Future - DG Chest 2 View; Future  Pt will come in next week to have chest x-ray repeated and lab work Discussed importance of calling if she has any cough, SOB, or fevers or go to ED Keep dialysis appts    I discussed the assessment and treatment plan with the patient. The patient was provided an opportunity to ask questions and all were answered. The patient agreed with the plan and demonstrated an understanding of the instructions.   The patient was advised to call back or seek an in-person evaluation if the symptoms worsen or if the condition fails to improve as anticipated.  The above assessment and management plan was discussed with the patient. The patient verbalized understanding of and has agreed to the management plan. Patient is aware to call the clinic if symptoms persist or worsen. Patient is aware when to return to the clinic for a follow-up visit. Patient educated on when it is appropriate to go to the emergency department.   Time call ended:  9:34 AM  I provided 19 minutes of non-face-to-face time during this encounter.    Evelina Dun, FNP

## 2018-12-11 DIAGNOSIS — Z992 Dependence on renal dialysis: Secondary | ICD-10-CM | POA: Diagnosis not present

## 2018-12-11 DIAGNOSIS — J189 Pneumonia, unspecified organism: Secondary | ICD-10-CM | POA: Diagnosis not present

## 2018-12-11 DIAGNOSIS — Z23 Encounter for immunization: Secondary | ICD-10-CM | POA: Diagnosis not present

## 2018-12-11 DIAGNOSIS — N186 End stage renal disease: Secondary | ICD-10-CM | POA: Diagnosis not present

## 2018-12-13 DIAGNOSIS — N186 End stage renal disease: Secondary | ICD-10-CM | POA: Diagnosis not present

## 2018-12-13 DIAGNOSIS — J189 Pneumonia, unspecified organism: Secondary | ICD-10-CM | POA: Diagnosis not present

## 2018-12-13 DIAGNOSIS — Z992 Dependence on renal dialysis: Secondary | ICD-10-CM | POA: Diagnosis not present

## 2018-12-13 DIAGNOSIS — Z23 Encounter for immunization: Secondary | ICD-10-CM | POA: Diagnosis not present

## 2018-12-15 ENCOUNTER — Encounter (HOSPITAL_COMMUNITY): Payer: Self-pay | Admitting: Emergency Medicine

## 2018-12-15 ENCOUNTER — Emergency Department (HOSPITAL_COMMUNITY): Payer: BC Managed Care – PPO

## 2018-12-15 ENCOUNTER — Other Ambulatory Visit: Payer: Self-pay

## 2018-12-15 ENCOUNTER — Inpatient Hospital Stay (HOSPITAL_COMMUNITY)
Admission: EM | Admit: 2018-12-15 | Discharge: 2018-12-19 | DRG: 177 | Disposition: A | Discharge status: Home / Self Care | Payer: BC Managed Care – PPO | Attending: Internal Medicine | Admitting: Internal Medicine

## 2018-12-15 DIAGNOSIS — T380X5A Adverse effect of glucocorticoids and synthetic analogues, initial encounter: Secondary | ICD-10-CM | POA: Diagnosis not present

## 2018-12-15 DIAGNOSIS — E785 Hyperlipidemia, unspecified: Secondary | ICD-10-CM | POA: Diagnosis present

## 2018-12-15 DIAGNOSIS — N2581 Secondary hyperparathyroidism of renal origin: Secondary | ICD-10-CM | POA: Diagnosis present

## 2018-12-15 DIAGNOSIS — D6489 Other specified anemias: Secondary | ICD-10-CM | POA: Diagnosis present

## 2018-12-15 DIAGNOSIS — Z9862 Peripheral vascular angioplasty status: Secondary | ICD-10-CM

## 2018-12-15 DIAGNOSIS — I081 Rheumatic disorders of both mitral and tricuspid valves: Secondary | ICD-10-CM | POA: Diagnosis present

## 2018-12-15 DIAGNOSIS — R918 Other nonspecific abnormal finding of lung field: Secondary | ICD-10-CM | POA: Diagnosis not present

## 2018-12-15 DIAGNOSIS — T17890A Other foreign object in other parts of respiratory tract causing asphyxiation, initial encounter: Secondary | ICD-10-CM | POA: Diagnosis not present

## 2018-12-15 DIAGNOSIS — Z79899 Other long term (current) drug therapy: Secondary | ICD-10-CM

## 2018-12-15 DIAGNOSIS — I953 Hypotension of hemodialysis: Secondary | ICD-10-CM | POA: Diagnosis not present

## 2018-12-15 DIAGNOSIS — Q211 Atrial septal defect: Secondary | ICD-10-CM

## 2018-12-15 DIAGNOSIS — Y830 Surgical operation with transplant of whole organ as the cause of abnormal reaction of the patient, or of later complication, without mention of misadventure at the time of the procedure: Secondary | ICD-10-CM | POA: Diagnosis present

## 2018-12-15 DIAGNOSIS — Z841 Family history of disorders of kidney and ureter: Secondary | ICD-10-CM

## 2018-12-15 DIAGNOSIS — Z91048 Other nonmedicinal substance allergy status: Secondary | ICD-10-CM

## 2018-12-15 DIAGNOSIS — K761 Chronic passive congestion of liver: Secondary | ICD-10-CM | POA: Diagnosis present

## 2018-12-15 DIAGNOSIS — Z992 Dependence on renal dialysis: Secondary | ICD-10-CM | POA: Diagnosis not present

## 2018-12-15 DIAGNOSIS — I2510 Atherosclerotic heart disease of native coronary artery without angina pectoris: Secondary | ICD-10-CM | POA: Diagnosis present

## 2018-12-15 DIAGNOSIS — Z794 Long term (current) use of insulin: Secondary | ICD-10-CM

## 2018-12-15 DIAGNOSIS — R0902 Hypoxemia: Secondary | ICD-10-CM

## 2018-12-15 DIAGNOSIS — I5033 Acute on chronic diastolic (congestive) heart failure: Secondary | ICD-10-CM | POA: Diagnosis not present

## 2018-12-15 DIAGNOSIS — E1165 Type 2 diabetes mellitus with hyperglycemia: Secondary | ICD-10-CM | POA: Diagnosis not present

## 2018-12-15 DIAGNOSIS — Z7952 Long term (current) use of systemic steroids: Secondary | ICD-10-CM

## 2018-12-15 DIAGNOSIS — T8612 Kidney transplant failure: Secondary | ICD-10-CM | POA: Diagnosis present

## 2018-12-15 DIAGNOSIS — J9819 Other pulmonary collapse: Secondary | ICD-10-CM | POA: Diagnosis present

## 2018-12-15 DIAGNOSIS — J9621 Acute and chronic respiratory failure with hypoxia: Secondary | ICD-10-CM

## 2018-12-15 DIAGNOSIS — R0602 Shortness of breath: Secondary | ICD-10-CM | POA: Diagnosis not present

## 2018-12-15 DIAGNOSIS — I4581 Long QT syndrome: Secondary | ICD-10-CM | POA: Diagnosis present

## 2018-12-15 DIAGNOSIS — I272 Pulmonary hypertension, unspecified: Secondary | ICD-10-CM | POA: Diagnosis present

## 2018-12-15 DIAGNOSIS — J690 Pneumonitis due to inhalation of food and vomit: Secondary | ICD-10-CM | POA: Diagnosis not present

## 2018-12-15 DIAGNOSIS — Z7951 Long term (current) use of inhaled steroids: Secondary | ICD-10-CM

## 2018-12-15 DIAGNOSIS — Z20828 Contact with and (suspected) exposure to other viral communicable diseases: Secondary | ICD-10-CM | POA: Diagnosis present

## 2018-12-15 DIAGNOSIS — E1122 Type 2 diabetes mellitus with diabetic chronic kidney disease: Secondary | ICD-10-CM | POA: Diagnosis present

## 2018-12-15 DIAGNOSIS — R0600 Dyspnea, unspecified: Secondary | ICD-10-CM | POA: Diagnosis present

## 2018-12-15 DIAGNOSIS — E1169 Type 2 diabetes mellitus with other specified complication: Secondary | ICD-10-CM | POA: Diagnosis present

## 2018-12-15 DIAGNOSIS — Z7982 Long term (current) use of aspirin: Secondary | ICD-10-CM

## 2018-12-15 DIAGNOSIS — J9601 Acute respiratory failure with hypoxia: Secondary | ICD-10-CM | POA: Diagnosis present

## 2018-12-15 DIAGNOSIS — N186 End stage renal disease: Secondary | ICD-10-CM | POA: Diagnosis not present

## 2018-12-15 DIAGNOSIS — J189 Pneumonia, unspecified organism: Secondary | ICD-10-CM

## 2018-12-15 DIAGNOSIS — Z8619 Personal history of other infectious and parasitic diseases: Secondary | ICD-10-CM

## 2018-12-15 DIAGNOSIS — I132 Hypertensive heart and chronic kidney disease with heart failure and with stage 5 chronic kidney disease, or end stage renal disease: Secondary | ICD-10-CM | POA: Diagnosis present

## 2018-12-15 DIAGNOSIS — D509 Iron deficiency anemia, unspecified: Secondary | ICD-10-CM | POA: Diagnosis present

## 2018-12-15 DIAGNOSIS — Z833 Family history of diabetes mellitus: Secondary | ICD-10-CM

## 2018-12-15 DIAGNOSIS — I120 Hypertensive chronic kidney disease with stage 5 chronic kidney disease or end stage renal disease: Secondary | ICD-10-CM | POA: Diagnosis not present

## 2018-12-15 DIAGNOSIS — D631 Anemia in chronic kidney disease: Secondary | ICD-10-CM | POA: Diagnosis present

## 2018-12-15 DIAGNOSIS — R7989 Other specified abnormal findings of blood chemistry: Secondary | ICD-10-CM

## 2018-12-15 DIAGNOSIS — Z8249 Family history of ischemic heart disease and other diseases of the circulatory system: Secondary | ICD-10-CM

## 2018-12-15 DIAGNOSIS — I509 Heart failure, unspecified: Secondary | ICD-10-CM | POA: Diagnosis not present

## 2018-12-15 DIAGNOSIS — N179 Acute kidney failure, unspecified: Secondary | ICD-10-CM | POA: Diagnosis not present

## 2018-12-15 HISTORY — DX: Other pulmonary collapse: J98.19

## 2018-12-15 LAB — COMPREHENSIVE METABOLIC PANEL
ALT: 56 U/L — ABNORMAL HIGH (ref 0–44)
AST: 47 U/L — ABNORMAL HIGH (ref 15–41)
Albumin: 3.1 g/dL — ABNORMAL LOW (ref 3.5–5.0)
Alkaline Phosphatase: 108 U/L (ref 38–126)
Anion gap: 13 (ref 5–15)
BUN: 39 mg/dL — ABNORMAL HIGH (ref 8–23)
CO2: 27 mmol/L (ref 22–32)
Calcium: 8.5 mg/dL — ABNORMAL LOW (ref 8.9–10.3)
Chloride: 98 mmol/L (ref 98–111)
Creatinine, Ser: 6.72 mg/dL — ABNORMAL HIGH (ref 0.44–1.00)
GFR calc Af Amer: 7 mL/min — ABNORMAL LOW (ref >60)
GFR calc non Af Amer: 6 mL/min — ABNORMAL LOW (ref >60)
Glucose, Bld: 112 mg/dL — ABNORMAL HIGH (ref 70–99)
Potassium: 3.9 mmol/L (ref 3.5–5.1)
Sodium: 138 mmol/L (ref 135–145)
Total Bilirubin: 0.7 mg/dL (ref 0.3–1.2)
Total Protein: 7 g/dL (ref 6.5–8.1)

## 2018-12-15 LAB — CBC WITH DIFFERENTIAL/PLATELET
Abs Immature Granulocytes: 0.07 10*3/uL (ref 0.00–0.07)
Basophils Absolute: 0.1 10*3/uL (ref 0.0–0.1)
Basophils Relative: 1 %
Eosinophils Absolute: 0.4 10*3/uL (ref 0.0–0.5)
Eosinophils Relative: 3 %
HCT: 28.6 % — ABNORMAL LOW (ref 36.0–46.0)
Hemoglobin: 8.8 g/dL — ABNORMAL LOW (ref 12.0–15.0)
Immature Granulocytes: 1 %
Lymphocytes Relative: 7 %
Lymphs Abs: 0.9 10*3/uL (ref 0.7–4.0)
MCH: 31.3 pg (ref 26.0–34.0)
MCHC: 30.8 g/dL (ref 30.0–36.0)
MCV: 101.8 fL — ABNORMAL HIGH (ref 80.0–100.0)
Monocytes Absolute: 0.6 10*3/uL (ref 0.1–1.0)
Monocytes Relative: 5 %
Neutro Abs: 10.2 10*3/uL — ABNORMAL HIGH (ref 1.7–7.7)
Neutrophils Relative %: 83 %
Platelets: 221 10*3/uL (ref 150–400)
RBC: 2.81 MIL/uL — ABNORMAL LOW (ref 3.87–5.11)
RDW: 13.7 % (ref 11.5–15.5)
WBC: 12.2 10*3/uL — ABNORMAL HIGH (ref 4.0–10.5)
nRBC: 0 % (ref 0.0–0.2)

## 2018-12-15 LAB — SARS CORONAVIRUS 2 BY RT PCR (HOSPITAL ORDER, PERFORMED IN ~~LOC~~ HOSPITAL LAB): SARS Coronavirus 2: NEGATIVE

## 2018-12-15 LAB — PROCALCITONIN: Procalcitonin: 1.15 ng/mL

## 2018-12-15 LAB — FERRITIN: Ferritin: 1064 ng/mL — ABNORMAL HIGH (ref 11–307)

## 2018-12-15 LAB — BRAIN NATRIURETIC PEPTIDE: B Natriuretic Peptide: >4500 pg/mL — ABNORMAL HIGH (ref 0.0–100.0)

## 2018-12-15 LAB — LACTATE DEHYDROGENASE: LDH: 194 U/L — ABNORMAL HIGH (ref 98–192)

## 2018-12-15 LAB — TRIGLYCERIDES: Triglycerides: 95 mg/dL (ref <150)

## 2018-12-15 LAB — FIBRINOGEN: Fibrinogen: 467 mg/dL (ref 210–475)

## 2018-12-15 LAB — LACTIC ACID, PLASMA
Lactic Acid, Venous: 1.8 mmol/L (ref 0.5–1.9)
Lactic Acid, Venous: 1.2 mmol/L (ref 0.5–1.9)

## 2018-12-15 LAB — D-DIMER, QUANTITATIVE: D-Dimer, Quant: 1.57 ug/mL-FEU — ABNORMAL HIGH (ref 0.00–0.50)

## 2018-12-15 LAB — C-REACTIVE PROTEIN: CRP: 5.7 mg/dL — ABNORMAL HIGH (ref <1.0)

## 2018-12-15 MED ORDER — VANCOMYCIN HCL 1.25 G IV SOLR
1250.0000 mg | Freq: Once | INTRAVENOUS | Status: AC
  Administered 2018-12-15: 1250 mg via INTRAVENOUS
  Filled 2018-12-15: qty 1250

## 2018-12-15 MED ORDER — IOHEXOL 350 MG/ML SOLN
100.0000 mL | Freq: Once | INTRAVENOUS | Status: AC | PRN
  Administered 2018-12-15: 100 mL via INTRAVENOUS

## 2018-12-15 MED ORDER — SODIUM CHLORIDE 0.9 % IV SOLN
1.0000 g | Freq: Once | INTRAVENOUS | Status: AC
  Administered 2018-12-15: 1 g via INTRAVENOUS
  Filled 2018-12-15 (×2): qty 1

## 2018-12-15 MED ORDER — VANCOMYCIN HCL IN DEXTROSE 750-5 MG/150ML-% IV SOLN
750.0000 mg | INTRAVENOUS | Status: DC
  Filled 2018-12-15: qty 150

## 2018-12-15 MED ORDER — SODIUM CHLORIDE 0.9 % IV SOLN: 1.0000 g | INTRAVENOUS | Status: DC

## 2018-12-15 MED ORDER — ONDANSETRON HCL 4 MG/2ML IJ SOLN
4.0000 mg | Freq: Once | INTRAMUSCULAR | Status: AC
  Administered 2018-12-15: 4 mg via INTRAVENOUS
  Filled 2018-12-15: qty 2

## 2018-12-15 NOTE — ED Notes (Signed)
Spoke with Dr [name] regarding pt CT- results are back indicating collapse of left lower lobe and partial collapse of right lower lobe- Dr is reviewing results and will change pt disposition accordingly.

## 2018-12-15 NOTE — ED Notes (Signed)
Pt sats were ranging from 77%-82% on 2L o2 via [place]. Pt c/o some nausea.    EDP notified.   Placed on 100% o2 via nrb by Dr. Rogene [place].   Pt sats currently 100%.

## 2018-12-15 NOTE — ED Triage Notes (Signed)
Pt reports increasing sob starting around 2am. Last dialysis on Friday and wasn't feeling well then and almost passed out. Does not wear home o2 but checks her sats intermittently and reports 74% at home.

## 2018-12-15 NOTE — ED Provider Notes (Signed)
[facility] EMERGENCY DEPARTMENT Provider Note   CSN: [id number] Arrival date & time: 12/15/18  1110     History   Chief Complaint Chief Complaint  Patient presents with  . Shortness of Breath    HPI [name] is a 63 y.o. female.     Patient brought in by family member.  Patient is a dialysis patient.  Patient normally dialyzed Monday Wednesdays and Fridays.  Last dialyzed on Friday.  Patient was not feeling well then and almost passed out.  Patient does not use any oxygen at home.  But does follow her sats carefully.  She said her sats here today were 74% at home.  And patient had increasing shortness of breath around 2 in the morning.  Denied any fevers nausea or vomiting.  Or any congestion.  Past medical history significant for end-stage renal disease congestive heart failure diabetes nephrotic syndrome and tricuspid regurgitation mitral regurgitation.  Patient with admission to the hospital September 28 for fever unspecified cause.  Patient clinically appears in respiratory distress and very short of breath.  Patient is receiving dialysis for the past 2 years.  AV fistula right upper extremity.     Past Medical History:  Diagnosis Date  . Anemia    of chronic disease  . Blood transfusion without reported diagnosis   . Cardiovascular disease    nonobstructive  . Carotid artery stenosis 2008  . CHF (congestive heart failure) ([place])   . Coronary artery disease   . Diabetes mellitus   . Dyslipidemia   . Edema of lower extremity    with hypo-albuminemia and profound protenuria  . Heart murmur   . Hypertension   . Mitral regurgitation   . Nephrotic syndrome   . Patent foramen ovale   . Pulmonary hypertension, moderate to severe ([place])   . Pulmonary nodule   . Tricuspid regurgitation   . Volume depletion, renal, due to output loss (renal deficit)     Patient Active Problem List   Diagnosis Date Noted  . Dyspnea 12/15/2018  . HCAP (healthcare-associated  pneumonia)   . Sepsis due to undetermined organism ([place]) 12/02/2018  . Lobar pneumonia ([place]) 12/02/2018  . Acute respiratory failure with hypoxia ([place]) 12/02/2018  . Hydronephrosis   . Abnormal LFTs (liver function tests) 03/11/2018  . Type 2 diabetes mellitus ([place]) 03/11/2018  . Valvular heart disease 03/11/2018  . Chronic kidney disease on chronic dialysis ([place]) 11/13/2017  . Leukocytosis 09/21/2017  . Generalized weakness 09/21/2017  . Hyperlipidemia associated with type 2 diabetes mellitus ([place]) 02/22/2017  . ESRD (end stage renal disease) ([place]) 08-05-2016  . 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 Encounter for aftercare following kidney transplant 05/22/2016  . Chronic diastolic heart failure ([place]) 06/08/2015  . Aortic valve sclerosis 06/08/2015  . Anemia of chronic disease 07/03/2013  . Essential hypertension 12/18/2010    Past Surgical History:  Procedure Laterality Date  . A/V FISTULAGRAM N/A 04/05/2017   Procedure: A/V FISTULAGRAM - Left Arm;  Surgeon: Angelia Mould, MD;  Location: [place] [facility];  Service: Cardiovascular;  Laterality: N/A;  . A/V FISTULAGRAM Left 09/18/2017   Procedure: A/V FISTULAGRAM;  Surgeon: Serafina Mitchell, MD;  Location: [place] [facility];  Service: Cardiovascular;  Laterality: Left;  . A/V SHUNT INTERVENTION Left 04/05/2017   Procedure: A/V SHUNT INTERVENTION;  Surgeon: Angelia Mould, MD;  Location: [place] [facility];  Service: Cardiovascular;  Laterality: Left;  . CARDIAC CATHETERIZATION  2008  . IR  REMOVAL TUN CV CATH W/O FL  11/02/2016  . 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 PERIPHERAL VASCULAR BALLOON ANGIOPLASTY Left 09/18/2017   Procedure: PERIPHERAL VASCULAR BALLOON ANGIOPLASTY;  Surgeon: Serafina Mitchell, MD;  Location: [place] [facility];  Service: Cardiovascular;  Laterality: Left;  Arm Fistula     OB History   No obstetric history on file.      Home Medications    Prior to Admission  medications   Medication Sig Start Date End Date Taking? Authorizing Provider  acetaminophen (TYLENOL) 500 MG tablet Take 500 mg by mouth every 6 (six) hours as needed for moderate pain or headache.  04/06/16  Yes [provider]  amLODipine (NORVASC) 5 MG tablet Take 1 tablet by mouth nightly Patient taking differently: Take 5 mg by mouth at bedtime.  11/25/18  Yes Hawks, Christy A, FNP  aspirin (ASPIR-LOW) 81 MG EC tablet Take 81 mg by mouth daily.    Yes [provider]  B Complex-C-Folic Acid (RENA-VITE RX) 1 MG TABS Take 1 tablet by mouth daily. 09/24/18  Yes [provider]  insulin degludec (TRESIBA FLEXTOUCH) 100 UNIT/ML SOPN FlexTouch Pen Inject 0.1 mLs (10 Units total) into the skin daily at 10 pm. 11/05/18  Yes Hawks, Christy A, FNP  isosorbide mononitrate (IMDUR) 60 MG 24 hr tablet Take 1 tablet by mouth once daily Patient taking differently: Take 60 mg by mouth daily.  11/18/18  Yes Hawks, Christy A, FNP  lidocaine-prilocaine (EMLA) cream Apply 1 application topically as needed (for dialysis).  09/08/16  Yes [provider]  meclizine (ANTIVERT) 25 MG tablet Take 1 tablet (25 mg total) by mouth 2 (two) times daily as needed for dizziness. 11/26/18  Yes Idol, Almyra Free, PA-C  metoprolol tartrate (LOPRESSOR) 25 MG tablet Take 1/2 (one-half) tablet by mouth twice daily Patient taking differently: Take 12.5 mg by mouth 2 (two) times daily.  10/02/18  Yes Hawks, Christy A, FNP  ondansetron (ZOFRAN ODT) 4 MG disintegrating tablet 4mg  ODT q4 hours prn nausea/vomit Patient taking differently: Take 4 mg by mouth 2 (two) times daily. 4mg  ODT q4 hours prn nausea/vomit 11/26/17  Yes Milton Ferguson, MD  predniSONE (DELTASONE) 5 MG tablet TAKE 1 TABLET BY MOUTH ONCE DAILY WITH BREAKFAST 04/22/18  Yes Hawks, Christy A, FNP  PROGRAF 0.5 MG capsule Take 0.5-1 mg by mouth See admin instructions. Take 1 mg by mouth in the morning and take 0.5 mg by mouth at bedtime 10/24/16  Yes  [provider]  rosuvastatin (CRESTOR) 20 MG tablet Take 1 tablet by mouth once daily Patient taking differently: Take 20 mg by mouth daily.  10/28/18  Yes Hawks, Christy A, FNP  sevelamer carbonate (RENVELA) 800 MG tablet Take 800 mg by mouth See admin instructions. 4 tablets with meals and 2 tablets with snacks. 10/02/16  Yes [provider]  triamcinolone ointment (KENALOG) 0.5 % Apply 1 application topically 2 (two) times daily. 10/17/18  Yes Hawks, Christy A, FNP  TRULICITY 1.5 0000000 SOPN INJECT 1 DOSE (1.5 MG) SUBCUTANEOUSLY ONCE A WEEK Patient taking differently: Inject 1.5 mg into the skin once a week.  10/02/18  Yes Hawks, Christy A, FNP  albuterol (PROVENTIL HFA;VENTOLIN HFA) 108 (90 Base) MCG/ACT inhaler Inhale 1-2 puffs into the lungs every 6 (six) hours as needed for wheezing or shortness of breath. Patient not taking: Reported on 12/03/2018 05/05/18   Triplett, Tammy, PA-C  fluticasone (FLONASE) 50 MCG/ACT nasal spray Place 2 sprays into both nostrils daily. Patient  not taking: Reported on 12/03/2018 05/03/18   Sharion Balloon, FNP    Family History Family History  Problem Relation Age of Onset  . Kidney disease Mother   . Diabetes Mother   . Diabetes Father   . Heart disease Father     Social History Social History   Tobacco Use  . Smoking status: Never Smoker  . Smokeless tobacco: Never Used  Substance Use Topics  . Alcohol use: No  . Drug use: No     Allergies   Other and Tape   Review of Systems Review of Systems  Constitutional: Positive for fatigue. Negative for chills and fever.  HENT: Negative for congestion, rhinorrhea and sore throat.   Eyes: Negative for visual disturbance.  Respiratory: Positive for shortness of breath. Negative for cough.   Cardiovascular: Negative for chest pain and leg swelling.  Gastrointestinal: Negative for abdominal pain, diarrhea, nausea and vomiting.  Genitourinary: Negative for dysuria.  Musculoskeletal:  Negative for back pain and neck pain.  Skin: Negative for rash.  Neurological: Negative for dizziness, light-headedness and headaches.  Hematological: Does not bruise/bleed easily.  Psychiatric/Behavioral: Negative for confusion.     Physical Exam Updated Vital Signs BP (!) 129/47   Pulse 60   Temp 97.8 F (36.6 C) (Oral)   Resp 17   Ht 1.753 m (5\' 9" )   Wt 68 kg   SpO2 100%   BMI 22.15 kg/m   Physical Exam Vitals signs and nursing note reviewed.  Constitutional:      General: She is in acute distress.     Appearance: She is well-developed. She is ill-appearing.  HENT:     Head: Normocephalic and atraumatic.  Eyes:     Extraocular Movements: Extraocular movements intact.     Conjunctiva/sclera: Conjunctivae normal.     Pupils: Pupils are equal, round, and reactive to light.  Neck:     Musculoskeletal: Normal range of motion and neck supple.  Cardiovascular:     Rate and Rhythm: Normal rate and regular rhythm.     Heart sounds: Murmur present.  Pulmonary:     Effort: Respiratory distress present.     Breath sounds: Normal breath sounds. No stridor. No wheezing.  Abdominal:     Palpations: Abdomen is soft.     Tenderness: There is no abdominal tenderness.  Musculoskeletal: Normal range of motion.        General: No swelling.     Comments: AV fistula right upper extremity.  Skin:    General: Skin is warm and dry.  Neurological:     General: No focal deficit present.     Mental Status: She is alert and oriented to person, place, and time.      ED Treatments / Results  Labs (all labs ordered are listed, but only abnormal results are displayed) Labs Reviewed  COMPREHENSIVE METABOLIC PANEL - Abnormal; Notable for the following components:      Result Value   Glucose, Bld 112 (*)    BUN 39 (*)    Creatinine, Ser 6.72 (*)    Calcium 8.5 (*)    Albumin 3.1 (*)    AST 47 (*)    ALT 56 (*)    GFR calc non Af Amer 6 (*)    GFR calc Af Amer 7 (*)    All other  components within normal limits  CBC WITH DIFFERENTIAL/PLATELET - Abnormal; Notable for the following components:   WBC 12.2 (*)    RBC 2.81 (*)  Hemoglobin 8.8 (*)    HCT 28.6 (*)    MCV 101.8 (*)    Neutro Abs 10.2 (*)    All other components within normal limits  D-DIMER, QUANTITATIVE (NOT AT [facility]) - Abnormal; Notable for the following components:   D-Dimer, Quant 1.57 (*)    All other components within normal limits  LACTATE DEHYDROGENASE - Abnormal; Notable for the following components:   LDH 194 (*)    All other components within normal limits  FERRITIN - Abnormal; Notable for the following components:   Ferritin 1,064 (*)    All other components within normal limits  C-REACTIVE PROTEIN - Abnormal; Notable for the following components:   CRP 5.7 (*)    All other components within normal limits  SARS CORONAVIRUS 2 BY RT PCR (HOSPITAL ORDER, [facility])  CULTURE, BLOOD (ROUTINE X 2)  CULTURE, BLOOD (ROUTINE X 2)  MRSA PCR SCREENING  LACTIC ACID, PLASMA  LACTIC ACID, PLASMA  PROCALCITONIN  TRIGLYCERIDES  FIBRINOGEN  BRAIN NATRIURETIC PEPTIDE   Results for orders placed or performed during the hospital encounter of 12/15/18  SARS Coronavirus 2 by RT PCR (hospital order, performed in [place] hospital lab) Nasopharyngeal Nasopharyngeal Swab   Specimen: Nasopharyngeal Swab  Result Value Ref Range   SARS Coronavirus 2 NEGATIVE NEGATIVE  Blood Culture (routine x 2)   Specimen: Blood  Result Value Ref Range   Specimen Description      BLOOD RIGHT FOREARM BOTTLES DRAWN AEROBIC AND ANAEROBIC   Special Requests      Blood Culture adequate volume Performed at [facility], [address] [place], [place] [address]    Culture PENDING    Report Status PENDING   Blood Culture (routine x 2)   Specimen: Right Antecubital; Blood  Result Value Ref Range   Specimen Description      RIGHT ANTECUBITAL BOTTLES DRAWN AEROBIC AND ANAEROBIC   Special  Requests      Blood Culture adequate volume Performed at [facility], [address], [place], [place] [address]    Culture PENDING    Report Status PENDING   Comprehensive metabolic panel  Result Value Ref Range   Sodium 138 135 - 145 mmol/L   Potassium 3.9 3.5 - 5.1 mmol/L   Chloride 98 98 - 111 mmol/L   CO2 27 22 - 32 mmol/L   Glucose, Bld 112 (H) 70 - 99 mg/dL   BUN 39 (H) 8 - 23 mg/dL   Creatinine, Ser 6.72 (H) 0.44 - 1.00 mg/dL   Calcium 8.5 (L) 8.9 - 10.3 mg/dL   Total Protein 7.0 6.5 - 8.1 g/dL   Albumin 3.1 (L) 3.5 - 5.0 g/dL   AST 47 (H) 15 - 41 U/L   ALT 56 (H) 0 - 44 U/L   Alkaline Phosphatase 108 38 - 126 U/L   Total Bilirubin 0.7 0.3 - 1.2 mg/dL   GFR calc non Af Amer 6 (L) >60 mL/min   GFR calc Af Amer 7 (L) >60 mL/min   Anion gap 13 5 - 15  CBC with Differential/Platelet  Result Value Ref Range   WBC 12.2 (H) 4.0 - 10.5 K/uL   RBC 2.81 (L) 3.87 - 5.11 MIL/uL   Hemoglobin 8.8 (L) 12.0 - 15.0 g/dL   HCT 28.6 (L) 36.0 - 46.0 %   MCV 101.8 (H) 80.0 - 100.0 fL   MCH 31.3 26.0 - 34.0 pg   MCHC 30.8 30.0 - 36.0 g/dL  RDW 13.7 11.5 - 15.5 %   Platelets 221 150 - 400 K/uL   nRBC 0.0 0.0 - 0.2 %   Neutrophils Relative % 83 %   Neutro Abs 10.2 (H) 1.7 - 7.7 K/uL   Lymphocytes Relative 7 %   Lymphs Abs 0.9 0.7 - 4.0 K/uL   Monocytes Relative 5 %   Monocytes Absolute 0.6 0.1 - 1.0 K/uL   Eosinophils Relative 3 %   Eosinophils Absolute 0.4 0.0 - 0.5 K/uL   Basophils Relative 1 %   Basophils Absolute 0.1 0.0 - 0.1 K/uL   Immature Granulocytes 1 %   Abs Immature Granulocytes 0.07 0.00 - 0.07 K/uL  Lactic acid, plasma  Result Value Ref Range   Lactic Acid, Venous 1.8 0.5 - 1.9 mmol/L  Lactic acid, plasma  Result Value Ref Range   Lactic Acid, Venous 1.2 0.5 - 1.9 mmol/L  D-dimer, quantitative  Result Value Ref Range   D-Dimer, Quant 1.57 (H) 0.00 - 0.50 ug/mL-FEU  Procalcitonin  Result Value Ref Range   Procalcitonin 1.15 ng/mL  Lactate dehydrogenase   Result Value Ref Range   LDH 194 (H) 98 - 192 U/L  Ferritin  Result Value Ref Range   Ferritin 1,064 (H) 11 - 307 ng/mL  Triglycerides  Result Value Ref Range   Triglycerides 95 <150 mg/dL  Fibrinogen  Result Value Ref Range   Fibrinogen 467 210 - 475 mg/dL  C-reactive protein  Result Value Ref Range   CRP 5.7 (H) <1.0 mg/dL     EKG None   ED ECG REPORT   Date: 12/15/2018  Rate: 66  Rhythm: normal sinus rhythm  QRS Axis: indeterminate  Intervals: QT prolonged  ST/T Wave abnormalities: nonspecific ST/T changes  Conduction Disutrbances:left anterior fascicular block  Narrative Interpretation:   Old EKG Reviewed: changes noted   Compared to previous EKG patient showing T wave inversion inferiorly and laterally.    I have personally reviewed the EKG tracing and agree with the computerized printout as noted.   Radiology Dg Chest Port 1 View  Result Date: 12/15/2018 CLINICAL DATA:  Pt reports increasing sob starting around 2am. Last dialysis on Friday and wasn't feeling well then and almost passed out EXAM: PORTABLE CHEST 1 VIEW COMPARISON:  Chest radiograph 12/02/2018 FINDINGS: Stable cardiomediastinal contours with enlarged heart size. Aortic arch calcification. There are diffuse bilateral interstitial opacities which could represent atypical infection or edema. No pneumothorax or large pleural effusion. No acute finding in the visualized skeleton. IMPRESSION: Diffuse bilateral interstitial opacities which could represent atypical infection or edema. Electronically Signed   By: Audie Pinto M.D.   On: 12/15/2018 13:03    Procedures Procedures (including critical care time)  CRITICAL CARE Performed by: Fredia Sorrow Total critical care time: 30 minutes Critical care time was exclusive of separately billable procedures and treating other patients. Critical care was necessary to treat or prevent imminent or life-threatening deterioration. Critical care was time  spent personally by me on the following activities: development of treatment plan with patient and/or surrogate as well as nursing, discussions with consultants, evaluation of patient's response to treatment, examination of patient, obtaining history from patient or surrogate, ordering and performing treatments and interventions, ordering and review of laboratory studies, ordering and review of radiographic studies, pulse oximetry and re-evaluation of patient's condition.   Medications Ordered in ED Medications  Vancomycin (VANCOCIN) 1,250 mg in sodium chloride 0.9 % 250 mL IVPB (1,250 mg Intravenous New Bag/Given 12/15/18 1659)  vancomycin (VANCOCIN) IVPB  750 mg/150 ml premix (has no administration in time range)  ceFEPIme (MAXIPIME) 1 g in sodium chloride 0.9 % 100 mL IVPB (has no administration in time range)  ondansetron (ZOFRAN) injection 4 mg (4 mg Intravenous Given 12/15/18 1257)  ceFEPIme (MAXIPIME) 1 g in sodium chloride 0.9 % 100 mL IVPB (1 g Intravenous New Bag/Given 12/15/18 1702)     Initial Impression / Assessment and Plan / ED Course  I have reviewed the triage vital signs and the nursing notes.  Pertinent labs & imaging results that were available during my care of the patient were reviewed by me and considered in my medical decision making (see chart for details).       Patient clearly with hypoxia improved with 4 L of oxygen.  Within patient had a drop in her oxygen sats.  Patient started on 100% nonrebreather.  With improvement in her oxygen sats.  Chest x-ray consistent with atypical pneumonia.  Patient's COVID testing was negative.  Labs consistent with end-stage renal disease.  Had a little bit of a leukocytosis.  EKG with sinus.  For the atypical pneumonia since patient was admitted in September and she is dialysis patient she was started on H CAP pneumonia antibiotics.  Patient kept on the 100% nonrebreather.  Patient comfortable on that.  Markedly improved.  Breathing  much less stressed.  The plan was to start the patient on BiPAP once COVID was negative but she improved and felt much more comfortable her percent nonrebreather so this was not started.   As part of the [place] lab testing patient had a d-dimer done which was elevated greater than 1.  Patient certainly with a big change in hypoxia could represent a pulmonary embolus.  So CT angios was ordered.  Discussed with hospitalist they will admit.  Final Clinical Impressions(s) / ED Diagnoses   Final diagnoses:  Atypical pneumonia  Hypoxia  ESRD (end stage renal disease) ([place])  Elevated d-dimer    ED Discharge Orders    None       Fredia Sorrow, MD 12/15/18 1740

## 2018-12-15 NOTE — ED Notes (Signed)
Okay for pt to eat per Dr [name]- pt given meal tray and beverage

## 2018-12-15 NOTE — H&P (Addendum)
History and Physical    [name] [id number] DOB: [birth year] DOA: 12/15/2018  PCP: Sharion Balloon, FNP   Patient coming from: Home  I have personally briefly reviewed patient's old medical records in [place]  Chief Complaint: SOB  HPI: [name] is a 63 y.o. female with medical history significant for  ESRD, diastolic congestive heart failure and pulmonary hypertension, coronary artery disease, hypertension,, diabetes mellitus.  Patient presented to the ED with complaints of difficulty breathing that started at about 2 AM today.  Patient recorded her O2 sats with a home O2 device and it was 74% on room air.  She denies any other respiratory symptoms, no cough, no chest pain.  She has chronic stable mild lower extremity swelling that is unchanged.  She reports compliance with fluid and low-salt intake.  She is on dialysis Monday Wednesday Friday, her last HD session was Friday.  Recent hospitalization 9/28-9/30-sepsis secondary to pneumonia with acute respiratory failure, treated with broad-spectrum antibiotics.  ED Course: O2 sats 86% on room air, improved with 2 L nasal cannula to 95- 100%.  WBC 12.2.  Mild elevated liver enzymes AST 47, ALT 56.  Lactic acid 1.8.  Portable chest x-ray-diffuse bilateral interstitial opacities-atypical infection or edema.  COVID-19 test was negative.  D-dimer done as part of COVID-19 work-up/rule out was elevated at 1.57.  CTA chest was pending.  IV vancomycin and cefepime started for possible HCAP.   Review of Systems: As per HPI all other systems reviewed and negative.  Past Medical History:  Diagnosis Date   Anemia    of chronic disease   Blood transfusion without reported diagnosis    Cardiovascular disease    nonobstructive   Carotid artery stenosis 2008   CHF (congestive heart failure) (HCC)    Coronary artery disease    Diabetes mellitus    Dyslipidemia    Edema of lower extremity    with hypo-albuminemia and  profound protenuria   Heart murmur    Hypertension    Mitral regurgitation    Nephrotic syndrome    Patent foramen ovale    Pulmonary hypertension, moderate to severe (HCC)    Pulmonary nodule    Tricuspid regurgitation    Volume depletion, renal, due to output loss (renal deficit)     Past Surgical History:  Procedure Laterality Date   A/V FISTULAGRAM N/A 04/05/2017   Procedure: A/V FISTULAGRAM - Left Arm;  Surgeon: Angelia Mould, MD;  Location: [facility];  Service: Cardiovascular;  Laterality: N/A;   A/V FISTULAGRAM Left 09/18/2017   Procedure: A/V FISTULAGRAM;  Surgeon: Serafina Mitchell, MD;  Location: [place] [facility];  Service: Cardiovascular;  Laterality: Left;   A/V SHUNT INTERVENTION Left 04/05/2017   Procedure: A/V SHUNT INTERVENTION;  Surgeon: Angelia Mould, MD;  Location: [facility];  Service: Cardiovascular;  Laterality: Left;   CARDIAC CATHETERIZATION  2008   IR REMOVAL TUN CV CATH W/O FL  11/02/2016   KIDNEY TRANSPLANT Right 02/23/2009   PERIPHERAL VASCULAR BALLOON ANGIOPLASTY Left 09/18/2017   Procedure: PERIPHERAL VASCULAR BALLOON ANGIOPLASTY;  Surgeon: Serafina Mitchell, MD;  Location: [facility];  Service: Cardiovascular;  Laterality: Left;  Arm F[place]     reports that she has never smoked. She has never used smokeless tobacco. She reports that she does not drink alcohol or use drugs.  Allergies  Allergen Reactions   Other Other (See Comments)    Tape Bruises and tears  skin, Paper tape tolerated   Tape Other (See Comments)    Bruises and tears skin Paper tape tolerated Bruises and tears skin Paper tape tolerated Bruises and tears skin Paper tape tolerated    Family History  Problem Relation Age of Onset   Kidney disease Mother    Diabetes Mother    Diabetes Father    Heart disease Father     Prior to Admission medications   Medication Sig Start Date End Date Taking? Authorizing Provider    acetaminophen (TYLENOL) 500 MG tablet Take 500 mg by mouth every 6 (six) hours as needed for moderate pain or headache.  04/06/16  Yes [provider]  amLODipine (NORVASC) 5 MG tablet Take 1 tablet by mouth nightly Patient taking differently: Take 5 mg by mouth at bedtime.  11/25/18  Yes Hawks, Christy A, FNP  aspirin (ASPIR-LOW) 81 MG EC tablet Take 81 mg by mouth daily.    Yes [provider]  B Complex-C-Folic Acid (RENA-VITE RX) 1 MG TABS Take 1 tablet by mouth daily. 09/24/18  Yes [provider]  insulin degludec (TRESIBA FLEXTOUCH) 100 UNIT/ML SOPN FlexTouch Pen Inject 0.1 mLs (10 Units total) into the skin daily at 10 pm. 11/05/18  Yes Hawks, Christy A, FNP  isosorbide mononitrate (IMDUR) 60 MG 24 hr tablet Take 1 tablet by mouth once daily Patient taking differently: Take 60 mg by mouth daily.  11/18/18  Yes Hawks, Christy A, FNP  lidocaine-prilocaine (EMLA) cream Apply 1 application topically as needed (for dialysis).  09/08/16  Yes [provider]  meclizine (ANTIVERT) 25 MG tablet Take 1 tablet (25 mg total) by mouth 2 (two) times daily as needed for dizziness. 11/26/18  Yes Idol, Almyra Free, PA-C  metoprolol tartrate (LOPRESSOR) 25 MG tablet Take 1/2 (one-half) tablet by mouth twice daily Patient taking differently: Take 12.5 mg by mouth 2 (two) times daily.  10/02/18  Yes Hawks, Christy A, FNP  ondansetron (ZOFRAN ODT) 4 MG disintegrating tablet 4mg  ODT q4 hours prn nausea/vomit Patient taking differently: Take 4 mg by mouth 2 (two) times daily. 4mg  ODT q4 hours prn nausea/vomit 11/26/17  Yes Milton Ferguson, MD  predniSONE (DELTASONE) 5 MG tablet TAKE 1 TABLET BY MOUTH ONCE DAILY WITH BREAKFAST 04/22/18  Yes Hawks, Christy A, FNP  PROGRAF 0.5 MG capsule Take 0.5-1 mg by mouth See admin instructions. Take 1 mg by mouth in the morning and take 0.5 mg by mouth at bedtime 10/24/16  Yes [provider]  rosuvastatin (CRESTOR) 20 MG tablet Take 1 tablet by mouth  once daily Patient taking differently: Take 20 mg by mouth daily.  10/28/18  Yes Hawks, Christy A, FNP  sevelamer carbonate (RENVELA) 800 MG tablet Take 800 mg by mouth See admin instructions. 4 tablets with meals and 2 tablets with snacks. 10/02/16  Yes [provider]  triamcinolone ointment (KENALOG) 0.5 % Apply 1 application topically 2 (two) times daily. 10/17/18  Yes Hawks, Christy A, FNP  TRULICITY 1.5 0000000 SOPN INJECT 1 DOSE (1.5 MG) SUBCUTANEOUSLY ONCE A WEEK Patient taking differently: Inject 1.5 mg into the skin once a week.  10/02/18  Yes Hawks, Christy A, FNP  albuterol (PROVENTIL HFA;VENTOLIN HFA) 108 (90 Base) MCG/ACT inhaler Inhale 1-2 puffs into the lungs every 6 (six) hours as needed for wheezing or shortness of breath. Patient not taking: Reported on 12/03/2018 05/05/18   Triplett, Tammy, PA-C  fluticasone (FLONASE) 50 MCG/ACT nasal spray Place 2 sprays into both nostrils daily. Patient not taking:  Reported on 12/03/2018 05/03/18   Sharion Balloon, FNP    Physical Exam: Vitals:   12/15/18 1830 12/15/18 1930 12/15/18 2000 12/15/18 2100  BP: (!) 112/46 (!) 116/47 (!) 110/45 (!) 115/50  Pulse: 63 64 65 62  Resp: 20 20 17 20   Temp:      TempSrc:      SpO2: 100% 100% 100% 100%  Weight:      Height:        Constitutional: NAD, calm, comfortable Vitals:   12/15/18 1830 12/15/18 1930 12/15/18 2000 12/15/18 2100  BP: (!) 112/46 (!) 116/47 (!) 110/45 (!) 115/50  Pulse: 63 64 65 62  Resp: 20 20 17 20   Temp:      TempSrc:      SpO2: 100% 100% 100% 100%  Weight:      Height:       Eyes: PERRL, lids and conjunctivae normal ENMT: Mucous membranes are moist. Posterior pharynx clear of any exudate or lesions.  Neck: normal, supple, no masses, no thyromegaly Respiratory: Crackles R >> L base of lungs, clear to auscultation bilaterally, Normal respiratory effort. No accessory muscle use.  Cardiovascular: Regular rate and rhythm, no murmurs / rubs / gallops. Trace bilat  pitting pedal edema. 2+ pedal pulses. No carotid bruits.  Abdomen: no tenderness, no masses palpated. No hepatosplenomegaly. Bowel sounds positive.  Musculoskeletal: no clubbing / cyanosis. No joint deformity upper and lower extremities. Good ROM, no contractures. Normal muscle tone.  Skin: no rashes, lesions, ulcers. No induration Neurologic: CN 2-12 grossly intact. Strength 5/5 in all 4.  Psychiatric: Normal judgment and insight. Alert and oriented x 3. Normal mood.   Labs on Admission: I have personally reviewed following labs and imaging studies  CBC: Recent Labs  Lab 12/15/18 1229  WBC 12.2*  NEUTROABS 10.2*  HGB 8.8*  HCT 28.6*  MCV 101.8*  PLT [id number]   Basic Metabolic Panel: Recent Labs  Lab 12/15/18 1229  NA 138  K 3.9  CL 98  CO2 27  GLUCOSE 112*  BUN 39*  CREATININE 6.72*  CALCIUM 8.5*   Liver Function Tests: Recent Labs  Lab 12/15/18 1229  AST 47*  ALT 56*  ALKPHOS 108  BILITOT 0.7  PROT 7.0  ALBUMIN 3.1*   Lipid Profile: Recent Labs    12/15/18 1229  TRIG 95   Anemia Panel: Recent Labs    12/15/18 1229  FERRITIN 1,064*   Urine analysis:    Component Value Date/Time   COLORURINE YELLOW 12/02/2018 0622   APPEARANCEUR HAZY (A) 12/02/2018 0622   APPEARANCEUR Cloudy (A) 11/25/2018 1202   LABSPEC 1.013 12/02/2018 0622   PHURINE 7.0 12/02/2018 0622   GLUCOSEU 150 (A) 12/02/2018 0622   HGBUR SMALL (A) 12/02/2018 0622   BILIRUBINUR NEGATIVE 12/02/2018 0622   BILIRUBINUR Negative 11/25/2018 1202   KETONESUR NEGATIVE 12/02/2018 0622   PROTEINUR 100 (A) 12/02/2018 0622   NITRITE NEGATIVE 12/02/2018 0622   LEUKOCYTESUR LARGE (A) 12/02/2018 0622    Radiological Exams on Admission: Ct Angio Chest Pe W/cm &/or Wo Cm  Result Date: 12/15/2018 CLINICAL DATA:  Acute shortness of breath and hypoxia. Dialysis patient. COVID-19 negative. EXAM: CT ANGIOGRAPHY CHEST WITH CONTRAST TECHNIQUE: Multidetector CT imaging of the chest was performed using the  standard protocol during bolus administration of intravenous contrast. Multiplanar CT image reconstructions and MIPs were obtained to evaluate the vascular anatomy. CONTRAST:  144mL OMNIPAQUE IOHEXOL 350 MG/ML SOLN COMPARISON:  Chest x-ray from same day. CT chest report dated September 04, 2006. FINDINGS: Cardiovascular: Satisfactory opacification of the pulmonary arteries to the segmental level. No evidence of pulmonary embolism. Mild cardiomegaly. No pericardial effusion. No thoracic aortic aneurysm or dissection. Coronary, aortic arch, and branch vessel atherosclerotic vascular disease. Mediastinum/Nodes: Mildly enlarged subcarinal node measuring 1.3 cm in short axis, likely reactive. No additional enlarged mediastinal, hilar, or axillary lymph nodes. The thyroid gland, trachea, and esophagus demonstrate no significant findings. Lungs/Pleura: Small to moderate bilateral pleural effusions. Complete collapse of the left lower lobe. Partial collapse of the right lower lobe. Diffuse interlobular septal thickening throughout both lungs. Small area of peribronchovascular ground-glass density in the central right upper lobe. Mild subsegmental atelectasis in the dependent left upper and right middle lobes. No consolidation or pneumothorax. No suspicious pulmonary nodule. Upper Abdomen: No acute abnormality. Musculoskeletal: No chest wall abnormality. No acute or significant osseous findings. Multiple chronic mild thoracic compression deformities. Review of the MIP images confirms the above findings. IMPRESSION: 1.  No evidence of pulmonary embolism. 2. Findings consistent with congestive heart failure. Small to moderate bilateral pleural effusions with diffuse interstitial pulmonary edema. Small area of alveolar pulmonary edema in the central right upper lobe. 3. Complete collapse of the left lower lobe. Partial collapse of the right lower lobe. 4.  Aortic atherosclerosis (ICD10-I70.0). Electronically Signed   By: Titus Dubin M.D.   On: 12/15/2018 19:23   Dg Chest Port 1 View  Result Date: 12/15/2018 CLINICAL DATA:  Pt reports increasing sob starting around 2am. Last dialysis on Friday and wasn't feeling well then and almost passed out EXAM: PORTABLE CHEST 1 VIEW COMPARISON:  Chest radiograph 12/02/2018 FINDINGS: Stable cardiomediastinal contours with enlarged heart size. Aortic arch calcification. There are diffuse bilateral interstitial opacities which could represent atypical infection or edema. No pneumothorax or large pleural effusion. No acute finding in the visualized skeleton. IMPRESSION: Diffuse bilateral interstitial opacities which could represent atypical infection or edema. Electronically Signed   By: Audie Pinto M.D.   On: 12/15/2018 13:03    EKG: Sinus rhythm, diffuse T wave abnormalities which are new compared to prior EKG.  QTC prolonged at 573.  Assessment/Plan Active Problems:   Dyspnea   Lung collapse  Lung collapse-CTA chest shows findings consistent with CHF, small to moderate bilateral pleural effusions and diffuse pulmonary edema, complete collapse of left lower lobe, partial collapse of right lower lobe. - Currently on 2 L supplemental O2, continue -Please consult pulmonology in a.m., patient may benefit from bronchoscopy, rule out intraluminal obstruction, mucous plugging  Decompensated CHF with acute respiratory failure -CT chest with pleural effusions and pulmonary edema.  BNP elevated at > 4,500 (no prior on file to compare), in the setting of ESRD.  -Discontinue antibiotics -Fluid status per HD  ESRD-Monday Wednesday Friday.  Last HD Friday.  History of failed renal transplant. -Please consult nephrology in a.m. -Resume home Prograf and low-dose 5 mg prednisone daily -Resume home renal medications  Diabetes mellitus-random glucose 112. -Hold home [place], once weekly Trulicity for now - SSI  History of coronary artery disease-she denies chest pain.  EKG shows  diffuse T wave changes- ?  If due to volume overload. - Hs troponin x 2- but likely elevated in the setting of ESRD. -Resume home statin, Imdur and metoprolol -Hold aspirin for now in case bronchoscopy needed -Repeat EKG in a.m.  Prolonged QTC-573.  Potassium 3.9.  Not on QT prolonging agents. -Check magnesium -Repeat EKG in a.m.  Mild elevated liver enzymes-AST 47, ALT 56. ?  Congestive  hepatopathy. -Acute hepatitis panel -CMP a.m.  Hypertension-stable. -Resume home Imdur, metoprolol, Norvasc in a.m.   DVT prophylaxis: SCDs Code Status: Full code Family Communication: Spouse at bedside  disposition Plan: Per rounding team Consults called: Pls consult Pulmonology and nephrology in the morning, on arrival to [facility]. Admission status: Inpt, tele I certify that at the point of admission it is my clinical judgment that the patient will require inpatient hospital care spanning beyond 2 midnights from the point of admission due to high intensity of service, high risk for further deterioration and high frequency of surveillance required. The following factors support the patient status of inpatient: Acute hypoxic respiratory failure requiring supplemental O2 with findings of lung collapse which may require bronchoscopy while inpatient.   Bethena Roys MD [facility]  12/15/2018, 10:19 PM

## 2018-12-15 NOTE — Progress Notes (Signed)
Pharmacy Antibiotic Note  [name] is a 62 y.o. female admitted on 12/15/2018 with pneumonia.  Pharmacy has been consulted for cefepime and vancomycin dosing.  Plan:vancomycin 1.25gm iv x1, vancomycin 750mg  given in last hour of HD QMWF cefepime 1gm iv q24h  Height: 5\' 9"  (175.3 cm) Weight: 150 lb (68 kg) IBW/kg (Calculated) : 66.2  Temp (24hrs), Avg:97.8 F (36.6 C), Min:97.8 F (36.6 C), Max:97.8 F (36.6 C)  Recent Labs  Lab 12/15/18 1229  WBC 12.2*  CREATININE 6.72*  LATICACIDVEN 1.8    Estimated Creatinine Clearance: 8.8 mL/min (A) (by C-G formula based on SCr of 6.72 mg/dL (H)).    Allergies  Allergen Reactions  . Other Other (See Comments)    Tape Bruises and tears skin, Paper tape tolerated  . Tape Other (See Comments)    Bruises and tears skin Paper tape tolerated Bruises and tears skin Paper tape tolerated Bruises and tears skin Paper tape tolerated    Antimicrobials this admission: 10/11 cefepime >>  10/11 vancomycin >>   Microbiology results: 10/11 BCx: sent 10/11 MRSA PCR: sent 10/11 Covid 19: negative   Thank you for allowing pharmacy to be a part of this patient's care.  [name] 12/15/2018 4:24 PM

## 2018-12-15 NOTE — ED Notes (Signed)
Pt 83% o2 sat on o2 3liters.  EDP at bedside, pt placed on 100% NRB and o2 sat 100%.

## 2018-12-16 ENCOUNTER — Other Ambulatory Visit: Payer: Self-pay

## 2018-12-16 DIAGNOSIS — J9601 Acute respiratory failure with hypoxia: Secondary | ICD-10-CM

## 2018-12-16 DIAGNOSIS — J9819 Other pulmonary collapse: Secondary | ICD-10-CM

## 2018-12-16 HISTORY — DX: Acute respiratory failure with hypoxia: J96.01

## 2018-12-16 LAB — CBG MONITORING, ED
Glucose-Capillary: 116 mg/dL — ABNORMAL HIGH (ref 70–99)
Glucose-Capillary: 88 mg/dL (ref 70–99)
Glucose-Capillary: 97 mg/dL (ref 70–99)

## 2018-12-16 LAB — COMPREHENSIVE METABOLIC PANEL
ALT: 45 U/L — ABNORMAL HIGH (ref 0–44)
AST: 37 U/L (ref 15–41)
Albumin: 2.8 g/dL — ABNORMAL LOW (ref 3.5–5.0)
Alkaline Phosphatase: 96 U/L (ref 38–126)
Anion gap: 10 (ref 5–15)
BUN: 41 mg/dL — ABNORMAL HIGH (ref 8–23)
CO2: 28 mmol/L (ref 22–32)
Calcium: 8.1 mg/dL — ABNORMAL LOW (ref 8.9–10.3)
Chloride: 99 mmol/L (ref 98–111)
Creatinine, Ser: 7.41 mg/dL — ABNORMAL HIGH (ref 0.44–1.00)
GFR calc Af Amer: 6 mL/min — ABNORMAL LOW (ref >60)
GFR calc non Af Amer: 5 mL/min — ABNORMAL LOW (ref >60)
Glucose, Bld: 107 mg/dL — ABNORMAL HIGH (ref 70–99)
Potassium: 4.9 mmol/L (ref 3.5–5.1)
Sodium: 137 mmol/L (ref 135–145)
Total Bilirubin: 0.8 mg/dL (ref 0.3–1.2)
Total Protein: 6.5 g/dL (ref 6.5–8.1)

## 2018-12-16 LAB — GLUCOSE, CAPILLARY
Glucose-Capillary: 170 mg/dL — ABNORMAL HIGH (ref 70–99)
Glucose-Capillary: 213 mg/dL — ABNORMAL HIGH (ref 70–99)
Glucose-Capillary: 289 mg/dL — ABNORMAL HIGH (ref 70–99)
Glucose-Capillary: 310 mg/dL — ABNORMAL HIGH (ref 70–99)

## 2018-12-16 LAB — HEPATITIS PANEL, ACUTE
HCV Ab: NONREACTIVE
Hep A IgM: NONREACTIVE
Hep B C IgM: NONREACTIVE
Hepatitis B Surface Ag: NONREACTIVE

## 2018-12-16 LAB — TROPONIN I (HIGH SENSITIVITY)
Troponin I (High Sensitivity): 89 ng/L — ABNORMAL HIGH (ref <18)
Troponin I (High Sensitivity): 94 ng/L — ABNORMAL HIGH (ref <18)

## 2018-12-16 LAB — CBC
HCT: 30 % — ABNORMAL LOW (ref 36.0–46.0)
Hemoglobin: 9.1 g/dL — ABNORMAL LOW (ref 12.0–15.0)
MCH: 31.7 pg (ref 26.0–34.0)
MCHC: 30.3 g/dL (ref 30.0–36.0)
MCV: 104.5 fL — ABNORMAL HIGH (ref 80.0–100.0)
Platelets: 160 10*3/uL (ref 150–400)
RBC: 2.87 MIL/uL — ABNORMAL LOW (ref 3.87–5.11)
RDW: 13.8 % (ref 11.5–15.5)
WBC: 10.7 10*3/uL — ABNORMAL HIGH (ref 4.0–10.5)
nRBC: 0 % (ref 0.0–0.2)

## 2018-12-16 LAB — MAGNESIUM: Magnesium: 2.2 mg/dL (ref 1.7–2.4)

## 2018-12-16 MED ORDER — AMLODIPINE BESYLATE 5 MG PO TABS: 5.0000 mg | ORAL_TABLET | Freq: Every day | ORAL | Status: DC

## 2018-12-16 MED ORDER — PIPERACILLIN-TAZOBACTAM 3.375 G IVPB
3.3750 g | Freq: Two times a day (BID) | INTRAVENOUS | Status: DC
  Administered 2018-12-16 – 2018-12-19 (×6): 3.375 g via INTRAVENOUS
  Filled 2018-12-16 (×6): qty 50

## 2018-12-16 MED ORDER — ASPIRIN EC 81 MG PO TBEC: 81.0000 mg | DELAYED_RELEASE_TABLET | Freq: Every day | ORAL | Status: DC

## 2018-12-16 MED ORDER — METOPROLOL TARTRATE 25 MG PO TABS
12.5000 mg | ORAL_TABLET | Freq: Two times a day (BID) | ORAL | Status: DC
  Administered 2018-12-16 – 2018-12-19 (×5): 12.5 mg via ORAL
  Filled 2018-12-16 (×5): qty 1

## 2018-12-16 MED ORDER — SODIUM CHLORIDE 0.9 % IV SOLN: 100.0000 mL | INTRAVENOUS | Status: DC | PRN

## 2018-12-16 MED ORDER — ACETAMINOPHEN 325 MG PO TABS
650.0000 mg | ORAL_TABLET | Freq: Four times a day (QID) | ORAL | Status: DC | PRN
  Administered 2018-12-18 (×2): 650 mg via ORAL
  Filled 2018-12-16 (×2): qty 2

## 2018-12-16 MED ORDER — LIDOCAINE-PRILOCAINE 2.5-2.5 % EX CREA: 1.0000 "application " | TOPICAL_CREAM | CUTANEOUS | Status: DC | PRN

## 2018-12-16 MED ORDER — ACETYLCYSTEINE 20 % IN SOLN
3.0000 mL | RESPIRATORY_TRACT | Status: DC
  Administered 2018-12-16 – 2018-12-17 (×3): 3 mL via RESPIRATORY_TRACT
  Filled 2018-12-16 (×3): qty 4

## 2018-12-16 MED ORDER — ALBUMIN HUMAN 25 % IV SOLN
25.0000 g | Freq: Once | INTRAVENOUS | Status: AC
  Administered 2018-12-16: 25 g via INTRAVENOUS
  Filled 2018-12-16: qty 100

## 2018-12-16 MED ORDER — GUAIFENESIN ER 600 MG PO TB12
1200.0000 mg | ORAL_TABLET | Freq: Two times a day (BID) | ORAL | Status: DC
  Administered 2018-12-16 – 2018-12-19 (×7): 1200 mg via ORAL
  Filled 2018-12-16 (×7): qty 2

## 2018-12-16 MED ORDER — ACETAMINOPHEN 650 MG RE SUPP: 650.0000 mg | Freq: Four times a day (QID) | RECTAL | Status: DC | PRN

## 2018-12-16 MED ORDER — LIDOCAINE HCL (PF) 1 % IJ SOLN: 5.0000 mL | INTRAMUSCULAR | Status: DC | PRN

## 2018-12-16 MED ORDER — PIPERACILLIN-TAZOBACTAM 3.375 G IVPB 30 MIN
3.3750 g | Freq: Once | INTRAVENOUS | Status: AC
  Administered 2018-12-16: 3.375 g via INTRAVENOUS
  Filled 2018-12-16: qty 50

## 2018-12-16 MED ORDER — IPRATROPIUM-ALBUTEROL 0.5-2.5 (3) MG/3ML IN SOLN
3.0000 mL | RESPIRATORY_TRACT | Status: DC
  Administered 2018-12-16 – 2018-12-17 (×6): 3 mL via RESPIRATORY_TRACT
  Filled 2018-12-16 (×7): qty 3

## 2018-12-16 MED ORDER — HEPARIN SODIUM (PORCINE) 1000 UNIT/ML DIALYSIS
40.0000 [IU]/kg | INTRAMUSCULAR | Status: DC | PRN
  Filled 2018-12-16: qty 3

## 2018-12-16 MED ORDER — ROSUVASTATIN CALCIUM 20 MG PO TABS: 20.0000 mg | ORAL_TABLET | Freq: Every day | ORAL | Status: DC

## 2018-12-16 MED ORDER — SEVELAMER CARBONATE 800 MG PO TABS: 1600.0000 mg | ORAL_TABLET | Freq: Two times a day (BID) | ORAL | Status: DC | PRN

## 2018-12-16 MED ORDER — POLYETHYLENE GLYCOL 3350 17 G PO PACK: 17.0000 g | PACK | Freq: Every day | ORAL | Status: DC | PRN

## 2018-12-16 MED ORDER — PENTAFLUOROPROP-TETRAFLUOROETH EX AERO: 1.0000 "application " | INHALATION_SPRAY | CUTANEOUS | Status: DC | PRN

## 2018-12-16 MED ORDER — ACETYLCYSTEINE 20 % IN SOLN
3.0000 mL | RESPIRATORY_TRACT | Status: DC
  Administered 2018-12-16: 4 mL via RESPIRATORY_TRACT
  Administered 2018-12-16 (×2): 3 mL via RESPIRATORY_TRACT
  Filled 2018-12-16 (×3): qty 4

## 2018-12-16 MED ORDER — ISOSORBIDE MONONITRATE ER 60 MG PO TB24: 60.0000 mg | ORAL_TABLET | Freq: Every day | ORAL | Status: DC

## 2018-12-16 MED ORDER — ONDANSETRON HCL 4 MG PO TABS: 4.0000 mg | ORAL_TABLET | Freq: Four times a day (QID) | ORAL | Status: DC | PRN

## 2018-12-16 MED ORDER — METHYLPREDNISOLONE SODIUM SUCC 40 MG IJ SOLR
40.0000 mg | Freq: Two times a day (BID) | INTRAMUSCULAR | Status: DC
  Administered 2018-12-16 (×2): 40 mg via INTRAVENOUS
  Filled 2018-12-16 (×2): qty 1

## 2018-12-16 MED ORDER — SEVELAMER CARBONATE 800 MG PO TABS
3200.0000 mg | ORAL_TABLET | Freq: Three times a day (TID) | ORAL | Status: DC
  Administered 2018-12-16 – 2018-12-18 (×7): 3200 mg via ORAL
  Administered 2018-12-19: 1600 mg via ORAL
  Administered 2018-12-19: 3200 mg via ORAL
  Filled 2018-12-16 (×9): qty 4

## 2018-12-16 MED ORDER — ALBUTEROL SULFATE HFA 108 (90 BASE) MCG/ACT IN AERS: 1.0000 | INHALATION_SPRAY | Freq: Four times a day (QID) | RESPIRATORY_TRACT | Status: DC | PRN

## 2018-12-16 MED ORDER — TACROLIMUS 0.5 MG PO CAPS
0.5000 mg | ORAL_CAPSULE | Freq: Every morning | ORAL | Status: DC
  Administered 2018-12-17 – 2018-12-19 (×3): 0.5 mg via ORAL
  Filled 2018-12-16 (×8): qty 1

## 2018-12-16 MED ORDER — MECLIZINE HCL 12.5 MG PO TABS: 25.0000 mg | ORAL_TABLET | Freq: Two times a day (BID) | ORAL | Status: DC | PRN

## 2018-12-16 MED ORDER — ONDANSETRON HCL 4 MG/2ML IJ SOLN
4.0000 mg | Freq: Four times a day (QID) | INTRAMUSCULAR | Status: DC | PRN
  Administered 2018-12-16: 4 mg via INTRAVENOUS
  Filled 2018-12-16: qty 2

## 2018-12-16 MED ORDER — PREDNISONE 10 MG PO TABS: 5.0000 mg | ORAL_TABLET | Freq: Every day | ORAL | Status: DC

## 2018-12-16 MED ORDER — CHLORHEXIDINE GLUCONATE CLOTH 2 % EX PADS
6.0000 | MEDICATED_PAD | Freq: Every day | CUTANEOUS | Status: DC
  Administered 2018-12-17 – 2018-12-19 (×3): 6 via TOPICAL

## 2018-12-16 MED ORDER — INSULIN ASPART 100 UNIT/ML ~~LOC~~ SOLN
0.0000 [IU] | SUBCUTANEOUS | Status: DC
  Administered 2018-12-16: 2 [IU] via SUBCUTANEOUS
  Administered 2018-12-16: 3 [IU] via SUBCUTANEOUS
  Administered 2018-12-16: 7 [IU] via SUBCUTANEOUS
  Administered 2018-12-17 (×2): 5 [IU] via SUBCUTANEOUS

## 2018-12-16 NOTE — Progress Notes (Addendum)
PROGRESS NOTE    [name]  [id number] DOB: Jul 21, 1955 DOA: 12/15/2018 PCP: Sharion Balloon, FNP   Brief Narrative:  Per HPI: [name] is a 63 y.o. female with medical history significant for  ESRD, diastolic congestive heart failure and pulmonary hypertension, coronary artery disease, hypertension,, diabetes mellitus.  Patient presented to the ED with complaints of difficulty breathing that started at about 2 AM today.  Patient recorded her O2 sats with a home O2 device and it was 74% on room air.  She denies any other respiratory symptoms, no cough, no chest pain.  She has chronic stable mild lower extremity swelling that is unchanged.  She reports compliance with fluid and low-salt intake.  She is on dialysis Monday Wednesday Friday, her last HD session was Friday.  Recent hospitalization 9/28-9/30-sepsis secondary to pneumonia with acute respiratory failure, treated with broad-spectrum antibiotics.  10/12: Patient is noted to have some findings of lung collapse with suspected aspiration for which we will continue patient on Zosyn for treatment.  Appreciate pulmonology evaluation and will start on Mucomyst due to some mucus plugging associated with aspiration event that may have led to lung collapse.  She also appears to be volume overloaded and will receive hemodialysis today with nephrology.  Assessment & Plan:   Active Problems:   Dyspnea   Lung collapse   Acute hypoxemic respiratory failure (HCC)   Acute hypoxemic respiratory failure related to aspiration pneumonia with likely mucous plugging and lung collapse -Appreciate pulmonology evaluation with mucolytic's added -Maintain on IV Zosyn for aspiration coverage -May require bronchoscopy -Stepdown unit monitoring -Continue IV Solu-Medrol and breathing treatments  ESRD on HD MWF with volume overload -Appreciate nephrology evaluation and will undergo hemodialysis today  Anemia of chronic disease -Continue  monitor repeat CBC with no overt bleeding noted  Diabetes mellitus-controlled blood glucose -Hold home Tresiba -Maintain on SSI  History of CAD -Hold statin with elevated liver enzymes for now -Resume home aspirin  Hypertension-soft blood pressures -Monitor closely with hemodialysis -Blood pressures are currently soft and will hold antihypertensives with the exception of metoprolol  Elevated liver enzymes-downtrending -Could be related to congestive hepatopathy -Acute hepatitis panel ordered -We will follow CMP in a.m.  Prolonged QTC -Magnesium 2.2 -Monitor on telemetry   DVT prophylaxis: SCDs Code Status: Full code Family Communication: We will plan update spouse Disposition Plan: Inpatient, stepdown unit   Consultants:   Pulmonology  Nephrology  Procedures:   None  Antimicrobials:  Anti-infectives (From admission, onward)   Start     Dose/Rate Route Frequency Ordered Stop   12/16/18 2300  piperacillin-tazobactam (ZOSYN) IVPB 3.375 g     3.375 g 12.5 mL/hr over 240 Minutes Intravenous Every 12 hours 12/16/18 1009     12/16/18 1630  ceFEPIme (MAXIPIME) 1 g in sodium chloride 0.9 % 100 mL IVPB  Status:  Discontinued     1 g 200 mL/hr over 30 Minutes Intravenous Every 24 hours 12/15/18 1623 12/15/18 2158   12/16/18 1200  vancomycin (VANCOCIN) IVPB 750 mg/150 ml premix  Status:  Discontinued     750 mg 150 mL/hr over 60 Minutes Intravenous Every M-W-F (Hemodialysis) 12/15/18 1621 12/15/18 2158   12/16/18 1030  piperacillin-tazobactam (ZOSYN) IVPB 3.375 g     3.375 g 100 mL/hr over 30 Minutes Intravenous  Once 12/16/18 1002 12/16/18 1138   12/15/18 1630  Vancomycin (VANCOCIN) 1,250 mg in sodium chloride 0.9 % 250 mL IVPB     1,250 mg 166.7 mL/hr over 90  Minutes Intravenous  Once 12/15/18 1620 12/15/18 1829   12/15/18 1615  ceFEPIme (MAXIPIME) 1 g in sodium chloride 0.9 % 100 mL IVPB     1 g 200 mL/hr over 30 Minutes Intravenous  Once 12/15/18 1603 12/15/18 1732        Subjective: Patient seen and evaluated today with no new acute complaints or concerns. No acute concerns or events noted overnight.  She was on nonrebreather mask this morning which has been downgraded to nasal cannula.  Objective: Vitals:   12/16/18 1130 12/16/18 1223 12/16/18 1224 12/16/18 1230  BP: (!) 88/65 (!) 106/47    Pulse: 70     Resp: (!) 31     Temp:    98.6 F (37 C)  TempSrc:    Oral  SpO2: 100%  100%   Weight:    71.2 kg  Height:    5\' 9"  (1.753 m)    Intake/Output Summary (Last 24 hours) at 12/16/2018 1304 Last data filed at 12/16/2018 1230 Gross per 24 hour  Intake 392.91 ml  Output -  Net 392.91 ml   Filed Weights   12/15/18 1121 12/16/18 1230  Weight: 68 kg 71.2 kg    Examination:  General exam: Appears calm and comfortable  Respiratory system: Clear to auscultation. Respiratory effort normal.  Nasal cannula oxygen 4 L. Cardiovascular system: S1 & S2 heard, RRR. No JVD, murmurs, rubs, gallops or clicks.  Scant pedal edema noted. Gastrointestinal system: Abdomen is nondistended, soft and nontender. No organomegaly or masses felt. Normal bowel sounds heard. Central nervous system: Alert and oriented. No focal neurological deficits. Extremities: Symmetric 5 x 5 power. Skin: No rashes, lesions or ulcers Psychiatry: Judgement and insight appear normal. Mood & affect appropriate.     Data Reviewed: I have personally reviewed following labs and imaging studies  CBC: Recent Labs  Lab 12/15/18 1229 12/16/18 0157  WBC 12.2* 10.7*  NEUTROABS 10.2*  --   HGB 8.8* 9.1*  HCT 28.6* 30.0*  MCV 101.8* 104.5*  PLT 221 0000000   Basic Metabolic Panel: Recent Labs  Lab 12/15/18 1229 12/15/18 2307 12/16/18 0157  NA 138  --  137  K 3.9  --  4.9  CL 98  --  99  CO2 27  --  28  GLUCOSE 112*  --  107*  BUN 39*  --  41*  CREATININE 6.72*  --  7.41*  CALCIUM 8.5*  --  8.1*  MG  --  2.2  --    GFR: Estimated Creatinine Clearance: 8 mL/min (A) (by  C-G formula based on SCr of 7.41 mg/dL (H)). Liver Function Tests: Recent Labs  Lab 12/15/18 1229 12/16/18 0157  AST 47* 37  ALT 56* 45*  ALKPHOS 108 96  BILITOT 0.7 0.8  PROT 7.0 6.5  ALBUMIN 3.1* 2.8*   No results for input(s): LIPASE, AMYLASE in the last 168 hours. No results for input(s): AMMONIA in the last 168 hours. Coagulation Profile: No results for input(s): INR, PROTIME in the last 168 hours. Cardiac Enzymes: No results for input(s): CKTOTAL, CKMB, CKMBINDEX, TROPONINI in the last 168 hours. BNP (last 3 results) No results for input(s): PROBNP in the last 8760 hours. HbA1C: No results for input(s): HGBA1C in the last 72 hours. CBG: Recent Labs  Lab 12/16/18 0040 12/16/18 0344 12/16/18 0755 12/16/18 1223  GLUCAP 88 97 116* 213*   Lipid Profile: Recent Labs    12/15/18 1229  TRIG 95   Thyroid Function Tests: No results  for input(s): TSH, T4TOTAL, FREET4, T3FREE, THYROIDAB in the last 72 hours. Anemia Panel: Recent Labs    12/15/18 1229  FERRITIN 1,064*   Sepsis Labs: Recent Labs  Lab 12/15/18 1229 12/15/18 1526  PROCALCITON 1.15  --   LATICACIDVEN 1.8 1.2    Recent Results (from the past 240 hour(s))  SARS Coronavirus 2 by RT PCR (hospital order, performed in [facility] hospital lab) Nasopharyngeal Nasopharyngeal Swab     Status: None   Collection Time: 12/15/18  1:00 PM   Specimen: Nasopharyngeal Swab  Result Value Ref Range Status   SARS Coronavirus 2 NEGATIVE NEGATIVE Final    Comment: (NOTE) If result is NEGATIVE SARS-CoV-2 target nucleic acids are NOT DETECTED. The SARS-CoV-2 RNA is generally detectable in upper and lower  respiratory specimens during the acute phase of infection. The lowest  concentration of SARS-CoV-2 viral copies this assay can detect is 250  copies / mL. A negative result does not preclude SARS-CoV-2 infection  and should not be used as the sole basis for treatment or other  patient management decisions.  A  negative result may occur with  improper specimen collection / handling, submission of specimen other  than nasopharyngeal swab, presence of viral mutation(s) within the  areas targeted by this assay, and inadequate number of viral copies  (<250 copies / mL). A negative result must be combined with clinical  observations, patient history, and epidemiological information. If result is POSITIVE SARS-CoV-2 target nucleic acids are DETECTED. The SARS-CoV-2 RNA is generally detectable in upper and lower  respiratory specimens dur ing the acute phase of infection.  Positive  results are indicative of active infection with SARS-CoV-2.  Clinical  correlation with patient history and other diagnostic information is  necessary to determine patient infection status.  Positive results do  not rule out bacterial infection or co-infection with other viruses. If result is PRESUMPTIVE POSTIVE SARS-CoV-2 nucleic acids MAY BE PRESENT.   A presumptive positive result was obtained on the submitted specimen  and confirmed on repeat testing.  While 2019 novel coronavirus  (SARS-CoV-2) nucleic acids may be present in the submitted sample  additional confirmatory testing may be necessary for epidemiological  and / or clinical management purposes  to differentiate between  SARS-CoV-2 and other Sarbecovirus currently known to infect humans.  If clinically indicated additional testing with an alternate test  methodology [phone]) is advised. The SARS-CoV-2 RNA is generally  detectable in upper and lower respiratory sp ecimens during the acute  phase of infection. The expected result is Negative. Fact Sheet for Patients:  StrictlyIdeas.no Fact Sheet for Healthcare Providers: BankingDealers.co.za This test is not yet approved or cleared by the [place] FDA and has been authorized for detection and/or diagnosis of SARS-CoV-2 by FDA under an Emergency Use  Authorization (EUA).  This EUA will remain in effect (meaning this test can be used) for the duration of the COVID-19 declaration under Section 564(b)(1) of the Act, 21 U.S.C. section 360bbb-3(b)(1), unless the authorization is terminated or revoked sooner. Performed at [facility], [address], [place], [place] [address]   Blood Culture (routine x 2)     Status: None (Preliminary result)   Collection Time: 12/15/18  2:11 PM   Specimen: BLOOD RIGHT FOREARM  Result Value Ref Range Status   Specimen Description   Final    BLOOD RIGHT FOREARM BOTTLES DRAWN AEROBIC AND ANAEROBIC   Special Requests Blood Culture adequate volume  Final   Culture   Final  NO GROWTH < 24 HOURS Performed at [facility], [address], [place], [place] [address]    Report Status PENDING  Incomplete  Blood Culture (routine x 2)     Status: None (Preliminary result)   Collection Time: 12/15/18  3:26 PM   Specimen: Right Antecubital; Blood  Result Value Ref Range Status   Specimen Description   Final    RIGHT ANTECUBITAL BOTTLES DRAWN AEROBIC AND ANAEROBIC   Special Requests Blood Culture adequate volume  Final   Culture   Final    NO GROWTH < 24 HOURS Performed at [facility], [address], [place], [place] [address]    Report Status PENDING  Incomplete         Radiology Studies: Ct Angio Chest Pe W/cm &/or Wo Cm  Result Date: 12/15/2018 CLINICAL DATA:  Acute shortness of breath and hypoxia. Dialysis patient. COVID-19 negative. EXAM: CT ANGIOGRAPHY CHEST WITH CONTRAST TECHNIQUE: Multidetector CT imaging of the chest was performed using the standard protocol during bolus administration of intravenous contrast. Multiplanar CT image reconstructions and MIPs were obtained to evaluate the vascular anatomy. CONTRAST:  115mL OMNIPAQUE IOHEXOL 350 MG/ML SOLN COMPARISON:  Chest x-ray from same day. CT chest report dated September 04, 2006. FINDINGS: Cardiovascular: Satisfactory opacification of the pulmonary  arteries to the segmental level. No evidence of pulmonary embolism. Mild cardiomegaly. No pericardial effusion. No thoracic aortic aneurysm or dissection. Coronary, aortic arch, and branch vessel atherosclerotic vascular disease. Mediastinum/Nodes: Mildly enlarged subcarinal node measuring 1.3 cm in short axis, likely reactive. No additional enlarged mediastinal, hilar, or axillary lymph nodes. The thyroid gland, trachea, and esophagus demonstrate no significant findings. Lungs/Pleura: Small to moderate bilateral pleural effusions. Complete collapse of the left lower lobe. Partial collapse of the right lower lobe. Diffuse interlobular septal thickening throughout both lungs. Small area of peribronchovascular ground-glass density in the central right upper lobe. Mild subsegmental atelectasis in the dependent left upper and right middle lobes. No consolidation or pneumothorax. No suspicious pulmonary nodule. Upper Abdomen: No acute abnormality. Musculoskeletal: No chest wall abnormality. No acute or significant osseous findings. Multiple chronic mild thoracic compression deformities. Review of the MIP images confirms the above findings. IMPRESSION: 1.  No evidence of pulmonary embolism. 2. Findings consistent with congestive heart failure. Small to moderate bilateral pleural effusions with diffuse interstitial pulmonary edema. Small area of alveolar pulmonary edema in the central right upper lobe. 3. Complete collapse of the left lower lobe. Partial collapse of the right lower lobe. 4.  Aortic atherosclerosis (ICD10-I70.0). Electronically Signed   By: Titus Dubin M.D.   On: 12/15/2018 19:23   Dg Chest Port 1 View  Result Date: 12/15/2018 CLINICAL DATA:  Pt reports increasing sob starting around 2am. Last dialysis on Friday and wasn't feeling well then and almost passed out EXAM: PORTABLE CHEST 1 VIEW COMPARISON:  Chest radiograph 12/02/2018 FINDINGS: Stable cardiomediastinal contours with enlarged heart size.  Aortic arch calcification. There are diffuse bilateral interstitial opacities which could represent atypical infection or edema. No pneumothorax or large pleural effusion. No acute finding in the visualized skeleton. IMPRESSION: Diffuse bilateral interstitial opacities which could represent atypical infection or edema. Electronically Signed   By: Audie Pinto M.D.   On: 12/15/2018 13:03        Scheduled Meds: . acetylcysteine  3 mL Nebulization Q4H  . amLODipine  5 mg Oral QHS  . Chlorhexidine Gluconate Cloth  6 each Topical Q0600  . guaiFENesin  1,200 mg Oral BID  . insulin aspart  0-9 Units Subcutaneous Q4H  . ipratropium-albuterol  3 mL Nebulization Q4H  . isosorbide mononitrate  60 mg Oral Daily  . methylPREDNISolone (SOLU-MEDROL) injection  40 mg Intravenous Q12H  . metoprolol tartrate  12.5 mg Oral BID  . rosuvastatin  20 mg Oral Daily  . sevelamer carbonate  800 mg Oral See admin instructions  . tacrolimus  0.5-1 mg Oral See admin instructions   Continuous Infusions: . piperacillin-tazobactam (ZOSYN)  IV       LOS: 1 day    Time spent: 30 minutes    Moe Brier Darleen Crocker, DO [facility] Pager [phone]  If 7PM-7AM, please contact night-coverage [email or web address] Password [email or web address] 12/16/2018, 1:04 PM

## 2018-12-16 NOTE — Consult Note (Signed)
Reason for Consult: Volume overload in patient with ESRD, continuity of ESRD care Referring Physician: Heath Lark DO [facility])  HPI:  63 year old Caucasian woman with past medical history significant for hypertension, diabetes mellitus, coronary artery disease, tricuspid regurgitation with pulmonary hypertension, history of congestive heart failure (preserved EF) and end-stage renal disease status post failed transplant now on hemodialysis on a Monday/Wednesday/Friday schedule.  She presented to the emergency room with 1 day history of worsening shortness of breath along with some cough and an episode of nausea/vomiting with subjective fever and chills.  A CT angiogram that was done yesterday for concerns of PE showed no evidence of pulmonary embolism but shows findings consistent with congestive heart failure with bilateral moderate pleural effusions, pulmonary edema and complete collapse of the left lower lobe with partial collapse of the right lower lobe.  When seen in the emergency room, she is intermittently moaning and on oxygen via facemask.  She nods to questions with minimal verbal engagement.  She was just discharged from the hospital about 10 days ago with a diagnosis of sepsis from healthcare associated pneumonia on oral Augmentin.  Past Medical History:  Diagnosis Date  . Anemia    of chronic disease  . Blood transfusion without reported diagnosis   . Cardiovascular disease    nonobstructive  . Carotid artery stenosis 2008  . CHF (congestive heart failure) ([place])   . Coronary artery disease   . Diabetes mellitus   . Dyslipidemia   . Edema of lower extremity    with hypo-albuminemia and profound protenuria  . Heart murmur   . Hypertension   . Mitral regurgitation   . Nephrotic syndrome   . Patent foramen ovale   . Pulmonary hypertension, moderate to severe ([place])   . Pulmonary nodule   . Tricuspid regurgitation   . Volume depletion, renal, due to output loss (renal deficit)      Past Surgical History:  Procedure Laterality Date  . A/V FISTULAGRAM N/A 04/05/2017   Procedure: A/V FISTULAGRAM - Left Arm;  Surgeon: Angelia Mould, MD;  Location: [place] [facility];  Service: Cardiovascular;  Laterality: N/A;  . A/V FISTULAGRAM Left 09/18/2017   Procedure: A/V FISTULAGRAM;  Surgeon: Serafina Mitchell, MD;  Location: [place] [facility];  Service: Cardiovascular;  Laterality: Left;  . A/V SHUNT INTERVENTION Left 04/05/2017   Procedure: A/V SHUNT INTERVENTION;  Surgeon: Angelia Mould, MD;  Location: [place] [facility];  Service: Cardiovascular;  Laterality: Left;  . CARDIAC CATHETERIZATION  2008  . IR REMOVAL TUN CV CATH W/O FL  11/02/2016  . 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 PERIPHERAL VASCULAR BALLOON ANGIOPLASTY Left 09/18/2017   Procedure: PERIPHERAL VASCULAR BALLOON ANGIOPLASTY;  Surgeon: Serafina Mitchell, MD;  Location: [place] [facility];  Service: Cardiovascular;  Laterality: Left;  Arm Fistula    Family History  Problem Relation Age of Onset  . Kidney disease Mother   . Diabetes Mother   . Diabetes Father   . Heart disease Father     Social History:  reports that she has never smoked. She has never used smokeless tobacco. She reports that she does not drink alcohol or use drugs.  Allergies:  Allergies  Allergen Reactions  . Other Other (See Comments)    Tape Bruises and tears skin, Paper tape tolerated  . Tape Other (See Comments)    Bruises and tears skin Paper tape tolerated Bruises and tears skin Paper tape tolerated Bruises and tears skin Paper tape  tolerated    Medications:  Scheduled: . acetylcysteine  3 mL Nebulization Q4H  . amLODipine  5 mg Oral QHS  . guaiFENesin  1,200 mg Oral BID  . insulin aspart  0-9 Units Subcutaneous Q4H  . ipratropium-albuterol  3 mL Nebulization Q4H  . isosorbide mononitrate  60 mg Oral Daily  . methylPREDNISolone (SOLU-MEDROL) injection  40 mg Intravenous Q12H  . metoprolol tartrate   12.5 mg Oral BID  . rosuvastatin  20 mg Oral Daily  . sevelamer carbonate  800 mg Oral See admin instructions  . tacrolimus  0.5-1 mg Oral See admin instructions   BMP Latest Ref Rng & Units 12/16/2018 12/15/2018 12/03/2018  Glucose 70 - 99 mg/dL 107(H) 112(H) 189(H)  BUN 8 - 23 mg/dL 41(H) 39(H) 43(H)  Creatinine 0.44 - 1.00 mg/dL 7.41(H) 6.72(H) 6.02(H)  BUN/Creat Ratio 12 - 28 - - -  Sodium 135 - 145 mmol/L 137 138 136  Potassium 3.5 - 5.1 mmol/L 4.9 3.9 4.7  Chloride 98 - 111 mmol/L 99 98 98  CO2 22 - 32 mmol/L 28 27 23   Calcium 8.9 - 10.3 mg/dL 8.1(L) 8.5(L) 8.2(L)   CBC Latest Ref Rng & Units 12/16/2018 12/15/2018 12/04/2018  WBC 4.0 - 10.5 K/uL 10.7(H) 12.2(H) 10.2  Hemoglobin 12.0 - 15.0 g/dL 9.1(L) 8.8(L) 9.8(L)  Hematocrit 36.0 - 46.0 % 30.0(L) 28.6(L) 31.1(L)  Platelets 150 - 400 K/uL 160 221 155     Ct Angio Chest Pe W/cm &/or Wo Cm  Result Date: 12/15/2018 CLINICAL DATA:  Acute shortness of breath and hypoxia. Dialysis patient. COVID-19 negative. EXAM: CT ANGIOGRAPHY CHEST WITH CONTRAST TECHNIQUE: Multidetector CT imaging of the chest was performed using the standard protocol during bolus administration of intravenous contrast. Multiplanar CT image reconstructions and MIPs were obtained to evaluate the vascular anatomy. CONTRAST:  148mL OMNIPAQUE IOHEXOL 350 MG/ML SOLN COMPARISON:  Chest x-ray from same day. CT chest report dated September 04, 2006. FINDINGS: Cardiovascular: Satisfactory opacification of the pulmonary arteries to the segmental level. No evidence of pulmonary embolism. Mild cardiomegaly. No pericardial effusion. No thoracic aortic aneurysm or dissection. Coronary, aortic arch, and branch vessel atherosclerotic vascular disease. Mediastinum/Nodes: Mildly enlarged subcarinal node measuring 1.3 cm in short axis, likely reactive. No additional enlarged mediastinal, hilar, or axillary lymph nodes. The thyroid gland, trachea, and esophagus demonstrate no significant  findings. Lungs/Pleura: Small to moderate bilateral pleural effusions. Complete collapse of the left lower lobe. Partial collapse of the right lower lobe. Diffuse interlobular septal thickening throughout both lungs. Small area of peribronchovascular ground-glass density in the central right upper lobe. Mild subsegmental atelectasis in the dependent left upper and right middle lobes. No consolidation or pneumothorax. No suspicious pulmonary nodule. Upper Abdomen: No acute abnormality. Musculoskeletal: No chest wall abnormality. No acute or significant osseous findings. Multiple chronic mild thoracic compression deformities. Review of the MIP images confirms the above findings. IMPRESSION: 1.  No evidence of pulmonary embolism. 2. Findings consistent with congestive heart failure. Small to moderate bilateral pleural effusions with diffuse interstitial pulmonary edema. Small area of alveolar pulmonary edema in the central right upper lobe. 3. Complete collapse of the left lower lobe. Partial collapse of the right lower lobe. 4.  Aortic atherosclerosis (ICD10-I70.0). Electronically Signed   By: Titus Dubin M.D.   On: 12/15/2018 19:23   Dg Chest Port 1 View  Result Date: 12/15/2018 CLINICAL DATA:  Pt reports increasing sob starting around 2am. Last dialysis on Friday and wasn't feeling well then and almost passed out  EXAM: PORTABLE CHEST 1 VIEW COMPARISON:  Chest radiograph 12/02/2018 FINDINGS: Stable cardiomediastinal contours with enlarged heart size. Aortic arch calcification. There are diffuse bilateral interstitial opacities which could represent atypical infection or edema. No pneumothorax or large pleural effusion. No acute finding in the visualized skeleton. IMPRESSION: Diffuse bilateral interstitial opacities which could represent atypical infection or edema. Electronically Signed   By: Audie Pinto M.D.   On: 12/15/2018 13:03    Review of Systems  Constitutional: Positive for chills and  malaise/fatigue.  HENT: Negative.   Respiratory: Positive for cough and shortness of breath. Negative for sputum production.   Cardiovascular: Positive for leg swelling. Negative for chest pain, orthopnea and claudication.       She reports chronic/unchanged pedal edema  Gastrointestinal: Positive for nausea and vomiting. Negative for constipation and heartburn.  Genitourinary: Negative.   Musculoskeletal: Negative.   Skin: Negative.   Neurological: Negative for dizziness, focal weakness and headaches.   Blood pressure (!) 129/45, pulse 86, temperature 97.8 F (36.6 C), temperature source Oral, resp. rate (!) 25, height 5\' 9"  (1.753 m), weight 68 kg, SpO2 100 %. Physical Exam  Nursing note and vitals reviewed. Constitutional:  Appears cachectic and chronically ill, older than stated age  HENT:  Head: Normocephalic and atraumatic.  Mouth/Throat: Oropharynx is clear and moist.  Eyes: Pupils are equal, round, and reactive to light. Conjunctivae are normal. No scleral icterus.  Neck: Neck supple. JVD present. No thyromegaly present.  Cardiovascular: Normal rate and regular rhythm.  Murmur heard. S3 gallop  Respiratory: Effort normal. She has wheezes. She has rales.  Expiratory rhonchi with decreased breath sounds right side  GI: Soft. Bowel sounds are normal. There is no abdominal tenderness. There is no rebound.  Musculoskeletal:        General: Edema present.     Comments: Left radiocephalic fistula, poor thrill.  Bilateral 1+-2+ lower extremity edema  Neurological: She is alert.  Skin: Skin is warm and dry. No rash noted. She is not diaphoretic. There is pallor.    Assessment/Plan: 1.  Acute hypoxic respiratory failure: Seen earlier by Dr. Luan Pulling of pulmonology and appears to likely related to aspiration event.  Started on bronchodilators with mucolytic therapy with plans for possible bronchoscopy to evaluate further.  Recently treated for pneumonia and with need to maintain high  index of suspicion for malignancy due to previous immunosuppressive therapy for renal transplant.  With the presence of pleural effusions and pulmonary edema, I suspect that there is a role from her chronic volume overload which she describes that may have likely been exacerbated in the recent past. 2.  End-stage renal disease: She is usually on a Monday/Wednesday/Friday dialysis schedule and appears to have volume excess based on physical exam and radiological imaging.  Will undertake hemodialysis today with efforts at ultrafiltration/challenging dry weight. 3.  Anemia: Secondary to end-stage renal disease/chronic illness.  Resume ESA and monitor with ultrafiltration/hemodialysis.  Suspect this in part might be dilutional from her volume excess status. 4.  Hypertension: Blood pressure mildly elevated, monitor with hemodialysis/ultrafiltration. 5.  Secondary hyperparathyroidism: Continue renal diet and restart phosphorus binders, will reconcile medications for vitamin D receptor analog for PTH management.  Nahia Nissan K. 12/16/2018, 8:37 AM

## 2018-12-16 NOTE — Procedures (Signed)
    HEMODIALYSIS TREATMENT NOTE:  4 hour low-heparin dialysis completed via left forearm AVF (15g/antegrade). Goal NOT met despite administration of Albumin 25g x 3.  UF repeatedly interrupted and/or rate decreased in response to SBP<100.  Net UF 1.5L.  All blood was returned and hemostasis was achieved in 10 minutes.  [name], RN

## 2018-12-16 NOTE — ED Notes (Signed)
Pt has a pure wick applied. NAD.

## 2018-12-16 NOTE — Progress Notes (Signed)
Pharmacy Antibiotic Note  [name] is a 63 y.o. female admitted on 12/15/2018 with aspiration pneumonia.  Patient is on HD with MWF schedule.  Pharmacy has been consulted for Zosyn dosing.  Plan: Zosyn 3.375g IV every 12 hours.  Monitor labs, c/s, and patient improvement.  Height: 5\' 9"  (175.3 cm) Weight: 150 lb (68 kg) IBW/kg (Calculated) : 66.2  Temp (24hrs), Avg:97.8 F (36.6 C), Min:97.8 F (36.6 C), Max:97.8 F (36.6 C)  Recent Labs  Lab 12/15/18 1229 12/15/18 1526 12/16/18 0157  WBC 12.2*  --  10.7*  CREATININE 6.72*  --  7.41*  LATICACIDVEN 1.8 1.2  --     Estimated Creatinine Clearance: 8 mL/min (A) (by C-G formula based on SCr of 7.41 mg/dL (H)).    Allergies  Allergen Reactions  . Other Other (See Comments)    Tape Bruises and tears skin, Paper tape tolerated  . Tape Other (See Comments)    Bruises and tears skin Paper tape tolerated Bruises and tears skin Paper tape tolerated Bruises and tears skin Paper tape tolerated    Antimicrobials this admission: 10/12 Zosyn >>  10/11 Vanco/Cefepime x 1 dose  Dose adjustments this admission: Zosyn  Microbiology results: 10/11 BCx: ngtd  10/11 MRSA PCR: pending  Thank you for allowing pharmacy to be a part of this patient's care.  [name] 12/16/2018 10:09 AM

## 2018-12-16 NOTE — ED Notes (Signed)
Have notified respiratory.  

## 2018-12-16 NOTE — Progress Notes (Signed)
Consult requested by: [facility] hospitalist, Dr. Manuella Ghazi Consult requested for: Respiratory failure  HPI: This is a 63 year old who came to the emergency room with fairly sudden onset of shortness of breath.  She is known to have end-stage renal disease on dialysis and post failed kidney transplant, diastolic CHF pulmonary hypertension, coronary disease, hypertension, diabetes.  She said that her problem with her breathing started about 2 AM yesterday.  She eventually came to the emergency department later in the day.  She has a home oximeter and she measured that and it was 74% on room air.  She did not give an initial history of nausea or vomiting but she tells me that she had nausea and vomiting.  There was concern that she might have a pulmonary embolism so she had CT angiogram which shows that she has collapse of the left lower lobe and partial collapse of the right lower lobe.  I suspect that this is related to aspiration.  She was hospitalized last month with sepsis related to pneumonia.  She initially was on 2 L nasal cannula with O2 saturation in the 95 to 100% range but she is now on a nonrebreather and saturating 100%.  She is coughing nonproductively.  She denies diarrhea.  She did not have any fever at home that she is aware of.  She has some chest pain that she associates with her cough.  She has had some swelling of her legs and she says that is normal for her.  No angina.  She has had a headache that she relates to the cough.  Past Medical History:  Diagnosis Date  . Anemia    of chronic disease  . Blood transfusion without reported diagnosis   . Cardiovascular disease    nonobstructive  . Carotid artery stenosis 2008  . CHF (congestive heart failure) ([place])   . Coronary artery disease   . Diabetes mellitus   . Dyslipidemia   . Edema of lower extremity    with hypo-albuminemia and profound protenuria  . Heart murmur   . Hypertension   . Mitral regurgitation   . Nephrotic syndrome   .  Patent foramen ovale   . Pulmonary hypertension, moderate to severe ([place])   . Pulmonary nodule   . Tricuspid regurgitation   . Volume depletion, renal, due to output loss (renal deficit)      Family History  Problem Relation Age of Onset  . Kidney disease Mother   . Diabetes Mother   . Diabetes Father   . Heart disease Father     Unaware of family history of lung disease Social History   Socioeconomic History  . Marital status: Married    Spouse name: Not on file  . Number of children: Not on file  . Years of education: Not on file  . Highest education level: Not on file  Occupational History  . Not on file  Social Needs  . Financial resource strain: Not on file  . Food insecurity    Worry: Not on file    Inability: Not on file  . Transportation needs    Medical: Not on file    Non-medical: Not on file  Tobacco Use  . Smoking status: Never Smoker  . Smokeless tobacco: Never Used  Substance and Sexual Activity  . Alcohol use: No  . Drug use: No  . Sexual activity: Never  Lifestyle  . Physical activity    Days per week: Not on file    Minutes per  session: Not on file  . Stress: Not on file  Relationships  . Social Herbalist on phone: Not on file    Gets together: Not on file    Attends religious service: Not on file    Active member of club or organization: Not on file    Attends meetings of clubs or organizations: Not on file    Relationship status: Not on file  Other Topics Concern  . Not on file  Social History Narrative  . Not on file     ROS: Except as mentioned 10 point review of systems is negative    Objective: Vital signs in last 24 hours: Temp:  [97.8 F (36.6 C)] 97.8 F (36.6 C) (10/11 1123) Pulse Rate:  [57-90] 86 (10/12 0800) Resp:  [13-31] 25 (10/12 0800) BP: (106-168)/(32-91) 129/45 (10/12 0800) SpO2:  [86 %-100 %] 100 % (10/12 0800) FiO2 (%):  [28 %] 28 % (10/12 0013) Weight:  [68 kg] 68 kg (10/11 1121) Weight change:      Intake/Output from previous day: 10/11 0701 - 10/12 0700 In: 349.7 [IV Piggyback:349.7] Out: -   PHYSICAL EXAM Constitutional: She is awake and alert.  She looks acutely sick.  She is on a nonrebreather mask.  Eyes: Pupils react EOMI.  Ears nose mouth and throat: Hearing is grossly normal.  Mucous membranes are dry.  Cardiovascular: Her heart is regular with rate about 90.  No gallop.  Respiratory: She has some rhonchi bilaterally no wheezing.  Gastrointestinal: Her abdomen is soft with no masses.  Musculoskeletal: Grossly normal strength bilaterally.  Neurological: No focal abnormalities.  Psychiatric: She is mildly anxious  Lab Results: Basic Metabolic Panel: Recent Labs    12/15/18 1229 12/15/18 2307 12/16/18 0157  NA 138  --  137  K 3.9  --  4.9  CL 98  --  99  CO2 27  --  28  GLUCOSE 112*  --  107*  BUN 39*  --  41*  CREATININE 6.72*  --  7.41*  CALCIUM 8.5*  --  8.1*  MG  --  2.2  --    Liver Function Tests: Recent Labs    12/15/18 1229 12/16/18 0157  AST 47* 37  ALT 56* 45*  ALKPHOS 108 96  BILITOT 0.7 0.8  PROT 7.0 6.5  ALBUMIN 3.1* 2.8*   No results for input(s): LIPASE, AMYLASE in the last 72 hours. No results for input(s): AMMONIA in the last 72 hours. CBC: Recent Labs    12/15/18 1229 12/16/18 0157  WBC 12.2* 10.7*  NEUTROABS 10.2*  --   HGB 8.8* 9.1*  HCT 28.6* 30.0*  MCV 101.8* 104.5*  PLT 221 160   Cardiac Enzymes: No results for input(s): CKTOTAL, CKMB, CKMBINDEX, TROPONINI in the last 72 hours. BNP: No results for input(s): PROBNP in the last 72 hours. D-Dimer: Recent Labs    12/15/18 1229  DDIMER 1.57*   CBG: Recent Labs    12/16/18 0040 12/16/18 0344 12/16/18 0755  GLUCAP 88 97 116*   Hemoglobin A1C: No results for input(s): HGBA1C in the last 72 hours. Fasting Lipid Panel: Recent Labs    12/15/18 1229  TRIG 95   Thyroid Function Tests: No results for input(s): TSH, T4TOTAL, FREET4, T3FREE, THYROIDAB in the last  72 hours. Anemia Panel: Recent Labs    12/15/18 1229  FERRITIN 1,064*   Coagulation: No results for input(s): LABPROT, INR in the last 72 hours. Urine Drug Screen: Drugs of  Abuse  No results found for: LABOPIA, COCAINSCRNUR, LABBENZ, AMPHETMU, THCU, LABBARB  Alcohol Level: No results for input(s): ETH in the last 72 hours. Urinalysis: No results for input(s): COLORURINE, LABSPEC, PHURINE, GLUCOSEU, HGBUR, BILIRUBINUR, KETONESUR, PROTEINUR, UROBILINOGEN, NITRITE, LEUKOCYTESUR in the last 72 hours.  Invalid input(s): APPERANCEUR Misc. Labs:   ABGS: No results for input(s): PHART, PO2ART, TCO2, HCO3 in the last 72 hours.  Invalid input(s): PCO2   MICROBIOLOGY: Recent Results (from the past 240 hour(s))  SARS Coronavirus 2 by RT PCR (hospital order, performed in [facility] hospital lab) Nasopharyngeal Nasopharyngeal Swab     Status: None   Collection Time: 12/15/18  1:00 PM   Specimen: Nasopharyngeal Swab  Result Value Ref Range Status   SARS Coronavirus 2 NEGATIVE NEGATIVE Final    Comment: (NOTE) If result is NEGATIVE SARS-CoV-2 target nucleic acids are NOT DETECTED. The SARS-CoV-2 RNA is generally detectable in upper and lower  respiratory specimens during the acute phase of infection. The lowest  concentration of SARS-CoV-2 viral copies this assay can detect is 250  copies / mL. A negative result does not preclude SARS-CoV-2 infection  and should not be used as the sole basis for treatment or other  patient management decisions.  A negative result may occur with  improper specimen collection / handling, submission of specimen other  than nasopharyngeal swab, presence of viral mutation(s) within the  areas targeted by this assay, and inadequate number of viral copies  (<250 copies / mL). A negative result must be combined with clinical  observations, patient history, and epidemiological information. If result is POSITIVE SARS-CoV-2 target nucleic acids are  DETECTED. The SARS-CoV-2 RNA is generally detectable in upper and lower  respiratory specimens dur ing the acute phase of infection.  Positive  results are indicative of active infection with SARS-CoV-2.  Clinical  correlation with patient history and other diagnostic information is  necessary to determine patient infection status.  Positive results do  not rule out bacterial infection or co-infection with other viruses. If result is PRESUMPTIVE POSTIVE SARS-CoV-2 nucleic acids MAY BE PRESENT.   A presumptive positive result was obtained on the submitted specimen  and confirmed on repeat testing.  While 2019 novel coronavirus  (SARS-CoV-2) nucleic acids may be present in the submitted sample  additional confirmatory testing may be necessary for epidemiological  and / or clinical management purposes  to differentiate between  SARS-CoV-2 and other Sarbecovirus currently known to infect humans.  If clinically indicated additional testing with an alternate test  methodology [id number]) is advised. The SARS-CoV-2 RNA is generally  detectable in upper and lower respiratory sp ecimens during the acute  phase of infection. The expected result is Negative. Fact Sheet for Patients:  StrictlyIdeas.no Fact Sheet for Healthcare Providers: BankingDealers.co.za This test is not yet approved or cleared by the [place] FDA and has been authorized for detection and/or diagnosis of SARS-CoV-2 by FDA under an Emergency Use Authorization (EUA).  This EUA will remain in effect (meaning this test can be used) for the duration of the COVID-19 declaration under Section 564(b)(1) of the Act, 21 U.S.C. section 360bbb-3(b)(1), unless the authorization is terminated or revoked sooner. Performed at [facility], [address], [place], [place] [address]   Blood Culture (routine x 2)     Status: None (Preliminary result)   Collection Time: 12/15/18  2:11 PM    Specimen: BLOOD RIGHT FOREARM  Result Value Ref Range Status   Specimen Description   Final  BLOOD RIGHT FOREARM BOTTLES DRAWN AEROBIC AND ANAEROBIC   Special Requests Blood Culture adequate volume  Final   Culture   Final    NO GROWTH < 24 HOURS Performed at [facility], [address], [place], [place] [address]    Report Status PENDING  Incomplete  Blood Culture (routine x 2)     Status: None (Preliminary result)   Collection Time: 12/15/18  3:26 PM   Specimen: Right Antecubital; Blood  Result Value Ref Range Status   Specimen Description   Final    RIGHT ANTECUBITAL BOTTLES DRAWN AEROBIC AND ANAEROBIC   Special Requests Blood Culture adequate volume  Final   Culture   Final    NO GROWTH < 24 HOURS Performed at [facility], [address] [place], [place] [address]    Report Status PENDING  Incomplete    Studies/Results: Ct Angio Chest Pe W/cm &/or Wo Cm  Result Date: 12/15/2018 CLINICAL DATA:  Acute shortness of breath and hypoxia. Dialysis patient. COVID-19 negative. EXAM: CT ANGIOGRAPHY CHEST WITH CONTRAST TECHNIQUE: Multidetector CT imaging of the chest was performed using the standard protocol during bolus administration of intravenous contrast. Multiplanar CT image reconstructions and MIPs were obtained to evaluate the vascular anatomy. CONTRAST:  153mL OMNIPAQUE IOHEXOL 350 MG/ML SOLN COMPARISON:  Chest x-ray from same day. CT chest report dated September 04, 2006. FINDINGS: Cardiovascular: Satisfactory opacification of the pulmonary arteries to the segmental level. No evidence of pulmonary embolism. Mild cardiomegaly. No pericardial effusion. No thoracic aortic aneurysm or dissection. Coronary, aortic arch, and branch vessel atherosclerotic vascular disease. Mediastinum/Nodes: Mildly enlarged subcarinal node measuring 1.3 cm in short axis, likely reactive. No additional enlarged mediastinal, hilar, or axillary lymph nodes. The thyroid gland, trachea, and esophagus demonstrate no  significant findings. Lungs/Pleura: Small to moderate bilateral pleural effusions. Complete collapse of the left lower lobe. Partial collapse of the right lower lobe. Diffuse interlobular septal thickening throughout both lungs. Small area of peribronchovascular ground-glass density in the central right upper lobe. Mild subsegmental atelectasis in the dependent left upper and right middle lobes. No consolidation or pneumothorax. No suspicious pulmonary nodule. Upper Abdomen: No acute abnormality. Musculoskeletal: No chest wall abnormality. No acute or significant osseous findings. Multiple chronic mild thoracic compression deformities. Review of the MIP images confirms the above findings. IMPRESSION: 1.  No evidence of pulmonary embolism. 2. Findings consistent with congestive heart failure. Small to moderate bilateral pleural effusions with diffuse interstitial pulmonary edema. Small area of alveolar pulmonary edema in the central right upper lobe. 3. Complete collapse of the left lower lobe. Partial collapse of the right lower lobe. 4.  Aortic atherosclerosis (ICD10-I70.0). Electronically Signed   By: Titus Dubin M.D.   On: 12/15/2018 19:23   Dg Chest Port 1 View  Result Date: 12/15/2018 CLINICAL DATA:  Pt reports increasing sob starting around 2am. Last dialysis on Friday and wasn't feeling well then and almost passed out EXAM: PORTABLE CHEST 1 VIEW COMPARISON:  Chest radiograph 12/02/2018 FINDINGS: Stable cardiomediastinal contours with enlarged heart size. Aortic arch calcification. There are diffuse bilateral interstitial opacities which could represent atypical infection or edema. No pneumothorax or large pleural effusion. No acute finding in the visualized skeleton. IMPRESSION: Diffuse bilateral interstitial opacities which could represent atypical infection or edema. Electronically Signed   By: Audie Pinto M.D.   On: 12/15/2018 13:03    Medications:  Prior to Admission: (Not in a hospital  admission)  Scheduled: . acetylcysteine  3 mL Nebulization Q4H  . amLODipine  5 mg Oral QHS  . guaiFENesin  1,200 mg Oral BID  . insulin aspart  0-9 Units Subcutaneous Q4H  . ipratropium-albuterol  3 mL Nebulization Q4H  . isosorbide mononitrate  60 mg Oral Daily  . methylPREDNISolone (SOLU-MEDROL) injection  40 mg Intravenous Q12H  . metoprolol tartrate  12.5 mg Oral BID  . rosuvastatin  20 mg Oral Daily  . sevelamer carbonate  800 mg Oral See admin instructions  . tacrolimus  0.5-1 mg Oral See admin instructions   Continuous:  [id number] **OR** acetaminophen, meclizine, ondansetron **OR** ondansetron (ZOFRAN) IV, polyethylene glycol  Assesment: She has lobar collapse.  She is hypoxic likely from that.  The history that she gives me is different from the history that she gave the admitting physician and she tells me that she had significant vomiting at home and I suspect she has aspirated.  Agree with broad-spectrum antibiotics.  She may need bronchoscopy but I like to give her medical treatment first with bronchodilators mucolytic's particular with her hypoxia.  I think she would have to be intubated to safely do bronchoscopy  She has acute hypoxic respiratory failure and is on a nonrebreather now.  Oxygen saturation is good.  She has end-stage renal disease on dialysis and dialysis is planned for later today  She has diabetes on sliding scale  She has chronic diastolic heart failure and still has some swelling of her legs and she says that is about like it normally is  She has elevated d-dimer but no evidence of pulmonary embolism Active Problems:   Dyspnea   Lung collapse   Acute hypoxemic respiratory failure ([place])    Plan: Add mucolytic's.  Follow closely.  She may need bronchoscopy.  Discussed with Dr. Manuella Ghazi.  She is currently admitted to telemetry bed but I think she is going to need stepdown with her hypoxia    LOS: 1 day   Alonza Bogus 12/16/2018, 8:05  AM

## 2018-12-17 LAB — COMPREHENSIVE METABOLIC PANEL
ALT: 35 U/L (ref 0–44)
AST: 33 U/L (ref 15–41)
Albumin: 3.2 g/dL — ABNORMAL LOW (ref 3.5–5.0)
Alkaline Phosphatase: 84 U/L (ref 38–126)
Anion gap: 19 — ABNORMAL HIGH (ref 5–15)
BUN: 32 mg/dL — ABNORMAL HIGH (ref 8–23)
CO2: 22 mmol/L (ref 22–32)
Calcium: 8.3 mg/dL — ABNORMAL LOW (ref 8.9–10.3)
Chloride: 91 mmol/L — ABNORMAL LOW (ref 98–111)
Creatinine, Ser: 4.39 mg/dL — ABNORMAL HIGH (ref 0.44–1.00)
GFR calc Af Amer: 12 mL/min — ABNORMAL LOW (ref >60)
GFR calc non Af Amer: 10 mL/min — ABNORMAL LOW (ref >60)
Glucose, Bld: 414 mg/dL — ABNORMAL HIGH (ref 70–99)
Potassium: 4.5 mmol/L (ref 3.5–5.1)
Sodium: 132 mmol/L — ABNORMAL LOW (ref 135–145)
Total Bilirubin: 1.5 mg/dL — ABNORMAL HIGH (ref 0.3–1.2)
Total Protein: 6.3 g/dL — ABNORMAL LOW (ref 6.5–8.1)

## 2018-12-17 LAB — IRON AND TIBC
Iron: 39 ug/dL (ref 28–170)
Saturation Ratios: 21 % (ref 10.4–31.8)
TIBC: 183 ug/dL — ABNORMAL LOW (ref 250–450)
UIBC: 144 ug/dL

## 2018-12-17 LAB — CBC
HCT: 24.9 % — ABNORMAL LOW (ref 36.0–46.0)
Hemoglobin: 7.8 g/dL — ABNORMAL LOW (ref 12.0–15.0)
MCH: 32 pg (ref 26.0–34.0)
MCHC: 31.3 g/dL (ref 30.0–36.0)
MCV: 102 fL — ABNORMAL HIGH (ref 80.0–100.0)
Platelets: 137 10*3/uL — ABNORMAL LOW (ref 150–400)
RBC: 2.44 MIL/uL — ABNORMAL LOW (ref 3.87–5.11)
RDW: 13.4 % (ref 11.5–15.5)
WBC: 16.6 10*3/uL — ABNORMAL HIGH (ref 4.0–10.5)
nRBC: 0 % (ref 0.0–0.2)

## 2018-12-17 LAB — GLUCOSE, CAPILLARY
Glucose-Capillary: 117 mg/dL — ABNORMAL HIGH (ref 70–99)
Glucose-Capillary: 132 mg/dL — ABNORMAL HIGH (ref 70–99)
Glucose-Capillary: 300 mg/dL — ABNORMAL HIGH (ref 70–99)
Glucose-Capillary: 467 mg/dL — ABNORMAL HIGH (ref 70–99)
Glucose-Capillary: 91 mg/dL (ref 70–99)

## 2018-12-17 LAB — VITAMIN B12: Vitamin B-12: 683 pg/mL (ref 180–914)

## 2018-12-17 LAB — FERRITIN: Ferritin: 989 ng/mL — ABNORMAL HIGH (ref 11–307)

## 2018-12-17 LAB — FOLATE: Folate: 32.1 ng/mL (ref >5.9)

## 2018-12-17 LAB — RETICULOCYTES
Immature Retic Fract: 12.8 % (ref 2.3–15.9)
RBC.: 2.32 MIL/uL — ABNORMAL LOW (ref 3.87–5.11)
Retic Count, Absolute: 58.9 10*3/uL (ref 19.0–186.0)
Retic Ct Pct: 2.5 % (ref 0.4–3.1)

## 2018-12-17 MED ORDER — DARBEPOETIN ALFA 60 MCG/0.3ML IJ SOSY
60.0000 ug | PREFILLED_SYRINGE | INTRAMUSCULAR | Status: DC
  Administered 2018-12-18: 60 ug via INTRAVENOUS
  Filled 2018-12-17: qty 0.3

## 2018-12-17 MED ORDER — PREDNISONE 20 MG PO TABS
40.0000 mg | ORAL_TABLET | Freq: Every day | ORAL | Status: DC
  Administered 2018-12-17 – 2018-12-19 (×3): 40 mg via ORAL
  Filled 2018-12-17 (×3): qty 2

## 2018-12-17 MED ORDER — INSULIN DETEMIR 100 UNIT/ML ~~LOC~~ SOLN
10.0000 [IU] | Freq: Every day | SUBCUTANEOUS | Status: DC
  Administered 2018-12-17 – 2018-12-19 (×3): 10 [IU] via SUBCUTANEOUS
  Filled 2018-12-17 (×4): qty 0.1

## 2018-12-17 MED ORDER — INSULIN ASPART 100 UNIT/ML ~~LOC~~ SOLN
0.0000 [IU] | SUBCUTANEOUS | Status: DC
  Administered 2018-12-17: 11 [IU] via SUBCUTANEOUS
  Administered 2018-12-17: 20 [IU] via SUBCUTANEOUS
  Administered 2018-12-18: 3 [IU] via SUBCUTANEOUS
  Administered 2018-12-18 (×3): 4 [IU] via SUBCUTANEOUS

## 2018-12-17 MED ORDER — PANTOPRAZOLE SODIUM 40 MG IV SOLR
40.0000 mg | INTRAVENOUS | Status: DC
  Administered 2018-12-17 – 2018-12-19 (×3): 40 mg via INTRAVENOUS
  Filled 2018-12-17 (×3): qty 40

## 2018-12-17 MED ORDER — IPRATROPIUM-ALBUTEROL 0.5-2.5 (3) MG/3ML IN SOLN: 3.0000 mL | RESPIRATORY_TRACT | Status: DC | PRN

## 2018-12-17 MED ORDER — INSULIN ASPART 100 UNIT/ML ~~LOC~~ SOLN
3.0000 [IU] | Freq: Three times a day (TID) | SUBCUTANEOUS | Status: DC
  Administered 2018-12-17 – 2018-12-19 (×8): 3 [IU] via SUBCUTANEOUS

## 2018-12-17 NOTE — Progress Notes (Signed)
Patient ID: [name], female   DOB: [birth year], 63 y.o.   MRN: [id number]  [facility] Progress Note   Assessment/ Plan:   1.  Acute hypoxic respiratory failure:  Suspected to be bi-factorial from lung collapse (possibly aspiration related) as well as volume overload.  Improvement noted with bronchodilator/corticosteroid treatment along with hemodialysis overnight.  Awaiting repeat chest x-ray to help decide on additional management that may include bronchoscopy. 2.  End-stage renal disease: Continue hemodialysis on a Monday/Wednesday/Friday schedule with efforts at lowering dry weight; she does appear to have evidence of chronic volume excess with presence of pedal edema and pleural effusions which likely has been difficult to manage due to her chronic intermittent hypotension. 3.  Anemia: Secondary to end-stage renal disease/chronic illness.    Hemoglobin/hematocrit trending down without overt loss, resume ESA with hemodialysis and follow-up on iron studies. 4.  Hypertension: Blood pressure within acceptable range with some intradialytic hypotension noted limiting UF goal. 5.  Secondary hyperparathyroidism:  Continue renal diet at this time along with sevelamer for phosphorus binding; will obtain outpatient records to reconcile VDRA/calcimimetic dosing.  Subjective:   Reports to be feeling better this morning with regards to shortness of breath.  Denies any chest pain.   Objective:   BP (!) 117/54   Pulse 73   Temp 98.2 F (36.8 C) (Oral)   Resp 16   Ht 5\' 9"  (1.753 m)   Wt 71.3 kg   SpO2 100%   BMI 23.21 kg/m   Physical Exam: Gen: Comfortably sitting up in bed, eating breakfast CVS: Pulse regular rhythm, normal rate, S1 and S2 normal Resp: Diminished breath sounds bilateral bases with no rhonchi/rales Abd: Soft, flat, nontender Ext: Trace-1+ lower extremity edema  Labs: BMET Recent Labs  Lab 12/15/18 1229 12/16/18 0157 12/17/18 0459  NA 138 137 132*  K  3.9 4.9 4.5  CL 98 99 91*  CO2 27 28 22   GLUCOSE 112* 107* 414*  BUN 39* 41* 32*  CREATININE 6.72* 7.41* 4.39*  CALCIUM 8.5* 8.1* 8.3*   CBC Recent Labs  Lab 12/15/18 1229 12/16/18 0157 12/17/18 0459  WBC 12.2* 10.7* 16.6*  NEUTROABS 10.2*  --   --   HGB 8.8* 9.1* 7.8*  HCT 28.6* 30.0* 24.9*  MCV 101.8* 104.5* 102.0*  PLT 221 160 137*   Medications:    . acetylcysteine  3 mL Nebulization Q4H  . Chlorhexidine Gluconate Cloth  6 each Topical Q0600  . guaiFENesin  1,200 mg Oral BID  . insulin aspart  0-20 Units Subcutaneous Q4H  . insulin aspart  3 Units Subcutaneous TID WC  . insulin detemir  10 Units Subcutaneous Daily  . metoprolol tartrate  12.5 mg Oral BID  . pantoprazole (PROTONIX) IV  40 mg Intravenous Q24H  . predniSONE  40 mg Oral Q breakfast  . sevelamer carbonate  3,200 mg Oral TID WC  . tacrolimus  0.5 mg Oral q morning - 10a   Elmarie Shiley, MD 12/17/2018, 8:07 AM

## 2018-12-17 NOTE — Progress Notes (Signed)
PROGRESS NOTE    [name]  [id number] DOB: [birth year] DOA: 12/15/2018 PCP: Sue Balloon, FNP   Brief Narrative:  Per HPI: Sue Vanacker Goinsis a 63 y.o.femalewith medical history significant forESRD,diastolic congestive heart failure and pulmonary hypertension, coronary artery disease, hypertension,, diabetes mellitus.Patient presented to the ED with complaints of difficulty breathing that started at about 2 AM today. Patient recorded her O2 sats with a home O2 device and it was 74% on room air. She denies any other respiratory symptoms, no cough, no chest pain. She has chronic stable mild lower extremity swelling that is unchanged. She reports compliance with fluid and low-salt intake. She is on dialysis Monday Wednesday Friday, her last HD session was Friday.  Recent hospitalization9/28-9/30-sepsis secondary to pneumoniawith acute respiratory failure, treated with broad-spectrum antibiotics.  10/12: Patient is noted to have some findings of lung collapse with suspected aspiration for which we will continue patient on Zosyn for treatment.  Appreciate pulmonology evaluation and will start on Mucomyst due to some mucus plugging associated with aspiration event that may have led to lung collapse.  She also appears to be volume overloaded and will receive hemodialysis today with nephrology.  10/13: Patient appears to be doing well this morning with no nausea or vomiting, shortness of breath or chest pain.  She is currently on 2 L nasal cannula with no acute events noted overnight.  Her blood glucose is elevated and her hemoglobin is downtrending.  She is noted to have iron deficiency anemia.  Assessment & Plan:   Active Problems:   Dyspnea   Lung collapse   Acute hypoxemic respiratory failure (HCC)   Acute hypoxemic respiratory failure related to aspiration pneumonia with likely mucous plugging and lung collapse -Appreciate pulmonology evaluation with mucolytic's  added -Maintain on IV Zosyn for aspiration coverage -May be able to transfer to telemetry if okay with pulmonology -Plan to wean IV Solu-Medrol and breathing treatments per pulmonology  ESRD on HD MWF with volume overload -Appreciate nephrology evaluation and underwent hemodialysis with 1.5 L ultrafiltration on 10/12  Anemia of chronic disease-downtrending -Noted history of iron deficiency as well -We will check anemia panel and stool occult with no overt bleeding identified -Continue monitor repeat CBC   Diabetes mellitus- with steroid-induced hyperglycemia -Holding home Tresiba -Maintain on SSI every 4 hours with aggressive scale -Levemir 10 units daily to be added -We will also plan to add mealtime insulin -Diabetes coordinator recommendations appreciated  History of CAD -Hold statin with elevated liver enzymes for now -Resume home aspirin  Hypertension-soft blood pressures -Monitor closely with hemodialysis -Blood pressures are currently soft and will hold antihypertensives with the exception of metoprolol  Elevated liver enzymes-downtrending -Could be related to congestive hepatopathy -Acute hepatitis panel with no significant findings -We will follow CMP in a.m.  Prolonged QTC -Magnesium 2.2, will recheck in a.m. -Monitor on telemetry  DVT prophylaxis: SCDs Code Status: Full code Family Communication: We will plan update spouse Disposition Plan:  Plan to continue current antibiotics and wait steroids and breathing treatments per pulmonology.  Anemia evaluation and blood glucose control.  Plan to transfer to floor.   Consultants:   Pulmonology  Nephrology  Procedures:   None  Antimicrobials:  Anti-infectives (From admission, onward)   Start     Dose/Rate Route Frequency Ordered Stop   12/16/18 2300  piperacillin-tazobactam (ZOSYN) IVPB 3.375 g     3.375 g 12.5 mL/hr over 240 Minutes Intravenous Every 12 hours 12/16/18 1009     12/16/18 1630  ceFEPIme (MAXIPIME) 1 g in sodium chloride 0.9 % 100 mL IVPB  Status:  Discontinued     1 g 200 mL/hr over 30 Minutes Intravenous Every 24 hours 12/15/18 1623 12/15/18 2158   12/16/18 1200  vancomycin (VANCOCIN) IVPB 750 mg/150 ml premix  Status:  Discontinued     750 mg 150 mL/hr over 60 Minutes Intravenous Every M-W-F (Hemodialysis) 12/15/18 1621 12/15/18 2158   12/16/18 1030  piperacillin-tazobactam (ZOSYN) IVPB 3.375 g     3.375 g 100 mL/hr over 30 Minutes Intravenous  Once 12/16/18 1002 12/16/18 1138   12/15/18 1630  Vancomycin (VANCOCIN) 1,250 mg in sodium chloride 0.9 % 250 mL IVPB     1,250 mg 166.7 mL/hr over 90 Minutes Intravenous  Once 12/15/18 1620 12/15/18 1829   12/15/18 1615  ceFEPIme (MAXIPIME) 1 g in sodium chloride 0.9 % 100 mL IVPB     1 g 200 mL/hr over 30 Minutes Intravenous  Once 12/15/18 1603 12/15/18 1732       Subjective: Patient seen and evaluated today with no significant dyspnea, nausea or vomiting.  No bowel movements noted overnight.  She is noted to be hyperglycemic this morning.  Objective: Vitals:   12/17/18 0400 12/17/18 0500 12/17/18 0503 12/17/18 0600  BP: (!) 109/53 (!) 114/54  (!) 117/54  Pulse:    73  Resp: 16 13  16   Temp: 98.6 F (37 C)     TempSrc: Oral     SpO2:   100% 100%  Weight:  71.3 kg    Height:        Intake/Output Summary (Last 24 hours) at 12/17/2018 0720 Last data filed at 12/17/2018 0618 Gross per 24 hour  Intake 133.29 ml  Output 1468 ml  Net -1334.71 ml   Filed Weights   12/16/18 1230 12/16/18 1320 12/17/18 0500  Weight: 71.2 kg 71.2 kg 71.3 kg    Examination:  General exam: Appears calm and comfortable  Respiratory system: Clear to auscultation. Respiratory effort normal. Currently on 2L [place]. Cardiovascular system: S1 & S2 heard, RRR. No JVD, murmurs, rubs, gallops or clicks. No pedal edema. Gastrointestinal system: Abdomen is nondistended, soft and nontender. No organomegaly or masses felt. Normal bowel  sounds heard. Central nervous system: Alert and oriented. No focal neurological deficits. Extremities: Symmetric 5 x 5 power. Skin: No rashes, lesions or ulcers Psychiatry: Judgement and insight appear normal. Mood & affect appropriate.     Data Reviewed: I have personally reviewed following labs and imaging studies  CBC: Recent Labs  Lab 12/15/18 1229 12/16/18 0157 12/17/18 0459  WBC 12.2* 10.7* 16.6*  NEUTROABS 10.2*  --   --   HGB 8.8* 9.1* 7.8*  HCT 28.6* 30.0* 24.9*  MCV 101.8* 104.5* 102.0*  PLT 221 160 0000000*   Basic Metabolic Panel: Recent Labs  Lab 12/15/18 1229 12/15/18 2307 12/16/18 0157 12/17/18 0459  NA 138  --  137 132*  K 3.9  --  4.9 4.5  CL 98  --  99 91*  CO2 27  --  28 22  GLUCOSE 112*  --  107* 414*  BUN 39*  --  41* 32*  CREATININE 6.72*  --  7.41* 4.39*  CALCIUM 8.5*  --  8.1* 8.3*  MG  --  2.2  --   --    GFR: Estimated Creatinine Clearance: 13.5 mL/min (A) (by C-G formula based on SCr of 4.39 mg/dL (H)). Liver Function Tests: Recent Labs  Lab 12/15/18 1229 12/16/18 0157 12/17/18 0459  AST 47* 37 33  ALT 56* 45* 35  ALKPHOS 108 96 84  BILITOT 0.7 0.8 1.5*  PROT 7.0 6.5 6.3*  ALBUMIN 3.1* 2.8* 3.2*   No results for input(s): LIPASE, AMYLASE in the last 168 hours. No results for input(s): AMMONIA in the last 168 hours. Coagulation Profile: No results for input(s): INR, PROTIME in the last 168 hours. Cardiac Enzymes: No results for input(s): CKTOTAL, CKMB, CKMBINDEX, TROPONINI in the last 168 hours. BNP (last 3 results) No results for input(s): PROBNP in the last 8760 hours. HbA1C: No results for input(s): HGBA1C in the last 72 hours. CBG: Recent Labs  Lab 12/16/18 0755 12/16/18 1223 12/16/18 1545 12/16/18 2104 12/16/18 2338  GLUCAP 116* 213* 170* 310* 289*   Lipid Profile: Recent Labs    12/15/18 1229  TRIG 95   Thyroid Function Tests: No results for input(s): TSH, T4TOTAL, FREET4, T3FREE, THYROIDAB in the last 72  hours. Anemia Panel: Recent Labs    12/15/18 1229  FERRITIN 1,064*   Sepsis Labs: Recent Labs  Lab 12/15/18 1229 12/15/18 1526  PROCALCITON 1.15  --   LATICACIDVEN 1.8 1.2    Recent Results (from the past 240 hour(s))  SARS Coronavirus 2 by RT PCR (hospital order, performed in [facility] hospital lab) Nasopharyngeal Nasopharyngeal Swab     Status: None   Collection Time: 12/15/18  1:00 PM   Specimen: Nasopharyngeal Swab  Result Value Ref Range Status   SARS Coronavirus 2 NEGATIVE NEGATIVE Final    Comment: (NOTE) If result is NEGATIVE SARS-CoV-2 target nucleic acids are NOT DETECTED. The SARS-CoV-2 RNA is generally detectable in upper and lower  respiratory specimens during the acute phase of infection. The lowest  concentration of SARS-CoV-2 viral copies this assay can detect is 250  copies / mL. A negative result does not preclude SARS-CoV-2 infection  and should not be used as the sole basis for treatment or other  patient management decisions.  A negative result may occur with  improper specimen collection / handling, submission of specimen other  than nasopharyngeal swab, presence of viral mutation(s) within the  areas targeted by this assay, and inadequate number of viral copies  (<250 copies / mL). A negative result must be combined with clinical  observations, patient history, and epidemiological information. If result is POSITIVE SARS-CoV-2 target nucleic acids are DETECTED. The SARS-CoV-2 RNA is generally detectable in upper and lower  respiratory specimens dur ing the acute phase of infection.  Positive  results are indicative of active infection with SARS-CoV-2.  Clinical  correlation with patient history and other diagnostic information is  necessary to determine patient infection status.  Positive results do  not rule out bacterial infection or co-infection with other viruses. If result is PRESUMPTIVE POSTIVE SARS-CoV-2 nucleic acids MAY BE PRESENT.   A  presumptive positive result was obtained on the submitted specimen  and confirmed on repeat testing.  While 2019 novel coronavirus  (SARS-CoV-2) nucleic acids may be present in the submitted sample  additional confirmatory testing may be necessary for epidemiological  and / or clinical management purposes  to differentiate between  SARS-CoV-2 and other Sarbecovirus currently known to infect humans.  If clinically indicated additional testing with an alternate test  methodology [id number]) is advised. The SARS-CoV-2 RNA is generally  detectable in upper and lower respiratory sp ecimens during the acute  phase of infection. The expected result is Negative. Fact Sheet for Patients:  StrictlyIdeas.no Fact Sheet for Healthcare Providers: BankingDealers.co.za This test  is not yet approved or cleared by the [place] and has been authorized for detection and/or diagnosis of SARS-CoV-2 by FDA under an Emergency Use Authorization (EUA).  This EUA will remain in effect (meaning this test can be used) for the duration of the COVID-19 declaration under Section 564(b)(1) of the Act, 21 U.S.C. section 360bbb-3(b)(1), unless the authorization is terminated or revoked sooner. Performed at [facility], [address], [place], [place] [address]   Blood Culture (routine x 2)     Status: None (Preliminary result)   Collection Time: 12/15/18  2:11 PM   Specimen: BLOOD RIGHT FOREARM  Result Value Ref Range Status   Specimen Description   Final    BLOOD RIGHT FOREARM BOTTLES DRAWN AEROBIC AND ANAEROBIC   Special Requests Blood Culture adequate volume  Final   Culture   Final    NO GROWTH 2 DAYS Performed at [facility], [address] [place], [place] [address]    Report Status PENDING  Incomplete  Blood Culture (routine x 2)     Status: None (Preliminary result)   Collection Time: 12/15/18  3:26 PM   Specimen: Right Antecubital; Blood  Result  Value Ref Range Status   Specimen Description   Final    RIGHT ANTECUBITAL BOTTLES DRAWN AEROBIC AND ANAEROBIC   Special Requests Blood Culture adequate volume  Final   Culture   Final    NO GROWTH 2 DAYS Performed at [facility], [address] [place], [place] [address]    Report Status PENDING  Incomplete         Radiology Studies: Ct Angio Chest Pe W/cm &/or Wo Cm  Result Date: 12/15/2018 CLINICAL DATA:  Acute shortness of breath and hypoxia. Dialysis patient. COVID-19 negative. EXAM: CT ANGIOGRAPHY CHEST WITH CONTRAST TECHNIQUE: Multidetector CT imaging of the chest was performed using the standard protocol during bolus administration of intravenous contrast. Multiplanar CT image reconstructions and MIPs were obtained to evaluate the vascular anatomy. CONTRAST:  134mL OMNIPAQUE IOHEXOL 350 MG/ML SOLN COMPARISON:  Chest x-ray from same day. CT chest report dated September 04, 2006. FINDINGS: Cardiovascular: Satisfactory opacification of the pulmonary arteries to the segmental level. No evidence of pulmonary embolism. Mild cardiomegaly. No pericardial effusion. No thoracic aortic aneurysm or dissection. Coronary, aortic arch, and branch vessel atherosclerotic vascular disease. Mediastinum/Nodes: Mildly enlarged subcarinal node measuring 1.3 cm in short axis, likely reactive. No additional enlarged mediastinal, hilar, or axillary lymph nodes. The thyroid gland, trachea, and esophagus demonstrate no significant findings. Lungs/Pleura: Small to moderate bilateral pleural effusions. Complete collapse of the left lower lobe. Partial collapse of the right lower lobe. Diffuse interlobular septal thickening throughout both lungs. Small area of peribronchovascular ground-glass density in the central right upper lobe. Mild subsegmental atelectasis in the dependent left upper and right middle lobes. No consolidation or pneumothorax. No suspicious pulmonary nodule. Upper Abdomen: No acute abnormality.  Musculoskeletal: No chest wall abnormality. No acute or significant osseous findings. Multiple chronic mild thoracic compression deformities. Review of the MIP images confirms the above findings. IMPRESSION: 1.  No evidence of pulmonary embolism. 2. Findings consistent with congestive heart failure. Small to moderate bilateral pleural effusions with diffuse interstitial pulmonary edema. Small area of alveolar pulmonary edema in the central right upper lobe. 3. Complete collapse of the left lower lobe. Partial collapse of the right lower lobe. 4.  Aortic atherosclerosis (ICD10-I70.0). Electronically Signed   By: Titus Dubin M.D.   On: 12/15/2018 19:23   Dg Chest [facility]  1 View  Result Date: 12/15/2018 CLINICAL DATA:  Pt reports increasing sob starting around 2am. Last dialysis on Friday and wasn't feeling well then and almost passed out EXAM: PORTABLE CHEST 1 VIEW COMPARISON:  Chest radiograph 12/02/2018 FINDINGS: Stable cardiomediastinal contours with enlarged heart size. Aortic arch calcification. There are diffuse bilateral interstitial opacities which could represent atypical infection or edema. No pneumothorax or large pleural effusion. No acute finding in the visualized skeleton. IMPRESSION: Diffuse bilateral interstitial opacities which could represent atypical infection or edema. Electronically Signed   By: Audie Pinto M.D.   On: 12/15/2018 13:03        Scheduled Meds: . acetylcysteine  3 mL Nebulization Q4H  . Chlorhexidine Gluconate Cloth  6 each Topical Q0600  . guaiFENesin  1,200 mg Oral BID  . insulin aspart  0-20 Units Subcutaneous Q4H  . insulin detemir  10 Units Subcutaneous Daily  . ipratropium-albuterol  3 mL Nebulization Q4H  . methylPREDNISolone (SOLU-MEDROL) injection  40 mg Intravenous Q12H  . metoprolol tartrate  12.5 mg Oral BID  . sevelamer carbonate  3,200 mg Oral TID WC  . tacrolimus  0.5 mg Oral q morning - 10a   Continuous Infusions: . sodium chloride    .  sodium chloride    . piperacillin-tazobactam (ZOSYN)  IV Stopped (12/17/18 0130)     LOS: 2 days    Time spent: 30 minutes    Jadin Creque Darleen Crocker, DO [facility] Pager [phone]  If 7PM-7AM, please contact night-coverage [email or web address] Password [email or web address] 12/17/2018, 7:20 AM

## 2018-12-17 NOTE — Progress Notes (Signed)
Inpatient Diabetes Program Recommendations  AACE/ADA: New Consensus Statement on Inpatient Glycemic Control (2015)  Target Ranges:  Prepandial:   less than 140 mg/dL      Peak postprandial:   less than 180 mg/dL (1-2 hours)      Critically ill patients:  140 - 180 mg/dL   Results for TENEIL, KNAPPE (MRN [id number]) as of 12/17/2018 07:51  Ref. Range 12/16/2018 00:40 12/16/2018 03:44 12/16/2018 07:55 12/16/2018 12:23 12/16/2018 15:45 12/16/2018 21:04  Glucose-Capillary Latest Ref Range: 70 - 99 mg/dL 88 97 116 (H) 213 (H)  3 units NOVOLOG  170 (H)  2 units NOVOLOG  310 (H)  7 units NOVOLOG    Results for TRAYONNA, PANZER (MRN [id number]) as of 12/17/2018 07:51  Ref. Range 12/16/2018 23:38 12/17/2018 07:32  Glucose-Capillary Latest Ref Range: 70 - 99 mg/dL 289 (H)  5 units NOVOLOG  467 (H)    Admit with: SOB/ Lung Collapse/ Decompensated CHF  History: DM, ESRD, CHF  Home DM Meds: Tresiba 10 units QHS       Trulicity 1.5 mg Qweek  Current Orders: Levemir 10 units Daily      Novolog Resistant Correction Scale/ SSI (0-20 units) Q4 hours      Novolog 3 units TID with meals    Was Getting Solumedrol 40 mg BID--Last dose given yesterday at 6:45pm.  Now getting Prednisone 40 mg Daily.    MD- Note Insulin adjustments made this AM (Levemir to start this AM, Novolog SSi increased to the Resistant scale, and Novolog Meal Coverage to start this AM).  Agree with all adjustments.  Hopeful CBGs will trend down as Solumedrol leaves pt's system.  Will follow daily.     --Will follow patient during hospitalization--  Wyn Quaker RN, MSN, CDE Diabetes Coordinator Inpatient Glycemic Control Team Team Pager: [phone] (8a-5p)

## 2018-12-17 NOTE — Progress Notes (Signed)
Subjective: She says she feels much better.  Oxygenation is much better.  She had hemodialysis yesterday that also seems to have improved her situation.  Objective: Vital signs in last 24 hours: Temp:  [97.6 F (36.4 C)-98.6 F (37 C)] 98.2 F (36.8 C) (10/13 0735) Pulse Rate:  [70-85] 73 (10/13 0600) Resp:  [12-31] 16 (10/13 0600) BP: (80-133)/(40-67) 117/54 (10/13 0600) SpO2:  [95 %-100 %] 100 % (10/13 0600) Weight:  [71.2 kg-71.3 kg] 71.3 kg (10/13 0500) Weight change: 3.16 kg    Intake/Output from previous day: 10/12 0701 - 10/13 0700 In: 133.3 [P.O.:60; IV Piggyback:73.3] Out: 1468   PHYSICAL EXAM General appearance: alert, cooperative and no distress Resp: rhonchi bilaterally Cardio: regular rate and rhythm, S1, S2 normal, no murmur, click, rub or gallop GI: soft, non-tender; bowel sounds normal; no masses,  no organomegaly Extremities: extremities normal, atraumatic, no cyanosis or edema  Lab Results:  Results for orders placed or performed during the hospital encounter of 12/15/18 (from the past 48 hour(s))  Comprehensive metabolic panel     Status: Abnormal   Collection Time: 12/15/18 12:29 PM  Result Value Ref Range   Sodium 138 135 - 145 mmol/L   Potassium 3.9 3.5 - 5.1 mmol/L   Chloride 98 98 - 111 mmol/L   CO2 27 22 - 32 mmol/L   Glucose, Bld 112 (H) 70 - 99 mg/dL   BUN 39 (H) 8 - 23 mg/dL   Creatinine, Ser 6.72 (H) 0.44 - 1.00 mg/dL   Calcium 8.5 (L) 8.9 - 10.3 mg/dL   Total Protein 7.0 6.5 - 8.1 g/dL   Albumin 3.1 (L) 3.5 - 5.0 g/dL   AST 47 (H) 15 - 41 U/L   ALT 56 (H) 0 - 44 U/L   Alkaline Phosphatase 108 38 - 126 U/L   Total Bilirubin 0.7 0.3 - 1.2 mg/dL   GFR calc non Af Amer 6 (L) >60 mL/min   GFR calc Af Amer 7 (L) >60 mL/min   Anion gap 13 5 - 15    Comment: Performed at [facility], [address] [place], [place] [address]  CBC with Differential/Platelet     Status: Abnormal   Collection Time: 12/15/18 12:29 PM  Result Value Ref Range   WBC 12.2 (H) 4.0 - 10.5 K/uL   RBC 2.81 (L) 3.87 - 5.11 MIL/uL   Hemoglobin 8.8 (L) 12.0 - 15.0 g/dL   HCT 28.6 (L) 36.0 - 46.0 %   MCV 101.8 (H) 80.0 - 100.0 fL   MCH 31.3 26.0 - 34.0 pg   MCHC 30.8 30.0 - 36.0 g/dL   RDW 13.7 11.5 - 15.5 %   Platelets 221 150 - 400 K/uL   nRBC 0.0 0.0 - 0.2 %   Neutrophils Relative % 83 %   Neutro Abs 10.2 (H) 1.7 - 7.7 K/uL   Lymphocytes Relative 7 %   Lymphs Abs 0.9 0.7 - 4.0 K/uL   Monocytes Relative 5 %   Monocytes Absolute 0.6 0.1 - 1.0 K/uL   Eosinophils Relative 3 %   Eosinophils Absolute 0.4 0.0 - 0.5 K/uL   Basophils Relative 1 %   Basophils Absolute 0.1 0.0 - 0.1 K/uL   Immature Granulocytes 1 %   Abs Immature Granulocytes 0.07 0.00 - 0.07 K/uL    Comment: Performed at [facility], [address] [place], [place] [address]  Lactic acid, plasma     Status: None   Collection Time: 12/15/18 12:29 PM  Result Value  Ref Range   Lactic Acid, Venous 1.8 0.5 - 1.9 mmol/L    Comment: Performed at [facility], [address], [place], [place] [address]  D-dimer, quantitative     Status: Abnormal   Collection Time: 12/15/18 12:29 PM  Result Value Ref Range   D-Dimer, Quant 1.57 (H) 0.00 - 0.50 ug/mL-FEU    Comment: (NOTE) At the manufacturer cut-off of 0.50 ug/mL FEU, this assay has been documented to exclude PE with a sensitivity and negative predictive value of 97 to 99%.  At this time, this assay has not been approved by the FDA to exclude DVT/VTE. Results should be correlated with clinical presentation. Performed at [facility], [address] [place], [place] [address]   Procalcitonin     Status: None   Collection Time: 12/15/18 12:29 PM  Result Value Ref Range   Procalcitonin 1.15 ng/mL    Comment:        Interpretation: PCT > 0.5 ng/mL and <= 2 ng/mL: Systemic infection (sepsis) is possible, but other conditions are known to elevate PCT as well. (NOTE)       Sepsis PCT Algorithm           Lower Respiratory Tract                                       Infection PCT Algorithm    ----------------------------     ----------------------------         PCT < 0.25 ng/mL                PCT < 0.10 ng/mL         Strongly encourage             Strongly discourage   discontinuation of antibiotics    initiation of antibiotics    ----------------------------     -----------------------------       PCT 0.25 - 0.50 ng/mL            PCT 0.10 - 0.25 ng/mL               OR       >80% decrease in PCT            Discourage initiation of                                            antibiotics      Encourage discontinuation           of antibiotics    ----------------------------     -----------------------------         PCT >= 0.50 ng/mL              PCT 0.26 - 0.50 ng/mL                AND       <80% decrease in PCT             Encourage initiation of                                             antibiotics       Encourage continuation  of antibiotics    ----------------------------     -----------------------------        PCT >= 0.50 ng/mL                  PCT > 0.50 ng/mL               AND         increase in PCT                  Strongly encourage                                      initiation of antibiotics    Strongly encourage escalation           of antibiotics                                     -----------------------------                                           PCT <= 0.25 ng/mL                                                 OR                                        > 80% decrease in PCT                                     Discontinue / Do not initiate                                             antibiotics Performed at [facility], [address] [place], [place] [address]   Lactate dehydrogenase     Status: Abnormal   Collection Time: 12/15/18 12:29 PM  Result Value Ref Range   LDH 194 (H) 98 - 192 U/L    Comment: Performed at [facility], [address], [place], [place] [address]  Ferritin      Status: Abnormal   Collection Time: 12/15/18 12:29 PM  Result Value Ref Range   Ferritin 1,064 (H) 11 - 307 ng/mL    Comment: Performed at [facility], [address], [place], [place] [address]  Triglycerides     Status: None   Collection Time: 12/15/18 12:29 PM  Result Value Ref Range   Triglycerides 95 <150 mg/dL    Comment: Performed at [facility], [address], [place], [place] [address]  Fibrinogen     Status: None   Collection Time: 12/15/18 12:29 PM  Result Value Ref Range   Fibrinogen 467 210 - 475 mg/dL    Comment: Performed at [facility], [address] [place], [place] [address]  C-reactive protein  Status: Abnormal   Collection Time: 12/15/18 12:29 PM  Result Value Ref Range   CRP 5.7 (H) <1.0 mg/dL    Comment: RESULTS CONFIRMED BY MANUAL DILUTION Performed at [facility], [address] [place], [place] [address]   Brain natriuretic peptide     Status: Abnormal   Collection Time: 12/15/18 12:29 PM  Result Value Ref Range   B Natriuretic Peptide >4,500.0 (H) 0.0 - 100.0 pg/mL    Comment: Performed at [facility], [address], [place], [place] [address]  SARS Coronavirus 2 by RT PCR (hospital order, performed in [facility] hospital lab) Nasopharyngeal Nasopharyngeal Swab     Status: None   Collection Time: 12/15/18  1:00 PM   Specimen: Nasopharyngeal Swab  Result Value Ref Range   SARS Coronavirus 2 NEGATIVE NEGATIVE    Comment: (NOTE) If result is NEGATIVE SARS-CoV-2 target nucleic acids are NOT DETECTED. The SARS-CoV-2 RNA is generally detectable in upper and lower  respiratory specimens during the acute phase of infection. The lowest  concentration of SARS-CoV-2 viral copies this assay can detect is 250  copies / mL. A negative result does not preclude SARS-CoV-2 infection  and should not be used as the sole basis for treatment or other  patient management decisions.  A negative result may occur with  improper specimen collection / handling,  submission of specimen other  than nasopharyngeal swab, presence of viral mutation(s) within the  areas targeted by this assay, and inadequate number of viral copies  (<250 copies / mL). A negative result must be combined with clinical  observations, patient history, and epidemiological information. If result is POSITIVE SARS-CoV-2 target nucleic acids are DETECTED. The SARS-CoV-2 RNA is generally detectable in upper and lower  respiratory specimens dur ing the acute phase of infection.  Positive  results are indicative of active infection with SARS-CoV-2.  Clinical  correlation with patient history and other diagnostic information is  necessary to determine patient infection status.  Positive results do  not rule out bacterial infection or co-infection with other viruses. If result is PRESUMPTIVE POSTIVE SARS-CoV-2 nucleic acids MAY BE PRESENT.   A presumptive positive result was obtained on the submitted specimen  and confirmed on repeat testing.  While 2019 novel coronavirus  (SARS-CoV-2) nucleic acids may be present in the submitted sample  additional confirmatory testing may be necessary for epidemiological  and / or clinical management purposes  to differentiate between  SARS-CoV-2 and other Sarbecovirus currently known to infect humans.  If clinically indicated additional testing with an alternate test  methodology [phone]) is advised. The SARS-CoV-2 RNA is generally  detectable in upper and lower respiratory sp ecimens during the acute  phase of infection. The expected result is Negative. Fact Sheet for Patients:  StrictlyIdeas.no Fact Sheet for Healthcare Providers: BankingDealers.co.za This test is not yet approved or cleared by the [place] FDA and has been authorized for detection and/or diagnosis of SARS-CoV-2 by FDA under an Emergency Use Authorization (EUA).  This EUA will remain in effect (meaning this test can be  used) for the duration of the COVID-19 declaration under Section 564(b)(1) of the Act, 21 U.S.C. section 360bbb-3(b)(1), unless the authorization is terminated or revoked sooner. Performed at [facility], [address], [place], [place] [address]   Blood Culture (routine x 2)     Status: None (Preliminary result)   Collection Time: 12/15/18  2:11 PM   Specimen: BLOOD RIGHT FOREARM  Result Value Ref Range   Specimen Description  BLOOD RIGHT FOREARM BOTTLES DRAWN AEROBIC AND ANAEROBIC   Special Requests Blood Culture adequate volume    Culture      NO GROWTH 2 DAYS Performed at [facility], [address] [place], [place] [address]    Report Status PENDING   Lactic acid, plasma     Status: None   Collection Time: 12/15/18  3:26 PM  Result Value Ref Range   Lactic Acid, Venous 1.2 0.5 - 1.9 mmol/L    Comment: Performed at [facility], [address] [place], [place] [address]  Blood Culture (routine x 2)     Status: None (Preliminary result)   Collection Time: 12/15/18  3:26 PM   Specimen: Right Antecubital; Blood  Result Value Ref Range   Specimen Description      RIGHT ANTECUBITAL BOTTLES DRAWN AEROBIC AND ANAEROBIC   Special Requests Blood Culture adequate volume    Culture      NO GROWTH 2 DAYS Performed at [facility], [address], [place], [place] [address]    Report Status PENDING   Troponin I (High Sensitivity)     Status: Abnormal   Collection Time: 12/15/18 11:07 PM  Result Value Ref Range   Troponin I (High Sensitivity) 89 (H) <18 ng/L    Comment: (NOTE) Elevated high sensitivity troponin I (hsTnI) values and significant  changes across serial measurements may suggest ACS but many other  chronic and acute conditions are known to elevate hsTnI results.  Refer to the "Links" section for chest pain algorithms and additional  guidance. Performed at [facility], [address], [place], [place] [address]   Magnesium     Status: None   Collection Time:  12/15/18 11:07 PM  Result Value Ref Range   Magnesium 2.2 1.7 - 2.4 mg/dL    Comment: Performed at [facility], [address], [place], [place] [address]  CBG monitoring, ED     Status: None   Collection Time: 12/16/18 12:40 AM  Result Value Ref Range   Glucose-Capillary 88 70 - 99 mg/dL  Troponin I (High Sensitivity)     Status: Abnormal   Collection Time: 12/16/18  1:57 AM  Result Value Ref Range   Troponin I (High Sensitivity) 94 (H) <18 ng/L    Comment: (NOTE) Elevated high sensitivity troponin I (hsTnI) values and significant  changes across serial measurements may suggest ACS but many other  chronic and acute conditions are known to elevate hsTnI results.  Refer to the "Links" section for chest pain algorithms and additional  guidance. Performed at [facility], [address] [place], [place] [address]   CBC     Status: Abnormal   Collection Time: 12/16/18  1:57 AM  Result Value Ref Range   WBC 10.7 (H) 4.0 - 10.5 K/uL   RBC 2.87 (L) 3.87 - 5.11 MIL/uL   Hemoglobin 9.1 (L) 12.0 - 15.0 g/dL   HCT 30.0 (L) 36.0 - 46.0 %   MCV 104.5 (H) 80.0 - 100.0 fL   MCH 31.7 26.0 - 34.0 pg   MCHC 30.3 30.0 - 36.0 g/dL   RDW 13.8 11.5 - 15.5 %   Platelets 160 150 - 400 K/uL   nRBC 0.0 0.0 - 0.2 %    Comment: Performed at [facility], [address], [place], [place] [address]  Comprehensive metabolic panel     Status: Abnormal   Collection Time: 12/16/18  1:57 AM  Result Value Ref Range   Sodium 137 135 - 145 mmol/L  Potassium 4.9 3.5 - 5.1 mmol/L    Comment: DELTA CHECK NOTED   Chloride 99 98 - 111 mmol/L   CO2 28 22 - 32 mmol/L   Glucose, Bld 107 (H) 70 - 99 mg/dL   BUN 41 (H) 8 - 23 mg/dL   Creatinine, Ser 7.41 (H) 0.44 - 1.00 mg/dL   Calcium 8.1 (L) 8.9 - 10.3 mg/dL   Total Protein 6.5 6.5 - 8.1 g/dL   Albumin 2.8 (L) 3.5 - 5.0 g/dL   AST 37 15 - 41 U/L   ALT 45 (H) 0 - 44 U/L   Alkaline Phosphatase 96 38 - 126 U/L   Total Bilirubin 0.8 0.3 - 1.2 mg/dL   GFR calc non  Af Amer 5 (L) >60 mL/min   GFR calc Af Amer 6 (L) >60 mL/min   Anion gap 10 5 - 15    Comment: Performed at [facility], [address], [place], [place] [address]  Hepatitis panel, acute     Status: None   Collection Time: 12/16/18  1:57 AM  Result Value Ref Range   Hepatitis B Surface Ag NON REACTIVE NON REACTIVE   HCV Ab NON REACTIVE NON REACTIVE    Comment: (NOTE) Nonreactive HCV antibody screen is consistent with no HCV infections,  unless recent infection is suspected or other evidence exists to indicate HCV infection.    Hep A IgM NON REACTIVE NON REACTIVE   Hep B C IgM NON REACTIVE NON REACTIVE    Comment: Performed at [facility], [place] [address], [place], [place] [address]  CBG monitoring, ED     Status: None   Collection Time: 12/16/18  3:44 AM  Result Value Ref Range   Glucose-Capillary 97 70 - 99 mg/dL  CBG monitoring, ED     Status: Abnormal   Collection Time: 12/16/18  7:55 AM  Result Value Ref Range   Glucose-Capillary 116 (H) 70 - 99 mg/dL  Glucose, capillary     Status: Abnormal   Collection Time: 12/16/18 12:23 PM  Result Value Ref Range   Glucose-Capillary 213 (H) 70 - 99 mg/dL  Glucose, capillary     Status: Abnormal   Collection Time: 12/16/18  3:45 PM  Result Value Ref Range   Glucose-Capillary 170 (H) 70 - 99 mg/dL  Glucose, capillary     Status: Abnormal   Collection Time: 12/16/18  9:04 PM  Result Value Ref Range   Glucose-Capillary 310 (H) 70 - 99 mg/dL  Glucose, capillary     Status: Abnormal   Collection Time: 12/16/18 11:38 PM  Result Value Ref Range   Glucose-Capillary 289 (H) 70 - 99 mg/dL  CBC     Status: Abnormal   Collection Time: 12/17/18  4:59 AM  Result Value Ref Range   WBC 16.6 (H) 4.0 - 10.5 K/uL   RBC 2.44 (L) 3.87 - 5.11 MIL/uL   Hemoglobin 7.8 (L) 12.0 - 15.0 g/dL   HCT 24.9 (L) 36.0 - 46.0 %   MCV 102.0 (H) 80.0 - 100.0 fL   MCH 32.0 26.0 - 34.0 pg   MCHC 31.3 30.0 - 36.0 g/dL   RDW 13.4 11.5 - 15.5 %   Platelets  137 (L) 150 - 400 K/uL   nRBC 0.0 0.0 - 0.2 %    Comment: Performed at [facility], [address] [place], [place] [address]  Comprehensive metabolic panel     Status: Abnormal   Collection Time: 12/17/18  4:59 AM  Result  Value Ref Range   Sodium 132 (L) 135 - 145 mmol/L   Potassium 4.5 3.5 - 5.1 mmol/L   Chloride 91 (L) 98 - 111 mmol/L   CO2 22 22 - 32 mmol/L   Glucose, Bld 414 (H) 70 - 99 mg/dL   BUN 32 (H) 8 - 23 mg/dL   Creatinine, Ser 4.39 (H) 0.44 - 1.00 mg/dL    Comment: DELTA CHECK NOTED   Calcium 8.3 (L) 8.9 - 10.3 mg/dL   Total Protein 6.3 (L) 6.5 - 8.1 g/dL   Albumin 3.2 (L) 3.5 - 5.0 g/dL   AST 33 15 - 41 U/L   ALT 35 0 - 44 U/L   Alkaline Phosphatase 84 38 - 126 U/L   Total Bilirubin 1.5 (H) 0.3 - 1.2 mg/dL   GFR calc non Af Amer 10 (L) >60 mL/min   GFR calc Af Amer 12 (L) >60 mL/min   Anion gap 19 (H) 5 - 15    Comment: Performed at [facility], [address] [place], [place] [address]  Glucose, capillary     Status: Abnormal   Collection Time: 12/17/18  7:32 AM  Result Value Ref Range   Glucose-Capillary 467 (H) 70 - 99 mg/dL    ABGS No results for input(s): PHART, PO2ART, TCO2, HCO3 in the last 72 hours.  Invalid input(s): PCO2 CULTURES Recent Results (from the past 240 hour(s))  SARS Coronavirus 2 by RT PCR (hospital order, performed in [facility] hospital lab) Nasopharyngeal Nasopharyngeal Swab     Status: None   Collection Time: 12/15/18  1:00 PM   Specimen: Nasopharyngeal Swab  Result Value Ref Range Status   SARS Coronavirus 2 NEGATIVE NEGATIVE Final    Comment: (NOTE) If result is NEGATIVE SARS-CoV-2 target nucleic acids are NOT DETECTED. The SARS-CoV-2 RNA is generally detectable in upper and lower  respiratory specimens during the acute phase of infection. The lowest  concentration of SARS-CoV-2 viral copies this assay can detect is 250  copies / mL. A negative result does not preclude SARS-CoV-2 infection  and should not be used as the  sole basis for treatment or other  patient management decisions.  A negative result may occur with  improper specimen collection / handling, submission of specimen other  than nasopharyngeal swab, presence of viral mutation(s) within the  areas targeted by this assay, and inadequate number of viral copies  (<250 copies / mL). A negative result must be combined with clinical  observations, patient history, and epidemiological information. If result is POSITIVE SARS-CoV-2 target nucleic acids are DETECTED. The SARS-CoV-2 RNA is generally detectable in upper and lower  respiratory specimens dur ing the acute phase of infection.  Positive  results are indicative of active infection with SARS-CoV-2.  Clinical  correlation with patient history and other diagnostic information is  necessary to determine patient infection status.  Positive results do  not rule out bacterial infection or co-infection with other viruses. If result is PRESUMPTIVE POSTIVE SARS-CoV-2 nucleic acids MAY BE PRESENT.   A presumptive positive result was obtained on the submitted specimen  and confirmed on repeat testing.  While 2019 novel coronavirus  (SARS-CoV-2) nucleic acids may be present in the submitted sample  additional confirmatory testing may be necessary for epidemiological  and / or clinical management purposes  to differentiate between  SARS-CoV-2 and other Sarbecovirus currently known to infect humans.  If clinically indicated additional testing with an alternate test  methodology [phone]) is advised. The SARS-CoV-2 RNA is  generally  detectable in upper and lower respiratory sp ecimens during the acute  phase of infection. The expected result is Negative. Fact Sheet for Patients:  StrictlyIdeas.no Fact Sheet for Healthcare Providers: BankingDealers.co.za This test is not yet approved or cleared by the [place] FDA and has been authorized for detection  and/or diagnosis of SARS-CoV-2 by FDA under an Emergency Use Authorization (EUA).  This EUA will remain in effect (meaning this test can be used) for the duration of the COVID-19 declaration under Section 564(b)(1) of the Act, 21 U.S.C. section 360bbb-3(b)(1), unless the authorization is terminated or revoked sooner. Performed at [facility], [address], [place], [place] [address]   Blood Culture (routine x 2)     Status: None (Preliminary result)   Collection Time: 12/15/18  2:11 PM   Specimen: BLOOD RIGHT FOREARM  Result Value Ref Range Status   Specimen Description   Final    BLOOD RIGHT FOREARM BOTTLES DRAWN AEROBIC AND ANAEROBIC   Special Requests Blood Culture adequate volume  Final   Culture   Final    NO GROWTH 2 DAYS Performed at [facility], [address], [place], [place] [address]    Report Status PENDING  Incomplete  Blood Culture (routine x 2)     Status: None (Preliminary result)   Collection Time: 12/15/18  3:26 PM   Specimen: Right Antecubital; Blood  Result Value Ref Range Status   Specimen Description   Final    RIGHT ANTECUBITAL BOTTLES DRAWN AEROBIC AND ANAEROBIC   Special Requests Blood Culture adequate volume  Final   Culture   Final    NO GROWTH 2 DAYS Performed at [facility], [address] [place], [place] [address]    Report Status PENDING  Incomplete   Studies/Results: Ct Angio Chest Pe W/cm &/or Wo Cm  Result Date: 12/15/2018 CLINICAL DATA:  Acute shortness of breath and hypoxia. Dialysis patient. COVID-19 negative. EXAM: CT ANGIOGRAPHY CHEST WITH CONTRAST TECHNIQUE: Multidetector CT imaging of the chest was performed using the standard protocol during bolus administration of intravenous contrast. Multiplanar CT image reconstructions and MIPs were obtained to evaluate the vascular anatomy. CONTRAST:  196mL OMNIPAQUE IOHEXOL 350 MG/ML SOLN COMPARISON:  Chest x-ray from same day. CT chest report dated September 04, 2006. FINDINGS: Cardiovascular:  Satisfactory opacification of the pulmonary arteries to the segmental level. No evidence of pulmonary embolism. Mild cardiomegaly. No pericardial effusion. No thoracic aortic aneurysm or dissection. Coronary, aortic arch, and branch vessel atherosclerotic vascular disease. Mediastinum/Nodes: Mildly enlarged subcarinal node measuring 1.3 cm in short axis, likely reactive. No additional enlarged mediastinal, hilar, or axillary lymph nodes. The thyroid gland, trachea, and esophagus demonstrate no significant findings. Lungs/Pleura: Small to moderate bilateral pleural effusions. Complete collapse of the left lower lobe. Partial collapse of the right lower lobe. Diffuse interlobular septal thickening throughout both lungs. Small area of peribronchovascular ground-glass density in the central right upper lobe. Mild subsegmental atelectasis in the dependent left upper and right middle lobes. No consolidation or pneumothorax. No suspicious pulmonary nodule. Upper Abdomen: No acute abnormality. Musculoskeletal: No chest wall abnormality. No acute or significant osseous findings. Multiple chronic mild thoracic compression deformities. Review of the MIP images confirms the above findings. IMPRESSION: 1.  No evidence of pulmonary embolism. 2. Findings consistent with congestive heart failure. Small to moderate bilateral pleural effusions with diffuse interstitial pulmonary edema. Small area of alveolar pulmonary edema in the central right upper lobe. 3. Complete collapse of the left lower lobe. Partial collapse  of the right lower lobe. 4.  Aortic atherosclerosis (ICD10-I70.0). Electronically Signed   By: Titus Dubin M.D.   On: 12/15/2018 19:23   Dg Chest Port 1 View  Result Date: 12/15/2018 CLINICAL DATA:  Pt reports increasing sob starting around 2am. Last dialysis on Friday and wasn't feeling well then and almost passed out EXAM: PORTABLE CHEST 1 VIEW COMPARISON:  Chest radiograph 12/02/2018 FINDINGS: Stable  cardiomediastinal contours with enlarged heart size. Aortic arch calcification. There are diffuse bilateral interstitial opacities which could represent atypical infection or edema. No pneumothorax or large pleural effusion. No acute finding in the visualized skeleton. IMPRESSION: Diffuse bilateral interstitial opacities which could represent atypical infection or edema. Electronically Signed   By: Audie Pinto M.D.   On: 12/15/2018 13:03    Medications:  Prior to Admission:  Medications Prior to Admission  Medication Sig Dispense Refill Last Dose  . acetaminophen (TYLENOL) 500 MG tablet Take 500 mg by mouth every 6 (six) hours as needed for moderate pain or headache.      Marland Kitchen amLODipine (NORVASC) 5 MG tablet Take 1 tablet by mouth nightly (Patient taking differently: Take 5 mg by mouth at bedtime. ) 90 tablet 0 12/14/2018 at Unknown time  . aspirin (ASPIR-LOW) 81 MG EC tablet Take 81 mg by mouth daily.    12/14/2018 at 930  . B Complex-C-Folic Acid (RENA-VITE RX) 1 MG TABS Take 1 tablet by mouth daily.   12/14/2018 at Unknown time  . insulin degludec (TRESIBA FLEXTOUCH) 100 UNIT/ML SOPN FlexTouch Pen Inject 0.1 mLs (10 Units total) into the skin daily at 10 pm. 12 pen 2 12/14/2018 at Unknown time  . isosorbide mononitrate (IMDUR) 60 MG 24 hr tablet Take 1 tablet by mouth once daily (Patient taking differently: Take 60 mg by mouth daily. ) 90 tablet 0 12/14/2018 at Unknown time  . lidocaine-prilocaine (EMLA) cream Apply 1 application topically as needed (for dialysis).   0   . meclizine (ANTIVERT) 25 MG tablet Take 1 tablet (25 mg total) by mouth 2 (two) times daily as needed for dizziness. 30 tablet 0 12/14/2018 at Unknown time  . metoprolol tartrate (LOPRESSOR) 25 MG tablet Take 1/2 (one-half) tablet by mouth twice daily (Patient taking differently: Take 12.5 mg by mouth 2 (two) times daily. ) 90 tablet 1 12/14/2018 at 2100  . ondansetron (ZOFRAN ODT) 4 MG disintegrating tablet 4mg  ODT q4 hours  prn nausea/vomit (Patient taking differently: Take 4 mg by mouth 2 (two) times daily. 4mg  ODT q4 hours prn nausea/vomit) 12 tablet 0   . predniSONE (DELTASONE) 5 MG tablet TAKE 1 TABLET BY MOUTH ONCE DAILY WITH BREAKFAST 90 tablet 0 12/14/2018 at Unknown time  . PROGRAF 0.5 MG capsule Take 0.5 mg by mouth daily. Take 0.5mg  by mouth each morning.  0 12/14/2018 at Unknown time  . rosuvastatin (CRESTOR) 20 MG tablet Take 1 tablet by mouth once daily (Patient taking differently: Take 20 mg by mouth daily. ) 90 tablet 1 12/14/2018 at Unknown time  . sevelamer carbonate (RENVELA) 800 MG tablet Take 800 mg by mouth See admin instructions. 4 tablets with meals(3 times daily) and 2 tablets with snacks(twice daily). Total of 16 tablets daily.   12/14/2018 at Unknown time  . triamcinolone ointment (KENALOG) 0.5 % Apply 1 application topically 2 (two) times daily. 60 g 0   . TRULICITY 1.5 0000000 SOPN INJECT 1 DOSE (1.5 MG) SUBCUTANEOUSLY ONCE A WEEK (Patient taking differently: Inject 1.5 mg into the skin once a week. )  12 mL 1 Past Week at Unknown time  . albuterol (PROVENTIL HFA;VENTOLIN HFA) 108 (90 Base) MCG/ACT inhaler Inhale 1-2 puffs into the lungs every 6 (six) hours as needed for wheezing or shortness of breath. (Patient not taking: Reported on 12/03/2018) 1 Inhaler 0 Not Taking at Unknown time  . fluticasone (FLONASE) 50 MCG/ACT nasal spray Place 2 sprays into both nostrils daily. (Patient not taking: Reported on 12/03/2018) 16 g 6 Not Taking at Unknown time   Scheduled: . Chlorhexidine Gluconate Cloth  6 each Topical Q0600  . [START ON 12/18/2018] Darbepoetin Alfa  60 mcg Intravenous Q7 days  . guaiFENesin  1,200 mg Oral BID  . insulin aspart  0-20 Units Subcutaneous Q4H  . insulin aspart  3 Units Subcutaneous TID WC  . insulin detemir  10 Units Subcutaneous Daily  . metoprolol tartrate  12.5 mg Oral BID  . pantoprazole (PROTONIX) IV  40 mg Intravenous Q24H  . predniSONE  40 mg Oral Q breakfast  .  sevelamer carbonate  3,200 mg Oral TID WC  . tacrolimus  0.5 mg Oral q morning - 10a   Continuous: . sodium chloride    . sodium chloride    . piperacillin-tazobactam (ZOSYN)  IV Stopped (12/17/18 0130)   [id number] chloride, sodium chloride, acetaminophen **OR** acetaminophen, ipratropium-albuterol, lidocaine (PF), lidocaine-prilocaine, meclizine, ondansetron **OR** ondansetron (ZOFRAN) IV, pentafluoroprop-tetrafluoroeth, polyethylene glycol, sevelamer carbonate  Assesment: She was admitted with acute hypoxic respiratory failure multifactorial.  She had some volume overload and also had what appeared to be some aspiration with mucous/food plugging with lobar collapse.  She briefly required nonrebreather mask but she is now on nasal cannula.  She looks much improved. Active Problems:   Dyspnea   Lung collapse   Acute hypoxemic respiratory failure (HCC)    Plan: Discussed with Dr. Manuella Ghazi.  Plan is for her to be transferred out of stepdown which I think is appropriate.    LOS: 2 days   Alonza Bogus 12/17/2018, 8:41 AM

## 2018-12-18 LAB — CBC
HCT: 27.3 % — ABNORMAL LOW (ref 36.0–46.0)
Hemoglobin: 8.5 g/dL — ABNORMAL LOW (ref 12.0–15.0)
MCH: 31.6 pg (ref 26.0–34.0)
MCHC: 31.1 g/dL (ref 30.0–36.0)
MCV: 101.5 fL — ABNORMAL HIGH (ref 80.0–100.0)
Platelets: 157 10*3/uL (ref 150–400)
RBC: 2.69 MIL/uL — ABNORMAL LOW (ref 3.87–5.11)
RDW: 13.7 % (ref 11.5–15.5)
WBC: 26.4 10*3/uL — ABNORMAL HIGH (ref 4.0–10.5)
nRBC: 0 % (ref 0.0–0.2)

## 2018-12-18 LAB — COMPREHENSIVE METABOLIC PANEL
ALT: 31 U/L (ref 0–44)
AST: 26 U/L (ref 15–41)
Albumin: 3.1 g/dL — ABNORMAL LOW (ref 3.5–5.0)
Alkaline Phosphatase: 78 U/L (ref 38–126)
Anion gap: 17 — ABNORMAL HIGH (ref 5–15)
BUN: 56 mg/dL — ABNORMAL HIGH (ref 8–23)
CO2: 24 mmol/L (ref 22–32)
Calcium: 8.7 mg/dL — ABNORMAL LOW (ref 8.9–10.3)
Chloride: 92 mmol/L — ABNORMAL LOW (ref 98–111)
Creatinine, Ser: 5.96 mg/dL — ABNORMAL HIGH (ref 0.44–1.00)
GFR calc Af Amer: 8 mL/min — ABNORMAL LOW (ref >60)
GFR calc non Af Amer: 7 mL/min — ABNORMAL LOW (ref >60)
Glucose, Bld: 127 mg/dL — ABNORMAL HIGH (ref 70–99)
Potassium: 4.8 mmol/L (ref 3.5–5.1)
Sodium: 133 mmol/L — ABNORMAL LOW (ref 135–145)
Total Bilirubin: 1.2 mg/dL (ref 0.3–1.2)
Total Protein: 6.3 g/dL — ABNORMAL LOW (ref 6.5–8.1)

## 2018-12-18 LAB — GLUCOSE, CAPILLARY
Glucose-Capillary: 161 mg/dL — ABNORMAL HIGH (ref 70–99)
Glucose-Capillary: 159 mg/dL — ABNORMAL HIGH (ref 70–99)
Glucose-Capillary: 153 mg/dL — ABNORMAL HIGH (ref 70–99)
Glucose-Capillary: 204 mg/dL — ABNORMAL HIGH (ref 70–99)

## 2018-12-18 LAB — MAGNESIUM: Magnesium: 2.4 mg/dL (ref 1.7–2.4)

## 2018-12-18 LAB — OCCULT BLOOD X 1 CARD TO LAB, STOOL: Fecal Occult Bld: NEGATIVE

## 2018-12-18 MED ORDER — HEPARIN SODIUM (PORCINE) 1000 UNIT/ML DIALYSIS: 40.0000 [IU]/kg | INTRAMUSCULAR | Status: DC | PRN

## 2018-12-18 MED ORDER — INSULIN ASPART 100 UNIT/ML ~~LOC~~ SOLN
0.0000 [IU] | Freq: Three times a day (TID) | SUBCUTANEOUS | Status: DC
  Administered 2018-12-19: 7 [IU] via SUBCUTANEOUS
  Administered 2018-12-19: 11 [IU] via SUBCUTANEOUS

## 2018-12-18 NOTE — Plan of Care (Signed)
  Problem: Acute Rehab PT Goals(only PT should resolve) Goal: Pt Will Go Supine/Side To Sit Outcome: Progressing Flowsheets (Taken 12/18/2018 1530) Pt will go Supine/Side to Sit: Independently Goal: Patient Will Transfer Sit To/From Stand Outcome: Progressing Flowsheets (Taken 12/18/2018 1530) Patient will transfer sit to/from stand: with modified independence Note: Using [facility] Goal: Pt Will Transfer Bed To Chair/Chair To Bed Outcome: Progressing Flowsheets (Taken 12/18/2018 1530) Pt will Transfer Bed to Chair/Chair to Bed: with modified independence Note: Using [facility] Goal: Pt Will Ambulate Outcome: Progressing Flowsheets (Taken 12/18/2018 1530) Pt will Ambulate:  with moderate assist  with cane  > 125 feet   3:31 PM, 12/18/18 Lonell Grandchild, MPT Physical Therapist with [facility] [phone] office [phone] mobile phone

## 2018-12-18 NOTE — Progress Notes (Signed)
Subjective: She says she had an episode this morning of increased shortness of breath.  She is on 3 L oxygen.  She is not coughing much.  She does not have any other complaints.  Objective: Vital signs in last 24 hours: Temp:  [97.9 F (36.6 C)-98.3 F (36.8 C)] 97.9 F (36.6 C) (10/14 0717) Pulse Rate:  [63-74] 67 (10/14 0717) Resp:  [15-21] 17 (10/14 0717) BP: (115-142)/(49-92) 142/63 (10/14 0500) SpO2:  [93 %-100 %] 97 % (10/14 0717) Weight:  [72 kg] 72 kg (10/14 0500) Weight change: 0.8 kg    Intake/Output from previous day: 10/13 0701 - 10/14 0700 In: 254.6 [P.O.:120; IV Piggyback:134.6] Out: 0   PHYSICAL EXAM General appearance: alert, cooperative and no distress Resp: She has some scattered rhonchi but her chest is really pretty clear Cardio: regular rate and rhythm, S1, S2 normal, no murmur, click, rub or gallop GI: soft, non-tender; bowel sounds normal; no masses,  no organomegaly Extremities: extremities normal, atraumatic, no cyanosis or edema  Lab Results:  Results for orders placed or performed during the hospital encounter of 12/15/18 (from the past 48 hour(s))  CBG monitoring, ED     Status: Abnormal   Collection Time: 12/16/18  7:55 AM  Result Value Ref Range   Glucose-Capillary 116 (H) 70 - 99 mg/dL  Glucose, capillary     Status: Abnormal   Collection Time: 12/16/18 12:23 PM  Result Value Ref Range   Glucose-Capillary 213 (H) 70 - 99 mg/dL  Glucose, capillary     Status: Abnormal   Collection Time: 12/16/18  3:45 PM  Result Value Ref Range   Glucose-Capillary 170 (H) 70 - 99 mg/dL  Glucose, capillary     Status: Abnormal   Collection Time: 12/16/18  9:04 PM  Result Value Ref Range   Glucose-Capillary 310 (H) 70 - 99 mg/dL  Glucose, capillary     Status: Abnormal   Collection Time: 12/16/18 11:38 PM  Result Value Ref Range   Glucose-Capillary 289 (H) 70 - 99 mg/dL  CBC     Status: Abnormal   Collection Time: 12/17/18  4:59 AM  Result Value Ref  Range   WBC 16.6 (H) 4.0 - 10.5 K/uL   RBC 2.44 (L) 3.87 - 5.11 MIL/uL   Hemoglobin 7.8 (L) 12.0 - 15.0 g/dL   HCT 24.9 (L) 36.0 - 46.0 %   MCV 102.0 (H) 80.0 - 100.0 fL   MCH 32.0 26.0 - 34.0 pg   MCHC 31.3 30.0 - 36.0 g/dL   RDW 13.4 11.5 - 15.5 %   Platelets 137 (L) 150 - 400 K/uL   nRBC 0.0 0.0 - 0.2 %    Comment: Performed at [facility], [address], [place], [place] [address]  Comprehensive metabolic panel     Status: Abnormal   Collection Time: 12/17/18  4:59 AM  Result Value Ref Range   Sodium 132 (L) 135 - 145 mmol/L   Potassium 4.5 3.5 - 5.1 mmol/L   Chloride 91 (L) 98 - 111 mmol/L   CO2 22 22 - 32 mmol/L   Glucose, Bld 414 (H) 70 - 99 mg/dL   BUN 32 (H) 8 - 23 mg/dL   Creatinine, Ser 4.39 (H) 0.44 - 1.00 mg/dL    Comment: DELTA CHECK NOTED   Calcium 8.3 (L) 8.9 - 10.3 mg/dL   Total Protein 6.3 (L) 6.5 - 8.1 g/dL   Albumin 3.2 (L) 3.5 - 5.0 g/dL   AST 33 15 - 41 U/L  ALT 35 0 - 44 U/L   Alkaline Phosphatase 84 38 - 126 U/L   Total Bilirubin 1.5 (H) 0.3 - 1.2 mg/dL   GFR calc non Af Amer 10 (L) >60 mL/min   GFR calc Af Amer 12 (L) >60 mL/min   Anion gap 19 (H) 5 - 15    Comment: Performed at [facility], [address], [place], [place] [address]  Glucose, capillary     Status: Abnormal   Collection Time: 12/17/18  7:32 AM  Result Value Ref Range   Glucose-Capillary 467 (H) 70 - 99 mg/dL  Vitamin B12     Status: None   Collection Time: 12/17/18  8:04 AM  Result Value Ref Range   Vitamin B-12 683 180 - 914 pg/mL    Comment: (NOTE) This assay is not validated for testing neonatal or myeloproliferative syndrome specimens for Vitamin B12 levels. Performed at [facility], [address], [place], [place] [address]   Folate     Status: None   Collection Time: 12/17/18  8:04 AM  Result Value Ref Range   Folate 32.1 >5.9 ng/mL    Comment: RESULTS CONFIRMED BY MANUAL DILUTION Performed at [facility], [address], [place], [place] [address]   Iron and  TIBC     Status: Abnormal   Collection Time: 12/17/18  8:04 AM  Result Value Ref Range   Iron 39 28 - 170 ug/dL   TIBC 183 (L) 250 - 450 ug/dL   Saturation Ratios 21 10.4 - 31.8 %   UIBC 144 ug/dL    Comment: Performed at [facility], [address] [place], [place] [address]  Ferritin     Status: Abnormal   Collection Time: 12/17/18  8:04 AM  Result Value Ref Range   Ferritin 989 (H) 11 - 307 ng/mL    Comment: Performed at [facility], [address], [place], [place] [address]  Reticulocytes     Status: Abnormal   Collection Time: 12/17/18  8:04 AM  Result Value Ref Range   Retic Ct Pct 2.5 0.4 - 3.1 %   RBC. 2.32 (L) 3.87 - 5.11 MIL/uL   Retic Count, Absolute 58.9 19.0 - 186.0 K/uL   Immature Retic Fract 12.8 2.3 - 15.9 %    Comment: Performed at [facility], [address], [place], [place] [address]  Glucose, capillary     Status: Abnormal   Collection Time: 12/17/18 11:45 AM  Result Value Ref Range   Glucose-Capillary 300 (H) 70 - 99 mg/dL  Glucose, capillary     Status: None   Collection Time: 12/17/18  5:13 PM  Result Value Ref Range   Glucose-Capillary 91 70 - 99 mg/dL  Glucose, capillary     Status: Abnormal   Collection Time: 12/17/18  7:54 PM  Result Value Ref Range   Glucose-Capillary 117 (H) 70 - 99 mg/dL  Glucose, capillary     Status: Abnormal   Collection Time: 12/17/18 11:42 PM  Result Value Ref Range   Glucose-Capillary 132 (H) 70 - 99 mg/dL  CBC     Status: Abnormal   Collection Time: 12/18/18  4:05 AM  Result Value Ref Range   WBC 26.4 (H) 4.0 - 10.5 K/uL   RBC 2.69 (L) 3.87 - 5.11 MIL/uL   Hemoglobin 8.5 (L) 12.0 - 15.0 g/dL   HCT 27.3 (L) 36.0 - 46.0 %   MCV 101.5 (H) 80.0 - 100.0 fL   MCH 31.6 26.0 - 34.0 pg  MCHC 31.1 30.0 - 36.0 g/dL   RDW 13.7 11.5 - 15.5 %   Platelets 157 150 - 400 K/uL   nRBC 0.0 0.0 - 0.2 %    Comment: Performed at [facility], [address] [place], [place] [address]  Comprehensive metabolic panel     Status:  Abnormal   Collection Time: 12/18/18  4:05 AM  Result Value Ref Range   Sodium 133 (L) 135 - 145 mmol/L   Potassium 4.8 3.5 - 5.1 mmol/L   Chloride 92 (L) 98 - 111 mmol/L   CO2 24 22 - 32 mmol/L   Glucose, Bld 127 (H) 70 - 99 mg/dL   BUN 56 (H) 8 - 23 mg/dL   Creatinine, Ser 5.96 (H) 0.44 - 1.00 mg/dL   Calcium 8.7 (L) 8.9 - 10.3 mg/dL   Total Protein 6.3 (L) 6.5 - 8.1 g/dL   Albumin 3.1 (L) 3.5 - 5.0 g/dL   AST 26 15 - 41 U/L   ALT 31 0 - 44 U/L   Alkaline Phosphatase 78 38 - 126 U/L   Total Bilirubin 1.2 0.3 - 1.2 mg/dL   GFR calc non Af Amer 7 (L) >60 mL/min   GFR calc Af Amer 8 (L) >60 mL/min   Anion gap 17 (H) 5 - 15    Comment: Performed at [facility], [address] [place],  [address]  Magnesium     Status: None   Collection Time: 12/18/18  4:05 AM  Result Value Ref Range   Magnesium 2.4 1.7 - 2.4 mg/dL    Comment: Performed at [facility], [address], [place], [place] [address]  Glucose, capillary     Status: Abnormal   Collection Time: 12/18/18  7:16 AM  Result Value Ref Range   Glucose-Capillary 161 (H) 70 - 99 mg/dL    ABGS No results for input(s): PHART, PO2ART, TCO2, HCO3 in the last 72 hours.  Invalid input(s): PCO2 CULTURES Recent Results (from the past 240 hour(s))  SARS Coronavirus 2 by RT PCR (hospital order, performed in [facility] hospital lab) Nasopharyngeal Nasopharyngeal Swab     Status: None   Collection Time: 12/15/18  1:00 PM   Specimen: Nasopharyngeal Swab  Result Value Ref Range Status   SARS Coronavirus 2 NEGATIVE NEGATIVE Final    Comment: (NOTE) If result is NEGATIVE SARS-CoV-2 target nucleic acids are NOT DETECTED. The SARS-CoV-2 RNA is generally detectable in upper and lower  respiratory specimens during the acute phase of infection. The lowest  concentration of SARS-CoV-2 viral copies this assay can detect is 250  copies / mL. A negative result does not preclude SARS-CoV-2 infection  and should not be used as the sole  basis for treatment or other  patient management decisions.  A negative result may occur with  improper specimen collection / handling, submission of specimen other  than nasopharyngeal swab, presence of viral mutation(s) within the  areas targeted by this assay, and inadequate number of viral copies  (<250 copies / mL). A negative result must be combined with clinical  observations, patient history, and epidemiological information. If result is POSITIVE SARS-CoV-2 target nucleic acids are DETECTED. The SARS-CoV-2 RNA is generally detectable in upper and lower  respiratory specimens dur ing the acute phase of infection.  Positive  results are indicative of active infection with SARS-CoV-2.  Clinical  correlation with patient history and other diagnostic information is  necessary to determine patient infection status.  Positive results do  not rule  out bacterial infection or co-infection with other viruses. If result is PRESUMPTIVE POSTIVE SARS-CoV-2 nucleic acids MAY BE PRESENT.   A presumptive positive result was obtained on the submitted specimen  and confirmed on repeat testing.  While 2019 novel coronavirus  (SARS-CoV-2) nucleic acids may be present in the submitted sample  additional confirmatory testing may be necessary for epidemiological  and / or clinical management purposes  to differentiate between  SARS-CoV-2 and other Sarbecovirus currently known to infect humans.  If clinically indicated additional testing with an alternate test  methodology [phone]) is advised. The SARS-CoV-2 RNA is generally  detectable in upper and lower respiratory sp ecimens during the acute  phase of infection. The expected result is Negative. Fact Sheet for Patients:  StrictlyIdeas.no Fact Sheet for Healthcare Providers: BankingDealers.co.za This test is not yet approved or cleared by the [place] FDA and has been authorized for detection  and/or diagnosis of SARS-CoV-2 by FDA under an Emergency Use Authorization (EUA).  This EUA will remain in effect (meaning this test can be used) for the duration of the COVID-19 declaration under Section 564(b)(1) of the Act, 21 U.S.C. section 360bbb-3(b)(1), unless the authorization is terminated or revoked sooner. Performed at [facility], [address], [place], [place] [address]   Blood Culture (routine x 2)     Status: None (Preliminary result)   Collection Time: 12/15/18  2:11 PM   Specimen: BLOOD RIGHT FOREARM  Result Value Ref Range Status   Specimen Description   Final    BLOOD RIGHT FOREARM BOTTLES DRAWN AEROBIC AND ANAEROBIC   Special Requests Blood Culture adequate volume  Final   Culture   Final    NO GROWTH 2 DAYS Performed at [facility], [address] [place], [place] [address]    Report Status PENDING  Incomplete  Blood Culture (routine x 2)     Status: None (Preliminary result)   Collection Time: 12/15/18  3:26 PM   Specimen: Right Antecubital; Blood  Result Value Ref Range Status   Specimen Description   Final    RIGHT ANTECUBITAL BOTTLES DRAWN AEROBIC AND ANAEROBIC   Special Requests Blood Culture adequate volume  Final   Culture   Final    NO GROWTH 2 DAYS Performed at [facility], [address] [place], [place] [address]    Report Status PENDING  Incomplete   Studies/Results: No results found.  Medications:  Prior to Admission:  Medications Prior to Admission  Medication Sig Dispense Refill Last Dose  . acetaminophen (TYLENOL) 500 MG tablet Take 500 mg by mouth every 6 (six) hours as needed for moderate pain or headache.      Marland Kitchen amLODipine (NORVASC) 5 MG tablet Take 1 tablet by mouth nightly (Patient taking differently: Take 5 mg by mouth at bedtime. ) 90 tablet 0 12/14/2018 at Unknown time  . aspirin (ASPIR-LOW) 81 MG EC tablet Take 81 mg by mouth daily.    12/14/2018 at 930  . B Complex-C-Folic Acid (RENA-VITE RX) 1 MG TABS Take 1 tablet by  mouth daily.   12/14/2018 at Unknown time  . insulin degludec (TRESIBA FLEXTOUCH) 100 UNIT/ML SOPN FlexTouch Pen Inject 0.1 mLs (10 Units total) into the skin daily at 10 pm. 12 pen 2 12/14/2018 at Unknown time  . isosorbide mononitrate (IMDUR) 60 MG 24 hr tablet Take 1 tablet by mouth once daily (Patient taking differently: Take 60 mg by mouth daily. ) 90 tablet 0 12/14/2018 at Unknown time  . lidocaine-prilocaine (EMLA) cream  Apply 1 application topically as needed (for dialysis).   0   . meclizine (ANTIVERT) 25 MG tablet Take 1 tablet (25 mg total) by mouth 2 (two) times daily as needed for dizziness. 30 tablet 0 12/14/2018 at Unknown time  . metoprolol tartrate (LOPRESSOR) 25 MG tablet Take 1/2 (one-half) tablet by mouth twice daily (Patient taking differently: Take 12.5 mg by mouth 2 (two) times daily. ) 90 tablet 1 12/14/2018 at 2100  . ondansetron (ZOFRAN ODT) 4 MG disintegrating tablet 4mg  ODT q4 hours prn nausea/vomit (Patient taking differently: Take 4 mg by mouth 2 (two) times daily. 4mg  ODT q4 hours prn nausea/vomit) 12 tablet 0   . predniSONE (DELTASONE) 5 MG tablet TAKE 1 TABLET BY MOUTH ONCE DAILY WITH BREAKFAST 90 tablet 0 12/14/2018 at Unknown time  . PROGRAF 0.5 MG capsule Take 0.5 mg by mouth daily. Take 0.5mg  by mouth each morning.  0 12/14/2018 at Unknown time  . rosuvastatin (CRESTOR) 20 MG tablet Take 1 tablet by mouth once daily (Patient taking differently: Take 20 mg by mouth daily. ) 90 tablet 1 12/14/2018 at Unknown time  . sevelamer carbonate (RENVELA) 800 MG tablet Take 800 mg by mouth See admin instructions. 4 tablets with meals(3 times daily) and 2 tablets with snacks(twice daily). Total of 16 tablets daily.   12/14/2018 at Unknown time  . triamcinolone ointment (KENALOG) 0.5 % Apply 1 application topically 2 (two) times daily. 60 g 0   . TRULICITY 1.5 0000000 SOPN INJECT 1 DOSE (1.5 MG) SUBCUTANEOUSLY ONCE A WEEK (Patient taking differently: Inject 1.5 mg into the skin  once a week. ) 12 mL 1 Past Week at Unknown time  . albuterol (PROVENTIL HFA;VENTOLIN HFA) 108 (90 Base) MCG/ACT inhaler Inhale 1-2 puffs into the lungs every 6 (six) hours as needed for wheezing or shortness of breath. (Patient not taking: Reported on 12/03/2018) 1 Inhaler 0 Not Taking at Unknown time  . fluticasone (FLONASE) 50 MCG/ACT nasal spray Place 2 sprays into both nostrils daily. (Patient not taking: Reported on 12/03/2018) 16 g 6 Not Taking at Unknown time   Scheduled: . Chlorhexidine Gluconate Cloth  6 each Topical Q0600  . Darbepoetin Alfa  60 mcg Intravenous Q7 days  . guaiFENesin  1,200 mg Oral BID  . insulin aspart  0-20 Units Subcutaneous Q4H  . insulin aspart  3 Units Subcutaneous TID WC  . insulin detemir  10 Units Subcutaneous Daily  . metoprolol tartrate  12.5 mg Oral BID  . pantoprazole (PROTONIX) IV  40 mg Intravenous Q24H  . predniSONE  40 mg Oral Q breakfast  . sevelamer carbonate  3,200 mg Oral TID WC  . tacrolimus  0.5 mg Oral q morning - 10a   Continuous: . sodium chloride    . sodium chloride    . piperacillin-tazobactam (ZOSYN)  IV Stopped (12/18/18 0132)   [id number] chloride, sodium chloride, acetaminophen **OR** acetaminophen, ipratropium-albuterol, lidocaine (PF), lidocaine-prilocaine, meclizine, ondansetron **OR** ondansetron (ZOFRAN) IV, pentafluoroprop-tetrafluoroeth, polyethylene glycol, sevelamer carbonate  Assesment: She has acute hypoxic respiratory failure related to likely aspiration pneumonia and volume overload.  She is on dialysis with end-stage renal disease and she is due to dialyze today.  She is having a little more trouble today so it may be helpful for her to have the dialysis.  She is being treated for pneumonia as well.  She is not coughing very much.  She does not have any respiratory disease at baseline  She has diabetes on sliding scale  Active Problems:   Dyspnea   Lung collapse   Acute hypoxemic respiratory failure  (HCC)    Plan: Continue current treatments.  Agree she probably needs to stay today.    LOS: 3 days   Alonza Bogus 12/18/2018, 7:47 AM

## 2018-12-18 NOTE — Evaluation (Signed)
Physical Therapy Evaluation Patient Details Name: [name] MRN: [id number] DOB: 03/07/1955 Today's Date: 12/18/2018   History of Present Illness  [name] is a 63 y.o. female with medical history significant for  ESRD, diastolic congestive heart failure and pulmonary hypertension, coronary artery disease, hypertension,, diabetes mellitus.  Patient presented to the ED with complaints of difficulty breathing that started at about 2 AM today.  Patient recorded her O2 sats with a home O2 device and it was 74% on room air.  She denies any other respiratory symptoms, no cough, no chest pain.  She has chronic stable mild lower extremity swelling that is unchanged.  She reports compliance with fluid and low-salt intake.  She is on dialysis Monday Wednesday Friday, her last HD session was Friday.    Clinical Impression  Patient functioning near baseline for functional mobility and gait, had to use RW due to unsteadiness with tendency to lean on nearby objects for support without AD, ambulated in hallway without loss of balance using RW and tolerated sitting up in chair after therapy.  Patient will benefit from continued physical therapy in hospital and recommended venue below to increase strength, balance, endurance for safe ADLs and gait.     Follow Up Recommendations Home health PT;Supervision - Intermittent    Equipment Recommendations  None recommended by PT    Recommendations for Other Services       Precautions / Restrictions Precautions Precautions: Fall Restrictions Weight Bearing Restrictions: No      Mobility  Bed Mobility Overal bed mobility: Modified Independent             General bed mobility comments: increased time  Transfers Overall transfer level: Modified independent               General transfer comment: increased time  Ambulation/Gait Ambulation/Gait assistance: Supervision;Min guard Gait Distance (Feet): 80 Feet Assistive device: Rolling  walker (2 wheeled) Gait Pattern/deviations: Decreased step length - right;Decreased step length - left;Decreased stride length Gait velocity: decreased   General Gait Details: slow slightly labored cadence having to lean on nearby objects for support, increased stability/safety using RW, ambulated in hallway without loss of balance  Stairs            Wheelchair Mobility    Modified Rankin (Stroke Patients Only)       Balance Overall balance assessment: Needs assistance Sitting-balance support: Feet supported;No upper extremity supported Sitting balance-Leahy Scale: Good Sitting balance - Comments: seated at bedside   Standing balance support: During functional activity;No upper extremity supported Standing balance-Leahy Scale: Poor Standing balance comment: fair/poor without AD, fair/good using RW                             Pertinent Vitals/Pain Pain Assessment: No/denies pain    Home Living Family/patient expects to be discharged to:: Private residence Living Arrangements: Spouse/significant other Available Help at Discharge: Family;Available 24 hours/day Type of Home: House Home Access: Stairs to enter Entrance Stairs-Rails: None Entrance Stairs-Number of Steps: 2 Home Layout: Two level Home Equipment: Walker - 2 wheels;Cane - single point;Wheelchair - manual;Shower seat;Bedside commode      Prior Function Level of Independence: Independent with assistive device(s)         Comments: Household and short distanced community ambulator using [facility] Fall     Hand Dominance        Extremity/Trunk Assessment   Upper Extremity Assessment Upper Extremity Assessment: Generalized weakness  Lower Extremity Assessment Lower Extremity Assessment: Generalized weakness    Cervical / Trunk Assessment Cervical / Trunk Assessment: Normal  Communication   Communication: No difficulties  Cognition Arousal/Alertness: Awake/alert Behavior During Therapy:  WFL for tasks assessed/performed Overall Cognitive Status: Within Functional Limits for tasks assessed                                        General Comments      Exercises     Assessment/Plan    PT Assessment Patient needs continued PT services  PT Problem List Decreased strength;Decreased activity tolerance;Decreased balance;Decreased mobility       PT Treatment Interventions Balance training;Gait training;Stair training;Functional mobility training;Therapeutic activities;Therapeutic exercise;Patient/family education    PT Goals (Current goals can be found in the Care Plan section)  Acute Rehab PT Goals Patient Stated Goal: return home with family to assist PT Goal Formulation: With patient Time For Goal Achievement: 12/23/18 Potential to Achieve Goals: Good    Frequency Min 3X/week   Barriers to discharge        Co-evaluation               AM-PAC PT "6 Clicks" Mobility  Outcome Measure Help needed turning from your back to your side while in a flat bed without using bedrails?: None Help needed moving from lying on your back to sitting on the side of a flat bed without using bedrails?: None Help needed moving to and from a bed to a chair (including a wheelchair)?: None Help needed standing up from a chair using your arms (e.g., wheelchair or bedside chair)?: None Help needed to walk in hospital room?: A Little Help needed climbing 3-5 steps with a railing? : A Little 6 Click Score: 22    End of Session   Activity Tolerance: Patient tolerated treatment well;Patient limited by fatigue Patient left: in chair;with call bell/phone within reach Nurse Communication: Mobility status PT Visit Diagnosis: Other abnormalities of gait and mobility (R26.89);Unsteadiness on feet (R26.81);Muscle weakness (generalized) (M62.81)    Time: 1410-1436 PT Time Calculation (min) (ACUTE ONLY): 26 min   Charges:   PT Evaluation $PT Eval Moderate Complexity: 1  Mod PT Treatments $Therapeutic Activity: 23-37 mins        3:29 PM, 12/18/18 Lonell Grandchild, MPT Physical Therapist with [facility] 336 [phone] office [phone] mobile phone

## 2018-12-18 NOTE — Progress Notes (Signed)
PROGRESS NOTE    [name]  [id number] DOB: Jun 06, 1955 DOA: 12/15/2018 PCP: Sharion Balloon, FNP   Brief Narrative:  Per HPI: Sue Shave Goinsis a 63 y.o.femalewith medical history significant forESRD,diastolic congestive heart failure and pulmonary hypertension, coronary artery disease, hypertension,, diabetes mellitus.Patient presented to the ED with complaints of difficulty breathing that started at about 2 AM today. Patient recorded her O2 sats with a home O2 device and it was 74% on room air. She denies any other respiratory symptoms, no cough, no chest pain. She has chronic stable mild lower extremity swelling that is unchanged. She reports compliance with fluid and low-salt intake. She is on dialysis Monday Wednesday Friday, her last HD session was Friday.  Recent hospitalization9/28-9/30-sepsis secondary to pneumoniawith acute respiratory failure, treated with broad-spectrum antibiotics.  10/12:Patient is noted to have some findings of lung collapse with suspected aspiration for which we will continue patient on Zosyn for treatment. Appreciate pulmonology evaluation and will start on Mucomyst due to some mucus plugging associated with aspiration event that may have led to lung collapse. She also appears to be volume overloaded and will receive hemodialysis today with nephrology.  10/13: Patient appears to be doing well this morning with no nausea or vomiting, shortness of breath or chest pain.  She is currently on 2 L nasal cannula with no acute events noted overnight.  Her blood glucose is elevated and her hemoglobin is downtrending.  She is noted to have iron deficiency anemia.  10/14: Patient improving, but still has some shortness of breath.  She is due for hemodialysis today.  Blood glucose levels have improved and anemia levels have remained stable.  Plan to wean oxygen today and have PT evaluation for ambulation as she does not wear oxygen at  home.  Assessment & Plan:   Active Problems:   Dyspnea   Lung collapse   Acute hypoxemic respiratory failure (HCC)   Acute hypoxemic respiratory failure related to aspiration pneumonia with likely mucous plugging and lung collapse -Appreciate pulmonology evaluation with mucolytic's added -Maintain on IV Zosyn for aspiration coverage with plans for Augmentin on discharge -May be able to transfer to telemetry if okay with pulmonology -Continue oral prednisone with breathing treatments as needed  ESRD on HD MWF with volume overload -Appreciate nephrology evaluation and underwent hemodialysis with 1.5 L ultrafiltration on 10/12 -Plan for hemodialysis today  Anemia of chronic disease-downtrending -Noted history of iron deficiency as well -We will check anemia panel and stool occult with no overt bleeding identified -Continue monitor repeat CBC   Diabetes mellitus- with steroid-induced hyperglycemia, improved -Holding home Tresiba -Maintain on SSI every 4 hours with aggressive scale -Levemir 10 units daily  -We will also plan to add mealtime insulin -Diabetes coordinator recommendations appreciated  History of CAD -Hold statin with elevated liver enzymes for now -Resume home aspirin  Hypertension-soft blood pressures -Monitor closely with hemodialysis -Blood pressures are currently soft and will hold antihypertensives with the exception of metoprolol  Elevated liver enzymes-downtrending -Could be related to congestive hepatopathy -Acute hepatitis panel with no significant findings -No need for further follow-up and may be evaluated outpatient  Prolonged QTC -Magnesium 2.4 -Monitor on telemetry  DVT prophylaxis:SCDs Code Status:Full code Family Communication:Patient will update spouse Disposition Plan: Plan to continue current antibiotics and steroids with breathing treatments as needed.  Plan to wean oxygen with PT evaluation today.  Hemodialysis  today.   Consultants:  Pulmonology  Nephrology  Procedures:  None  Antimicrobials:  Anti-infectives (From admission,  onward)   Start     Dose/Rate Route Frequency Ordered Stop   12/16/18 2300  piperacillin-tazobactam (ZOSYN) IVPB 3.375 g     3.375 g 12.5 mL/hr over 240 Minutes Intravenous Every 12 hours 12/16/18 1009     12/16/18 1630  ceFEPIme (MAXIPIME) 1 g in sodium chloride 0.9 % 100 mL IVPB  Status:  Discontinued     1 g 200 mL/hr over 30 Minutes Intravenous Every 24 hours 12/15/18 1623 12/15/18 2158   12/16/18 1200  vancomycin (VANCOCIN) IVPB 750 mg/150 ml premix  Status:  Discontinued     750 mg 150 mL/hr over 60 Minutes Intravenous Every M-W-F (Hemodialysis) 12/15/18 1621 12/15/18 2158   12/16/18 1030  piperacillin-tazobactam (ZOSYN) IVPB 3.375 g     3.375 g 100 mL/hr over 30 Minutes Intravenous  Once 12/16/18 1002 12/16/18 1138   12/15/18 1630  Vancomycin (VANCOCIN) 1,250 mg in sodium chloride 0.9 % 250 mL IVPB     1,250 mg 166.7 mL/hr over 90 Minutes Intravenous  Once 12/15/18 1620 12/15/18 1829   12/15/18 1615  ceFEPIme (MAXIPIME) 1 g in sodium chloride 0.9 % 100 mL IVPB     1 g 200 mL/hr over 30 Minutes Intravenous  Once 12/15/18 1603 12/15/18 1732       Subjective: Patient seen and evaluated today with no new acute complaints or concerns. No acute concerns or events noted overnight.  She remains on nasal cannula oxygen.  Objective: Vitals:   12/18/18 0950 12/18/18 0955 12/18/18 1015 12/18/18 1030  BP: 120/79  (!) 131/56 132/68  Pulse: 70 71 69 74  Resp: 17 14 13 12   Temp: 98 F (36.7 C)     TempSrc: Oral     SpO2: 94%     Weight: 72 kg     Height:        Intake/Output Summary (Last 24 hours) at 12/18/2018 1042 Last data filed at 12/18/2018 0602 Gross per 24 hour  Intake 134.58 ml  Output 0 ml  Net 134.58 ml   Filed Weights   12/17/18 0500 12/18/18 0500 12/18/18 0950  Weight: 71.3 kg 72 kg 72 kg    Examination:  General exam:  Appears calm and comfortable  Respiratory system: Clear to auscultation. Respiratory effort normal.  3 L nasal cannula oxygen. Cardiovascular system: S1 & S2 heard, RRR. No JVD, murmurs, rubs, gallops or clicks. No pedal edema. Gastrointestinal system: Abdomen is nondistended, soft and nontender. No organomegaly or masses felt. Normal bowel sounds heard. Central nervous system: Alert and oriented. No focal neurological deficits. Extremities: Symmetric 5 x 5 power. Skin: No rashes, lesions or ulcers Psychiatry: Judgement and insight appear normal. Mood & affect appropriate.     Data Reviewed: I have personally reviewed following labs and imaging studies  CBC: Recent Labs  Lab 12/15/18 1229 12/16/18 0157 12/17/18 0459 12/18/18 0405  WBC 12.2* 10.7* 16.6* 26.4*  NEUTROABS 10.2*  --   --   --   HGB 8.8* 9.1* 7.8* 8.5*  HCT 28.6* 30.0* 24.9* 27.3*  MCV 101.8* 104.5* 102.0* 101.5*  PLT 221 160 137* [id number]   Basic Metabolic Panel: Recent Labs  Lab 12/15/18 1229 12/15/18 2307 12/16/18 0157 12/17/18 0459 12/18/18 0405  NA 138  --  137 132* 133*  K 3.9  --  4.9 4.5 4.8  CL 98  --  99 91* 92*  CO2 27  --  28 22 24   GLUCOSE 112*  --  107* 414* 127*  BUN 39*  --  41* 32* 56*  CREATININE 6.72*  --  7.41* 4.39* 5.96*  CALCIUM 8.5*  --  8.1* 8.3* 8.7*  MG  --  2.2  --   --  2.4   GFR: Estimated Creatinine Clearance: 10 mL/min (A) (by C-G formula based on SCr of 5.96 mg/dL (H)). Liver Function Tests: Recent Labs  Lab 12/15/18 1229 12/16/18 0157 12/17/18 0459 12/18/18 0405  AST 47* 37 33 26  ALT 56* 45* 35 31  ALKPHOS 108 96 84 78  BILITOT 0.7 0.8 1.5* 1.2  PROT 7.0 6.5 6.3* 6.3*  ALBUMIN 3.1* 2.8* 3.2* 3.1*   No results for input(s): LIPASE, AMYLASE in the last 168 hours. No results for input(s): AMMONIA in the last 168 hours. Coagulation Profile: No results for input(s): INR, PROTIME in the last 168 hours. Cardiac Enzymes: No results for input(s): CKTOTAL, CKMB,  CKMBINDEX, TROPONINI in the last 168 hours. BNP (last 3 results) No results for input(s): PROBNP in the last 8760 hours. HbA1C: No results for input(s): HGBA1C in the last 72 hours. CBG: Recent Labs  Lab 12/17/18 1145 12/17/18 1713 12/17/18 1954 12/17/18 2342 12/18/18 0716  GLUCAP 300* 91 117* 132* 161*   Lipid Profile: Recent Labs    12/15/18 1229  TRIG 95   Thyroid Function Tests: No results for input(s): TSH, T4TOTAL, FREET4, T3FREE, THYROIDAB in the last 72 hours. Anemia Panel: Recent Labs    12/15/18 1229 12/17/18 0804  VITAMINB12  --  683  FOLATE  --  32.1  FERRITIN 1,064* 989*  TIBC  --  183*  IRON  --  39  RETICCTPCT  --  2.5   Sepsis Labs: Recent Labs  Lab 12/15/18 1229 12/15/18 1526  PROCALCITON 1.15  --   LATICACIDVEN 1.8 1.2    Recent Results (from the past 240 hour(s))  SARS Coronavirus 2 by RT PCR (hospital order, performed in [facility] hospital lab) Nasopharyngeal Nasopharyngeal Swab     Status: None   Collection Time: 12/15/18  1:00 PM   Specimen: Nasopharyngeal Swab  Result Value Ref Range Status   SARS Coronavirus 2 NEGATIVE NEGATIVE Final    Comment: (NOTE) If result is NEGATIVE SARS-CoV-2 target nucleic acids are NOT DETECTED. The SARS-CoV-2 RNA is generally detectable in upper and lower  respiratory specimens during the acute phase of infection. The lowest  concentration of SARS-CoV-2 viral copies this assay can detect is 250  copies / mL. A negative result does not preclude SARS-CoV-2 infection  and should not be used as the sole basis for treatment or other  patient management decisions.  A negative result may occur with  improper specimen collection / handling, submission of specimen other  than nasopharyngeal swab, presence of viral mutation(s) within the  areas targeted by this assay, and inadequate number of viral copies  (<250 copies / mL). A negative result must be combined with clinical  observations, patient history, and  epidemiological information. If result is POSITIVE SARS-CoV-2 target nucleic acids are DETECTED. The SARS-CoV-2 RNA is generally detectable in upper and lower  respiratory specimens dur ing the acute phase of infection.  Positive  results are indicative of active infection with SARS-CoV-2.  Clinical  correlation with patient history and other diagnostic information is  necessary to determine patient infection status.  Positive results do  not rule out bacterial infection or co-infection with other viruses. If result is PRESUMPTIVE POSTIVE SARS-CoV-2 nucleic acids MAY BE PRESENT.   A presumptive positive result was obtained on the submitted  specimen  and confirmed on repeat testing.  While 2019 novel coronavirus  (SARS-CoV-2) nucleic acids may be present in the submitted sample  additional confirmatory testing may be necessary for epidemiological  and / or clinical management purposes  to differentiate between  SARS-CoV-2 and other Sarbecovirus currently known to infect humans.  If clinically indicated additional testing with an alternate test  methodology [phone]) is advised. The SARS-CoV-2 RNA is generally  detectable in upper and lower respiratory sp ecimens during the acute  phase of infection. The expected result is Negative. Fact Sheet for Patients:  StrictlyIdeas.no Fact Sheet for Healthcare Providers: BankingDealers.co.za This test is not yet approved or cleared by the [place] FDA and has been authorized for detection and/or diagnosis of SARS-CoV-2 by FDA under an Emergency Use Authorization (EUA).  This EUA will remain in effect (meaning this test can be used) for the duration of the COVID-19 declaration under Section 564(b)(1) of the Act, 21 U.S.C. section 360bbb-3(b)(1), unless the authorization is terminated or revoked sooner. Performed at [facility], [address], [place], [place] [address]   Blood Culture  (routine x 2)     Status: None (Preliminary result)   Collection Time: 12/15/18  2:11 PM   Specimen: BLOOD RIGHT FOREARM  Result Value Ref Range Status   Specimen Description   Final    BLOOD RIGHT FOREARM BOTTLES DRAWN AEROBIC AND ANAEROBIC   Special Requests Blood Culture adequate volume  Final   Culture   Final    NO GROWTH 3 DAYS Performed at [facility], [place], [address], [place], [place] [address]    Report Status PENDING  Incomplete  Blood Culture (routine x 2)     Status: None (Preliminary result)   Collection Time: 12/15/18  3:26 PM   Specimen: Right Antecubital; Blood  Result Value Ref Range Status   Specimen Description   Final    RIGHT ANTECUBITAL BOTTLES DRAWN AEROBIC AND ANAEROBIC   Special Requests Blood Culture adequate volume  Final   Culture   Final    NO GROWTH 3 DAYS Performed at [facility], [address], [place], [place] [address]    Report Status PENDING  Incomplete         Radiology Studies: No results found.      Scheduled Meds:  Chlorhexidine Gluconate Cloth  6 each Topical Q0600   Darbepoetin Alfa  60 mcg Intravenous Q7 days   guaiFENesin  1,200 mg Oral BID   insulin aspart  0-20 Units Subcutaneous Q4H   insulin aspart  3 Units Subcutaneous TID WC   insulin detemir  10 Units Subcutaneous Daily   metoprolol tartrate  12.5 mg Oral BID   pantoprazole (PROTONIX) IV  40 mg Intravenous Q24H   predniSONE  40 mg Oral Q breakfast   sevelamer carbonate  3,200 mg Oral TID WC   tacrolimus  0.5 mg Oral q morning - 10a   Continuous Infusions:  sodium chloride     sodium chloride     piperacillin-tazobactam (ZOSYN)  IV Stopped (12/18/18 0132)     LOS: 3 days    Time spent: 30 minutes    Heba Ige Darleen Crocker, DO [facility] Pager [phone]  If 7PM-7AM, please contact night-coverage [email or web address] Password [email or web address] 12/18/2018, 10:42 AM

## 2018-12-18 NOTE — Progress Notes (Addendum)
[facility] NEPHROLOGY PROGRESS NOTE  Assessment/ Plan: Pt is a 63 y.o. yo female with hypertension, CHF, CAD, ESRD on HD MWF at [facility], with respiratory failure due to pneumonia and fluid overload.  #Acute respiratory failure with hypoxia: Due to aspiration pneumonia and volume overload.  She is on IV Zosyn, steroids per pulmonary.  Clinically improving.  On room air today.  # ESRD MWF at [facility]: Dialysis today, increase UF goal to challenge dry weight.  Blood pressure is holding good.  May need albumin if patient has intradialytic hypotension.  She has AV fistula for dialysis.  # Anemia due to chronic illness and ESRD: Hemoglobin 8.5, iron saturation 21 with high ferritin level.  Continue ESA.  Holding IV iron while on IV antibiotics.  Monitor CBC.  # Secondary hyperparathyroidism: Check phosphorus level.  Continue Renvela.  # HTN/volume: Blood pressure currently acceptable.  Continue to monitor.  Increasing UF goal to manage volume status.  Subjective: Seen and examined in ICU.  She feels better.  Denied nausea vomiting chest pain.  No current shortness of breath.  Not on oxygen.  Tolerating dialysis well. Objective Vital signs in last 24 hours: Vitals:   12/18/18 0717 12/18/18 0800 12/18/18 0838 12/18/18 0939  BP:  129/68  127/70  Pulse: 67 72 72 70  Resp: 17 (!) 22 18   Temp: 97.9 F (36.6 C)     TempSrc: Oral     SpO2: 97% 96% 91%   Weight:      Height:       Weight change: 0.8 kg  Intake/Output Summary (Last 24 hours) at 12/18/2018 1025 Last data filed at 12/18/2018 0602 Gross per 24 hour  Intake 134.58 ml  Output 0 ml  Net 134.58 ml       Labs: Basic Metabolic Panel: Recent Labs  Lab 12/16/18 0157 12/17/18 0459 12/18/18 0405  NA 137 132* 133*  K 4.9 4.5 4.8  CL 99 91* 92*  CO2 28 22 24   GLUCOSE 107* 414* 127*  BUN 41* 32* 56*  CREATININE 7.41* 4.39* 5.96*  CALCIUM 8.1* 8.3* 8.7*   Liver Function Tests: Recent Labs  Lab  12/16/18 0157 12/17/18 0459 12/18/18 0405  AST 37 33 26  ALT 45* 35 31  ALKPHOS 96 84 78  BILITOT 0.8 1.5* 1.2  PROT 6.5 6.3* 6.3*  ALBUMIN 2.8* 3.2* 3.1*   No results for input(s): LIPASE, AMYLASE in the last 168 hours. No results for input(s): AMMONIA in the last 168 hours. CBC: Recent Labs  Lab 12/15/18 1229 12/16/18 0157 12/17/18 0459 12/18/18 0405  WBC 12.2* 10.7* 16.6* 26.4*  NEUTROABS 10.2*  --   --   --   HGB 8.8* 9.1* 7.8* 8.5*  HCT 28.6* 30.0* 24.9* 27.3*  MCV 101.8* 104.5* 102.0* 101.5*  PLT 221 160 137* 157   Cardiac Enzymes: No results for input(s): CKTOTAL, CKMB, CKMBINDEX, TROPONINI in the last 168 hours. CBG: Recent Labs  Lab 12/17/18 1145 12/17/18 1713 12/17/18 1954 12/17/18 2342 12/18/18 0716  GLUCAP 300* 91 117* 132* 161*    Iron Studies:  Recent Labs    12/17/18 0804  IRON 39  TIBC 183*  FERRITIN 989*   Studies/Results: No results found.  Medications: Infusions: . sodium chloride    . sodium chloride    . piperacillin-tazobactam (ZOSYN)  IV Stopped (12/18/18 0132)    Scheduled Medications: . Chlorhexidine Gluconate Cloth  6 each Topical Q0600  . Darbepoetin Alfa  60 mcg Intravenous Q7 days  .  guaiFENesin  1,200 mg Oral BID  . insulin aspart  0-20 Units Subcutaneous Q4H  . insulin aspart  3 Units Subcutaneous TID WC  . insulin detemir  10 Units Subcutaneous Daily  . metoprolol tartrate  12.5 mg Oral BID  . pantoprazole (PROTONIX) IV  40 mg Intravenous Q24H  . predniSONE  40 mg Oral Q breakfast  . sevelamer carbonate  3,200 mg Oral TID WC  . tacrolimus  0.5 mg Oral q morning - 10a    have reviewed scheduled and prn medications.  Physical Exam: General:NAD, comfortable Heart:RRR, s1s2 nl Lungs: Some basal crackles, no wheezing, respiratory effort normal. Abdomen:soft, Non-tender, non-distended Extremities:No edema Dialysis Access: Left upper extremity AV fistula has good thrill and bruit Neurology: Alert, awake and  following commands.   Prasad  12/18/2018,10:25 AM  LOS: 3 days  Pager: [phone]

## 2018-12-18 NOTE — Progress Notes (Signed)
PT Cancellation Note  Patient Details Name: [name] MRN: [id number] DOB: [birth year]   Cancelled Treatment:    Reason Eval/Treat Not Completed: Patient at procedure or test/unavailable.  Patient receiving dialysis, will check back in PM.   10:32 AM, 12/18/18 Lonell Grandchild, MPT Physical Therapist with [facility] [phone] office [phone] mobile phone

## 2018-12-18 NOTE — Procedures (Signed)
    HEMODIALYSIS TREATMENT NOTE:  4 hour low-heparin dialysis completed via left forearm AVF (15g/antegrade).  Goal challenge requested by Dr. Carolin Sicks.  3L removed without interruption in ultrafiltration.  All blood was returned and hemostasis was achieved in 10 minutes.  [name], RN

## 2018-12-19 LAB — BASIC METABOLIC PANEL
Anion gap: 13 (ref 5–15)
BUN: 37 mg/dL — ABNORMAL HIGH (ref 8–23)
CO2: 27 mmol/L (ref 22–32)
Calcium: 8.2 mg/dL — ABNORMAL LOW (ref 8.9–10.3)
Chloride: 93 mmol/L — ABNORMAL LOW (ref 98–111)
Creatinine, Ser: 4.09 mg/dL — ABNORMAL HIGH (ref 0.44–1.00)
GFR calc Af Amer: 13 mL/min — ABNORMAL LOW (ref >60)
GFR calc non Af Amer: 11 mL/min — ABNORMAL LOW (ref >60)
Glucose, Bld: 244 mg/dL — ABNORMAL HIGH (ref 70–99)
Potassium: 4.6 mmol/L (ref 3.5–5.1)
Sodium: 133 mmol/L — ABNORMAL LOW (ref 135–145)

## 2018-12-19 LAB — GLUCOSE, CAPILLARY
Glucose-Capillary: 256 mg/dL — ABNORMAL HIGH (ref 70–99)
Glucose-Capillary: 220 mg/dL — ABNORMAL HIGH (ref 70–99)

## 2018-12-19 LAB — CBC
HCT: 29.6 % — ABNORMAL LOW (ref 36.0–46.0)
Hemoglobin: 9.4 g/dL — ABNORMAL LOW (ref 12.0–15.0)
MCH: 31.8 pg (ref 26.0–34.0)
MCHC: 31.8 g/dL (ref 30.0–36.0)
MCV: 100 fL (ref 80.0–100.0)
Platelets: 162 10*3/uL (ref 150–400)
RBC: 2.96 MIL/uL — ABNORMAL LOW (ref 3.87–5.11)
RDW: 13.2 % (ref 11.5–15.5)
WBC: 14.2 10*3/uL — ABNORMAL HIGH (ref 4.0–10.5)
nRBC: 0 % (ref 0.0–0.2)

## 2018-12-19 LAB — PHOSPHORUS: Phosphorus: 3.9 mg/dL (ref 2.5–4.6)

## 2018-12-19 MED ORDER — CEFEPIME IV (FOR PTA / DISCHARGE USE ONLY): 1.0000 g | INTRAVENOUS | 0 refills | Status: AC

## 2018-12-19 MED ORDER — GUAIFENESIN ER 600 MG PO TB12: 1200.0000 mg | ORAL_TABLET | Freq: Two times a day (BID) | ORAL | 0 refills | Status: AC

## 2018-12-19 NOTE — TOC Transition Note (Signed)
Transition of Care [facility]) - CM/SW Discharge Note   Patient Details  Name: [name] MRN: [id number] Date of Birth: 06/04/1955  Transition of Care [facility]) CM/SW Contact:  Shade Flood, LCSW Phone Number: 12/19/2018, 12:02 PM   Clinical Narrative:     Pt being discharged home today. PT had recommended HH PT but LCSW unable to find a [employer] agency that can accept pt's insurance. Discussed option of outpatient PT referral but pt was not interested. She indicated that she would do some exercises at home with family. LCSW asked PT to provide pt with some handouts on things she can do.   Scheduled pt PCP follow up appointment for next week. Updated pt's RN.  There are no other TOC needs for dc.   Barriers to Discharge: No Barriers Identified   Patient Goals and CMS Choice        Expected Discharge Plan and Services           Expected Discharge Date: 12/19/18                                    Prior Living Arrangements/Services                       Activities of Daily Living Home Assistive Devices/Equipment: Bathtub lift, Bedside commode/3-in-1, Eyeglasses, CBG Meter, Grab bars around toilet ADL Screening (condition at time of admission) Patient's cognitive ability adequate to safely complete daily activities?: Yes Is the patient deaf or have difficulty hearing?: No Does the patient have difficulty seeing, even when wearing glasses/contacts?: No Does the patient have difficulty concentrating, remembering, or making decisions?: No Patient able to express need for assistance with ADLs?: Yes Does the patient have difficulty dressing or bathing?: Yes Independently performs ADLs?: Yes (appropriate for developmental age) Does the patient have difficulty walking or climbing stairs?: Yes Weakness of Legs: None Weakness of Arms/Hands: None  Permission Sought/Granted                  Emotional Assessment              Admission diagnosis:  Hypoxia  [R09.02] ESRD (end stage renal disease) ([place]) [N18.6] Atypical pneumonia [J18.9] Elevated d-dimer [R79.89] Acute hypoxemic respiratory failure ([place]) [J96.01] Patient Active Problem List   Diagnosis Date Noted  . Acute hypoxemic respiratory failure ([place]) 12/16/2018  . Dyspnea 12/15/2018  . Lung collapse 12/15/2018  . HCAP (healthcare-associated pneumonia)   . Sepsis due to undetermined organism ([place]) 12/02/2018  . Lobar pneumonia ([place]) 12/02/2018  . Acute respiratory failure with hypoxia ([place]) 12/02/2018  . Hydronephrosis   . Abnormal LFTs (liver function tests) 03/11/2018  . Type 2 diabetes mellitus ([place]) 03/11/2018  . Valvular heart disease 03/11/2018  . Chronic kidney disease on chronic dialysis ([place]) 11/13/2017  . Leukocytosis 09/21/2017  . Generalized weakness 09/21/2017  . Hyperlipidemia associated with type 2 diabetes mellitus ([place]) 02/22/2017  . ESRD (end stage renal disease) ([place]) 08/31/16  . 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 Encounter for aftercare following kidney transplant 05/22/2016  . Chronic diastolic heart failure ([place]) 06/08/2015  . Aortic valve sclerosis 06/08/2015  . Anemia of chronic disease 07/03/2013  . Essential hypertension 12/18/2010   PCP:  Sharion Balloon, FNP Pharmacy:   [facility] [address], [place] - [address] [place] [address] Phone: [phone] Fax: [phone]  Social Determinants of Health (SDOH) Interventions    Readmission Risk Interventions Readmission Risk Prevention Plan 12/19/2018 12/03/2018  Transportation Screening Complete Complete  PCP or Specialist Appt within 3-5 Days Complete Not Complete  HRI or Home Care Consult Not Complete Complete  HRI or Home Care Consult comments Cannot find a [place] agency that will take pt's insurance -  Social Work Consult for [place] Planning/Counseling Complete Complete  Palliative Care Screening Not Applicable Not Complete   Medication Review Press photographer) Complete Complete  Some recent data might be hidden    Final next level of care: Home/Self Care Barriers to Discharge: No Barriers Identified   Patient Goals and CMS Choice        Discharge Placement                       Discharge Plan and Services                                     Social Determinants of Health (SDOH) Interventions     Readmission Risk Interventions Readmission Risk Prevention Plan 12/19/2018 12/03/2018  Transportation Screening Complete Complete  PCP or Specialist Appt within 3-5 Days Complete Not Complete  HRI or [place] Not Complete Complete  HRI or Home Care Consult comments Cannot find a [place] agency that will take pt's insurance -  Social Work Consult for [place] Planning/Counseling Complete Complete  Palliative Care Screening Not Applicable Not Complete  Medication Review Press photographer) Complete Complete  Some recent data might be hidden

## 2018-12-19 NOTE — Progress Notes (Signed)
Subjective: She says she feels better.  I think she is probably improved at least partially because of volume removal yesterday with dialysis.  She is off oxygen.  She is been able to get up and move around.  Objective: Vital signs in last 24 hours: Temp:  [98 F (36.7 C)-98.7 F (37.1 C)] 98.7 F (37.1 C) (10/15 0723) Pulse Rate:  [66-87] 72 (10/15 0723) Resp:  [8-25] 8 (10/15 0723) BP: (119-146)/(53-84) 141/54 (10/15 0600) SpO2:  [91 %-99 %] 98 % (10/15 0723) Weight:  [68.5 kg-72 kg] 68.5 kg (10/15 0500) Weight change: 0 kg Last BM Date: 12/18/18  Intake/Output from previous day: 10/14 0701 - 10/15 0700 In: 50 [IV Piggyback:50] Out: 3000   PHYSICAL EXAM General appearance: alert, cooperative and no distress Resp: clear to auscultation bilaterally Cardio: regular rate and rhythm, S1, S2 normal, no murmur, click, rub or gallop GI: soft, non-tender; bowel sounds normal; no masses,  no organomegaly Extremities: extremities normal, atraumatic, no cyanosis or edema  Lab Results:  Results for orders placed or performed during the hospital encounter of 12/15/18 (from the past 48 hour(s))  Vitamin B12     Status: None   Collection Time: 12/17/18  8:04 AM  Result Value Ref Range   Vitamin B-12 683 180 - 914 pg/mL    Comment: (NOTE) This assay is not validated for testing neonatal or myeloproliferative syndrome specimens for Vitamin B12 levels. Performed at [facility], [address] [place], [place] [address]   Folate     Status: None   Collection Time: 12/17/18  8:04 AM  Result Value Ref Range   Folate 32.1 >5.9 ng/mL    Comment: RESULTS CONFIRMED BY MANUAL DILUTION Performed at [facility], [address], [place], [place] [address]   Iron and TIBC     Status: Abnormal   Collection Time: 12/17/18  8:04 AM  Result Value Ref Range   Iron 39 28 - 170 ug/dL   TIBC 183 (L) 250 - 450 ug/dL   Saturation Ratios 21 10.4 - 31.8 %   UIBC 144 ug/dL    Comment: Performed at  [facility], [address], [place], [place] [address]  Ferritin     Status: Abnormal   Collection Time: 12/17/18  8:04 AM  Result Value Ref Range   Ferritin 989 (H) 11 - 307 ng/mL    Comment: Performed at [facility], [address], [place], [place] [address]  Reticulocytes     Status: Abnormal   Collection Time: 12/17/18  8:04 AM  Result Value Ref Range   Retic Ct Pct 2.5 0.4 - 3.1 %   RBC. 2.32 (L) 3.87 - 5.11 MIL/uL   Retic Count, Absolute 58.9 19.0 - 186.0 K/uL   Immature Retic Fract 12.8 2.3 - 15.9 %    Comment: Performed at [facility], [address], [place], [place] [address]  Glucose, capillary     Status: Abnormal   Collection Time: 12/17/18 11:45 AM  Result Value Ref Range   Glucose-Capillary 300 (H) 70 - 99 mg/dL  Glucose, capillary     Status: None   Collection Time: 12/17/18  5:13 PM  Result Value Ref Range   Glucose-Capillary 91 70 - 99 mg/dL  Glucose, capillary     Status: Abnormal   Collection Time: 12/17/18  7:54 PM  Result Value Ref Range   Glucose-Capillary 117 (H) 70 - 99 mg/dL  Glucose, capillary     Status: Abnormal   Collection Time: 12/17/18 11:42  PM  Result Value Ref Range   Glucose-Capillary 132 (H) 70 - 99 mg/dL  CBC     Status: Abnormal   Collection Time: 12/18/18  4:05 AM  Result Value Ref Range   WBC 26.4 (H) 4.0 - 10.5 K/uL   RBC 2.69 (L) 3.87 - 5.11 MIL/uL   Hemoglobin 8.5 (L) 12.0 - 15.0 g/dL   HCT 27.3 (L) 36.0 - 46.0 %   MCV 101.5 (H) 80.0 - 100.0 fL   MCH 31.6 26.0 - 34.0 pg   MCHC 31.1 30.0 - 36.0 g/dL   RDW 13.7 11.5 - 15.5 %   Platelets 157 150 - 400 K/uL   nRBC 0.0 0.0 - 0.2 %    Comment: Performed at [facility], [address] [place], [place] [address]  Comprehensive metabolic panel     Status: Abnormal   Collection Time: 12/18/18  4:05 AM  Result Value Ref Range   Sodium 133 (L) 135 - 145 mmol/L   Potassium 4.8 3.5 - 5.1 mmol/L   Chloride 92 (L) 98 - 111 mmol/L   CO2 24 22 - 32 mmol/L   Glucose, Bld 127 (H) 70 - 99  mg/dL   BUN 56 (H) 8 - 23 mg/dL   Creatinine, Ser 5.96 (H) 0.44 - 1.00 mg/dL   Calcium 8.7 (L) 8.9 - 10.3 mg/dL   Total Protein 6.3 (L) 6.5 - 8.1 g/dL   Albumin 3.1 (L) 3.5 - 5.0 g/dL   AST 26 15 - 41 U/L   ALT 31 0 - 44 U/L   Alkaline Phosphatase 78 38 - 126 U/L   Total Bilirubin 1.2 0.3 - 1.2 mg/dL   GFR calc non Af Amer 7 (L) >60 mL/min   GFR calc Af Amer 8 (L) >60 mL/min   Anion gap 17 (H) 5 - 15    Comment: Performed at [facility], [address] [place], [place] [address]  Magnesium     Status: None   Collection Time: 12/18/18  4:05 AM  Result Value Ref Range   Magnesium 2.4 1.7 - 2.4 mg/dL    Comment: Performed at [facility], [address], [place], [place] [address]  Glucose, capillary     Status: Abnormal   Collection Time: 12/18/18  7:16 AM  Result Value Ref Range   Glucose-Capillary 161 (H) 70 - 99 mg/dL  Occult blood card to lab, stool RN will collect     Status: None   Collection Time: 12/18/18  8:42 AM  Result Value Ref Range   Fecal Occult Bld NEGATIVE NEGATIVE    Comment: Performed at [facility], [address], [place], [place] [address]  Glucose, capillary     Status: Abnormal   Collection Time: 12/18/18 11:11 AM  Result Value Ref Range   Glucose-Capillary 159 (H) 70 - 99 mg/dL  Glucose, capillary     Status: Abnormal   Collection Time: 12/18/18  4:28 PM  Result Value Ref Range   Glucose-Capillary 153 (H) 70 - 99 mg/dL  Glucose, capillary     Status: Abnormal   Collection Time: 12/18/18  8:59 PM  Result Value Ref Range   Glucose-Capillary 204 (H) 70 - 99 mg/dL   Comment 1 Notify RN    Comment 2 Document in Chart   Basic metabolic panel     Status: Abnormal   Collection Time: 12/19/18  5:08 AM  Result Value Ref Range   Sodium 133 (L) 135 - 145 mmol/L   Potassium 4.6 3.5 -  5.1 mmol/L   Chloride 93 (L) 98 - 111 mmol/L   CO2 27 22 - 32 mmol/L   Glucose, Bld 244 (H) 70 - 99 mg/dL   BUN 37 (H) 8 - 23 mg/dL   Creatinine, Ser 4.09 (H) 0.44 - 1.00  mg/dL   Calcium 8.2 (L) 8.9 - 10.3 mg/dL   GFR calc non Af Amer 11 (L) >60 mL/min   GFR calc Af Amer 13 (L) >60 mL/min   Anion gap 13 5 - 15    Comment: Performed at [facility], [address], [place], [place] [address]  CBC     Status: Abnormal   Collection Time: 12/19/18  5:08 AM  Result Value Ref Range   WBC 14.2 (H) 4.0 - 10.5 K/uL   RBC 2.96 (L) 3.87 - 5.11 MIL/uL   Hemoglobin 9.4 (L) 12.0 - 15.0 g/dL   HCT 29.6 (L) 36.0 - 46.0 %   MCV 100.0 80.0 - 100.0 fL   MCH 31.8 26.0 - 34.0 pg   MCHC 31.8 30.0 - 36.0 g/dL   RDW 13.2 11.5 - 15.5 %   Platelets 162 150 - 400 K/uL   nRBC 0.0 0.0 - 0.2 %    Comment: Performed at [facility], [address] [place],  [address]  Phosphorus     Status: None   Collection Time: 12/19/18  5:08 AM  Result Value Ref Range   Phosphorus 3.9 2.5 - 4.6 mg/dL    Comment: Performed at [facility], [address] [place], [place] [address]  Glucose, capillary     Status: Abnormal   Collection Time: 12/19/18  7:17 AM  Result Value Ref Range   Glucose-Capillary 256 (H) 70 - 99 mg/dL    ABGS No results for input(s): PHART, PO2ART, TCO2, HCO3 in the last 72 hours.  Invalid input(s): PCO2 CULTURES Recent Results (from the past 240 hour(s))  SARS Coronavirus 2 by RT PCR (hospital order, performed in [facility] hospital lab) Nasopharyngeal Nasopharyngeal Swab     Status: None   Collection Time: 12/15/18  1:00 PM   Specimen: Nasopharyngeal Swab  Result Value Ref Range Status   SARS Coronavirus 2 NEGATIVE NEGATIVE Final    Comment: (NOTE) If result is NEGATIVE SARS-CoV-2 target nucleic acids are NOT DETECTED. The SARS-CoV-2 RNA is generally detectable in upper and lower  respiratory specimens during the acute phase of infection. The lowest  concentration of SARS-CoV-2 viral copies this assay can detect is 250  copies / mL. A negative result does not preclude SARS-CoV-2 infection  and should not be used as the sole basis for treatment or  other  patient management decisions.  A negative result may occur with  improper specimen collection / handling, submission of specimen other  than nasopharyngeal swab, presence of viral mutation(s) within the  areas targeted by this assay, and inadequate number of viral copies  (<250 copies / mL). A negative result must be combined with clinical  observations, patient history, and epidemiological information. If result is POSITIVE SARS-CoV-2 target nucleic acids are DETECTED. The SARS-CoV-2 RNA is generally detectable in upper and lower  respiratory specimens dur ing the acute phase of infection.  Positive  results are indicative of active infection with SARS-CoV-2.  Clinical  correlation with patient history and other diagnostic information is  necessary to determine patient infection status.  Positive results do  not rule out bacterial infection or co-infection with other viruses. If result is PRESUMPTIVE POSTIVE SARS-CoV-2 nucleic acids MAY  BE PRESENT.   A presumptive positive result was obtained on the submitted specimen  and confirmed on repeat testing.  While 2019 novel coronavirus  (SARS-CoV-2) nucleic acids may be present in the submitted sample  additional confirmatory testing may be necessary for epidemiological  and / or clinical management purposes  to differentiate between  SARS-CoV-2 and other Sarbecovirus currently known to infect humans.  If clinically indicated additional testing with an alternate test  methodology [phone]) is advised. The SARS-CoV-2 RNA is generally  detectable in upper and lower respiratory sp ecimens during the acute  phase of infection. The expected result is Negative. Fact Sheet for Patients:  StrictlyIdeas.no Fact Sheet for Healthcare Providers: BankingDealers.co.za This test is not yet approved or cleared by the [place] FDA and has been authorized for detection and/or diagnosis of  SARS-CoV-2 by FDA under an Emergency Use Authorization (EUA).  This EUA will remain in effect (meaning this test can be used) for the duration of the COVID-19 declaration under Section 564(b)(1) of the Act, 21 U.S.C. section 360bbb-3(b)(1), unless the authorization is terminated or revoked sooner. Performed at [facility], [address], [place], [place] [address]   Blood Culture (routine x 2)     Status: None (Preliminary result)   Collection Time: 12/15/18  2:11 PM   Specimen: BLOOD RIGHT FOREARM  Result Value Ref Range Status   Specimen Description   Final    BLOOD RIGHT FOREARM BOTTLES DRAWN AEROBIC AND ANAEROBIC   Special Requests Blood Culture adequate volume  Final   Culture   Final    NO GROWTH 4 DAYS Performed at [facility], [address] [place], [place] [address]    Report Status PENDING  Incomplete  Blood Culture (routine x 2)     Status: None (Preliminary result)   Collection Time: 12/15/18  3:26 PM   Specimen: Right Antecubital; Blood  Result Value Ref Range Status   Specimen Description   Final    RIGHT ANTECUBITAL BOTTLES DRAWN AEROBIC AND ANAEROBIC   Special Requests Blood Culture adequate volume  Final   Culture   Final    NO GROWTH 4 DAYS Performed at [facility], [address], [place], [place] [address]    Report Status PENDING  Incomplete   Studies/Results: No results found.  Medications:  Prior to Admission:  Medications Prior to Admission  Medication Sig Dispense Refill Last Dose  . acetaminophen (TYLENOL) 500 MG tablet Take 500 mg by mouth every 6 (six) hours as needed for moderate pain or headache.      Marland Kitchen amLODipine (NORVASC) 5 MG tablet Take 1 tablet by mouth nightly (Patient taking differently: Take 5 mg by mouth at bedtime. ) 90 tablet 0 12/14/2018 at Unknown time  . aspirin (ASPIR-LOW) 81 MG EC tablet Take 81 mg by mouth daily.    12/14/2018 at 930  . B Complex-C-Folic Acid (RENA-VITE RX) 1 MG TABS Take 1 tablet by mouth daily.    12/14/2018 at Unknown time  . insulin degludec (TRESIBA FLEXTOUCH) 100 UNIT/ML SOPN FlexTouch Pen Inject 0.1 mLs (10 Units total) into the skin daily at 10 pm. 12 pen 2 12/14/2018 at Unknown time  . isosorbide mononitrate (IMDUR) 60 MG 24 hr tablet Take 1 tablet by mouth once daily (Patient taking differently: Take 60 mg by mouth daily. ) 90 tablet 0 12/14/2018 at Unknown time  . lidocaine-prilocaine (EMLA) cream Apply 1 application topically as needed (for dialysis).   0   . meclizine (ANTIVERT) 25  MG tablet Take 1 tablet (25 mg total) by mouth 2 (two) times daily as needed for dizziness. 30 tablet 0 12/14/2018 at Unknown time  . metoprolol tartrate (LOPRESSOR) 25 MG tablet Take 1/2 (one-half) tablet by mouth twice daily (Patient taking differently: Take 12.5 mg by mouth 2 (two) times daily. ) 90 tablet 1 12/14/2018 at 2100  . ondansetron (ZOFRAN ODT) 4 MG disintegrating tablet 4mg  ODT q4 hours prn nausea/vomit (Patient taking differently: Take 4 mg by mouth 2 (two) times daily. 4mg  ODT q4 hours prn nausea/vomit) 12 tablet 0   . predniSONE (DELTASONE) 5 MG tablet TAKE 1 TABLET BY MOUTH ONCE DAILY WITH BREAKFAST 90 tablet 0 12/14/2018 at Unknown time  . PROGRAF 0.5 MG capsule Take 0.5 mg by mouth daily. Take 0.5mg  by mouth each morning.  0 12/14/2018 at Unknown time  . rosuvastatin (CRESTOR) 20 MG tablet Take 1 tablet by mouth once daily (Patient taking differently: Take 20 mg by mouth daily. ) 90 tablet 1 12/14/2018 at Unknown time  . sevelamer carbonate (RENVELA) 800 MG tablet Take 800 mg by mouth See admin instructions. 4 tablets with meals(3 times daily) and 2 tablets with snacks(twice daily). Total of 16 tablets daily.   12/14/2018 at Unknown time  . triamcinolone ointment (KENALOG) 0.5 % Apply 1 application topically 2 (two) times daily. 60 g 0   . TRULICITY 1.5 0000000 SOPN INJECT 1 DOSE (1.5 MG) SUBCUTANEOUSLY ONCE A WEEK (Patient taking differently: Inject 1.5 mg into the skin once a week. ) 12  mL 1 Past Week at Unknown time  . albuterol (PROVENTIL HFA;VENTOLIN HFA) 108 (90 Base) MCG/ACT inhaler Inhale 1-2 puffs into the lungs every 6 (six) hours as needed for wheezing or shortness of breath. (Patient not taking: Reported on 12/03/2018) 1 Inhaler 0 Not Taking at Unknown time  . fluticasone (FLONASE) 50 MCG/ACT nasal spray Place 2 sprays into both nostrils daily. (Patient not taking: Reported on 12/03/2018) 16 g 6 Not Taking at Unknown time   Scheduled: . Chlorhexidine Gluconate Cloth  6 each Topical Q0600  . Darbepoetin Alfa  60 mcg Intravenous Q7 days  . guaiFENesin  1,200 mg Oral BID  . insulin aspart  0-20 Units Subcutaneous TID WC  . insulin aspart  3 Units Subcutaneous TID WC  . insulin detemir  10 Units Subcutaneous Daily  . metoprolol tartrate  12.5 mg Oral BID  . pantoprazole (PROTONIX) IV  40 mg Intravenous Q24H  . predniSONE  40 mg Oral Q breakfast  . sevelamer carbonate  3,200 mg Oral TID WC  . tacrolimus  0.5 mg Oral q morning - 10a   Continuous: . sodium chloride    . sodium chloride    . piperacillin-tazobactam (ZOSYN)  IV 3.375 g (12/18/18 2140)   [id number] chloride, sodium chloride, acetaminophen **OR** acetaminophen, heparin, ipratropium-albuterol, lidocaine (PF), lidocaine-prilocaine, meclizine, ondansetron **OR** ondansetron (ZOFRAN) IV, pentafluoroprop-tetrafluoroeth, polyethylene glycol, sevelamer carbonate  Assesment: She was admitted with shortness of breath.  She has pneumonia presumably from aspiration and had collapse of the left lower lobe from that.  She is much improved.  She had acute hypoxic respiratory failure that was likely related to pneumonia and volume overload.  She is off oxygen now.  She is being treated for aspiration pneumonia.  She says that she develops hallucinations with oral antibiotics.  She has renal failure and had dialysis yesterday.  Volume was removed and she does seem to have improved   Active Problems:   Dyspnea   Lung  collapse   Acute hypoxemic respiratory failure (HCC)    Plan: Discussed with Dr. Manuella Ghazi.  I think she is okay for discharge.  She is going to receive IV antibiotics after dialysis.  She will get a dose today and then a dose after her next dialysis and I think that she is probably going to be adequate treatment.  She will need follow-up CT in about a month.  I will be happy to see her in my office in about a month and have her get the CT from there.    LOS: 4 days   Alonza Bogus 12/19/2018, 7:38 AM

## 2018-12-19 NOTE — Discharge Summary (Signed)
Physician Discharge Summary  Chyenne R Muellner MRN:[id number] DOB: 09/18/1955 DOA: 12/15/2018  PCP: Hawks, Christy A, FNP  Admit date: 12/15/2018  Discharge date: 12/19/2018  Admitted From: Home  Disposition:  Home  Recommendations for Outpatient Follow-up:  1. Follow up with PCP in 1-2 weeks 2. Follow-up with Dr. Hawkins in 1 month for reevaluation and repeat CT chest at that time 3. Continue on home prednisone 5 mg daily as prior 4. Finish a course of antibiotic treatment with IV cefepime to be given during dialysis on 12/20/2018.  Apparently, patient cannot tolerate oral antibiotics as this appears to give her hallucinations.  Home Health: Yes with PT  Equipment/Devices: None  Discharge Condition: Stable  CODE STATUS: Full  Diet recommendation: Heart Healthy/carb modified  Brief/Interim Summary: Per HPI: Sue Blackwellis a 63 y.o.femalewith medical history significant forESRD,diastolic congestive heart failure and pulmonary hypertension, coronary artery disease, hypertension,, diabetes mellitus.Patient presented to the ED with complaints of difficulty breathing that started at about 2 AM today. Patient recorded her O2 sats with a home O2 device and it was 74% on room air. She denies any other respiratory symptoms, no cough, no chest pain. She has chronic stable mild lower extremity swelling that is unchanged. She reports compliance with fluid and low-salt intake. She is on dialysis Monday Wednesday Friday, her last HD session was Friday.  Recent hospitalization9/28-9/30-sepsis secondary to pneumoniawith acute respiratory failure, treated with broad-spectrum antibiotics.  10/12:Patient is noted to have some findings of lung collapse with suspected aspiration for which we will continue patient on Zosyn for treatment. Appreciate pulmonology evaluation and will start on Mucomyst due to some mucus plugging associated with aspiration event that may have led to lung  collapse. She also appears to be volume overloaded and will receive hemodialysis today with nephrology.  10/13:Patient appears to be doing well this morning with no nausea or vomiting, shortness of breath or chest pain. She is currently on 2 L nasal cannula with no acute events noted overnight. Her blood glucose is elevated and her hemoglobin is downtrending. She is noted to have iron deficiency anemia.  10/14: Patient improving, but still has some shortness of breath.  She is due for hemodialysis today.  Blood glucose levels have improved and anemia levels have remained stable.  Plan to wean oxygen today and have PT evaluation for ambulation as she does not wear oxygen at home.  10/15: Patient is much better this morning and will receive her dose of Zosyn prior to discharge.  She is stable for discharge and will have home health physical therapy care established.  She has been on room air with good oxygen saturations in no distress.  She will need repeat CT chest in the next 1 month to ensure that the pneumonia has resolved and there are no other findings.  She will follow-up with pulmonology, Dr. Hawkins.  She will have cefepime on 10/16 after dialysis as her last dose of treatment.  Patient received hemodialysis on 10/14 with no complications noted and should continue her usual schedule MWF.  Discharge Diagnoses:  Active Problems:   Dyspnea   Lung collapse   Acute hypoxemic respiratory failure (HCC)  Principal discharge diagnosis: Acute hypoxemic respiratory failure related to aspiration pneumonia with associated mucous plugging and lung collapse.  Discharge Instructions  Discharge Instructions    Diet - low sodium heart healthy   Complete by: As directed    Home infusion instructions [facility] May follow [facility] Dosing Protocol; May administer Cathflo   as needed to maintain patency of vascular access device.; Flushing of vascular access device: per [facility] Protocol: 0.9% NaCl  pre/post medica...   Complete by: As directed    Instructions: May follow [facility] Dosing Protocol   Instructions: May administer Cathflo as needed to maintain patency of vascular access device.   Instructions: Flushing of vascular access device: per [facility] Protocol: 0.9% NaCl pre/post medication administration and prn patency; Heparin 100 u/ml, 5ml for implanted ports and Heparin 10u/ml, 5ml for all other central venous catheters.   Instructions: May follow [facility] Anaphylaxis Protocol for First Dose Administration in the home: 0.9% NaCl at 25-50 ml/hr to maintain IV access for protocol meds. Epinephrine 0.3 ml IV/IM PRN and Benadryl 25-50 IV/IM PRN s/s of anaphylaxis.   Instructions: [facility] Infusion Coordinator (RN) to assist per patient IV care needs in the home PRN.   Increase activity slowly   Complete by: As directed      Allergies as of 12/19/2018      Reactions   Other Other (See Comments)   Tape Bruises and tears skin, Paper tape tolerated   Tape Other (See Comments)   Bruises and tears skin Paper tape tolerated Bruises and tears skin Paper tape tolerated Bruises and tears skin Paper tape tolerated      Medication List    TAKE these medications   acetaminophen 500 MG tablet Commonly known as: TYLENOL Take 500 mg by mouth every 6 (six) hours as needed for moderate pain or headache.   albuterol 108 (90 Base) MCG/ACT inhaler Commonly known as: VENTOLIN HFA Inhale 1-2 puffs into the lungs every 6 (six) hours as needed for wheezing or shortness of breath.   amLODipine 5 MG tablet Commonly known as: NORVASC Take 1 tablet by mouth nightly   Aspir-Low 81 MG EC tablet Generic drug: aspirin Take 81 mg by mouth daily.   ceFEPime  IVPB Commonly known as: MAXIPIME Inject 1 g into the vein daily for 1 day. Indication: aspiration pneumonia Last Day of Therapy:  12/20/18 after dialysis Labs - Once weekly:  CBC/D and BMP, Labs - Every other week:  ESR and CRP Start  taking on: December 20, 2018   fluticasone 50 MCG/ACT nasal spray Commonly known as: FLONASE Place 2 sprays into both nostrils daily.   guaiFENesin 600 MG 12 hr tablet Commonly known as: MUCINEX Take 2 tablets (1,200 mg total) by mouth 2 (two) times daily for 7 days.   isosorbide mononitrate 60 MG 24 hr tablet Commonly known as: IMDUR Take 1 tablet by mouth once daily   lidocaine-prilocaine cream Commonly known as: EMLA Apply 1 application topically as needed (for dialysis).   meclizine 25 MG tablet Commonly known as: ANTIVERT Take 1 tablet (25 mg total) by mouth 2 (two) times daily as needed for dizziness.   metoprolol tartrate 25 MG tablet Commonly known as: LOPRESSOR Take 1/2 (one-half) tablet by mouth twice daily What changed: See the new instructions.   ondansetron 4 MG disintegrating tablet Commonly known as: Zofran ODT 4mg ODT q4 hours prn nausea/vomit What changed:   how much to take  how to take this  when to take this   predniSONE 5 MG tablet Commonly known as: DELTASONE TAKE 1 TABLET BY MOUTH ONCE DAILY WITH BREAKFAST   Prograf 0.5 MG capsule Generic drug: tacrolimus Take 0.5 mg by mouth daily. Take 0.5mg by mouth each morning.   Rena-Vite Rx 1 MG Tabs Take 1 tablet by mouth daily.   rosuvastatin   20 MG tablet Commonly known as: CRESTOR Take 1 tablet by mouth once daily   sevelamer carbonate 800 MG tablet Commonly known as: RENVELA Take 800 mg by mouth See admin instructions. 4 tablets with meals(3 times daily) and 2 tablets with snacks(twice daily). Total of 16 tablets daily.   [name] 100 UNIT/ML Sopn FlexTouch Pen Generic drug: insulin degludec Inject 0.1 mLs (10 Units total) into the skin daily at 10 pm.   triamcinolone ointment 0.5 % Commonly known as: KENALOG Apply 1 application topically 2 (two) times daily.   Trulicity 1.5 EX/5.2WU Sopn Generic drug: Dulaglutide INJECT 1 DOSE (1.5 MG) SUBCUTANEOUSLY ONCE A WEEK What  changed: See the new instructions.            Home Infusion Instuctions  (From admission, onward)         Start     Ordered   12/19/18 0000  Home infusion instructions [facility] May follow [facility] Dosing Protocol; May administer Cathflo as needed to maintain patency of vascular access device.; Flushing of vascular access device: per [facility] Protocol: 0.9% NaCl pre/post medica...    Question Answer Comment  Instructions May follow [facility] Dosing Protocol   Instructions May administer Cathflo as needed to maintain patency of vascular access device.   Instructions Flushing of vascular access device: per [facility] Protocol: 0.9% NaCl pre/post medication administration and prn patency; Heparin 100 u/ml, 35m for implanted ports and Heparin 10u/ml, 576mfor all other central venous catheters.   Instructions May follow [facility] Anaphylaxis Protocol for First Dose Administration in the home: 0.9% NaCl at 25-50 ml/hr to maintain IV access for protocol meds. Epinephrine 0.3 ml IV/IM PRN and Benadryl 25-50 IV/IM PRN s/s of anaphylaxis.   Instructions [facility] Infusion Coordinator (RN) to assist per patient IV care needs in the home PRN.      12/19/18 1019         Follow-up Information    HaEvelina Dun, FNP Follow up in 1 week(s).   Specialty: Family Medicine Contact information: [phone]        HaSinda DuMD Follow up in 1 month(s).   Specialty: Pulmonary Disease Contact information: [address] [place] [phone]        Allergies  Allergen Reactions  . Other Other (See Comments)    Tape Bruises and tears skin, Paper tape tolerated  . Tape Other (See Comments)    Bruises and tears skin Paper tape tolerated Bruises and tears skin Paper tape tolerated Bruises and tears skin Paper tape tolerated    Consultations:  Pulmonology   Procedures/Studies: Ct Head Wo  Contrast  Result Date: 11/26/2018 CLINICAL DATA:  Vertigo with 1 week history of dizziness and nausea. Renal failure. EXAM: CT HEAD WITHOUT CONTRAST TECHNIQUE: Contiguous axial images were obtained from the base of the skull through the vertex without intravenous contrast. COMPARISON:  November 26, 2017 FINDINGS: Brain: There is slight age related volume loss. There is no intracranial mass, hemorrhage, extra-axial fluid collection, or midline shift. Brain parenchyma appears unremarkable. No evident acute infarct. Vascular: There is no hyperdense vessel. There is calcification in each distal vertebral artery and carotid siphon region. Skull: Bony calvarium appears intact. Sinuses/Orbits: Visualized paranasal sinuses are clear. Visualized orbits appear symmetric bilaterally. Other: There is chronic opacification of an inferior mastoid air cell on the left. Mastoids elsewhere clear. IMPRESSION: Age related volume loss. Brain parenchyma appears unremarkable. No  mass or hemorrhage. Extensive arterial vascular calcification noted. Opacification in inferior mastoid air cell on the left, chronic. Electronically Signed   By: William  Woodruff III M.D.   On: 11/26/2018 20:13   Ct Angio Chest Pe W/cm &/or Wo Cm  Result Date: 12/15/2018 CLINICAL DATA:  Acute shortness of breath and hypoxia. Dialysis patient. COVID-19 negative. EXAM: CT ANGIOGRAPHY CHEST WITH CONTRAST TECHNIQUE: Multidetector CT imaging of the chest was performed using the standard protocol during bolus administration of intravenous contrast. Multiplanar CT image reconstructions and MIPs were obtained to evaluate the vascular anatomy. CONTRAST:  100mL OMNIPAQUE IOHEXOL 350 MG/ML SOLN COMPARISON:  Chest x-ray from same day. CT chest report dated September 04, 2006. FINDINGS: Cardiovascular: Satisfactory opacification of the pulmonary arteries to the segmental level. No evidence of pulmonary embolism. Mild cardiomegaly. No pericardial effusion. No thoracic  aortic aneurysm or dissection. Coronary, aortic arch, and branch vessel atherosclerotic vascular disease. Mediastinum/Nodes: Mildly enlarged subcarinal node measuring 1.3 cm in short axis, likely reactive. No additional enlarged mediastinal, hilar, or axillary lymph nodes. The thyroid gland, trachea, and esophagus demonstrate no significant findings. Lungs/Pleura: Small to moderate bilateral pleural effusions. Complete collapse of the left lower lobe. Partial collapse of the right lower lobe. Diffuse interlobular septal thickening throughout both lungs. Small area of peribronchovascular ground-glass density in the central right upper lobe. Mild subsegmental atelectasis in the dependent left upper and right middle lobes. No consolidation or pneumothorax. No suspicious pulmonary nodule. Upper Abdomen: No acute abnormality. Musculoskeletal: No chest wall abnormality. No acute or significant osseous findings. Multiple chronic mild thoracic compression deformities. Review of the MIP images confirms the above findings. IMPRESSION: 1.  No evidence of pulmonary embolism. 2. Findings consistent with congestive heart failure. Small to moderate bilateral pleural effusions with diffuse interstitial pulmonary edema. Small area of alveolar pulmonary edema in the central right upper lobe. 3. Complete collapse of the left lower lobe. Partial collapse of the right lower lobe. 4.  Aortic atherosclerosis (ICD10-I70.0). Electronically Signed   By: William T Derry M.D.   On: 12/15/2018 19:23   Dg Chest Port 1 View  Result Date: 12/15/2018 CLINICAL DATA:  Pt reports increasing sob starting around 2am. Last dialysis on Friday and wasn't feeling well then and almost passed out EXAM: PORTABLE CHEST 1 VIEW COMPARISON:  Chest radiograph 12/02/2018 FINDINGS: Stable cardiomediastinal contours with enlarged heart size. Aortic arch calcification. There are diffuse bilateral interstitial opacities which could represent atypical infection or  edema. No pneumothorax or large pleural effusion. No acute finding in the visualized skeleton. IMPRESSION: Diffuse bilateral interstitial opacities which could represent atypical infection or edema. Electronically Signed   By: Nancy  Ballantyne M.D.   On: 12/15/2018 13:03   Dg Chest Port 1 View  Result Date: 12/02/2018 CLINICAL DATA:  Sepsis EXAM: PORTABLE CHEST 1 VIEW COMPARISON:  June 27, 2018 FINDINGS: Heart size is enlarged. There are prominent bilateral interstitial lung markings. There may be small bilateral pleural effusions. There is no pneumothorax. There is no acute osseous abnormality. IMPRESSION: Cardiomegaly with prominent interstitial lung markings may be secondary to pulmonary edema or an atypical infectious process. Electronically Signed   By: Christopher  Green M.D.   On: 12/02/2018 07:56     Discharge Exam: Vitals:   12/19/18 0723 12/19/18 0800  BP:  (!) 104/92  Pulse: 72 69  Resp: (!) 8 12  Temp: 98.7 F (37.1 C)   SpO2: 98% 96%   Vitals:   12/19/18 0500 12/19/18 0600 12/19/18 0723 12/19/18 0800  BP:  (!)   141/54  (!) 104/92  Pulse: 69 67 72 69  Resp: (!) 9 11 (!) 8 12  Temp: 98.1 F (36.7 C)  98.7 F (37.1 C)   TempSrc: Oral  Oral   SpO2: 98% 99% 98% 96%  Weight: 68.5 kg     Height:        General: Pt is alert, awake, not in acute distress Cardiovascular: RRR, S1/S2 +, no rubs, no gallops Respiratory: CTA bilaterally, no wheezing, no rhonchi Abdominal: Soft, NT, ND, bowel sounds + Extremities: no edema, no cyanosis    The results of significant diagnostics from this hospitalization (including imaging, microbiology, ancillary and laboratory) are listed below for reference.     Microbiology: Recent Results (from the past 240 hour(s))  SARS Coronavirus 2 by RT PCR (hospital order, performed in [facility] hospital lab) Nasopharyngeal Nasopharyngeal Swab     Status: None   Collection Time: 12/15/18  1:00 PM   Specimen: Nasopharyngeal Swab  Result Value  Ref Range Status   SARS Coronavirus 2 NEGATIVE NEGATIVE Final    Comment: (NOTE) If result is NEGATIVE SARS-CoV-2 target nucleic acids are NOT DETECTED. The SARS-CoV-2 RNA is generally detectable in upper and lower  respiratory specimens during the acute phase of infection. The lowest  concentration of SARS-CoV-2 viral copies this assay can detect is 250  copies / mL. A negative result does not preclude SARS-CoV-2 infection  and should not be used as the sole basis for treatment or other  patient management decisions.  A negative result may occur with  improper specimen collection / handling, submission of specimen other  than nasopharyngeal swab, presence of viral mutation(s) within the  areas targeted by this assay, and inadequate number of viral copies  (<250 copies / mL). A negative result must be combined with clinical  observations, patient history, and epidemiological information. If result is POSITIVE SARS-CoV-2 target nucleic acids are DETECTED. The SARS-CoV-2 RNA is generally detectable in upper and lower  respiratory specimens dur ing the acute phase of infection.  Positive  results are indicative of active infection with SARS-CoV-2.  Clinical  correlation with patient history and other diagnostic information is  necessary to determine patient infection status.  Positive results do  not rule out bacterial infection or co-infection with other viruses. If result is PRESUMPTIVE POSTIVE SARS-CoV-2 nucleic acids MAY BE PRESENT.   A presumptive positive result was obtained on the submitted specimen  and confirmed on repeat testing.  While 2019 novel coronavirus  (SARS-CoV-2) nucleic acids may be present in the submitted sample  additional confirmatory testing may be necessary for epidemiological  and / or clinical management purposes  to differentiate between  SARS-CoV-2 and other Sarbecovirus currently known to infect humans.  If clinically indicated additional testing with an  alternate test  methodology [phone]) is advised. The SARS-CoV-2 RNA is generally  detectable in upper and lower respiratory sp ecimens during the acute  phase of infection. The expected result is Negative. Fact Sheet for Patients:  StrictlyIdeas.no Fact Sheet for Healthcare Providers: BankingDealers.co.za This test is not yet approved or cleared by the [place] FDA and has been authorized for detection and/or diagnosis of SARS-CoV-2 by FDA under an Emergency Use Authorization (EUA).  This EUA will remain in effect (meaning this test can be used) for the duration of the COVID-19 declaration under Section 564(b)(1) of the Act, 21 U.S.C. section 360bbb-3(b)(1), unless the authorization is terminated or revoked sooner. Performed at [facility], [name] [place],  [place] [address]   Blood Culture (routine x 2)     Status: None (Preliminary result)   Collection Time: 12/15/18  2:11 PM   Specimen: BLOOD RIGHT FOREARM  Result Value Ref Range Status   Specimen Description   Final    BLOOD RIGHT FOREARM BOTTLES DRAWN AEROBIC AND ANAEROBIC   Special Requests Blood Culture adequate volume  Final   Culture   Final    NO GROWTH 4 DAYS Performed at [facility], [address] [place], [place] [address]    Report Status PENDING  Incomplete  Blood Culture (routine x 2)     Status: None (Preliminary result)   Collection Time: 12/15/18  3:26 PM   Specimen: Right Antecubital; Blood  Result Value Ref Range Status   Specimen Description   Final    RIGHT ANTECUBITAL BOTTLES DRAWN AEROBIC AND ANAEROBIC   Special Requests Blood Culture adequate volume  Final   Culture   Final    NO GROWTH 4 DAYS Performed at [facility], [address] [place], [place] [address]    Report Status PENDING  Incomplete     Labs: BNP (last 3 results) Recent Labs    12/15/18 1229  BNP >4,500.0*   Basic Metabolic Panel: Recent Labs  Lab 12/15/18 1229  12/15/18 2307 12/16/18 0157 12/17/18 0459 12/18/18 0405 12/19/18 0508  NA 138  --  137 132* 133* 133*  K 3.9  --  4.9 4.5 4.8 4.6  CL 98  --  99 91* 92* 93*  CO2 27  --  28 22 24 27  GLUCOSE 112*  --  107* 414* 127* 244*  BUN 39*  --  41* 32* 56* 37*  CREATININE 6.72*  --  7.41* 4.39* 5.96* 4.09*  CALCIUM 8.5*  --  8.1* 8.3* 8.7* 8.2*  MG  --  2.2  --   --  2.4  --   PHOS  --   --   --   --   --  3.9   Liver Function Tests: Recent Labs  Lab 12/15/18 1229 12/16/18 0157 12/17/18 0459 12/18/18 0405  AST 47* 37 33 26  ALT 56* 45* 35 31  ALKPHOS 108 96 84 78  BILITOT 0.7 0.8 1.5* 1.2  PROT 7.0 6.5 6.3* 6.3*  ALBUMIN 3.1* 2.8* 3.2* 3.1*   No results for input(s): LIPASE, AMYLASE in the last 168 hours. No results for input(s): AMMONIA in the last 168 hours. CBC: Recent Labs  Lab 12/15/18 1229 12/16/18 0157 12/17/18 0459 12/18/18 0405 12/19/18 0508  WBC 12.2* 10.7* 16.6* 26.4* 14.2*  NEUTROABS 10.2*  --   --   --   --   HGB 8.8* 9.1* 7.8* 8.5* 9.4*  HCT 28.6* 30.0* 24.9* 27.3* 29.6*  MCV 101.8* 104.5* 102.0* 101.5* 100.0  PLT 221 160 137* 157 162   Cardiac Enzymes: No results for input(s): CKTOTAL, CKMB, CKMBINDEX, TROPONINI in the last 168 hours. BNP: Invalid input(s): POCBNP CBG: Recent Labs  Lab 12/18/18 0716 12/18/18 1111 12/18/18 1628 12/18/18 2059 12/19/18 0717  GLUCAP 161* 159* 153* 204* 256*   D-Dimer No results for input(s): DDIMER in the last 72 hours. Hgb A1c No results for input(s): HGBA1C in the last 72 hours. Lipid Profile No results for input(s): CHOL, HDL, LDLCALC, TRIG, CHOLHDL, LDLDIRECT in the last 72 hours. Thyroid function studies No results for input(s): TSH, T4TOTAL, T3FREE, THYROIDAB in the last 72 hours.  Invalid input(s): FREET3 Anemia work up Recent Labs    12/17/18   0804  VITAMINB12 683  FOLATE 32.1  FERRITIN 989*  TIBC 183*  IRON 39  RETICCTPCT 2.5   Urinalysis    Component Value Date/Time   COLORURINE YELLOW  12/02/2018 0622   APPEARANCEUR HAZY (A) 12/02/2018 0622   APPEARANCEUR Cloudy (A) 11/25/2018 1202   LABSPEC 1.013 12/02/2018 0622   PHURINE 7.0 12/02/2018 0622   GLUCOSEU 150 (A) 12/02/2018 0622   HGBUR SMALL (A) 12/02/2018 0622   BILIRUBINUR NEGATIVE 12/02/2018 0622   BILIRUBINUR Negative 11/25/2018 1202   KETONESUR NEGATIVE 12/02/2018 0622   PROTEINUR 100 (A) 12/02/2018 0622   NITRITE NEGATIVE 12/02/2018 0622   LEUKOCYTESUR LARGE (A) 12/02/2018 0622   Sepsis Labs Invalid input(s): PROCALCITONIN,  WBC,  LACTICIDVEN Microbiology Recent Results (from the past 240 hour(s))  SARS Coronavirus 2 by RT PCR (hospital order, performed in [place] hospital lab) Nasopharyngeal Nasopharyngeal Swab     Status: None   Collection Time: 12/15/18  1:00 PM   Specimen: Nasopharyngeal Swab  Result Value Ref Range Status   SARS Coronavirus 2 NEGATIVE NEGATIVE Final    Comment: (NOTE) If result is NEGATIVE SARS-CoV-2 target nucleic acids are NOT DETECTED. The SARS-CoV-2 RNA is generally detectable in upper and lower  respiratory specimens during the acute phase of infection. The lowest  concentration of SARS-CoV-2 viral copies this assay can detect is 250  copies / mL. A negative result does not preclude SARS-CoV-2 infection  and should not be used as the sole basis for treatment or other  patient management decisions.  A negative result may occur with  improper specimen collection / handling, submission of specimen other  than nasopharyngeal swab, presence of viral mutation(s) within the  areas targeted by this assay, and inadequate number of viral copies  (<250 copies / mL). A negative result must be combined with clinical  observations, patient history, and epidemiological information. If result is POSITIVE SARS-CoV-2 target nucleic acids are DETECTED. The SARS-CoV-2 RNA is generally detectable in upper and lower  respiratory specimens dur ing the acute phase of infection.  Positive   results are indicative of active infection with SARS-CoV-2.  Clinical  correlation with patient history and other diagnostic information is  necessary to determine patient infection status.  Positive results do  not rule out bacterial infection or co-infection with other viruses. If result is PRESUMPTIVE POSTIVE SARS-CoV-2 nucleic acids MAY BE PRESENT.   A presumptive positive result was obtained on the submitted specimen  and confirmed on repeat testing.  While 2019 novel coronavirus  (SARS-CoV-2) nucleic acids may be present in the submitted sample  additional confirmatory testing may be necessary for epidemiological  and / or clinical management purposes  to differentiate between  SARS-CoV-2 and other Sarbecovirus currently known to infect humans.  If clinically indicated additional testing with an alternate test  methodology [phone]) is advised. The SARS-CoV-2 RNA is generally  detectable in upper and lower respiratory sp ecimens during the acute  phase of infection. The expected result is Negative. Fact Sheet for Patients:  StrictlyIdeas.no Fact Sheet for Healthcare Providers: BankingDealers.co.za This test is not yet approved or cleared by the [place] FDA and has been authorized for detection and/or diagnosis of SARS-CoV-2 by FDA under an Emergency Use Authorization (EUA).  This EUA will remain in effect (meaning this test can be used) for the duration of the COVID-19 declaration under Section 564(b)(1) of the Act, 21 U.S.C. section 360bbb-3(b)(1), unless the authorization is terminated or revoked sooner. Performed at [facility], [phone] [place], [place] [address]   Blood Culture (routine x 2)     Status: None (Preliminary result)   Collection Time: 12/15/18  2:11 PM   Specimen: BLOOD RIGHT FOREARM  Result Value Ref Range Status   Specimen Description   Final    BLOOD RIGHT FOREARM BOTTLES DRAWN AEROBIC AND  ANAEROBIC   Special Requests Blood Culture adequate volume  Final   Culture   Final    NO GROWTH 4 DAYS Performed at [facility], [address] [place], [place] [address]    Report Status PENDING  Incomplete  Blood Culture (routine x 2)     Status: None (Preliminary result)   Collection Time: 12/15/18  3:26 PM   Specimen: Right Antecubital; Blood  Result Value Ref Range Status   Specimen Description   Final    RIGHT ANTECUBITAL BOTTLES DRAWN AEROBIC AND ANAEROBIC   Special Requests Blood Culture adequate volume  Final   Culture   Final    NO GROWTH 4 DAYS Performed at [facility], [address] [place], [place] [address]    Report Status PENDING  Incomplete     Time coordinating discharge: 35 minutes  SIGNED:    D , DO [facility] 12/19/2018, 10:20 AM  If 7PM-7AM, please contact night-coverage [email or web address] Password [email or web address]  

## 2018-12-19 NOTE — Progress Notes (Signed)
PHARMACY CONSULT NOTE FOR:  OUTPATIENT  PARENTERAL ANTIBIOTIC THERAPY (OPAT)  Indication: aspiration pneumonia Regimen: cefepime 1000 mg IV x 1 dose after dialysis End date: 12/20/18 x 1 dose  IV antibiotic discharge orders are pended. To discharging provider:  please sign these orders via discharge navigator,  Select New Orders & click on the button choice - Manage This Unsigned Work.     Thank you for allowing pharmacy to be a part of this patient's care.  [name] 12/19/2018, 10:11 AM

## 2018-12-19 NOTE — Progress Notes (Addendum)
[name] NEPHROLOGY PROGRESS NOTE  Assessment/ Plan: Pt is a 63 y.o. yo female with hypertension, CHF, CAD, ESRD on HD MWF at [facility], with respiratory failure due to pneumonia and fluid overload.  #Acute respiratory failure with hypoxia: Due to aspiration pneumonia and volume overload.  Clinically improved and currently on room air.  Getting 1 more day of cefepime after dialysis tomorrow.  Plan for discharge today noted.  # ESRD MWF at [facility]: Status post dialysis yesterday for 3 L UF, tolerated well.  Recommend to challenge dry weight to optimize volume status at outpatient dialysis center.  She has AV fistula for dialysis.  Next dialysis tomorrow at OP.  # Anemia due to chronic illness and ESRD: Hemoglobin 8.5, iron saturation 21 with high ferritin level.  Continue ESA.  Holding IV iron while on IV antibiotics.  Monitor CBC.  # Secondary hyperparathyroidism: phosphorus level acceptable.  Continue Renvela.  # HTN/volume: Blood pressure currently acceptable.  Continue to monitor.   Subjective: Seen and examined in ICU.  She feels much better and ready to go home today.  Not on oxygen.  Tolerated dialysis well yesterday.  Denied chest pain, shortness of breath, nausea or vomiting. Objective Vital signs in last 24 hours: Vitals:   12/19/18 0600 12/19/18 0723 12/19/18 0800 12/19/18 1000  BP: (!) 141/54  (!) 104/92 (!) 129/50  Pulse: 67 72 69 64  Resp: 11 (!) 8 12 11   Temp:  98.7 F (37.1 C)    TempSrc:  Oral    SpO2: 99% 98% 96% 97%  Weight:      Height:       Weight change: 0 kg  Intake/Output Summary (Last 24 hours) at 12/19/2018 1059 Last data filed at 12/18/2018 1418 Gross per 24 hour  Intake 50 ml  Output 3000 ml  Net -2950 ml       Labs: Basic Metabolic Panel: Recent Labs  Lab 12/17/18 0459 12/18/18 0405 12/19/18 0508  NA 132* 133* 133*  K 4.5 4.8 4.6  CL 91* 92* 93*  CO2 22 24 27   GLUCOSE 414* 127* 244*  BUN 32* 56* 37*  CREATININE  4.39* 5.96* 4.09*  CALCIUM 8.3* 8.7* 8.2*  PHOS  --   --  3.9   Liver Function Tests: Recent Labs  Lab 12/16/18 0157 12/17/18 0459 12/18/18 0405  AST 37 33 26  ALT 45* 35 31  ALKPHOS 96 84 78  BILITOT 0.8 1.5* 1.2  PROT 6.5 6.3* 6.3*  ALBUMIN 2.8* 3.2* 3.1*   No results for input(s): LIPASE, AMYLASE in the last 168 hours. No results for input(s): AMMONIA in the last 168 hours. CBC: Recent Labs  Lab 12/15/18 1229 12/16/18 0157 12/17/18 0459 12/18/18 0405 12/19/18 0508  WBC 12.2* 10.7* 16.6* 26.4* 14.2*  NEUTROABS 10.2*  --   --   --   --   HGB 8.8* 9.1* 7.8* 8.5* 9.4*  HCT 28.6* 30.0* 24.9* 27.3* 29.6*  MCV 101.8* 104.5* 102.0* 101.5* 100.0  PLT 221 160 137* 157 162   Cardiac Enzymes: No results for input(s): CKTOTAL, CKMB, CKMBINDEX, TROPONINI in the last 168 hours. CBG: Recent Labs  Lab 12/18/18 0716 12/18/18 1111 12/18/18 1628 12/18/18 2059 12/19/18 0717  GLUCAP 161* 159* 153* 204* 256*    Iron Studies:  Recent Labs    12/17/18 0804  IRON 39  TIBC 183*  FERRITIN 989*   Studies/Results: No results found.  Medications: Infusions: . sodium chloride    . sodium chloride    .  piperacillin-tazobactam (ZOSYN)  IV 3.375 g (12/19/18 0858)    Scheduled Medications: . Chlorhexidine Gluconate Cloth  6 each Topical Q0600  . Darbepoetin Alfa  60 mcg Intravenous Q7 days  . guaiFENesin  1,200 mg Oral BID  . insulin aspart  0-20 Units Subcutaneous TID WC  . insulin aspart  3 Units Subcutaneous TID WC  . insulin detemir  10 Units Subcutaneous Daily  . metoprolol tartrate  12.5 mg Oral BID  . pantoprazole (PROTONIX) IV  40 mg Intravenous Q24H  . predniSONE  40 mg Oral Q breakfast  . sevelamer carbonate  3,200 mg Oral TID WC  . tacrolimus  0.5 mg Oral q morning - 10a    have reviewed scheduled and prn medications.  Physical Exam: General:NAD, comfortable Heart:RRR, s1s2 nl Lungs: Clear bilateral, respiratory effort normal, no wheezing Abdomen:soft,  Non-tender, non-distended Extremities:No edema Dialysis Access: Left upper extremity AV fistula has good thrill and bruit Neurology: Alert, awake and following commands.  [name] 12/19/2018,10:59 AM  LOS: 4 days  Pager: [phone]

## 2018-12-20 DIAGNOSIS — N186 End stage renal disease: Secondary | ICD-10-CM | POA: Diagnosis not present

## 2018-12-20 DIAGNOSIS — J189 Pneumonia, unspecified organism: Secondary | ICD-10-CM | POA: Diagnosis not present

## 2018-12-20 DIAGNOSIS — Z992 Dependence on renal dialysis: Secondary | ICD-10-CM | POA: Diagnosis not present

## 2018-12-20 DIAGNOSIS — Z23 Encounter for immunization: Secondary | ICD-10-CM | POA: Diagnosis not present

## 2018-12-20 LAB — CULTURE, BLOOD (ROUTINE X 2)
Culture: NO GROWTH
Culture: NO GROWTH
Special Requests: ADEQUATE
Special Requests: ADEQUATE

## 2018-12-23 DIAGNOSIS — J189 Pneumonia, unspecified organism: Secondary | ICD-10-CM | POA: Diagnosis not present

## 2018-12-23 DIAGNOSIS — Z23 Encounter for immunization: Secondary | ICD-10-CM | POA: Diagnosis not present

## 2018-12-23 DIAGNOSIS — Z992 Dependence on renal dialysis: Secondary | ICD-10-CM | POA: Diagnosis not present

## 2018-12-23 DIAGNOSIS — N186 End stage renal disease: Secondary | ICD-10-CM | POA: Diagnosis not present

## 2018-12-24 ENCOUNTER — Telehealth: Payer: Self-pay | Admitting: Family

## 2018-12-25 ENCOUNTER — Ambulatory Visit (INDEPENDENT_AMBULATORY_CARE_PROVIDER_SITE_OTHER): Payer: BC Managed Care – PPO | Admitting: Family

## 2018-12-25 ENCOUNTER — Encounter: Payer: Self-pay | Admitting: Family

## 2018-12-25 DIAGNOSIS — J9311 Primary spontaneous pneumothorax: Secondary | ICD-10-CM | POA: Diagnosis not present

## 2018-12-25 DIAGNOSIS — N186 End stage renal disease: Secondary | ICD-10-CM

## 2018-12-25 DIAGNOSIS — Z09 Encounter for follow-up examination after completed treatment for conditions other than malignant neoplasm: Secondary | ICD-10-CM | POA: Diagnosis not present

## 2018-12-25 DIAGNOSIS — J189 Pneumonia, unspecified organism: Secondary | ICD-10-CM | POA: Diagnosis not present

## 2018-12-25 DIAGNOSIS — Z992 Dependence on renal dialysis: Secondary | ICD-10-CM | POA: Diagnosis not present

## 2018-12-25 DIAGNOSIS — Z23 Encounter for immunization: Secondary | ICD-10-CM | POA: Diagnosis not present

## 2018-12-25 NOTE — Progress Notes (Signed)
   Virtual Visit via telephone Note Due to COVID-19 pandemic this visit was conducted virtually. This visit type was conducted due to national recommendations for restrictions regarding the COVID-19 Pandemic (e.g. social distancing, sheltering in place) in an effort to limit this patient's exposure and mitigate transmission in our community. All issues noted in this document were discussed and addressed.  A physical exam was not performed with this format.  I connected with [name] on 12/25/18 at 12:40 pm by telephone and verified that I am speaking with the correct person using two identifiers. [name] is currently located at home and no one  is currently with her during visit. The provider, Evelina Dun, FNP is located in their office at time of visit.  I discussed the limitations, risks, security and privacy concerns of performing an evaluation and management service by telephone and the availability of in person appointments. I also discussed with the patient that there may be a patient responsible charge related to this service. The patient expressed understanding and agreed to proceed.   History and Present Illness:  HPI  PT calls the office today for hospital follow up. She went to the ED on 12/15/18 for recurrent pneumonia and lung collapse. She was discharged on 12/19/18 prednisone, IV cefepime, and mucinex. She completed both of these and states her breathing is greatly improved. She denies any cough, fever, SOB. She continues to feel weak and has dialysis MWF.   She states she was told today at dialysis her iron was low and would start iron infusion soon.   Review of Systems  Constitutional: Positive for malaise/fatigue.     Observations/Objective: No SOB or distress noted   Assessment and Plan: 1. Hospital discharge follow-up Hospital notes reviewed  2. ESRD (end stage renal disease) ([place]) Keep appts with dialysis   3. Primary spontaneous pneumothorax Will  repeat ct scan next month - CT Chest Wo Contrast; Future  SOB greatly improved Denies any fevers at this time. Call if SOB, fevers, chest pain, or cough develop.    I discussed the assessment and treatment plan with the patient. The patient was provided an opportunity to ask questions and all were answered. The patient agreed with the plan and demonstrated an understanding of the instructions.   The patient was advised to call back or seek an in-person evaluation if the symptoms worsen or if the condition fails to improve as anticipated.  The above assessment and management plan was discussed with the patient. The patient verbalized understanding of and has agreed to the management plan. Patient is aware to call the clinic if symptoms persist or worsen. Patient is aware when to return to the clinic for a follow-up visit. Patient educated on when it is appropriate to go to the emergency department.   Time call ended:  1:03 pm  I provided 23 minutes of non-face-to-face time during this encounter.    Evelina Dun, FNP

## 2018-12-27 ENCOUNTER — Observation Stay (HOSPITAL_COMMUNITY)
Admission: EM | Admit: 2018-12-27 | Discharge: 2018-12-28 | Disposition: A | Discharge status: Home / Self Care | Payer: BC Managed Care – PPO | Attending: Internal Medicine | Admitting: Internal Medicine

## 2018-12-27 ENCOUNTER — Observation Stay (HOSPITAL_BASED_OUTPATIENT_CLINIC_OR_DEPARTMENT_OTHER): Payer: BC Managed Care – PPO

## 2018-12-27 ENCOUNTER — Emergency Department (HOSPITAL_COMMUNITY): Payer: BC Managed Care – PPO

## 2018-12-27 ENCOUNTER — Other Ambulatory Visit (HOSPITAL_COMMUNITY): Payer: BC Managed Care – PPO

## 2018-12-27 ENCOUNTER — Encounter (HOSPITAL_COMMUNITY): Payer: Self-pay

## 2018-12-27 ENCOUNTER — Other Ambulatory Visit: Payer: Self-pay

## 2018-12-27 DIAGNOSIS — N2581 Secondary hyperparathyroidism of renal origin: Secondary | ICD-10-CM | POA: Insufficient documentation

## 2018-12-27 DIAGNOSIS — Z7951 Long term (current) use of inhaled steroids: Secondary | ICD-10-CM | POA: Diagnosis not present

## 2018-12-27 DIAGNOSIS — I2510 Atherosclerotic heart disease of native coronary artery without angina pectoris: Secondary | ICD-10-CM | POA: Insufficient documentation

## 2018-12-27 DIAGNOSIS — I340 Nonrheumatic mitral (valve) insufficiency: Secondary | ICD-10-CM | POA: Diagnosis not present

## 2018-12-27 DIAGNOSIS — Z7982 Long term (current) use of aspirin: Secondary | ICD-10-CM | POA: Diagnosis not present

## 2018-12-27 DIAGNOSIS — J9601 Acute respiratory failure with hypoxia: Secondary | ICD-10-CM | POA: Diagnosis not present

## 2018-12-27 DIAGNOSIS — J810 Acute pulmonary edema: Secondary | ICD-10-CM | POA: Diagnosis not present

## 2018-12-27 DIAGNOSIS — N186 End stage renal disease: Secondary | ICD-10-CM | POA: Diagnosis not present

## 2018-12-27 DIAGNOSIS — Z20828 Contact with and (suspected) exposure to other viral communicable diseases: Secondary | ICD-10-CM | POA: Diagnosis not present

## 2018-12-27 DIAGNOSIS — I361 Nonrheumatic tricuspid (valve) insufficiency: Secondary | ICD-10-CM

## 2018-12-27 DIAGNOSIS — Z940 Kidney transplant status: Secondary | ICD-10-CM | POA: Diagnosis not present

## 2018-12-27 DIAGNOSIS — I5033 Acute on chronic diastolic (congestive) heart failure: Secondary | ICD-10-CM | POA: Diagnosis not present

## 2018-12-27 DIAGNOSIS — Z794 Long term (current) use of insulin: Secondary | ICD-10-CM | POA: Insufficient documentation

## 2018-12-27 DIAGNOSIS — R0600 Dyspnea, unspecified: Secondary | ICD-10-CM | POA: Diagnosis not present

## 2018-12-27 DIAGNOSIS — I132 Hypertensive heart and chronic kidney disease with heart failure and with stage 5 chronic kidney disease, or end stage renal disease: Secondary | ICD-10-CM | POA: Diagnosis not present

## 2018-12-27 DIAGNOSIS — Z7952 Long term (current) use of systemic steroids: Secondary | ICD-10-CM | POA: Diagnosis not present

## 2018-12-27 DIAGNOSIS — I272 Pulmonary hypertension, unspecified: Secondary | ICD-10-CM | POA: Insufficient documentation

## 2018-12-27 DIAGNOSIS — R0602 Shortness of breath: Secondary | ICD-10-CM | POA: Diagnosis not present

## 2018-12-27 DIAGNOSIS — Z992 Dependence on renal dialysis: Secondary | ICD-10-CM | POA: Insufficient documentation

## 2018-12-27 DIAGNOSIS — Z79899 Other long term (current) drug therapy: Secondary | ICD-10-CM | POA: Insufficient documentation

## 2018-12-27 DIAGNOSIS — R05 Cough: Secondary | ICD-10-CM | POA: Diagnosis not present

## 2018-12-27 DIAGNOSIS — E1122 Type 2 diabetes mellitus with diabetic chronic kidney disease: Secondary | ICD-10-CM | POA: Diagnosis not present

## 2018-12-27 DIAGNOSIS — E785 Hyperlipidemia, unspecified: Secondary | ICD-10-CM | POA: Insufficient documentation

## 2018-12-27 DIAGNOSIS — I509 Heart failure, unspecified: Secondary | ICD-10-CM | POA: Diagnosis not present

## 2018-12-27 DIAGNOSIS — I5031 Acute diastolic (congestive) heart failure: Secondary | ICD-10-CM

## 2018-12-27 DIAGNOSIS — I120 Hypertensive chronic kidney disease with stage 5 chronic kidney disease or end stage renal disease: Secondary | ICD-10-CM | POA: Diagnosis not present

## 2018-12-27 LAB — CBC WITH DIFFERENTIAL/PLATELET
Abs Immature Granulocytes: 0.13 10*3/uL — ABNORMAL HIGH (ref 0.00–0.07)
Basophils Absolute: 0 10*3/uL (ref 0.0–0.1)
Basophils Relative: 0 %
Eosinophils Absolute: 0.3 10*3/uL (ref 0.0–0.5)
Eosinophils Relative: 2 %
HCT: 32.4 % — ABNORMAL LOW (ref 36.0–46.0)
Hemoglobin: 9.6 g/dL — ABNORMAL LOW (ref 12.0–15.0)
Immature Granulocytes: 1 %
Lymphocytes Relative: 6 %
Lymphs Abs: 0.9 10*3/uL (ref 0.7–4.0)
MCH: 31.1 pg (ref 26.0–34.0)
MCHC: 29.6 g/dL — ABNORMAL LOW (ref 30.0–36.0)
MCV: 104.9 fL — ABNORMAL HIGH (ref 80.0–100.0)
Monocytes Absolute: 0.7 10*3/uL (ref 0.1–1.0)
Monocytes Relative: 5 %
Neutro Abs: 13 10*3/uL — ABNORMAL HIGH (ref 1.7–7.7)
Neutrophils Relative %: 86 %
Platelets: 189 10*3/uL (ref 150–400)
RBC: 3.09 MIL/uL — ABNORMAL LOW (ref 3.87–5.11)
RDW: 14.6 % (ref 11.5–15.5)
WBC: 15 10*3/uL — ABNORMAL HIGH (ref 4.0–10.5)
nRBC: 0 % (ref 0.0–0.2)

## 2018-12-27 LAB — ECHOCARDIOGRAM COMPLETE
Height: 69 in
Weight: 2320 oz

## 2018-12-27 LAB — COMPREHENSIVE METABOLIC PANEL
ALT: 44 U/L (ref 0–44)
AST: 40 U/L (ref 15–41)
Albumin: 3.5 g/dL (ref 3.5–5.0)
Alkaline Phosphatase: 96 U/L (ref 38–126)
Anion gap: 12 (ref 5–15)
BUN: 31 mg/dL — ABNORMAL HIGH (ref 8–23)
CO2: 27 mmol/L (ref 22–32)
Calcium: 8 mg/dL — ABNORMAL LOW (ref 8.9–10.3)
Chloride: 99 mmol/L (ref 98–111)
Creatinine, Ser: 6.46 mg/dL — ABNORMAL HIGH (ref 0.44–1.00)
GFR calc Af Amer: 7 mL/min — ABNORMAL LOW (ref >60)
GFR calc non Af Amer: 6 mL/min — ABNORMAL LOW (ref >60)
Glucose, Bld: 167 mg/dL — ABNORMAL HIGH (ref 70–99)
Potassium: 4.1 mmol/L (ref 3.5–5.1)
Sodium: 138 mmol/L (ref 135–145)
Total Bilirubin: 1.1 mg/dL (ref 0.3–1.2)
Total Protein: 7.3 g/dL (ref 6.5–8.1)

## 2018-12-27 LAB — TROPONIN I (HIGH SENSITIVITY)
Troponin I (High Sensitivity): 61 ng/L — ABNORMAL HIGH (ref <18)
Troponin I (High Sensitivity): 62 ng/L — ABNORMAL HIGH (ref <18)

## 2018-12-27 LAB — PROCALCITONIN: Procalcitonin: 0.39 ng/mL

## 2018-12-27 LAB — LACTIC ACID, PLASMA
Lactic Acid, Venous: 2.5 mmol/L (ref 0.5–1.9)
Lactic Acid, Venous: 2 mmol/L (ref 0.5–1.9)

## 2018-12-27 LAB — GLUCOSE, CAPILLARY
Glucose-Capillary: 114 mg/dL — ABNORMAL HIGH (ref 70–99)
Glucose-Capillary: 124 mg/dL — ABNORMAL HIGH (ref 70–99)

## 2018-12-27 LAB — SARS CORONAVIRUS 2 BY RT PCR (HOSPITAL ORDER, PERFORMED IN ~~LOC~~ HOSPITAL LAB): SARS Coronavirus 2: NEGATIVE

## 2018-12-27 LAB — TSH: TSH: 8.025 u[IU]/mL — ABNORMAL HIGH (ref 0.350–4.500)

## 2018-12-27 LAB — MAGNESIUM: Magnesium: 2.1 mg/dL (ref 1.7–2.4)

## 2018-12-27 LAB — CBG MONITORING, ED: Glucose-Capillary: 102 mg/dL — ABNORMAL HIGH (ref 70–99)

## 2018-12-27 LAB — BRAIN NATRIURETIC PEPTIDE: B Natriuretic Peptide: >4500 pg/mL — ABNORMAL HIGH (ref 0.0–100.0)

## 2018-12-27 MED ORDER — LIDOCAINE-PRILOCAINE 2.5-2.5 % EX CREA: 1.0000 "application " | TOPICAL_CREAM | CUTANEOUS | Status: DC | PRN

## 2018-12-27 MED ORDER — TRIAMCINOLONE ACETONIDE 0.5 % EX OINT: 1.0000 "application " | TOPICAL_OINTMENT | Freq: Two times a day (BID) | CUTANEOUS | Status: DC

## 2018-12-27 MED ORDER — ACETAMINOPHEN 650 MG RE SUPP: 650.0000 mg | Freq: Four times a day (QID) | RECTAL | Status: DC | PRN

## 2018-12-27 MED ORDER — SODIUM CHLORIDE 0.9 % IV SOLN: 100.0000 mL | INTRAVENOUS | Status: DC | PRN

## 2018-12-27 MED ORDER — PIPERACILLIN-TAZOBACTAM 3.375 G IVPB: 3.3750 g | Freq: Two times a day (BID) | INTRAVENOUS | Status: DC

## 2018-12-27 MED ORDER — SODIUM CHLORIDE 0.9% FLUSH: 3.0000 mL | INTRAVENOUS | Status: DC | PRN

## 2018-12-27 MED ORDER — VANCOMYCIN HCL 10 G IV SOLR: 1500.0000 mg | Freq: Once | INTRAVENOUS | Status: DC

## 2018-12-27 MED ORDER — PERFLUTREN LIPID MICROSPHERE
1.0000 mL | INTRAVENOUS | Status: AC | PRN
  Administered 2018-12-27: 16:00:00 3 mL via INTRAVENOUS
  Filled 2018-12-27: qty 10

## 2018-12-27 MED ORDER — INSULIN ASPART 100 UNIT/ML ~~LOC~~ SOLN: 0.0000 [IU] | Freq: Every day | SUBCUTANEOUS | Status: DC

## 2018-12-27 MED ORDER — LIDOCAINE HCL (PF) 1 % IJ SOLN: 5.0000 mL | INTRAMUSCULAR | Status: DC | PRN

## 2018-12-27 MED ORDER — ROSUVASTATIN CALCIUM 20 MG PO TABS
20.0000 mg | ORAL_TABLET | Freq: Every day | ORAL | Status: DC
  Administered 2018-12-28: 09:00:00 20 mg via ORAL
  Filled 2018-12-27: qty 1

## 2018-12-27 MED ORDER — HEPARIN SODIUM (PORCINE) 5000 UNIT/ML IJ SOLN
5000.0000 [IU] | Freq: Three times a day (TID) | INTRAMUSCULAR | Status: DC
  Administered 2018-12-27 – 2018-12-28 (×3): 5000 [IU] via SUBCUTANEOUS
  Filled 2018-12-27 (×3): qty 1

## 2018-12-27 MED ORDER — ONDANSETRON HCL 4 MG/2ML IJ SOLN
4.0000 mg | Freq: Once | INTRAMUSCULAR | Status: AC
  Administered 2018-12-27: 10:00:00 4 mg via INTRAVENOUS
  Filled 2018-12-27: qty 2

## 2018-12-27 MED ORDER — SEVELAMER CARBONATE 800 MG PO TABS: 1600.0000 mg | ORAL_TABLET | Freq: Two times a day (BID) | ORAL | Status: DC

## 2018-12-27 MED ORDER — ONDANSETRON 4 MG PO TBDP: 4.0000 mg | ORAL_TABLET | Freq: Three times a day (TID) | ORAL | Status: DC | PRN

## 2018-12-27 MED ORDER — TACROLIMUS 0.5 MG PO CAPS
0.5000 mg | ORAL_CAPSULE | Freq: Every day | ORAL | Status: DC
  Administered 2018-12-28: 0.5 mg via ORAL
  Filled 2018-12-27 (×3): qty 1

## 2018-12-27 MED ORDER — ACETAMINOPHEN 325 MG PO TABS: 650.0000 mg | ORAL_TABLET | Freq: Four times a day (QID) | ORAL | Status: DC | PRN

## 2018-12-27 MED ORDER — INSULIN GLARGINE 100 UNIT/ML ~~LOC~~ SOLN
7.0000 [IU] | Freq: Every day | SUBCUTANEOUS | Status: DC
  Administered 2018-12-27: 22:00:00 7 [IU] via SUBCUTANEOUS
  Filled 2018-12-27 (×3): qty 0.07

## 2018-12-27 MED ORDER — CHLORHEXIDINE GLUCONATE CLOTH 2 % EX PADS
6.0000 | MEDICATED_PAD | Freq: Every day | CUTANEOUS | Status: DC
  Administered 2018-12-27: 16:00:00 6 via TOPICAL

## 2018-12-27 MED ORDER — FLUTICASONE PROPIONATE 50 MCG/ACT NA SUSP: 2.0000 | Freq: Every day | NASAL | Status: DC

## 2018-12-27 MED ORDER — ASPIRIN EC 81 MG PO TBEC
81.0000 mg | DELAYED_RELEASE_TABLET | Freq: Every day | ORAL | Status: DC
  Administered 2018-12-27 – 2018-12-28 (×2): 81 mg via ORAL
  Filled 2018-12-27 (×2): qty 1

## 2018-12-27 MED ORDER — SEVELAMER CARBONATE 800 MG PO TABS
3200.0000 mg | ORAL_TABLET | Freq: Three times a day (TID) | ORAL | Status: DC
  Administered 2018-12-27 – 2018-12-28 (×3): 3200 mg via ORAL
  Filled 2018-12-27 (×3): qty 4

## 2018-12-27 MED ORDER — SODIUM CHLORIDE 0.9 % IV SOLN: 250.0000 mL | INTRAVENOUS | Status: DC | PRN

## 2018-12-27 MED ORDER — AMLODIPINE BESYLATE 5 MG PO TABS
5.0000 mg | ORAL_TABLET | Freq: Every day | ORAL | Status: DC
  Administered 2018-12-27: 22:00:00 5 mg via ORAL
  Filled 2018-12-27: qty 1

## 2018-12-27 MED ORDER — PENTAFLUOROPROP-TETRAFLUOROETH EX AERO: 1.0000 "application " | INHALATION_SPRAY | CUTANEOUS | Status: DC | PRN

## 2018-12-27 MED ORDER — SODIUM CHLORIDE 0.9% FLUSH
3.0000 mL | Freq: Two times a day (BID) | INTRAVENOUS | Status: DC
  Administered 2018-12-27 – 2018-12-28 (×3): 3 mL via INTRAVENOUS

## 2018-12-27 MED ORDER — ALBUTEROL SULFATE (2.5 MG/3ML) 0.083% IN NEBU: 3.0000 mL | INHALATION_SOLUTION | Freq: Four times a day (QID) | RESPIRATORY_TRACT | Status: DC | PRN

## 2018-12-27 MED ORDER — INSULIN ASPART 100 UNIT/ML ~~LOC~~ SOLN
0.0000 [IU] | Freq: Three times a day (TID) | SUBCUTANEOUS | Status: DC
  Administered 2018-12-28: 3 [IU] via SUBCUTANEOUS

## 2018-12-27 MED ORDER — PREDNISONE 10 MG PO TABS
5.0000 mg | ORAL_TABLET | Freq: Every day | ORAL | Status: DC
  Administered 2018-12-27 – 2018-12-28 (×2): 5 mg via ORAL
  Filled 2018-12-27 (×2): qty 1

## 2018-12-27 MED ORDER — NITROGLYCERIN IN D5W 200-5 MCG/ML-% IV SOLN
20.0000 ug/min | INTRAVENOUS | Status: DC
  Administered 2018-12-27: 10:00:00 20 ug/min via INTRAVENOUS
  Filled 2018-12-27: qty 250

## 2018-12-27 MED ORDER — MECLIZINE HCL 12.5 MG PO TABS: 25.0000 mg | ORAL_TABLET | Freq: Two times a day (BID) | ORAL | Status: DC | PRN

## 2018-12-27 MED ORDER — PIPERACILLIN-TAZOBACTAM 3.375 G IVPB 30 MIN
3.3750 g | Freq: Once | INTRAVENOUS | Status: AC
  Administered 2018-12-27: 3.375 g via INTRAVENOUS
  Filled 2018-12-27: qty 50

## 2018-12-27 MED ORDER — ACETAMINOPHEN 500 MG PO TABS: 500.0000 mg | ORAL_TABLET | Freq: Four times a day (QID) | ORAL | Status: DC | PRN

## 2018-12-27 MED ORDER — ISOSORBIDE MONONITRATE ER 60 MG PO TB24: 60.0000 mg | ORAL_TABLET | Freq: Every day | ORAL | Status: DC

## 2018-12-27 MED ORDER — METOPROLOL TARTRATE 25 MG PO TABS
12.5000 mg | ORAL_TABLET | Freq: Two times a day (BID) | ORAL | Status: DC
  Administered 2018-12-27 – 2018-12-28 (×3): 12.5 mg via ORAL
  Filled 2018-12-27 (×3): qty 1

## 2018-12-27 MED ORDER — RENA-VITE PO TABS
1.0000 | ORAL_TABLET | Freq: Every day | ORAL | Status: DC
  Administered 2018-12-28: 09:00:00 1 via ORAL
  Filled 2018-12-27: qty 1

## 2018-12-27 NOTE — ED Notes (Signed)
ED TO INPATIENT HANDOFF REPORT  ED Nurse Name and Phone #:  Azzie Almas, RN, [phone]  S Name/Age/Gender [name] 63 y.o. female Room/Bed: APA04/APA04  Code Status   Code Status: Full Code  Home/SNF/Other Home Patient oriented to: self, place, time and situation Is this baseline? Yes   Triage Complete: Triage complete  Chief Complaint SOB,COUGH  Triage Note Pt reports cough, sob, and chest pain.  Pt was admitted with pneumonia recently and discharged last Thursday.  Reports was tested for covid twice and both were negative.     Allergies Allergies  Allergen Reactions  . Other Other (See Comments)    Tape Bruises and tears skin, Paper tape tolerated  . Tape Other (See Comments)    Bruises and tears skin Paper tape tolerated Bruises and tears skin Paper tape tolerated Bruises and tears skin Paper tape tolerated    Level of Care/Admitting Diagnosis ED Disposition    ED Disposition Condition [place]: [facility] [id number]  Level of Care: Stepdown [14]  Covid Evaluation: N/A  Diagnosis: Acute hypoxemic respiratory failure [facility]) [id number]  Admitting Physician: Rodena Goldmann [id number]  Attending Physician: Heath Lark D [id number]  PT Class (Do Not Modify): Observation [104]  PT Acc Code (Do Not Modify): Observation [10022]       B Medical/Surgery History Past Medical History:  Diagnosis Date  . Anemia    of chronic disease  . Blood transfusion without reported diagnosis   . Cardiovascular disease    nonobstructive  . Carotid artery stenosis 2008  . CHF (congestive heart failure) ([place])   . Coronary artery disease   . Diabetes mellitus   . Dyslipidemia   . Edema of lower extremity    with hypo-albuminemia and profound protenuria  . Heart murmur   . Hypertension   . Mitral regurgitation   . Nephrotic syndrome   . Patent foramen ovale   . Pulmonary hypertension, moderate to severe ([place])   . Pulmonary nodule   .  Tricuspid regurgitation   . Volume depletion, renal, due to output loss (renal deficit)    Past Surgical History:  Procedure Laterality Date  . A/V FISTULAGRAM N/A 04/05/2017   Procedure: A/V FISTULAGRAM - Left Arm;  Surgeon: Angelia Mould, MD;  Location: [place] [facility];  Service: Cardiovascular;  Laterality: N/A;  . A/V FISTULAGRAM Left 09/18/2017   Procedure: A/V FISTULAGRAM;  Surgeon: Serafina Mitchell, MD;  Location: [place] [facility];  Service: Cardiovascular;  Laterality: Left;  . A/V SHUNT INTERVENTION Left 04/05/2017   Procedure: A/V SHUNT INTERVENTION;  Surgeon: Angelia Mould, MD;  Location: [place] [facility];  Service: Cardiovascular;  Laterality: Left;  . CARDIAC CATHETERIZATION  2008  . IR REMOVAL TUN CV CATH W/O FL  11/02/2016  . 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 PERIPHERAL VASCULAR BALLOON ANGIOPLASTY Left 09/18/2017   Procedure: PERIPHERAL VASCULAR BALLOON ANGIOPLASTY;  Surgeon: Serafina Mitchell, MD;  Location: [place] [facility];  Service: Cardiovascular;  Laterality: Left;  Arm Fistula     A IV Location/Drains/Wounds Patient Lines/Drains/Airways Status   Active Line/Drains/Airways    Name:   Placement date:   Placement time:   Site:   Days:   Peripheral IV 12/27/18 Right;Upper Arm   12/27/18    0930    Arm   less than 1   Fistula / Graft Left Forearm   -    -    Forearm  Fistula / Graft Left Forearm Arteriovenous fistula   -    -    Forearm      Sheath 04/05/17 Left   04/05/17    0740    -   631   External Urinary Catheter   12/27/18    1025    -   less than 1          Intake/Output Last 24 hours  Intake/Output Summary (Last 24 hours) at 12/27/2018 1444 Last data filed at 12/27/2018 1021 Gross per 24 hour  Intake 50 ml  Output -  Net 50 ml    Labs/Imaging Results for orders placed or performed during the hospital encounter of 12/27/18 (from the past 48 hour(s))  SARS Coronavirus 2 by RT PCR (hospital order, performed in [facility] hospital lab) Nasopharyngeal Nasopharyngeal Swab     Status: None   Collection Time: 12/27/18  8:51 AM   Specimen: Nasopharyngeal Swab  Result Value Ref Range   SARS Coronavirus 2 NEGATIVE NEGATIVE    Comment: (NOTE) If result is NEGATIVE SARS-CoV-2 target nucleic acids are NOT DETECTED. The SARS-CoV-2 RNA is generally detectable in upper and lower  respiratory specimens during the acute phase of infection. The lowest  concentration of SARS-CoV-2 viral copies this assay can detect is 250  copies / mL. A negative result does not preclude SARS-CoV-2 infection  and should not be used as the sole basis for treatment or other  patient management decisions.  A negative result may occur with  improper specimen collection / handling, submission of specimen other  than nasopharyngeal swab, presence of viral mutation(s) within the  areas targeted by this assay, and inadequate number of viral copies  (<250 copies / mL). A negative result must be combined with clinical  observations, patient history, and epidemiological information. If result is POSITIVE SARS-CoV-2 target nucleic acids are DETECTED. The SARS-CoV-2 RNA is generally detectable in upper and lower  respiratory specimens dur ing the acute phase of infection.  Positive  results are indicative of active infection with SARS-CoV-2.  Clinical  correlation with patient history and other diagnostic information is  necessary to determine patient infection status.  Positive results do  not rule out bacterial infection or co-infection with other viruses. If result is PRESUMPTIVE POSTIVE SARS-CoV-2 nucleic acids MAY BE PRESENT.   A presumptive positive result was obtained on the submitted specimen  and confirmed on repeat testing.  While 2019 novel coronavirus  (SARS-CoV-2) nucleic acids may be present in the submitted sample  additional confirmatory testing may be necessary for epidemiological  and / or clinical management purposes  to  differentiate between  SARS-CoV-2 and other Sarbecovirus currently known to infect humans.  If clinically indicated additional testing with an alternate test  methodology [phone]) is advised. The SARS-CoV-2 RNA is generally  detectable in upper and lower respiratory sp ecimens during the acute  phase of infection. The expected result is Negative. Fact Sheet for Patients:  StrictlyIdeas.no Fact Sheet for Healthcare Providers: BankingDealers.co.za This test is not yet approved or cleared by the [place] FDA and has been authorized for detection and/or diagnosis of SARS-CoV-2 by FDA under an Emergency Use Authorization (EUA).  This EUA will remain in effect (meaning this test can be used) for the duration of the COVID-19 declaration under Section 564(b)(1) of the Act, 21 U.S.C. section 360bbb-3(b)(1), unless the authorization is terminated or revoked sooner. Performed at [facility], [address] [place], [place] [address]   CBC  with Differential     Status: Abnormal   Collection Time: 12/27/18  9:08 AM  Result Value Ref Range   WBC 15.0 (H) 4.0 - 10.5 K/uL   RBC 3.09 (L) 3.87 - 5.11 MIL/uL   Hemoglobin 9.6 (L) 12.0 - 15.0 g/dL   HCT 32.4 (L) 36.0 - 46.0 %   MCV 104.9 (H) 80.0 - 100.0 fL   MCH 31.1 26.0 - 34.0 pg   MCHC 29.6 (L) 30.0 - 36.0 g/dL   RDW 14.6 11.5 - 15.5 %   Platelets 189 150 - 400 K/uL   nRBC 0.0 0.0 - 0.2 %   Neutrophils Relative % 86 %   Neutro Abs 13.0 (H) 1.7 - 7.7 K/uL   Lymphocytes Relative 6 %   Lymphs Abs 0.9 0.7 - 4.0 K/uL   Monocytes Relative 5 %   Monocytes Absolute 0.7 0.1 - 1.0 K/uL   Eosinophils Relative 2 %   Eosinophils Absolute 0.3 0.0 - 0.5 K/uL   Basophils Relative 0 %   Basophils Absolute 0.0 0.0 - 0.1 K/uL   Immature Granulocytes 1 %   Abs Immature Granulocytes 0.13 (H) 0.00 - 0.07 K/uL    Comment: Performed at [facility], [address], [place], [place] [address]  Comprehensive  metabolic panel     Status: Abnormal   Collection Time: 12/27/18  9:08 AM  Result Value Ref Range   Sodium 138 135 - 145 mmol/L   Potassium 4.1 3.5 - 5.1 mmol/L   Chloride 99 98 - 111 mmol/L   CO2 27 22 - 32 mmol/L   Glucose, Bld 167 (H) 70 - 99 mg/dL   BUN 31 (H) 8 - 23 mg/dL   Creatinine, Ser 6.46 (H) 0.44 - 1.00 mg/dL   Calcium 8.0 (L) 8.9 - 10.3 mg/dL   Total Protein 7.3 6.5 - 8.1 g/dL   Albumin 3.5 3.5 - 5.0 g/dL   AST 40 15 - 41 U/L   ALT 44 0 - 44 U/L   Alkaline Phosphatase 96 38 - 126 U/L   Total Bilirubin 1.1 0.3 - 1.2 mg/dL   GFR calc non Af Amer 6 (L) >60 mL/min   GFR calc Af Amer 7 (L) >60 mL/min   Anion gap 12 5 - 15    Comment: Performed at [facility], [address] [place], [place] [address]  Magnesium     Status: None   Collection Time: 12/27/18  9:08 AM  Result Value Ref Range   Magnesium 2.1 1.7 - 2.4 mg/dL    Comment: Performed at [facility], [address], [place], [place] [address]  Brain natriuretic peptide     Status: Abnormal   Collection Time: 12/27/18  9:10 AM  Result Value Ref Range   B Natriuretic Peptide >4,500.0 (H) 0.0 - 100.0 pg/mL    Comment: Performed at [facility], [address], [place], [place] [address]  Blood culture (routine x 2)     Status: None (Preliminary result)   Collection Time: 12/27/18  9:11 AM   Specimen: BLOOD RIGHT ARM  Result Value Ref Range   Specimen Description      BLOOD RIGHT ARM BOTTLES DRAWN AEROBIC AND ANAEROBIC   Special Requests      Blood Culture adequate volume Performed at [facility], [address], [place], [place] [address]    Culture PENDING    Report Status PENDING   Lactic acid, plasma     Status: Abnormal   Collection Time: 12/27/18  9:11 AM  Result Value Ref Range   Lactic Acid, Venous 2.5 (HH) 0.5 - 1.9 mmol/L    Comment: CRITICAL RESULT CALLED TO, READ BACK BY AND VERIFIED WITH: Charda Janis,Y AT 9:50AM ON 12/27/18 BY [facility] Performed at [facility], [address], [place], [place]  [address]   Blood culture (routine x 2)     Status: None (Preliminary result)   Collection Time: 12/27/18  9:18 AM   Specimen: BLOOD LEFT ARM  Result Value Ref Range   Specimen Description BLOOD LEFT ARM BOTTLES DRAWN AEROBIC AND ANAEROBIC    Special Requests      Blood Culture adequate volume Performed at [facility], [address], [place], [place] [address]    Culture PENDING    Report Status PENDING   Troponin I (High Sensitivity)     Status: Abnormal   Collection Time: 12/27/18  9:26 AM  Result Value Ref Range   Troponin I (High Sensitivity) 61 (H) <18 ng/L    Comment: (NOTE) Elevated high sensitivity troponin I (hsTnI) values and significant  changes across serial measurements may suggest ACS but many other  chronic and acute conditions are known to elevate hsTnI results.  Refer to the "Links" section for chest pain algorithms and additional  guidance. Performed at [facility], [address], [place], [place] [address]   Lactic acid, plasma     Status: Abnormal   Collection Time: 12/27/18 10:48 AM  Result Value Ref Range   Lactic Acid, Venous 2.0 (HH) 0.5 - 1.9 mmol/L    Comment: CRITICAL RESULT CALLED TO, READ BACK BY AND VERIFIED WITH: MOORE,M AT 11:50AM ON 12/27/18 BY [facility] Performed at [facility], [address] [place], [place] [address]   Procalcitonin - Baseline     Status: None   Collection Time: 12/27/18 10:48 AM  Result Value Ref Range   Procalcitonin 0.39 ng/mL    Comment:        Interpretation: PCT (Procalcitonin) <= 0.5 ng/mL: Systemic infection (sepsis) is not likely. Local bacterial infection is possible. (NOTE)       Sepsis PCT Algorithm           Lower Respiratory Tract                                      Infection PCT Algorithm    ----------------------------     ----------------------------         PCT < 0.25 ng/mL                PCT < 0.10 ng/mL         Strongly encourage             Strongly discourage   discontinuation of antibiotics     initiation of antibiotics    ----------------------------     -----------------------------       PCT 0.25 - 0.50 ng/mL            PCT 0.10 - 0.25 ng/mL               OR       >80% decrease in PCT            Discourage initiation of  antibiotics      Encourage discontinuation           of antibiotics    ----------------------------     -----------------------------         PCT >= 0.50 ng/mL              PCT 0.26 - 0.50 ng/mL               AND        <80% decrease in PCT             Encourage initiation of                                             antibiotics       Encourage continuation           of antibiotics    ----------------------------     -----------------------------        PCT >= 0.50 ng/mL                  PCT > 0.50 ng/mL               AND         increase in PCT                  Strongly encourage                                      initiation of antibiotics    Strongly encourage escalation           of antibiotics                                     -----------------------------                                           PCT <= 0.25 ng/mL                                                 OR                                        > 80% decrease in PCT                                     Discontinue / Do not initiate                                             antibiotics Performed at [facility], [address] [place], [place] [address]   TSH     Status: Abnormal   Collection Time: 12/27/18 10:48 AM  Result  Value Ref Range   TSH 8.025 (H) 0.350 - 4.500 uIU/mL    Comment: Performed by a 3rd Generation assay with a functional sensitivity of <=0.01 uIU/mL. Performed at [facility], [address] [place], [place] [address]   Troponin I (High Sensitivity)     Status: Abnormal   Collection Time: 12/27/18 11:06 AM  Result Value Ref Range   Troponin I (High Sensitivity) 62 (H) <18 ng/L    Comment: (NOTE) Elevated high  sensitivity troponin I (hsTnI) values and significant  changes across serial measurements may suggest ACS but many other  chronic and acute conditions are known to elevate hsTnI results.  Refer to the "Links" section for chest pain algorithms and additional  guidance. Performed at [facility], [address] [place], [place] [address]   CBG monitoring, ED     Status: Abnormal   Collection Time: 12/27/18  1:27 PM  Result Value Ref Range   Glucose-Capillary 102 (H) 70 - 99 mg/dL   Dg Chest Portable 1 View  Result Date: 12/27/2018 CLINICAL DATA:  Shortness of breath and cough EXAM: PORTABLE CHEST 1 VIEW COMPARISON:  12/15/2018 FINDINGS: Cardiac shadow is mildly enlarged but stable. Aortic calcifications are again seen. Central vascular congestion is noted increased from the prior exam with interstitial edema. No focal confluent infiltrate is seen. No bony abnormality is noted. IMPRESSION: Changes consistent with CHF. Electronically Signed   By: Inez Catalina M.D.   On: 12/27/2018 07:50    Pending Labs Unresulted Labs (From admission, onward)    Start     Ordered   12/28/18 0500  Lactic acid, plasma  Tomorrow morning,   R     12/27/18 1056   12/28/18 [id number]  Basic metabolic panel  Tomorrow morning,   R     12/27/18 1118   12/28/18 0500  CBC  Tomorrow morning,   R     12/27/18 1118   Signed and Held  Renal function panel  Once,   R     Signed and Held   Signed and Held  CBC  Once,   R     Signed and Held          Vitals/Pain Today's Vitals   12/27/18 1345 12/27/18 1400 12/27/18 1415 12/27/18 1421  BP: (!) 113/38 (!) 103/42 (!) 111/49   Pulse: 65 64 66   Resp: 14 14 15    Temp:      TempSrc:      SpO2: 100% 99% 100%   Weight:      Height:      PainSc:    0-No pain    Isolation Precautions No active isolations  Medications Medications  nitroGLYCERIN 50 mg in dextrose 5 % 250 mL (0.2 mg/mL) infusion (20 mcg/min Intravenous New Bag/Given 12/27/18 1027)  Chlorhexidine  Gluconate Cloth 2 % PADS 6 each (has no administration in time range)  sevelamer carbonate (RENVELA) tablet 800 mg (has no administration in time range)  ondansetron (ZOFRAN-ODT) disintegrating tablet 4 mg (has no administration in time range)  fluticasone (FLONASE) 50 MCG/ACT nasal spray 2 spray (has no administration in time range)  albuterol (PROVENTIL) (2.5 MG/3ML) 0.083% nebulizer solution 3 mL (has no administration in time range)  triamcinolone ointment (KENALOG) 0.5 % 1 application (has no administration in time range)  meclizine (ANTIVERT) tablet 25 mg (has no administration in time range)  acetaminophen (TYLENOL) tablet 500 mg (has no administration in time range)  aspirin EC tablet 81 mg (81 mg Oral  Given 12/27/18 1424)  amLODipine (NORVASC) tablet 5 mg (has no administration in time range)  metoprolol tartrate (LOPRESSOR) tablet 12.5 mg (12.5 mg Oral Given 12/27/18 1424)  rosuvastatin (CRESTOR) tablet 20 mg (has no administration in time range)  insulin degludec (TRESIBA) 100 UNIT/ML FlexTouch Pen 10 Units (has no administration in time range)  predniSONE (DELTASONE) tablet 5 mg (5 mg Oral Given 12/27/18 1423)  tacrolimus (PROGRAF) capsule 0.5 mg (has no administration in time range)  Rena-Vite Rx TABS 1 tablet (has no administration in time range)  lidocaine-prilocaine (EMLA) cream 1 application (has no administration in time range)  heparin injection 5,000 Units (5,000 Units Subcutaneous Given 12/27/18 1418)  sodium chloride flush (NS) 0.9 % injection 3 mL (has no administration in time range)  sodium chloride flush (NS) 0.9 % injection 3 mL (has no administration in time range)  0.9 %  sodium chloride infusion (has no administration in time range)  acetaminophen (TYLENOL) tablet 650 mg (has no administration in time range)    Or  acetaminophen (TYLENOL) suppository 650 mg (has no administration in time range)  insulin aspart (novoLOG) injection 0-9 Units (0 Units Subcutaneous  Not Given 12/27/18 1409)  insulin aspart (novoLOG) injection 0-5 Units (has no administration in time range)  ondansetron (ZOFRAN) injection 4 mg (4 mg Intravenous Given 12/27/18 0931)  piperacillin-tazobactam (ZOSYN) IVPB 3.375 g (0 g Intravenous Stopped 12/27/18 1021)    Mobility Ambulatory Low fall risk   Focused Assessments    R Recommendations: See Admitting Provider Note  Report given to: Caryl Pina, RN ICU  Additional Notes:

## 2018-12-27 NOTE — H&P (Addendum)
History and Physical    [name] [id number] DOB: 1955/10/19 DOA: 12/27/2018  PCP: Sharion Balloon, FNP   Patient coming from: Home  Chief Complaint: Dyspnea  HPI: [name] is a 63 y.o. female with medical history significant for ESRD on HD MWF, grade 1 diastolic CHF and pulmonary hypertension, CAD, hypertension, diabetes, and anemia of chronic disease who presented to the ED after suddenly awakening this morning at 3 AM with shortness of breath.  She had a mild cough that was nonproductive with no fevers or chills noted.  She has been attending her hemodialysis sessions regularly with last dialysis treatment on 12/25/2018.  She was recently discharged on 10/15 after treatment for aspiration pneumonia with mucous plugging and lung collapse.  She had completed her antibiotic treatment at that time.   ED Course: Vital signs stable patient afebrile.  Chest x-ray demonstrating CHF with BNP greater than 4500.  BUN is 31 and creatinine 6.46.  Hemoglobin is stable at 9.6 and platelets are 189,000.  She is currently on 5-6 L nasal cannula and does not wear home oxygen.  Lactic acid is 2.5 and troponin is 61 with stable leukocytosis of 15,000 similar to prior.  EDP has spoken with nephrology who plans for hemodialysis today.  She does not appear to be in any respiratory distress.  She is given some vancomycin and Zosyn empirically.  Review of Systems: All others reviewed and otherwise negative except as noted above.  Past Medical History:  Diagnosis Date  . Anemia    of chronic disease  . Blood transfusion without reported diagnosis   . Cardiovascular disease    nonobstructive  . Carotid artery stenosis 2008  . CHF (congestive heart failure) ([place])   . Coronary artery disease   . Diabetes mellitus   . Dyslipidemia   . Edema of lower extremity    with hypo-albuminemia and profound protenuria  . Heart murmur   . Hypertension   . Mitral regurgitation   . Nephrotic syndrome   .  Patent foramen ovale   . Pulmonary hypertension, moderate to severe ([place])   . Pulmonary nodule   . Tricuspid regurgitation   . Volume depletion, renal, due to output loss (renal deficit)     Past Surgical History:  Procedure Laterality Date  . A/V FISTULAGRAM N/A 04/05/2017   Procedure: A/V FISTULAGRAM - Left Arm;  Surgeon: Angelia Mould, MD;  Location: [facility];  Service: Cardiovascular;  Laterality: N/A;  . A/V FISTULAGRAM Left 09/18/2017   Procedure: A/V FISTULAGRAM;  Surgeon: Serafina Mitchell, MD;  Location: [facility];  Service: Cardiovascular;  Laterality: Left;  . A/V SHUNT INTERVENTION Left 04/05/2017   Procedure: A/V SHUNT INTERVENTION;  Surgeon: Angelia Mould, MD;  Location: [facility];  Service: Cardiovascular;  Laterality: Left;  . CARDIAC CATHETERIZATION  2008  . IR REMOVAL TUN CV CATH W/O FL  11/02/2016  . 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 PERIPHERAL VASCULAR BALLOON ANGIOPLASTY Left 09/18/2017   Procedure: PERIPHERAL VASCULAR BALLOON ANGIOPLASTY;  Surgeon: Serafina Mitchell, MD;  Location: [facility];  Service: Cardiovascular;  Laterality: Left;  Arm Fist[place]     reports that she has never smoked. She has never used smokeless tobacco. She reports that she does not drink alcohol or use drugs.  Allergies  Allergen Reactions  . Other Other (See Comments)    Tape Bruises and tears skin, Paper tape tolerated  . Tape Other (See Comments)  Bruises and tears skin Paper tape tolerated Bruises and tears skin Paper tape tolerated Bruises and tears skin Paper tape tolerated    Family History  Problem Relation Age of Onset  . Kidney disease Mother   . Diabetes Mother   . Diabetes Father   . Heart disease Father     Prior to Admission medications   Medication Sig Start Date End Date Taking? Authorizing Provider  acetaminophen (TYLENOL) 500 MG tablet Take 500 mg by mouth every 6 (six) hours as needed for moderate pain  or headache.  04/06/16  Yes [provider]  amLODipine (NORVASC) 5 MG tablet Take 1 tablet by mouth nightly Patient taking differently: Take 5 mg by mouth at bedtime.  11/25/18  Yes Hawks, Christy A, FNP  aspirin (ASPIR-LOW) 81 MG EC tablet Take 81 mg by mouth daily.    Yes [provider]  B Complex-C-Folic Acid (RENA-VITE RX) 1 MG TABS Take 1 tablet by mouth daily. 09/24/18  Yes [provider]  insulin degludec (TRESIBA FLEXTOUCH) 100 UNIT/ML SOPN FlexTouch Pen Inject 0.1 mLs (10 Units total) into the skin daily at 10 pm. 11/05/18  Yes Hawks, Christy A, FNP  isosorbide mononitrate (IMDUR) 60 MG 24 hr tablet Take 1 tablet by mouth once daily Patient taking differently: Take 60 mg by mouth daily.  11/18/18  Yes Hawks, Christy A, FNP  lidocaine-prilocaine (EMLA) cream Apply 1 application topically as needed (for dialysis).  09/08/16  Yes [provider]  predniSONE (DELTASONE) 5 MG tablet TAKE 1 TABLET BY MOUTH ONCE DAILY WITH BREAKFAST Patient taking differently: Take 5 mg by mouth daily.  04/22/18  Yes Hawks, Christy A, FNP  PROGRAF 0.5 MG capsule Take 0.5 mg by mouth daily. Take 0.5mg  by mouth each morning. 10/24/16  Yes [provider]  rosuvastatin (CRESTOR) 20 MG tablet Take 1 tablet by mouth once daily Patient taking differently: Take 20 mg by mouth daily.  10/28/18  Yes Hawks, Christy A, FNP  TRULICITY 1.5 0000000 SOPN INJECT 1 DOSE (1.5 MG) SUBCUTANEOUSLY ONCE A WEEK Patient taking differently: Inject 1.5 mg into the skin once a week.  10/02/18  Yes Hawks, Christy A, FNP  albuterol (PROVENTIL HFA;VENTOLIN HFA) 108 (90 Base) MCG/ACT inhaler Inhale 1-2 puffs into the lungs every 6 (six) hours as needed for wheezing or shortness of breath. Patient not taking: Reported on 12/03/2018 05/05/18   Triplett, Tammy, PA-C  fluticasone (FLONASE) 50 MCG/ACT nasal spray Place 2 sprays into both nostrils daily. Patient not taking: Reported on 12/03/2018 05/03/18   Sharion Balloon, FNP  meclizine (ANTIVERT) 25 MG tablet Take 1 tablet (25 mg total) by mouth 2 (two) times daily as needed for dizziness. Patient not taking: Reported on 12/27/2018 11/26/18   Evalee Jefferson, PA-C  metoprolol tartrate (LOPRESSOR) 25 MG tablet Take 1/2 (one-half) tablet by mouth twice daily Patient not taking: No sig reported 10/02/18   Evelina Dun A, FNP  ondansetron (ZOFRAN ODT) 4 MG disintegrating tablet 4mg  ODT q4 hours prn nausea/vomit Patient not taking: Reported on 12/27/2018 11/26/17   Milton Ferguson, MD  sevelamer carbonate (RENVELA) 800 MG tablet Take 800 mg by mouth See admin instructions. 4 tablets with meals(3 times daily) and 2 tablets with snacks(twice daily). Total of 16 tablets daily. 10/02/16   [provider]  triamcinolone ointment (KENALOG) 0.5 % Apply 1 application topically 2 (two) times daily. Patient not taking: Reported on 12/27/2018 10/17/18   Sharion Balloon, FNP    Physical Exam:  Vitals:   12/27/18 0927 12/27/18 0930 12/27/18 1000 12/27/18 1030  BP: (!) 142/99 (!) 156/67 (!) 117/46 (!) 145/49  Pulse: 80 78 84 83  Resp: (!) 26 (!) 22 18 (!) 22  Temp:      TempSrc:      SpO2: 94% 95% 93% 92%  Weight:      Height:        Constitutional: NAD, calm, comfortable Vitals:   12/27/18 0927 12/27/18 0930 12/27/18 1000 12/27/18 1030  BP: (!) 142/99 (!) 156/67 (!) 117/46 (!) 145/49  Pulse: 80 78 84 83  Resp: (!) 26 (!) 22 18 (!) 22  Temp:      TempSrc:      SpO2: 94% 95% 93% 92%  Weight:      Height:       Eyes: lids and conjunctivae normal ENMT: Mucous membranes are moist.  Neck: normal, supple Respiratory: clear to auscultation bilaterally. Normal respiratory effort. No accessory muscle use.  Cardiovascular: Regular rate and rhythm, no murmurs. No extremity edema. Abdomen: no tenderness, no distention. Bowel sounds positive.  Musculoskeletal:  No joint deformity upper and lower extremities.   Skin: no rashes, lesions, ulcers.  Psychiatric:  Normal judgment and insight. Alert and oriented x 3. Normal mood.   Labs on Admission: I have personally reviewed following labs and imaging studies  CBC: Recent Labs  Lab 12/27/18 0908  WBC 15.0*  NEUTROABS 13.0*  HGB 9.6*  HCT 32.4*  MCV 104.9*  PLT 99991111   Basic Metabolic Panel: Recent Labs  Lab 12/27/18 0908  NA 138  K 4.1  CL 99  CO2 27  GLUCOSE 167*  BUN 31*  CREATININE 6.46*  CALCIUM 8.0*  MG 2.1   GFR: Estimated Creatinine Clearance: 9.1 mL/min (A) (by C-G formula based on SCr of 6.46 mg/dL (H)). Liver Function Tests: Recent Labs  Lab 12/27/18 0908  AST 40  ALT 44  ALKPHOS 96  BILITOT 1.1  PROT 7.3  ALBUMIN 3.5   No results for input(s): LIPASE, AMYLASE in the last 168 hours. No results for input(s): AMMONIA in the last 168 hours. Coagulation Profile: No results for input(s): INR, PROTIME in the last 168 hours. Cardiac Enzymes: No results for input(s): CKTOTAL, CKMB, CKMBINDEX, TROPONINI in the last 168 hours. BNP (last 3 results) No results for input(s): PROBNP in the last 8760 hours. HbA1C: No results for input(s): HGBA1C in the last 72 hours. CBG: No results for input(s): GLUCAP in the last 168 hours. Lipid Profile: No results for input(s): CHOL, HDL, LDLCALC, TRIG, CHOLHDL, LDLDIRECT in the last 72 hours. Thyroid Function Tests: No results for input(s): TSH, T4TOTAL, FREET4, T3FREE, THYROIDAB in the last 72 hours. Anemia Panel: No results for input(s): VITAMINB12, FOLATE, FERRITIN, TIBC, IRON, RETICCTPCT in the last 72 hours. Urine analysis:    Component Value Date/Time   COLORURINE YELLOW 12/02/2018 0622   APPEARANCEUR HAZY (A) 12/02/2018 0622   APPEARANCEUR Cloudy (A) 11/25/2018 1202   LABSPEC 1.013 12/02/2018 0622   PHURINE 7.0 12/02/2018 0622   GLUCOSEU 150 (A) 12/02/2018 0622   HGBUR SMALL (A) 12/02/2018 0622   BILIRUBINUR NEGATIVE 12/02/2018 0622   BILIRUBINUR Negative 11/25/2018 1202   KETONESUR NEGATIVE 12/02/2018 0622    PROTEINUR 100 (A) 12/02/2018 0622   NITRITE NEGATIVE 12/02/2018 0622   LEUKOCYTESUR LARGE (A) 12/02/2018 0622    Radiological Exams on Admission: Dg Chest Portable 1 View  Result Date: 12/27/2018 CLINICAL DATA:  Shortness of breath and cough EXAM: PORTABLE CHEST 1  VIEW COMPARISON:  12/15/2018 FINDINGS: Cardiac shadow is mildly enlarged but stable. Aortic calcifications are again seen. Central vascular congestion is noted increased from the prior exam with interstitial edema. No focal confluent infiltrate is seen. No bony abnormality is noted. IMPRESSION: Changes consistent with CHF. Electronically Signed   By: Inez Catalina M.D.   On: 12/27/2018 07:50    EKG: Independently reviewed. SR 78bpm with LAFB.  Assessment/Plan Active Problems:   Acute hypoxemic respiratory failure (HCC)   Acute diastolic CHF (congestive heart failure) (HCC)    Acute hypoxemic respiratory failure likely secondary to acute on chronic diastolic CHF exacerbation -Appreciate nephrology for hemodialysis today -Continue to monitor volume status and wean oxygen as tolerated to room air -Obtain 2D echocardiogram -EKG with no new changes and troponins at prior baseline -No signs of ACS or associated symptoms noted -Nitroglycerin drip which will be weaned -Discontinue vancomycin and Zosyn for now and check procalcitonin -Repeat lactic acid in a.m.  ESRD on HD MWF -Currently with volume overload and diastolic heart failure exacerbation -Hemodialysis per nephrology -Monitor renal panel  Anemia of chronic disease-stable -Monitor repeat CBC  Diabetes mellitus -Currently with stable hyperglycemia -We will plan to start SSI with carb modified diet -Holding home [place]  History of CAD -Continue statin and aspirin -Continue metoprolol and hold Imdur while on nitroglycerin drip  Hypertension -Continue home antihypertensives  Prolonged QTC -Monitor on telemetry  DVT prophylaxis: Heparin Code Status: Full  Family Communication: None at bedside Disposition Plan: Admit for hemodialysis and evaluation of diastolic congestive heart failure Consults called: Nephrology for hemodialysis Admission status: Observation, stepdown unit   Labria Wos D Trevious Rampey DO [facility]  If 7PM-7AM, please contact night-coverage [email or web address]  12/27/2018, 10:57 AM

## 2018-12-27 NOTE — Progress Notes (Signed)
*  PRELIMINARY RESULTS* Echocardiogram 2D Echocardiogram has been performed with Definity.  Samuel Germany 12/27/2018, 4:36 PM

## 2018-12-27 NOTE — ED Notes (Signed)
CRITICAL VALUE ALERT   Critical Value:  Lactic acid 2.5  Date & Time Notied:  12/27/18 & 0950 hrs  Provider Notified: Dr. Reather Converse  Orders Received/Actions taken: N/A

## 2018-12-27 NOTE — Progress Notes (Signed)
**Note De-Identified  Obfuscation** Patient removed from BIPAP and placed on 4 L HFNC, tolerating well. RRT to continue to monitor

## 2018-12-27 NOTE — Procedures (Signed)
    HEMODIALYSIS TREATMENT NOTE:  4 hour heparin-free dialysis completed via left forearm AVF (15g/antegrade). Goal met: 3.7 liters removed without interruption in ultrafiltration.  All blood was returned and hemostasis was achieved in 15 minutes.  [name], RN

## 2018-12-27 NOTE — ED Provider Notes (Signed)
[facility] EMERGENCY DEPARTMENT Provider Note   CSN: [id number] Arrival date & time: 12/27/18  [id number]     History   Chief Complaint Chief Complaint  Patient presents with  . Cough  . Shortness of Breath    HPI SHANDRIKA ARISPE is a 63 y.o. female.     Patient with history of congestive heart failure, end-stage renal disease on dialysis, last dialyzed Wednesday, recent hospitalization and discharged proximal and 1 week ago for pneumonia presents with acute shortness of breath this morning.  Patient due for dialysis today.  No fever, mild cough.  Shortness of breath is significant.  Difficulty getting details from patient due to breathing difficulty.     Past Medical History:  Diagnosis Date  . Anemia    of chronic disease  . Blood transfusion without reported diagnosis   . Cardiovascular disease    nonobstructive  . Carotid artery stenosis 2008  . CHF (congestive heart failure) ([place])   . Coronary artery disease   . Diabetes mellitus   . Dyslipidemia   . Edema of lower extremity    with hypo-albuminemia and profound protenuria  . Heart murmur   . Hypertension   . Mitral regurgitation   . Nephrotic syndrome   . Patent foramen ovale   . Pulmonary hypertension, moderate to severe ([place])   . Pulmonary nodule   . Tricuspid regurgitation   . Volume depletion, renal, due to output loss (renal deficit)     Patient Active Problem List   Diagnosis Date Noted  . Acute diastolic CHF (congestive heart failure) ([place]) 12/27/2018  . Acute hypoxemic respiratory failure ([place]) 12/16/2018  . Dyspnea 12/15/2018  . Lung collapse 12/15/2018  . HCAP (healthcare-associated pneumonia)   . Sepsis due to undetermined organism ([place]) 12/02/2018  . Lobar pneumonia ([place]) 12/02/2018  . Acute respiratory failure with hypoxia ([place]) 12/02/2018  . Hydronephrosis   . Abnormal LFTs (liver function tests) 03/11/2018  . Type 2 diabetes mellitus ([place]) 03/11/2018  . Valvular heart disease 03/11/2018   . Chronic kidney disease on chronic dialysis ([place]) 11/13/2017  . Leukocytosis 09/21/2017  . Generalized weakness 09/21/2017  . Hyperlipidemia associated with type 2 diabetes mellitus ([place]) 02/22/2017  . ESRD (end stage renal disease) ([place]) Aug 28, 2016  . 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 Encounter for aftercare following kidney transplant 05/22/2016  . Chronic diastolic heart failure ([place]) 06/08/2015  . Aortic valve sclerosis 06/08/2015  . Anemia of chronic disease 07/03/2013  . Essential hypertension 12/18/2010    Past Surgical History:  Procedure Laterality Date  . A/V FISTULAGRAM N/A 04/05/2017   Procedure: A/V FISTULAGRAM - Left Arm;  Surgeon: Angelia Mould, MD;  Location: [facility];  Service: Cardiovascular;  Laterality: N/A;  . A/V FISTULAGRAM Left 09/18/2017   Procedure: A/V FISTULAGRAM;  Surgeon: Serafina Mitchell, MD;  Location: [facility];  Service: Cardiovascular;  Laterality: Left;  . A/V SHUNT INTERVENTION Left 04/05/2017   Procedure: A/V SHUNT INTERVENTION;  Surgeon: Angelia Mould, MD;  Location: [facility];  Service: Cardiovascular;  Laterality: Left;  . CARDIAC CATHETERIZATION  2008  . IR REMOVAL TUN CV CATH W/O FL  11/02/2016  . 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 PERIPHERAL VASCULAR BALLOON ANGIOPLASTY Left 09/18/2017   Procedure: PERIPHERAL VASCULAR BALLOON ANGIOPLASTY;  Surgeon: Serafina Mitchell, MD;  Location: [facility];  Service: Cardiovascular;  Laterality: Left;  Arm Fistula     OB History   No obstetric history  on file.      Home Medications    Prior to Admission medications   Medication Sig Start Date End Date Taking? Authorizing Provider  acetaminophen (TYLENOL) 500 MG tablet Take 500 mg by mouth every 6 (six) hours as needed for moderate pain or headache.  04/06/16  Yes [provider]  amLODipine (NORVASC) 5 MG tablet Take 1 tablet by mouth nightly Patient taking  differently: Take 5 mg by mouth at bedtime.  11/25/18  Yes Hawks, Christy A, FNP  aspirin (ASPIR-LOW) 81 MG EC tablet Take 81 mg by mouth daily.    Yes [provider]  B Complex-C-Folic Acid (RENA-VITE RX) 1 MG TABS Take 1 tablet by mouth daily. 09/24/18  Yes [provider]  insulin degludec (TRESIBA FLEXTOUCH) 100 UNIT/ML SOPN FlexTouch Pen Inject 0.1 mLs (10 Units total) into the skin daily at 10 pm. 11/05/18  Yes Hawks, Christy A, FNP  isosorbide mononitrate (IMDUR) 60 MG 24 hr tablet Take 1 tablet by mouth once daily Patient taking differently: Take 60 mg by mouth daily.  11/18/18  Yes Hawks, Christy A, FNP  lidocaine-prilocaine (EMLA) cream Apply 1 application topically as needed (for dialysis).  09/08/16  Yes [provider]  predniSONE (DELTASONE) 5 MG tablet TAKE 1 TABLET BY MOUTH ONCE DAILY WITH BREAKFAST Patient taking differently: Take 5 mg by mouth daily.  04/22/18  Yes Hawks, Christy A, FNP  PROGRAF 0.5 MG capsule Take 0.5 mg by mouth daily. Take 0.5mg  by mouth each morning. 10/24/16  Yes [provider]  rosuvastatin (CRESTOR) 20 MG tablet Take 1 tablet by mouth once daily Patient taking differently: Take 20 mg by mouth daily.  10/28/18  Yes Hawks, Christy A, FNP  TRULICITY 1.5 0000000 SOPN INJECT 1 DOSE (1.5 MG) SUBCUTANEOUSLY ONCE A WEEK Patient taking differently: Inject 1.5 mg into the skin once a week.  10/02/18  Yes Hawks, Christy A, FNP  albuterol (PROVENTIL HFA;VENTOLIN HFA) 108 (90 Base) MCG/ACT inhaler Inhale 1-2 puffs into the lungs every 6 (six) hours as needed for wheezing or shortness of breath. Patient not taking: Reported on 12/03/2018 05/05/18   Triplett, Tammy, PA-C  fluticasone (FLONASE) 50 MCG/ACT nasal spray Place 2 sprays into both nostrils daily. Patient not taking: Reported on 12/03/2018 05/03/18   Sharion Balloon, FNP  meclizine (ANTIVERT) 25 MG tablet Take 1 tablet (25 mg total) by mouth 2 (two) times daily as needed for dizziness.  Patient not taking: Reported on 12/27/2018 11/26/18   Evalee [place], PA-C  metoprolol tartrate (LOPRESSOR) 25 MG tablet Take 1/2 (one-half) tablet by mouth twice daily Patient not taking: No sig reported 10/02/18   Evelina Dun A, FNP  ondansetron (ZOFRAN ODT) 4 MG disintegrating tablet 4mg  ODT q4 hours prn nausea/vomit Patient not taking: Reported on 12/27/2018 11/26/17   Milton Ferguson, MD  sevelamer carbonate (RENVELA) 800 MG tablet Take 800 mg by mouth See admin instructions. 4 tablets with meals(3 times daily) and 2 tablets with snacks(twice daily). Total of 16 tablets daily. 10/02/16   [provider]  triamcinolone ointment (KENALOG) 0.5 % Apply 1 application topically 2 (two) times daily. Patient not taking: Reported on 12/27/2018 10/17/18   Sharion Balloon, FNP    Family History Family History  Problem Relation Age of Onset  . Kidney disease Mother   . Diabetes Mother   . Diabetes Father   . Heart disease Father     Social History Social History   Tobacco Use  . Smoking status:  Never Smoker  . Smokeless tobacco: Never Used  Substance Use Topics  . Alcohol use: No  . Drug use: No     Allergies   Other and Tape   Review of Systems Review of Systems  Unable to perform ROS: Acuity of condition     Physical Exam Updated Vital Signs BP (!) 112/52   Pulse 65   Temp 98.3 F (36.8 C) (Oral)   Resp 20   Ht 5\' 9"  (1.753 m)   Wt 65.8 kg   SpO2 94%   BMI 21.41 kg/m   Physical Exam Vitals signs and nursing note reviewed.  Constitutional:      General: She is in acute distress.     Appearance: She is well-developed.  HENT:     Head: Normocephalic and atraumatic.  Eyes:     General:        Right eye: No discharge.        Left eye: No discharge.     Conjunctiva/sclera: Conjunctivae normal.  Neck:     Musculoskeletal: Normal range of motion and neck supple.     Trachea: No tracheal deviation.  Cardiovascular:     Rate and Rhythm: Normal rate and  regular rhythm.  Pulmonary:     Effort: Tachypnea and respiratory distress present.     Breath sounds: Examination of the right-middle field reveals rhonchi. Examination of the left-middle field reveals rhonchi. Examination of the right-lower field reveals rhonchi. Examination of the left-lower field reveals rhonchi. Rhonchi present.  Abdominal:     General: There is no distension.     Palpations: Abdomen is soft.     Tenderness: There is no abdominal tenderness. There is no guarding.  Musculoskeletal:     Right lower leg: Edema present.     Left lower leg: Edema present.  Skin:    General: Skin is warm.     Findings: No rash.  Neurological:     Mental Status: She is alert and oriented to person, place, and time.     GCS: GCS eye subscore is 4. GCS verbal subscore is 5. GCS motor subscore is 6.     Comments: Patient alert and oriented to loud verbal discussion and will answer brief questions.  Psychiatric:     Comments: Fatigue appearance      ED Treatments / Results  Labs (all labs ordered are listed, but only abnormal results are displayed) Labs Reviewed  CBC WITH DIFFERENTIAL/PLATELET - Abnormal; Notable for the following components:      Result Value   WBC 15.0 (*)    RBC 3.09 (*)    Hemoglobin 9.6 (*)    HCT 32.4 (*)    MCV 104.9 (*)    MCHC 29.6 (*)    Neutro Abs 13.0 (*)    Abs Immature Granulocytes 0.13 (*)    All other components within normal limits  COMPREHENSIVE METABOLIC PANEL - Abnormal; Notable for the following components:   Glucose, Bld 167 (*)    BUN 31 (*)    Creatinine, Ser 6.46 (*)    Calcium 8.0 (*)    GFR calc non Af Amer 6 (*)    GFR calc Af Amer 7 (*)    All other components within normal limits  BRAIN NATRIURETIC PEPTIDE - Abnormal; Notable for the following components:   B Natriuretic Peptide >4,500.0 (*)    All other components within normal limits  LACTIC ACID, PLASMA - Abnormal; Notable for the following components:   Lactic Acid,  Venous 2.5 (*)    All other components within normal limits  LACTIC ACID, PLASMA - Abnormal; Notable for the following components:   Lactic Acid, Venous 2.0 (*)    All other components within normal limits  TSH - Abnormal; Notable for the following components:   TSH 8.025 (*)    All other components within normal limits  CBG MONITORING, ED - Abnormal; Notable for the following components:   Glucose-Capillary 102 (*)    All other components within normal limits  TROPONIN I (HIGH SENSITIVITY) - Abnormal; Notable for the following components:   Troponin I (High Sensitivity) 61 (*)    All other components within normal limits  TROPONIN I (HIGH SENSITIVITY) - Abnormal; Notable for the following components:   Troponin I (High Sensitivity) 62 (*)    All other components within normal limits  CULTURE, BLOOD (ROUTINE X 2)  CULTURE, BLOOD (ROUTINE X 2)  SARS CORONAVIRUS 2 BY RT PCR (HOSPITAL ORDER, [facility])  MAGNESIUM  PROCALCITONIN    EKG EKG Interpretation  Date/Time:  Friday December 27 2018 07:44:06 EDT Ventricular Rate:  78 PR Interval:    QRS Duration: 114 QT Interval:  476 QTC Calculation: 543 R Axis:   -45 Text Interpretation:  Sinus rhythm Left anterior fascicular block Abnormal R-wave progression, late transition LVH with secondary repolarization abnormality Prolonged QT interval Similar to previous Confirmed by Elnora Morrison [phone]) on 12/27/2018 7:47:17 AM   Radiology Dg Chest Portable 1 View  Result Date: 12/27/2018 CLINICAL DATA:  Shortness of breath and cough EXAM: PORTABLE CHEST 1 VIEW COMPARISON:  12/15/2018 FINDINGS: Cardiac shadow is mildly enlarged but stable. Aortic calcifications are again seen. Central vascular congestion is noted increased from the prior exam with interstitial edema. No focal confluent infiltrate is seen. No bony abnormality is noted. IMPRESSION: Changes consistent with CHF. Electronically Signed   By: Inez Catalina  M.D.   On: 12/27/2018 07:50    Procedures .Critical Care Performed by: Elnora Morrison, MD Authorized by: Elnora Morrison, MD   Critical care provider statement:    Critical care time (minutes):  75   Critical care start time:  12/27/2018 8:15 AM   Critical care end time:  12/27/2018 9:30 AM   Critical care time was exclusive of:  Separately billable procedures and treating other patients and teaching time   Critical care was necessary to treat or prevent imminent or life-threatening deterioration of the following conditions:  Respiratory failure   Critical care was time spent personally by me on the following activities:  Discussions with consultants, evaluation of patient's response to treatment, examination of patient, ordering and performing treatments and interventions, ordering and review of laboratory studies, ordering and review of radiographic studies, pulse oximetry, re-evaluation of patient's condition, obtaining history from patient or surrogate and review of old charts  Ultrasound ED Peripheral IV (Provider)  Date/Time: 12/27/2018 9:31 AM Performed by: Elnora Morrison, MD Authorized by: Elnora Morrison, MD   Procedure details:    Indications: poor IV access     Location:  Right AC   Angiocath:  20 G   Bedside Ultrasound Guided: Yes     Images: archived     Patient tolerated procedure without complications: Yes   Ultrasound ED Peripheral IV (Provider)  Date/Time: 12/27/2018 9:32 AM Performed by: Elnora Morrison, MD Authorized by: Elnora Morrison, MD   Procedure details:    Indications: poor IV access     Location:  Right AC   Angiocath:  20 G   Bedside Ultrasound Guided: Yes     Images: archived     Patient tolerated procedure without complications: Yes     (including critical care time)  Medications Ordered in ED Medications  nitroGLYCERIN 50 mg in dextrose 5 % 250 mL (0.2 mg/mL) infusion (20 mcg/min Intravenous New Bag/Given 12/27/18 1027)  Chlorhexidine  Gluconate Cloth 2 % PADS 6 each (6 each Topical Given 12/27/18 1530)  sevelamer carbonate (RENVELA) tablet 3,200 mg (has no administration in time range)  ondansetron (ZOFRAN-ODT) disintegrating tablet 4 mg (has no administration in time range)  albuterol (PROVENTIL) (2.5 MG/3ML) 0.083% nebulizer solution 3 mL (has no administration in time range)  meclizine (ANTIVERT) tablet 25 mg (has no administration in time range)  acetaminophen (TYLENOL) tablet 500 mg (has no administration in time range)  aspirin EC tablet 81 mg (81 mg Oral Given 12/27/18 1424)  amLODipine (NORVASC) tablet 5 mg (has no administration in time range)  metoprolol tartrate (LOPRESSOR) tablet 12.5 mg (12.5 mg Oral Given 12/27/18 1424)  rosuvastatin (CRESTOR) tablet 20 mg (20 mg Oral Not Given 12/27/18 1500)  insulin glargine (LANTUS) injection 7 Units (has no administration in time range)  predniSONE (DELTASONE) tablet 5 mg (5 mg Oral Given 12/27/18 1423)  tacrolimus (PROGRAF) capsule 0.5 mg (has no administration in time range)  multivitamin (RENA-VIT) tablet 1 tablet (1 tablet Oral Not Given 12/27/18 1500)  lidocaine-prilocaine (EMLA) cream 1 application (has no administration in time range)  heparin injection 5,000 Units (5,000 Units Subcutaneous Given 12/27/18 1418)  sodium chloride flush (NS) 0.9 % injection 3 mL (3 mLs Intravenous Given 12/27/18 1550)  sodium chloride flush (NS) 0.9 % injection 3 mL (has no administration in time range)  0.9 %  sodium chloride infusion (has no administration in time range)  acetaminophen (TYLENOL) tablet 650 mg (has no administration in time range)    Or  acetaminophen (TYLENOL) suppository 650 mg (has no administration in time range)  insulin aspart (novoLOG) injection 0-9 Units (0 Units Subcutaneous Not Given 12/27/18 1409)  insulin aspart (novoLOG) injection 0-5 Units (has no administration in time range)  sevelamer carbonate (RENVELA) tablet 1,600 mg (has no administration in time  range)  perflutren lipid microspheres (DEFINITY) IV suspension (3 mLs Intravenous Given 12/27/18 1535)  ondansetron (ZOFRAN) injection 4 mg (4 mg Intravenous Given 12/27/18 0931)  piperacillin-tazobactam (ZOSYN) IVPB 3.375 g (0 g Intravenous Stopped 12/27/18 1021)     Initial Impression / Assessment and Plan / ED Course  I have reviewed the triage vital signs and the nursing notes.  Pertinent labs & imaging results that were available during my care of the patient were reviewed by me and considered in my medical decision making (see chart for details).       Patient presents with respiratory difficulty clinical concern for acute pulmonary edema from end-stage renal disease and heart failure however also concern for pneumonia with recent pneumonia and hospitalization in shortness of breath.  Chest x-ray reviewed signs of bilateral pulmonary edema versus infection.  Blood cultures, blood work, lactate, cardiac testing ordered.  Difficult IV and to ultrasound-guided IVs placed by myself.  Patient is a full code and is okay with intubation if needed.  Covid test sent however more concerned for fluid overload.  Nasal cannula initially during first 2 hours and plan for BiPAP.  Renal on call agreed to set up for dialysis.  CMP pending.  CMP reviewed, K normal.  Mild leukocytosis. Reviewed last admission.    Marlowe Kays  R Capelle was evaluated in Emergency Department on 12/27/2018 for the symptoms described in the history of present illness. She was evaluated in the context of the global COVID-19 pandemic, which necessitated consideration that the patient might be at risk for infection with the SARS-CoV-2 virus that causes COVID-19. Institutional protocols and algorithms that pertain to the evaluation of patients at risk for COVID-19 are in a state of rapid change based on information released by regulatory bodies including the CDC and federal and state organizations. These policies and algorithms were followed  during the patient's care in the ED.   Final Clinical Impressions(s) / ED Diagnoses   Final diagnoses:  Acute dyspnea  Acute pulmonary edema ([place])  ESRD needing dialysis [facility])    ED Discharge Orders    None       Elnora Morrison, MD 12/27/18 1559

## 2018-12-27 NOTE — Consult Note (Signed)
Referring Provider: No ref. provider found Primary Care Physician:  Sharion Balloon, FNP Primary Nephrologist:  Dr.   Luiz Iron for Consultation: Medical management end-stage renal disease, and treatment of anemia  HPI: This is a 63 year old lady with a history of congestive heart failure coronary disease end-stage renal disease hemodialysis Monday Wednesday Friday at [facility] and failed renal transplant who presents with  respiratory failure   volume overload.  She was last seen 12/19/2018 and underwent emergent dialysis.  She appears to been attending her dialysis session regularly and last dialysis treatment was 12/25/2018.  Pressure 142/99 pulse 80 temperature 98.3 O2 sats 94% 6 L nasal cannula  Sodium 138 potassium 4.1 chloride 99 CO2 27 BUN 31 creatinine 6.46 glucose 167 calcium 8.0 magnesium 2.1 phosphorus 3.9 albumin 3.5 AST 40 ALT 44 WBC 15 hemoglobin 9.6 platelets 189  Chest x-ray showed enlarged cardiac shadow aortic calcifications central vascular congestion with no focal infiltrate  Home medications albuterol inhaler, amlodipine 5 mg nightly aspirin 81 mg daily insulin, isosorbide 60 mg daily meclizine 25 mg daily metoprolol 12.5 mg twice daily, prednisone 5 mg daily Prograf 0.5 mg daily Renvela 800 mg 4 tablets with meals 2 with snacks Trulicity subcu weekly  Past Medical History:  Diagnosis Date  . Anemia    of chronic disease  . Blood transfusion without reported diagnosis   . Cardiovascular disease    nonobstructive  . Carotid artery stenosis 2008  . CHF (congestive heart failure) ([place])   . Coronary artery disease   . Diabetes mellitus   . Dyslipidemia   . Edema of lower extremity    with hypo-albuminemia and profound protenuria  . Heart murmur   . Hypertension   . Mitral regurgitation   . Nephrotic syndrome   . Patent foramen ovale   . Pulmonary hypertension, moderate to severe ([place])   . Pulmonary nodule   . Tricuspid regurgitation   . Volume depletion, renal,  due to output loss (renal deficit)     Past Surgical History:  Procedure Laterality Date  . A/V FISTULAGRAM N/A 04/05/2017   Procedure: A/V FISTULAGRAM - Left Arm;  Surgeon: Angelia Mould, MD;  Location: [place] [facility];  Service: Cardiovascular;  Laterality: N/A;  . A/V FISTULAGRAM Left 09/18/2017   Procedure: A/V FISTULAGRAM;  Surgeon: Serafina Mitchell, MD;  Location: [place] [facility];  Service: Cardiovascular;  Laterality: Left;  . A/V SHUNT INTERVENTION Left 04/05/2017   Procedure: A/V SHUNT INTERVENTION;  Surgeon: Angelia Mould, MD;  Location: [place] [facility];  Service: Cardiovascular;  Laterality: Left;  . CARDIAC CATHETERIZATION  2008  . IR REMOVAL TUN CV CATH W/O FL  11/02/2016  . 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 PERIPHERAL VASCULAR BALLOON ANGIOPLASTY Left 09/18/2017   Procedure: PERIPHERAL VASCULAR BALLOON ANGIOPLASTY;  Surgeon: Serafina Mitchell, MD;  Location: [place] [facility];  Service: Cardiovascular;  Laterality: Left;  Arm Fistula    Prior to Admission medications   Medication Sig Start Date End Date Taking? Authorizing Provider  acetaminophen (TYLENOL) 500 MG tablet Take 500 mg by mouth every 6 (six) hours as needed for moderate pain or headache.  04/06/16   [provider]  albuterol (PROVENTIL HFA;VENTOLIN HFA) 108 (90 Base) MCG/ACT inhaler Inhale 1-2 puffs into the lungs every 6 (six) hours as needed for wheezing or shortness of breath. Patient not taking: Reported on 12/03/2018 05/05/18   Triplett, Tammy, PA-C  amLODipine (NORVASC) 5 MG tablet Take 1 tablet by mouth  nightly Patient taking differently: Take 5 mg by mouth at bedtime.  11/25/18   Evelina Dun A, FNP  aspirin (ASPIR-LOW) 81 MG EC tablet Take 81 mg by mouth daily.     [provider]  B Complex-C-Folic Acid (RENA-VITE RX) 1 MG TABS Take 1 tablet by mouth daily. 09/24/18   [provider]  fluticasone (FLONASE) 50 MCG/ACT nasal spray Place 2 sprays into both  nostrils daily. Patient not taking: Reported on 12/03/2018 05/03/18   Evelina Dun A, FNP  insulin degludec (TRESIBA FLEXTOUCH) 100 UNIT/ML SOPN FlexTouch Pen Inject 0.1 mLs (10 Units total) into the skin daily at 10 pm. 11/05/18   Sharion Balloon, FNP  isosorbide mononitrate (IMDUR) 60 MG 24 hr tablet Take 1 tablet by mouth once daily Patient taking differently: Take 60 mg by mouth daily.  11/18/18   Sharion Balloon, FNP  lidocaine-prilocaine (EMLA) cream Apply 1 application topically as needed (for dialysis).  09/08/16   [provider]  meclizine (ANTIVERT) 25 MG tablet Take 1 tablet (25 mg total) by mouth 2 (two) times daily as needed for dizziness. 11/26/18   Evalee Jefferson, PA-C  metoprolol tartrate (LOPRESSOR) 25 MG tablet Take 1/2 (one-half) tablet by mouth twice daily Patient taking differently: Take 12.5 mg by mouth 2 (two) times daily.  10/02/18   Evelina Dun A, FNP  ondansetron (ZOFRAN ODT) 4 MG disintegrating tablet 4mg  ODT q4 hours prn nausea/vomit Patient taking differently: Take 4 mg by mouth 2 (two) times daily. 4mg  ODT q4 hours prn nausea/vomit 11/26/17   Milton Ferguson, MD  predniSONE (DELTASONE) 5 MG tablet TAKE 1 TABLET BY MOUTH ONCE DAILY WITH BREAKFAST 04/22/18   Hawks, Christy A, FNP  PROGRAF 0.5 MG capsule Take 0.5 mg by mouth daily. Take 0.5mg  by mouth each morning. 10/24/16   [provider]  rosuvastatin (CRESTOR) 20 MG tablet Take 1 tablet by mouth once daily Patient taking differently: Take 20 mg by mouth daily.  10/28/18   Evelina Dun A, FNP  sevelamer carbonate (RENVELA) 800 MG tablet Take 800 mg by mouth See admin instructions. 4 tablets with meals(3 times daily) and 2 tablets with snacks(twice daily). Total of 16 tablets daily. 10/02/16   [provider]  triamcinolone ointment (KENALOG) 0.5 % Apply 1 application topically 2 (two) times daily. 10/17/18   Hawks, Christy A, FNP  TRULICITY 1.5 0000000 SOPN INJECT 1 DOSE (1.5 MG) SUBCUTANEOUSLY ONCE  A WEEK Patient taking differently: Inject 1.5 mg into the skin once a week.  10/02/18   Sharion Balloon, FNP    Current Facility-Administered Medications  Medication Dose Route Frequency Provider Last Rate Last Dose  . nitroGLYCERIN 50 mg in dextrose 5 % 250 mL (0.2 mg/mL) infusion  20-200 mcg/min Intravenous Continuous Elnora Morrison, MD      . piperacillin-tazobactam (ZOSYN) IVPB 3.375 g  3.375 g Intravenous Once Elnora Morrison, MD      . vancomycin (VANCOCIN) 1,500 mg in sodium chloride 0.9 % 500 mL IVPB  1,500 mg Intravenous Once Elnora Morrison, MD       Current Outpatient Medications  Medication Sig Dispense Refill  . acetaminophen (TYLENOL) 500 MG tablet Take 500 mg by mouth every 6 (six) hours as needed for moderate pain or headache.     . albuterol (PROVENTIL HFA;VENTOLIN HFA) 108 (90 Base) MCG/ACT inhaler Inhale 1-2 puffs into the lungs every 6 (six) hours as needed for wheezing or shortness of breath. (Patient not taking: Reported on 12/03/2018) 1 Inhaler  0  . amLODipine (NORVASC) 5 MG tablet Take 1 tablet by mouth nightly (Patient taking differently: Take 5 mg by mouth at bedtime. ) 90 tablet 0  . aspirin (ASPIR-LOW) 81 MG EC tablet Take 81 mg by mouth daily.     . B Complex-C-Folic Acid (RENA-VITE RX) 1 MG TABS Take 1 tablet by mouth daily.    . fluticasone (FLONASE) 50 MCG/ACT nasal spray Place 2 sprays into both nostrils daily. (Patient not taking: Reported on 12/03/2018) 16 g 6  . insulin degludec (TRESIBA FLEXTOUCH) 100 UNIT/ML SOPN FlexTouch Pen Inject 0.1 mLs (10 Units total) into the skin daily at 10 pm. 12 pen 2  . isosorbide mononitrate (IMDUR) 60 MG 24 hr tablet Take 1 tablet by mouth once daily (Patient taking differently: Take 60 mg by mouth daily. ) 90 tablet 0  . lidocaine-prilocaine (EMLA) cream Apply 1 application topically as needed (for dialysis).   0  . meclizine (ANTIVERT) 25 MG tablet Take 1 tablet (25 mg total) by mouth 2 (two) times daily as needed for dizziness.  30 tablet 0  . metoprolol tartrate (LOPRESSOR) 25 MG tablet Take 1/2 (one-half) tablet by mouth twice daily (Patient taking differently: Take 12.5 mg by mouth 2 (two) times daily. ) 90 tablet 1  . ondansetron (ZOFRAN ODT) 4 MG disintegrating tablet 4mg  ODT q4 hours prn nausea/vomit (Patient taking differently: Take 4 mg by mouth 2 (two) times daily. 4mg  ODT q4 hours prn nausea/vomit) 12 tablet 0  . predniSONE (DELTASONE) 5 MG tablet TAKE 1 TABLET BY MOUTH ONCE DAILY WITH BREAKFAST 90 tablet 0  . PROGRAF 0.5 MG capsule Take 0.5 mg by mouth daily. Take 0.5mg  by mouth each morning.  0  . rosuvastatin (CRESTOR) 20 MG tablet Take 1 tablet by mouth once daily (Patient taking differently: Take 20 mg by mouth daily. ) 90 tablet 1  . sevelamer carbonate (RENVELA) 800 MG tablet Take 800 mg by mouth See admin instructions. 4 tablets with meals(3 times daily) and 2 tablets with snacks(twice daily). Total of 16 tablets daily.    Marland Kitchen triamcinolone ointment (KENALOG) 0.5 % Apply 1 application topically 2 (two) times daily. 60 g 0  . TRULICITY 1.5 0000000 SOPN INJECT 1 DOSE (1.5 MG) SUBCUTANEOUSLY ONCE A WEEK (Patient taking differently: Inject 1.5 mg into the skin once a week. ) 12 mL 1    Allergies as of 12/27/2018 - Review Complete 12/27/2018  Allergen Reaction Noted  . Other Other (See Comments) 03/05/2016  . Tape Other (See Comments) 03/05/2016    Family History  Problem Relation Age of Onset  . Kidney disease Mother   . Diabetes Mother   . Diabetes Father   . Heart disease Father     Social History   Socioeconomic History  . Marital status: Married    Spouse name: Not on file  . Number of children: Not on file  . Years of education: Not on file  . Highest education level: Not on file  Occupational History  . Not on file  Social Needs  . Financial resource strain: Not on file  . Food insecurity    Worry: Not on file    Inability: Not on file  . Transportation needs    Medical: Not on  file    Non-medical: Not on file  Tobacco Use  . Smoking status: Never Smoker  . Smokeless tobacco: Never Used  Substance and Sexual Activity  . Alcohol use: No  . Drug use: No  .  Sexual activity: Never  Lifestyle  . Physical activity    Days per week: Not on file    Minutes per session: Not on file  . Stress: Not on file  Relationships  . Social Herbalist on phone: Not on file    Gets together: Not on file    Attends religious service: Not on file    Active member of club or organization: Not on file    Attends meetings of clubs or organizations: Not on file    Relationship status: Not on file  . Intimate partner violence    Fear of current or ex partner: Not on file    Emotionally abused: Not on file    Physically abused: Not on file    Forced sexual activity: Not on file  Other Topics Concern  . Not on file  Social History Narrative  . Not on file    Review of Systems: Gen: No fever sweats or chills HEENT: No visual complaints, No history of Retinopathy. Normal external appearance No Epistaxis or Sore throat. No sinusitis.   CV: Acute dyspnea requiring oxygen Resp: No cough or sputum, no wheezing, no coughing up no blood, and no pleurisy. GI: Denies vomiting blood, jaundice, and fecal incontinence.   Denies dysphagia or odynophagia. GU : History of failed renal transplant MS: Denies joint pain, limitation of movement, and swelling, stiffness, low back pain, extremity pain. Denies muscle weakness, cramps, atrophy.  No use of non steroidal antiinflammatory drugs. Derm: Denies rash, itching, dry skin, hives, moles, warts, or unhealing ulcers.  Psych: Denies depression, anxiety, memory loss, suicidal ideation, hallucinations, paranoia, and confusion. Heme: Denies bruising, bleeding, and enlarged lymph nodes. Neuro: No headache.  No diplopia. No dysarthria.  No dysphasia.  No history of CVA.  No Seizures. No paresthesias.  No weakness. Endocrine history of  diabetes  Physical Exam: Vital signs in last 24 hours: Temp:  [98.3 F (36.8 C)] 98.3 F (36.8 C) (10/23 0723) Pulse Rate:  [74-94] 80 (10/23 0927) Resp:  [19-26] 26 (10/23 0927) BP: (140-167)/(58-99) 142/99 (10/23 0927) SpO2:  [88 %-96 %] 94 % (10/23 0927) Weight:  [65.8 kg] 65.8 kg (10/23 0720)   General: Chronically ill-appearing lady nondistressed Head:  Normocephalic and atraumatic. Eyes:  Sclera clear, no icterus.   Conjunctiva pink. Ears:  Normal auditory acuity. Nose:  No deformity, discharge,  or lesions. Mouth:  No deformity or lesions, dentition normal. Neck:  Supple; no masses or thyromegaly.  JVP elevated Lungs: Diffuse crackles heard throughout lung fields no wheezes Heart:  Regular rate and rhythm; no murmurs, clicks, rubs,  or gallops. Abdomen:  Soft, nontender and nondistended. No masses, hepatosplenomegaly or hernias noted. Normal bowel sounds, without guarding, and without rebound.   Msk:  Symmetrical without gross deformities. Normal posture. Pulses:  No carotid, renal, femoral bruits. DP and PT symmetrical and equal Extremities:  Without clubbing or edema.  Right upper arm fistula Neurologic:  Alert and  oriented x4;  grossly normal neurologically. Skin:  Intact without significant lesions or rashes.    Intake/Output from previous day: No intake/output data recorded. Intake/Output this shift: No intake/output data recorded.  Lab Results: Recent Labs    12/27/18 0908  WBC 15.0*  HGB 9.6*  HCT 32.4*  PLT 189   BMET Recent Labs    12/27/18 0908  NA 138  K 4.1  CL 99  CO2 27  GLUCOSE 167*  BUN 31*  CREATININE 6.46*  CALCIUM 8.0*  LFT Recent Labs    12/27/18 0908  PROT 7.3  ALBUMIN 3.5  AST 40  ALT 44  ALKPHOS 96  BILITOT 1.1   PT/INR No results for input(s): LABPROT, INR in the last 72 hours. Hepatitis Panel No results for input(s): HEPBSAG, HCVAB, HEPAIGM, HEPBIGM in the last 72 hours.  Studies/Results: Dg Chest Portable 1  View  Result Date: 12/27/2018 CLINICAL DATA:  Shortness of breath and cough EXAM: PORTABLE CHEST 1 VIEW COMPARISON:  12/15/2018 FINDINGS: Cardiac shadow is mildly enlarged but stable. Aortic calcifications are again seen. Central vascular congestion is noted increased from the prior exam with interstitial edema. No focal confluent infiltrate is seen. No bony abnormality is noted. IMPRESSION: Changes consistent with CHF. Electronically Signed   By: Inez Catalina M.D.   On: 12/27/2018 07:50    Assessment/Plan:  ESRD-Monday Wednesday Friday dialysis [facility] will plan dialysis and challenge of dry weight.  Hypertension/volume blood pressure acceptable continue to challenge estimated dry weight  Anemia of chronic illness continue ESA we will add darbepoetin 12/27/2018  Secondary hyperparathyroidism phosphorus acceptable continue Renvela  Congestive heart failure recommend 2D echo TSH and troponins will defer to primary team  Diabetes mellitus as per primary team.   LOS: 0 Sherril Croon @TODAY @9 :39 AM

## 2018-12-27 NOTE — ED Triage Notes (Signed)
Pt reports cough, sob, and chest pain.  Pt was admitted with pneumonia recently and discharged last Thursday.  Reports was tested for covid twice and both were negative.

## 2018-12-27 NOTE — ED Notes (Signed)
Date and time results received: 12/27/18 1200 (use smartphrase ".now" to insert current time)  Test: lactic Critical Value: 2.0  Name of Provider Notified: Dr. Steffanie Dunn  Orders Received? Or Actions Taken?: n/a

## 2018-12-27 NOTE — Progress Notes (Signed)
*  PRELIMINARY RESULTS* Echocardiogram 2D Echocardiogram has been performed.  [name] 12/27/2018, 1:41 PM

## 2018-12-28 DIAGNOSIS — I5031 Acute diastolic (congestive) heart failure: Secondary | ICD-10-CM | POA: Diagnosis not present

## 2018-12-28 DIAGNOSIS — J9601 Acute respiratory failure with hypoxia: Secondary | ICD-10-CM | POA: Diagnosis not present

## 2018-12-28 LAB — BASIC METABOLIC PANEL
Anion gap: 11 (ref 5–15)
BUN: 17 mg/dL (ref 8–23)
CO2: 29 mmol/L (ref 22–32)
Calcium: 8.4 mg/dL — ABNORMAL LOW (ref 8.9–10.3)
Chloride: 98 mmol/L (ref 98–111)
Creatinine, Ser: 3.35 mg/dL — ABNORMAL HIGH (ref 0.44–1.00)
GFR calc Af Amer: 16 mL/min — ABNORMAL LOW (ref >60)
GFR calc non Af Amer: 14 mL/min — ABNORMAL LOW (ref >60)
Glucose, Bld: 121 mg/dL — ABNORMAL HIGH (ref 70–99)
Potassium: 3.8 mmol/L (ref 3.5–5.1)
Sodium: 138 mmol/L (ref 135–145)

## 2018-12-28 LAB — CBC
HCT: 28 % — ABNORMAL LOW (ref 36.0–46.0)
Hemoglobin: 8.4 g/dL — ABNORMAL LOW (ref 12.0–15.0)
MCH: 31 pg (ref 26.0–34.0)
MCHC: 30 g/dL (ref 30.0–36.0)
MCV: 103.3 fL — ABNORMAL HIGH (ref 80.0–100.0)
Platelets: 145 10*3/uL — ABNORMAL LOW (ref 150–400)
RBC: 2.71 MIL/uL — ABNORMAL LOW (ref 3.87–5.11)
RDW: 14.6 % (ref 11.5–15.5)
WBC: 7.3 10*3/uL (ref 4.0–10.5)
nRBC: 0 % (ref 0.0–0.2)

## 2018-12-28 LAB — PROCALCITONIN: Procalcitonin: 1.11 ng/mL

## 2018-12-28 LAB — LACTIC ACID, PLASMA: Lactic Acid, Venous: 0.7 mmol/L (ref 0.5–1.9)

## 2018-12-28 LAB — T4, FREE: Free T4: 1.31 ng/dL — ABNORMAL HIGH (ref 0.61–1.12)

## 2018-12-28 LAB — GLUCOSE, CAPILLARY
Glucose-Capillary: 98 mg/dL (ref 70–99)
Glucose-Capillary: 207 mg/dL — ABNORMAL HIGH (ref 70–99)

## 2018-12-28 MED ORDER — SEVELAMER CARBONATE 800 MG PO TABS: 1600.0000 mg | ORAL_TABLET | ORAL | Status: DC | PRN

## 2018-12-28 NOTE — Progress Notes (Signed)
Order to wean patient to room air. Patient currently on room air while sitting up and oxygen sats are 95%.   Order to ambulate patient while checking oxygen saturations. Patient was able to ambulate a lap around the unit, while on room air, and maintained saturations around 88%. Patient had no complaints of shortness of breath nor an increased work of breathing while ambulating.

## 2018-12-28 NOTE — Discharge Summary (Signed)
Physician Discharge Summary  [name] [id number] DOB: 05-30-1955 DOA: 12/27/2018  PCP: Sharion Balloon, FNP  Admit date: 12/27/2018  Discharge date: 12/28/2018  Admitted From:Home  Disposition:  Home  Recommendations for Outpatient Follow-up:  1. Follow up with PCP in 1-2 weeks 2. Follow-up with Dr. Doneta Public, cardiologist at [facility] regarding new 2D echocardiogram findings of LVEF 40-45% as well as increased LVH and hypokinesis of the apex.  This is in comparison to 2D echocardiogram on 07/2017 with LVEF 60-65% and no wall motion abnormalities noted. 3. Continue home medications as prior 4. Continue hemodialysis schedule MWF as prior  Home Health: None  Equipment/Devices: None  Discharge Condition: Stable  CODE STATUS: Full  Diet recommendation: Heart Healthy/carb modified  Brief/Interim Summary: Per HPI: [name] is a 63 y.o. female with medical history significant for ESRD on HD MWF, grade 1 diastolic CHF and pulmonary hypertension, CAD, hypertension, diabetes, and anemia of chronic disease who presented to the ED after suddenly awakening this morning at 3 AM with shortness of breath.  She had a mild cough that was nonproductive with no fevers or chills noted.  She has been attending her hemodialysis sessions regularly with last dialysis treatment on 12/25/2018.  She was recently discharged on 10/15 after treatment for aspiration pneumonia with mucous plugging and lung collapse.  She had completed her antibiotic treatment at that time.  Patient was admitted with acute hypoxemic respiratory failure secondary to acute on chronic diastolic congestive heart failure exacerbation.  She had undergone hemodialysis with 3.7 L of fluid removed on 10/23 and had significant improvement in her symptoms.  She is doing very well this morning and continues to remain on some nasal cannula oxygen which has now been weaned.  She can ambulate without any further symptoms and is  noted to maintain her oxygen saturation.  Troponins have demonstrated a flat trend in the 60s and no new EKG changes noted.  Discussed case with cardiology Dr. Caryl Comes this morning given the new findings on 2D echocardiogram with LVEF 40-45% with increased LVH and hypokinesis of the apex.  He feels that inpatient stress testing or catheterization is not indicated at this time and that the patient should closely follow-up with her cardiologist Dr. Doneta Public at [facility].  She has not had any chest pain or other signs or symptoms of ACS noted and does appear stable and improved after her hemodialysis.  She does not have any signs of pneumonia.   Discussed the need to continue her usual hemodialysis schedule and to follow-up with her cardiologist soon for further evaluation.  Continue home medications as prior.  Discharge Diagnoses:  Active Problems:   Acute hypoxemic respiratory failure (HCC)   Acute diastolic CHF (congestive heart failure) (HCC)  Principal discharge diagnosis: Acute hypoxemic respiratory failure secondary to acute on chronic diastolic CHF exacerbation with new findings of worsening LV dysfunction.  Discharge Instructions  Discharge Instructions    Diet - low sodium heart healthy   Complete by: As directed    Increase activity slowly   Complete by: As directed      Allergies as of 12/28/2018      Reactions   Other Other (See Comments)   Tape Bruises and tears skin, Paper tape tolerated   Tape Other (See Comments)   Bruises and tears skin Paper tape tolerated Bruises and tears skin Paper tape tolerated Bruises and tears skin Paper tape tolerated      Medication List  TAKE these medications   acetaminophen 500 MG tablet Commonly known as: TYLENOL Take 500 mg by mouth every 6 (six) hours as needed for moderate pain or headache.   albuterol 108 (90 Base) MCG/ACT inhaler Commonly known as: VENTOLIN HFA Inhale 1-2 puffs into the lungs every 6 (six) hours as  needed for wheezing or shortness of breath.   amLODipine 5 MG tablet Commonly known as: NORVASC Take 1 tablet by mouth nightly   Aspir-Low 81 MG EC tablet Generic drug: aspirin Take 81 mg by mouth daily.   fluticasone 50 MCG/ACT nasal spray Commonly known as: FLONASE Place 2 sprays into both nostrils daily.   isosorbide mononitrate 60 MG 24 hr tablet Commonly known as: IMDUR Take 1 tablet by mouth once daily   lidocaine-prilocaine cream Commonly known as: EMLA Apply 1 application topically as needed (for dialysis).   meclizine 25 MG tablet Commonly known as: ANTIVERT Take 1 tablet (25 mg total) by mouth 2 (two) times daily as needed for dizziness.   metoprolol tartrate 25 MG tablet Commonly known as: LOPRESSOR Take 1/2 (one-half) tablet by mouth twice daily   ondansetron 4 MG disintegrating tablet Commonly known as: Zofran ODT 4mg  ODT q4 hours prn nausea/vomit   predniSONE 5 MG tablet Commonly known as: DELTASONE TAKE 1 TABLET BY MOUTH ONCE DAILY WITH BREAKFAST What changed: See the new instructions.   Prograf 0.5 MG capsule Generic drug: tacrolimus Take 0.5 mg by mouth daily. Take 0.5mg  by mouth each morning.   Rena-Vite Rx 1 MG Tabs Take 1 tablet by mouth daily.   rosuvastatin 20 MG tablet Commonly known as: CRESTOR Take 1 tablet by mouth once daily   sevelamer carbonate 800 MG tablet Commonly known as: RENVELA Take 800 mg by mouth See admin instructions. 4 tablets with meals(3 times daily) and 2 tablets with snacks(twice daily). Total of 16 tablets daily.   [name] 100 UNIT/ML Sopn FlexTouch Pen Generic drug: insulin degludec Inject 0.1 mLs (10 Units total) into the skin daily at 10 pm.   triamcinolone ointment 0.5 % Commonly known as: KENALOG Apply 1 application topically 2 (two) times daily.   Trulicity 1.5 0000000 Sopn Generic drug: Dulaglutide INJECT 1 DOSE (1.5 MG) SUBCUTANEOUSLY ONCE A WEEK What changed: See the new instructions.       Follow-up Information    Sharion Balloon, FNP Follow up in 1 week(s).   Specialty: Family Medicine Contact information: [place] [address] [phone]        Frederik Pear, MD. Schedule an appointment as soon as possible for a visit.   Specialty: Internal Medicine Contact information: [address] [place] [address] [phone]          Allergies  Allergen Reactions  . Other Other (See Comments)    Tape Bruises and tears skin, Paper tape tolerated  . Tape Other (See Comments)    Bruises and tears skin Paper tape tolerated Bruises and tears skin Paper tape tolerated Bruises and tears skin Paper tape tolerated    Consultations:  Discussed with cardiology Dr. Caryl Comes   Procedures/Studies: Ct Angio Chest Pe W/cm &/or Wo Cm  Result Date: 12/15/2018 CLINICAL DATA:  Acute shortness of breath and hypoxia. Dialysis patient. COVID-19 negative. EXAM: CT ANGIOGRAPHY CHEST WITH CONTRAST TECHNIQUE: Multidetector CT imaging of the chest was performed using the standard protocol during bolus administration of intravenous contrast. Multiplanar CT image reconstructions and MIPs were obtained to evaluate the vascular anatomy. CONTRAST:  161mL OMNIPAQUE  IOHEXOL 350 MG/ML SOLN COMPARISON:  Chest x-ray from same day. CT chest report dated September 04, 2006. FINDINGS: Cardiovascular: Satisfactory opacification of the pulmonary arteries to the segmental level. No evidence of pulmonary embolism. Mild cardiomegaly. No pericardial effusion. No thoracic aortic aneurysm or dissection. Coronary, aortic arch, and branch vessel atherosclerotic vascular disease. Mediastinum/Nodes: Mildly enlarged subcarinal node measuring 1.3 cm in short axis, likely reactive. No additional enlarged mediastinal, hilar, or axillary lymph nodes. The thyroid gland, trachea, and esophagus demonstrate no significant findings. Lungs/Pleura: Small to moderate bilateral pleural effusions.  Complete collapse of the left lower lobe. Partial collapse of the right lower lobe. Diffuse interlobular septal thickening throughout both lungs. Small area of peribronchovascular ground-glass density in the central right upper lobe. Mild subsegmental atelectasis in the dependent left upper and right middle lobes. No consolidation or pneumothorax. No suspicious pulmonary nodule. Upper Abdomen: No acute abnormality. Musculoskeletal: No chest wall abnormality. No acute or significant osseous findings. Multiple chronic mild thoracic compression deformities. Review of the MIP images confirms the above findings. IMPRESSION: 1.  No evidence of pulmonary embolism. 2. Findings consistent with congestive heart failure. Small to moderate bilateral pleural effusions with diffuse interstitial pulmonary edema. Small area of alveolar pulmonary edema in the central right upper lobe. 3. Complete collapse of the left lower lobe. Partial collapse of the right lower lobe. 4.  Aortic atherosclerosis (ICD10-I70.0). Electronically Signed   By: Titus Dubin M.D.   On: 12/15/2018 19:23   Dg Chest Portable 1 View  Result Date: 12/27/2018 CLINICAL DATA:  Shortness of breath and cough EXAM: PORTABLE CHEST 1 VIEW COMPARISON:  12/15/2018 FINDINGS: Cardiac shadow is mildly enlarged but stable. Aortic calcifications are again seen. Central vascular congestion is noted increased from the prior exam with interstitial edema. No focal confluent infiltrate is seen. No bony abnormality is noted. IMPRESSION: Changes consistent with CHF. Electronically Signed   By: Inez Catalina M.D.   On: 12/27/2018 07:50   Dg Chest Port 1 View  Result Date: 12/15/2018 CLINICAL DATA:  Pt reports increasing sob starting around 2am. Last dialysis on Friday and wasn't feeling well then and almost passed out EXAM: PORTABLE CHEST 1 VIEW COMPARISON:  Chest radiograph 12/02/2018 FINDINGS: Stable cardiomediastinal contours with enlarged heart size. Aortic arch  calcification. There are diffuse bilateral interstitial opacities which could represent atypical infection or edema. No pneumothorax or large pleural effusion. No acute finding in the visualized skeleton. IMPRESSION: Diffuse bilateral interstitial opacities which could represent atypical infection or edema. Electronically Signed   By: Audie Pinto M.D.   On: 12/15/2018 13:03   Dg Chest Port 1 View  Result Date: 12/02/2018 CLINICAL DATA:  Sepsis EXAM: PORTABLE CHEST 1 VIEW COMPARISON:  June 27, 2018 FINDINGS: Heart size is enlarged. There are prominent bilateral interstitial lung markings. There may be small bilateral pleural effusions. There is no pneumothorax. There is no acute osseous abnormality. IMPRESSION: Cardiomegaly with prominent interstitial lung markings may be secondary to pulmonary edema or an atypical infectious process. Electronically Signed   By: Constance Holster M.D.   On: 12/02/2018 07:56     Discharge Exam: Vitals:   12/28/18 1000 12/28/18 1015  BP: (!) 114/52 (!) 140/53  Pulse: 60 70  Resp: 14 11  Temp:    SpO2: 98% (!) 89%   Vitals:   12/28/18 0915 12/28/18 0945 12/28/18 1000 12/28/18 1015  BP:   (!) 114/52 (!) 140/53  Pulse: (!) 58 (!) 59 60 70  Resp: 10 13 14  11  Temp:      TempSrc:      SpO2: 97% 98% 98% (!) 89%  Weight:      Height:        General: Pt is alert, awake, not in acute distress Cardiovascular: RRR, S1/S2 +, no rubs, no gallops Respiratory: CTA bilaterally, no wheezing, no rhonchi Abdominal: Soft, NT, ND, bowel sounds + Extremities: no edema, no cyanosis    The results of significant diagnostics from this hospitalization (including imaging, microbiology, ancillary and laboratory) are listed below for reference.     Microbiology: Recent Results (from the past 240 hour(s))  SARS Coronavirus 2 by RT PCR (hospital order, performed in [facility] hospital lab) Nasopharyngeal Nasopharyngeal Swab     Status: None   Collection Time:  12/27/18  8:51 AM   Specimen: Nasopharyngeal Swab  Result Value Ref Range Status   SARS Coronavirus 2 NEGATIVE NEGATIVE Final    Comment: (NOTE) If result is NEGATIVE SARS-CoV-2 target nucleic acids are NOT DETECTED. The SARS-CoV-2 RNA is generally detectable in upper and lower  respiratory specimens during the acute phase of infection. The lowest  concentration of SARS-CoV-2 viral copies this assay can detect is 250  copies / mL. A negative result does not preclude SARS-CoV-2 infection  and should not be used as the sole basis for treatment or other  patient management decisions.  A negative result may occur with  improper specimen collection / handling, submission of specimen other  than nasopharyngeal swab, presence of viral mutation(s) within the  areas targeted by this assay, and inadequate number of viral copies  (<250 copies / mL). A negative result must be combined with clinical  observations, patient history, and epidemiological information. If result is POSITIVE SARS-CoV-2 target nucleic acids are DETECTED. The SARS-CoV-2 RNA is generally detectable in upper and lower  respiratory specimens dur ing the acute phase of infection.  Positive  results are indicative of active infection with SARS-CoV-2.  Clinical  correlation with patient history and other diagnostic information is  necessary to determine patient infection status.  Positive results do  not rule out bacterial infection or co-infection with other viruses. If result is PRESUMPTIVE POSTIVE SARS-CoV-2 nucleic acids MAY BE PRESENT.   A presumptive positive result was obtained on the submitted specimen  and confirmed on repeat testing.  While 2019 novel coronavirus  (SARS-CoV-2) nucleic acids may be present in the submitted sample  additional confirmatory testing may be necessary for epidemiological  and / or clinical management purposes  to differentiate between  SARS-CoV-2 and other Sarbecovirus currently known to  infect humans.  If clinically indicated additional testing with an alternate test  methodology [phone]) is advised. The SARS-CoV-2 RNA is generally  detectable in upper and lower respiratory sp ecimens during the acute  phase of infection. The expected result is Negative. Fact Sheet for Patients:  StrictlyIdeas.no Fact Sheet for Healthcare Providers: BankingDealers.co.za This test is not yet approved or cleared by the [place] FDA and has been authorized for detection and/or diagnosis of SARS-CoV-2 by FDA under an Emergency Use Authorization (EUA).  This EUA will remain in effect (meaning this test can be used) for the duration of the COVID-19 declaration under Section 564(b)(1) of the Act, 21 U.S.C. section 360bbb-3(b)(1), unless the authorization is terminated or revoked sooner. Performed at [facility], [address], [place], [place] [address]   Blood culture (routine x 2)     Status: None (Preliminary result)   Collection Time: 12/27/18  9:11 AM  Specimen: BLOOD RIGHT ARM  Result Value Ref Range Status   Specimen Description   Final    BLOOD RIGHT ARM BOTTLES DRAWN AEROBIC AND ANAEROBIC   Special Requests Blood Culture adequate volume  Final   Culture   Final    NO GROWTH < 24 HOURS Performed at [facility], [address], [place], [place] [address]    Report Status PENDING  Incomplete  Blood culture (routine x 2)     Status: None (Preliminary result)   Collection Time: 12/27/18  9:18 AM   Specimen: BLOOD LEFT ARM  Result Value Ref Range Status   Specimen Description BLOOD LEFT ARM BOTTLES DRAWN AEROBIC AND ANAEROBIC  Final   Special Requests Blood Culture adequate volume  Final   Culture   Final    NO GROWTH < 24 HOURS Performed at [facility], [address] [place], [place] [address]    Report Status PENDING  Incomplete     Labs: BNP (last 3 results) Recent Labs    12/15/18 1229 12/27/18 0910  BNP  >4,500.0* [id number]*   Basic Metabolic Panel: Recent Labs  Lab 12/27/18 0908 12/28/18 0406  NA 138 138  K 4.1 3.8  CL 99 98  CO2 27 29  GLUCOSE 167* 121*  BUN 31* 17  CREATININE 6.46* 3.35*  CALCIUM 8.0* 8.4*  MG 2.1  --    Liver Function Tests: Recent Labs  Lab 12/27/18 0908  AST 40  ALT 44  ALKPHOS 96  BILITOT 1.1  PROT 7.3  ALBUMIN 3.5   No results for input(s): LIPASE, AMYLASE in the last 168 hours. No results for input(s): AMMONIA in the last 168 hours. CBC: Recent Labs  Lab 12/27/18 0908 12/28/18 0406  WBC 15.0* 7.3  NEUTROABS 13.0*  --   HGB 9.6* 8.4*  HCT 32.4* 28.0*  MCV 104.9* 103.3*  PLT 189 145*   Cardiac Enzymes: No results for input(s): CKTOTAL, CKMB, CKMBINDEX, TROPONINI in the last 168 hours. BNP: Invalid input(s): POCBNP CBG: Recent Labs  Lab 12/27/18 1327 12/27/18 1643 12/27/18 2059 12/28/18 0723  GLUCAP 102* 114* 124* 98   D-Dimer No results for input(s): DDIMER in the last 72 hours. Hgb A1c No results for input(s): HGBA1C in the last 72 hours. Lipid Profile No results for input(s): CHOL, HDL, LDLCALC, TRIG, CHOLHDL, LDLDIRECT in the last 72 hours. Thyroid function studies Recent Labs    12/27/18 1048  TSH 8.025*   Anemia work up No results for input(s): VITAMINB12, FOLATE, FERRITIN, TIBC, IRON, RETICCTPCT in the last 72 hours. Urinalysis    Component Value Date/Time   COLORURINE YELLOW 12/02/2018 0622   APPEARANCEUR HAZY (A) 12/02/2018 0622   APPEARANCEUR Cloudy (A) 11/25/2018 1202   LABSPEC 1.013 12/02/2018 0622   PHURINE 7.0 12/02/2018 0622   GLUCOSEU 150 (A) 12/02/2018 0622   HGBUR SMALL (A) 12/02/2018 0622   BILIRUBINUR NEGATIVE 12/02/2018 0622   BILIRUBINUR Negative 11/25/2018 1202   KETONESUR NEGATIVE 12/02/2018 0622   PROTEINUR 100 (A) 12/02/2018 0622   NITRITE NEGATIVE 12/02/2018 0622   LEUKOCYTESUR LARGE (A) 12/02/2018 0622   Sepsis Labs Invalid input(s): PROCALCITONIN,  WBC,   LACTICIDVEN Microbiology Recent Results (from the past 240 hour(s))  SARS Coronavirus 2 by RT PCR (hospital order, performed in [place] hospital lab) Nasopharyngeal Nasopharyngeal Swab     Status: None   Collection Time: 12/27/18  8:51 AM   Specimen: Nasopharyngeal Swab  Result Value Ref Range Status   SARS Coronavirus 2 NEGATIVE NEGATIVE Final  Comment: (NOTE) If result is NEGATIVE SARS-CoV-2 target nucleic acids are NOT DETECTED. The SARS-CoV-2 RNA is generally detectable in upper and lower  respiratory specimens during the acute phase of infection. The lowest  concentration of SARS-CoV-2 viral copies this assay can detect is 250  copies / mL. A negative result does not preclude SARS-CoV-2 infection  and should not be used as the sole basis for treatment or other  patient management decisions.  A negative result may occur with  improper specimen collection / handling, submission of specimen other  than nasopharyngeal swab, presence of viral mutation(s) within the  areas targeted by this assay, and inadequate number of viral copies  (<250 copies / mL). A negative result must be combined with clinical  observations, patient history, and epidemiological information. If result is POSITIVE SARS-CoV-2 target nucleic acids are DETECTED. The SARS-CoV-2 RNA is generally detectable in upper and lower  respiratory specimens dur ing the acute phase of infection.  Positive  results are indicative of active infection with SARS-CoV-2.  Clinical  correlation with patient history and other diagnostic information is  necessary to determine patient infection status.  Positive results do  not rule out bacterial infection or co-infection with other viruses. If result is PRESUMPTIVE POSTIVE SARS-CoV-2 nucleic acids MAY BE PRESENT.   A presumptive positive result was obtained on the submitted specimen  and confirmed on repeat testing.  While 2019 novel coronavirus  (SARS-CoV-2) nucleic acids may  be present in the submitted sample  additional confirmatory testing may be necessary for epidemiological  and / or clinical management purposes  to differentiate between  SARS-CoV-2 and other Sarbecovirus currently known to infect humans.  If clinically indicated additional testing with an alternate test  methodology [phone]) is advised. The SARS-CoV-2 RNA is generally  detectable in upper and lower respiratory sp ecimens during the acute  phase of infection. The expected result is Negative. Fact Sheet for Patients:  StrictlyIdeas.no Fact Sheet for Healthcare Providers: BankingDealers.co.za This test is not yet approved or cleared by the [place] FDA and has been authorized for detection and/or diagnosis of SARS-CoV-2 by FDA under an Emergency Use Authorization (EUA).  This EUA will remain in effect (meaning this test can be used) for the duration of the COVID-19 declaration under Section 564(b)(1) of the Act, 21 U.S.C. section 360bbb-3(b)(1), unless the authorization is terminated or revoked sooner. Performed at [facility], [address], [place], [place] [address]   Blood culture (routine x 2)     Status: None (Preliminary result)   Collection Time: 12/27/18  9:11 AM   Specimen: BLOOD RIGHT ARM  Result Value Ref Range Status   Specimen Description   Final    BLOOD RIGHT ARM BOTTLES DRAWN AEROBIC AND ANAEROBIC   Special Requests Blood Culture adequate volume  Final   Culture   Final    NO GROWTH < 24 HOURS Performed at [facility], [address] [place], [place] [address]    Report Status PENDING  Incomplete  Blood culture (routine x 2)     Status: None (Preliminary result)   Collection Time: 12/27/18  9:18 AM   Specimen: BLOOD LEFT ARM  Result Value Ref Range Status   Specimen Description BLOOD LEFT ARM BOTTLES DRAWN AEROBIC AND ANAEROBIC  Final   Special Requests Blood Culture adequate volume  Final   Culture   Final     NO GROWTH < 24 HOURS Performed at [facility], [address] [place], [place] [address]  Report Status PENDING  Incomplete     Time coordinating discharge: 40 minutes  SIGNED:   Rodena Goldmann, DO [facility] 12/28/2018, 10:40 AM  If 7PM-7AM, please contact night-coverage [email or web address]

## 2018-12-30 DIAGNOSIS — J189 Pneumonia, unspecified organism: Secondary | ICD-10-CM | POA: Diagnosis not present

## 2018-12-30 DIAGNOSIS — Z23 Encounter for immunization: Secondary | ICD-10-CM | POA: Diagnosis not present

## 2018-12-30 DIAGNOSIS — N186 End stage renal disease: Secondary | ICD-10-CM | POA: Diagnosis not present

## 2018-12-30 DIAGNOSIS — Z992 Dependence on renal dialysis: Secondary | ICD-10-CM | POA: Diagnosis not present

## 2019-01-01 DIAGNOSIS — J189 Pneumonia, unspecified organism: Secondary | ICD-10-CM | POA: Diagnosis not present

## 2019-01-01 DIAGNOSIS — Z992 Dependence on renal dialysis: Secondary | ICD-10-CM | POA: Diagnosis not present

## 2019-01-01 DIAGNOSIS — N186 End stage renal disease: Secondary | ICD-10-CM | POA: Diagnosis not present

## 2019-01-01 DIAGNOSIS — Z23 Encounter for immunization: Secondary | ICD-10-CM | POA: Diagnosis not present

## 2019-01-01 LAB — CULTURE, BLOOD (ROUTINE X 2)
Culture: NO GROWTH
Culture: NO GROWTH
Special Requests: ADEQUATE
Special Requests: ADEQUATE

## 2019-01-03 DIAGNOSIS — Z992 Dependence on renal dialysis: Secondary | ICD-10-CM | POA: Diagnosis not present

## 2019-01-03 DIAGNOSIS — N186 End stage renal disease: Secondary | ICD-10-CM | POA: Diagnosis not present

## 2019-01-03 DIAGNOSIS — J189 Pneumonia, unspecified organism: Secondary | ICD-10-CM | POA: Diagnosis not present

## 2019-01-03 DIAGNOSIS — Z23 Encounter for immunization: Secondary | ICD-10-CM | POA: Diagnosis not present

## 2019-01-04 DIAGNOSIS — N186 End stage renal disease: Secondary | ICD-10-CM | POA: Diagnosis not present

## 2019-01-04 DIAGNOSIS — Z992 Dependence on renal dialysis: Secondary | ICD-10-CM | POA: Diagnosis not present

## 2019-01-06 DIAGNOSIS — Z992 Dependence on renal dialysis: Secondary | ICD-10-CM | POA: Diagnosis not present

## 2019-01-06 DIAGNOSIS — N186 End stage renal disease: Secondary | ICD-10-CM | POA: Diagnosis not present

## 2019-01-07 ENCOUNTER — Ambulatory Visit: Payer: BC Managed Care – PPO | Admitting: Family

## 2019-01-07 DIAGNOSIS — I5022 Chronic systolic (congestive) heart failure: Secondary | ICD-10-CM | POA: Diagnosis not present

## 2019-01-08 ENCOUNTER — Encounter: Payer: Self-pay | Admitting: Family Medicine

## 2019-01-08 ENCOUNTER — Other Ambulatory Visit: Payer: Self-pay

## 2019-01-08 ENCOUNTER — Ambulatory Visit (INDEPENDENT_AMBULATORY_CARE_PROVIDER_SITE_OTHER): Payer: BC Managed Care – PPO | Admitting: Family Medicine

## 2019-01-08 DIAGNOSIS — N186 End stage renal disease: Secondary | ICD-10-CM | POA: Diagnosis not present

## 2019-01-08 DIAGNOSIS — Z992 Dependence on renal dialysis: Secondary | ICD-10-CM | POA: Diagnosis not present

## 2019-01-08 DIAGNOSIS — R3989 Other symptoms and signs involving the genitourinary system: Secondary | ICD-10-CM | POA: Diagnosis not present

## 2019-01-08 MED ORDER — CEFDINIR 300 MG PO CAPS: 300.0000 mg | ORAL_CAPSULE | ORAL | 0 refills | Status: AC

## 2019-01-08 NOTE — Patient Instructions (Signed)
Urinary Tract Infection, Adult A urinary tract infection (UTI) is an infection of any part of the urinary tract. The urinary tract includes:  The kidneys.  The ureters.  The bladder.  The urethra. These organs make, store, and get rid of pee (urine) in the body. What are the causes? This is caused by germs (bacteria) in your genital area. These germs grow and cause swelling (inflammation) of your urinary tract. What increases the risk? You are more likely to develop this condition if:  You have a small, thin tube (catheter) to drain pee.  You cannot control when you pee or poop (incontinence).  You are female, and: ? You use these methods to prevent pregnancy: ? A medicine that kills sperm (spermicide). ? A device that blocks sperm (diaphragm). ? You have low levels of a female hormone (estrogen). ? You are pregnant.  You have genes that add to your risk.  You are sexually active.  You take antibiotic medicines.  You have trouble peeing because of: ? A prostate that is bigger than normal, if you are female. ? A blockage in the part of your body that drains pee from the bladder (urethra). ? A kidney stone. ? A nerve condition that affects your bladder (neurogenic bladder). ? Not getting enough to drink. ? Not peeing often enough.  You have other conditions, such as: ? Diabetes. ? A weak disease-fighting system (immune system). ? Sickle cell disease. ? Gout. ? Injury of the spine. What are the signs or symptoms? Symptoms of this condition include:  Needing to pee right away (urgently).  Peeing often.  Peeing small amounts often.  Pain or burning when peeing.  Blood in the pee.  Pee that smells bad or not like normal.  Trouble peeing.  Pee that is cloudy.  Fluid coming from the vagina, if you are female.  Pain in the belly or lower back. Other symptoms include:  Throwing up (vomiting).  No urge to eat.  Feeling mixed up (confused).  Being tired  and grouchy (irritable).  A fever.  Watery poop (diarrhea). How is this treated? This condition may be treated with:  Antibiotic medicine.  Other medicines.  Drinking enough water. Follow these instructions at home:  Medicines  Take over-the-counter and prescription medicines only as told by your doctor.  If you were prescribed an antibiotic medicine, take it as told by your doctor. Do not stop taking it even if you start to feel better. General instructions  Make sure you: ? Pee until your bladder is empty. ? Do not hold pee for a long time. ? Empty your bladder after sex. ? Wipe from front to back after pooping if you are a female. Use each tissue one time when you wipe.  Drink enough fluid to keep your pee pale yellow.  Keep all follow-up visits as told by your doctor. This is important. Contact a doctor if:  You do not get better after 1-2 days.  Your symptoms go away and then come back. Get help right away if:  You have very bad back pain.  You have very bad pain in your lower belly.  You have a fever.  You are sick to your stomach (nauseous).  You are throwing up. Summary  A urinary tract infection (UTI) is an infection of any part of the urinary tract.  This condition is caused by germs in your genital area.  There are many risk factors for a UTI. These include having a small, thin   tube to drain pee and not being able to control when you pee or poop.  Treatment includes antibiotic medicines for germs.  Drink enough fluid to keep your pee pale yellow. This information is not intended to replace advice given to you by your health care provider. Make sure you discuss any questions you have with your health care provider. Document Released: 08/09/2007 Document Revised: 02/07/2018 Document Reviewed: 08/30/2017 Elsevier Patient Education  2020 Elsevier Inc[employer]  

## 2019-01-08 NOTE — Progress Notes (Signed)
Virtual Visit via Telephone Note  I connected with [name] on 01/08/19 at 2:02 PM by telephone and verified that I am speaking with the correct person using two identifiers. [name] is currently located at home and nobody is currently with her during this visit. The provider, Loman Brooklyn, FNP is located in their home at time of visit.  I discussed the limitations, risks, security and privacy concerns of performing an evaluation and management service by telephone and the availability of in person appointments. I also discussed with the patient that there may be a patient responsible charge related to this service. The patient expressed understanding and agreed to proceed.  Subjective: PCP: Sharion Balloon, FNP  Chief Complaint  Patient presents with  . Urinary Tract Infection   Urinary Tract Infection: Patient complains of abnormal smelling urine, dysuria and inability to void She has had symptoms for today only. Patient denies back pain, fever, stomach ache and vaginal discharge. Patient does have a history of recurrent UTI.  Patient does not have a history of pyelonephritis.    ROS: Per HPI  Current Outpatient Medications:  .  acetaminophen (TYLENOL) 500 MG tablet, Take 500 mg by mouth every 6 (six) hours as needed for moderate pain or headache. , Disp: , Rfl:  .  albuterol (PROVENTIL HFA;VENTOLIN HFA) 108 (90 Base) MCG/ACT inhaler, Inhale 1-2 puffs into the lungs every 6 (six) hours as needed for wheezing or shortness of breath. (Patient not taking: Reported on 12/03/2018), Disp: 1 Inhaler, Rfl: 0 .  amLODipine (NORVASC) 5 MG tablet, Take 1 tablet by mouth nightly (Patient taking differently: Take 5 mg by mouth at bedtime. ), Disp: 90 tablet, Rfl: 0 .  aspirin (ASPIR-LOW) 81 MG EC tablet, Take 81 mg by mouth daily. , Disp: , Rfl:  .  B Complex-C-Folic Acid (RENA-VITE RX) 1 MG TABS, Take 1 tablet by mouth daily., Disp: , Rfl:  .  fluticasone (FLONASE) 50 MCG/ACT nasal  spray, Place 2 sprays into both nostrils daily. (Patient not taking: Reported on 12/03/2018), Disp: 16 g, Rfl: 6 .  insulin degludec (TRESIBA FLEXTOUCH) 100 UNIT/ML SOPN FlexTouch Pen, Inject 0.1 mLs (10 Units total) into the skin daily at 10 pm., Disp: 12 pen, Rfl: 2 .  isosorbide mononitrate (IMDUR) 60 MG 24 hr tablet, Take 1 tablet by mouth once daily (Patient taking differently: Take 60 mg by mouth daily. ), Disp: 90 tablet, Rfl: 0 .  lidocaine-prilocaine (EMLA) cream, Apply 1 application topically as needed (for dialysis). , Disp: , Rfl: 0 .  meclizine (ANTIVERT) 25 MG tablet, Take 1 tablet (25 mg total) by mouth 2 (two) times daily as needed for dizziness. (Patient not taking: Reported on 12/27/2018), Disp: 30 tablet, Rfl: 0 .  metoprolol tartrate (LOPRESSOR) 25 MG tablet, Take 1/2 (one-half) tablet by mouth twice daily (Patient not taking: No sig reported), Disp: 90 tablet, Rfl: 1 .  ondansetron (ZOFRAN ODT) 4 MG disintegrating tablet, 4mg  ODT q4 hours prn nausea/vomit (Patient not taking: Reported on 12/27/2018), Disp: 12 tablet, Rfl: 0 .  predniSONE (DELTASONE) 5 MG tablet, TAKE 1 TABLET BY MOUTH ONCE DAILY WITH BREAKFAST (Patient taking differently: Take 5 mg by mouth daily. ), Disp: 90 tablet, Rfl: 0 .  PROGRAF 0.5 MG capsule, Take 0.5 mg by mouth daily. Take 0.5mg  by mouth each morning., Disp: , Rfl: 0 .  rosuvastatin (CRESTOR) 20 MG tablet, Take 1 tablet by mouth once daily (Patient taking differently: Take 20 mg by  mouth daily. ), Disp: 90 tablet, Rfl: 1 .  sevelamer carbonate (RENVELA) 800 MG tablet, Take 800 mg by mouth See admin instructions. 4 tablets with meals(3 times daily) and 2 tablets with snacks(twice daily). Total of 16 tablets daily., Disp: , Rfl:  .  triamcinolone ointment (KENALOG) 0.5 %, Apply 1 application topically 2 (two) times daily. (Patient not taking: Reported on 12/27/2018), Disp: 60 g, Rfl: 0 .  TRULICITY 1.5 0000000 SOPN, INJECT 1 DOSE (1.5 MG) SUBCUTANEOUSLY ONCE  A WEEK (Patient taking differently: Inject 1.5 mg into the skin once a week. ), Disp: 12 mL, Rfl: 1  Allergies  Allergen Reactions  . Other Other (See Comments)    Tape Bruises and tears skin, Paper tape tolerated  . Tape Other (See Comments)    Bruises and tears skin Paper tape tolerated Bruises and tears skin Paper tape tolerated Bruises and tears skin Paper tape tolerated   Past Medical History:  Diagnosis Date  . Anemia    of chronic disease  . Blood transfusion without reported diagnosis   . Cardiovascular disease    nonobstructive  . Carotid artery stenosis 2008  . CHF (congestive heart failure) ([place])   . Coronary artery disease   . Diabetes mellitus   . Dyslipidemia   . Edema of lower extremity    with hypo-albuminemia and profound protenuria  . Heart murmur   . Hypertension   . Mitral regurgitation   . Nephrotic syndrome   . Patent foramen ovale   . Pulmonary hypertension, moderate to severe ([place])   . Pulmonary nodule   . Tricuspid regurgitation   . Volume depletion, renal, due to output loss (renal deficit)     Observations/Objective: A&O  No respiratory distress or wheezing audible over the phone Mood, judgement, and thought processes all WNL  Assessment and Plan: 1-2. Suspected UTI/ESRD (end stage renal disease) on dialysis [facility]) - Education provided on UTIs.  - cefdinir (OMNICEF) 300 MG capsule; Take 1 capsule (300 mg total) by mouth every other day for 3 doses.  Dispense: 3 capsule; Refill: 0   Follow Up Instructions:  I discussed the assessment and treatment plan with the patient. The patient was provided an opportunity to ask questions and all were answered. The patient agreed with the plan and demonstrated an understanding of the instructions.   The patient was advised to call back or seek an in-person evaluation if the symptoms worsen or if the condition fails to improve as anticipated.  The above assessment and management plan was discussed  with the patient. The patient verbalized understanding of and has agreed to the management plan. Patient is aware to call the clinic if symptoms persist or worsen. Patient is aware when to return to the clinic for a follow-up visit. Patient educated on when it is appropriate to go to the emergency department.   Time call ended: 2:08 PM  I provided 8 minutes of non-face-to-face time during this encounter.  Hendricks Limes, MSN, APRN, FNP-C [facility] 01/08/19

## 2019-01-09 ENCOUNTER — Ambulatory Visit: Payer: BC Managed Care – PPO | Admitting: Family

## 2019-01-10 ENCOUNTER — Other Ambulatory Visit: Payer: Self-pay

## 2019-01-10 ENCOUNTER — Ambulatory Visit (HOSPITAL_COMMUNITY)
Admission: RE | Admit: 2019-01-10 | Discharge: 2019-01-10 | Disposition: A | Discharge status: Home / Self Care | Payer: BC Managed Care – PPO | Source: Ambulatory Visit | Attending: Family | Admitting: Family

## 2019-01-10 DIAGNOSIS — N186 End stage renal disease: Secondary | ICD-10-CM | POA: Diagnosis not present

## 2019-01-10 DIAGNOSIS — J9311 Primary spontaneous pneumothorax: Secondary | ICD-10-CM

## 2019-01-10 DIAGNOSIS — J939 Pneumothorax, unspecified: Secondary | ICD-10-CM | POA: Diagnosis not present

## 2019-01-10 DIAGNOSIS — Z992 Dependence on renal dialysis: Secondary | ICD-10-CM | POA: Diagnosis not present

## 2019-01-12 DIAGNOSIS — R0602 Shortness of breath: Secondary | ICD-10-CM | POA: Diagnosis not present

## 2019-01-12 DIAGNOSIS — K219 Gastro-esophageal reflux disease without esophagitis: Secondary | ICD-10-CM | POA: Diagnosis not present

## 2019-01-12 DIAGNOSIS — I132 Hypertensive heart and chronic kidney disease with heart failure and with stage 5 chronic kidney disease, or end stage renal disease: Secondary | ICD-10-CM | POA: Diagnosis not present

## 2019-01-12 DIAGNOSIS — Z794 Long term (current) use of insulin: Secondary | ICD-10-CM | POA: Diagnosis not present

## 2019-01-12 DIAGNOSIS — I5030 Unspecified diastolic (congestive) heart failure: Secondary | ICD-10-CM | POA: Diagnosis not present

## 2019-01-12 DIAGNOSIS — J90 Pleural effusion, not elsewhere classified: Secondary | ICD-10-CM | POA: Diagnosis not present

## 2019-01-12 DIAGNOSIS — N186 End stage renal disease: Secondary | ICD-10-CM | POA: Diagnosis not present

## 2019-01-12 DIAGNOSIS — M7989 Other specified soft tissue disorders: Secondary | ICD-10-CM | POA: Diagnosis not present

## 2019-01-12 DIAGNOSIS — D631 Anemia in chronic kidney disease: Secondary | ICD-10-CM | POA: Diagnosis not present

## 2019-01-12 DIAGNOSIS — Z992 Dependence on renal dialysis: Secondary | ICD-10-CM | POA: Diagnosis not present

## 2019-01-12 DIAGNOSIS — Z79899 Other long term (current) drug therapy: Secondary | ICD-10-CM | POA: Diagnosis not present

## 2019-01-12 DIAGNOSIS — R9431 Abnormal electrocardiogram [ECG] [EKG]: Secondary | ICD-10-CM | POA: Diagnosis not present

## 2019-01-12 DIAGNOSIS — J810 Acute pulmonary edema: Secondary | ICD-10-CM | POA: Diagnosis not present

## 2019-01-12 DIAGNOSIS — I272 Pulmonary hypertension, unspecified: Secondary | ICD-10-CM | POA: Diagnosis not present

## 2019-01-12 DIAGNOSIS — Z7982 Long term (current) use of aspirin: Secondary | ICD-10-CM | POA: Diagnosis not present

## 2019-01-12 DIAGNOSIS — R918 Other nonspecific abnormal finding of lung field: Secondary | ICD-10-CM | POA: Diagnosis not present

## 2019-01-12 DIAGNOSIS — N3090 Cystitis, unspecified without hematuria: Secondary | ICD-10-CM | POA: Diagnosis not present

## 2019-01-12 DIAGNOSIS — I2510 Atherosclerotic heart disease of native coronary artery without angina pectoris: Secondary | ICD-10-CM | POA: Diagnosis not present

## 2019-01-12 DIAGNOSIS — R079 Chest pain, unspecified: Secondary | ICD-10-CM | POA: Diagnosis not present

## 2019-01-12 DIAGNOSIS — E785 Hyperlipidemia, unspecified: Secondary | ICD-10-CM | POA: Diagnosis not present

## 2019-01-12 DIAGNOSIS — I5043 Acute on chronic combined systolic (congestive) and diastolic (congestive) heart failure: Secondary | ICD-10-CM | POA: Diagnosis not present

## 2019-01-12 DIAGNOSIS — E1122 Type 2 diabetes mellitus with diabetic chronic kidney disease: Secondary | ICD-10-CM | POA: Diagnosis not present

## 2019-01-14 ENCOUNTER — Ambulatory Visit: Payer: BC Managed Care – PPO | Admitting: Family

## 2019-01-15 DIAGNOSIS — N186 End stage renal disease: Secondary | ICD-10-CM | POA: Diagnosis not present

## 2019-01-15 DIAGNOSIS — Z992 Dependence on renal dialysis: Secondary | ICD-10-CM | POA: Diagnosis not present

## 2019-01-17 DIAGNOSIS — Z992 Dependence on renal dialysis: Secondary | ICD-10-CM | POA: Diagnosis not present

## 2019-01-17 DIAGNOSIS — N186 End stage renal disease: Secondary | ICD-10-CM | POA: Diagnosis not present

## 2019-01-20 DIAGNOSIS — Z992 Dependence on renal dialysis: Secondary | ICD-10-CM | POA: Diagnosis not present

## 2019-01-20 DIAGNOSIS — N186 End stage renal disease: Secondary | ICD-10-CM | POA: Diagnosis not present

## 2019-01-21 ENCOUNTER — Telehealth (INDEPENDENT_AMBULATORY_CARE_PROVIDER_SITE_OTHER): Payer: BC Managed Care – PPO | Admitting: Family

## 2019-01-21 DIAGNOSIS — Z87440 Personal history of urinary (tract) infections: Secondary | ICD-10-CM | POA: Diagnosis not present

## 2019-01-21 DIAGNOSIS — I5032 Chronic diastolic (congestive) heart failure: Secondary | ICD-10-CM | POA: Diagnosis not present

## 2019-01-21 DIAGNOSIS — N186 End stage renal disease: Secondary | ICD-10-CM

## 2019-01-21 DIAGNOSIS — Z09 Encounter for follow-up examination after completed treatment for conditions other than malignant neoplasm: Secondary | ICD-10-CM

## 2019-01-21 NOTE — Progress Notes (Signed)
   Virtual Visit via telephone Note Due to COVID-19 pandemic this visit was conducted virtually. This visit type was conducted due to national recommendations for restrictions regarding the COVID-19 Pandemic (e.g. social distancing, sheltering in place) in an effort to limit this patient's exposure and mitigate transmission in our community. All issues noted in this document were discussed and addressed.  A physical exam was not performed with this format.  I connected with [name] on 01/21/19 at 10:05 AM  by telephone and verified that I am speaking with the correct person using two identifiers. [name] is currently located at home and no one is currently with her during visit. The provider, Evelina Dun, FNP is located in their office at time of visit.  I discussed the limitations, risks, security and privacy concerns of performing an evaluation and management service by telephone and the availability of in person appointments. I also discussed with the patient that there may be a patient responsible charge related to this service. The patient expressed understanding and agreed to proceed.   History and Present Illness:  HPI  PT went to the ED on 01/12/19 with SOB and was admitted to [facility] and discharged on 12/14/18. She was diagnosed with Volume Overload, CHF. She has ESRD and on dialysis on M, W, F.  She also had a UTI.  She was discharged on Omnicef and completed this. She denies any dysuria, hematuria, flank pain, or vaginal discharge.   Review of Systems  Constitutional: Positive for malaise/fatigue.  All other systems reviewed and are negative.    Observations/Objective: No SOB or distress noted   Assessment and Plan: 1. Hospital discharge follow-up - CMP14+EGFR - CBC with Differential/Platelet - urinalysis- dip and micro - Urine culture  2. History of UTI - CMP14+EGFR - CBC with Differential/Platelet - urinalysis- dip and micro - Urine  culture  3. Chronic diastolic heart failure (HCC) - CMP14+EGFR - CBC with Differential/Platelet  4. ESRD (end stage renal disease) ([place]) - CMP14+EGFR - CBC with Differential/Platelet  Keep Dialysis appts Labs pending Fluid restriction  Call if fever, SOB, or weight gain   I discussed the assessment and treatment plan with the patient. The patient was provided an opportunity to ask questions and all were answered. The patient agreed with the plan and demonstrated an understanding of the instructions.   The patient was advised to call back or seek an in-person evaluation if the symptoms worsen or if the condition fails to improve as anticipated.  The above assessment and management plan was discussed with the patient. The patient verbalized understanding of and has agreed to the management plan. Patient is aware to call the clinic if symptoms persist or worsen. Patient is aware when to return to the clinic for a follow-up visit. Patient educated on when it is appropriate to go to the emergency department.   Time call ended: 10:40 AM   I provided 35 minutes of non-face-to-face time during this encounter.    Evelina Dun, FNP

## 2019-01-22 DIAGNOSIS — N186 End stage renal disease: Secondary | ICD-10-CM | POA: Diagnosis not present

## 2019-01-22 DIAGNOSIS — Z992 Dependence on renal dialysis: Secondary | ICD-10-CM | POA: Diagnosis not present

## 2019-01-23 ENCOUNTER — Other Ambulatory Visit: Payer: BC Managed Care – PPO

## 2019-01-23 ENCOUNTER — Other Ambulatory Visit: Payer: Self-pay

## 2019-01-23 DIAGNOSIS — J181 Lobar pneumonia, unspecified organism: Secondary | ICD-10-CM | POA: Diagnosis not present

## 2019-01-23 DIAGNOSIS — N186 End stage renal disease: Secondary | ICD-10-CM | POA: Diagnosis not present

## 2019-01-23 DIAGNOSIS — J189 Pneumonia, unspecified organism: Secondary | ICD-10-CM

## 2019-01-23 DIAGNOSIS — Z87440 Personal history of urinary (tract) infections: Secondary | ICD-10-CM | POA: Diagnosis not present

## 2019-01-23 DIAGNOSIS — Z09 Encounter for follow-up examination after completed treatment for conditions other than malignant neoplasm: Secondary | ICD-10-CM

## 2019-01-23 LAB — URINALYSIS, COMPLETE
Bilirubin, UA: NEGATIVE
Ketones, UA: NEGATIVE
Nitrite, UA: NEGATIVE
Specific Gravity, UA: 1.015 (ref 1.005–1.030)
Urobilinogen, Ur: 0.2 mg/dL (ref 0.2–1.0)
pH, UA: 7 (ref 5.0–7.5)

## 2019-01-23 LAB — MICROSCOPIC EXAMINATION: WBC, UA: >30 /hpf — AB (ref 0–5)

## 2019-01-24 DIAGNOSIS — Z992 Dependence on renal dialysis: Secondary | ICD-10-CM | POA: Diagnosis not present

## 2019-01-24 DIAGNOSIS — N186 End stage renal disease: Secondary | ICD-10-CM | POA: Diagnosis not present

## 2019-01-24 LAB — CMP14+EGFR
ALT: 23 IU/L (ref 0–32)
AST: 23 IU/L (ref 0–40)
Albumin/Globulin Ratio: 1.4 (ref 1.2–2.2)
Albumin: 3.9 g/dL (ref 3.8–4.8)
Alkaline Phosphatase: 103 IU/L (ref 39–117)
BUN/Creatinine Ratio: 5 — ABNORMAL LOW (ref 12–28)
BUN: 28 mg/dL — ABNORMAL HIGH (ref 8–27)
Bilirubin Total: 0.5 mg/dL (ref 0.0–1.2)
CO2: 29 mmol/L (ref 20–29)
Calcium: 8.8 mg/dL (ref 8.7–10.3)
Chloride: 95 mmol/L — ABNORMAL LOW (ref 96–106)
Creatinine, Ser: 5.61 mg/dL (ref 0.57–1.00)
GFR calc Af Amer: 9 mL/min/{1.73_m2} — ABNORMAL LOW (ref >59)
GFR calc non Af Amer: 7 mL/min/{1.73_m2} — ABNORMAL LOW (ref >59)
Globulin, Total: 2.7 g/dL (ref 1.5–4.5)
Glucose: 96 mg/dL (ref 65–99)
Potassium: 4.3 mmol/L (ref 3.5–5.2)
Sodium: 140 mmol/L (ref 134–144)
Total Protein: 6.6 g/dL (ref 6.0–8.5)

## 2019-01-24 LAB — CBC WITH DIFFERENTIAL/PLATELET
Basophils Absolute: 0.1 10*3/uL (ref 0.0–0.2)
Basos: 1 %
EOS (ABSOLUTE): 0.3 10*3/uL (ref 0.0–0.4)
Eos: 4 %
Hematocrit: 27.4 % — ABNORMAL LOW (ref 34.0–46.6)
Hemoglobin: 9.1 g/dL — ABNORMAL LOW (ref 11.1–15.9)
Immature Grans (Abs): 0 10*3/uL (ref 0.0–0.1)
Immature Granulocytes: 1 %
Lymphocytes Absolute: 1 10*3/uL (ref 0.7–3.1)
Lymphs: 12 %
MCH: 31 pg (ref 26.6–33.0)
MCHC: 33.2 g/dL (ref 31.5–35.7)
MCV: 93 fL (ref 79–97)
Monocytes Absolute: 0.8 10*3/uL (ref 0.1–0.9)
Monocytes: 9 %
Neutrophils Absolute: 6.5 10*3/uL (ref 1.4–7.0)
Neutrophils: 73 %
Platelets: 208 10*3/uL (ref 150–450)
RBC: 2.94 x10E6/uL — ABNORMAL LOW (ref 3.77–5.28)
RDW: 13.4 % (ref 11.7–15.4)
WBC: 8.7 10*3/uL (ref 3.4–10.8)

## 2019-01-25 LAB — URINE CULTURE

## 2019-01-27 DIAGNOSIS — N186 End stage renal disease: Secondary | ICD-10-CM | POA: Diagnosis not present

## 2019-01-27 DIAGNOSIS — Z992 Dependence on renal dialysis: Secondary | ICD-10-CM | POA: Diagnosis not present

## 2019-01-28 ENCOUNTER — Ambulatory Visit (INDEPENDENT_AMBULATORY_CARE_PROVIDER_SITE_OTHER): Payer: BC Managed Care – PPO | Admitting: Family Medicine

## 2019-01-28 ENCOUNTER — Encounter: Payer: Self-pay | Admitting: Family Medicine

## 2019-01-28 DIAGNOSIS — N898 Other specified noninflammatory disorders of vagina: Secondary | ICD-10-CM

## 2019-01-28 MED ORDER — FLUCONAZOLE 150 MG PO TABS: 150.0000 mg | ORAL_TABLET | Freq: Once | ORAL | 0 refills | Status: AC

## 2019-01-28 NOTE — Progress Notes (Signed)
Virtual Visit via Telephone Note  I connected with [name] on 01/28/19 at 3:04 PM by telephone and verified that I am speaking with the correct person using two identifiers. [name] is currently located at home and nobody is currently with her during this visit. The provider, Loman Brooklyn, FNP is located in their home at time of visit.  I discussed the limitations, risks, security and privacy concerns of performing an evaluation and management service by telephone and the availability of in person appointments. I also discussed with the patient that there may be a patient responsible charge related to this service. The patient expressed understanding and agreed to proceed.  Subjective: PCP: Sharion Balloon, FNP  Chief Complaint  Patient presents with  . Vaginal Itching   Patient complains of vaginal itching that started yesterday.  Denies vaginal discharge, swelling, abnormal bleeding, rash, lesions, and change in detergents/soaps/fabric softeners.  She does have some mild erythema and reports she feels sore where she is scratching.  Recently completed cefdinir for UTI.   ROS: Per HPI  Current Outpatient Medications:  .  acetaminophen (TYLENOL) 500 MG tablet, Take 500 mg by mouth every 6 (six) hours as needed for moderate pain or headache. , Disp: , Rfl:  .  albuterol (PROVENTIL HFA;VENTOLIN HFA) 108 (90 Base) MCG/ACT inhaler, Inhale 1-2 puffs into the lungs every 6 (six) hours as needed for wheezing or shortness of breath. (Patient not taking: Reported on 12/03/2018), Disp: 1 Inhaler, Rfl: 0 .  amLODipine (NORVASC) 5 MG tablet, Take 1 tablet by mouth nightly (Patient taking differently: Take 5 mg by mouth at bedtime. ), Disp: 90 tablet, Rfl: 0 .  aspirin (ASPIR-LOW) 81 MG EC tablet, Take 81 mg by mouth daily. , Disp: , Rfl:  .  B Complex-C-Folic Acid (RENA-VITE RX) 1 MG TABS, Take 1 tablet by mouth daily., Disp: , Rfl:  .  fluticasone (FLONASE) 50 MCG/ACT nasal spray,  Place 2 sprays into both nostrils daily. (Patient not taking: Reported on 12/03/2018), Disp: 16 g, Rfl: 6 .  insulin degludec (TRESIBA FLEXTOUCH) 100 UNIT/ML SOPN FlexTouch Pen, Inject 0.1 mLs (10 Units total) into the skin daily at 10 pm., Disp: 12 pen, Rfl: 2 .  isosorbide mononitrate (IMDUR) 60 MG 24 hr tablet, Take 1 tablet by mouth once daily (Patient taking differently: Take 60 mg by mouth daily. ), Disp: 90 tablet, Rfl: 0 .  lidocaine-prilocaine (EMLA) cream, Apply 1 application topically as needed (for dialysis). , Disp: , Rfl: 0 .  meclizine (ANTIVERT) 25 MG tablet, Take 1 tablet (25 mg total) by mouth 2 (two) times daily as needed for dizziness. (Patient not taking: Reported on 12/27/2018), Disp: 30 tablet, Rfl: 0 .  metoprolol tartrate (LOPRESSOR) 25 MG tablet, Take 1/2 (one-half) tablet by mouth twice daily (Patient not taking: No sig reported), Disp: 90 tablet, Rfl: 1 .  ondansetron (ZOFRAN ODT) 4 MG disintegrating tablet, 4mg  ODT q4 hours prn nausea/vomit (Patient not taking: Reported on 12/27/2018), Disp: 12 tablet, Rfl: 0 .  predniSONE (DELTASONE) 5 MG tablet, TAKE 1 TABLET BY MOUTH ONCE DAILY WITH BREAKFAST (Patient taking differently: Take 5 mg by mouth daily. ), Disp: 90 tablet, Rfl: 0 .  PROGRAF 0.5 MG capsule, Take 0.5 mg by mouth daily. Take 0.5mg  by mouth each morning., Disp: , Rfl: 0 .  rosuvastatin (CRESTOR) 20 MG tablet, Take 1 tablet by mouth once daily (Patient taking differently: Take 20 mg by mouth daily. ), Disp: 90 tablet,  Rfl: 1 .  sevelamer carbonate (RENVELA) 800 MG tablet, Take 800 mg by mouth See admin instructions. 4 tablets with meals(3 times daily) and 2 tablets with snacks(twice daily). Total of 16 tablets daily., Disp: , Rfl:  .  triamcinolone ointment (KENALOG) 0.5 %, Apply 1 application topically 2 (two) times daily. (Patient not taking: Reported on 12/27/2018), Disp: 60 g, Rfl: 0 .  TRULICITY 1.5 0000000 SOPN, INJECT 1 DOSE (1.5 MG) SUBCUTANEOUSLY ONCE A WEEK  (Patient taking differently: Inject 1.5 mg into the skin once a week. ), Disp: 12 mL, Rfl: 1  Allergies  Allergen Reactions  . Other Other (See Comments)    Tape Bruises and tears skin, Paper tape tolerated  . Tape Other (See Comments)    Bruises and tears skin Paper tape tolerated Bruises and tears skin Paper tape tolerated Bruises and tears skin Paper tape tolerated   Past Medical History:  Diagnosis Date  . Anemia    of chronic disease  . Blood transfusion without reported diagnosis   . Cardiovascular disease    nonobstructive  . Carotid artery stenosis 2008  . CHF (congestive heart failure) ([place])   . Coronary artery disease   . Diabetes mellitus   . Dyslipidemia   . Edema of lower extremity    with hypo-albuminemia and profound protenuria  . Heart murmur   . Hypertension   . Mitral regurgitation   . Nephrotic syndrome   . Patent foramen ovale   . Pulmonary hypertension, moderate to severe ([place])   . Pulmonary nodule   . Tricuspid regurgitation   . Volume depletion, renal, due to output loss (renal deficit)     Observations/Objective: A&O  No respiratory distress or wheezing audible over the phone Mood, judgement, and thought processes all WNL  Assessment and Plan: 1. Vaginal itching - fluconazole (DIFLUCAN) 150 MG tablet; Take 1 tablet (150 mg total) by mouth once for 1 dose.  Dispense: 1 tablet; Refill: 0   Follow Up Instructions:  I discussed the assessment and treatment plan with the patient. The patient was provided an opportunity to ask questions and all were answered. The patient agreed with the plan and demonstrated an understanding of the instructions.   The patient was advised to call back or seek an in-person evaluation if the symptoms worsen or if the condition fails to improve as anticipated.  The above assessment and management plan was discussed with the patient. The patient verbalized understanding of and has agreed to the management plan.  Patient is aware to call the clinic if symptoms persist or worsen. Patient is aware when to return to the clinic for a follow-up visit. Patient educated on when it is appropriate to go to the emergency department.   Time call ended: 3:09 PM  I provided 7 minutes of non-face-to-face time during this encounter.  Hendricks Limes, MSN, APRN, FNP-C [facility] 01/28/19

## 2019-01-29 DIAGNOSIS — Z992 Dependence on renal dialysis: Secondary | ICD-10-CM | POA: Diagnosis not present

## 2019-01-29 DIAGNOSIS — N186 End stage renal disease: Secondary | ICD-10-CM | POA: Diagnosis not present

## 2019-01-31 DIAGNOSIS — N186 End stage renal disease: Secondary | ICD-10-CM | POA: Diagnosis not present

## 2019-01-31 DIAGNOSIS — Z992 Dependence on renal dialysis: Secondary | ICD-10-CM | POA: Diagnosis not present

## 2019-02-03 DIAGNOSIS — N186 End stage renal disease: Secondary | ICD-10-CM | POA: Diagnosis not present

## 2019-02-03 DIAGNOSIS — Z992 Dependence on renal dialysis: Secondary | ICD-10-CM | POA: Diagnosis not present

## 2019-02-05 DIAGNOSIS — Z992 Dependence on renal dialysis: Secondary | ICD-10-CM | POA: Diagnosis not present

## 2019-02-05 DIAGNOSIS — N186 End stage renal disease: Secondary | ICD-10-CM | POA: Diagnosis not present

## 2019-02-07 DIAGNOSIS — Z992 Dependence on renal dialysis: Secondary | ICD-10-CM | POA: Diagnosis not present

## 2019-02-07 DIAGNOSIS — N186 End stage renal disease: Secondary | ICD-10-CM | POA: Diagnosis not present

## 2019-02-10 DIAGNOSIS — N186 End stage renal disease: Secondary | ICD-10-CM | POA: Diagnosis not present

## 2019-02-10 DIAGNOSIS — Z992 Dependence on renal dialysis: Secondary | ICD-10-CM | POA: Diagnosis not present

## 2019-02-12 DIAGNOSIS — N186 End stage renal disease: Secondary | ICD-10-CM | POA: Diagnosis not present

## 2019-02-12 DIAGNOSIS — Z992 Dependence on renal dialysis: Secondary | ICD-10-CM | POA: Diagnosis not present

## 2019-02-14 DIAGNOSIS — Z20828 Contact with and (suspected) exposure to other viral communicable diseases: Secondary | ICD-10-CM | POA: Diagnosis not present

## 2019-02-14 DIAGNOSIS — U071 COVID-19: Secondary | ICD-10-CM | POA: Diagnosis not present

## 2019-02-15 DIAGNOSIS — N186 End stage renal disease: Secondary | ICD-10-CM | POA: Diagnosis not present

## 2019-02-15 DIAGNOSIS — Z992 Dependence on renal dialysis: Secondary | ICD-10-CM | POA: Diagnosis not present

## 2019-02-18 DIAGNOSIS — Z992 Dependence on renal dialysis: Secondary | ICD-10-CM | POA: Diagnosis not present

## 2019-02-18 DIAGNOSIS — N186 End stage renal disease: Secondary | ICD-10-CM | POA: Diagnosis not present

## 2019-02-20 DIAGNOSIS — N186 End stage renal disease: Secondary | ICD-10-CM | POA: Diagnosis not present

## 2019-02-20 DIAGNOSIS — Z992 Dependence on renal dialysis: Secondary | ICD-10-CM | POA: Diagnosis not present

## 2019-02-22 DIAGNOSIS — N186 End stage renal disease: Secondary | ICD-10-CM | POA: Diagnosis not present

## 2019-02-22 DIAGNOSIS — Z992 Dependence on renal dialysis: Secondary | ICD-10-CM | POA: Diagnosis not present

## 2019-02-25 DIAGNOSIS — Z992 Dependence on renal dialysis: Secondary | ICD-10-CM | POA: Diagnosis not present

## 2019-02-25 DIAGNOSIS — N186 End stage renal disease: Secondary | ICD-10-CM | POA: Diagnosis not present

## 2019-02-26 DIAGNOSIS — N186 End stage renal disease: Secondary | ICD-10-CM | POA: Diagnosis not present

## 2019-02-26 DIAGNOSIS — Z992 Dependence on renal dialysis: Secondary | ICD-10-CM | POA: Diagnosis not present

## 2019-03-01 DIAGNOSIS — Z992 Dependence on renal dialysis: Secondary | ICD-10-CM | POA: Diagnosis not present

## 2019-03-01 DIAGNOSIS — N186 End stage renal disease: Secondary | ICD-10-CM | POA: Diagnosis not present

## 2019-03-03 DIAGNOSIS — Z992 Dependence on renal dialysis: Secondary | ICD-10-CM | POA: Diagnosis not present

## 2019-03-03 DIAGNOSIS — N186 End stage renal disease: Secondary | ICD-10-CM | POA: Diagnosis not present

## 2019-03-05 DIAGNOSIS — N186 End stage renal disease: Secondary | ICD-10-CM | POA: Diagnosis not present

## 2019-03-05 DIAGNOSIS — Z992 Dependence on renal dialysis: Secondary | ICD-10-CM | POA: Diagnosis not present

## 2019-03-06 DIAGNOSIS — N186 End stage renal disease: Secondary | ICD-10-CM | POA: Diagnosis not present

## 2019-03-06 DIAGNOSIS — Z992 Dependence on renal dialysis: Secondary | ICD-10-CM | POA: Diagnosis not present

## 2019-03-07 DIAGNOSIS — Z992 Dependence on renal dialysis: Secondary | ICD-10-CM | POA: Diagnosis not present

## 2019-03-07 DIAGNOSIS — N186 End stage renal disease: Secondary | ICD-10-CM | POA: Diagnosis not present

## 2019-03-10 DIAGNOSIS — N186 End stage renal disease: Secondary | ICD-10-CM | POA: Diagnosis not present

## 2019-03-10 DIAGNOSIS — Z992 Dependence on renal dialysis: Secondary | ICD-10-CM | POA: Diagnosis not present

## 2019-03-12 DIAGNOSIS — N186 End stage renal disease: Secondary | ICD-10-CM | POA: Diagnosis not present

## 2019-03-12 DIAGNOSIS — Z992 Dependence on renal dialysis: Secondary | ICD-10-CM | POA: Diagnosis not present

## 2019-03-14 DIAGNOSIS — N186 End stage renal disease: Secondary | ICD-10-CM | POA: Diagnosis not present

## 2019-03-14 DIAGNOSIS — Z992 Dependence on renal dialysis: Secondary | ICD-10-CM | POA: Diagnosis not present

## 2019-03-16 ENCOUNTER — Other Ambulatory Visit: Payer: Self-pay | Admitting: Family

## 2019-03-16 DIAGNOSIS — E1165 Type 2 diabetes mellitus with hyperglycemia: Secondary | ICD-10-CM

## 2019-03-16 DIAGNOSIS — Z794 Long term (current) use of insulin: Secondary | ICD-10-CM

## 2019-03-17 DIAGNOSIS — Z992 Dependence on renal dialysis: Secondary | ICD-10-CM | POA: Diagnosis not present

## 2019-03-17 DIAGNOSIS — N186 End stage renal disease: Secondary | ICD-10-CM | POA: Diagnosis not present

## 2019-03-19 DIAGNOSIS — Z992 Dependence on renal dialysis: Secondary | ICD-10-CM | POA: Diagnosis not present

## 2019-03-19 DIAGNOSIS — N186 End stage renal disease: Secondary | ICD-10-CM | POA: Diagnosis not present

## 2019-03-21 DIAGNOSIS — N186 End stage renal disease: Secondary | ICD-10-CM | POA: Diagnosis not present

## 2019-03-21 DIAGNOSIS — Z992 Dependence on renal dialysis: Secondary | ICD-10-CM | POA: Diagnosis not present

## 2019-03-24 ENCOUNTER — Ambulatory Visit (INDEPENDENT_AMBULATORY_CARE_PROVIDER_SITE_OTHER): Payer: BC Managed Care – PPO | Admitting: Nurse Practitioner

## 2019-03-24 ENCOUNTER — Encounter: Payer: Self-pay | Admitting: Nurse Practitioner

## 2019-03-24 DIAGNOSIS — R110 Nausea: Secondary | ICD-10-CM

## 2019-03-24 DIAGNOSIS — N186 End stage renal disease: Secondary | ICD-10-CM | POA: Diagnosis not present

## 2019-03-24 DIAGNOSIS — R519 Headache, unspecified: Secondary | ICD-10-CM | POA: Diagnosis not present

## 2019-03-24 DIAGNOSIS — Z992 Dependence on renal dialysis: Secondary | ICD-10-CM | POA: Diagnosis not present

## 2019-03-24 MED ORDER — ONDANSETRON HCL 4 MG PO TABS: 4.0000 mg | ORAL_TABLET | Freq: Three times a day (TID) | ORAL | 0 refills | Status: DC | PRN

## 2019-03-24 NOTE — Progress Notes (Signed)
   Virtual Visit via telephone Note Due to COVID-19 pandemic this visit was conducted virtually. This visit type was conducted due to national recommendations for restrictions regarding the COVID-19 Pandemic (e.g. social distancing, sheltering in place) in an effort to limit this patient's exposure and mitigate transmission in our community. All issues noted in this document were discussed and addressed.  A physical exam was not performed with this format.  I connected with [name] on 03/24/19 at 3:00 by telephone and verified that I am speaking with the correct person using two identifiers. [name] is currently located at home no one and  is currently with her during visit. The provider, Mary-Margaret Hassell Done, FNP is located in their office at time of visit.  I discussed the limitations, risks, security and privacy concerns of performing an evaluation and management service by telephone and the availability of in person appointments. I also discussed with the patient that there may be a patient responsible charge related to this service. The patient expressed understanding and agreed to proceed.   History and Present Illness:   Chief Complaint: Headache   HPI Patient calls in c/o headache the last several times she has taken dialysis. They usually gve her 2 tylenol and it will g away. Happened today and the tylenol has not helped. Her blood pressure ahs been high the last couple of weeks as she goes in for dialysis and would drop quickly and that is what causes the headache. Today the tylenol has not helped at all. The pain is just above her right eye. She rates it a 10/10 currently. Her vision is not bothering her right now. Slight nausea from headache.    Review of Systems  Eyes: Positive for pain (not eye itself- just above eye). Negative for blurred vision, double vision, photophobia, discharge and redness.  Gastrointestinal: Positive for nausea.  Genitourinary: Negative.     Neurological: Negative.   Psychiatric/Behavioral: Negative.   All other systems reviewed and are negative.    Observations/Objective: Alert and oriented- answers all questions appropriately No distress    Assessment and Plan: ABELLA EUCEDA in today with chief complaint of Headache   1. Nausea blnad diet rest - ondansetron (ZOFRAN) 4 MG tablet; Take 1 tablet (4 mg total) by mouth every 8 (eight) hours as needed for nausea or vomiting.  Dispense: 20 tablet; Refill: 0  2. Acute nonintractable headache, unspecified headache type Repeat tylenol around 4pm- took previous dose at 12pm Lay rest Cool conmpress.    Follow Up Instructions: Call office tomorrow if no better.    I discussed the assessment and treatment plan with the patient. The patient was provided an opportunity to ask questions and all were answered. The patient agreed with the plan and demonstrated an understanding of the instructions.   The patient was advised to call back or seek an in-person evaluation if the symptoms worsen or if the condition fails to improve as anticipated.  The above assessment and management plan was discussed with the patient. The patient verbalized understanding of and has agreed to the management plan. Patient is aware to call the clinic if symptoms persist or worsen. Patient is aware when to return to the clinic for a follow-up visit. Patient educated on when it is appropriate to go to the emergency department.   Time call ended:  3:15  I provided 15 minutes of non-face-to-face time during this encounter.    Mary-Margaret Hassell Done, FNP

## 2019-03-26 DIAGNOSIS — Z992 Dependence on renal dialysis: Secondary | ICD-10-CM | POA: Diagnosis not present

## 2019-03-26 DIAGNOSIS — N186 End stage renal disease: Secondary | ICD-10-CM | POA: Diagnosis not present

## 2019-03-28 ENCOUNTER — Telehealth: Payer: Self-pay | Admitting: Family

## 2019-03-28 DIAGNOSIS — E1165 Type 2 diabetes mellitus with hyperglycemia: Secondary | ICD-10-CM

## 2019-03-28 DIAGNOSIS — N186 End stage renal disease: Secondary | ICD-10-CM | POA: Diagnosis not present

## 2019-03-28 DIAGNOSIS — Z794 Long term (current) use of insulin: Secondary | ICD-10-CM

## 2019-03-28 DIAGNOSIS — Z992 Dependence on renal dialysis: Secondary | ICD-10-CM | POA: Diagnosis not present

## 2019-03-28 MED ORDER — TRULICITY 1.5 MG/0.5ML ~~LOC~~ SOAJ: SUBCUTANEOUS | 0 refills | Status: DC

## 2019-03-28 NOTE — Telephone Encounter (Signed)
Patient aware refill has been sent to the pharmacy and will need to be seen for any further refills

## 2019-03-28 NOTE — Telephone Encounter (Signed)
What is the name of the medication? trulicity  Have you contacted your pharmacy to request a refill? Yes  Which pharmacy would you like this sent to? [facility] [place] she is about to be out has appt with PCP on 04/03/2019   Patient notified that their request is being sent to the clinical staff for review and that they should receive a call once it is complete. If they do not receive a call within 24 hours they can check with their pharmacy or our office.

## 2019-03-31 DIAGNOSIS — N186 End stage renal disease: Secondary | ICD-10-CM | POA: Diagnosis not present

## 2019-03-31 DIAGNOSIS — Z992 Dependence on renal dialysis: Secondary | ICD-10-CM | POA: Diagnosis not present

## 2019-04-02 DIAGNOSIS — Z992 Dependence on renal dialysis: Secondary | ICD-10-CM | POA: Diagnosis not present

## 2019-04-02 DIAGNOSIS — N186 End stage renal disease: Secondary | ICD-10-CM | POA: Diagnosis not present

## 2019-04-03 ENCOUNTER — Ambulatory Visit (INDEPENDENT_AMBULATORY_CARE_PROVIDER_SITE_OTHER): Payer: BC Managed Care – PPO | Admitting: Family

## 2019-04-03 ENCOUNTER — Encounter: Payer: Self-pay | Admitting: Family

## 2019-04-03 DIAGNOSIS — I10 Essential (primary) hypertension: Secondary | ICD-10-CM

## 2019-04-03 DIAGNOSIS — I358 Other nonrheumatic aortic valve disorders: Secondary | ICD-10-CM | POA: Diagnosis not present

## 2019-04-03 DIAGNOSIS — I5032 Chronic diastolic (congestive) heart failure: Secondary | ICD-10-CM

## 2019-04-03 DIAGNOSIS — D638 Anemia in other chronic diseases classified elsewhere: Secondary | ICD-10-CM

## 2019-04-03 DIAGNOSIS — E785 Hyperlipidemia, unspecified: Secondary | ICD-10-CM

## 2019-04-03 DIAGNOSIS — E1169 Type 2 diabetes mellitus with other specified complication: Secondary | ICD-10-CM

## 2019-04-03 DIAGNOSIS — E1165 Type 2 diabetes mellitus with hyperglycemia: Secondary | ICD-10-CM

## 2019-04-03 DIAGNOSIS — N186 End stage renal disease: Secondary | ICD-10-CM

## 2019-04-03 DIAGNOSIS — Z992 Dependence on renal dialysis: Secondary | ICD-10-CM

## 2019-04-03 DIAGNOSIS — Z794 Long term (current) use of insulin: Secondary | ICD-10-CM

## 2019-04-03 MED ORDER — TRULICITY 1.5 MG/0.5ML ~~LOC~~ SOAJ: SUBCUTANEOUS | 0 refills | Status: DC

## 2019-04-03 NOTE — Progress Notes (Signed)
Virtual Visit via telephone Note Due to COVID-19 pandemic this visit was conducted virtually. This visit type was conducted due to national recommendations for restrictions regarding the COVID-19 Pandemic (e.g. social distancing, sheltering in place) in an effort to limit this patient's exposure and mitigate transmission in our community. All issues noted in this document were discussed and addressed.  A physical exam was not performed with this format.  I connected with [name] on 04/03/19 at 9:45 AM  by telephone and verified that I am speaking with the correct person using two identifiers. [name] is currently located at home and no one is currently with her during visit. The provider, Evelina Dun, FNP is located in their office at time of visit.  I discussed the limitations, risks, security and privacy concerns of performing an evaluation and management service by telephone and the availability of in person appointments. I also discussed with the patient that there may be a patient responsible charge related to this service. The patient expressed understanding and agreed to proceed.   History and Present Illness:  PT presents to the office todayfor chronic follow up.Pt is followed by Cardiologists annually for CHF and aortic valve sclerosis that is stable.   Pt currently on dialysis Monday, Wednesday, and Friday and followed by nephrologists there. 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 She has type 2 diabetes mellitus. Her disease course has been stable. There are no hypoglycemic associated symptoms. Associated symptoms include fatigue and foot paresthesias. Pertinent negatives for diabetes include no blurred vision. Symptoms are stable. Diabetic complications include heart disease, nephropathy and peripheral neuropathy.  Risk factors for coronary artery disease include dyslipidemia, diabetes mellitus, hypertension, sedentary lifestyle and post-menopausal. She is following a generally healthy diet. Her overall blood glucose range is 130-140 mg/dl. An ACE inhibitor/angiotensin II receptor blocker is contraindicated. Eye exam is not current.  Hypertension This is a chronic problem. The current episode started more than 1 year ago. The problem has been waxing and waning since onset. Associated symptoms include malaise/fatigue. Pertinent negatives include no blurred vision, peripheral edema or shortness of breath. Risk factors for coronary artery disease include dyslipidemia, diabetes mellitus and sedentary lifestyle. The current treatment provides moderate improvement. Hypertensive end-organ damage includes kidney disease and heart failure.  Hyperlipidemia This is a chronic problem. The current episode started more than 1 year ago. The problem is uncontrolled. Pertinent negatives include no shortness of breath. Current antihyperlipidemic treatment includes statins. The current treatment provides moderate improvement of lipids.  Anemia Presents for follow-up visit. Symptoms include malaise/fatigue. There has been no bruising/bleeding easily. Past medical history includes heart failure.  Congestive Heart Failure Presents for follow-up visit. Associated symptoms include fatigue. Pertinent negatives include no edema or shortness of breath. The symptoms have been stable.      Review of Systems  Constitutional: Positive for fatigue and malaise/fatigue.  Eyes: Negative for blurred vision.  Respiratory: Negative for shortness of breath.   Endo/Heme/Allergies: Does not bruise/bleed easily.  All other systems reviewed and are negative.    Observations/Objective: No SOB or distress noted  Assessment and Plan: [name] comes in today with chief complaint of No chief complaint on file.   Diagnosis and orders  addressed:  1. Aortic valve sclerosis  2. Chronic diastolic heart failure ([place])  3. Essential  hypertension  4. Hyperlipidemia associated with type 2 diabetes mellitus ([employer])  5. Type 2 diabetes mellitus with other specified complication, without long-term current use of insulin ([place])  6. Chronic kidney disease on chronic dialysis ([place])  7. ESRD (end stage renal disease) ([place])  8. Anemia of chronic disease   Pt had labs drawn two days ago, will get and scan into chart Health Maintenance reviewed Diet and exercise encouraged  Follow up plan: 3 months and keep Cardiologists appointment next week       I discussed the assessment and treatment plan with the patient. The patient was provided an opportunity to ask questions and all were answered. The patient agreed with the plan and demonstrated an understanding of the instructions.   The patient was advised to call back or seek an in-person evaluation if the symptoms worsen or if the condition fails to improve as anticipated.  The above assessment and management plan was discussed with the patient. The patient verbalized understanding of and has agreed to the management plan. Patient is aware to call the clinic if symptoms persist or worsen. Patient is aware when to return to the clinic for a follow-up visit. Patient educated on when it is appropriate to go to the emergency department.   Time call ended:  10:04 AM  I provided 19 minutes of non-face-to-face time during this encounter.    Evelina Dun, FNP

## 2019-04-04 DIAGNOSIS — N186 End stage renal disease: Secondary | ICD-10-CM | POA: Diagnosis not present

## 2019-04-04 DIAGNOSIS — Z992 Dependence on renal dialysis: Secondary | ICD-10-CM | POA: Diagnosis not present

## 2019-04-06 DIAGNOSIS — Z992 Dependence on renal dialysis: Secondary | ICD-10-CM | POA: Diagnosis not present

## 2019-04-06 DIAGNOSIS — N186 End stage renal disease: Secondary | ICD-10-CM | POA: Diagnosis not present

## 2019-04-07 DIAGNOSIS — Z992 Dependence on renal dialysis: Secondary | ICD-10-CM | POA: Diagnosis not present

## 2019-04-07 DIAGNOSIS — N186 End stage renal disease: Secondary | ICD-10-CM | POA: Diagnosis not present

## 2019-04-09 DIAGNOSIS — N186 End stage renal disease: Secondary | ICD-10-CM | POA: Diagnosis not present

## 2019-04-09 DIAGNOSIS — Z992 Dependence on renal dialysis: Secondary | ICD-10-CM | POA: Diagnosis not present

## 2019-04-11 DIAGNOSIS — N186 End stage renal disease: Secondary | ICD-10-CM | POA: Diagnosis not present

## 2019-04-11 DIAGNOSIS — Z992 Dependence on renal dialysis: Secondary | ICD-10-CM | POA: Diagnosis not present

## 2019-04-14 DIAGNOSIS — N186 End stage renal disease: Secondary | ICD-10-CM | POA: Diagnosis not present

## 2019-04-14 DIAGNOSIS — Z992 Dependence on renal dialysis: Secondary | ICD-10-CM | POA: Diagnosis not present

## 2019-04-15 DIAGNOSIS — I10 Essential (primary) hypertension: Secondary | ICD-10-CM | POA: Diagnosis not present

## 2019-04-16 DIAGNOSIS — N186 End stage renal disease: Secondary | ICD-10-CM | POA: Diagnosis not present

## 2019-04-16 DIAGNOSIS — Z992 Dependence on renal dialysis: Secondary | ICD-10-CM | POA: Diagnosis not present

## 2019-04-18 DIAGNOSIS — N186 End stage renal disease: Secondary | ICD-10-CM | POA: Diagnosis not present

## 2019-04-18 DIAGNOSIS — Z992 Dependence on renal dialysis: Secondary | ICD-10-CM | POA: Diagnosis not present

## 2019-04-21 DIAGNOSIS — N186 End stage renal disease: Secondary | ICD-10-CM | POA: Diagnosis not present

## 2019-04-21 DIAGNOSIS — Z992 Dependence on renal dialysis: Secondary | ICD-10-CM | POA: Diagnosis not present

## 2019-04-22 ENCOUNTER — Ambulatory Visit: Payer: BC Managed Care – PPO | Admitting: Nurse Practitioner

## 2019-04-22 ENCOUNTER — Encounter: Payer: Self-pay | Admitting: Nurse Practitioner

## 2019-04-22 ENCOUNTER — Other Ambulatory Visit: Payer: Self-pay

## 2019-04-22 VITALS — BP 111/51 | HR 59 | Temp 98.2°F | Ht 69.0 in | Wt 153.0 lb

## 2019-04-22 DIAGNOSIS — N898 Other specified noninflammatory disorders of vagina: Secondary | ICD-10-CM

## 2019-04-22 LAB — WET PREP FOR TRICH, YEAST, CLUE
Clue Cell Exam: NEGATIVE
Trichomonas Exam: NEGATIVE
Yeast Exam: NEGATIVE

## 2019-04-22 NOTE — Progress Notes (Signed)
   Subjective:    Patient ID: [name], female    DOB: [birth year], 64 y.o.   MRN: [id number]   Chief Complaint: Vaginal Discharge   HPI Patient is brought in by her daughter today C/O vaginal discharge. Noticed it about 4 days ago. Has a white discharge slight odor that she could not describe. Denies itching. No new sexual partners. She is no longer sexually active.   * she is on dialysis and does not void much. Has dialysis tomorrow.  Review of Systems  Constitutional: Negative.   Respiratory: Negative.   Cardiovascular: Negative.   Genitourinary: Positive for flank pain and vaginal discharge. Negative for vaginal bleeding and vaginal pain.  All other systems reviewed and are negative.      Objective:   Physical Exam Vitals and nursing note reviewed.  Constitutional:      Appearance: Normal appearance.  Cardiovascular:     Rate and Rhythm: Normal rate and regular rhythm.     Heart sounds: Normal heart sounds.  Skin:    General: Skin is warm.     Capillary Refill: Capillary refill takes less than 2 seconds.  Neurological:     General: No focal deficit present.     Mental Status: She is alert and oriented to person, place, and time.  Psychiatric:        Mood and Affect: Mood normal.        Behavior: Behavior normal.    BP (!) 111/51   Pulse (!) 59   Temp 98.2 F (36.8 C) (Temporal)   Ht 5\' 9"  (1.753 m)   Wt 153 lb (69.4 kg)   SpO2 96%   BMI 22.59 kg/m         Assessment & Plan:  Rockey Situ in today with chief complaint of Vaginal Discharge   1. Vaginal discharge All clear Do 1 vinegar douche OTC Avoid bubble baths Patient was given urine cup and hat to void in to try to get urine specimen. RTO prn - WET PREP FOR TRICH, YEAST, CLUE    The above assessment and management plan was discussed with the patient. The patient verbalized understanding of and has agreed to the management plan. Patient is aware to call the clinic if symptoms persist or  worsen. Patient is aware when to return to the clinic for a follow-up visit. Patient educated on when it is appropriate to go to the emergency department.   Mary-Margaret Hassell Done, FNP

## 2019-04-23 DIAGNOSIS — N186 End stage renal disease: Secondary | ICD-10-CM | POA: Diagnosis not present

## 2019-04-23 DIAGNOSIS — Z992 Dependence on renal dialysis: Secondary | ICD-10-CM | POA: Diagnosis not present

## 2019-04-25 DIAGNOSIS — N186 End stage renal disease: Secondary | ICD-10-CM | POA: Diagnosis not present

## 2019-04-25 DIAGNOSIS — Z992 Dependence on renal dialysis: Secondary | ICD-10-CM | POA: Diagnosis not present

## 2019-04-28 DIAGNOSIS — N186 End stage renal disease: Secondary | ICD-10-CM | POA: Diagnosis not present

## 2019-04-28 DIAGNOSIS — Z992 Dependence on renal dialysis: Secondary | ICD-10-CM | POA: Diagnosis not present

## 2019-04-30 DIAGNOSIS — Z992 Dependence on renal dialysis: Secondary | ICD-10-CM | POA: Diagnosis not present

## 2019-04-30 DIAGNOSIS — N186 End stage renal disease: Secondary | ICD-10-CM | POA: Diagnosis not present

## 2019-05-02 DIAGNOSIS — N186 End stage renal disease: Secondary | ICD-10-CM | POA: Diagnosis not present

## 2019-05-02 DIAGNOSIS — Z992 Dependence on renal dialysis: Secondary | ICD-10-CM | POA: Diagnosis not present

## 2019-05-04 DIAGNOSIS — Z992 Dependence on renal dialysis: Secondary | ICD-10-CM | POA: Diagnosis not present

## 2019-05-04 DIAGNOSIS — N186 End stage renal disease: Secondary | ICD-10-CM | POA: Diagnosis not present

## 2019-05-05 DIAGNOSIS — Z992 Dependence on renal dialysis: Secondary | ICD-10-CM | POA: Diagnosis not present

## 2019-05-05 DIAGNOSIS — N186 End stage renal disease: Secondary | ICD-10-CM | POA: Diagnosis not present

## 2019-05-07 DIAGNOSIS — Z992 Dependence on renal dialysis: Secondary | ICD-10-CM | POA: Diagnosis not present

## 2019-05-07 DIAGNOSIS — N186 End stage renal disease: Secondary | ICD-10-CM | POA: Diagnosis not present

## 2019-05-09 DIAGNOSIS — N186 End stage renal disease: Secondary | ICD-10-CM | POA: Diagnosis not present

## 2019-05-09 DIAGNOSIS — Z992 Dependence on renal dialysis: Secondary | ICD-10-CM | POA: Diagnosis not present

## 2019-05-12 DIAGNOSIS — N186 End stage renal disease: Secondary | ICD-10-CM | POA: Diagnosis not present

## 2019-05-12 DIAGNOSIS — Z992 Dependence on renal dialysis: Secondary | ICD-10-CM | POA: Diagnosis not present

## 2019-05-14 DIAGNOSIS — N186 End stage renal disease: Secondary | ICD-10-CM | POA: Diagnosis not present

## 2019-05-14 DIAGNOSIS — Z992 Dependence on renal dialysis: Secondary | ICD-10-CM | POA: Diagnosis not present

## 2019-05-16 DIAGNOSIS — N186 End stage renal disease: Secondary | ICD-10-CM | POA: Diagnosis not present

## 2019-05-16 DIAGNOSIS — Z992 Dependence on renal dialysis: Secondary | ICD-10-CM | POA: Diagnosis not present

## 2019-05-19 DIAGNOSIS — Z992 Dependence on renal dialysis: Secondary | ICD-10-CM | POA: Diagnosis not present

## 2019-05-19 DIAGNOSIS — N186 End stage renal disease: Secondary | ICD-10-CM | POA: Diagnosis not present

## 2019-05-21 DIAGNOSIS — N186 End stage renal disease: Secondary | ICD-10-CM | POA: Diagnosis not present

## 2019-05-21 DIAGNOSIS — Z992 Dependence on renal dialysis: Secondary | ICD-10-CM | POA: Diagnosis not present

## 2019-05-23 DIAGNOSIS — N186 End stage renal disease: Secondary | ICD-10-CM | POA: Diagnosis not present

## 2019-05-23 DIAGNOSIS — Z992 Dependence on renal dialysis: Secondary | ICD-10-CM | POA: Diagnosis not present

## 2019-05-25 ENCOUNTER — Emergency Department (HOSPITAL_COMMUNITY): Payer: BC Managed Care – PPO

## 2019-05-25 ENCOUNTER — Other Ambulatory Visit: Payer: Self-pay

## 2019-05-25 ENCOUNTER — Encounter (HOSPITAL_COMMUNITY): Payer: Self-pay | Admitting: Emergency Medicine

## 2019-05-25 ENCOUNTER — Inpatient Hospital Stay (HOSPITAL_COMMUNITY)
Admission: EM | Admit: 2019-05-25 | Discharge: 2019-05-27 | DRG: 640 | Disposition: A | Discharge status: Home / Self Care | Payer: BC Managed Care – PPO | Attending: Family Medicine | Admitting: Family Medicine

## 2019-05-25 DIAGNOSIS — I509 Heart failure, unspecified: Secondary | ICD-10-CM | POA: Diagnosis not present

## 2019-05-25 DIAGNOSIS — A419 Sepsis, unspecified organism: Secondary | ICD-10-CM

## 2019-05-25 DIAGNOSIS — R509 Fever, unspecified: Secondary | ICD-10-CM | POA: Diagnosis not present

## 2019-05-25 DIAGNOSIS — E1165 Type 2 diabetes mellitus with hyperglycemia: Secondary | ICD-10-CM | POA: Diagnosis not present

## 2019-05-25 DIAGNOSIS — R9389 Abnormal findings on diagnostic imaging of other specified body structures: Secondary | ICD-10-CM

## 2019-05-25 DIAGNOSIS — E1169 Type 2 diabetes mellitus with other specified complication: Secondary | ICD-10-CM | POA: Diagnosis not present

## 2019-05-25 DIAGNOSIS — D849 Immunodeficiency, unspecified: Secondary | ICD-10-CM | POA: Diagnosis not present

## 2019-05-25 DIAGNOSIS — N2581 Secondary hyperparathyroidism of renal origin: Secondary | ICD-10-CM | POA: Diagnosis not present

## 2019-05-25 DIAGNOSIS — J189 Pneumonia, unspecified organism: Secondary | ICD-10-CM | POA: Diagnosis not present

## 2019-05-25 DIAGNOSIS — E119 Type 2 diabetes mellitus without complications: Secondary | ICD-10-CM

## 2019-05-25 DIAGNOSIS — I5042 Chronic combined systolic (congestive) and diastolic (congestive) heart failure: Secondary | ICD-10-CM | POA: Diagnosis present

## 2019-05-25 DIAGNOSIS — E8889 Other specified metabolic disorders: Secondary | ICD-10-CM | POA: Diagnosis present

## 2019-05-25 DIAGNOSIS — E1122 Type 2 diabetes mellitus with diabetic chronic kidney disease: Secondary | ICD-10-CM | POA: Diagnosis not present

## 2019-05-25 DIAGNOSIS — N186 End stage renal disease: Secondary | ICD-10-CM | POA: Diagnosis not present

## 2019-05-25 DIAGNOSIS — I10 Essential (primary) hypertension: Secondary | ICD-10-CM | POA: Diagnosis not present

## 2019-05-25 DIAGNOSIS — N390 Urinary tract infection, site not specified: Secondary | ICD-10-CM | POA: Diagnosis not present

## 2019-05-25 DIAGNOSIS — Z940 Kidney transplant status: Secondary | ICD-10-CM

## 2019-05-25 DIAGNOSIS — E875 Hyperkalemia: Secondary | ICD-10-CM | POA: Diagnosis not present

## 2019-05-25 DIAGNOSIS — Z794 Long term (current) use of insulin: Secondary | ICD-10-CM | POA: Diagnosis not present

## 2019-05-25 DIAGNOSIS — Z8249 Family history of ischemic heart disease and other diseases of the circulatory system: Secondary | ICD-10-CM | POA: Diagnosis not present

## 2019-05-25 DIAGNOSIS — D631 Anemia in chronic kidney disease: Secondary | ICD-10-CM | POA: Diagnosis present

## 2019-05-25 DIAGNOSIS — Z992 Dependence on renal dialysis: Secondary | ICD-10-CM

## 2019-05-25 DIAGNOSIS — Z833 Family history of diabetes mellitus: Secondary | ICD-10-CM | POA: Diagnosis not present

## 2019-05-25 DIAGNOSIS — Z7982 Long term (current) use of aspirin: Secondary | ICD-10-CM

## 2019-05-25 DIAGNOSIS — Z20822 Contact with and (suspected) exposure to covid-19: Secondary | ICD-10-CM | POA: Diagnosis present

## 2019-05-25 DIAGNOSIS — I132 Hypertensive heart and chronic kidney disease with heart failure and with stage 5 chronic kidney disease, or end stage renal disease: Secondary | ICD-10-CM | POA: Diagnosis not present

## 2019-05-25 DIAGNOSIS — I5032 Chronic diastolic (congestive) heart failure: Secondary | ICD-10-CM | POA: Diagnosis not present

## 2019-05-25 DIAGNOSIS — I5082 Biventricular heart failure: Secondary | ICD-10-CM | POA: Diagnosis present

## 2019-05-25 DIAGNOSIS — I4581 Long QT syndrome: Secondary | ICD-10-CM | POA: Diagnosis not present

## 2019-05-25 DIAGNOSIS — E785 Hyperlipidemia, unspecified: Secondary | ICD-10-CM | POA: Diagnosis not present

## 2019-05-25 DIAGNOSIS — R9431 Abnormal electrocardiogram [ECG] [EKG]: Secondary | ICD-10-CM

## 2019-05-25 DIAGNOSIS — Z7952 Long term (current) use of systemic steroids: Secondary | ICD-10-CM

## 2019-05-25 DIAGNOSIS — Z841 Family history of disorders of kidney and ureter: Secondary | ICD-10-CM

## 2019-05-25 DIAGNOSIS — R110 Nausea: Secondary | ICD-10-CM

## 2019-05-25 DIAGNOSIS — R112 Nausea with vomiting, unspecified: Secondary | ICD-10-CM | POA: Diagnosis present

## 2019-05-25 DIAGNOSIS — I2510 Atherosclerotic heart disease of native coronary artery without angina pectoris: Secondary | ICD-10-CM | POA: Diagnosis not present

## 2019-05-25 DIAGNOSIS — Z79899 Other long term (current) drug therapy: Secondary | ICD-10-CM

## 2019-05-25 DIAGNOSIS — T8611 Kidney transplant rejection: Secondary | ICD-10-CM | POA: Diagnosis not present

## 2019-05-25 HISTORY — DX: Abnormal findings on diagnostic imaging of other specified body structures: R93.89

## 2019-05-25 LAB — LIPASE, BLOOD: Lipase: 40 U/L (ref 11–51)

## 2019-05-25 LAB — CBC WITH DIFFERENTIAL/PLATELET
Abs Immature Granulocytes: 0.03 10*3/uL (ref 0.00–0.07)
Basophils Absolute: 0.1 10*3/uL (ref 0.0–0.1)
Basophils Relative: 1 %
Eosinophils Absolute: 0.3 10*3/uL (ref 0.0–0.5)
Eosinophils Relative: 3 %
HCT: 41.8 % (ref 36.0–46.0)
Hemoglobin: 12.6 g/dL (ref 12.0–15.0)
Immature Granulocytes: 0 %
Lymphocytes Relative: 6 %
Lymphs Abs: 0.6 10*3/uL — ABNORMAL LOW (ref 0.7–4.0)
MCH: 29.4 pg (ref 26.0–34.0)
MCHC: 30.1 g/dL (ref 30.0–36.0)
MCV: 97.4 fL (ref 80.0–100.0)
Monocytes Absolute: 0.7 10*3/uL (ref 0.1–1.0)
Monocytes Relative: 8 %
Neutro Abs: 7.5 10*3/uL (ref 1.7–7.7)
Neutrophils Relative %: 82 %
Platelets: 163 10*3/uL (ref 150–400)
RBC: 4.29 MIL/uL (ref 3.87–5.11)
RDW: 14.6 % (ref 11.5–15.5)
WBC: 9.1 10*3/uL (ref 4.0–10.5)
nRBC: 0 % (ref 0.0–0.2)

## 2019-05-25 LAB — COMPREHENSIVE METABOLIC PANEL
ALT: 35 U/L (ref 0–44)
AST: 44 U/L — ABNORMAL HIGH (ref 15–41)
Albumin: 3.5 g/dL (ref 3.5–5.0)
Alkaline Phosphatase: 100 U/L (ref 38–126)
Anion gap: 16 — ABNORMAL HIGH (ref 5–15)
BUN: 48 mg/dL — ABNORMAL HIGH (ref 8–23)
CO2: 26 mmol/L (ref 22–32)
Calcium: 9.2 mg/dL (ref 8.9–10.3)
Chloride: 94 mmol/L — ABNORMAL LOW (ref 98–111)
Creatinine, Ser: 8.77 mg/dL — ABNORMAL HIGH (ref 0.44–1.00)
GFR calc Af Amer: 5 mL/min — ABNORMAL LOW (ref >60)
GFR calc non Af Amer: 4 mL/min — ABNORMAL LOW (ref >60)
Glucose, Bld: 200 mg/dL — ABNORMAL HIGH (ref 70–99)
Potassium: 5.6 mmol/L — ABNORMAL HIGH (ref 3.5–5.1)
Sodium: 136 mmol/L (ref 135–145)
Total Bilirubin: 0.9 mg/dL (ref 0.3–1.2)
Total Protein: 8 g/dL (ref 6.5–8.1)

## 2019-05-25 LAB — LACTIC ACID, PLASMA
Lactic Acid, Venous: 2 mmol/L (ref 0.5–1.9)
Lactic Acid, Venous: 2 mmol/L (ref 0.5–1.9)

## 2019-05-25 LAB — GLUCOSE, CAPILLARY
Glucose-Capillary: 213 mg/dL — ABNORMAL HIGH (ref 70–99)
Glucose-Capillary: 256 mg/dL — ABNORMAL HIGH (ref 70–99)

## 2019-05-25 LAB — BRAIN NATRIURETIC PEPTIDE: B Natriuretic Peptide: 3037 pg/mL — ABNORMAL HIGH (ref 0.0–100.0)

## 2019-05-25 LAB — MRSA PCR SCREENING: MRSA by PCR: NEGATIVE

## 2019-05-25 LAB — CBG MONITORING, ED: Glucose-Capillary: 173 mg/dL — ABNORMAL HIGH (ref 70–99)

## 2019-05-25 LAB — APTT: aPTT: 30 seconds (ref 24–36)

## 2019-05-25 LAB — PROCALCITONIN: Procalcitonin: 5.3 ng/mL

## 2019-05-25 LAB — PROTIME-INR
INR: 1.1 (ref 0.8–1.2)
Prothrombin Time: 14.5 seconds (ref 11.4–15.2)

## 2019-05-25 MED ORDER — SEVELAMER CARBONATE 800 MG PO TABS
3200.0000 mg | ORAL_TABLET | Freq: Three times a day (TID) | ORAL | Status: DC
  Administered 2019-05-25 – 2019-05-27 (×5): 3200 mg via ORAL
  Filled 2019-05-25 (×6): qty 4

## 2019-05-25 MED ORDER — CLONIDINE HCL 0.1 MG PO TABS
0.1000 mg | ORAL_TABLET | Freq: Every day | ORAL | Status: DC
  Administered 2019-05-25 – 2019-05-27 (×3): 0.1 mg via ORAL
  Filled 2019-05-25 (×3): qty 1

## 2019-05-25 MED ORDER — HEPARIN SODIUM (PORCINE) 5000 UNIT/ML IJ SOLN
5000.0000 [IU] | Freq: Three times a day (TID) | INTRAMUSCULAR | Status: DC
  Administered 2019-05-25 – 2019-05-27 (×6): 5000 [IU] via SUBCUTANEOUS
  Filled 2019-05-25 (×6): qty 1

## 2019-05-25 MED ORDER — ISOSORBIDE MONONITRATE ER 60 MG PO TB24
60.0000 mg | ORAL_TABLET | Freq: Every day | ORAL | Status: DC
  Administered 2019-05-25 – 2019-05-27 (×3): 60 mg via ORAL
  Filled 2019-05-25 (×3): qty 1

## 2019-05-25 MED ORDER — ONDANSETRON HCL 4 MG/2ML IJ SOLN
INTRAMUSCULAR | Status: AC
  Filled 2019-05-25: qty 2

## 2019-05-25 MED ORDER — PREDNISONE 10 MG PO TABS
5.0000 mg | ORAL_TABLET | Freq: Every day | ORAL | Status: DC
  Administered 2019-05-25 – 2019-05-27 (×3): 5 mg via ORAL
  Filled 2019-05-25 (×3): qty 1

## 2019-05-25 MED ORDER — ACETAMINOPHEN 650 MG RE SUPP: 650.0000 mg | Freq: Four times a day (QID) | RECTAL | Status: DC | PRN

## 2019-05-25 MED ORDER — ACETAMINOPHEN 325 MG PO TABS
650.0000 mg | ORAL_TABLET | Freq: Four times a day (QID) | ORAL | Status: DC | PRN
  Administered 2019-05-26: 650 mg via ORAL
  Filled 2019-05-25: qty 2

## 2019-05-25 MED ORDER — RENA-VITE PO TABS
1.0000 | ORAL_TABLET | Freq: Every day | ORAL | Status: DC
  Administered 2019-05-25 – 2019-05-27 (×3): 1 via ORAL
  Filled 2019-05-25 (×3): qty 1

## 2019-05-25 MED ORDER — SODIUM CHLORIDE 0.9 % IV SOLN
1.0000 g | INTRAVENOUS | Status: DC
  Administered 2019-05-26: 1 g via INTRAVENOUS
  Filled 2019-05-25 (×2): qty 1

## 2019-05-25 MED ORDER — ROSUVASTATIN CALCIUM 20 MG PO TABS
20.0000 mg | ORAL_TABLET | Freq: Every day | ORAL | Status: DC
  Administered 2019-05-25 – 2019-05-27 (×3): 20 mg via ORAL
  Filled 2019-05-25 (×3): qty 1

## 2019-05-25 MED ORDER — VANCOMYCIN HCL IN DEXTROSE 750-5 MG/150ML-% IV SOLN
750.0000 mg | INTRAVENOUS | Status: DC
  Administered 2019-05-26: 750 mg via INTRAVENOUS
  Filled 2019-05-25 (×3): qty 150

## 2019-05-25 MED ORDER — INSULIN DEGLUDEC 100 UNIT/ML ~~LOC~~ SOPN: 10.0000 [IU] | PEN_INJECTOR | Freq: Every day | SUBCUTANEOUS | Status: DC

## 2019-05-25 MED ORDER — ASPIRIN EC 81 MG PO TBEC
81.0000 mg | DELAYED_RELEASE_TABLET | Freq: Every day | ORAL | Status: DC
  Administered 2019-05-25 – 2019-05-27 (×3): 81 mg via ORAL
  Filled 2019-05-25 (×3): qty 1

## 2019-05-25 MED ORDER — SEVELAMER CARBONATE 800 MG PO TABS: 1600.0000 mg | ORAL_TABLET | Freq: Every day | ORAL | Status: DC

## 2019-05-25 MED ORDER — INSULIN ASPART 100 UNIT/ML ~~LOC~~ SOLN
0.0000 [IU] | Freq: Three times a day (TID) | SUBCUTANEOUS | Status: DC
  Administered 2019-05-25 – 2019-05-26 (×2): 3 [IU] via SUBCUTANEOUS
  Administered 2019-05-26: 1 [IU] via SUBCUTANEOUS
  Administered 2019-05-26 – 2019-05-27 (×2): 2 [IU] via SUBCUTANEOUS

## 2019-05-25 MED ORDER — SENNA 8.6 MG PO TABS
1.0000 | ORAL_TABLET | Freq: Two times a day (BID) | ORAL | Status: DC
  Administered 2019-05-25 – 2019-05-27 (×5): 8.6 mg via ORAL
  Filled 2019-05-25 (×8): qty 1

## 2019-05-25 MED ORDER — ONDANSETRON HCL 4 MG/2ML IJ SOLN
4.0000 mg | Freq: Once | INTRAMUSCULAR | Status: AC
  Administered 2019-05-25: 12:00:00 4 mg via INTRAVENOUS

## 2019-05-25 MED ORDER — METRONIDAZOLE IN NACL 5-0.79 MG/ML-% IV SOLN
500.0000 mg | Freq: Once | INTRAVENOUS | Status: AC
  Administered 2019-05-25: 500 mg via INTRAVENOUS
  Filled 2019-05-25: qty 100

## 2019-05-25 MED ORDER — ENOXAPARIN SODIUM 30 MG/0.3ML ~~LOC~~ SOLN: 30.0000 mg | SUBCUTANEOUS | Status: DC

## 2019-05-25 MED ORDER — TACROLIMUS 0.5 MG PO CAPS
0.5000 mg | ORAL_CAPSULE | Freq: Every day | ORAL | Status: DC
  Administered 2019-05-25 – 2019-05-27 (×3): 0.5 mg via ORAL
  Filled 2019-05-25 (×3): qty 1

## 2019-05-25 MED ORDER — INSULIN DETEMIR 100 UNIT/ML ~~LOC~~ SOLN
10.0000 [IU] | Freq: Every day | SUBCUTANEOUS | Status: DC
  Administered 2019-05-25 – 2019-05-26 (×2): 10 [IU] via SUBCUTANEOUS
  Filled 2019-05-25 (×3): qty 0.1

## 2019-05-25 MED ORDER — SEVELAMER CARBONATE 800 MG PO TABS
1600.0000 mg | ORAL_TABLET | Freq: Two times a day (BID) | ORAL | Status: DC
  Administered 2019-05-26 (×2): 1600 mg via ORAL
  Filled 2019-05-25 (×2): qty 2

## 2019-05-25 MED ORDER — CHLORHEXIDINE GLUCONATE CLOTH 2 % EX PADS
6.0000 | MEDICATED_PAD | Freq: Every day | CUTANEOUS | Status: DC
  Administered 2019-05-26 – 2019-05-27 (×2): 6 via TOPICAL

## 2019-05-25 MED ORDER — METOPROLOL SUCCINATE ER 25 MG PO TB24
25.0000 mg | ORAL_TABLET | Freq: Every day | ORAL | Status: DC
  Administered 2019-05-25 – 2019-05-27 (×3): 25 mg via ORAL
  Filled 2019-05-25 (×3): qty 1

## 2019-05-25 MED ORDER — LACTATED RINGERS IV BOLUS (SEPSIS)
500.0000 mL | Freq: Once | INTRAVENOUS | Status: AC
  Administered 2019-05-25: 500 mL via INTRAVENOUS

## 2019-05-25 MED ORDER — SODIUM CHLORIDE 0.45 % IV SOLN: INTRAVENOUS | Status: DC

## 2019-05-25 MED ORDER — SODIUM CHLORIDE 0.9 % IV SOLN
2.0000 g | Freq: Once | INTRAVENOUS | Status: AC
  Administered 2019-05-25: 2 g via INTRAVENOUS
  Filled 2019-05-25: qty 2

## 2019-05-25 MED ORDER — VANCOMYCIN HCL 500 MG/100ML IV SOLN
500.0000 mg | Freq: Once | INTRAVENOUS | Status: AC
  Administered 2019-05-25: 500 mg via INTRAVENOUS
  Filled 2019-05-25: qty 100

## 2019-05-25 MED ORDER — ACETAMINOPHEN 650 MG RE SUPP
650.0000 mg | Freq: Once | RECTAL | Status: AC
  Administered 2019-05-25: 650 mg via RECTAL
  Filled 2019-05-25: qty 1

## 2019-05-25 MED ORDER — PROMETHAZINE HCL 12.5 MG PO TABS
12.5000 mg | ORAL_TABLET | Freq: Four times a day (QID) | ORAL | Status: DC | PRN
  Administered 2019-05-25 – 2019-05-26 (×2): 12.5 mg via ORAL
  Filled 2019-05-25 (×2): qty 1

## 2019-05-25 MED ORDER — ONDANSETRON 4 MG PO TBDP: 4.0000 mg | ORAL_TABLET | Freq: Once | ORAL | Status: DC

## 2019-05-25 MED ORDER — VANCOMYCIN HCL IN DEXTROSE 1-5 GM/200ML-% IV SOLN
1000.0000 mg | Freq: Once | INTRAVENOUS | Status: AC
  Administered 2019-05-25: 1000 mg via INTRAVENOUS
  Filled 2019-05-25: qty 200

## 2019-05-25 NOTE — ED Notes (Signed)
Date and time results received: 05/25/19 1425 (use smartphrase ".now" to insert current time)  Test: lactic Critical Value: 2.0  Name of Provider Notified: Margarita Mail PA  Orders Received? Or Actions Taken?: Orders Received - See Orders for details

## 2019-05-25 NOTE — ED Triage Notes (Signed)
Patient c/o abd pain with nausea, vomiting, and diarrhea. Per patient started early this morning. Denies any fevers or urinary symptoms. Per patient got Covid vaccine yesterday.

## 2019-05-25 NOTE — Plan of Care (Signed)

## 2019-05-25 NOTE — ED Notes (Signed)
Date and time results received: 05/25/19 2:45 PM  (use smartphrase ".now" to insert current time)  Test: lactic acid Critical Value: 2.0  Name of Provider Notified: Brandon Melnick PA  Orders Received? Or Actions Taken?: Orders Received - See Orders for details

## 2019-05-25 NOTE — ED Provider Notes (Signed)
[facility] EMERGENCY DEPARTMENT Provider Note   CSN: [id number] Arrival date & time: 05/25/19  1118     History Chief Complaint  Patient presents with  . Abdominal Pain    [name] is a 64 y.o. female who presents to the ED with vomiting and diarrhea. The patient has an extensive PMH that includes ESRD, with HD on M/W/F, last dialysis 05/23/2019, CAD, anemia of chronic disease, IDDM, valvular disease, pulmonary hypertension, Nephrotic disease with hypoalbuminemia, s/p kidney transplant with immunocompromise on prednisone and prograf. She had her first Anderson -19 vaccination yesterday. She developed severe nausea and vomiting today. She has been unable tp hold anything down. She states that she feels freezing. She has no hx of recurrent episodes of her current complaint. She complains of tenesmus but denies abdominal pain. She denies cough. Diarrhea is watery and brown. HPI     Past Medical History:  Diagnosis Date  . Anemia    of chronic disease  . Blood transfusion without reported diagnosis   . Cardiovascular disease    nonobstructive  . Carotid artery stenosis 2008  . CHF (congestive heart failure) ([place])   . Coronary artery disease   . Diabetes mellitus   . Dyslipidemia   . Edema of lower extremity    with hypo-albuminemia and profound protenuria  . Heart murmur   . Hypertension   . Mitral regurgitation   . Nephrotic syndrome   . Patent foramen ovale   . Pulmonary hypertension, moderate to severe ([place])   . Pulmonary nodule   . Tricuspid regurgitation   . Volume depletion, renal, due to output loss (renal deficit)     Patient Active Problem List   Diagnosis Date Noted  . Nausea with vomiting, unspecified 05/25/2019  . Abnormal CXR (chest x-ray) 05/25/2019  . Acute diastolic CHF (congestive heart failure) ([place]) 12/27/2018  . Acute hypoxemic respiratory failure ([place]) 12/16/2018  . Dyspnea 12/15/2018  . Lung collapse 12/15/2018  . HCAP (healthcare-associated  pneumonia)   . Lobar pneumonia ([place]) 12/02/2018  . Acute respiratory failure with hypoxia ([place]) 12/02/2018  . Hydronephrosis   . Abnormal LFTs (liver function tests) 03/11/2018  . Type 2 diabetes mellitus ([place]) 03/11/2018  . Valvular heart disease 03/11/2018  . Chronic kidney disease on chronic dialysis ([place]) 11/13/2017  . Leukocytosis 09/21/2017  . Generalized weakness 09/21/2017  . Hyperlipidemia associated with type 2 diabetes mellitus ([place]) 02/22/2017  . ESRD (end stage renal disease) ([place]) Aug 25, 2016  . 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 Encounter for aftercare following kidney transplant 05/22/2016  . Chronic diastolic heart failure ([place]) 06/08/2015  . Aortic valve sclerosis 06/08/2015  . Anemia of chronic disease 07/03/2013  . Essential hypertension 12/18/2010    Past Surgical History:  Procedure Laterality Date  . A/V FISTULAGRAM N/A 04/05/2017   Procedure: A/V FISTULAGRAM - Left Arm;  Surgeon: Angelia Mould, MD;  Location: [place] [facility];  Service: Cardiovascular;  Laterality: N/A;  . A/V FISTULAGRAM Left 09/18/2017   Procedure: A/V FISTULAGRAM;  Surgeon: Serafina Mitchell, MD;  Location: [place] [facility];  Service: Cardiovascular;  Laterality: Left;  . A/V SHUNT INTERVENTION Left 04/05/2017   Procedure: A/V SHUNT INTERVENTION;  Surgeon: Angelia Mould, MD;  Location: [place] [facility];  Service: Cardiovascular;  Laterality: Left;  . CARDIAC CATHETERIZATION  2008  . IR REMOVAL TUN CV CATH W/O FL  11/02/2016  . 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 PERIPHERAL VASCULAR BALLOON ANGIOPLASTY Left 09/18/2017   Procedure:  PERIPHERAL VASCULAR BALLOON ANGIOPLASTY;  Surgeon: Serafina Mitchell, MD;  Location: [place] [facility];  Service: Cardiovascular;  Laterality: Left;  Arm Fistula     OB History   No obstetric history on file.     Family History  Problem Relation Age of Onset  . Kidney disease Mother   . Diabetes Mother   .  Diabetes Father   . Heart disease Father     Social History   Tobacco Use  . Smoking status: Never Smoker  . Smokeless tobacco: Never Used  Substance Use Topics  . Alcohol use: No  . Drug use: No    Home Medications Prior to Admission medications   Medication Sig Start Date End Date Taking? Authorizing Provider  acetaminophen (TYLENOL) 500 MG tablet Take 500 mg by mouth every 6 (six) hours as needed for moderate pain or headache.  04/06/16  Yes [provider]  aspirin (ASPIR-LOW) 81 MG EC tablet Take 81 mg by mouth daily.    Yes [provider]  B Complex-C-Folic Acid (RENA-VITE RX) 1 MG TABS Take 1 tablet by mouth daily. 09/24/18  Yes [provider]  cloNIDine (CATAPRES) 0.1 MG tablet Take 0.1 mg by mouth daily.  04/15/19  Yes [provider]  Dulaglutide (TRULICITY) 1.5 WU/9.8JX SOPN INJECT 1.5MG   SUBCUTANEOUSLY ONCE A WEEK Patient taking differently: Inject 1.5 mg into the skin once a week. INJECT 1.5MG   SUBCUTANEOUSLY ONCE A WEEK 04/03/19  Yes Hawks, Christy A, FNP  insulin degludec (TRESIBA FLEXTOUCH) 100 UNIT/ML FlexTouch Pen Inject 10 Units into the skin daily.   Yes [provider]  isosorbide mononitrate (IMDUR) 60 MG 24 hr tablet Take 1 tablet by mouth once daily 03/17/19  Yes Hawks, Christy A, FNP  metoprolol succinate (TOPROL-XL) 25 MG 24 hr tablet Take 25 mg by mouth daily. 03/16/19  Yes [provider]  ondansetron (ZOFRAN) 4 MG tablet Take 1 tablet (4 mg total) by mouth every 8 (eight) hours as needed for nausea or vomiting. 03/24/19  Yes Martin, Mary-Margaret, FNP  predniSONE (DELTASONE) 5 MG tablet TAKE 1 TABLET BY MOUTH ONCE DAILY WITH BREAKFAST Patient taking differently: Take 5 mg by mouth daily.  04/22/18  Yes Hawks, Christy A, FNP  PROGRAF 0.5 MG capsule Take 0.5 mg by mouth daily. Take 0.5mg  by mouth each morning. 10/24/16  Yes [provider]  rosuvastatin (CRESTOR) 20 MG tablet Take 1 tablet by mouth once  daily Patient taking differently: Take 20 mg by mouth daily.  10/28/18  Yes Hawks, Christy A, FNP  sevelamer carbonate (RENVELA) 800 MG tablet Take 1,600-3,200 mg by mouth 5 (five) times daily. Patient takes 4 tablets(3200mg ) 3 times a day with meals and 2 tablets(1600mg ) 2 times a day with snacks 10/02/16  Yes [provider]    Allergies    Other and Tape  Review of Systems   Review of Systems  Constitutional: Positive for chills.  HENT: Negative.   Eyes: Negative.   Respiratory: Negative.   Cardiovascular: Negative.   Gastrointestinal: Positive for diarrhea, nausea and vomiting. Negative for abdominal pain.  Genitourinary: Negative.   Musculoskeletal: Negative.   Neurological: Negative.   Hematological: Bruises/bleeds easily.  All other systems reviewed and are negative.   Physical Exam Updated Vital Signs BP (!) 110/49 (BP Location: Right Arm)   Pulse (!) 52   Temp 97.6 F (36.4 C) (Oral)   Resp 16   Ht 5\' 9"  (1.753 m)   Wt 68 kg  SpO2 98%   BMI 22.14 kg/m   Physical Exam Vitals and nursing note reviewed.  Constitutional:      General: She is not in acute distress.    Appearance: She is well-developed. She is ill-appearing. She is not toxic-appearing or diaphoretic.     Comments: pale  HENT:     Head: Normocephalic and atraumatic.  Eyes:     General: No scleral icterus.    Conjunctiva/sclera: Conjunctivae normal.  Cardiovascular:     Rate and Rhythm: Normal rate and regular rhythm.     Heart sounds: Normal heart sounds. No murmur. No friction rub. No gallop.   Pulmonary:     Effort: Pulmonary effort is normal. No respiratory distress.     Breath sounds: Normal breath sounds.  Abdominal:     General: Bowel sounds are normal. There is no distension.     Palpations: Abdomen is soft. There is no mass.     Tenderness: There is no abdominal tenderness. There is no guarding.  Musculoskeletal:     Cervical back: Normal range of motion.  Skin:     General: Skin is warm and dry.  Neurological:     Mental Status: She is alert and oriented to person, place, and time.  Psychiatric:        Behavior: Behavior normal.     ED Results / Procedures / Treatments   Labs (all labs ordered are listed, but only abnormal results are displayed) Labs Reviewed  CBC WITH DIFFERENTIAL/PLATELET - Abnormal; Notable for the following components:      Result Value   Lymphs Abs 0.6 (*)    All other components within normal limits  COMPREHENSIVE METABOLIC PANEL - Abnormal; Notable for the following components:   Potassium 5.6 (*)    Chloride 94 (*)    Glucose, Bld 200 (*)    BUN 48 (*)    Creatinine, Ser 8.77 (*)    AST 44 (*)    GFR calc non Af Amer 4 (*)    GFR calc Af Amer 5 (*)    Anion gap 16 (*)    All other components within normal limits  LACTIC ACID, PLASMA - Abnormal; Notable for the following components:   Lactic Acid, Venous 2.0 (*)    All other components within normal limits  LACTIC ACID, PLASMA - Abnormal; Notable for the following components:   Lactic Acid, Venous 2.0 (*)    All other components within normal limits  BRAIN NATRIURETIC PEPTIDE - Abnormal; Notable for the following components:   B Natriuretic Peptide 3,037.0 (*)    All other components within normal limits  GLUCOSE, CAPILLARY - Abnormal; Notable for the following components:   Glucose-Capillary 213 (*)    All other components within normal limits  CBG MONITORING, ED - Abnormal; Notable for the following components:   Glucose-Capillary 173 (*)    All other components within normal limits  MRSA PCR SCREENING  CULTURE, BLOOD (ROUTINE X 2)  CULTURE, BLOOD (ROUTINE X 2)  URINE CULTURE  SARS CORONAVIRUS 2 (TAT 6-24 HRS)  LIPASE, BLOOD  APTT  PROTIME-INR  PROCALCITONIN  URINALYSIS, ROUTINE W REFLEX MICROSCOPIC  HEMOGLOBIN A1C  CBC    EKG EKG Interpretation  Date/Time:  Sunday May 25 2019 13:10:35 EDT Ventricular Rate:  88 PR Interval:    QRS  Duration: 107 QT Interval:  418 QTC Calculation: 506 R Axis:   -71 Text Interpretation: Sinus rhythm Atrial premature complex Left anterior fascicular block Abnormal R-wave progression, late  transition Probable left ventricular hypertrophy Abnormal T, consider ischemia, diffuse leads Prolonged QT interval Baseline wander in lead(s) V6 No STEMI Confirmed by Octaviano Glow [phone]) on 05/25/2019 1:14:17 PM   Radiology DG Chest Port 1 View  Result Date: 05/25/2019 CLINICAL DATA:  Fever EXAM: PORTABLE CHEST 1 VIEW COMPARISON:  12/27/2018 FINDINGS: Stable mild cardiomegaly. Densely calcified aorta. Subtle increased density within the right upper lobe which may reflect developing infiltrate. No pleural effusion or pneumothorax. IMPRESSION: Subtle increased density within the right upper lobe which may reflect developing infiltrate. Electronically Signed   By: Davina Poke D.O.   On: 05/25/2019 14:13    Procedures Procedures (including critical care time)  Medications Ordered in ED Medications  ondansetron (ZOFRAN) 4 MG/2ML injection (  Not Given 05/25/19 1219)  ceFEPIme (MAXIPIME) 1 g in sodium chloride 0.9 % 100 mL IVPB (has no administration in time range)  vancomycin (VANCOCIN) IVPB 750 mg/150 ml premix (has no administration in time range)  acetaminophen (TYLENOL) tablet 650 mg (has no administration in time range)    Or  acetaminophen (TYLENOL) suppository 650 mg (has no administration in time range)  senna (SENOKOT) tablet 8.6 mg (8.6 mg Oral Given 05/25/19 1721)  promethazine (PHENERGAN) tablet 12.5 mg (12.5 mg Oral Given 05/25/19 1721)  insulin detemir (LEVEMIR) injection 10 Units (has no administration in time range)  insulin aspart (novoLOG) injection 0-9 Units (3 Units Subcutaneous Given 05/25/19 1738)  aspirin EC tablet 81 mg (81 mg Oral Given 05/25/19 1722)  cloNIDine (CATAPRES) tablet 0.1 mg (0.1 mg Oral Given 05/25/19 1721)  isosorbide mononitrate (IMDUR) 24 hr tablet 60 mg (60 mg  Oral Given 05/25/19 1723)  metoprolol succinate (TOPROL-XL) 24 hr tablet 25 mg (25 mg Oral Given 05/25/19 1724)  rosuvastatin (CRESTOR) tablet 20 mg (20 mg Oral Given 05/25/19 1723)  predniSONE (DELTASONE) tablet 5 mg (5 mg Oral Given 05/25/19 1722)  tacrolimus (PROGRAF) capsule 0.5 mg (0.5 mg Oral Given 05/25/19 1723)  multivitamin (RENA-VIT) tablet 1 tablet (1 tablet Oral Given 05/25/19 1721)  heparin injection 5,000 Units (5,000 Units Subcutaneous Given 05/25/19 1723)  sevelamer carbonate (RENVELA) tablet 3,200 mg (3,200 mg Oral Given 05/25/19 1723)  sevelamer carbonate (RENVELA) tablet 1,600 mg (has no administration in time range)  Chlorhexidine Gluconate Cloth 2 % PADS 6 each (has no administration in time range)  ondansetron (ZOFRAN) injection 4 mg (4 mg Intravenous Given 05/25/19 1216)  acetaminophen (TYLENOL) suppository 650 mg (650 mg Rectal Given 05/25/19 1308)  lactated ringers bolus 500 mL (500 mLs Intravenous New Bag/Given 05/25/19 1333)  ceFEPIme (MAXIPIME) 2 g in sodium chloride 0.9 % 100 mL IVPB (2 g Intravenous New Bag/Given 05/25/19 1356)  metroNIDAZOLE (FLAGYL) IVPB 500 mg (500 mg Intravenous New Bag/Given 05/25/19 1400)  vancomycin (VANCOCIN) IVPB 1000 mg/200 mL premix (1,000 mg Intravenous New Bag/Given 05/25/19 1449)  vancomycin (VANCOREADY) IVPB 500 mg/100 mL (500 mg Intravenous New Bag/Given 05/25/19 1647)    ED Course  I have reviewed the triage vital signs and the nursing notes.  Pertinent labs & imaging results that were available during my care of the patient were reviewed by me and considered in my medical decision making (see chart for details).    MDM Rules/Calculators/A&P                      AT:[id number] and nausea VS: BP (!) 110/49 (BP Location: Right Arm)   Pulse (!) 52   Temp 97.6 F (36.4 C) (Oral)   Resp 16  Ht 5\' 9"  (1.753 m)   Wt 68 kg   SpO2 98%   BMI 22.14 kg/m  PQ:ZRAQTMA is gathered by patient and EMR. DDX:The emergent differential diagnosis for  vomiting includes, but is not limited to ACS/MI, Boerhaave's, DKA, Intracranial Hemorrhage, Ischemic bowel, Meningitis, Sepsis, Acute radiation syndrome, Acute gastric dilation, Acetaminophen toxicity, Adrenal insufficiency, Appendicitis, Aspirin toxicity, Bowel obstruction/ileus, Carbon monoxide poisoning, Cholecystitis, CNS tumor. Digoxin toxicity, Electrolyte abnormalities, Elevated ICP, Gastric outlet obstruction, Hyperemesis gravidarum, Pancreatitis, Peritonitis, Ruptured viscus, Testicular torsion/ovarian torsion, Theophyline toxicity, Biliary colic, Cannabinoid hyperemesis syndrome, Chemotherapy, Disulfiram effect, Erythromycin, ETOH, Gastritis, Gastroenteritis, Gastroparesis, Hepatitis, Ibuprofen, Ipecac toxicity, Labyrinthitis, Migraine, Motion sickness, Narcotic withdrawal, Thyroid, Pregnancy, Peptic ulcer disease, Renal colic, and UTI Labs: I reviewed the labs which show CBC shows no elevated white blood cell count.  Normal hemoglobin.  CMP shows elevated potassium of 5.6.  BUN and creatinine are just above patient's normal values.  She does have an anion gap of 16 probably from her elevated BUN.  Patient's lactic acid is elevated however this is also in the setting of end-stage renal disease.  Her BNP is elevated this appears chronic for the patient.  Patient's lipase is within normal limits.  Imaging: I personally reviewed the images (1 view chest x-ray which show(s) increasing density suggestive of infiltrate in the right upper lobe of the lungs interpretation EKG: Sinus rhythm at a rate of 88 with QT prolongation MDM: Patient here with sepsis from pneumonia versus acute reaction to Covid vaccination #1 yesterday.  Treated as a sepsis case with fluid resuscitation and broad-spectrum antibiotics.  Her vomiting has improved.  She has a benign abdominal exam and I have low suspicion for infection within the abdomen.  Patient will need admission. Patient disposition: Admit Patient condition: . The  patient appears reasonably stabilized for admission considering the current resources, flow, and capabilities available in the ED at this time, and I doubt any other [facility] requiring further screening and/or treatment in the ED prior to admission.  Final Clinical Impression(s) / ED Diagnoses Final diagnoses:  Sepsis due to pneumonia ([place])  Hyperkalemia  Nausea vomiting and diarrhea  QT prolongation    Rx / DC Orders ED Discharge Orders    None       Margarita Mail, PA-C 05/25/19 2053    Wyvonnia Dusky, MD 05/26/19 1322

## 2019-05-25 NOTE — Progress Notes (Addendum)
Pharmacy Antibiotic Note  [name] is a 64 y.o. female admitted on 05/25/2019 with sepsis.  Pharmacy has been consulted for cefepime and vancomycin dosing.  Plan: Vancomycin loading dose of 1500mg . Following pharmacist to verify HD schedule of MWF as PTA. Appropriate dosing is vancomycin 750mg  in the last hour of dialysis thrice weekly. Goal trough 15-20 mcg/mL. cefepime 2gm x 1, followed by cefepime 1gm iv q24h   Height: 5\' 9"  (175.3 cm) Weight: 155 lb (70.3 kg) IBW/kg (Calculated) : 66.2  Temp (24hrs), Avg:102.3 F (39.1 C), Min:102.3 F (39.1 C), Max:102.3 F (39.1 C)  Recent Labs  Lab 05/25/19 1218  WBC 9.1  CREATININE 8.77*    Estimated Creatinine Clearance: 6.8 mL/min (A) (by C-G formula based on SCr of 8.77 mg/dL (H)).    Allergies  Allergen Reactions  . Other Other (See Comments)    Tape Bruises and tears skin, Paper tape tolerated  . Tape Other (See Comments)    Bruises and tears skin Paper tape tolerated Bruises and tears skin Paper tape tolerated Bruises and tears skin Paper tape tolerated    Antimicrobials this admission: 3/21 vancomycin >>  3/21 cefepime >>  3/21 metronidazole x 1   Microbiology results: 3/21 BCx: sent 3/21 UCx: sent   Thank you for allowing pharmacy to be a part of this patient's care.  Donna Christen Marvell Stavola 05/25/2019 1:42 PM

## 2019-05-25 NOTE — H&P (Addendum)
History and Physical    [name] UKG:[id number] DOB: 1955-04-19 DOA: 05/25/2019  PCP: Sharion Balloon, FNP (Confirm with patient/family/NH records and if not entered, this has to be entered at [facility] point of entry) Patient coming from: home  I have personally briefly reviewed patient's old medical records in [place]  Chief Complaint: N/V/D, fever  HPI: [name] is a 64 y.o. female with medical history significant of ESRD, s/p renal transplant -rejected on HD MWF with last dialysis Friday. Historyof DM with last A1C 9.4% 12/02/18, HTN, biventricular HF.She had Covid Vaccine #1 05/24/19. She has developed N/V/D, generalized malaise and Fever at home. For persistent symptoms she presents to [facility]. (For level 3, the HPI must include 4+ descriptors: Location, Quality, Severity, Duration, Timing, Context, modifying factors, associated signs/symptoms and/or status of 3+ chronic problems.)  (Please avoid self-populating past medical history here) (The initial 2-3 lines should be focused and good to copy and paste in the HPI section of the daily progress note).  ED Course: BP stable, Tmax 102. Lab revealed WBC 9.1 with normal diff, K 5.6, Cr 9.77, serum glucose 200. CXR with question of developing infiltrate RUL, no evidence CHF. Code sepsis called: patient received 1 L LR, cefipime, Vanc and Flagyl. She is referred to [facility] for admission for  Fever, possible early pneumonia.  Review of Systems: As per HPI otherwise 10 point review of systems negative. Denies any respiratory symptoms, no cough, no sputum production. Not short of breath. Unacceptable ROS statements: "10 systems reviewed," "Extensive" (without elaboration).  Acceptable ROS statements: "All others negative," "All others reviewed and are negative," and "All others unremarkable," with at [place] documented Can't double dip - if using for HPI can't use for ROS  Past Medical History:  Diagnosis Date  . Anemia    of  chronic disease  . Blood transfusion without reported diagnosis   . Cardiovascular disease    nonobstructive  . Carotid artery stenosis 2008  . CHF (congestive heart failure) ([place])   . Coronary artery disease   . Diabetes mellitus   . Dyslipidemia   . Edema of lower extremity    with hypo-albuminemia and profound protenuria  . Heart murmur   . Hypertension   . Mitral regurgitation   . Nephrotic syndrome   . Patent foramen ovale   . Pulmonary hypertension, moderate to severe ([place])   . Pulmonary nodule   . Tricuspid regurgitation   . Volume depletion, renal, due to output loss (renal deficit)     Past Surgical History:  Procedure Laterality Date  . A/V FISTULAGRAM N/A 04/05/2017   Procedure: A/V FISTULAGRAM - Left Arm;  Surgeon: Angelia Mould, MD;  Location: [facility];  Service: Cardiovascular;  Laterality: N/A;  . A/V FISTULAGRAM Left 09/18/2017   Procedure: A/V FISTULAGRAM;  Surgeon: Serafina Mitchell, MD;  Location: [facility];  Service: Cardiovascular;  Laterality: Left;  . A/V SHUNT INTERVENTION Left 04/05/2017   Procedure: A/V SHUNT INTERVENTION;  Surgeon: Angelia Mould, MD;  Location: [facility];  Service: Cardiovascular;  Laterality: Left;  . CARDIAC CATHETERIZATION  2008  . IR REMOVAL TUN CV CATH W/O FL  11/02/2016  . 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 PERIPHERAL VASCULAR BALLOON ANGIOPLASTY Left 09/18/2017   Procedure: PERIPHERAL VASCULAR BALLOON ANGIOPLASTY;  Surgeon: Serafina Mitchell, MD;  Location: [facility];  Service: Cardiovascular;  Laterality: Left;  Arm Fistula   Soc Hx: married  x 20 years and widowed. Married 24 years to 2nd husband. Has two sons, 7 grandchildren. 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 Lives with husband and 2 grandchildren. Has been I-ADLs.   reports that she has never smoked. She has never used smokeless tobacco. She reports that she does not drink alcohol or use drugs.  Allergies    Allergen Reactions  . Other Other (See Comments)    Tape Bruises and tears skin, Paper tape tolerated  . Tape Other (See Comments)    Bruises and tears skin Paper tape tolerated Bruises and tears skin Paper tape tolerated Bruises and tears skin Paper tape tolerated    Family History  Problem Relation Age of Onset  . Kidney disease Mother   . Diabetes Mother   . Diabetes Father   . Heart disease Father    Unacceptable: Noncontributory, unremarkable, or negative. Acceptable: Family history reviewed and not pertinent (If you reviewed it)  Prior to Admission medications   Medication Sig Start Date End Date Taking? Authorizing Provider  acetaminophen (TYLENOL) 500 MG tablet Take 500 mg by mouth every 6 (six) hours as needed for moderate pain or headache.  04/06/16  Yes [provider]  aspirin (ASPIR-LOW) 81 MG EC tablet Take 81 mg by mouth daily.    Yes [provider]  B Complex-C-Folic Acid (RENA-VITE RX) 1 MG TABS Take 1 tablet by mouth daily. 09/24/18  Yes [provider]  cloNIDine (CATAPRES) 0.1 MG tablet Take 0.1 mg by mouth daily.  04/15/19  Yes [provider]  Dulaglutide (TRULICITY) 1.5 ZO/1.0RU SOPN INJECT 1.5MG   SUBCUTANEOUSLY ONCE A WEEK Patient taking differently: Inject 1.5 mg into the skin once a week. INJECT 1.5MG   SUBCUTANEOUSLY ONCE A WEEK 04/03/19  Yes Hawks, Christy A, FNP  insulin degludec (TRESIBA FLEXTOUCH) 100 UNIT/ML FlexTouch Pen Inject 10 Units into the skin daily.   Yes [provider]  isosorbide mononitrate (IMDUR) 60 MG 24 hr tablet Take 1 tablet by mouth once daily 03/17/19  Yes Hawks, Christy A, FNP  metoprolol succinate (TOPROL-XL) 25 MG 24 hr tablet Take 25 mg by mouth daily. 03/16/19  Yes [provider]  ondansetron (ZOFRAN) 4 MG tablet Take 1 tablet (4 mg total) by mouth every 8 (eight) hours as needed for nausea or vomiting. 03/24/19  Yes Martin, Mary-Margaret, FNP  predniSONE (DELTASONE) 5 MG tablet  TAKE 1 TABLET BY MOUTH ONCE DAILY WITH BREAKFAST Patient taking differently: Take 5 mg by mouth daily.  04/22/18  Yes Hawks, Christy A, FNP  PROGRAF 0.5 MG capsule Take 0.5 mg by mouth daily. Take 0.5mg  by mouth each morning. 10/24/16  Yes [provider]  rosuvastatin (CRESTOR) 20 MG tablet Take 1 tablet by mouth once daily Patient taking differently: Take 20 mg by mouth daily.  10/28/18  Yes Hawks, Christy A, FNP  sevelamer carbonate (RENVELA) 800 MG tablet Take 1,600-3,200 mg by mouth 5 (five) times daily. Patient takes 4 tablets(3200mg ) 3 times a day with meals and 2 tablets(1600mg ) 2 times a day with snacks 10/02/16  Yes [provider]    Physical Exam: Vitals:   05/25/19 1415 05/25/19 1421 05/25/19 1447 05/25/19 1500  BP:  (!) 131/97 (!) 125/54 (!) 122/55  Pulse: 79  81 82  Resp: 20  16 14   Temp:   99.4 F (37.4 C)   TempSrc:   Oral   SpO2: 95%  95% (!) 88%  Weight:      Height:  Constitutional: NAD, calm, comfortable Vitals:   05/25/19 1415 05/25/19 1421 05/25/19 1447 05/25/19 1500  BP:  (!) 131/97 (!) 125/54 (!) 122/55  Pulse: 79  81 82  Resp: 20  16 14   Temp:   99.4 F (37.4 C)   TempSrc:   Oral   SpO2: 95%  95% (!) 88%  Weight:      Height:       General: chronically ill appearing woman in no distress Eyes: PERRL, lids and conjunctivae normal ENMT: Mucous membranes are moist. Posterior pharynx clear of any exudate or lesions.Tongue coated. Poor dentition, missing several teeth. Neck: normal, supple, no masses, no thyromegaly Respiratory: clear to auscultation bilaterally, no wheezing, no crackles. Normal respiratory effort. No accessory muscle use.  Cardiovascular: Regular rate and rhythm, II/VI systolic mm best at left sternal border /no  rubs / gallops. No extremity edema. 1+ pedal pulses. No carotid bruits.  Abdomen: no tenderness, no masses palpated. No hepatosplenomegaly. Bowel sounds positive.  Musculoskeletal: no clubbing / cyanosis. No  joint deformity upper and lower extremities. Good ROM, no contractures. Decreased muscle tone.  Skin: no rashes, lesions, ulcers. No induration Neurologic: CN 2-12 grossly intact. Sensation intact, DTR normal. Strength 5/5 in all 4.  Psychiatric: Normal judgment and insight. Alert and oriented x 3. Normal mood.   (Anything < 9 systems with 2 bullets each down codes to level 1) (If patient refuses exam can't bill higher level) (Make sure to document decubitus ulcers present on admission -- if possible -- and whether patient has chronic indwelling catheter at time of admission)  Labs on Admission: I have personally reviewed following labs and imaging studies  CBC: Recent Labs  Lab 05/25/19 1218  WBC 9.1  NEUTROABS 7.5  HGB 12.6  HCT 41.8  MCV 97.4  PLT 297   Basic Metabolic Panel: Recent Labs  Lab 05/25/19 1218  NA 136  K 5.6*  CL 94*  CO2 26  GLUCOSE 200*  BUN 48*  CREATININE 8.77*  CALCIUM 9.2   GFR: Estimated Creatinine Clearance: 6.8 mL/min (A) (by C-G formula based on SCr of 8.77 mg/dL (H)). Liver Function Tests: Recent Labs  Lab 05/25/19 1218  AST 44*  ALT 35  ALKPHOS 100  BILITOT 0.9  PROT 8.0  ALBUMIN 3.5   Recent Labs  Lab 05/25/19 1218  LIPASE 40   No results for input(s): AMMONIA in the last 168 hours. Coagulation Profile: Recent Labs  Lab 05/25/19 1351  INR 1.1   Cardiac Enzymes: No results for input(s): CKTOTAL, CKMB, CKMBINDEX, TROPONINI in the last 168 hours. BNP (last 3 results) No results for input(s): PROBNP in the last 8760 hours. HbA1C: No results for input(s): HGBA1C in the last 72 hours. CBG: Recent Labs  Lab 05/25/19 1315  GLUCAP 173*   Lipid Profile: No results for input(s): CHOL, HDL, LDLCALC, TRIG, CHOLHDL, LDLDIRECT in the last 72 hours. Thyroid Function Tests: No results for input(s): TSH, T4TOTAL, FREET4, T3FREE, THYROIDAB in the last 72 hours. Anemia Panel: No results for input(s): VITAMINB12, FOLATE, FERRITIN,  TIBC, IRON, RETICCTPCT in the last 72 hours. Urine analysis:    Component Value Date/Time   COLORURINE YELLOW 12/02/2018 0622   APPEARANCEUR Cloudy (A) 01/23/2019 1200   LABSPEC 1.013 12/02/2018 0622   PHURINE 7.0 12/02/2018 0622   GLUCOSEU Trace (A) 01/23/2019 1200   HGBUR SMALL (A) 12/02/2018 0622   BILIRUBINUR Negative 01/23/2019 1200   KETONESUR NEGATIVE 12/02/2018 0622   PROTEINUR 2+ (A) 01/23/2019 1200   PROTEINUR  100 (A) 12/02/2018 0622   NITRITE Negative 01/23/2019 1200   NITRITE NEGATIVE 12/02/2018 0622   LEUKOCYTESUR 3+ (A) 01/23/2019 1200   LEUKOCYTESUR LARGE (A) 12/02/2018 0622    Radiological Exams on Admission: DG Chest Port 1 View  Result Date: 05/25/2019 CLINICAL DATA:  Fever EXAM: PORTABLE CHEST 1 VIEW COMPARISON:  12/27/2018 FINDINGS: Stable mild cardiomegaly. Densely calcified aorta. Subtle increased density within the right upper lobe which may reflect developing infiltrate. No pleural effusion or pneumothorax. IMPRESSION: Subtle increased density within the right upper lobe which may reflect developing infiltrate. Electronically Signed   By: Davina Poke D.O.   On: 05/25/2019 14:13    EKG: Independently reviewed. Sinus rhythm, QT prolongation that is stable. LVH. T wave inverstion AvF, V5, V6. This is unchanged from previous EKGs  Assessment/Plan Active Problems:   Nausea with vomiting, unspecified  (please populate well all problems here in Problem List. (For example, if patient is on BP meds at home and you resume or decide to hold them, it is a problem that needs to be her. Same for CAD, COPD, HLD and so on)   1. N/V/D - asymptomatic at exam. Question if these symptoms, including fever, may be a reaction to covid vaccine received Friday. Plan Gentle IV hydration  Phenergan PO prn nausea  APAP for fever  2. ID - question of early infiltrate on CXR. No respiratory symptoms, No cough, no sputum, no leukocytosis, normal differential.  Patient has  received 1 liter bolus, Cefipime, Vanc, Flagyl. Plan Procalcitonin ordered-resulted at 5.50  Continue Cefipime and VAnc  F/u CXR in AM, f/u CBCD in AM  3. Renal - patient with non-functioning transplanted kidney on HD. She is above dry weight of 65.4 Kg but does not appear fluid overloaded. Plan Continue home regimen: prednisone, prograf  For HD in AM - nephrology notified  4. Cardiology - h/o CHF. Last Echo 2020 with LVEF 68-12%, LVH, Diastolic dysfunction. No evidence of decompensation on exam Plan  No indication for tele admit  BNP ordered resulted at 3,037- with no sign decompensation no diuretics - for   HD in AM  Continue home medical regimen.  5. DM - last A1C 12/02/18 9.4%. Patient endorse elevated CBGs at home. Has been adherent with medical regimen Plan Hold trulicity  Continue Treseba and Levmir  Glycemic protocol with A1C in AM   DVT prophylaxis: heparin (Lovenox/Heparin/SCD's/anticoagulated/None (if comfort care) Code Status: full code (Full/Partial (specify details) Family Communication: husband present during exam (Specify name, relationship. Do not write "discussed with patient". Specify tel # if discussed over the phone) Disposition Plan: home when stable (specify when and where you expect patient to be discharged) Consults called: Nephrology - Dr. Melvia Heaps notified and will put in HD orders (with names) Admission status: inpatient (inpatient / obs / tele / medical floor / SDU)   Adella Hare MD [facility] Pager [phone]  If 7PM-7AM, please contact night-coverage [email or web address] Password [facility]  05/25/2019, 3:43 PM

## 2019-05-25 NOTE — ED Notes (Signed)
Pt states she was COVID positive 3 months ago. Did receive her first COVID vaccine on Friday 05/23/19.

## 2019-05-26 ENCOUNTER — Inpatient Hospital Stay (HOSPITAL_COMMUNITY): Payer: BC Managed Care – PPO

## 2019-05-26 DIAGNOSIS — Z940 Kidney transplant status: Secondary | ICD-10-CM | POA: Diagnosis not present

## 2019-05-26 DIAGNOSIS — I5032 Chronic diastolic (congestive) heart failure: Secondary | ICD-10-CM | POA: Diagnosis not present

## 2019-05-26 DIAGNOSIS — N186 End stage renal disease: Secondary | ICD-10-CM | POA: Diagnosis not present

## 2019-05-26 DIAGNOSIS — I2510 Atherosclerotic heart disease of native coronary artery without angina pectoris: Secondary | ICD-10-CM | POA: Diagnosis not present

## 2019-05-26 DIAGNOSIS — I132 Hypertensive heart and chronic kidney disease with heart failure and with stage 5 chronic kidney disease, or end stage renal disease: Secondary | ICD-10-CM | POA: Diagnosis not present

## 2019-05-26 DIAGNOSIS — I10 Essential (primary) hypertension: Secondary | ICD-10-CM

## 2019-05-26 DIAGNOSIS — J189 Pneumonia, unspecified organism: Secondary | ICD-10-CM | POA: Diagnosis not present

## 2019-05-26 DIAGNOSIS — I509 Heart failure, unspecified: Secondary | ICD-10-CM | POA: Diagnosis not present

## 2019-05-26 LAB — CBC
HCT: 34.8 % — ABNORMAL LOW (ref 36.0–46.0)
Hemoglobin: 10.6 g/dL — ABNORMAL LOW (ref 12.0–15.0)
MCH: 29.4 pg (ref 26.0–34.0)
MCHC: 30.5 g/dL (ref 30.0–36.0)
MCV: 96.4 fL (ref 80.0–100.0)
Platelets: 158 10*3/uL (ref 150–400)
RBC: 3.61 MIL/uL — ABNORMAL LOW (ref 3.87–5.11)
RDW: 14.6 % (ref 11.5–15.5)
WBC: 6.4 10*3/uL (ref 4.0–10.5)
nRBC: 0 % (ref 0.0–0.2)

## 2019-05-26 LAB — GLUCOSE, CAPILLARY
Glucose-Capillary: 158 mg/dL — ABNORMAL HIGH (ref 70–99)
Glucose-Capillary: 150 mg/dL — ABNORMAL HIGH (ref 70–99)
Glucose-Capillary: 237 mg/dL — ABNORMAL HIGH (ref 70–99)
Glucose-Capillary: 197 mg/dL — ABNORMAL HIGH (ref 70–99)

## 2019-05-26 LAB — HEMOGLOBIN A1C
Hgb A1c MFr Bld: 8.3 % — ABNORMAL HIGH (ref 4.8–5.6)
Mean Plasma Glucose: 191.51 mg/dL

## 2019-05-26 LAB — SARS CORONAVIRUS 2 (TAT 6-24 HRS): SARS Coronavirus 2: NEGATIVE

## 2019-05-26 MED ORDER — BISACODYL 10 MG RE SUPP
10.0000 mg | Freq: Once | RECTAL | Status: DC
  Filled 2019-05-26: qty 1

## 2019-05-26 MED ORDER — DARBEPOETIN ALFA 40 MCG/0.4ML IJ SOSY
40.0000 ug | PREFILLED_SYRINGE | Freq: Once | INTRAMUSCULAR | Status: AC
  Administered 2019-05-26: 40 ug via SUBCUTANEOUS
  Filled 2019-05-26: qty 0.4

## 2019-05-26 MED ORDER — LACTULOSE 10 GM/15ML PO SOLN
30.0000 g | ORAL | Status: AC
  Administered 2019-05-26 (×2): 30 g via ORAL
  Filled 2019-05-26: qty 60

## 2019-05-26 MED ORDER — HEPARIN SODIUM (PORCINE) 1000 UNIT/ML DIALYSIS: 2000.0000 [IU] | INTRAMUSCULAR | Status: DC | PRN

## 2019-05-26 NOTE — Procedures (Signed)
    HEMODIALYSIS TREATMENT NOTE:  3.5 hour heparin-free HD completed via left forearm AVF (16g/antegrade). Goal NOT met: UF impeded by hypotension (asymptomatic).  UF was frequently interrupted and/or required rate reduction.  Net UF 767cc.  All blood was returned and hemostasis was achieved in 10 minutes.  [name], RN

## 2019-05-26 NOTE — Progress Notes (Signed)
Patient Demographics:    [name], is a 64 y.o. female, DOB - 04/29/1955, PTW:[id number]  Admit date - 05/25/2019   Admitting Physician Neena Rhymes, MD  Outpatient Primary MD for the patient is Sharion Balloon, FNP  LOS - 1   Chief Complaint  Patient presents with  . Abdominal Pain        Subjective:    [name] today has no further fevers, no further emesis,  No chest pain,  --Hemodialysis interrupted due to hypotension  Assessment  & Plan :    Active Problems:   Essential hypertension   Chronic diastolic heart failure (HCC)   ESRD (end stage renal disease) ([place])   Deceased-donor kidney transplant recipient   Hyperlipidemia associated with type 2 diabetes mellitus (HCC)   Chronic kidney disease on chronic dialysis (HCC)   Type 2 diabetes mellitus (HCC)   Nausea with vomiting, unspecified   Abnormal CXR (chest x-ray)  Brief Summary:- 64 y.o. female with medical history significant of ESRD, s/p renal transplant -rejected on HD MWF with last dialysis Friday. Historyof DM with last A1C 9.4% 12/02/18, HTN, biventricular HF.She had Covid Vaccine #1 05/24/19----admitted on 05/25/2019 with fevers nausea and vomiting   A/p 1) right-sided pneumonia--- suspect aspiration related, okay to de-escalate to Unasyn....   Stop Vanco/cefepime -Continue bronchodilators and mucolytics  2)ESRD--patient is status post prior renal transplant hemodialysis was interrupted due to hypotension -Continue prednisone and Prograf  3) postvaccination syndrome------ fevers myalgias nausea vomiting resolving  4)HFrEF--- patient with history of combined diastolic and systolic dysfunction CHF----clinically appears stable, continue to use hemodialysis to address volume status  5)DM2-recent A1c in September 2020 was 9.4 reflecting uncontrolled DM--- Trulicity on hold during this admission, continue Tresiba/Levemir,  Use Novolog/Humalog Sliding scale insulin with Accu-Cheks/Fingersticks as ordered    Disposition/Need for in-Hospital Stay- patient unable to be discharged at this time due to --- right-sided aspiration pneumonia in an immunocompromised patient in the setting of recurrent emesis and required-continue IV Unasyn -Not medically ready for discharge (very immunocompromised given s/p transplant status) -Possible discharge home on 05/27/2019 if continues to improve  Code Status : Full  Family Communication:   NA (patient is alert, awake and coherent) Consults  :  nephrology  DVT Prophylaxis  :  - Heparin - SCDs   Lab Results  Component Value Date   PLT 158 05/26/2019    Inpatient Medications  Scheduled Meds: . aspirin EC  81 mg Oral Daily  . bisacodyl  10 mg Rectal Once  . Chlorhexidine Gluconate Cloth  6 each Topical Q0600  . cloNIDine  0.1 mg Oral Daily  . heparin injection (subcutaneous)  5,000 Units Subcutaneous Q8H  . insulin aspart  0-9 Units Subcutaneous TID WC  . insulin detemir  10 Units Subcutaneous QHS  . isosorbide mononitrate  60 mg Oral Daily  . metoprolol succinate  25 mg Oral Daily  . multivitamin  1 tablet Oral Daily  . predniSONE  5 mg Oral Daily  . rosuvastatin  20 mg Oral Daily  . senna  1 tablet Oral BID  . sevelamer carbonate  1,600 mg Oral BID BM  . sevelamer carbonate  3,200 mg Oral TID WC  . tacrolimus  0.5 mg Oral Daily  Continuous Infusions: . ceFEPime (MAXIPIME) IV 1 g (05/26/19 1900)  . vancomycin 750 mg (05/26/19 1800)   PRN Meds:.acetaminophen **OR** acetaminophen, [START ON 05/27/2019] heparin, promethazine    Anti-infectives (From admission, onward)   Start     Dose/Rate Route Frequency Ordered Stop   05/26/19 1500  ceFEPIme (MAXIPIME) 1 g in sodium chloride 0.9 % 100 mL IVPB     1 g 200 mL/hr over 30 Minutes Intravenous Every 24 hours 05/25/19 1342     05/26/19 1200  vancomycin (VANCOCIN) IVPB 750 mg/150 ml premix     750 mg 150 mL/hr  over 60 Minutes Intravenous Every M-W-F (Hemodialysis) 05/25/19 1349     05/25/19 1345  vancomycin (VANCOREADY) IVPB 500 mg/100 mL     500 mg 100 mL/hr over 60 Minutes Intravenous  Once 05/25/19 1341 05/26/19 0742   05/25/19 1330  ceFEPIme (MAXIPIME) 2 g in sodium chloride 0.9 % 100 mL IVPB     2 g 200 mL/hr over 30 Minutes Intravenous  Once 05/25/19 1322 05/26/19 0741   05/25/19 1330  metroNIDAZOLE (FLAGYL) IVPB 500 mg     500 mg 100 mL/hr over 60 Minutes Intravenous  Once 05/25/19 1322 05/26/19 0742   05/25/19 1330  vancomycin (VANCOCIN) IVPB 1000 mg/200 mL premix     1,000 mg 200 mL/hr over 60 Minutes Intravenous  Once 05/25/19 1322 05/26/19 0742        Objective:   Vitals:   05/26/19 1815 05/26/19 1830 05/26/19 1845 05/26/19 1900  BP: 105/63 (!) 114/55 (!) 110/52 (!) 126/58  Pulse: (!) 50 (!) 51 (!) 52 (!) 56  Resp:      Temp:      TempSrc:      SpO2:      Weight:      Height:        Wt Readings from Last 3 Encounters:  05/26/19 67.5 kg  04/22/19 69.4 kg  12/28/18 66.1 kg     Intake/Output Summary (Last 24 hours) at 05/26/2019 1947 Last data filed at 05/26/2019 1915 Gross per 24 hour  Intake 480 ml  Output 767 ml  Net -287 ml     Physical Exam  Gen:- Awake Alert,  In no apparent distress  HEENT:- [place].AT, No sclera icterus Neck-Supple Neck,No JVD,.  Lungs-diminished in bases, right more than left, few scattered rhonchi, no wheezing  CV- S1, S2 normal, regular  Abd-  +ve B.Sounds, Abd Soft, No tenderness,    Extremity/Skin:- No  edema, pedal pulses present  Psych-affect is appropriate, oriented x3 Neuro-no new focal deficits, no tremors   Data Review:   Micro Results Recent Results (from the past 240 hour(s))  Blood Culture (routine x 2)     Status: None (Preliminary result)   Collection Time: 05/25/19  1:50 PM   Specimen: BLOOD  Result Value Ref Range Status   Specimen Description BLOOD  Final   Special Requests NONE  Final   Culture   Final    NO  GROWTH < 24 HOURS Performed at [facility], [address], [place], [place] [address]    Report Status PENDING  Incomplete  Blood Culture (routine x 2)     Status: None (Preliminary result)   Collection Time: 05/25/19  1:51 PM   Specimen: BLOOD  Result Value Ref Range Status   Specimen Description BLOOD  Final   Special Requests NONE  Final   Culture   Final    NO GROWTH < 24 HOURS Performed at [facility], [address], [place], [place] [address]    Report Status PENDING  Incomplete  SARS CORONAVIRUS 2 (TAT 6-24 HRS) Nasopharyngeal Nasopharyngeal Swab     Status: None   Collection Time: 05/25/19  3:22 PM   Specimen: Nasopharyngeal Swab  Result Value Ref Range Status   SARS Coronavirus 2 NEGATIVE NEGATIVE Final    Comment: (NOTE) SARS-CoV-2 target nucleic acids are NOT DETECTED. The SARS-CoV-2 RNA is generally detectable in upper and lower respiratory specimens during the acute phase of infection. Negative results do not preclude SARS-CoV-2 infection, do not rule out co-infections with other pathogens, and should not be used as the sole basis for treatment or other patient management decisions. Negative results must be combined with clinical observations, patient history, and epidemiological information. The expected result is Negative. Fact Sheet for Patients: SugarRoll.be Fact Sheet for Healthcare Providers: https://www.woods-mathews.com/ This test is not yet approved or cleared by the [place] FDA and  has been authorized for detection and/or diagnosis of SARS-CoV-2 by FDA under an Emergency Use Authorization (EUA). This EUA will remain  in effect (meaning this test can be used) for the duration of the COVID-19 declaration under Section 56 4(b)(1) of the Act, 21 U.S.C. section 360bbb-3(b)(1), unless the authorization is terminated or revoked sooner. Performed at [facility], [place] [address] [place], [place] [address]    MRSA PCR Screening     Status: None   Collection Time: 05/25/19  6:40 PM   Specimen: Nasopharyngeal Swab  Result Value Ref Range Status   MRSA by PCR NEGATIVE NEGATIVE Final    Comment:        The GeneXpert MRSA Assay (FDA approved for NASAL specimens only), is one component of a comprehensive MRSA colonization surveillance program. It is not intended to diagnose MRSA infection nor to guide or monitor treatment for MRSA infections. Performed at [facility], [address] [place], [place] [address]     Radiology Reports DG Chest [place] 1 View  Result Date: 05/25/2019 CLINICAL DATA:  Fever EXAM: PORTABLE CHEST 1 VIEW COMPARISON:  12/27/2018 FINDINGS: Stable mild cardiomegaly. Densely calcified aorta. Subtle increased density within the right upper lobe which may reflect developing infiltrate. No pleural effusion or pneumothorax. IMPRESSION: Subtle increased density within the right upper lobe which may reflect developing infiltrate. Electronically Signed   By: Davina Poke D.O.   On: 05/25/2019 14:13     CBC Recent Labs  Lab 05/25/19 1218 05/26/19 0427  WBC 9.1 6.4  HGB 12.6 10.6*  HCT 41.8 34.8*  PLT 163 158  MCV 97.4 96.4  MCH 29.4 29.4  MCHC 30.1 30.5  RDW 14.6 14.6  LYMPHSABS 0.6*  --   MONOABS 0.7  --   EOSABS 0.3  --   BASOSABS 0.1  --     Chemistries  Recent Labs  Lab 05/25/19 1218  NA 136  K 5.6*  CL 94*  CO2 26  GLUCOSE 200*  BUN 48*  CREATININE 8.77*  CALCIUM 9.2  AST 44*  ALT 35  ALKPHOS 100  BILITOT 0.9   ------------------------------------------------------------------------------------------------------------------ No results for input(s): CHOL, HDL, LDLCALC, TRIG, CHOLHDL, LDLDIRECT in the last 72 hours.  Lab Results  Component Value Date   HGBA1C 8.3 (H) 05/26/2019   ------------------------------------------------------------------------------------------------------------------ No results for input(s): TSH, T4TOTAL, T3FREE,  THYROIDAB in the last 72 hours.  Invalid input(s): FREET3 ------------------------------------------------------------------------------------------------------------------ No results for input(s): VITAMINB12, FOLATE, FERRITIN, TIBC, IRON, RETICCTPCT in the last 72 hours.  Coagulation profile Recent Labs  Lab 05/25/19 1351  INR 1.1    No results for input(s): DDIMER in the last 72 hours.  Cardiac Enzymes No results for input(s): CKMB, TROPONINI, MYOGLOBIN in the last 168 hours.  Invalid input(s): CK ------------------------------------------------------------------------------------------------------------------    Component Value Date/Time   BNP 3,037.0 (H) 05/25/2019 1351     Roxan Hockey M.D on 05/26/2019 at 7:47 PM  Go to [email or web address] - for contact info  [facility] - Office  [phone]

## 2019-05-26 NOTE — Consult Note (Signed)
[facility] Renal Consultation Note    Indication for Consultation:  Management of ESRD/hemodialysis; anemia, hypertension/volume and secondary hyperparathyroidism  HPI: [name] is a 64 y.o. female.  Pt has a PMH significant for HTN, CHF, pulmonary HTN, DM, CAD, and ESRD s/p failed transplant now on HD MWF at [facility], who presented to [facility] ED with N/V/D and abdominal pain after her Levan Hurst covid-19 vaccine the day before. In the ED, CXR with possible early infiltrate and was admitted for IV abx.  We have been consulted to provide HD while she remains an inpatient.  She has not missed HD and had an uneventful treatment on Friday per the nsg staff at her home unit.  She is feeling better today and denies any N/V/D, SOB, CP, edema.     Past Medical History:  Diagnosis Date  . Anemia    of chronic disease  . Blood transfusion without reported diagnosis   . Cardiovascular disease    nonobstructive  . Carotid artery stenosis 2008  . CHF (congestive heart failure) ([place])   . Coronary artery disease   . Diabetes mellitus   . Dyslipidemia   . Edema of lower extremity    with hypo-albuminemia and profound protenuria  . Heart murmur   . Hypertension   . Mitral regurgitation   . Nephrotic syndrome   . Patent foramen ovale   . Pulmonary hypertension, moderate to severe ([place])   . Pulmonary nodule   . Tricuspid regurgitation   . Volume depletion, renal, due to output loss (renal deficit)    Past Surgical History:  Procedure Laterality Date  . A/V FISTULAGRAM N/A 04/05/2017   Procedure: A/V FISTULAGRAM - Left Arm;  Surgeon: Angelia Mould, MD;  Location: [place] [facility];  Service: Cardiovascular;  Laterality: N/A;  . A/V FISTULAGRAM Left 09/18/2017   Procedure: A/V FISTULAGRAM;  Surgeon: Serafina Mitchell, MD;  Location: [place] [facility];  Service: Cardiovascular;  Laterality: Left;  . A/V SHUNT INTERVENTION Left 04/05/2017   Procedure: A/V SHUNT  INTERVENTION;  Surgeon: Angelia Mould, MD;  Location: [place] [facility];  Service: Cardiovascular;  Laterality: Left;  . CARDIAC CATHETERIZATION  2008  . IR REMOVAL TUN CV CATH W/O FL  11/02/2016  . 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 PERIPHERAL VASCULAR BALLOON ANGIOPLASTY Left 09/18/2017   Procedure: PERIPHERAL VASCULAR BALLOON ANGIOPLASTY;  Surgeon: Serafina Mitchell, MD;  Location: [place] [facility];  Service: Cardiovascular;  Laterality: Left;  Arm Fistula   Family History:   Family History  Problem Relation Age of Onset  . Kidney disease Mother   . Diabetes Mother   . Diabetes Father   . Heart disease Father    Social History:  reports that she has never smoked. She has never used smokeless tobacco. She reports that she does not drink alcohol or use drugs. Allergies  Allergen Reactions  . Other Other (See Comments)    Tape Bruises and tears skin, Paper tape tolerated  . Tape Other (See Comments)    Bruises and tears skin Paper tape tolerated Bruises and tears skin Paper tape tolerated Bruises and tears skin Paper tape tolerated   Prior to Admission medications   Medication Sig Start Date End Date Taking? Authorizing Provider  acetaminophen (TYLENOL) 500 MG tablet Take 500 mg by mouth every 6 (six) hours as needed for moderate pain or headache.  04/06/16  Yes [provider]  aspirin (ASPIR-LOW) 81 MG EC tablet Take 81  mg by mouth daily.    Yes [provider]  B Complex-C-Folic Acid (RENA-VITE RX) 1 MG TABS Take 1 tablet by mouth daily. 09/24/18  Yes [provider]  cloNIDine (CATAPRES) 0.1 MG tablet Take 0.1 mg by mouth daily.  04/15/19  Yes [provider]  Dulaglutide (TRULICITY) 1.5 GU/4.4IH SOPN INJECT 1.5MG   SUBCUTANEOUSLY ONCE A WEEK Patient taking differently: Inject 1.5 mg into the skin once a week. INJECT 1.5MG   SUBCUTANEOUSLY ONCE A WEEK 04/03/19  Yes Hawks, Christy A, FNP  insulin degludec (TRESIBA FLEXTOUCH) 100 UNIT/ML  FlexTouch Pen Inject 10 Units into the skin daily.   Yes [provider]  isosorbide mononitrate (IMDUR) 60 MG 24 hr tablet Take 1 tablet by mouth once daily 03/17/19  Yes Hawks, Christy A, FNP  metoprolol succinate (TOPROL-XL) 25 MG 24 hr tablet Take 25 mg by mouth daily. 03/16/19  Yes [provider]  ondansetron (ZOFRAN) 4 MG tablet Take 1 tablet (4 mg total) by mouth every 8 (eight) hours as needed for nausea or vomiting. 03/24/19  Yes Martin, Mary-Margaret, FNP  predniSONE (DELTASONE) 5 MG tablet TAKE 1 TABLET BY MOUTH ONCE DAILY WITH BREAKFAST Patient taking differently: Take 5 mg by mouth daily.  04/22/18  Yes Hawks, Christy A, FNP  PROGRAF 0.5 MG capsule Take 0.5 mg by mouth daily. Take 0.5mg  by mouth each morning. 10/24/16  Yes [provider]  rosuvastatin (CRESTOR) 20 MG tablet Take 1 tablet by mouth once daily Patient taking differently: Take 20 mg by mouth daily.  10/28/18  Yes Hawks, Christy A, FNP  sevelamer carbonate (RENVELA) 800 MG tablet Take 1,600-3,200 mg by mouth 5 (five) times daily. Patient takes 4 tablets(3200mg ) 3 times a day with meals and 2 tablets(1600mg ) 2 times a day with snacks 10/02/16  Yes [provider]   Current Facility-Administered Medications  Medication Dose Route Frequency Provider Last Rate Last Admin  . acetaminophen (TYLENOL) tablet 650 mg  650 mg Oral Q6H PRN Norins, Heinz Knuckles, MD       Or  . acetaminophen (TYLENOL) suppository 650 mg  650 mg Rectal Q6H PRN Norins, Heinz Knuckles, MD      . aspirin EC tablet 81 mg  81 mg Oral Daily Norins, Heinz Knuckles, MD   81 mg at 05/25/19 1722  . ceFEPIme (MAXIPIME) 1 g in sodium chloride 0.9 % 100 mL IVPB  1 g Intravenous Q24H Coffee, Donna Christen, [facility]      . Chlorhexidine Gluconate Cloth 2 % PADS 6 each  6 each Topical Q0600 Roney Jaffe, MD   6 each at 05/26/19 0447  . cloNIDine (CATAPRES) tablet 0.1 mg  0.1 mg Oral Daily Norins, Heinz Knuckles, MD   0.1 mg at 05/25/19 1721  . heparin injection  5,000 Units  5,000 Units Subcutaneous Q8H Norins, Heinz Knuckles, MD   5,000 Units at 05/26/19 0447  . insulin aspart (novoLOG) injection 0-9 Units  0-9 Units Subcutaneous TID WC Norins, Heinz Knuckles, MD   3 Units at 05/25/19 1738  . insulin detemir (LEVEMIR) injection 10 Units  10 Units Subcutaneous QHS Neena Rhymes, MD   10 Units at 05/25/19 2120  . isosorbide mononitrate (IMDUR) 24 hr tablet 60 mg  60 mg Oral Daily Norins, Heinz Knuckles, MD   60 mg at 05/25/19 1723  . metoprolol succinate (TOPROL-XL) 24 hr tablet 25 mg  25 mg Oral Daily Norins, Heinz Knuckles, MD   25 mg at 05/25/19 1724  . multivitamin (RENA-VIT) tablet 1  tablet  1 tablet Oral Daily Norins, Heinz Knuckles, MD   1 tablet at 05/25/19 1721  . predniSONE (DELTASONE) tablet 5 mg  5 mg Oral Daily Norins, Heinz Knuckles, MD   5 mg at 05/25/19 1722  . promethazine (PHENERGAN) tablet 12.5 mg  12.5 mg Oral Q6H PRN Norins, Heinz Knuckles, MD   12.5 mg at 05/26/19 0030  . rosuvastatin (CRESTOR) tablet 20 mg  20 mg Oral Daily Norins, Heinz Knuckles, MD   20 mg at 05/25/19 1723  . senna (SENOKOT) tablet 8.6 mg  1 tablet Oral BID Neena Rhymes, MD   8.6 mg at 05/25/19 2107  . sevelamer carbonate (RENVELA) tablet 1,600 mg  1,600 mg Oral BID BM Norins, Heinz Knuckles, MD      . sevelamer carbonate (RENVELA) tablet 3,200 mg  3,200 mg Oral TID WC Norins, Heinz Knuckles, MD   3,200 mg at 05/25/19 1723  . tacrolimus (PROGRAF) capsule 0.5 mg  0.5 mg Oral Daily Norins, Heinz Knuckles, MD   0.5 mg at 05/25/19 1723  . vancomycin (VANCOCIN) IVPB 750 mg/150 ml premix  750 mg Intravenous Q M,W,F-HD Coffee, Donna Christen, [facility]       Labs: Basic Metabolic Panel: Recent Labs  Lab 05/25/19 1218  NA 136  K 5.6*  CL 94*  CO2 26  GLUCOSE 200*  BUN 48*  CREATININE 8.77*  CALCIUM 9.2   Liver Function Tests: Recent Labs  Lab 05/25/19 1218  AST 44*  ALT 35  ALKPHOS 100  BILITOT 0.9  PROT 8.0  ALBUMIN 3.5   Recent Labs  Lab 05/25/19 1218  LIPASE 40   No results for input(s): AMMONIA in the  last 168 hours. CBC: Recent Labs  Lab 05/25/19 1218 05/26/19 0427  WBC 9.1 6.4  NEUTROABS 7.5  --   HGB 12.6 10.6*  HCT 41.8 34.8*  MCV 97.4 96.4  PLT 163 158   Cardiac Enzymes: No results for input(s): CKTOTAL, CKMB, CKMBINDEX, TROPONINI in the last 168 hours. CBG: Recent Labs  Lab 05/25/19 1315 05/25/19 1729 05/25/19 2047 05/26/19 0733  GLUCAP 173* 213* 256* 158*   Iron Studies: No results for input(s): IRON, TIBC, TRANSFERRIN, FERRITIN in the last 72 hours. Studies/Results: DG Chest Port 1 View  Result Date: 05/25/2019 CLINICAL DATA:  Fever EXAM: PORTABLE CHEST 1 VIEW COMPARISON:  12/27/2018 FINDINGS: Stable mild cardiomegaly. Densely calcified aorta. Subtle increased density within the right upper lobe which may reflect developing infiltrate. No pleural effusion or pneumothorax. IMPRESSION: Subtle increased density within the right upper lobe which may reflect developing infiltrate. Electronically Signed   By: Davina Poke D.O.   On: 05/25/2019 14:13    ROS: Pertinent items are noted in HPI. Physical Exam: Vitals:   05/25/19 2033 05/26/19 0035 05/26/19 0035 05/26/19 0435  BP: (!) 110/49 (!) 120/43 (!) 120/43 (!) 110/38  Pulse: (!) 52 (!) 51 (!) 51 (!) 51  Resp: 16 18 18 16   Temp: 97.6 F (36.4 C) (!) 97.4 F (36.3 C) (!) 97.4 F (36.3 C) (!) 97.5 F (36.4 C)  TempSrc: Oral Oral Oral Oral  SpO2: 98% 94% 95% 96%  Weight:    67.5 kg  Height:          Weight change:  No intake or output data in the 24 hours ending 05/26/19 0850 BP (!) 110/38 (BP Location: Right Arm)   Pulse (!) 51   Temp (!) 97.5 F (36.4 C) (Oral)   Resp 16   Ht 5\' 9"  (1.753  m)   Wt 67.5 kg   SpO2 96%   BMI 21.98 kg/m  General appearance: alert, cooperative and no distress Head: Normocephalic, without obvious abnormality, atraumatic Resp: clear to auscultation bilaterally Cardio: regular rate and rhythm, S1, S2 normal, no murmur, click, rub or gallop GI: soft, non-tender; bowel  sounds normal; no masses,  no organomegaly Extremities: extremities normal, atraumatic, no cyanosis or edema and L AVF +T/B Dialysis Access: LAVF  Dialysis Orders: Center: [place]  on MWF . EDW 68kg HD Bath 2K/2.5Ca  Time 4 hours Heparin none. Access LAVF BFR 300 DFR 600    16 gauge needles, EPO 6,000 IV tiw, hectoral 3.5 mcg IVP tiw, venofer 50 mg IV once a week  Assessment/Plan: 1.  N/V/D- possibly related to covid-19 vaccine.  Currently resolved 2. Possible infiltrate on CXR- on abx per primary svc.  Agree with reimaging after HD.  3.  ESRD -  Continue with HD on MWF schedule.  4.  Hypertension/volume  - She is actually below her edw.  No evidence of edema.  UF as tolerated 5.  Anemia  - cont with esa 6.  Metabolic bone disease -   Cont with meds 7.  Nutrition -  Renal diet. 8. DM- per primary.  Donetta Potts, MD [place] Pager [phone] 05/26/2019, 8:50 AM

## 2019-05-26 NOTE — ED Provider Notes (Signed)
.  Critical Care Performed by: Wyvonnia Dusky, MD Authorized by: Wyvonnia Dusky, MD   Critical care provider statement:    Critical care time (minutes):  38   Critical care was necessary to treat or prevent imminent or life-threatening deterioration of the following conditions:  Sepsis   Critical care was time spent personally by me on the following activities:  Discussions with consultants, evaluation of patient's response to treatment, examination of patient, ordering and performing treatments and interventions, ordering and review of laboratory studies, ordering and review of radiographic studies, pulse oximetry, re-evaluation of patient's condition, obtaining history from patient or surrogate and review of old charts Comments:     IV antibiotics, IV fluid      Wyvonnia Dusky, MD 05/26/19 1322

## 2019-05-27 ENCOUNTER — Telehealth: Payer: Self-pay | Admitting: *Deleted

## 2019-05-27 DIAGNOSIS — Z940 Kidney transplant status: Secondary | ICD-10-CM | POA: Diagnosis not present

## 2019-05-27 DIAGNOSIS — N186 End stage renal disease: Secondary | ICD-10-CM | POA: Diagnosis not present

## 2019-05-27 DIAGNOSIS — I10 Essential (primary) hypertension: Secondary | ICD-10-CM | POA: Diagnosis not present

## 2019-05-27 DIAGNOSIS — I509 Heart failure, unspecified: Secondary | ICD-10-CM | POA: Diagnosis not present

## 2019-05-27 DIAGNOSIS — I132 Hypertensive heart and chronic kidney disease with heart failure and with stage 5 chronic kidney disease, or end stage renal disease: Secondary | ICD-10-CM | POA: Diagnosis not present

## 2019-05-27 DIAGNOSIS — I5032 Chronic diastolic (congestive) heart failure: Secondary | ICD-10-CM | POA: Diagnosis not present

## 2019-05-27 DIAGNOSIS — I2510 Atherosclerotic heart disease of native coronary artery without angina pectoris: Secondary | ICD-10-CM | POA: Diagnosis not present

## 2019-05-27 LAB — RENAL FUNCTION PANEL
Albumin: 2.8 g/dL — ABNORMAL LOW (ref 3.5–5.0)
Anion gap: 11 (ref 5–15)
BUN: 22 mg/dL (ref 8–23)
CO2: 29 mmol/L (ref 22–32)
Calcium: 8.2 mg/dL — ABNORMAL LOW (ref 8.9–10.3)
Chloride: 95 mmol/L — ABNORMAL LOW (ref 98–111)
Creatinine, Ser: 5.44 mg/dL — ABNORMAL HIGH (ref 0.44–1.00)
GFR calc Af Amer: 9 mL/min — ABNORMAL LOW (ref >60)
GFR calc non Af Amer: 8 mL/min — ABNORMAL LOW (ref >60)
Glucose, Bld: 93 mg/dL (ref 70–99)
Phosphorus: 3.6 mg/dL (ref 2.5–4.6)
Potassium: 3.8 mmol/L (ref 3.5–5.1)
Sodium: 135 mmol/L (ref 135–145)

## 2019-05-27 LAB — GLUCOSE, CAPILLARY
Glucose-Capillary: 75 mg/dL (ref 70–99)
Glucose-Capillary: 192 mg/dL — ABNORMAL HIGH (ref 70–99)

## 2019-05-27 MED ORDER — SENNA 8.6 MG PO TABS: 1.0000 | ORAL_TABLET | Freq: Two times a day (BID) | ORAL | 0 refills | Status: DC

## 2019-05-27 MED ORDER — SODIUM CHLORIDE 0.9 % IV SOLN
3.0000 g | Freq: Every day | INTRAVENOUS | Status: DC
  Administered 2019-05-27: 11:00:00 3 g via INTRAVENOUS
  Filled 2019-05-27: qty 8

## 2019-05-27 MED ORDER — ASPIRIN 81 MG PO TBEC: 81.0000 mg | DELAYED_RELEASE_TABLET | Freq: Every day | ORAL | 12 refills | Status: DC

## 2019-05-27 NOTE — Discharge Instructions (Signed)
1)1)Very low-salt diet advised 2)Weigh yourself daily, call if you gain more than 3 pounds in 1 day or more than 5 pounds in 1 week as your hemodialysis dry weight may need to be adjusted 3)Limit your Fluid  intake to no more than 60 ounces (1.8 Liters) per day 4) avoid constipation

## 2019-05-27 NOTE — Discharge Summary (Signed)
[name], is a 64 y.o. female  DOB [birth year]  MRN [id number].  Admission date:  05/25/2019  Admitting Physician  Neena Rhymes, MD  Discharge Date:  05/27/2019   Primary MD  Sharion Balloon, FNP  Recommendations for primary care physician for things to follow:   1)Very low-salt diet advised 2)Weigh yourself daily, call if you gain more than 3 pounds in 1 day or more than 5 pounds in 1 week as your hemodialysis dry weight may need to be adjusted 3)Limit your Fluid  intake to no more than 60 ounces (1.8 Liters) per day 4)Avoid constipation  Admission Diagnosis  Hyperkalemia [E87.5] QT prolongation [R94.31] Nausea vomiting and diarrhea [R11.2, R19.7] Sepsis due to pneumonia ([place]) [J18.9, A41.9] Nausea with vomiting, unspecified [R11.2]   Discharge Diagnosis  Hyperkalemia [E87.5] QT prolongation [R94.31] Nausea vomiting and diarrhea [R11.2, R19.7] Sepsis due to pneumonia ([place]) [J18.9, A41.9] Nausea with vomiting, unspecified [R11.2]    Active Problems:   Essential hypertension   Chronic diastolic heart failure (HCC)   ESRD (end stage renal disease) ([place])   Deceased-donor kidney transplant recipient   Hyperlipidemia associated with type 2 diabetes mellitus ([place])   Chronic kidney disease on chronic dialysis ([place])   Type 2 diabetes mellitus (HCC)   Nausea with vomiting, unspecified   Abnormal CXR (chest x-ray)      Past Medical History:  Diagnosis Date  . Anemia    of chronic disease  . Blood transfusion without reported diagnosis   . Cardiovascular disease    nonobstructive  . Carotid artery stenosis 2008  . CHF (congestive heart failure) ([place])   . Coronary artery disease   . Diabetes mellitus   . Dyslipidemia   . Edema of lower extremity    with hypo-albuminemia and profound protenuria  . Heart murmur   . Hypertension   . Mitral regurgitation   . Nephrotic syndrome   .  Patent foramen ovale   . Pulmonary hypertension, moderate to severe ([place])   . Pulmonary nodule   . Tricuspid regurgitation   . Volume depletion, renal, due to output loss (renal deficit)     Past Surgical History:  Procedure Laterality Date  . A/V FISTULAGRAM N/A 04/05/2017   Procedure: A/V FISTULAGRAM - Left Arm;  Surgeon: Angelia Mould, MD;  Location: [facility];  Service: Cardiovascular;  Laterality: N/A;  . A/V FISTULAGRAM Left 09/18/2017   Procedure: A/V FISTULAGRAM;  Surgeon: Serafina Mitchell, MD;  Location: [facility];  Service: Cardiovascular;  Laterality: Left;  . A/V SHUNT INTERVENTION Left 04/05/2017   Procedure: A/V SHUNT INTERVENTION;  Surgeon: Angelia Mould, MD;  Location: [facility];  Service: Cardiovascular;  Laterality: Left;  . CARDIAC CATHETERIZATION  2008  . IR REMOVAL TUN CV CATH W/O FL  11/02/2016  . 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 PERIPHERAL VASCULAR BALLOON ANGIOPLASTY Left 09/18/2017   Procedure: PERIPHERAL VASCULAR BALLOON ANGIOPLASTY;  Surgeon: Serafina Mitchell, MD;  Location: [facility];  Service: Cardiovascular;  Laterality: Left;  Arm Fistula     HPI  from the history and physical done on the day of admission:    Chief Complaint: N/V/D, fever  HPI: [name] is a 64 y.o. female with medical history significant of ESRD, s/p renal transplant -rejected on HD MWF with last dialysis Friday. Historyof DM with last A1C 9.4% 12/02/18, HTN, biventricular HF.She had Covid Vaccine #1 05/24/19. She has developed N/V/D, generalized malaise and Fever at home. For persistent symptoms she presents to [facility].   ED Course: BP stable, Tmax 102. Lab revealed WBC 9.1 with normal diff, K 5.6, Cr 9.77, serum glucose 200. CXR with question of developing infiltrate RUL, no evidence CHF. Code sepsis called: patient received 1 L LR, cefipime, Vanc and Flagyl. She is [facility] for admission for  Fever, possible early  pneumonia.  Review of Systems: As per HPI otherwise 10 point review of systems negative. Denies any respiratory symptoms, no cough, no sputum production. Not short of breath.     Hospital Course:      1. N/V/D - Resolved--- Question if these symptoms, including fever, may be a reaction to covid vaccine received Friday. --Eating and drinking well, had BM   2)- suspicion of possible pneumonia initially--- on admission 1 view chest x-ray was suggestive of possible pneumonia , 24 hours later 2 view chest x-ray was obtained with no evidence of pneumonia --- ---clinically low index of suspicion for pneumonia, patient has no acute respiratory symptoms, no leukocytosis -She did receive cefepime Flagyl and vancomycin initially -Clinically diagnosis of pneumonia was not confirmed   3. Renal - patient with non-functioning transplanted kidney on HD. She is above dry weight of 65.4 Kg but does not appear fluid overloaded. Plan     Continue home regimen: prednisone, prograf -Dialysis per nephrology team  4. Cardiology - h/o CHF. Last Echo 2020 with LVEF 23-53%, LVH, Diastolic dysfunction. No evidence of decompensation on exam --Continue to use hemodialysis to address volume status             Continue home medical regimen.  5. DM - last A1C 12/02/18 8.3%.--- Reflecting uncontrolled DM PTA --- patient endorse elevated CBGs at home. Has been adherent with medical regimen -c/n trulicity              Continue Morrell Riddle and Levmir  Discharge Condition: stable  Follow UP  Follow-up Information    Sharion Balloon, FNP. Schedule an appointment as soon as possible for a visit in 1 week(s).   Specialty: Family Medicine Why: Call for follow-up within 1 week  Contact information: [place] [address] [phone]            Consults obtained - na  Diet and Activity recommendation:  As advised  Discharge Instructions     Discharge Instructions    Call MD for:   difficulty breathing, headache or visual disturbances   Complete by: As directed    Call MD for:  persistant dizziness or light-headedness   Complete by: As directed    Call MD for:  persistant nausea and vomiting   Complete by: As directed    Call MD for:  severe uncontrolled pain   Complete by: As directed    Call MD for:  temperature >100.4   Complete by: As directed    Diet - low sodium heart healthy   Complete by: As directed    Diet Carb Modified   Complete  by: As directed    Discharge instructions   Complete by: As directed    1)1)Very low-salt diet advised 2)Weigh yourself daily, call if you gain more than 3 pounds in 1 day or more than 5 pounds in 1 week as your hemodialysis dry weight may need to be adjusted 3)Limit your Fluid  intake to no more than 60 ounces (1.8 Liters) per day   Increase activity slowly   Complete by: As directed         Discharge Medications     Allergies as of 05/27/2019      Reactions   Other Other (See Comments)   Tape Bruises and tears skin, Paper tape tolerated   Tape Other (See Comments)   Bruises and tears skin Paper tape tolerated Bruises and tears skin Paper tape tolerated Bruises and tears skin Paper tape tolerated      Medication List    TAKE these medications   acetaminophen 500 MG tablet Commonly known as: TYLENOL Take 500 mg by mouth every 6 (six) hours as needed for moderate pain or headache.   aspirin 81 MG EC tablet Commonly known as: Aspir-Low Take 1 tablet (81 mg total) by mouth daily with breakfast. What changed: when to take this   cloNIDine 0.1 MG tablet Commonly known as: CATAPRES Take 0.1 mg by mouth daily.   isosorbide mononitrate 60 MG 24 hr tablet Commonly known as: IMDUR Take 1 tablet by mouth once daily   metoprolol succinate 25 MG 24 hr tablet Commonly known as: TOPROL-XL Take 25 mg by mouth daily.   ondansetron 4 MG tablet Commonly known as: Zofran Take 1 tablet (4 mg total) by mouth every  8 (eight) hours as needed for nausea or vomiting.   predniSONE 5 MG tablet Commonly known as: DELTASONE TAKE 1 TABLET BY MOUTH ONCE DAILY WITH BREAKFAST What changed: See the new instructions.   Prograf 0.5 MG capsule Generic drug: tacrolimus Take 0.5 mg by mouth daily. Take 0.5mg  by mouth each morning.   Rena-Vite Rx 1 MG Tabs Take 1 tablet by mouth daily.   rosuvastatin 20 MG tablet Commonly known as: CRESTOR Take 1 tablet by mouth once daily   senna 8.6 MG Tabs tablet Commonly known as: SENOKOT Take 1 tablet (8.6 mg total) by mouth 2 (two) times daily.   sevelamer carbonate 800 MG tablet Commonly known as: RENVELA Take 1,600-3,200 mg by mouth 5 (five) times daily. Patient takes 4 tablets(3200mg ) 3 times a day with meals and 2 tablets(1600mg ) 2 times a day with snacks   Tresiba FlexTouch 100 UNIT/ML FlexTouch Pen Generic drug: insulin degludec Inject 10 Units into the skin daily.   Trulicity 1.5 ZO/1.0RU Sopn Generic drug: Dulaglutide INJECT 1.5MG   SUBCUTANEOUSLY ONCE A WEEK What changed:   how much to take  how to take this  when to take this       Major procedures and Radiology Reports - PLEASE review detailed and final reports for all details, in brief -    DG Chest 2 View  Result Date: 05/26/2019 CLINICAL DATA:  Community acquired pneumonia EXAM: CHEST - 2 VIEW COMPARISON:  05/25/2019 FINDINGS: Heart is upper limits normal in size. Lungs are clear. No effusions or edema. No acute bony abnormality. IMPRESSION: No active cardiopulmonary disease. Electronically Signed   By: Rolm Baptise M.D.   On: 05/26/2019 21:49   DG Chest Port 1 View  Result Date: 05/25/2019 CLINICAL DATA:  Fever EXAM: PORTABLE CHEST 1 VIEW COMPARISON:  12/27/2018  FINDINGS: Stable mild cardiomegaly. Densely calcified aorta. Subtle increased density within the right upper lobe which may reflect developing infiltrate. No pleural effusion or pneumothorax. IMPRESSION: Subtle increased density  within the right upper lobe which may reflect developing infiltrate. Electronically Signed   By: Davina Poke D.O.   On: 05/25/2019 14:13    Micro Results    Recent Results (from the past 240 hour(s))  Blood Culture (routine x 2)     Status: None (Preliminary result)   Collection Time: 05/25/19  1:50 PM   Specimen: BLOOD RIGHT HAND  Result Value Ref Range Status   Specimen Description BLOOD RIGHT HAND  Final   Special Requests   Final    BOTTLES DRAWN AEROBIC ONLY Blood Culture results may not be optimal due to an inadequate volume of blood received in culture bottles   Culture   Final    NO GROWTH 2 DAYS Performed at [facility], [address], [place], [place] [address]    Report Status PENDING  Incomplete  Blood Culture (routine x 2)     Status: None (Preliminary result)   Collection Time: 05/25/19  1:51 PM   Specimen: BLOOD RIGHT FOREARM  Result Value Ref Range Status   Specimen Description BLOOD RIGHT FOREARM  Final   Special Requests   Final    BOTTLES DRAWN AEROBIC AND ANAEROBIC Blood Culture results may not be optimal due to an inadequate volume of blood received in culture bottles   Culture   Final    NO GROWTH 2 DAYS Performed at [facility], [address] [place], [place] [address]    Report Status PENDING  Incomplete  SARS CORONAVIRUS 2 (TAT 6-24 HRS) Nasopharyngeal Nasopharyngeal Swab     Status: None   Collection Time: 05/25/19  3:22 PM   Specimen: Nasopharyngeal Swab  Result Value Ref Range Status   SARS Coronavirus 2 NEGATIVE NEGATIVE Final    Comment: (NOTE) SARS-CoV-2 target nucleic acids are NOT DETECTED. The SARS-CoV-2 RNA is generally detectable in upper and lower respiratory specimens during the acute phase of infection. Negative results do not preclude SARS-CoV-2 infection, do not rule out co-infections with other pathogens, and should not be used as the sole basis for treatment or other patient management decisions. Negative results must be  combined with clinical observations, patient history, and epidemiological information. The expected result is Negative. Fact Sheet for Patients: SugarRoll.be Fact Sheet for Healthcare Providers: https://www.woods-mathews.com/ This test is not yet approved or cleared by the [place] FDA and  has been authorized for detection and/or diagnosis of SARS-CoV-2 by FDA under an Emergency Use Authorization (EUA). This EUA will remain  in effect (meaning this test can be used) for the duration of the COVID-19 declaration under Section 56 4(b)(1) of the Act, 21 U.S.C. section 360bbb-3(b)(1), unless the authorization is terminated or revoked sooner. Performed at [facility], [place] [address], [place], [place] [address]   MRSA PCR Screening     Status: None   Collection Time: 05/25/19  6:40 PM   Specimen: Nasopharyngeal Swab  Result Value Ref Range Status   MRSA by PCR NEGATIVE NEGATIVE Final    Comment:        The GeneXpert MRSA Assay (FDA approved for NASAL specimens only), is one component of a comprehensive MRSA colonization surveillance program. It is not intended to diagnose MRSA infection nor to guide or monitor treatment for MRSA infections. Performed at [facility], [address] [place], [place] [address]  Today   Subjective    [name] today has no new complaints ---- No further emesis -Had BM, tolerating oral intake well         Patient has been seen and examined prior to discharge   Objective   Blood pressure (!) 132/46, pulse (!) 57, temperature 97.7 F (36.5 C), temperature source Oral, resp. rate 20, height 5\' 9"  (1.753 m), weight 67.5 kg, SpO2 98 %.   Intake/Output Summary (Last 24 hours) at 05/27/2019 1359 Last data filed at 05/26/2019 1915 Gross per 24 hour  Intake --  Output 767 ml  Net -767 ml   Exam Gen:- Awake Alert, no acute distress  HEENT:- [place].AT, No sclera icterus, poor  dentition Neck-Supple Neck,No JVD,.  Lungs-  CTAB , good air movement bilaterally  CV- S1, S2 normal, regular Abd-  +ve B.Sounds, Abd Soft, No tenderness,    Extremity/Skin:- No  edema,   good pulses Psych-affect is appropriate, oriented x3 Neuro-no new focal deficits, no tremors    Data Review   CBC w Diff:  Lab Results  Component Value Date   WBC 6.4 05/26/2019   HGB 10.6 (L) 05/26/2019   HGB 9.1 (L) 01/23/2019   HCT 34.8 (L) 05/26/2019   HCT 27.4 (L) 01/23/2019   PLT 158 05/26/2019   PLT 208 01/23/2019   LYMPHOPCT 6 05/25/2019   MONOPCT 8 05/25/2019   EOSPCT 3 05/25/2019   BASOPCT 1 05/25/2019    CMP:  Lab Results  Component Value Date   NA 135 05/27/2019   NA 140 01/23/2019   K 3.8 05/27/2019   CL 95 (L) 05/27/2019   CO2 29 05/27/2019   BUN 22 05/27/2019   BUN 28 (H) 01/23/2019   CREATININE 5.44 (H) 05/27/2019   PROT 8.0 05/25/2019   PROT 6.6 01/23/2019   ALBUMIN 2.8 (L) 05/27/2019   ALBUMIN 3.9 01/23/2019   BILITOT 0.9 05/25/2019   BILITOT 0.5 01/23/2019   ALKPHOS 100 05/25/2019   AST 44 (H) 05/25/2019   ALT 35 05/25/2019  .  Total Discharge time is about 33 minutes  Roxan Hockey M.D on 05/27/2019 at 1:59 PM  Go to [email or web address] -  for contact info  [facility] - Office  [phone]

## 2019-05-27 NOTE — Progress Notes (Signed)
Admit: 05/25/2019 LOS: 2  63F ESRD [facility] MWF with N/V/D after COVID19 vaccine  Subjective:  . No c/o . For DC later today most likley . HD yesterday went ok   03/22 0701 - 03/23 0700 In: 480 [P.O.:480] Out: 767   Filed Weights   05/25/19 1630 05/26/19 0435 05/26/19 1540  Weight: 68 kg 67.5 kg 67.5 kg    Scheduled Meds: . aspirin EC  81 mg Oral Daily  . bisacodyl  10 mg Rectal Once  . Chlorhexidine Gluconate Cloth  6 each Topical Q0600  . cloNIDine  0.1 mg Oral Daily  . heparin injection (subcutaneous)  5,000 Units Subcutaneous Q8H  . insulin aspart  0-9 Units Subcutaneous TID WC  . insulin detemir  10 Units Subcutaneous QHS  . isosorbide mononitrate  60 mg Oral Daily  . metoprolol succinate  25 mg Oral Daily  . multivitamin  1 tablet Oral Daily  . predniSONE  5 mg Oral Daily  . rosuvastatin  20 mg Oral Daily  . senna  1 tablet Oral BID  . sevelamer carbonate  1,600 mg Oral BID BM  . sevelamer carbonate  3,200 mg Oral TID WC  . tacrolimus  0.5 mg Oral Daily   Continuous Infusions: . ampicillin-sulbactam (UNASYN) IV 3 g (05/27/19 1040)   PRN Meds:.acetaminophen **OR** acetaminophen, heparin, promethazine  Current Labs: reviewed    Physical Exam:  Blood pressure (!) 132/46, pulse (!) 57, temperature 97.7 F (36.5 C), temperature source Oral, resp. rate 20, height 5\' 9"  (1.753 m), weight 67.5 kg, SpO2 98 %. NAD RRR AVF +B/T LUE No sig LEE EOMI NCAT  A 1. N/V/D, resolved 2. Recent COVID 19 vaccination 3. ESRD MWF, on schedule ,stable 4. HTN/Vol: Stable, euvolemic 5. 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 Will check to see if here tomorrow and if needs inpatient HD . OK for discharge to outpt HD tomorrow . Medication Issues; o Preferred narcotic agents for pain control are hydromorphone, fentanyl, and methadone. Morphine should not be used.  o Baclofen should be avoided o Avoid oral sodium phosphate and magnesium citrate based laxatives  / bowel preps    Pearson Grippe MD 05/27/2019, 2:08 PM  Recent Labs  Lab 05/25/19 1218 05/27/19 0445  NA 136 135  K 5.6* 3.8  CL 94* 95*  CO2 26 29  GLUCOSE 200* 93  BUN 48* 22  CREATININE 8.77* 5.44*  CALCIUM 9.2 8.2*  PHOS  --  3.6   Recent Labs  Lab 05/25/19 1218 05/26/19 0427  WBC 9.1 6.4  NEUTROABS 7.5  --   HGB 12.6 10.6*  HCT 41.8 34.8*  MCV 97.4 96.4  PLT 163 158

## 2019-05-27 NOTE — Telephone Encounter (Signed)
    Transitional Care Management  Contact Attempt Attempt Date:05/27/2019 Attempted By: Zannie Cove, LPN   1st unsuccessful TCM contact attempt.   I reached out to Rockey Situ on her preferred telephone number to discuss Transitional Care Management, medication reconciliation, and to schedule a TCM hospital follow-up with her PCP at [facility].  Discharge Date: 05/27/19   Location: [facility] - to - Home   Discharge Dx: Pneumonia  Recommendations for Outpatient Follow-up:  Follow up in 1 week with PCP ( [facility])    Plan I left a HIPPA compliant message for her to return my call.  Will attempt to contact again within the 2 business day post discharge window if she does not return my call.

## 2019-05-28 ENCOUNTER — Telehealth: Payer: Self-pay | Admitting: *Deleted

## 2019-05-28 DIAGNOSIS — N186 End stage renal disease: Secondary | ICD-10-CM | POA: Diagnosis not present

## 2019-05-28 DIAGNOSIS — Z992 Dependence on renal dialysis: Secondary | ICD-10-CM | POA: Diagnosis not present

## 2019-05-28 NOTE — Telephone Encounter (Signed)
    Transitional Care Management  Contact Attempt Attempt Date:05/28/2019 Attempted By: Eston Mould, LPN  2nd unsuccessful TCM contact attempt.   I reached out to [name] on her preferred telephone number to discuss Transitional Care Management, medication reconciliation, and to schedule a TCM hospital follow-up with her PCP at [facility].  Discharge Date: 05/27/19 Location: [facility] Discharge Dx: Sepsis due to Pneumonia  Recommendations for Outpatient Follow-up:   (insert from discharge summary) Follow up in 1 week with PCP  Plan I attempted to contact pt and was unable to leave a message as voicemail has not been set up.

## 2019-05-30 DIAGNOSIS — N186 End stage renal disease: Secondary | ICD-10-CM | POA: Diagnosis not present

## 2019-05-30 DIAGNOSIS — Z992 Dependence on renal dialysis: Secondary | ICD-10-CM | POA: Diagnosis not present

## 2019-05-30 LAB — CULTURE, BLOOD (ROUTINE X 2)
Culture: NO GROWTH
Culture: NO GROWTH

## 2019-06-02 DIAGNOSIS — Z992 Dependence on renal dialysis: Secondary | ICD-10-CM | POA: Diagnosis not present

## 2019-06-02 DIAGNOSIS — N186 End stage renal disease: Secondary | ICD-10-CM | POA: Diagnosis not present

## 2019-06-04 DIAGNOSIS — N186 End stage renal disease: Secondary | ICD-10-CM | POA: Diagnosis not present

## 2019-06-04 DIAGNOSIS — Z992 Dependence on renal dialysis: Secondary | ICD-10-CM | POA: Diagnosis not present

## 2019-06-06 DIAGNOSIS — Z992 Dependence on renal dialysis: Secondary | ICD-10-CM | POA: Diagnosis not present

## 2019-06-06 DIAGNOSIS — N186 End stage renal disease: Secondary | ICD-10-CM | POA: Diagnosis not present

## 2019-06-08 IMAGING — CR DG CHEST 1V PORT
1 series · 1 of 1 positions shown · non-contrast
Comparison: 06/15/2017

CLINICAL DATA: Sudden onset of nausea chills and headache since
[DATE] this morning.

EXAM:
PORTABLE CHEST 1 VIEW

[portable]
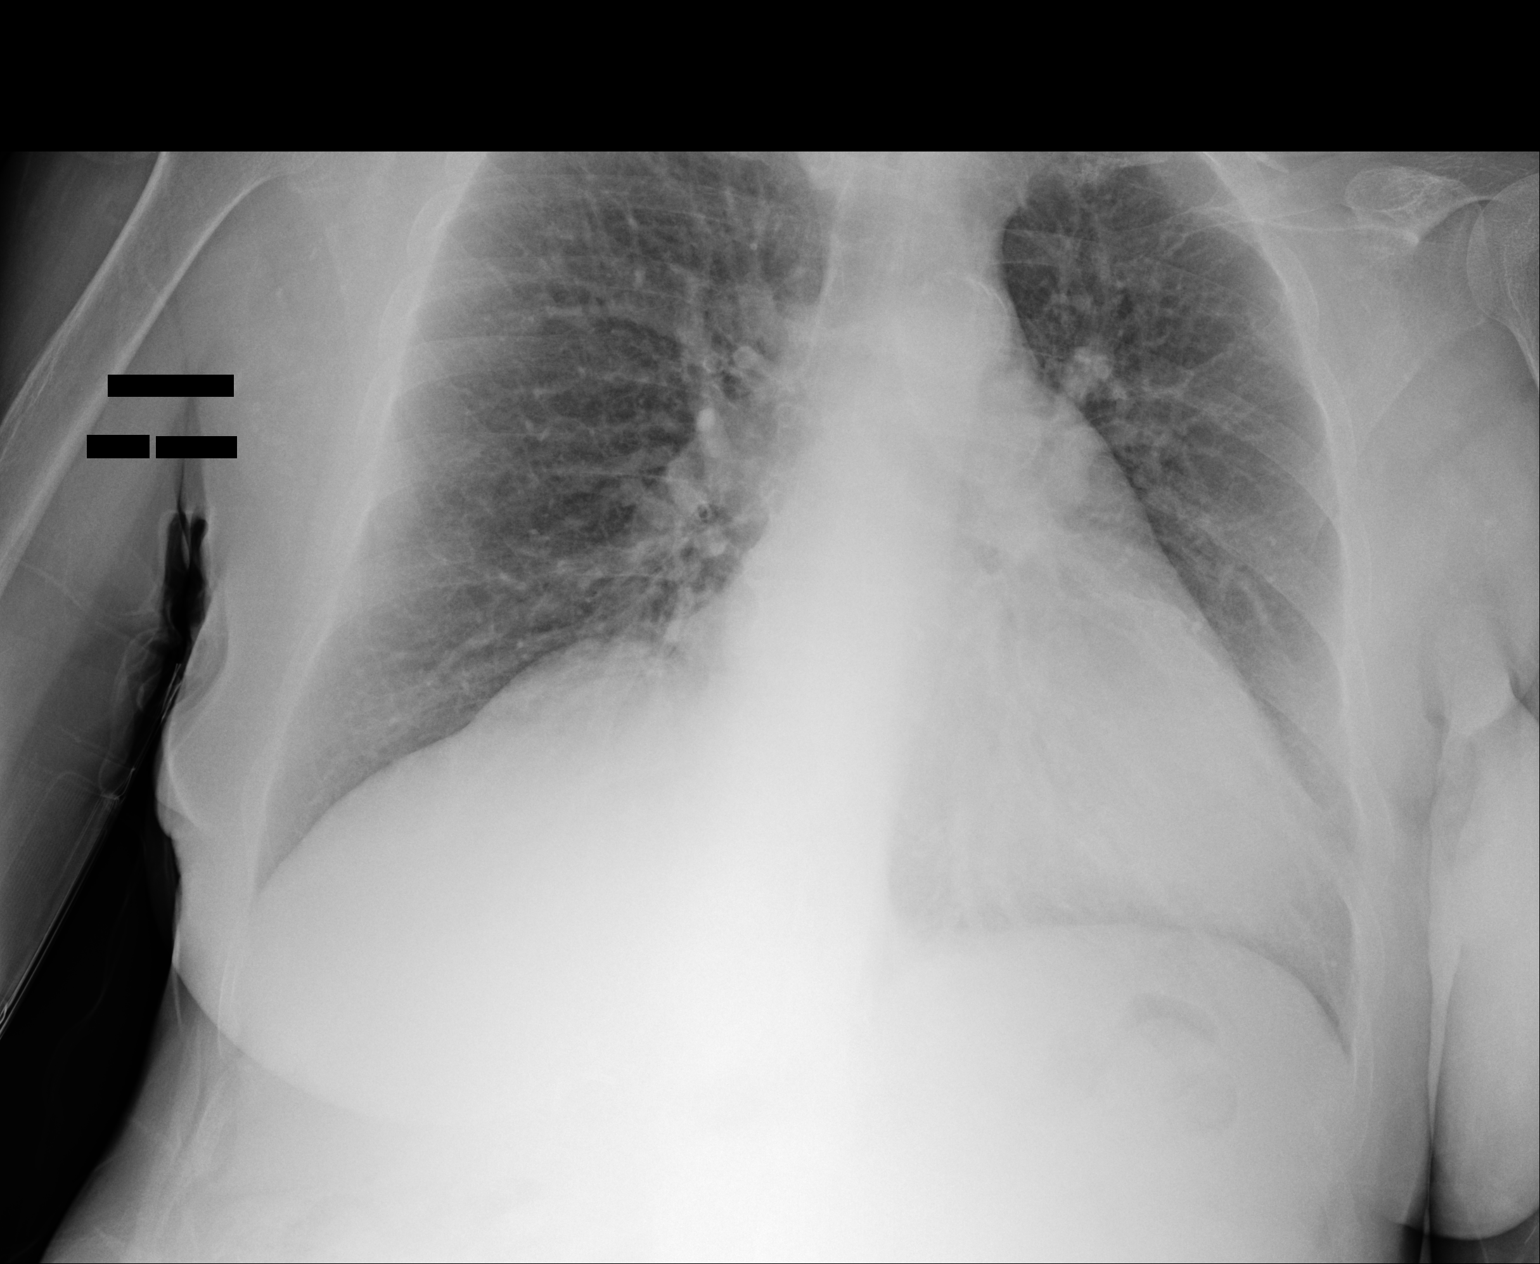

[1 of 1 positions shown; findings below may reference images not displayed]

FINDINGS: The heart is enlarged but stable. Moderate vascular congestion and
possible mild interstitial edema. No infiltrates or effusions. The
bony thorax is intact.
IMPRESSION: Cardiac enlargement and moderate vascular congestion with possible
mild interstitial edema. No infiltrates or effusions.

## 2019-06-09 DIAGNOSIS — N186 End stage renal disease: Secondary | ICD-10-CM | POA: Diagnosis not present

## 2019-06-09 DIAGNOSIS — Z992 Dependence on renal dialysis: Secondary | ICD-10-CM | POA: Diagnosis not present

## 2019-06-09 IMAGING — CR DG CHEST 1V PORT
1 series · 1 of 1 positions shown · non-contrast
Comparison: Yesterday.

CLINICAL DATA: Fever.

EXAM:
PORTABLE CHEST 1 VIEW

[portable]
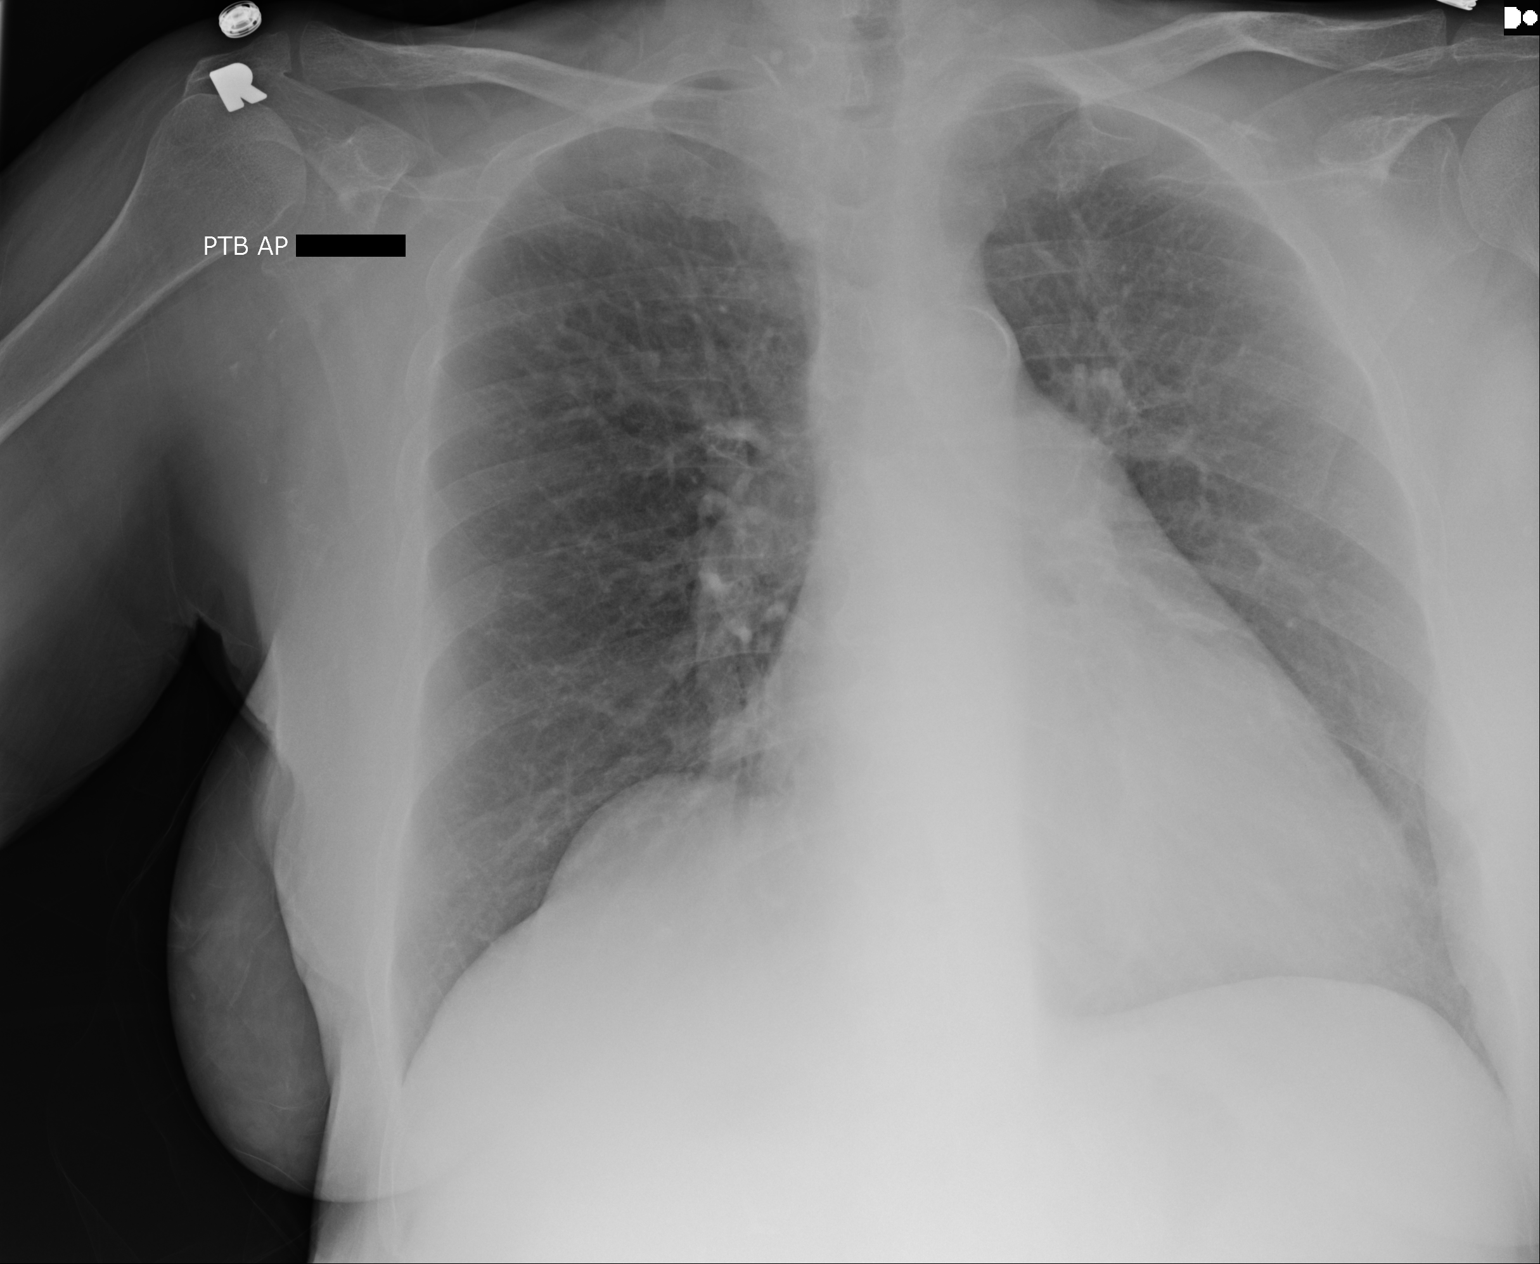

[1 of 1 positions shown; findings below may reference images not displayed]

FINDINGS: Stable enlarged cardiac silhouette. Mild decrease in prominence of
the pulmonary vasculature and interstitial markings. No pleural
fluid. No airspace consolidation. Unremarkable bones.
IMPRESSION: Stable cardiomegaly with improved pulmonary vascular congestion and
interstitial pulmonary edema.

## 2019-06-11 DIAGNOSIS — Z992 Dependence on renal dialysis: Secondary | ICD-10-CM | POA: Diagnosis not present

## 2019-06-11 DIAGNOSIS — N186 End stage renal disease: Secondary | ICD-10-CM | POA: Diagnosis not present

## 2019-06-13 DIAGNOSIS — Z992 Dependence on renal dialysis: Secondary | ICD-10-CM | POA: Diagnosis not present

## 2019-06-13 DIAGNOSIS — N186 End stage renal disease: Secondary | ICD-10-CM | POA: Diagnosis not present

## 2019-06-15 ENCOUNTER — Other Ambulatory Visit: Payer: Self-pay | Admitting: Family

## 2019-06-16 DIAGNOSIS — N186 End stage renal disease: Secondary | ICD-10-CM | POA: Diagnosis not present

## 2019-06-16 DIAGNOSIS — Z992 Dependence on renal dialysis: Secondary | ICD-10-CM | POA: Diagnosis not present

## 2019-06-18 DIAGNOSIS — Z992 Dependence on renal dialysis: Secondary | ICD-10-CM | POA: Diagnosis not present

## 2019-06-18 DIAGNOSIS — N186 End stage renal disease: Secondary | ICD-10-CM | POA: Diagnosis not present

## 2019-06-19 ENCOUNTER — Telehealth: Payer: Self-pay | Admitting: Family

## 2019-06-19 DIAGNOSIS — Z794 Long term (current) use of insulin: Secondary | ICD-10-CM

## 2019-06-19 DIAGNOSIS — E1165 Type 2 diabetes mellitus with hyperglycemia: Secondary | ICD-10-CM

## 2019-06-19 MED ORDER — TRULICITY 1.5 MG/0.5ML ~~LOC~~ SOAJ: SUBCUTANEOUS | 0 refills | Status: DC

## 2019-06-19 NOTE — Telephone Encounter (Signed)
Refill sent to pharmacy 3 mos appt set up for 07/08/19

## 2019-06-19 NOTE — Telephone Encounter (Signed)
  Prescription Request  06/19/2019  What is the name of the medication or equipment? Trulicity  Have you contacted your pharmacy to request a refill? (if applicable) Yes  Which pharmacy would you like this sent to? [facility]   Patient notified that their request is being sent to the clinical staff for review and that they should receive a response within 2 business days.   Lenna Gilford' pt.  She needs by Saturday.  It was called in on Monday.  Please call her.

## 2019-06-20 DIAGNOSIS — N186 End stage renal disease: Secondary | ICD-10-CM | POA: Diagnosis not present

## 2019-06-20 DIAGNOSIS — Z992 Dependence on renal dialysis: Secondary | ICD-10-CM | POA: Diagnosis not present

## 2019-06-23 DIAGNOSIS — Z992 Dependence on renal dialysis: Secondary | ICD-10-CM | POA: Diagnosis not present

## 2019-06-23 DIAGNOSIS — N186 End stage renal disease: Secondary | ICD-10-CM | POA: Diagnosis not present

## 2019-06-25 DIAGNOSIS — Z992 Dependence on renal dialysis: Secondary | ICD-10-CM | POA: Diagnosis not present

## 2019-06-25 DIAGNOSIS — N186 End stage renal disease: Secondary | ICD-10-CM | POA: Diagnosis not present

## 2019-06-27 DIAGNOSIS — Z992 Dependence on renal dialysis: Secondary | ICD-10-CM | POA: Diagnosis not present

## 2019-06-27 DIAGNOSIS — N186 End stage renal disease: Secondary | ICD-10-CM | POA: Diagnosis not present

## 2019-06-30 DIAGNOSIS — N186 End stage renal disease: Secondary | ICD-10-CM | POA: Diagnosis not present

## 2019-06-30 DIAGNOSIS — Z992 Dependence on renal dialysis: Secondary | ICD-10-CM | POA: Diagnosis not present

## 2019-07-02 DIAGNOSIS — N186 End stage renal disease: Secondary | ICD-10-CM | POA: Diagnosis not present

## 2019-07-02 DIAGNOSIS — Z992 Dependence on renal dialysis: Secondary | ICD-10-CM | POA: Diagnosis not present

## 2019-07-03 DIAGNOSIS — Z01818 Encounter for other preprocedural examination: Secondary | ICD-10-CM | POA: Diagnosis not present

## 2019-07-03 DIAGNOSIS — E785 Hyperlipidemia, unspecified: Secondary | ICD-10-CM | POA: Diagnosis not present

## 2019-07-03 DIAGNOSIS — Z7952 Long term (current) use of systemic steroids: Secondary | ICD-10-CM | POA: Diagnosis not present

## 2019-07-03 DIAGNOSIS — Z940 Kidney transplant status: Secondary | ICD-10-CM | POA: Diagnosis not present

## 2019-07-03 DIAGNOSIS — E1169 Type 2 diabetes mellitus with other specified complication: Secondary | ICD-10-CM | POA: Diagnosis not present

## 2019-07-03 DIAGNOSIS — Z79899 Other long term (current) drug therapy: Secondary | ICD-10-CM | POA: Diagnosis not present

## 2019-07-03 DIAGNOSIS — Z794 Long term (current) use of insulin: Secondary | ICD-10-CM | POA: Diagnosis not present

## 2019-07-03 DIAGNOSIS — D849 Immunodeficiency, unspecified: Secondary | ICD-10-CM | POA: Diagnosis not present

## 2019-07-03 DIAGNOSIS — I10 Essential (primary) hypertension: Secondary | ICD-10-CM | POA: Diagnosis not present

## 2019-07-03 DIAGNOSIS — Z4822 Encounter for aftercare following kidney transplant: Secondary | ICD-10-CM | POA: Diagnosis not present

## 2019-07-04 DIAGNOSIS — Z992 Dependence on renal dialysis: Secondary | ICD-10-CM | POA: Diagnosis not present

## 2019-07-04 DIAGNOSIS — N186 End stage renal disease: Secondary | ICD-10-CM | POA: Diagnosis not present

## 2019-07-07 DIAGNOSIS — N186 End stage renal disease: Secondary | ICD-10-CM | POA: Diagnosis not present

## 2019-07-07 DIAGNOSIS — Z992 Dependence on renal dialysis: Secondary | ICD-10-CM | POA: Diagnosis not present

## 2019-07-08 ENCOUNTER — Other Ambulatory Visit: Payer: Self-pay

## 2019-07-08 ENCOUNTER — Ambulatory Visit (INDEPENDENT_AMBULATORY_CARE_PROVIDER_SITE_OTHER): Payer: BC Managed Care – PPO | Admitting: Family

## 2019-07-08 ENCOUNTER — Encounter: Payer: Self-pay | Admitting: Family

## 2019-07-08 VITALS — BP 155/55 | HR 60 | Temp 97.9°F | Ht 69.0 in | Wt 153.2 lb

## 2019-07-08 DIAGNOSIS — E1169 Type 2 diabetes mellitus with other specified complication: Secondary | ICD-10-CM | POA: Diagnosis not present

## 2019-07-08 DIAGNOSIS — I10 Essential (primary) hypertension: Secondary | ICD-10-CM

## 2019-07-08 DIAGNOSIS — E1165 Type 2 diabetes mellitus with hyperglycemia: Secondary | ICD-10-CM

## 2019-07-08 DIAGNOSIS — Z794 Long term (current) use of insulin: Secondary | ICD-10-CM

## 2019-07-08 DIAGNOSIS — I5032 Chronic diastolic (congestive) heart failure: Secondary | ICD-10-CM

## 2019-07-08 DIAGNOSIS — R531 Weakness: Secondary | ICD-10-CM

## 2019-07-08 DIAGNOSIS — N186 End stage renal disease: Secondary | ICD-10-CM

## 2019-07-08 DIAGNOSIS — E785 Hyperlipidemia, unspecified: Secondary | ICD-10-CM

## 2019-07-08 DIAGNOSIS — I5031 Acute diastolic (congestive) heart failure: Secondary | ICD-10-CM

## 2019-07-08 DIAGNOSIS — R110 Nausea: Secondary | ICD-10-CM

## 2019-07-08 DIAGNOSIS — Z992 Dependence on renal dialysis: Secondary | ICD-10-CM

## 2019-07-08 DIAGNOSIS — D638 Anemia in other chronic diseases classified elsewhere: Secondary | ICD-10-CM

## 2019-07-08 MED ORDER — ROSUVASTATIN CALCIUM 20 MG PO TABS: 20.0000 mg | ORAL_TABLET | Freq: Every day | ORAL | 1 refills | Status: DC

## 2019-07-08 MED ORDER — ONDANSETRON HCL 4 MG PO TABS: 4.0000 mg | ORAL_TABLET | Freq: Three times a day (TID) | ORAL | 0 refills | Status: DC | PRN

## 2019-07-08 MED ORDER — METOPROLOL SUCCINATE ER 25 MG PO TB24: 25.0000 mg | ORAL_TABLET | Freq: Every day | ORAL | 3 refills | Status: DC

## 2019-07-08 MED ORDER — CLONIDINE HCL 0.1 MG PO TABS: 0.1000 mg | ORAL_TABLET | Freq: Every day | ORAL | 3 refills | Status: DC

## 2019-07-08 MED ORDER — TRESIBA FLEXTOUCH 100 UNIT/ML ~~LOC~~ SOPN: 15.0000 [IU] | PEN_INJECTOR | Freq: Every day | SUBCUTANEOUS | 2 refills | Status: DC

## 2019-07-08 MED ORDER — TRULICITY 1.5 MG/0.5ML ~~LOC~~ SOAJ: SUBCUTANEOUS | 0 refills | Status: DC

## 2019-07-08 MED ORDER — ISOSORBIDE MONONITRATE ER 60 MG PO TB24: 60.0000 mg | ORAL_TABLET | Freq: Every day | ORAL | 3 refills | Status: DC

## 2019-07-08 NOTE — Patient Instructions (Signed)

## 2019-07-08 NOTE — Progress Notes (Signed)
Subjective:    Patient ID: [name], female    DOB: 30-Jun-1955, 64 y.o.   MRN: [id number]  Chief Complaint  Patient presents with  . Medical Management of Chronic Issues   PT presents to the office todayfor chronic follow up.Pt is followed by Cardiologists annually for CHF and aortic valve sclerosis that is stable.   Pt currently on dialysis Monday, Wednesday, and Friday and followed by nephrologists there. 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 Diabetes She presents for her follow-up diabetic visit. She has type 2 diabetes mellitus. There are no hypoglycemic associated symptoms. Associated symptoms include blurred vision and foot paresthesias. Symptoms are stable. Diabetic complications include heart disease, nephropathy and peripheral neuropathy. Risk factors for coronary artery disease include dyslipidemia, diabetes mellitus, post-menopausal and sedentary lifestyle. She is following a generally unhealthy diet. Her overall blood glucose range is >200 mg/dl. An ACE inhibitor/angiotensin II receptor blocker is contraindicated. Eye exam is not current.  Hypertension This is a chronic problem. The current episode started more than 1 year ago. The problem has been waxing and waning since onset. Associated symptoms include blurred vision and malaise/fatigue. Pertinent negatives include no peripheral edema or shortness of breath. Risk factors for coronary artery disease include dyslipidemia, diabetes mellitus and sedentary lifestyle. The current treatment provides moderate improvement.  Hyperlipidemia This is a chronic problem. The current episode started more than 1 year ago. Pertinent negatives include no shortness of breath. Current antihyperlipidemic treatment includes statins. The current treatment provides moderate improvement of lipids. Risk factors for coronary artery disease include  dyslipidemia, diabetes mellitus, hypertension and a sedentary lifestyle.      Review of Systems  Constitutional: Positive for malaise/fatigue.  Eyes: Positive for blurred vision.  Respiratory: Negative for shortness of breath.   All other systems reviewed and are negative.      Objective:   Physical Exam Vitals reviewed.  Constitutional:      General: She is not in acute distress.    Appearance: She is well-developed.  HENT:     Head: Normocephalic and atraumatic.     Right Ear: Tympanic membrane normal.     Left Ear: Tympanic membrane normal.  Eyes:     Pupils: Pupils are equal, round, and reactive to light.  Neck:     Thyroid: No thyromegaly.  Cardiovascular:     Rate and Rhythm: Normal rate and regular rhythm.     Heart sounds: Murmur present.  Pulmonary:     Effort: Pulmonary effort is normal. No respiratory distress.     Breath sounds: Normal breath sounds. No wheezing.  Abdominal:     General: Bowel sounds are normal. There is no distension.     Palpations: Abdomen is soft.     Tenderness: There is no abdominal tenderness.  Musculoskeletal:        General: No tenderness. Normal range of motion.     Cervical back: Normal range of motion and neck supple.  Skin:    General: Skin is warm and dry.     Findings: Bruising present.  Neurological:     Mental Status: She is alert and oriented to person, place, and time.     Cranial Nerves: No cranial nerve deficit.     Deep Tendon Reflexes: Reflexes are normal and symmetric.  Psychiatric:        Behavior: Behavior normal.  Thought Content: Thought content normal.        Judgment: Judgment normal.     BP (!) 155/55   Pulse 60   Temp 97.9 F (36.6 C) (Temporal)   Ht 5\' 9"  (1.753 m)   Wt 153 lb 3.2 oz (69.5 kg)   SpO2 100%   BMI 22.62 kg/m        Assessment & Plan:  KASIDI SHANKER comes in today with chief complaint of Medical Management of Chronic Issues   Diagnosis and orders addressed:  1.  Hyperlipidemia associated with type 2 diabetes mellitus (HCC) - rosuvastatin (CRESTOR) 20 MG tablet; Take 1 tablet (20 mg total) by mouth daily.  Dispense: 90 tablet; Refill: 1  2. Type 2 diabetes mellitus with hyperglycemia, with long-term current use of insulin (HCC) -Will increase Tresiba to 15 units from 10 units Strict low carb diet  - Dulaglutide (TRULICITY) 1.5 EZ/6.6QH SOPN; INJECT 1.5MG   SUBCUTANEOUSLY ONCE A WEEK  Dispense: 12 mL; Refill: 0 - insulin degludec (TRESIBA FLEXTOUCH) 100 UNIT/ML FlexTouch Pen; Inject 0.15 mLs (15 Units total) into the skin daily.  Dispense: 12 pen; Refill: 2  3. Acute diastolic CHF (congestive heart failure) ([place])  4. Chronic diastolic heart failure ([place])  5. Essential hypertension - cloNIDine (CATAPRES) 0.1 MG tablet; Take 1 tablet (0.1 mg total) by mouth daily.  Dispense: 60 tablet; Refill: 3 - metoprolol succinate (TOPROL-XL) 25 MG 24 hr tablet; Take 1 tablet (25 mg total) by mouth daily.  Dispense: 90 tablet; Refill: 3  6. Chronic kidney disease on chronic dialysis ([place])  7. Anemia of chronic disease  8. Generalized weakness  9. Nausea  - ondansetron (ZOFRAN) 4 MG tablet; Take 1 tablet (4 mg total) by mouth every 8 (eight) hours as needed for nausea or vomiting.  Dispense: 20 tablet; Refill: 0   Labs pending Health Maintenance reviewed Diet and exercise encouraged  Follow up plan: 3 months    Evelina Dun, FNP

## 2019-07-09 DIAGNOSIS — Z992 Dependence on renal dialysis: Secondary | ICD-10-CM | POA: Diagnosis not present

## 2019-07-09 DIAGNOSIS — N186 End stage renal disease: Secondary | ICD-10-CM | POA: Diagnosis not present

## 2019-07-11 DIAGNOSIS — N186 End stage renal disease: Secondary | ICD-10-CM | POA: Diagnosis not present

## 2019-07-11 DIAGNOSIS — Z992 Dependence on renal dialysis: Secondary | ICD-10-CM | POA: Diagnosis not present

## 2019-07-14 DIAGNOSIS — N186 End stage renal disease: Secondary | ICD-10-CM | POA: Diagnosis not present

## 2019-07-14 DIAGNOSIS — Z992 Dependence on renal dialysis: Secondary | ICD-10-CM | POA: Diagnosis not present

## 2019-07-16 DIAGNOSIS — Z992 Dependence on renal dialysis: Secondary | ICD-10-CM | POA: Diagnosis not present

## 2019-07-16 DIAGNOSIS — N186 End stage renal disease: Secondary | ICD-10-CM | POA: Diagnosis not present

## 2019-07-18 DIAGNOSIS — Z992 Dependence on renal dialysis: Secondary | ICD-10-CM | POA: Diagnosis not present

## 2019-07-18 DIAGNOSIS — N186 End stage renal disease: Secondary | ICD-10-CM | POA: Diagnosis not present

## 2019-07-20 ENCOUNTER — Observation Stay (HOSPITAL_COMMUNITY)
Admission: EM | Admit: 2019-07-20 | Discharge: 2019-07-21 | Disposition: A | Discharge status: Home / Self Care | Payer: BC Managed Care – PPO | Attending: Internal Medicine | Admitting: Internal Medicine

## 2019-07-20 ENCOUNTER — Emergency Department (HOSPITAL_COMMUNITY): Payer: BC Managed Care – PPO

## 2019-07-20 ENCOUNTER — Other Ambulatory Visit: Payer: Self-pay

## 2019-07-20 ENCOUNTER — Encounter (HOSPITAL_COMMUNITY): Payer: Self-pay | Admitting: Emergency Medicine

## 2019-07-20 DIAGNOSIS — J189 Pneumonia, unspecified organism: Principal | ICD-10-CM | POA: Insufficient documentation

## 2019-07-20 DIAGNOSIS — D631 Anemia in chronic kidney disease: Secondary | ICD-10-CM | POA: Insufficient documentation

## 2019-07-20 DIAGNOSIS — Z992 Dependence on renal dialysis: Secondary | ICD-10-CM | POA: Diagnosis not present

## 2019-07-20 DIAGNOSIS — N2581 Secondary hyperparathyroidism of renal origin: Secondary | ICD-10-CM | POA: Insufficient documentation

## 2019-07-20 DIAGNOSIS — I132 Hypertensive heart and chronic kidney disease with heart failure and with stage 5 chronic kidney disease, or end stage renal disease: Secondary | ICD-10-CM | POA: Insufficient documentation

## 2019-07-20 DIAGNOSIS — E1122 Type 2 diabetes mellitus with diabetic chronic kidney disease: Secondary | ICD-10-CM | POA: Insufficient documentation

## 2019-07-20 DIAGNOSIS — Z940 Kidney transplant status: Secondary | ICD-10-CM | POA: Diagnosis not present

## 2019-07-20 DIAGNOSIS — I5032 Chronic diastolic (congestive) heart failure: Secondary | ICD-10-CM | POA: Diagnosis not present

## 2019-07-20 DIAGNOSIS — Z794 Long term (current) use of insulin: Secondary | ICD-10-CM | POA: Diagnosis not present

## 2019-07-20 DIAGNOSIS — Z20822 Contact with and (suspected) exposure to covid-19: Secondary | ICD-10-CM | POA: Insufficient documentation

## 2019-07-20 DIAGNOSIS — E119 Type 2 diabetes mellitus without complications: Secondary | ICD-10-CM

## 2019-07-20 DIAGNOSIS — N186 End stage renal disease: Secondary | ICD-10-CM | POA: Insufficient documentation

## 2019-07-20 DIAGNOSIS — R0602 Shortness of breath: Secondary | ICD-10-CM | POA: Diagnosis not present

## 2019-07-20 DIAGNOSIS — I10 Essential (primary) hypertension: Secondary | ICD-10-CM | POA: Diagnosis present

## 2019-07-20 LAB — CBC WITH DIFFERENTIAL/PLATELET
Abs Immature Granulocytes: 0.04 10*3/uL (ref 0.00–0.07)
Basophils Absolute: 0.1 10*3/uL (ref 0.0–0.1)
Basophils Relative: 1 %
Eosinophils Absolute: 0.6 10*3/uL — ABNORMAL HIGH (ref 0.0–0.5)
Eosinophils Relative: 5 %
HCT: 35 % — ABNORMAL LOW (ref 36.0–46.0)
Hemoglobin: 10.3 g/dL — ABNORMAL LOW (ref 12.0–15.0)
Immature Granulocytes: 0 %
Lymphocytes Relative: 9 %
Lymphs Abs: 1 10*3/uL (ref 0.7–4.0)
MCH: 28.7 pg (ref 26.0–34.0)
MCHC: 29.4 g/dL — ABNORMAL LOW (ref 30.0–36.0)
MCV: 97.5 fL (ref 80.0–100.0)
Monocytes Absolute: 0.5 10*3/uL (ref 0.1–1.0)
Monocytes Relative: 4 %
Neutro Abs: 9.7 10*3/uL — ABNORMAL HIGH (ref 1.7–7.7)
Neutrophils Relative %: 81 %
Platelets: 212 10*3/uL (ref 150–400)
RBC: 3.59 MIL/uL — ABNORMAL LOW (ref 3.87–5.11)
RDW: 15.2 % (ref 11.5–15.5)
WBC: 12 10*3/uL — ABNORMAL HIGH (ref 4.0–10.5)
nRBC: 0 % (ref 0.0–0.2)

## 2019-07-20 LAB — TROPONIN I (HIGH SENSITIVITY)
Troponin I (High Sensitivity): 68 ng/L — ABNORMAL HIGH (ref <18)
Troponin I (High Sensitivity): 67 ng/L — ABNORMAL HIGH (ref <18)

## 2019-07-20 LAB — COMPREHENSIVE METABOLIC PANEL
ALT: 22 U/L (ref 0–44)
AST: 53 U/L — ABNORMAL HIGH (ref 15–41)
Albumin: 3.2 g/dL — ABNORMAL LOW (ref 3.5–5.0)
Alkaline Phosphatase: 89 U/L (ref 38–126)
Anion gap: 11 (ref 5–15)
BUN: 34 mg/dL — ABNORMAL HIGH (ref 8–23)
CO2: 28 mmol/L (ref 22–32)
Calcium: 8.6 mg/dL — ABNORMAL LOW (ref 8.9–10.3)
Chloride: 99 mmol/L (ref 98–111)
Creatinine, Ser: 7.36 mg/dL — ABNORMAL HIGH (ref 0.44–1.00)
GFR calc Af Amer: 6 mL/min — ABNORMAL LOW (ref >60)
GFR calc non Af Amer: 5 mL/min — ABNORMAL LOW (ref >60)
Glucose, Bld: 96 mg/dL (ref 70–99)
Potassium: 6 mmol/L — ABNORMAL HIGH (ref 3.5–5.1)
Sodium: 138 mmol/L (ref 135–145)
Total Bilirubin: 1.2 mg/dL (ref 0.3–1.2)
Total Protein: 7.2 g/dL (ref 6.5–8.1)

## 2019-07-20 LAB — I-STAT CHEM 8, ED
BUN: 32 mg/dL — ABNORMAL HIGH (ref 8–23)
Calcium, Ion: 1.11 mmol/L — ABNORMAL LOW (ref 1.15–1.40)
Chloride: 100 mmol/L (ref 98–111)
Creatinine, Ser: 7.4 mg/dL — ABNORMAL HIGH (ref 0.44–1.00)
Glucose, Bld: 89 mg/dL (ref 70–99)
HCT: 31 % — ABNORMAL LOW (ref 36.0–46.0)
Hemoglobin: 10.5 g/dL — ABNORMAL LOW (ref 12.0–15.0)
Potassium: 5.6 mmol/L — ABNORMAL HIGH (ref 3.5–5.1)
Sodium: 139 mmol/L (ref 135–145)
TCO2: 28 mmol/L (ref 22–32)

## 2019-07-20 LAB — SARS CORONAVIRUS 2 BY RT PCR (HOSPITAL ORDER, PERFORMED IN ~~LOC~~ HOSPITAL LAB): SARS Coronavirus 2: NEGATIVE

## 2019-07-20 LAB — MRSA PCR SCREENING: MRSA by PCR: NEGATIVE

## 2019-07-20 LAB — LACTIC ACID, PLASMA: Lactic Acid, Venous: 1.7 mmol/L (ref 0.5–1.9)

## 2019-07-20 LAB — GLUCOSE, CAPILLARY: Glucose-Capillary: 133 mg/dL — ABNORMAL HIGH (ref 70–99)

## 2019-07-20 MED ORDER — INSULIN GLARGINE 100 UNIT/ML ~~LOC~~ SOLN
8.0000 [IU] | Freq: Every day | SUBCUTANEOUS | Status: DC
  Administered 2019-07-20: 8 [IU] via SUBCUTANEOUS
  Filled 2019-07-20 (×2): qty 0.08
  Filled 2019-07-20: qty 1

## 2019-07-20 MED ORDER — PREDNISONE 10 MG PO TABS
5.0000 mg | ORAL_TABLET | Freq: Every day | ORAL | Status: DC
  Administered 2019-07-20 – 2019-07-21 (×2): 5 mg via ORAL
  Filled 2019-07-20 (×2): qty 1

## 2019-07-20 MED ORDER — SODIUM CHLORIDE 0.9 % IV SOLN
1.0000 g | INTRAVENOUS | Status: DC
  Administered 2019-07-21: 1 g via INTRAVENOUS
  Filled 2019-07-20: qty 10

## 2019-07-20 MED ORDER — ASPIRIN EC 81 MG PO TBEC
81.0000 mg | DELAYED_RELEASE_TABLET | Freq: Every day | ORAL | Status: DC
  Filled 2019-07-20 (×2): qty 1

## 2019-07-20 MED ORDER — ACETAMINOPHEN 325 MG PO TABS: 650.0000 mg | ORAL_TABLET | Freq: Four times a day (QID) | ORAL | Status: DC | PRN

## 2019-07-20 MED ORDER — METOCLOPRAMIDE HCL 5 MG/ML IJ SOLN
10.0000 mg | Freq: Once | INTRAMUSCULAR | Status: AC
  Administered 2019-07-20: 10 mg via INTRAVENOUS
  Filled 2019-07-20: qty 2

## 2019-07-20 MED ORDER — INSULIN ASPART 100 UNIT/ML ~~LOC~~ SOLN: 0.0000 [IU] | Freq: Every day | SUBCUTANEOUS | Status: DC

## 2019-07-20 MED ORDER — SODIUM ZIRCONIUM CYCLOSILICATE 5 G PO PACK
10.0000 g | PACK | Freq: Once | ORAL | Status: AC
  Administered 2019-07-20: 10 g via ORAL
  Filled 2019-07-20: qty 2

## 2019-07-20 MED ORDER — ISOSORBIDE MONONITRATE ER 60 MG PO TB24
60.0000 mg | ORAL_TABLET | Freq: Every day | ORAL | Status: DC
  Administered 2019-07-20: 60 mg via ORAL
  Filled 2019-07-20 (×4): qty 1

## 2019-07-20 MED ORDER — ONDANSETRON HCL 4 MG PO TABS: 4.0000 mg | ORAL_TABLET | Freq: Four times a day (QID) | ORAL | Status: DC | PRN

## 2019-07-20 MED ORDER — VANCOMYCIN HCL IN DEXTROSE 500-5 MG/100ML-% IV SOLN
500.0000 mg | INTRAVENOUS | Status: DC
  Filled 2019-07-20: qty 100

## 2019-07-20 MED ORDER — SODIUM CHLORIDE 0.9 % IV SOLN
2.0000 g | Freq: Once | INTRAVENOUS | Status: AC
  Administered 2019-07-20: 2 g via INTRAVENOUS

## 2019-07-20 MED ORDER — HEPARIN SODIUM (PORCINE) 5000 UNIT/ML IJ SOLN
5000.0000 [IU] | Freq: Three times a day (TID) | INTRAMUSCULAR | Status: DC
  Administered 2019-07-20 – 2019-07-21 (×3): 5000 [IU] via SUBCUTANEOUS
  Filled 2019-07-20 (×3): qty 1

## 2019-07-20 MED ORDER — TACROLIMUS 0.5 MG PO CAPS
0.5000 mg | ORAL_CAPSULE | Freq: Every day | ORAL | Status: DC
  Administered 2019-07-20: 0.5 mg via ORAL
  Filled 2019-07-20 (×2): qty 1

## 2019-07-20 MED ORDER — ROSUVASTATIN CALCIUM 20 MG PO TABS
20.0000 mg | ORAL_TABLET | Freq: Every day | ORAL | Status: DC
  Administered 2019-07-20: 20 mg via ORAL
  Filled 2019-07-20 (×2): qty 1

## 2019-07-20 MED ORDER — SEVELAMER CARBONATE 800 MG PO TABS
1600.0000 mg | ORAL_TABLET | Freq: Every day | ORAL | Status: DC
  Administered 2019-07-20: 3200 mg via ORAL
  Filled 2019-07-20: qty 4

## 2019-07-20 MED ORDER — METOPROLOL SUCCINATE ER 25 MG PO TB24
25.0000 mg | ORAL_TABLET | Freq: Every day | ORAL | Status: DC
  Administered 2019-07-20: 25 mg via ORAL
  Filled 2019-07-20 (×2): qty 1

## 2019-07-20 MED ORDER — GUAIFENESIN-DM 100-10 MG/5ML PO SYRP
10.0000 mL | ORAL_SOLUTION | Freq: Three times a day (TID) | ORAL | Status: AC
  Administered 2019-07-20 – 2019-07-21 (×3): 10 mL via ORAL
  Filled 2019-07-20 (×3): qty 10

## 2019-07-20 MED ORDER — INSULIN DEGLUDEC 100 UNIT/ML ~~LOC~~ SOPN
8.0000 [IU] | PEN_INJECTOR | Freq: Every day | SUBCUTANEOUS | Status: DC
  Filled 2019-07-20: qty 3

## 2019-07-20 MED ORDER — VANCOMYCIN HCL IN DEXTROSE 1-5 GM/200ML-% IV SOLN
1000.0000 mg | Freq: Once | INTRAVENOUS | Status: AC
  Administered 2019-07-20: 1000 mg via INTRAVENOUS
  Filled 2019-07-20: qty 200

## 2019-07-20 MED ORDER — CLONIDINE HCL 0.1 MG PO TABS
0.1000 mg | ORAL_TABLET | Freq: Every day | ORAL | Status: DC
  Administered 2019-07-20: 0.1 mg via ORAL
  Filled 2019-07-20 (×2): qty 1

## 2019-07-20 MED ORDER — ACETAMINOPHEN 650 MG RE SUPP: 650.0000 mg | Freq: Four times a day (QID) | RECTAL | Status: DC | PRN

## 2019-07-20 MED ORDER — SODIUM CHLORIDE 0.9 % IV SOLN
1.0000 g | INTRAVENOUS | Status: DC
  Filled 2019-07-20: qty 1

## 2019-07-20 MED ORDER — IPRATROPIUM-ALBUTEROL 0.5-2.5 (3) MG/3ML IN SOLN
3.0000 mL | Freq: Once | RESPIRATORY_TRACT | Status: AC
  Administered 2019-07-20: 3 mL via RESPIRATORY_TRACT
  Filled 2019-07-20: qty 3

## 2019-07-20 MED ORDER — POLYETHYLENE GLYCOL 3350 17 G PO PACK: 17.0000 g | PACK | Freq: Every day | ORAL | Status: DC | PRN

## 2019-07-20 MED ORDER — SODIUM CHLORIDE 0.9 % IV SOLN
100.0000 mg | Freq: Two times a day (BID) | INTRAVENOUS | Status: DC
  Administered 2019-07-20 – 2019-07-21 (×2): 100 mg via INTRAVENOUS
  Filled 2019-07-20 (×6): qty 100

## 2019-07-20 MED ORDER — ONDANSETRON HCL 4 MG/2ML IJ SOLN: 4.0000 mg | Freq: Four times a day (QID) | INTRAMUSCULAR | Status: DC | PRN

## 2019-07-20 MED ORDER — INSULIN ASPART 100 UNIT/ML ~~LOC~~ SOLN
0.0000 [IU] | Freq: Three times a day (TID) | SUBCUTANEOUS | Status: DC
  Administered 2019-07-21 (×3): 2 [IU] via SUBCUTANEOUS

## 2019-07-20 NOTE — H&P (Addendum)
History and Physical    [name] EUM:[id number] DOB: 1955/12/23 DOA: 07/20/2019  PCP: Sharion Balloon, FNP   Patient coming from: Home  I have personally briefly reviewed patient's old medical records in [place]  Chief Complaint: difficulty breathing, chest pain, cough  HPI: [name] is a 64 y.o. female with medical history significant for ESRD, diabetes mellitus, coronary artery disease, diastolic CHF, hypertension. Patient presented to the ED with complaints of difficulty breathing that started this morning when she woke up, she also reports chest pain-nonradiating, left-sided, that started about 11 AM this morning, and persisted till she got to the ED. She describes pain as pressure-like. She is unaware of a history of heart attacks. She reports onset of a dry cough that started yesterday. She denies lower extremity swelling. She has been consistent with her HD schedule of Monday Wednesday Friday has been completing her sessions. Last hemodialysis was Friday. Patient has completed her COVID-19 vaccination. At this time patient is chest pain-free.  ED Course: T-max 99.3, blood pressure 195/60, O2 sats 95% on room air.  WBC 12. Covid PCR test negative. Portable chest x-ray with prominent interstitial markings bilaterally with patchy opacity in right midlung-asymmetric edema versus atypical/viral infection. K elevated at 6. Lactic acid within normal limits 1.7. Troponin 68 > 67, consistent with prior. EKG shows some ST depressions in lateral leads-V5 V6 that improved on subsequent EKG. Patient was started on IV Vanco and cefepime for broad coverage, due to immunocompromised status. Hospitalist to admit for pneumonia.  Review of Systems: As per HPI all other systems reviewed and negative.  Past Medical History:  Diagnosis Date  . Anemia    of chronic disease  . Blood transfusion without reported diagnosis   . Cardiovascular disease    nonobstructive  . Carotid artery  stenosis 2008  . CHF (congestive heart failure) ([place])   . Coronary artery disease   . Diabetes mellitus   . Dyslipidemia   . Edema of lower extremity    with hypo-albuminemia and profound protenuria  . Heart murmur   . Hypertension   . Mitral regurgitation   . Nephrotic syndrome   . Patent foramen ovale   . Pulmonary hypertension, moderate to severe ([place])   . Pulmonary nodule   . Tricuspid regurgitation   . Volume depletion, renal, due to output loss (renal deficit)     Past Surgical History:  Procedure Laterality Date  . A/V FISTULAGRAM N/A 04/05/2017   Procedure: A/V FISTULAGRAM - Left Arm;  Surgeon: Angelia Mould, MD;  Location: [facility];  Service: Cardiovascular;  Laterality: N/A;  . A/V FISTULAGRAM Left 09/18/2017   Procedure: A/V FISTULAGRAM;  Surgeon: Serafina Mitchell, MD;  Location: [facility];  Service: Cardiovascular;  Laterality: Left;  . A/V SHUNT INTERVENTION Left 04/05/2017   Procedure: A/V SHUNT INTERVENTION;  Surgeon: Angelia Mould, MD;  Location: [facility];  Service: Cardiovascular;  Laterality: Left;  . CARDIAC CATHETERIZATION  2008  . IR REMOVAL TUN CV CATH W/O FL  11/02/2016  . 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 PERIPHERAL VASCULAR BALLOON ANGIOPLASTY Left 09/18/2017   Procedure: PERIPHERAL VASCULAR BALLOON ANGIOPLASTY;  Surgeon: Serafina Mitchell, MD;  Location: [facility];  Service: Cardiovascular;  Laterality: Left;  Arm Fistula     reports that she has never smoked. She has never used smokeless tobacco. She reports that she does not drink alcohol or use drugs.  Allergies  Allergen Reactions  . Other Other (See Comments)    Tape Bruises and tears skin, Paper tape tolerated  . Tape Other (See Comments)    Bruises and tears skin Paper tape tolerated Bruises and tears skin Paper tape tolerated Bruises and tears skin Paper tape tolerated    Family History  Problem Relation Age of Onset  . Kidney  disease Mother   . Diabetes Mother   . Diabetes Father   . Heart disease Father     Prior to Admission medications   Medication Sig Start Date End Date Taking? Authorizing Provider  acetaminophen (TYLENOL) 500 MG tablet Take 500 mg by mouth every 6 (six) hours as needed for moderate pain or headache.  04/06/16  Yes [provider]  aspirin (ASPIR-LOW) 81 MG EC tablet Take 1 tablet (81 mg total) by mouth daily with breakfast. 05/27/19  Yes Almendra Loria, [place], MD  B Complex-C-Folic Acid (RENA-VITE RX) 1 MG TABS Take 1 tablet by mouth daily. 09/24/18  Yes [provider]  cloNIDine (CATAPRES) 0.1 MG tablet Take 1 tablet (0.1 mg total) by mouth daily. 07/08/19  Yes Hawks, Christy A, FNP  insulin degludec (TRESIBA FLEXTOUCH) 100 UNIT/ML FlexTouch Pen Inject 0.15 mLs (15 Units total) into the skin daily. 07/08/19  Yes Hawks, Christy A, FNP  isosorbide mononitrate (IMDUR) 60 MG 24 hr tablet Take 1 tablet (60 mg total) by mouth daily. 07/08/19  Yes Hawks, Christy A, FNP  metoprolol succinate (TOPROL-XL) 25 MG 24 hr tablet Take 1 tablet (25 mg total) by mouth daily. 07/08/19  Yes Hawks, Christy A, FNP  ondansetron (ZOFRAN) 4 MG tablet Take 1 tablet (4 mg total) by mouth every 8 (eight) hours as needed for nausea or vomiting. 07/08/19  Yes Hawks, Christy A, FNP  PROGRAF 0.5 MG capsule Take 0.5 mg by mouth daily. Take 0.5mg  by mouth each morning. 10/24/16  Yes [provider]  rosuvastatin (CRESTOR) 20 MG tablet Take 1 tablet (20 mg total) by mouth daily. 07/08/19  Yes Hawks, Christy A, FNP  senna (SENOKOT) 8.6 MG TABS tablet Take 1 tablet (8.6 mg total) by mouth 2 (two) times daily. 05/27/19  Yes Roxan Hockey, MD  sevelamer carbonate (RENVELA) 800 MG tablet Take 1,600-3,200 mg by mouth 5 (five) times daily. Patient takes 4 tablets(3200mg ) 3 times a day with meals and 2 tablets(1600mg ) 2 times a day with snacks 10/02/16  Yes [provider]  Dulaglutide (TRULICITY) 1.5 OJ/5.0KX SOPN  INJECT 1.5MG   SUBCUTANEOUSLY ONCE A WEEK 07/08/19   Sharion Balloon, FNP    Physical Exam: Vitals:   07/20/19 1300 07/20/19 1312 07/20/19 1324 07/20/19 1430  BP: (!) 195/60 (!) 195/60 (!) 195/60   Pulse: 69 72 71 76  Resp: 20 20 16 18   Temp:   99.3 F (37.4 C)   TempSrc:   Oral   SpO2: 96% 95% 96% 98%  Weight:      Height:        Constitutional: NAD, calm, comfortable Vitals:   07/20/19 1300 07/20/19 1312 07/20/19 1324 07/20/19 1430  BP: (!) 195/60 (!) 195/60 (!) 195/60   Pulse: 69 72 71 76  Resp: 20 20 16 18   Temp:   99.3 F (37.4 C)   TempSrc:   Oral   SpO2: 96% 95% 96% 98%  Weight:      Height:       Eyes: PERRL, lids and conjunctivae normal ENMT: Mucous membranes are moist.  Neck: normal, supple, no masses, no thyromegaly Respiratory:  Normal respiratory effort. No accessory muscle use.  Cardiovascular: Regular rate and rhythm, No extremity edema. 2+ pedal pulses. No carotid bruits.  Abdomen: no tenderness, no masses palpated. No hepatosplenomegaly. Bowel sounds positive.  Musculoskeletal: no clubbing / cyanosis. No joint deformity upper and lower extremities. Good ROM, no contractures.  Skin: no rashes, lesions, ulcers. No induration Neurologic: No apparent cranial nerve abnormality, moving all extremities spontaneously. Psychiatric: Normal judgment and insight. Alert and oriented x 3. Normal mood.   Labs on Admission: I have personally reviewed following labs and imaging studies  CBC: Recent Labs  Lab 07/20/19 1346 07/20/19 1354  WBC 12.0*  --   NEUTROABS 9.7*  --   HGB 10.3* 10.5*  HCT 35.0* 31.0*  MCV 97.5  --   PLT 212  --    Basic Metabolic Panel: Recent Labs  Lab 07/20/19 1328 07/20/19 1354  NA 138 139  K 6.0* 5.6*  CL 99 100  CO2 28  --   GLUCOSE 96 89  BUN 34* 32*  CREATININE 7.36* 7.40*  CALCIUM 8.6*  --    Liver Function Tests: Recent Labs  Lab 07/20/19 1328  AST 53*  ALT 22  ALKPHOS 89  BILITOT 1.2  PROT 7.2  ALBUMIN 3.2*     Radiological Exams on Admission: DG Chest Port 1 View  Result Date: 07/20/2019 CLINICAL DATA:  Shortness of breath EXAM: PORTABLE CHEST 1 VIEW COMPARISON:  05/26/2019 FINDINGS: Stable mild cardiomegaly. Atherosclerotic calcification of the aortic knob. Prominent interstitial markings bilaterally with patchy opacity in the right mid lung. No pleural effusion or pneumothorax. No acute osseous findings. IMPRESSION: Prominent interstitial markings bilaterally with patchy opacity in the right mid lung. Findings may reflect asymmetric edema versus atypical/viral infection. Electronically Signed   By: Davina Poke D.O.   On: 07/20/2019 13:51    EKG: Independently reviewed. Borderline prolonged QTc of 489, old T wave inversions in lead III and aVF V6. Mild new ST depressions in V5 and V6, improved on subsequent EKG.  Assessment/Plan Principal Problem:   Pneumonia Active Problems:   Essential hypertension   Chronic diastolic heart failure (HCC)   Deceased-donor kidney transplant recipient   Type 2 diabetes mellitus (HCC)   Pneumonia- T-max 99.3, cough, dyspnea. WBC 12. Rules out for sepsis. Normal lactic acid 1.7. O2 sats 95% on room air. Portable chest x-ray-patchy opacity in right midlung. COVID-19 test negative. Completed COVID-19 vaccination. -Continue empiric community-acquired pneumonia coverage with ceftriaxone and doxycycline (borderline prolonged QTC). -Mucolytic's -Follow-up blood cultures obtained in the ED.  Chest pain-likely related to pneumonia. History of CAD, follows with cardiologist at [facility] for HFpEF, no details CAD in [place], patient denies heart attack. Troponins flat and consistent with prior. EKG shows mild ST depressions in lateral leads- ? Related to hyperkalemia, improved on subsequent EKG. -Repeat EKG in a.m. -Continue aspirin, statins  Hyperkalemia, ESRD- k - 6. HD schedule Monday Wednesday Friday. Compliant. Last HD Friday. Hx of  renal transplant. -Lokelma 10mg  x1 given. -Please consult nephrology in a.m. for HD - Resume renvela - Resume prograf, prednisone 5mg , doubt need for stress dose steroids.  Diastolic CHF-stable and compensated, but last echo 12/2018 shows EF of 40 to 45%, mild mitral regurgitation and mitral stenosis. -Volume status per HD.  Hypertension-blood pressure elevated 150s to 190s. Not taking home medications today. -Resume home clonidine 0.1 daily, metoprolol, Imdur  Uncontrolled diabetes mellitus-random glucose 96. -Resume home Tresiba at reduced dose 8 units nightly ( home dose 15u) -  Hold weekly Trulicity for now - SSI  DVT prophylaxis: Heparin Code Status: Full code. Family Communication: Spouse at bedside.  Disposition Plan:  1 - 2 days, pending stable respiratory status. Consults called: Please consult nephrology in a.m. Admission status: Observation, telemetry   Bethena Roys MD [facility]  07/20/2019, 4:38 PM

## 2019-07-20 NOTE — ED Triage Notes (Signed)
Awakened with SOb this am   Hx kidney transplant 2010 On dialysis for the last 2-3 years   Last dialysis Friday (M_W_F)   Non smoker   LS diminished   PCP [facility]

## 2019-07-20 NOTE — ED Triage Notes (Signed)
Pt transplant from [facility]  She continues as their pt

## 2019-07-20 NOTE — ED Provider Notes (Signed)
[facility] EMERGENCY DEPARTMENT Provider Note   CSN: [id number] Arrival date & time: 07/20/19  1247     History Chief Complaint  Patient presents with  . Shortness of Breath    [name] is a 64 y.o. female who  has a past medical history of Anemia, Blood transfusion without reported diagnosis, Cardiovascular disease, Carotid artery stenosis (2008), CHF (congestive heart failure) ([place]), Coronary artery disease, Diabetes mellitus, Dyslipidemia, Edema of lower extremity, Heart murmur, Hypertension, Mitral regurgitation, Nephrotic syndrome, Patent foramen ovale, Pulmonary hypertension, moderate to severe ([place]), Pulmonary nodule, Tricuspid regurgitation, and Volume depletion, renal, due to output loss (renal deficit). She presents with c/o sob. She is chronically immunocompromised on Prograf. The patient dialyzes MWF and had full dialysis on Friday. She states that Wednesday of this past week she had her monthly labs drawn and they noticed that her potassium was just above normal.  She spoke with the nephrologist he said he was not worried about it.  Today she was sitting in her living room as her husband was getting ready to go to church.  She states that she frequently gets bouts of cold spells due to her anemia and was feeling very chilly so went to her bedroom to wrap up in an electric blanket.  She said that when she laid down she had onset of chest pressure and coughing.  Since that time she has had persistent pressure in her chest, pain with breathing.  She denies feelings of fluid volume overload, nausea, diaphoresis.  She has persistent chest discomfort.  She denies recent illnesses.  She has been fully vaccinated for coronavirus.   HPI     Past Medical History:  Diagnosis Date  . Anemia    of chronic disease  . Blood transfusion without reported diagnosis   . Cardiovascular disease    nonobstructive  . Carotid artery stenosis 2008  . CHF (congestive heart failure) ([place])   .  Coronary artery disease   . Diabetes mellitus   . Dyslipidemia   . Edema of lower extremity    with hypo-albuminemia and profound protenuria  . Heart murmur   . Hypertension   . Mitral regurgitation   . Nephrotic syndrome   . Patent foramen ovale   . Pulmonary hypertension, moderate to severe ([place])   . Pulmonary nodule   . Tricuspid regurgitation   . Volume depletion, renal, due to output loss (renal deficit)     Patient Active Problem List   Diagnosis Date Noted  . Pneumonia 07/20/2019  . Nausea with vomiting, unspecified 05/25/2019  . Abnormal CXR (chest x-ray) 05/25/2019  . Acute diastolic CHF (congestive heart failure) ([place]) 12/27/2018  . Acute hypoxemic respiratory failure ([place]) 12/16/2018  . Dyspnea 12/15/2018  . Lung collapse 12/15/2018  . HCAP (healthcare-associated pneumonia)   . Lobar pneumonia ([place]) 12/02/2018  . Acute respiratory failure with hypoxia ([place]) 12/02/2018  . Hydronephrosis   . Abnormal LFTs (liver function tests) 03/11/2018  . Type 2 diabetes mellitus ([place]) 03/11/2018  . Valvular heart disease 03/11/2018  . Chronic kidney disease on chronic dialysis ([place]) 11/13/2017  . Leukocytosis 09/21/2017  . Generalized weakness 09/21/2017  . Hyperlipidemia associated with type 2 diabetes mellitus ([place]) 02/22/2017  . ESRD (end stage renal disease) ([place]) 2016-09-03  . 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 Encounter for aftercare following kidney transplant 05/22/2016  . Chronic diastolic heart failure ([place]) 06/08/2015  . Aortic valve sclerosis 06/08/2015  . Anemia of chronic disease 07/03/2013  . Essential  hypertension 12/18/2010    Past Surgical History:  Procedure Laterality Date  . A/V FISTULAGRAM N/A 04/05/2017   Procedure: A/V FISTULAGRAM - Left Arm;  Surgeon: Angelia Mould, MD;  Location: [place] [facility];  Service: Cardiovascular;  Laterality: N/A;  . A/V FISTULAGRAM Left 09/18/2017   Procedure: A/V FISTULAGRAM;  Surgeon:  Serafina Mitchell, MD;  Location: [place] [facility];  Service: Cardiovascular;  Laterality: Left;  . A/V SHUNT INTERVENTION Left 04/05/2017   Procedure: A/V SHUNT INTERVENTION;  Surgeon: Angelia Mould, MD;  Location: [place] [facility];  Service: Cardiovascular;  Laterality: Left;  . CARDIAC CATHETERIZATION  2008  . IR REMOVAL TUN CV CATH W/O FL  11/02/2016  . 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 PERIPHERAL VASCULAR BALLOON ANGIOPLASTY Left 09/18/2017   Procedure: PERIPHERAL VASCULAR BALLOON ANGIOPLASTY;  Surgeon: Serafina Mitchell, MD;  Location: [place] [facility];  Service: Cardiovascular;  Laterality: Left;  Arm Fistula     OB History   No obstetric history on file.     Family History  Problem Relation Age of Onset  . Kidney disease Mother   . Diabetes Mother   . Diabetes Father   . Heart disease Father     Social History   Tobacco Use  . Smoking status: Never Smoker  . Smokeless tobacco: Never Used  Substance Use Topics  . Alcohol use: No  . Drug use: No    Home Medications Prior to Admission medications   Medication Sig Start Date End Date Taking? Authorizing Provider  acetaminophen (TYLENOL) 500 MG tablet Take 500 mg by mouth every 6 (six) hours as needed for moderate pain or headache.  04/06/16  Yes [provider]  aspirin (ASPIR-LOW) 81 MG EC tablet Take 1 tablet (81 mg total) by mouth daily with breakfast. 05/27/19  Yes Emokpae, Courage, MD  B Complex-C-Folic Acid (RENA-VITE RX) 1 MG TABS Take 1 tablet by mouth daily. 09/24/18  Yes [provider]  cloNIDine (CATAPRES) 0.1 MG tablet Take 1 tablet (0.1 mg total) by mouth daily. 07/08/19  Yes Hawks, Christy A, FNP  insulin degludec (TRESIBA FLEXTOUCH) 100 UNIT/ML FlexTouch Pen Inject 0.15 mLs (15 Units total) into the skin daily. 07/08/19  Yes Hawks, Christy A, FNP  isosorbide mononitrate (IMDUR) 60 MG 24 hr tablet Take 1 tablet (60 mg total) by mouth daily. 07/08/19  Yes Hawks, Christy A, FNP    metoprolol succinate (TOPROL-XL) 25 MG 24 hr tablet Take 1 tablet (25 mg total) by mouth daily. 07/08/19  Yes Hawks, Christy A, FNP  ondansetron (ZOFRAN) 4 MG tablet Take 1 tablet (4 mg total) by mouth every 8 (eight) hours as needed for nausea or vomiting. 07/08/19  Yes Hawks, Christy A, FNP  PROGRAF 0.5 MG capsule Take 0.5 mg by mouth daily. Take 0.5mg  by mouth each morning. 10/24/16  Yes [provider]  rosuvastatin (CRESTOR) 20 MG tablet Take 1 tablet (20 mg total) by mouth daily. 07/08/19  Yes Hawks, Christy A, FNP  senna (SENOKOT) 8.6 MG TABS tablet Take 1 tablet (8.6 mg total) by mouth 2 (two) times daily. 05/27/19  Yes Roxan Hockey, MD  sevelamer carbonate (RENVELA) 800 MG tablet Take 1,600-3,200 mg by mouth 5 (five) times daily. Patient takes 4 tablets(3200mg ) 3 times a day with meals and 2 tablets(1600mg ) 2 times a day with snacks 10/02/16  Yes [provider]  Dulaglutide (TRULICITY) 1.5 ZO/1.0RU SOPN INJECT 1.5MG   SUBCUTANEOUSLY ONCE A WEEK 07/08/19   Evelina Dun  A, FNP    Allergies    Other and Tape  Review of Systems   Review of Systems Ten systems reviewed and are negative for acute change, except as noted in the HPI.   Physical Exam Updated Vital Signs BP (!) 195/60 (BP Location: Left Arm)   Pulse 76   Temp 99.3 F (37.4 C) (Oral)   Resp 18   Ht 5\' 9"  (1.753 m)   Wt 70.3 kg   SpO2 98%   BMI 22.89 kg/m   Physical Exam Vitals and nursing note reviewed.  Constitutional:      General: She is not in acute distress.    Appearance: She is well-developed. She is not diaphoretic.     Comments: Sallow skin, ill -appearing  HENT:     Head: Normocephalic and atraumatic.  Eyes:     General: No scleral icterus.    Conjunctiva/sclera: Conjunctivae normal.  Cardiovascular:     Rate and Rhythm: Normal rate and regular rhythm.     Heart sounds: Normal heart sounds. No murmur. No friction rub. No gallop.      Comments: Dialysis graft L forearm with palpable  thrill Pulmonary:     Effort: Pulmonary effort is normal. No respiratory distress.     Breath sounds: Normal breath sounds.  Abdominal:     General: Bowel sounds are normal. There is no distension.     Palpations: Abdomen is soft. There is no mass.     Tenderness: There is no abdominal tenderness. There is no guarding.  Musculoskeletal:     Cervical back: Normal range of motion.  Skin:    General: Skin is warm and dry.  Neurological:     Mental Status: She is alert and oriented to person, place, and time.  Psychiatric:        Behavior: Behavior normal.     ED Results / Procedures / Treatments   Labs (all labs ordered are listed, but only abnormal results are displayed) Labs Reviewed  COMPREHENSIVE METABOLIC PANEL - Abnormal; Notable for the following components:      Result Value   Potassium 6.0 (*)    BUN 34 (*)    Creatinine, Ser 7.36 (*)    Calcium 8.6 (*)    Albumin 3.2 (*)    AST 53 (*)    GFR calc non Af Amer 5 (*)    GFR calc Af Amer 6 (*)    All other components within normal limits  CBC WITH DIFFERENTIAL/PLATELET - Abnormal; Notable for the following components:   WBC 12.0 (*)    RBC 3.59 (*)    Hemoglobin 10.3 (*)    HCT 35.0 (*)    MCHC 29.4 (*)    Neutro Abs 9.7 (*)    Eosinophils Absolute 0.6 (*)    All other components within normal limits  I-STAT CHEM 8, ED - Abnormal; Notable for the following components:   Potassium 5.6 (*)    BUN 32 (*)    Creatinine, Ser 7.40 (*)    Calcium, Ion 1.11 (*)    Hemoglobin 10.5 (*)    HCT 31.0 (*)    All other components within normal limits  TROPONIN I (HIGH SENSITIVITY) - Abnormal; Notable for the following components:   Troponin I (High Sensitivity) 68 (*)    All other components within normal limits  TROPONIN I (HIGH SENSITIVITY) - Abnormal; Notable for the following components:   Troponin I (High Sensitivity) 67 (*)    All other components  within normal limits  SARS CORONAVIRUS 2 BY RT PCR (HOSPITAL ORDER,  [place] [facility])  CULTURE, BLOOD (ROUTINE X 2)  MRSA PCR SCREENING  LACTIC ACID, PLASMA  CBC WITH DIFFERENTIAL/PLATELET    EKG EKG Interpretation  Date/Time:  Sunday Jul 20 2019 15:20:16 EDT Ventricular Rate:  79 PR Interval:    QRS Duration: 101 QT Interval:  426 QTC Calculation: 489 R Axis:   148 Text Interpretation: Sinus rhythm Atrial premature complex Right axis deviation Borderline repolarization abnormality Borderline prolonged QT interval Confirmed by Davonna Belling [phone]) on 07/20/2019 3:25:26 PM   Radiology DG Chest Port 1 View  Result Date: 07/20/2019 CLINICAL DATA:  Shortness of breath EXAM: PORTABLE CHEST 1 VIEW COMPARISON:  05/26/2019 FINDINGS: Stable mild cardiomegaly. Atherosclerotic calcification of the aortic knob. Prominent interstitial markings bilaterally with patchy opacity in the right mid lung. No pleural effusion or pneumothorax. No acute osseous findings. IMPRESSION: Prominent interstitial markings bilaterally with patchy opacity in the right mid lung. Findings may reflect asymmetric edema versus atypical/viral infection. Electronically Signed   By: Davina Poke D.O.   On: 07/20/2019 13:51    Procedures Procedures (including critical care time)  Medications Ordered in ED Medications  vancomycin (VANCOCIN) IVPB 1000 mg/200 mL premix (1,000 mg Intravenous New Bag/Given 07/20/19 1519)  ceFEPIme (MAXIPIME) 1 g in sodium chloride 0.9 % 100 mL IVPB (has no administration in time range)  vancomycin (VANCOCIN) IVPB 500 mg/100 ml premix (has no administration in time range)  metoCLOPramide (REGLAN) injection 10 mg (10 mg Intravenous Given 07/20/19 1405)  ceFEPIme (MAXIPIME) 2 g in sodium chloride 0.9 % 100 mL IVPB (0 g Intravenous Stopped 07/20/19 1519)  sodium zirconium cyclosilicate (LOKELMA) packet 10 g (10 g Oral Given 07/20/19 1447)    ED Course  I have reviewed the triage vital signs and the nursing notes.  Pertinent labs &  imaging results that were available during my care of the patient were reviewed by me and considered in my medical decision making (see chart for details).  Clinical Course as of Jul 19 1613  Sun Jul 20, 2019  1406 WBC(!): 12.0 [AH]  1407 DG Chest [place] 1 View [AH]    Clinical Course User Index [AH] Margarita Mail, [place]   MDM Rules/Calculators/A&P                      CC: Chest pain VS:  Vitals:   07/20/19 1300 07/20/19 1312 07/20/19 1324 07/20/19 1430  BP: (!) 195/60 (!) 195/60 (!) 195/60   Pulse: 69 72 71 76  Resp: 20 20 16 18   Temp:   99.3 F (37.4 C)   TempSrc:   Oral   SpO2: 96% 95% 96% 98%  Weight:      Height:        NL:GXQJJHE is gathered by patient and EMR. Previous records obtained and reviewed. DDX:The patient's complaint of cp involves an extensive number of diagnostic and treatment options, and is a complaint that carries with it a high risk of complications, morbidity, and potential mortality. Given the large differential diagnosis, medical decision making is of high complexity.   Labs: I ordered reviewed and interpreted labs which include lactic acid which was within normal limits, CMP, which shows elevated potassium without EKG changes Patient's troponin elevated but at baseline for her in the setting of end-stage renal disease.  Elevated white blood cell count of 12,000, hemoglobin of 10.3 appears chronic and stable  Imaging: I ordered  and reviewed images which included 1 view chest x-ray. I independently visualized and interpreted all imaging. Significant findings include hazy opacity in the right middle lobe consistent with pneumonia.  EKG: Initial EKG with ST segment depressions and ischemic change however repeat EKG is significantly improved and appears consistent with previous. Consults: Dr. [name] for admission MDM: Patient here with complaints of chest pain shortness of breath and chills.  Her work-up is consistent with acute right middle lobe pneumonia.   I have low suspicion for ACS, pulmonary embolus.  She does not fit sepsis criteria.  She is otherwise hemodynamically stable.  No obvious volume overload.  She does not need emergent dialysis.  Patient is a high risk patient given her history of immunocompromise and end-stage renal disease and would benefit from admission.  She is otherwise stable here in the emergency department and feels greatly improved. Patient disposition: Admit The patient appears reasonably stabilized for admission considering the current resources, flow, and capabilities available in the ED at this time, and I doubt any other [facility] requiring further screening and/or treatment in the ED prior to admission.        Final Clinical Impression(s) / ED Diagnoses Final diagnoses:  SOB (shortness of breath)    Rx / DC Orders ED Discharge Orders    None       Margarita Mail, PA-C 07/20/19 1615    Fredia Sorrow, MD 07/24/19 [phone]

## 2019-07-20 NOTE — ED Provider Notes (Signed)
Medical screening examination/treatment/procedure(s) were conducted as a shared visit with non-physician practitioner(s) and myself.  I personally evaluated the patient during the encounter.  EKG Interpretation  Date/Time:  Sunday Jul 20 2019 13:01:09 EDT Ventricular Rate:  70 PR Interval:    QRS Duration: 104 QT Interval:  445 QTC Calculation: 481 R Axis:   146 Text Interpretation: Sinus rhythm Right axis deviation Consider left ventricular hypertrophy Abnormal T, consider ischemia, diffuse leads Minimal ST elevation, lateral leads ST depression, consider subendocardial injury Confirmed by Fredia Sorrow [phone]) on 07/20/2019 1:04:51 PM   Patient seen by me along with the physician assistant.  Patient is a dialysis patient normally dialyzed Monday Wednesday Fridays.  She did have full dialysis on Friday.  Patient when she first awoke this morning around 930 did not feel well.  She felt fine yesterday.  Patient has been on dialysis now for 2 to 3 years.  Prior to that she did have a kidney transplant.  Patient felt kind of chilled and a cough some shortness of breath.  Clinically was concerned about may be an infection.  She has an EKG with ST segment depression no evidence of elevation.  Initial point-of-care potassium was five-point 6 repeat was 6.0.  But does not too significant to explain some of the EKG changes.  Because patient is a chronic dialysis patient also has a little bit of leukocytosis.  Chest x-ray showed evidence of patchy infiltrate right middle lobe.  Could be an atypical infiltrate.  Covid testing is pending.  Patient began dialysis patient restart on broad-spectrum antibiotics and she will require admission.  Blood cultures done and are pending.  Lactic acid also elevated.  Patient's initial troponin was baseline for her at about 60.  Which is not unusual.  She will have a delta troponin.  But I think that this is mostly an infectious process.   Fredia Sorrow,  MD 07/20/19 1501

## 2019-07-20 NOTE — Progress Notes (Signed)
Pharmacy Antibiotic Note  [name] is a 64 y.o. female admitted on 07/20/2019 with pneumonia.  Pharmacy has been consulted for cefepime and vancomycin dosing.  Plan: Vancomycin 750mg  IV every QMWF Dialysis hours.  Goal trough 15-20 mcg/mL. Cefepime 1gm iv q24h after dialysis   Height: 5\' 9"  (175.3 cm) Weight: 70.3 kg (155 lb) IBW/kg (Calculated) : 66.2  Temp (24hrs), Avg:99.3 F (37.4 C), Min:99.3 F (37.4 C), Max:99.3 F (37.4 C)  Recent Labs  Lab 07/20/19 1328 07/20/19 1346 07/20/19 1354  WBC  --  12.0*  --   CREATININE 7.36*  --  7.40*    Estimated Creatinine Clearance: 8 mL/min (A) (by C-G formula based on SCr of 7.4 mg/dL (H)).    Allergies  Allergen Reactions  . Other Other (See Comments)    Tape Bruises and tears skin, Paper tape tolerated  . Tape Other (See Comments)    Bruises and tears skin Paper tape tolerated Bruises and tears skin Paper tape tolerated Bruises and tears skin Paper tape tolerated    Antimicrobials this admission: 5/16 cefepime >>  5/16 vancomycin >>   Microbiology results: 5/16 BCx: sent  5/16 Covid 19: sent  Thank you for allowing pharmacy to be a part of this patient's care.  [name] 07/20/2019 2:20 PM

## 2019-07-20 NOTE — ED Notes (Signed)
ED TO INPATIENT HANDOFF REPORT  ED Nurse Name and Phone #:  Fabio Neighbors RN  S Name/Age/Gender [name] 64 y.o. female Room/Bed: APA03/APA03  Code Status   Code Status: Prior  Home/SNF/Other Home Patient oriented to: self, place, time and situation Is this baseline? Yes   Triage Complete: Triage complete  Chief Complaint Pneumonia [J18.9]  Triage Note Awakened with SOb this am   Hx kidney transplant 2010 On dialysis for the last 2-3 years   Last dialysis Friday (M_W_F)   Non smoker   LS diminished   PCP [facility]  Pt transplant from [facility]  She continues as their pt     Allergies Allergies  Allergen Reactions  . Other Other (See Comments)    Tape Bruises and tears skin, Paper tape tolerated  . Tape Other (See Comments)    Bruises and tears skin Paper tape tolerated Bruises and tears skin Paper tape tolerated Bruises and tears skin Paper tape tolerated    Level of Care/Admitting Diagnosis ED Disposition    ED Disposition Condition [place]: [facility] [[id number]]  Level of Care: Telemetry [5]  Covid Evaluation: Confirmed COVID Negative  Diagnosis: Pneumonia [494496]  Admitting Physician: Kara Pacer  Attending Physician: Bethena Roys Nessa.Cuff       B Medical/Surgery History Past Medical History:  Diagnosis Date  . Anemia    of chronic disease  . Blood transfusion without reported diagnosis   . Cardiovascular disease    nonobstructive  . Carotid artery stenosis 2008  . CHF (congestive heart failure) ([place])   . Coronary artery disease   . Diabetes mellitus   . Dyslipidemia   . Edema of lower extremity    with hypo-albuminemia and profound protenuria  . Heart murmur   . Hypertension   . Mitral regurgitation   . Nephrotic syndrome   . Patent foramen ovale   . Pulmonary hypertension, moderate to severe ([place])   . Pulmonary nodule   . Tricuspid regurgitation   . Volume depletion,  renal, due to output loss (renal deficit)    Past Surgical History:  Procedure Laterality Date  . A/V FISTULAGRAM N/A 04/05/2017   Procedure: A/V FISTULAGRAM - Left Arm;  Surgeon: Angelia Mould, MD;  Location: [place] CV LAB;  Service: Cardiovascular;  Laterality: N/A;  . A/V FISTULAGRAM Left 09/18/2017   Procedure: A/V FISTULAGRAM;  Surgeon: Serafina Mitchell, MD;  Location: [place] CV LAB;  Service: Cardiovascular;  Laterality: Left;  . A/V SHUNT INTERVENTION Left 04/05/2017   Procedure: A/V SHUNT INTERVENTION;  Surgeon: Angelia Mould, MD;  Location: [place] CV LAB;  Service: Cardiovascular;  Laterality: Left;  . CARDIAC CATHETERIZATION  2008  . IR REMOVAL TUN CV CATH W/O FL  11/02/2016  . 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 PERIPHERAL VASCULAR BALLOON ANGIOPLASTY Left 09/18/2017   Procedure: PERIPHERAL VASCULAR BALLOON ANGIOPLASTY;  Surgeon: Serafina Mitchell, MD;  Location: [place] CV LAB;  Service: Cardiovascular;  Laterality: Left;  Arm Fistula     A IV Location/Drains/Wounds Patient Lines/Drains/Airways Status   Active Line/Drains/Airways    Name:   Placement date:   Placement time:   Site:   Days:   Peripheral IV 07/20/19 Right Hand   07/20/19    1353    Hand   less than 1   Fistula / Graft Left Forearm   --    --    Forearm  Fistula / Graft Left Forearm Arteriovenous fistula   --    --    Forearm      Sheath 04/05/17 Left   04/05/17    0740    --   836          Intake/Output Last 24 hours  Intake/Output Summary (Last 24 hours) at 07/20/2019 1739 Last data filed at 07/20/2019 1635 Gross per 24 hour  Intake 533.62 ml  Output --  Net 533.62 ml    Labs/Imaging Results for orders placed or performed during the hospital encounter of 07/20/19 (from the past 48 hour(s))  SARS Coronavirus 2 by RT PCR (hospital order, performed in [facility] hospital lab) Nasopharyngeal Nasopharyngeal Swab     Status: None   Collection Time: 07/20/19  1:22 PM    Specimen: Nasopharyngeal Swab  Result Value Ref Range   SARS Coronavirus 2 NEGATIVE NEGATIVE    Comment: (NOTE) SARS-CoV-2 target nucleic acids are NOT DETECTED. The SARS-CoV-2 RNA is generally detectable in upper and lower respiratory specimens during the acute phase of infection. The lowest concentration of SARS-CoV-2 viral copies this assay can detect is 250 copies / mL. A negative result does not preclude SARS-CoV-2 infection and should not be used as the sole basis for treatment or other patient management decisions.  A negative result may occur with improper specimen collection / handling, submission of specimen other than nasopharyngeal swab, presence of viral mutation(s) within the areas targeted by this assay, and inadequate number of viral copies (<250 copies / mL). A negative result must be combined with clinical observations, patient history, and epidemiological information. Fact Sheet for Patients:   StrictlyIdeas.no Fact Sheet for Healthcare Providers: BankingDealers.co.za This test is not yet approved or cleared  by the [place] FDA and has been authorized for detection and/or diagnosis of SARS-CoV-2 by FDA under an Emergency Use Authorization (EUA).  This EUA will remain in effect (meaning this test can be used) for the duration of the COVID-19 declaration under Section 564(b)(1) of the Act, 21 U.S.C. section 360bbb-3(b)(1), unless the authorization is terminated or revoked sooner. Performed at [facility], [address] [place], [place] [address]   Comprehensive metabolic panel     Status: Abnormal   Collection Time: 07/20/19  1:28 PM  Result Value Ref Range   Sodium 138 135 - 145 mmol/L   Potassium 6.0 (H) 3.5 - 5.1 mmol/L   Chloride 99 98 - 111 mmol/L   CO2 28 22 - 32 mmol/L   Glucose, Bld 96 70 - 99 mg/dL    Comment: Glucose reference range applies only to samples taken after fasting for at least 8 hours.    BUN 34 (H) 8 - 23 mg/dL   Creatinine, Ser 7.36 (H) 0.44 - 1.00 mg/dL   Calcium 8.6 (L) 8.9 - 10.3 mg/dL   Total Protein 7.2 6.5 - 8.1 g/dL   Albumin 3.2 (L) 3.5 - 5.0 g/dL   AST 53 (H) 15 - 41 U/L   ALT 22 0 - 44 U/L   Alkaline Phosphatase 89 38 - 126 U/L   Total Bilirubin 1.2 0.3 - 1.2 mg/dL   GFR calc non Af Amer 5 (L) >60 mL/min   GFR calc Af Amer 6 (L) >60 mL/min   Anion gap 11 5 - 15    Comment: Performed at [facility], [address], [place], [place] [address]  Troponin I (High Sensitivity)     Status: Abnormal   Collection Time: 07/20/19  1:28 PM  Result Value Ref Range   Troponin I (High Sensitivity) 68 (H) <18 ng/L    Comment: (NOTE) Elevated high sensitivity troponin I (hsTnI) values and significant  changes across serial measurements may suggest ACS but many other  chronic and acute conditions are known to elevate hsTnI results.  Refer to the "Links" section for chest pain algorithms and additional  guidance. Performed at [facility], [address] [place], [place] [address]   CBC with Differential     Status: Abnormal   Collection Time: 07/20/19  1:46 PM  Result Value Ref Range   WBC 12.0 (H) 4.0 - 10.5 K/uL   RBC 3.59 (L) 3.87 - 5.11 MIL/uL   Hemoglobin 10.3 (L) 12.0 - 15.0 g/dL   HCT 35.0 (L) 36.0 - 46.0 %   MCV 97.5 80.0 - 100.0 fL   MCH 28.7 26.0 - 34.0 pg   MCHC 29.4 (L) 30.0 - 36.0 g/dL   RDW 15.2 11.5 - 15.5 %   Platelets 212 150 - 400 K/uL   nRBC 0.0 0.0 - 0.2 %   Neutrophils Relative % 81 %   Neutro Abs 9.7 (H) 1.7 - 7.7 K/uL   Lymphocytes Relative 9 %   Lymphs Abs 1.0 0.7 - 4.0 K/uL   Monocytes Relative 4 %   Monocytes Absolute 0.5 0.1 - 1.0 K/uL   Eosinophils Relative 5 %   Eosinophils Absolute 0.6 (H) 0.0 - 0.5 K/uL   Basophils Relative 1 %   Basophils Absolute 0.1 0.0 - 0.1 K/uL   Immature Granulocytes 0 %   Abs Immature Granulocytes 0.04 0.00 - 0.07 K/uL    Comment: Performed at [facility], [address] [place], [place] [address]   I-stat chem 8, ED (not at [facility] or [facility])     Status: Abnormal   Collection Time: 07/20/19  1:54 PM  Result Value Ref Range   Sodium 139 135 - 145 mmol/L   Potassium 5.6 (H) 3.5 - 5.1 mmol/L   Chloride 100 98 - 111 mmol/L   BUN 32 (H) 8 - 23 mg/dL   Creatinine, Ser 7.40 (H) 0.44 - 1.00 mg/dL   Glucose, Bld 89 70 - 99 mg/dL    Comment: Glucose reference range applies only to samples taken after fasting for at least 8 hours.   Calcium, Ion 1.11 (L) 1.15 - 1.40 mmol/L   TCO2 28 22 - 32 mmol/L   Hemoglobin 10.5 (L) 12.0 - 15.0 g/dL   HCT 31.0 (L) 36.0 - 46.0 %  Blood Culture (routine x 2)     Status: None (Preliminary result)   Collection Time: 07/20/19  2:40 PM   Specimen: BLOOD RIGHT FOREARM  Result Value Ref Range   Specimen Description      BLOOD RIGHT FOREARM BOTTLES DRAWN AEROBIC AND ANAEROBIC   Special Requests      Blood Culture adequate volume Performed at [facility], [address], [place], [place] [address]    Culture  Setup Time PENDING    Culture PENDING    Report Status PENDING   Lactic acid, plasma     Status: None   Collection Time: 07/20/19  2:40 PM  Result Value Ref Range   Lactic Acid, Venous 1.7 0.5 - 1.9 mmol/L    Comment: Performed at [facility], [address], [place], [place] [address]  Troponin I (High Sensitivity)     Status: Abnormal   Collection Time: 07/20/19  2:42 PM  Result Value Ref Range  Troponin I (High Sensitivity) 67 (H) <18 ng/L    Comment: (NOTE) Elevated high sensitivity troponin I (hsTnI) values and significant  changes across serial measurements may suggest ACS but many other  chronic and acute conditions are known to elevate hsTnI results.  Refer to the "Links" section for chest pain algorithms and additional  guidance. Performed at [facility], [address] [place], [place] [address]   MRSA PCR Screening     Status: None   Collection Time: 07/20/19  3:57 PM   Specimen: Nasal Mucosa; Nasopharyngeal  Result Value Ref Range    MRSA by PCR NEGATIVE NEGATIVE    Comment:        The GeneXpert MRSA Assay (FDA approved for NASAL specimens only), is one component of a comprehensive MRSA colonization surveillance program. It is not intended to diagnose MRSA infection nor to guide or monitor treatment for MRSA infections. Performed at [facility], [address], [place], [place] [address]    DG Chest Port 1 View  Result Date: 07/20/2019 CLINICAL DATA:  Shortness of breath EXAM: PORTABLE CHEST 1 VIEW COMPARISON:  05/26/2019 FINDINGS: Stable mild cardiomegaly. Atherosclerotic calcification of the aortic knob. Prominent interstitial markings bilaterally with patchy opacity in the right mid lung. No pleural effusion or pneumothorax. No acute osseous findings. IMPRESSION: Prominent interstitial markings bilaterally with patchy opacity in the right mid lung. Findings may reflect asymmetric edema versus atypical/viral infection. Electronically Signed   By: Davina Poke D.O.   On: 07/20/2019 13:51    Pending Labs Unresulted Labs (From admission, onward)    Start     Ordered   07/20/19 1303  CBC with Differential  ONCE - STAT,   STAT     07/20/19 1302   Signed and Held  Basic metabolic panel  Tomorrow morning,   R     Signed and Held   Signed and Held  CBC  Tomorrow morning,   R     Signed and Held          Vitals/Pain Today's Vitals   07/20/19 1300 07/20/19 1312 07/20/19 1324 07/20/19 1430  BP: (!) 195/60 (!) 195/60 (!) 195/60 (!) 145/54  Pulse: 69 72 71 76  Resp: 20 20 16 18   Temp:   99.3 F (37.4 C)   TempSrc:   Oral   SpO2: 96% 95% 96% 98%  Weight:      Height:      PainSc:        Isolation Precautions No active isolations  Medications Medications  ceFEPIme (MAXIPIME) 1 g in sodium chloride 0.9 % 100 mL IVPB (has no administration in time range)  vancomycin (VANCOCIN) IVPB 500 mg/100 ml premix (has no administration in time range)  cloNIDine (CATAPRES) tablet 0.1 mg (has no administration in  time range)  isosorbide mononitrate (IMDUR) 24 hr tablet 60 mg (has no administration in time range)  metoprolol succinate (TOPROL-XL) 24 hr tablet 25 mg (has no administration in time range)  metoCLOPramide (REGLAN) injection 10 mg (10 mg Intravenous Given 07/20/19 1405)  vancomycin (VANCOCIN) IVPB 1000 mg/200 mL premix (0 mg Intravenous Stopped 07/20/19 1625)  ceFEPIme (MAXIPIME) 2 g in sodium chloride 0.9 % 100 mL IVPB (0 g Intravenous Stopped 07/20/19 1519)  sodium zirconium cyclosilicate (LOKELMA) packet 10 g (10 g Oral Given 07/20/19 1447)    Mobility walks Low fall risk   Focused Assessments    R Recommendations: See Admitting Provider Note  Report given to:   Additional Notes:  Lattie Haw  300 RN

## 2019-07-21 DIAGNOSIS — I10 Essential (primary) hypertension: Secondary | ICD-10-CM | POA: Diagnosis not present

## 2019-07-21 DIAGNOSIS — Z992 Dependence on renal dialysis: Secondary | ICD-10-CM

## 2019-07-21 DIAGNOSIS — E1129 Type 2 diabetes mellitus with other diabetic kidney complication: Secondary | ICD-10-CM | POA: Diagnosis not present

## 2019-07-21 DIAGNOSIS — I509 Heart failure, unspecified: Secondary | ICD-10-CM | POA: Diagnosis not present

## 2019-07-21 DIAGNOSIS — J189 Pneumonia, unspecified organism: Secondary | ICD-10-CM | POA: Diagnosis not present

## 2019-07-21 DIAGNOSIS — N186 End stage renal disease: Secondary | ICD-10-CM

## 2019-07-21 DIAGNOSIS — E875 Hyperkalemia: Secondary | ICD-10-CM | POA: Diagnosis not present

## 2019-07-21 DIAGNOSIS — I5032 Chronic diastolic (congestive) heart failure: Secondary | ICD-10-CM | POA: Diagnosis not present

## 2019-07-21 DIAGNOSIS — Z940 Kidney transplant status: Secondary | ICD-10-CM

## 2019-07-21 DIAGNOSIS — I132 Hypertensive heart and chronic kidney disease with heart failure and with stage 5 chronic kidney disease, or end stage renal disease: Secondary | ICD-10-CM | POA: Diagnosis not present

## 2019-07-21 DIAGNOSIS — E1122 Type 2 diabetes mellitus with diabetic chronic kidney disease: Secondary | ICD-10-CM

## 2019-07-21 DIAGNOSIS — I2510 Atherosclerotic heart disease of native coronary artery without angina pectoris: Secondary | ICD-10-CM | POA: Diagnosis not present

## 2019-07-21 LAB — CBC
HCT: 29 % — ABNORMAL LOW (ref 36.0–46.0)
Hemoglobin: 8.5 g/dL — ABNORMAL LOW (ref 12.0–15.0)
MCH: 28.6 pg (ref 26.0–34.0)
MCHC: 29.3 g/dL — ABNORMAL LOW (ref 30.0–36.0)
MCV: 97.6 fL (ref 80.0–100.0)
Platelets: 178 10*3/uL (ref 150–400)
RBC: 2.97 MIL/uL — ABNORMAL LOW (ref 3.87–5.11)
RDW: 15.1 % (ref 11.5–15.5)
WBC: 10.2 10*3/uL (ref 4.0–10.5)
nRBC: 0 % (ref 0.0–0.2)

## 2019-07-21 LAB — GLUCOSE, CAPILLARY
Glucose-Capillary: 167 mg/dL — ABNORMAL HIGH (ref 70–99)
Glucose-Capillary: 192 mg/dL — ABNORMAL HIGH (ref 70–99)
Glucose-Capillary: 163 mg/dL — ABNORMAL HIGH (ref 70–99)

## 2019-07-21 MED ORDER — SEVELAMER CARBONATE 800 MG PO TABS
1600.0000 mg | ORAL_TABLET | Freq: Two times a day (BID) | ORAL | Status: DC
  Filled 2019-07-21: qty 2

## 2019-07-21 MED ORDER — DARBEPOETIN ALFA 40 MCG/0.4ML IJ SOSY
40.0000 ug | PREFILLED_SYRINGE | INTRAMUSCULAR | Status: DC
  Administered 2019-07-21: 40 ug via SUBCUTANEOUS
  Filled 2019-07-21: qty 0.4

## 2019-07-21 MED ORDER — LIDOCAINE-PRILOCAINE 2.5-2.5 % EX CREA: 1.0000 "application " | TOPICAL_CREAM | CUTANEOUS | Status: DC | PRN

## 2019-07-21 MED ORDER — SEVELAMER CARBONATE 800 MG PO TABS
3200.0000 mg | ORAL_TABLET | Freq: Three times a day (TID) | ORAL | Status: DC
  Administered 2019-07-21: 3200 mg via ORAL
  Filled 2019-07-21: qty 4

## 2019-07-21 MED ORDER — CLONIDINE HCL 0.1 MG PO TABS
0.1000 mg | ORAL_TABLET | Freq: Every day | ORAL | Status: DC
  Administered 2019-07-21: 0.1 mg via ORAL

## 2019-07-21 MED ORDER — LIDOCAINE HCL (PF) 1 % IJ SOLN: 5.0000 mL | INTRAMUSCULAR | Status: DC | PRN

## 2019-07-21 MED ORDER — ASPIRIN EC 81 MG PO TBEC
81.0000 mg | DELAYED_RELEASE_TABLET | Freq: Every day | ORAL | Status: DC
  Administered 2019-07-21: 81 mg via ORAL

## 2019-07-21 MED ORDER — SODIUM CHLORIDE 0.9 % IV SOLN: 100.0000 mL | INTRAVENOUS | Status: DC | PRN

## 2019-07-21 MED ORDER — SEVELAMER CARBONATE 800 MG PO TABS
3200.0000 mg | ORAL_TABLET | Freq: Three times a day (TID) | ORAL | Status: DC
  Administered 2019-07-21 (×2): 3200 mg via ORAL
  Filled 2019-07-21 (×2): qty 4

## 2019-07-21 MED ORDER — PENTAFLUOROPROP-TETRAFLUOROETH EX AERO: 1.0000 "application " | INHALATION_SPRAY | CUTANEOUS | Status: DC | PRN

## 2019-07-21 MED ORDER — PREDNISONE 10 MG PO TABS: 5.0000 mg | ORAL_TABLET | Freq: Every day | ORAL | Status: DC

## 2019-07-21 MED ORDER — ISOSORBIDE MONONITRATE ER 60 MG PO TB24
60.0000 mg | ORAL_TABLET | Freq: Every day | ORAL | Status: DC
  Administered 2019-07-21: 60 mg via ORAL

## 2019-07-21 MED ORDER — ROSUVASTATIN CALCIUM 20 MG PO TABS
20.0000 mg | ORAL_TABLET | Freq: Every day | ORAL | Status: DC
  Administered 2019-07-21: 20 mg via ORAL

## 2019-07-21 MED ORDER — METOPROLOL SUCCINATE ER 25 MG PO TB24
25.0000 mg | ORAL_TABLET | Freq: Every day | ORAL | Status: DC
  Administered 2019-07-21: 25 mg via ORAL

## 2019-07-21 MED ORDER — DOXYCYCLINE HYCLATE 100 MG PO TABS: 100.0000 mg | ORAL_TABLET | Freq: Two times a day (BID) | ORAL | 0 refills | Status: AC

## 2019-07-21 MED ORDER — CHLORHEXIDINE GLUCONATE CLOTH 2 % EX PADS: 6.0000 | MEDICATED_PAD | Freq: Every day | CUTANEOUS | Status: DC

## 2019-07-21 MED ORDER — TACROLIMUS 0.5 MG PO CAPS
0.5000 mg | ORAL_CAPSULE | Freq: Every day | ORAL | Status: DC
  Administered 2019-07-21: 0.5 mg via ORAL

## 2019-07-21 MED ORDER — PREDNISONE 5 MG PO TABS: 5.0000 mg | ORAL_TABLET | Freq: Every day | ORAL | Status: AC

## 2019-07-21 NOTE — Plan of Care (Signed)

## 2019-07-21 NOTE — Progress Notes (Signed)
Pt discharged via WC to POV with belongings in her possession.

## 2019-07-21 NOTE — Progress Notes (Signed)
Pt ambulated entire length of hallway with only standby assistance. SaO2 on room air 97% at rest, 94% with ambulation. Pt tolerated well, denies dyspnea or lightheadedness. Pt wishes to go home tonight. MD [facility] notified.

## 2019-07-21 NOTE — Consult Note (Signed)
[facility] Renal Consultation Note    Indication for Consultation:  Management of ESRD/hemodialysis; anemia, hypertension/volume and secondary hyperparathyroidism  HPI: [name] is a 64 y.o. female.  Pt has a PMH significant for HTN, CHF, pulmonary HTN, DM, CAD, ASCVD, and ESRD s/p failed transplant now on HD MWF at [facility], who presented to [facility] ED with shortness of breath that started around 9:30 on the morning of 07/20/19.  In the ED a CXR revealed possible pneumonia vs asymmetric pulmonary edema of the right mid lung.  She was admitted for CAP and we were consulted to provide HD during her inpatient stay.  She was noted to have some hyperkalemia but was treated with lokelma.  She completed her full HD session on Friday and does not think she has any extra volume on.  She denies any CP, peripheral edema, orthopnea, or PND.  No N/V/D, F/C or productive cough.   Past Medical History:  Diagnosis Date  . Anemia    of chronic disease  . Blood transfusion without reported diagnosis   . Cardiovascular disease    nonobstructive  . Carotid artery stenosis 2008  . CHF (congestive heart failure) ([place])   . Coronary artery disease   . Diabetes mellitus   . Dyslipidemia   . Edema of lower extremity    with hypo-albuminemia and profound protenuria  . Heart murmur   . Hypertension   . Mitral regurgitation   . Nephrotic syndrome   . Patent foramen ovale   . Pulmonary hypertension, moderate to severe ([place])   . Pulmonary nodule   . Tricuspid regurgitation   . Volume depletion, renal, due to output loss (renal deficit)    Past Surgical History:  Procedure Laterality Date  . A/V FISTULAGRAM N/A 04/05/2017   Procedure: A/V FISTULAGRAM - Left Arm;  Surgeon: Angelia Mould, MD;  Location: [place] CV LAB;  Service: Cardiovascular;  Laterality: N/A;  . A/V FISTULAGRAM Left 09/18/2017   Procedure: A/V FISTULAGRAM;  Surgeon: Serafina Mitchell, MD;  Location: [place]  CV LAB;  Service: Cardiovascular;  Laterality: Left;  . A/V SHUNT INTERVENTION Left 04/05/2017   Procedure: A/V SHUNT INTERVENTION;  Surgeon: Angelia Mould, MD;  Location: [place] CV LAB;  Service: Cardiovascular;  Laterality: Left;  . CARDIAC CATHETERIZATION  2008  . IR REMOVAL TUN CV CATH W/O FL  11/02/2016  . 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 PERIPHERAL VASCULAR BALLOON ANGIOPLASTY Left 09/18/2017   Procedure: PERIPHERAL VASCULAR BALLOON ANGIOPLASTY;  Surgeon: Serafina Mitchell, MD;  Location: [place] CV LAB;  Service: Cardiovascular;  Laterality: Left;  Arm Fistula   Family History:   Family History  Problem Relation Age of Onset  . Kidney disease Mother   . Diabetes Mother   . Diabetes Father   . Heart disease Father    Social History:  reports that she has never smoked. She has never used smokeless tobacco. She reports that she does not drink alcohol or use drugs. Allergies  Allergen Reactions  . Other Other (See Comments)    Tape Bruises and tears skin, Paper tape tolerated  . Tape Other (See Comments)    Bruises and tears skin Paper tape tolerated Bruises and tears skin Paper tape tolerated Bruises and tears skin Paper tape tolerated   Prior to Admission medications   Medication Sig Start Date End Date Taking? Authorizing Provider  acetaminophen (TYLENOL) 500 MG tablet Take 500 mg by mouth every 6 (six) hours as  needed for moderate pain or headache.  04/06/16  Yes [provider]  aspirin (ASPIR-LOW) 81 MG EC tablet Take 1 tablet (81 mg total) by mouth daily with breakfast. 05/27/19  Yes Emokpae, Courage, MD  B Complex-C-Folic Acid (RENA-VITE RX) 1 MG TABS Take 1 tablet by mouth daily. 09/24/18  Yes [provider]  cloNIDine (CATAPRES) 0.1 MG tablet Take 1 tablet (0.1 mg total) by mouth daily. 07/08/19  Yes Hawks, Christy A, FNP  insulin degludec (TRESIBA FLEXTOUCH) 100 UNIT/ML FlexTouch Pen Inject 0.15 mLs (15 Units total) into the skin  daily. 07/08/19  Yes Hawks, Christy A, FNP  isosorbide mononitrate (IMDUR) 60 MG 24 hr tablet Take 1 tablet (60 mg total) by mouth daily. 07/08/19  Yes Hawks, Christy A, FNP  metoprolol succinate (TOPROL-XL) 25 MG 24 hr tablet Take 1 tablet (25 mg total) by mouth daily. 07/08/19  Yes Hawks, Christy A, FNP  ondansetron (ZOFRAN) 4 MG tablet Take 1 tablet (4 mg total) by mouth every 8 (eight) hours as needed for nausea or vomiting. 07/08/19  Yes Hawks, Christy A, FNP  PROGRAF 0.5 MG capsule Take 0.5 mg by mouth daily. Take 0.5mg  by mouth each morning. 10/24/16  Yes [provider]  rosuvastatin (CRESTOR) 20 MG tablet Take 1 tablet (20 mg total) by mouth daily. 07/08/19  Yes Hawks, Christy A, FNP  senna (SENOKOT) 8.6 MG TABS tablet Take 1 tablet (8.6 mg total) by mouth 2 (two) times daily. 05/27/19  Yes Roxan Hockey, MD  sevelamer carbonate (RENVELA) 800 MG tablet Take 1,600-3,200 mg by mouth 5 (five) times daily. Patient takes 4 tablets(3200mg ) 3 times a day with meals and 2 tablets(1600mg ) 2 times a day with snacks 10/02/16  Yes [provider]  Dulaglutide (TRULICITY) 1.5 YI/9.4WN SOPN INJECT 1.5MG   SUBCUTANEOUSLY ONCE A WEEK 07/08/19   Sharion Balloon, FNP   Current Facility-Administered Medications  Medication Dose Route Frequency Provider Last Rate Last Admin  . acetaminophen (TYLENOL) tablet 650 mg  650 mg Oral Q6H PRN Emokpae, Ejiroghene E, MD       Or  . acetaminophen (TYLENOL) suppository 650 mg  650 mg Rectal Q6H PRN Emokpae, Ejiroghene E, MD      . aspirin EC tablet 81 mg  81 mg Oral Q breakfast Barton Dubois, MD      . cefTRIAXone (ROCEPHIN) 1 g in sodium chloride 0.9 % 100 mL IVPB  1 g Intravenous Q24H Emokpae, Ejiroghene E, MD      . cloNIDine (CATAPRES) tablet 0.1 mg  0.1 mg Oral Daily Barton Dubois, MD      . doxycycline (VIBRAMYCIN) 100 mg in sodium chloride 0.9 % 250 mL IVPB  100 mg Intravenous Q12H Emokpae, Ejiroghene E, MD 125 mL/hr at 07/20/19 2241 100 mg at 07/20/19  2241  . guaiFENesin-dextromethorphan (ROBITUSSIN DM) 100-10 MG/5ML syrup 10 mL  10 mL Oral Q8H Emokpae, Ejiroghene E, MD   10 mL at 07/21/19 0619  . heparin injection 5,000 Units  5,000 Units Subcutaneous Q8H Emokpae, Ejiroghene E, MD   5,000 Units at 07/21/19 0620  . insulin aspart (novoLOG) injection 0-5 Units  0-5 Units Subcutaneous QHS Emokpae, Ejiroghene E, MD      . insulin aspart (novoLOG) injection 0-9 Units  0-9 Units Subcutaneous TID WC Emokpae, Ejiroghene E, MD      . insulin glargine (LANTUS) injection 8 Units  8 Units Subcutaneous QHS Emokpae, Ejiroghene E, MD   8 Units at 07/20/19 2242  . isosorbide mononitrate (IMDUR) 24  hr tablet 60 mg  60 mg Oral Daily Barton Dubois, MD      . metoprolol succinate (TOPROL-XL) 24 hr tablet 25 mg  25 mg Oral Daily Barton Dubois, MD      . ondansetron [facility]) tablet 4 mg  4 mg Oral Q6H PRN Emokpae, Ejiroghene E, MD       Or  . ondansetron (ZOFRAN) injection 4 mg  4 mg Intravenous Q6H PRN Emokpae, Ejiroghene E, MD      . polyethylene glycol (MIRALAX / GLYCOLAX) packet 17 g  17 g Oral Daily PRN Emokpae, Ejiroghene E, MD      . Derrill Memo ON 07/22/2019] predniSONE (DELTASONE) tablet 5 mg  5 mg Oral Q breakfast Barton Dubois, MD      . rosuvastatin (CRESTOR) tablet 20 mg  20 mg Oral Daily Barton Dubois, MD      . sevelamer carbonate (RENVELA) tablet 1,600 mg  1,600 mg Oral BID BM Barton Dubois, MD      . sevelamer carbonate (RENVELA) tablet 3,200 mg  3,200 mg Oral TID WC Barton Dubois, MD      . tacrolimus (PROGRAF) capsule 0.5 mg  0.5 mg Oral Daily Barton Dubois, MD       Labs: Basic Metabolic Panel: Recent Labs  Lab 07/20/19 1328 07/20/19 1354  NA 138 139  K 6.0* 5.6*  CL 99 100  CO2 28  --   GLUCOSE 96 89  BUN 34* 32*  CREATININE 7.36* 7.40*  CALCIUM 8.6*  --    Liver Function Tests: Recent Labs  Lab 07/20/19 1328  AST 53*  ALT 22  ALKPHOS 89  BILITOT 1.2  PROT 7.2  ALBUMIN 3.2*   No results for input(s): LIPASE, AMYLASE in  the last 168 hours. No results for input(s): AMMONIA in the last 168 hours. CBC: Recent Labs  Lab 07/20/19 1346 07/20/19 1354 07/21/19 0505  WBC 12.0*  --  10.2  NEUTROABS 9.7*  --   --   HGB 10.3* 10.5* 8.5*  HCT 35.0* 31.0* 29.0*  MCV 97.5  --  97.6  PLT 212  --  178   Cardiac Enzymes: No results for input(s): CKTOTAL, CKMB, CKMBINDEX, TROPONINI in the last 168 hours. CBG: Recent Labs  Lab 07/20/19 2119 07/21/19 0740  GLUCAP 133* 167*   Iron Studies: No results for input(s): IRON, TIBC, TRANSFERRIN, FERRITIN in the last 72 hours. Studies/Results: DG Chest Port 1 View  Result Date: 07/20/2019 CLINICAL DATA:  Shortness of breath EXAM: PORTABLE CHEST 1 VIEW COMPARISON:  05/26/2019 FINDINGS: Stable mild cardiomegaly. Atherosclerotic calcification of the aortic knob. Prominent interstitial markings bilaterally with patchy opacity in the right mid lung. No pleural effusion or pneumothorax. No acute osseous findings. IMPRESSION: Prominent interstitial markings bilaterally with patchy opacity in the right mid lung. Findings may reflect asymmetric edema versus atypical/viral infection. Electronically Signed   By: Davina Poke D.O.   On: 07/20/2019 13:51    ROS: Pertinent items are noted in HPI. Physical Exam: Vitals:   07/20/19 1752 07/20/19 2122 07/21/19 0423 07/21/19 0600  BP: (!) 163/63 (!) 150/60 (!) 163/64 (!) 158/62  Pulse: 66 78 63 65  Resp: 18 18 16 16   Temp: 98.4 F (36.9 C) 98 F (36.7 C) 98.1 F (36.7 C) 98 F (36.7 C)  TempSrc:   Oral Oral  SpO2: 99% 99% 99% 99%  Weight:      Height:          Weight change:   Intake/Output Summary (Last  24 hours) at 07/21/2019 0908 Last data filed at 07/21/2019 0300 Gross per 24 hour  Intake 844.63 ml  Output --  Net 844.63 ml   BP (!) 158/62 (BP Location: Right Arm)   Pulse 65   Temp 98 F (36.7 C) (Oral)   Resp 16   Ht 5\' 9"  (1.753 m)   Wt 70.3 kg   SpO2 99%   BMI 22.89 kg/m  General appearance: alert,  cooperative and no distress Head: Normocephalic, without obvious abnormality, atraumatic Eyes: negative findings: lids and lashes normal, conjunctivae and sclerae normal and corneas clear Resp: clear to auscultation bilaterally Cardio: regular rate and rhythm, S1, S2 normal, no murmur, click, rub or gallop GI: soft, non-tender; bowel sounds normal; no masses,  no organomegaly Extremities: extremities normal, atraumatic, no cyanosis or edema and LAVF +T/B Dialysis Access: LAVF  Dialysis Orders: Center: [place]  on MWF . EDW 68kg HD [place] 2K/2.5Ca  Time 4 hours Heparin none. Access LAVF BFR 300 DFR 600    16 gauge needles, EPO 6,000 IV tiw, hectoral 3.5 mcg IVP tiw, venofer 50 mg IV once a week  Assessment/Plan: 1.  Pneumonia- Tmax 99.3, elevated WBC, negative covid-19.  On ceftriaxone and doxycycline per primary svc 2.  ESRD -   Will plan for HD today. 3.  Hypertension/volume  -  Will UF as tolerated 4.  Anemia  - Hgb dropped since admission.  Will dose with ESA and follow 5.  Metabolic bone disease -   Continue with home meds 6.  Nutrition -  Renal diet, carb-modified. 7. Hyperkalemia- plan for HD today and she was given lokelma  Donetta Potts, MD [place] Pager [phone] 07/21/2019, 9:08 AM

## 2019-07-21 NOTE — Discharge Summary (Signed)
Physician Discharge Summary  [name]:[id number] DOB: [birth year] DOA: 07/20/2019  PCP: Sharion Balloon, FNP  Admit date: 07/20/2019 Discharge date: 07/21/2019  Time spent: 35 minutes  Recommendations for Outpatient Follow-up:  Repeat basic metabolic panel to follow electrolytes stability -Repeat CBC to follow hemoglobin trend Repeat chest x-ray in 6 weeks to assure complete resolution of infiltrates.  Discharge Diagnoses:  Principal Problem:   Pneumonia Active Problems:   Essential hypertension   Chronic diastolic heart failure (HCC)   Deceased-donor kidney transplant recipient   Type 2 diabetes mellitus ([place]) Secondary hyperparathyroidism -Anemia of chronic disease -Prior history of kidney transplant.  Discharge Condition: Stable and improved.  Patient discharged home with instruction to follow-up with PCP and to resume outpatient hemodialysis management.  CODE STATUS: Full code  Diet recommendation: Heart healthy/modified carbohydrate diet.  Filed Weights   07/20/19 1256 07/21/19 1130 07/21/19 1530  Weight: 70.3 kg 69.9 kg 67.8 kg    History of present illness:  As per H&P written by Dr. Denton Blackwell on 07/20/2019 64 y.o. female with medical history significant for ESRD, diabetes mellitus, coronary artery disease, diastolic CHF, hypertension. Patient presented to the ED with complaints of difficulty breathing that started this morning when she woke up, she also reports chest pain-nonradiating, left-sided, that started about 11 AM this morning, and persisted till she got to the ED. She describes pain as pressure-like. She is unaware of a history of heart attacks. She reports onset of a dry cough that started yesterday. She denies lower extremity swelling. She has been consistent with her HD schedule of Monday Wednesday Friday has been completing her sessions. Last hemodialysis was Friday. Patient has completed her COVID-19 vaccination. At this time patient is chest  pain-free.  ED Course: T-max 99.3, blood pressure 195/60, O2 sats 95% on room air.  WBC 12. Covid PCR test negative. Portable chest x-ray with prominent interstitial markings bilaterally with patchy opacity in right midlung-asymmetric edema versus atypical/viral infection. K elevated at 6. Lactic acid within normal limits 1.7. Troponin 68 > 67, consistent with prior. EKG shows some ST depressions in lateral leads-V5 V6 that improved on subsequent EKG. Patient was started on IV Vanco and cefepime for broad coverage, due to immunocompromised status. Hospitalist to admit for pneumonia.  Hospital Course:  1-community-acquired pneumonia: Affecting right middle lobe: -Afebrile and with normalization of WBCs prior to discharge -No requiring oxygen supplementation -No nausea or vomiting and capable of complete antibiotic therapy orally. -Patient will be discharged on doxycycline with intention to complete 7 more days of antibiotics. -Continue to use as needed Tylenol for comfort and as antipyretics. -Covid test was negative.  2-pleuritic chest pain -In the setting of pneumonia -No acute ischemic changes appreciated on EKG or telemetry -Continue aspirin and statins -Continue patient follow-up with primary cardiology service. -Advised to follow heart healthy diet.  3-end-stage renal disease -Potassium elevated at 6.0 on presentation -Lokelma x1 dose given and patient received dialysis prior to being discharged. -We will resume outpatient follow-up with her nephrology service and future to schedule dialysis as per outpatient schedule (Monday-Wednesday and Friday). -Patient advised to maintain adequate hydration and to follow heart healthy/low-sodium diet.  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 -Continue volume management with hemodialysis -Advised to follow daily weights and low-sodium  diet.  5-essential hypertension -A stable and well-controlled -Continue current antihypertensive regimen.  6-type 2 diabetes with nephropathy -Resume Trulicity and home hypoglycemic regimen -  Outpatient follow-up with PCP to further assess CBGs and A1c. -Advised to follow modified carbohydrate diet.  7-secondary hyperparathyroidism -Continue the use of Renvela  8-prior history of kidney transplant -Requiring dialysis at this point -Continue the use of Prograf and home dose prednisone  9-anemia of chronic kidney disease -Continue the use of IV iron and ESA per nephrology discretion.  Procedures: See below for x-ray reports  Consultations:  Nephrology service  Discharge Exam: Vitals:   07/21/19 1700 07/21/19 1707  BP: (!) 159/59   Pulse: 66 77  Resp: 16 18  Temp:    SpO2: 97% 93%    General: Afebrile, denies chest pain, no nausea, no vomiting.  Reports improvement in her breathing overall and feels ready to go home.  Tolerated dialysis treatment without complications while hospitalized. Cardiovascular: S1-S2, no rubs, no gallops, no JVD. Respiratory: Good air movement bilaterally, positive scattered rhonchi (right more than left); no using accessory muscles, no wheezing, normal respiratory effort.  Good oxygen saturation on room air appreciated (at rest and on exertion). Abdomen: Soft, nontender, distended, positive bowel sounds Extremities: Trace edema bilaterally; no cyanosis or clubbing.  Discharge Instructions   Discharge Instructions    (HEART FAILURE PATIENTS) Call MD:  Anytime you have any of the following symptoms: 1) 3 pound weight gain in 24 hours or 5 pounds in 1 week 2) shortness of breath, with or without a dry hacking cough 3) swelling in the hands, feet or stomach 4) if you have to sleep on extra pillows at night in order to breathe.   Complete by: As directed    Diet - low sodium heart healthy   Complete by: As directed    Discharge instructions    Complete by: As directed    Follow heart healthy modified carbohydrate diet The medications are prescribed Arrange follow-up with PCP in 10 days Resume outpatient hemodialysis therapy as previously scheduled (Monday, Wednesday, Friday). Maintain adequate hydration.   Increase activity slowly   Complete by: As directed      Allergies as of 07/21/2019      Reactions   Other Other (See Comments)   Tape Bruises and tears skin, Paper tape tolerated   Tape Other (See Comments)   Bruises and tears skin Paper tape tolerated Bruises and tears skin Paper tape tolerated Bruises and tears skin Paper tape tolerated      Medication List    TAKE these medications   acetaminophen 500 MG tablet Commonly known as: TYLENOL Take 500 mg by mouth every 6 (six) hours as needed for moderate pain or headache.   aspirin 81 MG EC tablet Commonly known as: Aspir-Low Take 1 tablet (81 mg total) by mouth daily with breakfast.   cloNIDine 0.1 MG tablet Commonly known as: CATAPRES Take 1 tablet (0.1 mg total) by mouth daily.   doxycycline 100 MG tablet Commonly known as: VIBRA-TABS Take 1 tablet (100 mg total) by mouth 2 (two) times daily for 7 days.   isosorbide mononitrate 60 MG 24 hr tablet Commonly known as: IMDUR Take 1 tablet (60 mg total) by mouth daily.   metoprolol succinate 25 MG 24 hr tablet Commonly known as: TOPROL-XL Take 1 tablet (25 mg total) by mouth daily.   ondansetron 4 MG tablet Commonly known as: Zofran Take 1 tablet (4 mg total) by mouth every 8 (eight) hours as needed for nausea or vomiting.   predniSONE 5 MG tablet Commonly known as: DELTASONE Take 1 tablet (5 mg total) by mouth daily with  breakfast. Start taking on: Jul 22, 2019   Prograf 0.5 MG capsule Generic drug: tacrolimus Take 0.5 mg by mouth daily. Take 0.5mg  by mouth each morning.   Rena-Vite Rx 1 MG Tabs Take 1 tablet by mouth daily.   rosuvastatin 20 MG tablet Commonly known as: CRESTOR Take 1  tablet (20 mg total) by mouth daily.   senna 8.6 MG Tabs tablet Commonly known as: SENOKOT Take 1 tablet (8.6 mg total) by mouth 2 (two) times daily.   sevelamer carbonate 800 MG tablet Commonly known as: RENVELA Take 1,600-3,200 mg by mouth 5 (five) times daily. Patient takes 4 tablets(3200mg ) 3 times a day with meals and 2 tablets(1600mg ) 2 times a day with snacks   Tresiba FlexTouch 100 UNIT/ML FlexTouch Pen Generic drug: insulin degludec Inject 0.15 mLs (15 Units total) into the skin daily.   Trulicity 1.5 RS/8.5IO Sopn Generic drug: Dulaglutide INJECT 1.5MG   SUBCUTANEOUSLY ONCE A WEEK      Allergies  Allergen Reactions  . Other Other (See Comments)    Tape Bruises and tears skin, Paper tape tolerated  . Tape Other (See Comments)    Bruises and tears skin Paper tape tolerated Bruises and tears skin Paper tape tolerated Bruises and tears skin Paper tape tolerated   Follow-up Information    Sharion Balloon, FNP. Schedule an appointment as soon as possible for a visit in 10 day(s).   Specialty: Family Medicine Contact information: [place] [address] [phone]           The results of significant diagnostics from this hospitalization (including imaging, microbiology, ancillary and laboratory) are listed below for reference.    Significant Diagnostic Studies: DG Chest Port 1 View  Result Date: 07/20/2019 CLINICAL DATA:  Shortness of breath EXAM: PORTABLE CHEST 1 VIEW COMPARISON:  05/26/2019 FINDINGS: Stable mild cardiomegaly. Atherosclerotic calcification of the aortic knob. Prominent interstitial markings bilaterally with patchy opacity in the right mid lung. No pleural effusion or pneumothorax. No acute osseous findings. IMPRESSION: Prominent interstitial markings bilaterally with patchy opacity in the right mid lung. Findings may reflect asymmetric edema versus atypical/viral infection. Electronically Signed   By: Davina Poke D.O.    On: 07/20/2019 13:51    Microbiology: Recent Results (from the past 240 hour(s))  SARS Coronavirus 2 by RT PCR (hospital order, performed in [facility] hospital lab) Nasopharyngeal Nasopharyngeal Swab     Status: None   Collection Time: 07/20/19  1:22 PM   Specimen: Nasopharyngeal Swab  Result Value Ref Range Status   SARS Coronavirus 2 NEGATIVE NEGATIVE Final    Comment: (NOTE) SARS-CoV-2 target nucleic acids are NOT DETECTED. The SARS-CoV-2 RNA is generally detectable in upper and lower respiratory specimens during the acute phase of infection. The lowest concentration of SARS-CoV-2 viral copies this assay can detect is 250 copies / mL. A negative result does not preclude SARS-CoV-2 infection and should not be used as the sole basis for treatment or other patient management decisions.  A negative result may occur with improper specimen collection / handling, submission of specimen other than nasopharyngeal swab, presence of viral mutation(s) within the areas targeted by this assay, and inadequate number of viral copies (<250 copies / mL). A negative result must be combined with clinical observations, patient history, and epidemiological information. Fact Sheet for Patients:   StrictlyIdeas.no Fact Sheet for Healthcare Providers: BankingDealers.co.za This test is not yet approved or cleared  by the [place] FDA and has been authorized for detection  and/or diagnosis of SARS-CoV-2 by FDA under an Emergency Use Authorization (EUA).  This EUA will remain in effect (meaning this test can be used) for the duration of the COVID-19 declaration under Section 564(b)(1) of the Act, 21 U.S.C. section 360bbb-3(b)(1), unless the authorization is terminated or revoked sooner. Performed at [facility], [address], [place], [place] [address]   Blood Culture (routine x 2)     Status: None (Preliminary result)   Collection Time: 07/20/19   2:40 PM   Specimen: BLOOD RIGHT FOREARM  Result Value Ref Range Status   Specimen Description   Final    BLOOD RIGHT FOREARM BOTTLES DRAWN AEROBIC AND ANAEROBIC   Special Requests Blood Culture adequate volume  Final   Culture  Setup Time PENDING  Incomplete   Culture   Final    NO GROWTH < 24 HOURS Performed at [facility], [address], [place], [place] [address]    Report Status PENDING  Incomplete  MRSA PCR Screening     Status: None   Collection Time: 07/20/19  3:57 PM   Specimen: Nasal Mucosa; Nasopharyngeal  Result Value Ref Range Status   MRSA by PCR NEGATIVE NEGATIVE Final    Comment:        The GeneXpert MRSA Assay (FDA approved for NASAL specimens only), is one component of a comprehensive MRSA colonization surveillance program. It is not intended to diagnose MRSA infection nor to guide or monitor treatment for MRSA infections. Performed at [facility] [address], [place], [place] [address]      Labs: Basic Metabolic Panel: Recent Labs  Lab 07/20/19 1328 07/20/19 1354  NA 138 139  K 6.0* 5.6*  CL 99 100  CO2 28  --   GLUCOSE 96 89  BUN 34* 32*  CREATININE 7.36* 7.40*  CALCIUM 8.6*  --    Liver Function Tests: Recent Labs  Lab 07/20/19 1328  AST 53*  ALT 22  ALKPHOS 89  BILITOT 1.2  PROT 7.2  ALBUMIN 3.2*   CBC: Recent Labs  Lab 07/20/19 1346 07/20/19 1354 07/21/19 0505  WBC 12.0*  --  10.2  NEUTROABS 9.7*  --   --   HGB 10.3* 10.5* 8.5*  HCT 35.0* 31.0* 29.0*  MCV 97.5  --  97.6  PLT 212  --  178   BNP (last 3 results) Recent Labs    12/15/18 1229 12/27/18 0910 05/25/19 1351  BNP >4,500.0* >4,500.0* 3,037.0*    CBG: Recent Labs  Lab 07/20/19 2119 07/21/19 0740 07/21/19 1108 07/21/19 1615  GLUCAP 133* 167* 192* 163*    Signed:  Barton Dubois MD.  [facility] 07/21/2019, 5:11 PM   .

## 2019-07-21 NOTE — Procedures (Signed)
     HEMODIALYSIS TREATMENT NOTE:  3.5 hour heparin-free HD completed via left forearm AVF (16g/antegrade). Goal met: 2.7L removed without interruption in UF.  Rocephin and Doxycycline were given last hour of HD due to loss of PIV.  All blood was returned and hemostasis was achieved in 15 minutes.  Saturating 99% on RA at rest.  Will ambulate with primary RN later this afternoon.  [name], RN

## 2019-07-22 ENCOUNTER — Telehealth: Payer: Self-pay

## 2019-07-22 NOTE — Telephone Encounter (Signed)
    Transitional Care Management  Contact Attempt Attempt Date:07/22/2019 Attempted By:  Felicity Coyer  1st unsuccessful TCM contact attempt.   I reached out to [name] on her preferred telephone number to discuss Transitional Care Management, medication reconciliation, and to schedule a TCM hospital follow-up with her PCP at [facility]  Discharge Date: 07/21/2019 Location: [facility] Discharge Dx: Shortness of Breath  Recommendations for Outpatient Follow-up:  Follow up with PCP in 10 days   Plan I was unable to leave a  HIPPA compliant message, patient's voicemail was not set up.  Will attempt to contact again within the 2 business day post discharge window if she does not return my call.

## 2019-07-23 DIAGNOSIS — Z992 Dependence on renal dialysis: Secondary | ICD-10-CM | POA: Diagnosis not present

## 2019-07-23 DIAGNOSIS — N186 End stage renal disease: Secondary | ICD-10-CM | POA: Diagnosis not present

## 2019-07-23 NOTE — Telephone Encounter (Signed)
    Transitional Care Management  Contact Attempt Attempt Date:07/23/2019 Attempted By: Brynda Peon CMA  2nd unsuccessful TCM contact attempt.   I reached out to Rockey Situ on her preferred telephone number to discuss Transitional Care Management, medication reconciliation, and to schedule a TCM hospital follow-up with her PCP at [facility].  Discharge Date: 07/21/2019  Location: [facility] Discharge Dx: SOB  Recommendations for Outpatient Follow-up:  Recommendations for Outpatient Follow-up:  Repeat basic metabolic panel to follow electrolytes stability -Repeat CBC to follow hemoglobin trend Repeat chest x-ray in 6 weeks to assure complete resolution of infiltrates. (insert from discharge summary)   Plan I left a HIPPA compliant message for her to return my call.  Will attempt to contact again within the 2 business day post discharge window if she does not return my call.

## 2019-07-25 DIAGNOSIS — N186 End stage renal disease: Secondary | ICD-10-CM | POA: Diagnosis not present

## 2019-07-25 DIAGNOSIS — Z992 Dependence on renal dialysis: Secondary | ICD-10-CM | POA: Diagnosis not present

## 2019-07-25 NOTE — Telephone Encounter (Signed)
TRANSITIONAL CARE MANAGEMENT TELEPHONE OUTREACH NOTE   Contact Date: 07/25/2019 Contacted By: Lynnea Ferrier, LPN    DISCHARGE INFORMATION Date of Discharge:07/21/19 Discharge Facility: [facility] Principal Discharge Diagnosis:Pneumonia   Outpatient Follow Up Recommendations (copied from discharge summary) Repeat basic metabolic panel to follow electrolytes stability -Repeat CBC to follow hemoglobin trend Repeat chest x-ray in 6 weeks to assure complete resolution of infiltrates  [name] is a female primary care patient of Sharion Balloon, FNP. An outgoing telephone call was made today and I spoke with patient.  [name] condition(s) and treatment(s) were discussed. An opportunity to ask questions was provided and all were answered or forwarded as appropriate.    ACTIVITIES OF DAILY LIVING  [name] lives with their spouse and she can perform ADLs independently. her primary caregiver is daughter. she is able to depend on her primary caregiver(s) for consistent help. Transportation to appointments, to pick up medications, and to run errands is not a problem.  (Consider referral to [facility] if transportation or a consistent caregiver is a problem)   Fall Risk Fall Risk  07/08/2019 04/22/2019  Falls in the past year? 0 0  Number falls in past yr: - -  Injury with Fall? - -  Comment - -    low Ovid Modifications/Assistive Devices Wheelchair: No Cane: Yes if needed Ramp: No Bedside Toilet: No Hospital Bed:  No Other:    Sam [place] she is not receiving home health services.    MEDICATION RECONCILIATION  [name] has been able to pick-up all prescribed discharge medications from the pharmacy.   A post discharge medication reconciliation was performed and the complete medication list was reviewed with the patient/caregiver and is current as of 07/25/2019. Changes highlighted below.  Discontinued Medications None  Current Medication  List Allergies as of 07/22/2019      Reactions   Other Other (See Comments)   Tape Bruises and tears skin, Paper tape tolerated   Tape Other (See Comments)   Bruises and tears skin Paper tape tolerated Bruises and tears skin Paper tape tolerated Bruises and tears skin Paper tape tolerated      Medication List       Accurate as of Jul 22, 2019 11:59 PM. If you have any questions, ask your nurse or doctor.        acetaminophen 500 MG tablet Commonly known as: TYLENOL Take 500 mg by mouth every 6 (six) hours as needed for moderate pain or headache.   aspirin 81 MG EC tablet Commonly known as: Aspir-Low Take 1 tablet (81 mg total) by mouth daily with breakfast.   cloNIDine 0.1 MG tablet Commonly known as: CATAPRES Take 1 tablet (0.1 mg total) by mouth daily.   doxycycline 100 MG tablet Commonly known as: VIBRA-TABS Take 1 tablet (100 mg total) by mouth 2 (two) times daily for 7 days.   isosorbide mononitrate 60 MG 24 hr tablet Commonly known as: IMDUR Take 1 tablet (60 mg total) by mouth daily.   metoprolol succinate 25 MG 24 hr tablet Commonly known as: TOPROL-XL Take 1 tablet (25 mg total) by mouth daily.   ondansetron 4 MG tablet Commonly known as: Zofran Take 1 tablet (4 mg total) by mouth every 8 (eight) hours as needed for nausea or vomiting.   predniSONE 5 MG tablet Commonly known as: DELTASONE Take 1 tablet (5 mg total) by mouth daily with breakfast.   Prograf 0.5 MG  capsule Generic drug: tacrolimus Take 0.5 mg by mouth daily. Take 0.5mg  by mouth each morning.   Rena-Vite Rx 1 MG Tabs Take 1 tablet by mouth daily.   rosuvastatin 20 MG tablet Commonly known as: CRESTOR Take 1 tablet (20 mg total) by mouth daily.   senna 8.6 MG Tabs tablet Commonly known as: SENOKOT Take 1 tablet (8.6 mg total) by mouth 2 (two) times daily.   sevelamer carbonate 800 MG tablet Commonly known as: RENVELA Take 1,600-3,200 mg by mouth 5 (five) times daily. Patient  takes 4 tablets(3200mg ) 3 times a day with meals and 2 tablets(1600mg ) 2 times a day with snacks   Tresiba FlexTouch 100 UNIT/ML FlexTouch Pen Generic drug: insulin degludec Inject 0.15 mLs (15 Units total) into the skin daily.   Trulicity 1.5 ET/6.2OE Sopn Generic drug: Dulaglutide INJECT 1.5MG   SUBCUTANEOUSLY ONCE A WEEK        PATIENT EDUCATION & FOLLOW-UP PLAN  An appointment for Transitional Care Management is scheduled with Sharion Balloon, FNP on 07/31/19 at 4:10pm.  Take all medications as prescribed  Contact our office by calling [phone] if you have any questions or concerns.

## 2019-07-26 LAB — CULTURE, BLOOD (ROUTINE X 2)
Culture: NO GROWTH
Special Requests: ADEQUATE

## 2019-07-28 DIAGNOSIS — N186 End stage renal disease: Secondary | ICD-10-CM | POA: Diagnosis not present

## 2019-07-28 DIAGNOSIS — Z992 Dependence on renal dialysis: Secondary | ICD-10-CM | POA: Diagnosis not present

## 2019-07-30 DIAGNOSIS — N186 End stage renal disease: Secondary | ICD-10-CM | POA: Diagnosis not present

## 2019-07-30 DIAGNOSIS — Z992 Dependence on renal dialysis: Secondary | ICD-10-CM | POA: Diagnosis not present

## 2019-07-31 ENCOUNTER — Ambulatory Visit (INDEPENDENT_AMBULATORY_CARE_PROVIDER_SITE_OTHER): Payer: BC Managed Care – PPO | Admitting: Family

## 2019-07-31 ENCOUNTER — Other Ambulatory Visit: Payer: Self-pay

## 2019-07-31 ENCOUNTER — Encounter: Payer: Self-pay | Admitting: Family

## 2019-07-31 VITALS — BP 133/58 | HR 64 | Temp 99.3°F | Ht 69.0 in | Wt 152.2 lb

## 2019-07-31 DIAGNOSIS — Z09 Encounter for follow-up examination after completed treatment for conditions other than malignant neoplasm: Secondary | ICD-10-CM

## 2019-07-31 DIAGNOSIS — J020 Streptococcal pharyngitis: Secondary | ICD-10-CM

## 2019-07-31 DIAGNOSIS — J189 Pneumonia, unspecified organism: Secondary | ICD-10-CM

## 2019-07-31 MED ORDER — AMOXICILLIN 875 MG PO TABS: 875.0000 mg | ORAL_TABLET | Freq: Two times a day (BID) | ORAL | 0 refills | Status: DC

## 2019-07-31 NOTE — Progress Notes (Signed)
Subjective:    Patient ID: [name], female    DOB: [birth year], 64 y.o.   MRN: [id number]  Chief Complaint  Patient presents with  . Hospitalization Follow-up   Pt presents to the office today for hospital follow up. Pt went to the ED on 07/20/19 with SOB, chest pain. She diagnosed with CAP. She was discharged on Doxycycline. She has completed this. Her COVID test was negative.   She is on HD an gets it Monday, Wednesday, and Friday.  Sore Throat  This is a new problem. The current episode started today. The problem has been gradually worsening. Maximum temperature: 99. The pain is at a severity of 9/10. The pain is moderate. Associated symptoms include ear pain, swollen glands and trouble swallowing. Pertinent negatives include no headaches or shortness of breath. She has tried acetaminophen for the symptoms. The treatment provided mild relief.      Review of Systems  HENT: Positive for ear pain and trouble swallowing.   Respiratory: Negative for shortness of breath.   Neurological: Negative for headaches.  All other systems reviewed and are negative.      Obj ective:   Physical Exam Vitals reviewed.  Constitutional:      General: She is not in acute distress.    Appearance: She is well-developed.  HENT:     Head: Normocephalic and atraumatic.     Right Ear: Tympanic membrane normal.     Left Ear: Tympanic membrane normal.     Mouth/Throat:     Pharynx: Pharyngeal swelling and oropharyngeal exudate present.  Eyes:     Pupils: Pupils are equal, round, and reactive to light.  Neck:     Thyroid: No thyromegaly.  Cardiovascular:     Rate and Rhythm: Normal rate and regular rhythm.     Heart sounds: Murmur present.  Pulmonary:     Effort: Pulmonary effort is normal. No respiratory distress.     Breath sounds: Normal breath sounds. No wheezing.  Abdominal:     General: Bowel sounds are normal. There is no distension.     Palpations: Abdomen is soft.     Tenderness:  There is no abdominal tenderness.  Musculoskeletal:        General: No tenderness. Normal range of motion.     Cervical back: Normal range of motion and neck supple.  Skin:    General: Skin is warm and dry.  Neurological:     Mental Status: She is alert and oriented to person, place, and time.     Cranial Nerves: No cranial nerve deficit.     Deep Tendon Reflexes: Reflexes are normal and symmetric.  Psychiatric:        Behavior: Behavior normal.        Thought Content: Thought content normal.        Judgment: Judgment normal.      BP (!) 133/58   Pulse 64   Temp 99.3 F (37.4 C) (Oral)   Ht '5\' 9"'$  (1.753 m)   Wt 152 lb 4 oz (69.1 kg)   SpO2 99%   BMI 22.48 kg/m       Assessment & Plan:  [name] comes in today with chief complaint of Hospitalization Follow-up   Diagnosis and orders addressed:  1. Community acquired pneumonia, unspecified laterality -Will repeat X-ray in 5 weeks - BMP8+EGFR - CBC with Differential/Platelet - DG Chest 2 View; Future  2. Hospital discharge follow-up - BMP8+EGFR - CBC with Differential/Platelet - DG  Chest 2 View; Future  3. Strep pharyngitis - Take meds as prescribed - Use a cool mist humidifier  -Use saline nose sprays frequently -Force fluids -For any cough or congestion  Use plain Mucinex- regular strength or max strength is fine -For fever or aces or pains- take tylenol or ibuprofen. -Throat lozenges if help -New toothbrush in 3 days Call if symptoms worsen or do not improve  - BMP8+EGFR - CBC with Differential/Platelet - amoxicillin (AMOXIL) 875 MG tablet; Take 1 tablet (875 mg total) by mouth 2 (two) times daily.  Dispense: 14 tablet; Refill: 0   Labs pending  keep Dialysis appts          Evelina Dun, FNP

## 2019-07-31 NOTE — Patient Instructions (Signed)
Strep Throat, Adult Strep throat is an infection in the throat that is caused by bacteria. It is common during the cold months of the year. It mostly affects children who are 5-65 years old. However, people of all ages can get it at any time of the year. This infection spreads from person to person (is contagious) through coughing, sneezing, or having close contact. Your health care provider may use other names to describe the infection. It can be called tonsillitis (if there is swelling of the tonsils), or pharyngitis (if there is swelling at the back of the throat). What are the causes? This condition is caused by the Streptococcus pyogenes bacteria. What increases the risk? You are more likely to develop this condition if:  You care for school-age children, or are around school-age children. Children are more likely to get strep throat and may spread it to others.  You spend time in crowded places where the infection can spread easily.  You have close contact with someone who has strep throat. What are the signs or symptoms? Symptoms of this condition include:  Fever or chills.  Redness, swelling, or pain in the tonsils or throat.  Pain or difficulty when swallowing.  White or yellow spots on the tonsils or throat.  Tender glands in the neck and under the jaw.  Bad smelling breath.  Red rash all over the body. This is rare. How is this diagnosed? This condition is diagnosed by tests that check for the presence and the amount of bacteria that cause strep throat. They are:  Rapid strep test. Your throat is swabbed and checked for the presence of bacteria. Results are usually ready in minutes.  Throat culture test. Your throat is swabbed. The sample is placed in a cup that allows infections to grow. Results are usually ready in 1 or 2 days. How is this treated? This condition may be treated with:  Medicines that kill germs (antibiotics).  Medicines that relieve pain or fever.  These include: ? Ibuprofen or acetaminophen. ? Aspirin, only for patients who are over the age of 18. ? Throat lozenges. ? Throat sprays. Follow these instructions at home: Medicines   Take over-the-counter and prescription medicines only as told by your health care provider.  Take your antibiotic medicine as told by your health care provider. Do not stop taking the antibiotic even if you start to feel better. Eating and drinking   If you have trouble swallowing, try eating soft foods until your sore throat feels better.  Drink enough fluid to keep your urine pale yellow.  To help relieve pain, you may have: ? Warm fluids, such as soup and tea. ? Cold fluids, such as frozen desserts or popsicles. General instructions  Gargle with a salt-water mixture 3-4 times a day or as needed. To make a salt-water mixture, completely dissolve -1 tsp (3-6 g) of salt in 1 cup (237 mL) of warm water.  Get plenty of rest.  Stay home from work or school until you have been taking antibiotics for 24 hours.  Avoid smoking or being around people who smoke.  Keep all follow-up visits as told by your health care provider. This is important. How is this prevented?   Do not share food, drinking cups, or personal items that could cause the infection to spread to other people.  Wash your hands well with soap and water, and make sure that all people in your house wash their hands well.  Have family members tested if   they have a sore throat or fever. They may need an antibiotic if they have strep throat. Contact a health care provider if:  The glands in your neck continue to get bigger.  You develop a rash, cough, or earache.  You cough up a thick mucus that is green, yellow-brown, or bloody.  You have pain or discomfort that does not get better with medicine.  Your symptoms seem to be getting worse and not better.  You have a fever. Get help right away if:  You have new symptoms, such as  vomiting, severe headache, stiff or painful neck, chest pain, or shortness of breath.  You have severe throat pain, drooling, or changes in your voice.  You have swelling of the neck, or the skin on the neck becomes red and tender.  You have signs of dehydration, such as tiredness (fatigue), dry mouth, and decreased urination.  You become increasingly sleepy, or you cannot wake up completely.  Your joints become red or painful. Summary  Strep throat is an infection in the throat that is caused by the Streptococcus pyogenes bacteria. This infection is spread from person to person (is contagious) through coughing, sneezing, or having close contact.  Take your medicines, including antibiotics, as told by your health care provider. Do not stop taking the antibiotic even if you start to feel better.  To prevent the spread of germs, wash your hands well with soap and water. Have others do the same. Do not share food, drinking cups, or personal items.  Get help right away if you have new symptoms, such as vomiting, severe headache, stiff or painful neck, chest pain, or shortness of breath. This information is not intended to replace advice given to you by your health care provider. Make sure you discuss any questions you have with your health care provider. Document Revised: 05/10/2018 Document Reviewed: 05/10/2018 Elsevier Patient Education  2020 [employer].  

## 2019-08-01 DIAGNOSIS — N186 End stage renal disease: Secondary | ICD-10-CM | POA: Diagnosis not present

## 2019-08-01 DIAGNOSIS — Z992 Dependence on renal dialysis: Secondary | ICD-10-CM | POA: Diagnosis not present

## 2019-08-01 LAB — CBC WITH DIFFERENTIAL/PLATELET
Basophils Absolute: 0.1 10*3/uL (ref 0.0–0.2)
Basos: 0 %
EOS (ABSOLUTE): 0.3 10*3/uL (ref 0.0–0.4)
Eos: 3 %
Hematocrit: 29.7 % — ABNORMAL LOW (ref 34.0–46.6)
Hemoglobin: 9.3 g/dL — ABNORMAL LOW (ref 11.1–15.9)
Immature Grans (Abs): 0 10*3/uL (ref 0.0–0.1)
Immature Granulocytes: 0 %
Lymphocytes Absolute: 0.7 10*3/uL (ref 0.7–3.1)
Lymphs: 6 %
MCH: 28.9 pg (ref 26.6–33.0)
MCHC: 31.3 g/dL — ABNORMAL LOW (ref 31.5–35.7)
MCV: 92 fL (ref 79–97)
Monocytes Absolute: 0.7 10*3/uL (ref 0.1–0.9)
Monocytes: 6 %
Neutrophils Absolute: 9.8 10*3/uL — ABNORMAL HIGH (ref 1.4–7.0)
Neutrophils: 85 %
Platelets: 197 10*3/uL (ref 150–450)
RBC: 3.22 x10E6/uL — ABNORMAL LOW (ref 3.77–5.28)
RDW: 15.4 % (ref 11.7–15.4)
WBC: 11.6 10*3/uL — ABNORMAL HIGH (ref 3.4–10.8)

## 2019-08-01 LAB — BMP8+EGFR
BUN/Creatinine Ratio: 4 — ABNORMAL LOW (ref 12–28)
BUN: 27 mg/dL (ref 8–27)
CO2: 27 mmol/L (ref 20–29)
Calcium: 8.7 mg/dL (ref 8.7–10.3)
Chloride: 99 mmol/L (ref 96–106)
Creatinine, Ser: 6.01 mg/dL (ref 0.57–1.00)
GFR calc Af Amer: 8 mL/min/{1.73_m2} — ABNORMAL LOW (ref >59)
GFR calc non Af Amer: 7 mL/min/{1.73_m2} — ABNORMAL LOW (ref >59)
Glucose: 123 mg/dL — ABNORMAL HIGH (ref 65–99)
Potassium: 4.8 mmol/L (ref 3.5–5.2)
Sodium: 143 mmol/L (ref 134–144)

## 2019-08-04 DIAGNOSIS — N186 End stage renal disease: Secondary | ICD-10-CM | POA: Diagnosis not present

## 2019-08-04 DIAGNOSIS — Z992 Dependence on renal dialysis: Secondary | ICD-10-CM | POA: Diagnosis not present

## 2019-08-06 DIAGNOSIS — Z992 Dependence on renal dialysis: Secondary | ICD-10-CM | POA: Diagnosis not present

## 2019-08-06 DIAGNOSIS — N186 End stage renal disease: Secondary | ICD-10-CM | POA: Diagnosis not present

## 2019-08-07 ENCOUNTER — Ambulatory Visit (INDEPENDENT_AMBULATORY_CARE_PROVIDER_SITE_OTHER): Payer: BC Managed Care – PPO | Admitting: Family

## 2019-08-07 ENCOUNTER — Encounter: Payer: Self-pay | Admitting: Family

## 2019-08-07 DIAGNOSIS — B370 Candidal stomatitis: Secondary | ICD-10-CM | POA: Diagnosis not present

## 2019-08-07 MED ORDER — CLOTRIMAZOLE 10 MG MT TROC: 10.0000 mg | Freq: Every day | OROMUCOSAL | 0 refills | Status: DC

## 2019-08-07 MED ORDER — NYSTATIN 100000 UNIT/ML MT SUSP: 5.0000 mL | Freq: Four times a day (QID) | OROMUCOSAL | 1 refills | Status: DC

## 2019-08-07 NOTE — Progress Notes (Signed)
   Virtual Visit via telephone Note Due to COVID-19 pandemic this visit was conducted virtually. This visit type was conducted due to national recommendations for restrictions regarding the COVID-19 Pandemic (e.g. social distancing, sheltering in place) in an effort to limit this patient's exposure and mitigate transmission in our community. All issues noted in this document were discussed and addressed.  A physical exam was not performed with this format.  I connected with [name] on 08/07/19 at 11:00AM by telephone and verified that I am speaking with the correct person using two identifiers. [name] is currently located at home and no one is currently with her during visit. The provider, Evelina Dun, FNP is located in their office at time of visit.  I discussed the limitations, risks, security and privacy concerns of performing an evaluation and management service by telephone and the availability of in person appointments. I also discussed with the patient that there may be a patient responsible charge related to this service. The patient expressed understanding and agreed to proceed.   History and Present Illness:  HPI Pt calls the office today with mouth sores on cheeks, roof of her mouth, tongue, and inner lip that started a couple days ago. She was seen in the office on 07/31/19 and noticed her tonsils were swollen and had exudate and was given Amoxicillin. She reports her throat has greatly improved since completing the antibiotics yesterday. Denies any fever.    Review of Systems  HENT:       Mouth lesions     Observations/Objective: No SOB or distress noted   Assessment and Plan: 1. Oral thrush Avoid spicy, citrus foods Rinse mouth after meals Call if symptoms worsen or do not improve   - nystatin (MYCOSTATIN) 100000 UNIT/ML suspension; Take 5 mLs (500,000 Units total) by mouth 4 (four) times daily.  Dispense: 473 mL; Refill: 1 - clotrimazole (MYCELEX) 10 MG  troche; Take 1 tablet (10 mg total) by mouth 5 (five) times daily.  Dispense: 90 Troche; Refill: 0     I discussed the assessment and treatment plan with the patient. The patient was provided an opportunity to ask questions and all were answered. The patient agreed with the plan and demonstrated an understanding of the instructions.   The patient was advised to call back or seek an in-person evaluation if the symptoms worsen or if the condition fails to improve as anticipated.  The above assessment and management plan was discussed with the patient. The patient verbalized understanding of and has agreed to the management plan. Patient is aware to call the clinic if symptoms persist or worsen. Patient is aware when to return to the clinic for a follow-up visit. Patient educated on when it is appropriate to go to the emergency department.   Time call ended:  11:09 AM  I provided 9 minutes of non-face-to-face time during this encounter.    Evelina Dun, FNP

## 2019-08-08 ENCOUNTER — Telehealth: Payer: Self-pay | Admitting: Family

## 2019-08-08 DIAGNOSIS — Z992 Dependence on renal dialysis: Secondary | ICD-10-CM | POA: Diagnosis not present

## 2019-08-08 DIAGNOSIS — N186 End stage renal disease: Secondary | ICD-10-CM | POA: Diagnosis not present

## 2019-08-08 MED ORDER — FLUCONAZOLE 150 MG PO TABS
150.0000 mg | ORAL_TABLET | ORAL | 0 refills | Status: DC | PRN
Start: 2019-08-08 — End: 2019-11-11

## 2019-08-08 NOTE — Telephone Encounter (Signed)
Patient aware, script is ready. 

## 2019-08-08 NOTE — Telephone Encounter (Signed)
Diflucan Prescription sent to pharmacy   

## 2019-08-08 NOTE — Addendum Note (Signed)
Addended by: Evelina Dun A on: 08/08/2019 01:22 PM   Modules accepted: Orders

## 2019-08-08 NOTE — Telephone Encounter (Signed)
Requesting medicine for itching.

## 2019-08-08 NOTE — Telephone Encounter (Signed)
Pts daughter states mom is having vaginal itching and discharge and believes she has a yeast infection from medications. Wanting to see if something can be sent in to [facility] in [place].

## 2019-08-10 ENCOUNTER — Other Ambulatory Visit: Payer: Self-pay

## 2019-08-10 ENCOUNTER — Inpatient Hospital Stay (HOSPITAL_COMMUNITY): Payer: BC Managed Care – PPO

## 2019-08-10 ENCOUNTER — Inpatient Hospital Stay (HOSPITAL_COMMUNITY)
Admission: EM | Admit: 2019-08-10 | Discharge: 2019-08-10 | DRG: 291 | Disposition: A | Discharge status: Home / Self Care | Payer: BC Managed Care – PPO | Attending: Family Medicine | Admitting: Family Medicine

## 2019-08-10 ENCOUNTER — Emergency Department (HOSPITAL_COMMUNITY): Payer: BC Managed Care – PPO

## 2019-08-10 ENCOUNTER — Encounter (HOSPITAL_COMMUNITY): Payer: Self-pay

## 2019-08-10 DIAGNOSIS — I272 Pulmonary hypertension, unspecified: Secondary | ICD-10-CM | POA: Diagnosis present

## 2019-08-10 DIAGNOSIS — I081 Rheumatic disorders of both mitral and tricuspid valves: Secondary | ICD-10-CM | POA: Diagnosis present

## 2019-08-10 DIAGNOSIS — E785 Hyperlipidemia, unspecified: Secondary | ICD-10-CM | POA: Diagnosis not present

## 2019-08-10 DIAGNOSIS — Z20822 Contact with and (suspected) exposure to covid-19: Secondary | ICD-10-CM | POA: Diagnosis present

## 2019-08-10 DIAGNOSIS — I6529 Occlusion and stenosis of unspecified carotid artery: Secondary | ICD-10-CM | POA: Diagnosis not present

## 2019-08-10 DIAGNOSIS — I5043 Acute on chronic combined systolic (congestive) and diastolic (congestive) heart failure: Secondary | ICD-10-CM | POA: Diagnosis not present

## 2019-08-10 DIAGNOSIS — Z940 Kidney transplant status: Secondary | ICD-10-CM

## 2019-08-10 DIAGNOSIS — Z992 Dependence on renal dialysis: Secondary | ICD-10-CM | POA: Diagnosis not present

## 2019-08-10 DIAGNOSIS — Z841 Family history of disorders of kidney and ureter: Secondary | ICD-10-CM

## 2019-08-10 DIAGNOSIS — N186 End stage renal disease: Secondary | ICD-10-CM | POA: Diagnosis not present

## 2019-08-10 DIAGNOSIS — R509 Fever, unspecified: Secondary | ICD-10-CM | POA: Diagnosis not present

## 2019-08-10 DIAGNOSIS — J90 Pleural effusion, not elsewhere classified: Secondary | ICD-10-CM | POA: Diagnosis not present

## 2019-08-10 DIAGNOSIS — E1122 Type 2 diabetes mellitus with diabetic chronic kidney disease: Secondary | ICD-10-CM | POA: Diagnosis present

## 2019-08-10 DIAGNOSIS — E1165 Type 2 diabetes mellitus with hyperglycemia: Secondary | ICD-10-CM

## 2019-08-10 DIAGNOSIS — N2581 Secondary hyperparathyroidism of renal origin: Secondary | ICD-10-CM | POA: Diagnosis not present

## 2019-08-10 DIAGNOSIS — Z833 Family history of diabetes mellitus: Secondary | ICD-10-CM | POA: Diagnosis not present

## 2019-08-10 DIAGNOSIS — E1129 Type 2 diabetes mellitus with other diabetic kidney complication: Secondary | ICD-10-CM | POA: Diagnosis not present

## 2019-08-10 DIAGNOSIS — Z8249 Family history of ischemic heart disease and other diseases of the circulatory system: Secondary | ICD-10-CM

## 2019-08-10 DIAGNOSIS — I132 Hypertensive heart and chronic kidney disease with heart failure and with stage 5 chronic kidney disease, or end stage renal disease: Secondary | ICD-10-CM | POA: Diagnosis not present

## 2019-08-10 DIAGNOSIS — E11649 Type 2 diabetes mellitus with hypoglycemia without coma: Secondary | ICD-10-CM | POA: Diagnosis not present

## 2019-08-10 DIAGNOSIS — Z87441 Personal history of nephrotic syndrome: Secondary | ICD-10-CM

## 2019-08-10 DIAGNOSIS — E119 Type 2 diabetes mellitus without complications: Secondary | ICD-10-CM

## 2019-08-10 DIAGNOSIS — D631 Anemia in chronic kidney disease: Secondary | ICD-10-CM | POA: Diagnosis present

## 2019-08-10 DIAGNOSIS — J811 Chronic pulmonary edema: Secondary | ICD-10-CM | POA: Diagnosis present

## 2019-08-10 DIAGNOSIS — E8889 Other specified metabolic disorders: Secondary | ICD-10-CM | POA: Diagnosis present

## 2019-08-10 DIAGNOSIS — R0602 Shortness of breath: Secondary | ICD-10-CM | POA: Diagnosis not present

## 2019-08-10 DIAGNOSIS — J9601 Acute respiratory failure with hypoxia: Secondary | ICD-10-CM | POA: Diagnosis not present

## 2019-08-10 DIAGNOSIS — J810 Acute pulmonary edema: Secondary | ICD-10-CM | POA: Diagnosis not present

## 2019-08-10 DIAGNOSIS — R0600 Dyspnea, unspecified: Secondary | ICD-10-CM

## 2019-08-10 DIAGNOSIS — I2510 Atherosclerotic heart disease of native coronary artery without angina pectoris: Secondary | ICD-10-CM | POA: Diagnosis not present

## 2019-08-10 DIAGNOSIS — Z91048 Other nonmedicinal substance allergy status: Secondary | ICD-10-CM

## 2019-08-10 DIAGNOSIS — I509 Heart failure, unspecified: Secondary | ICD-10-CM | POA: Diagnosis not present

## 2019-08-10 DIAGNOSIS — R0689 Other abnormalities of breathing: Secondary | ICD-10-CM

## 2019-08-10 HISTORY — DX: Acute on chronic combined systolic (congestive) and diastolic (congestive) heart failure: I50.43

## 2019-08-10 HISTORY — DX: Chronic pulmonary edema: J81.1

## 2019-08-10 LAB — CBC WITH DIFFERENTIAL/PLATELET
Abs Immature Granulocytes: 0.05 10*3/uL (ref 0.00–0.07)
Basophils Absolute: 0 10*3/uL (ref 0.0–0.1)
Basophils Relative: 0 %
Eosinophils Absolute: 0.4 10*3/uL (ref 0.0–0.5)
Eosinophils Relative: 4 %
HCT: 32 % — ABNORMAL LOW (ref 36.0–46.0)
Hemoglobin: 9.4 g/dL — ABNORMAL LOW (ref 12.0–15.0)
Immature Granulocytes: 1 %
Lymphocytes Relative: 9 %
Lymphs Abs: 0.9 10*3/uL (ref 0.7–4.0)
MCH: 28.1 pg (ref 26.0–34.0)
MCHC: 29.4 g/dL — ABNORMAL LOW (ref 30.0–36.0)
MCV: 95.8 fL (ref 80.0–100.0)
Monocytes Absolute: 0.4 10*3/uL (ref 0.1–1.0)
Monocytes Relative: 4 %
Neutro Abs: 8.2 10*3/uL — ABNORMAL HIGH (ref 1.7–7.7)
Neutrophils Relative %: 82 %
Platelets: 224 10*3/uL (ref 150–400)
RBC: 3.34 MIL/uL — ABNORMAL LOW (ref 3.87–5.11)
RDW: 16.1 % — ABNORMAL HIGH (ref 11.5–15.5)
WBC: 10.1 10*3/uL (ref 4.0–10.5)
nRBC: 0 % (ref 0.0–0.2)

## 2019-08-10 LAB — TROPONIN I (HIGH SENSITIVITY)
Troponin I (High Sensitivity): 16 ng/L (ref <18)
Troponin I (High Sensitivity): 15 ng/L (ref <18)

## 2019-08-10 LAB — COMPREHENSIVE METABOLIC PANEL
ALT: 17 U/L (ref 0–44)
AST: 19 U/L (ref 15–41)
Albumin: 3.2 g/dL — ABNORMAL LOW (ref 3.5–5.0)
Alkaline Phosphatase: 78 U/L (ref 38–126)
Anion gap: 11 (ref 5–15)
BUN: 31 mg/dL — ABNORMAL HIGH (ref 8–23)
CO2: 29 mmol/L (ref 22–32)
Calcium: 8.8 mg/dL — ABNORMAL LOW (ref 8.9–10.3)
Chloride: 98 mmol/L (ref 98–111)
Creatinine, Ser: 6.26 mg/dL — ABNORMAL HIGH (ref 0.44–1.00)
GFR calc Af Amer: 8 mL/min — ABNORMAL LOW (ref >60)
GFR calc non Af Amer: 6 mL/min — ABNORMAL LOW (ref >60)
Glucose, Bld: 141 mg/dL — ABNORMAL HIGH (ref 70–99)
Potassium: 4 mmol/L (ref 3.5–5.1)
Sodium: 138 mmol/L (ref 135–145)
Total Bilirubin: 0.7 mg/dL (ref 0.3–1.2)
Total Protein: 7 g/dL (ref 6.5–8.1)

## 2019-08-10 LAB — BRAIN NATRIURETIC PEPTIDE: B Natriuretic Peptide: >4500 pg/mL — ABNORMAL HIGH (ref 0.0–100.0)

## 2019-08-10 LAB — HEPATITIS B SURFACE ANTIGEN: Hepatitis B Surface Ag: NONREACTIVE

## 2019-08-10 LAB — CBG MONITORING, ED
Glucose-Capillary: 69 mg/dL — ABNORMAL LOW (ref 70–99)
Glucose-Capillary: 172 mg/dL — ABNORMAL HIGH (ref 70–99)

## 2019-08-10 LAB — SARS CORONAVIRUS 2 BY RT PCR (HOSPITAL ORDER, PERFORMED IN ~~LOC~~ HOSPITAL LAB): SARS Coronavirus 2: NEGATIVE

## 2019-08-10 MED ORDER — INSULIN ASPART 100 UNIT/ML ~~LOC~~ SOLN: 0.0000 [IU] | Freq: Three times a day (TID) | SUBCUTANEOUS | Status: DC

## 2019-08-10 MED ORDER — INSULIN ASPART 100 UNIT/ML ~~LOC~~ SOLN: 0.0000 [IU] | Freq: Every day | SUBCUTANEOUS | Status: DC

## 2019-08-10 MED ORDER — ONDANSETRON HCL 4 MG/2ML IJ SOLN
4.0000 mg | Freq: Once | INTRAMUSCULAR | Status: AC
  Administered 2019-08-10: 4 mg via INTRAVENOUS
  Filled 2019-08-10: qty 2

## 2019-08-10 MED ORDER — LIDOCAINE-PRILOCAINE 2.5-2.5 % EX CREA: 1.0000 "application " | TOPICAL_CREAM | CUTANEOUS | Status: DC | PRN

## 2019-08-10 MED ORDER — SODIUM CHLORIDE 0.9 % IV SOLN: 100.0000 mL | INTRAVENOUS | Status: DC | PRN

## 2019-08-10 MED ORDER — LIDOCAINE HCL (PF) 1 % IJ SOLN: 5.0000 mL | INTRAMUSCULAR | Status: DC | PRN

## 2019-08-10 MED ORDER — INSULIN ASPART 100 UNIT/ML ~~LOC~~ SOLN: 2.0000 [IU] | Freq: Three times a day (TID) | SUBCUTANEOUS | Status: DC

## 2019-08-10 MED ORDER — CHLORHEXIDINE GLUCONATE CLOTH 2 % EX PADS: 6.0000 | MEDICATED_PAD | Freq: Every day | CUTANEOUS | Status: DC

## 2019-08-10 MED ORDER — SEVELAMER CARBONATE 800 MG PO TABS
2400.0000 mg | ORAL_TABLET | Freq: Three times a day (TID) | ORAL | Status: DC
  Filled 2019-08-10 (×3): qty 3

## 2019-08-10 MED ORDER — PENTAFLUOROPROP-TETRAFLUOROETH EX AERO: 1.0000 "application " | INHALATION_SPRAY | CUTANEOUS | Status: DC | PRN

## 2019-08-10 MED ORDER — DARBEPOETIN ALFA 60 MCG/0.3ML IJ SOSY
60.0000 ug | PREFILLED_SYRINGE | INTRAMUSCULAR | Status: DC
  Filled 2019-08-10: qty 0.3

## 2019-08-10 NOTE — ED Provider Notes (Signed)
Emergency Department Provider Note   I have reviewed the triage vital signs and the nursing notes.   HISTORY  Chief Complaint Shortness of Breath   HPI [name] is a 63 y.o. female with past medical history of diabetes, end-stage renal disease (MWF), HTN, and CHF (EF 40-45%) presents emergency department for evaluation of shortness of breath starting this morning.  Patient was feeling well prior to going to bed last night when she awoke feeling winded.  She is having some inspiratory chest pressure in the center of her chest that is nonradiating.  She has a mild cough.  She denies any wheezing.  She does not use inhalers at home.  She was last dialyzed on Friday.  She denies any fevers or shaking chills.  She has been fully vaccinated for COVID-19.  She is not having runny nose or sore throat.  No diarrhea or vomiting.  Denies abdominal or flank pain.   Past Medical History:  Diagnosis Date  . Anemia    of chronic disease  . Blood transfusion without reported diagnosis   . Cardiovascular disease    nonobstructive  . Carotid artery stenosis 2008  . CHF (congestive heart failure) ([place])   . Coronary artery disease   . Diabetes mellitus   . Dyslipidemia   . Edema of lower extremity    with hypo-albuminemia and profound protenuria  . Heart murmur   . Hypertension   . Mitral regurgitation   . Nephrotic syndrome   . Patent foramen ovale   . Pulmonary hypertension, moderate to severe ([place])   . Pulmonary nodule   . Tricuspid regurgitation   . Volume depletion, renal, due to output loss (renal deficit)     Patient Active Problem List   Diagnosis Date Noted  . Pulmonary edema 08/10/2019  . Acute on chronic combined systolic and diastolic CHF (congestive heart failure) -EF 40 to 45 % 08/10/2019  . Pneumonia 07/20/2019  . Nausea with vomiting, unspecified 05/25/2019  . Abnormal CXR (chest x-ray) 05/25/2019  . Acute diastolic CHF (congestive heart failure) ([place]) 12/27/2018   . Acute hypoxemic respiratory failure ([place]) 12/16/2018  . Dyspnea 12/15/2018  . Lung collapse 12/15/2018  . HCAP (healthcare-associated pneumonia)   . Lobar pneumonia ([place]) 12/02/2018  . Acute respiratory failure with hypoxia ([place]) 12/02/2018  . Hydronephrosis   . Abnormal LFTs (liver function tests) 03/11/2018  . Type 2 diabetes mellitus ([employer]) 03/11/2018  . Valvular heart disease 03/11/2018  . Chronic kidney disease on chronic dialysis ([place]) 11/13/2017  . Leukocytosis 09/21/2017  . Generalized weakness 09/21/2017  . Hyperlipidemia associated with type 2 diabetes mellitus ([place]) 02/22/2017  . ESRD (end stage renal disease) ([place]) Aug 15, 2016  . 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 Encounter for aftercare following kidney transplant 05/22/2016  . Chronic diastolic heart failure ([place]) 06/08/2015  . Aortic valve sclerosis 06/08/2015  . Anemia of chronic disease 07/03/2013  . Essential hypertension 12/18/2010    Past Surgical History:  Procedure Laterality Date  . A/V FISTULAGRAM N/A 04/05/2017   Procedure: A/V FISTULAGRAM - Left Arm;  Surgeon: Angelia Mould, MD;  Location: [facility];  Service: Cardiovascular;  Laterality: N/A;  . A/V FISTULAGRAM Left 09/18/2017   Procedure: A/V FISTULAGRAM;  Surgeon: Serafina Mitchell, MD;  Location: [facility];  Service: Cardiovascular;  Laterality: Left;  . A/V SHUNT INTERVENTION Left 04/05/2017   Procedure: A/V SHUNT INTERVENTION;  Surgeon: Angelia Mould, MD;  Location: [facility];  Service: Cardiovascular;  Laterality: Left;  . CARDIAC CATHETERIZATION  2008  . IR REMOVAL TUN CV CATH W/O FL  11/02/2016  . 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 PERIPHERAL VASCULAR BALLOON ANGIOPLASTY Left 09/18/2017   Procedure: PERIPHERAL VASCULAR BALLOON ANGIOPLASTY;  Surgeon: Serafina Mitchell, MD;  Location: [facility];  Service: Cardiovascular;  Laterality: Left;  Arm Fistula    Allergies Other  and Tape  Family History  Problem Relation Age of Onset  . Kidney disease Mother   . Diabetes Mother   . Diabetes Father   . Heart disease Father     Social History Social History   Tobacco Use  . Smoking status: Never Smoker  . Smokeless tobacco: Never Used  Substance Use Topics  . Alcohol use: No  . Drug use: No    Review of Systems  Constitutional: No fever/chills Eyes: No visual changes. ENT: No sore throat. Cardiovascular: Positive chest pain. Respiratory: Positive shortness of breath. Gastrointestinal: No abdominal pain.  No nausea, no vomiting.  No diarrhea.  No constipation. Genitourinary: Negative for dysuria. Musculoskeletal: Negative for back pain. Skin: Negative for rash. Neurological: Negative for headaches, focal weakness or numbness.  10-point ROS otherwise negative.  ____________________________________________   PHYSICAL EXAM:  VITAL SIGNS: Vitals:   08/10/19 1715 08/10/19 1929  BP: (!) 109/52 (!) 112/58  Pulse:  65  Resp:  16  Temp:    SpO2:  95%    Constitutional: Alert and oriented. Well appearing and in no acute distress. Eyes: Conjunctivae are normal.  Head: Atraumatic. Nose: No congestion/rhinnorhea. Mouth/Throat: Mucous membranes are moist.  Neck: No stridor.   Cardiovascular: Normal rate, regular rhythm. Good peripheral circulation. Grossly normal heart sounds.   Respiratory: Increased respiratory effort.  No retractions. Lungs with occasional crackles at the bases. No wheezing.  Gastrointestinal: Soft and nontender. No distention.  Musculoskeletal: No gross deformities of extremities. Neurologic:  Normal speech and language.  Skin:  Skin is warm, dry and intact. No rash noted.  ____________________________________________   LABS (all labs ordered are listed, but only abnormal results are displayed)  Labs Reviewed  COMPREHENSIVE METABOLIC PANEL - Abnormal; Notable for the following components:      Result Value    Glucose, Bld 141 (*)    BUN 31 (*)    Creatinine, Ser 6.26 (*)    Calcium 8.8 (*)    Albumin 3.2 (*)    GFR calc non Af Amer 6 (*)    GFR calc Af Amer 8 (*)    All other components within normal limits  BRAIN NATRIURETIC PEPTIDE - Abnormal; Notable for the following components:   B Natriuretic Peptide >4,500.0 (*)    All other components within normal limits  CBC WITH DIFFERENTIAL/PLATELET - Abnormal; Notable for the following components:   RBC 3.34 (*)    Hemoglobin 9.4 (*)    HCT 32.0 (*)    MCHC 29.4 (*)    RDW 16.1 (*)    Neutro Abs 8.2 (*)    All other components within normal limits  CBG MONITORING, ED - Abnormal; Notable for the following components:   Glucose-Capillary 69 (*)    All other components within normal limits  CBG MONITORING, ED - Abnormal; Notable for the following components:   Glucose-Capillary 172 (*)    All other components within normal limits  SARS CORONAVIRUS 2 BY RT PCR (HOSPITAL ORDER, [facility])  HEPATITIS B SURFACE ANTIGEN  TROPONIN I (HIGH SENSITIVITY)  TROPONIN  I (HIGH SENSITIVITY)   ____________________________________________  EKG   EKG Interpretation  Date/Time:  Sunday August 10 2019 07:56:10 EDT Ventricular Rate:  61 PR Interval:    QRS Duration: 107 QT Interval:  479 QTC Calculation: 483 R Axis:   -25 Text Interpretation: Sinus rhythm Atrial premature complex Probable left ventricular hypertrophy Borderline T abnormalities, inferior leads Similar to May 17th tracing No STEMI Confirmed by Nanda Quinton [phone]) on 08/10/2019 8:02:37 AM       ____________________________________________  RADIOLOGY  No results found.  ____________________________________________   PROCEDURES  Procedure(s) performed:   Procedures  CRITICAL CARE Performed by: Margette Fast Total critical care time: 50 minutes Critical care time was exclusive of separately billable procedures and treating other patients. Critical  care was necessary to treat or prevent imminent or life-threatening deterioration. Critical care was time spent personally by me on the following activities: development of treatment plan with patient and/or surrogate as well as nursing, discussions with consultants, evaluation of patient's response to treatment, examination of patient, obtaining history from patient or surrogate, ordering and performing treatments and interventions, ordering and review of laboratory studies, ordering and review of radiographic studies, pulse oximetry and re-evaluation of patient's condition.  Nanda Quinton, MD Emergency Medicine  ____________________________________________   INITIAL IMPRESSION / ASSESSMENT AND PLAN / ED COURSE  Pertinent labs & imaging results that were available during my care of the patient were reviewed by me and considered in my medical decision making (see chart for details).   Patient presents emergency department for evaluation of shortness of breath starting this morning.  There is a component of inspiratory chest pressure but my suspicion for PE is lower.  Patient has been on dialysis for several years after her transplant and does not make urine.  Last dialyzed Friday.  Overall well-appearing with some mild increased work of breathing.  Plan for portable chest x-ray, Covid testing, labs, EKG, reassess.   09:10 AM   patient's lab work is coming back with no acute findings.  Potassium is 4.0.  Patient continues to have some increased work of breathing and now requiring 4 L nasal cannula oxygen.  I reassessed and she has not appear clinically much worse but now has a new oxygen requirement.  Will page out and discussed with nephrology regarding urgent hemodialysis.   09:32 AM  Patient placed on BiPAP and tolerating it well.  She reports some subjective improvement initially.  Oxygen saturation is holding.  Discussed the case with Dr. Jonnie Finner on for Nephrology. He will coordinate urgent HD  here at [facility]. Will discuss with [facility].   09:44 AM  Patient reassessed and continues to look improved on BiPAP.  She feels like she can breathe better.  Dr. Carolin Sicks is to coordinate dialysis here at [facility] today after discussion with Dr. Jonnie Finner.   Discussed patient's case with [facility] to request admission. Patient and family (if present) updated with plan. Care transferred to [facility] service.  I reviewed all nursing notes, vitals, pertinent old records, EKGs, labs, imaging (as available).  ____________________________________________  FINAL CLINICAL IMPRESSION(S) / ED DIAGNOSES  Final diagnoses:  Acute respiratory failure with hypoxia ([place])  Acute pulmonary edema ([facility])     MEDICATIONS GIVEN DURING THIS VISIT:  Medications  ondansetron (ZOFRAN) injection 4 mg (4 mg Intravenous Given 08/10/19 0920)     Note:  This document was prepared using Dragon voice recognition software and may include unintentional dictation errors.  Nanda Quinton, MD, [facility] Emergency Medicine  Margette Fast, MD 08/11/19 1314

## 2019-08-10 NOTE — ED Notes (Signed)
Sats dropped to 87% on RA, pt SOB at rest.  Placedbpt on O2 @ 4Lm, Sats increased to 95%.  Dr long informed.

## 2019-08-10 NOTE — Progress Notes (Signed)
Nephrology note: 64 year old female ESRD on HD admitted with shortness of breath/hypoxia, pulmonary edema.  Currently on BiPAP. Plan for dialysis today, UF as tolerated. Discussed with dialysis nurse. Full consult to follow.  Katheran James, MD [facility]

## 2019-08-10 NOTE — ED Notes (Signed)
Pt taken off BiPAP and placed on O2@3L /M via n/c by RT.  Tolerating well. Sats 100%.  No distress noted.

## 2019-08-10 NOTE — ED Notes (Signed)
Provided patient with dinner tray. Family at bedside

## 2019-08-10 NOTE — ED Notes (Signed)
Resp in to adjust Bi-PAP 14/7 @ 35%.

## 2019-08-10 NOTE — ED Notes (Signed)
Spoke to [name] in parking lot.  Informed of pts changes.

## 2019-08-10 NOTE — ED Notes (Signed)
Pt continues to work to breathe,  Dr Laverta Baltimore in to assess pt and inform of plan.

## 2019-08-10 NOTE — ED Notes (Signed)
Levada Dy, RN from HD on for pts treatment.

## 2019-08-10 NOTE — Discharge Instructions (Signed)
discharge instructions as below:- -1)Very low-salt diet advised- Less than 2gm sodium per day 2)Weigh yourself daily, call if you gain more than 3 pounds in 1 day or more than 5 pounds in 1 week as your hemodialysis dry weight may need to be adjusted 3)Limit your Fluid  intake to no more than 50 ounces (1.5 Liters) per day 4) please go for your hemodialysis on Monday, 08/11/2019 at 7 AM in [place] per your usual schedule 5) please follow-up with your nephrologist and PCP for further adjustments of medications and hemodialysis dry weight

## 2019-08-10 NOTE — ED Notes (Signed)
Tolerating Bi-PAP and HD well.  Pt asleep.  VSS.

## 2019-08-10 NOTE — ED Notes (Signed)
Pt transferred to room 2 and placed on Bi-PAP, Dr Laverta Baltimore at bedside and husband updated by EDP.

## 2019-08-10 NOTE — Consult Note (Signed)
[facility] Nephrology consult note Reason for Consult: To manage dialysis and dialysis related needs Referring Physician: Dr. [name], C HPI:  [name] is an 64 y.o. female with history of HTN, CHF, pulmonary hypertension, CAD, DM, ESRD status post failed kidney transplant now on hemodialysis MWF at [facility] presented with sudden onset of shortness of breath associated with hypoxia started this morning, seen as a consultation for the management of ESRD and fluid overload. Patient was recently admitted in 07/2019 for similar presentation with shortness of breath and the chest x-ray was possible pneumonia versus pulmonary edema. She gets dialysis MWF, last treatment was reportedly was on Friday for 4 hours, not much UF per patient.  Patient reported that she did not miss any dialysis treatment.  This morning she woke up with shortness of breath associated with a cough.  She denies fever however reports chills. In the ED, she was hypoxic to 87% in room air requiring BiPAP.  Blood pressure was elevated to 172/75.  The chest x-ray showed interstitial edema and some airspace disease.  The lab consistent with potassium level 4, BUN 31, hemoglobin 9.4, WBC 10.1. There was concern of pulmonary edema, fluid overload therefore we are consulted to do dialysis to manage volume. She is currently in ER using BiPAP.  Some shortness of breath.  Her husband at bedside.  Denies chest pain, nausea, vomiting, fever.  Reportedly some chills and dry cough.    Dialysis Orders: Center[facility] MWF. EDW68kgHD Bath 2K/2.5CaTime 4 hoursHeparin none. AccessLAVFBFR 300DFR 600  16 gauge needles, EPO 6,000 IV tiw, hectoral 3.5 mcg IVP tiw, venofer 50 mg IV once a week  Past Medical History:  Diagnosis Date  . Anemia    of chronic disease  . Blood transfusion without reported diagnosis   . Cardiovascular disease    nonobstructive  . Carotid artery stenosis 2008  . CHF (congestive  heart failure) ([place])   . Coronary artery disease   . Diabetes mellitus   . Dyslipidemia   . Edema of lower extremity    with hypo-albuminemia and profound protenuria  . Heart murmur   . Hypertension   . Mitral regurgitation   . Nephrotic syndrome   . Patent foramen ovale   . Pulmonary hypertension, moderate to severe ([place])   . Pulmonary nodule   . Tricuspid regurgitation   . Volume depletion, renal, due to output loss (renal deficit)     Past Surgical History:  Procedure Laterality Date  . A/V FISTULAGRAM N/A 04/05/2017   Procedure: A/V FISTULAGRAM - Left Arm;  Surgeon: Angelia Mould, MD;  Location: [facility];  Service: Cardiovascular;  Laterality: N/A;  . A/V FISTULAGRAM Left 09/18/2017   Procedure: A/V FISTULAGRAM;  Surgeon: Serafina Mitchell, MD;  Location: [place] [facility];  Service: Cardiovascular;  Laterality: Left;  . A/V SHUNT INTERVENTION Left 04/05/2017   Procedure: A/V SHUNT INTERVENTION;  Surgeon: Angelia Mould, MD;  Location: [facility];  Service: Cardiovascular;  Laterality: Left;  . CARDIAC CATHETERIZATION  2008  . IR REMOVAL TUN CV CATH W/O FL  11/02/2016  . 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 PERIPHERAL VASCULAR BALLOON ANGIOPLASTY Left 09/18/2017   Procedure: PERIPHERAL VASCULAR BALLOON ANGIOPLASTY;  Surgeon: Serafina Mitchell, MD;  Location: [place] [facility];  Service: Cardiovascular;  Laterality: Left;  Arm Fistula    Family History  Problem Relation Age of Onset  . Kidney disease Mother   . Diabetes Mother   .  Diabetes Father   . Heart disease Father     Social History:  reports that she has never smoked. She has never used smokeless tobacco. She reports that she does not drink alcohol or use drugs.  Allergies:  Allergies  Allergen Reactions  . Other Other (See Comments)    Tape Bruises and tears skin, Paper tape tolerated  . Tape Other (See Comments)    Bruises and tears skin Paper tape tolerated Bruises and  tears skin Paper tape tolerated Bruises and tears skin Paper tape tolerated    Medications: I have reviewed the patient's current medications.   Results for orders placed or performed during the hospital encounter of 08/10/19 (from the past 48 hour(s))  SARS Coronavirus 2 by RT PCR (hospital order, performed in [facility] hospital lab) Nasopharyngeal Nasopharyngeal Swab     Status: None   Collection Time: 08/10/19  7:42 AM   Specimen: Nasopharyngeal Swab  Result Value Ref Range   SARS Coronavirus 2 NEGATIVE NEGATIVE    Comment: (NOTE) SARS-CoV-2 target nucleic acids are NOT DETECTED. The SARS-CoV-2 RNA is generally detectable in upper and lower respiratory specimens during the acute phase of infection. The lowest concentration of SARS-CoV-2 viral copies this assay can detect is 250 copies / mL. A negative result does not preclude SARS-CoV-2 infection and should not be used as the sole basis for treatment or other patient management decisions.  A negative result may occur with improper specimen collection / handling, submission of specimen other than nasopharyngeal swab, presence of viral mutation(s) within the areas targeted by this assay, and inadequate number of viral copies (<250 copies / mL). A negative result must be combined with clinical observations, patient history, and epidemiological information. Fact Sheet for Patients:   StrictlyIdeas.no Fact Sheet for Healthcare Providers: BankingDealers.co.za This test is not yet approved or cleared  by the [place] FDA and has been authorized for detection and/or diagnosis of SARS-CoV-2 by FDA under an Emergency Use Authorization (EUA).  This EUA will remain in effect (meaning this test can be used) for the duration of the COVID-19 declaration under Section 564(b)(1) of the Act, 21 U.S.C. section 360bbb-3(b)(1), unless the authorization is terminated or revoked  sooner. Performed at [facility], [address] [place], [place] [address]   Comprehensive metabolic panel     Status: Abnormal   Collection Time: 08/10/19  8:02 AM  Result Value Ref Range   Sodium 138 135 - 145 mmol/L   Potassium 4.0 3.5 - 5.1 mmol/L   Chloride 98 98 - 111 mmol/L   CO2 29 22 - 32 mmol/L   Glucose, Bld 141 (H) 70 - 99 mg/dL    Comment: Glucose reference range applies only to samples taken after fasting for at least 8 hours.   BUN 31 (H) 8 - 23 mg/dL   Creatinine, Ser 6.26 (H) 0.44 - 1.00 mg/dL   Calcium 8.8 (L) 8.9 - 10.3 mg/dL   Total Protein 7.0 6.5 - 8.1 g/dL   Albumin 3.2 (L) 3.5 - 5.0 g/dL   AST 19 15 - 41 U/L   ALT 17 0 - 44 U/L   Alkaline Phosphatase 78 38 - 126 U/L   Total Bilirubin 0.7 0.3 - 1.2 mg/dL   GFR calc non Af Amer 6 (L) >60 mL/min   GFR calc Af Amer 8 (L) >60 mL/min   Anion gap 11 5 - 15    Comment: Performed at [facility], [address], [place], [place] [address]  Brain natriuretic peptide     Status: Abnormal   Collection Time: 08/10/19  8:02 AM  Result Value Ref Range   B Natriuretic Peptide >4,500.0 (H) 0.0 - 100.0 pg/mL    Comment: Performed at [facility], [address] [place], [place] [address]  Troponin I (High Sensitivity)     Status: None   Collection Time: 08/10/19  8:02 AM  Result Value Ref Range   Troponin I (High Sensitivity) 16 <18 ng/L    Comment: (NOTE) Elevated high sensitivity troponin I (hsTnI) values and significant  changes across serial measurements may suggest ACS but many other  chronic and acute conditions are known to elevate hsTnI results.  Refer to the "Links" section for chest pain algorithms and additional  guidance. Performed at [facility], [address] [place], [place] [address]   CBC with Differential     Status: Abnormal   Collection Time: 08/10/19  8:02 AM  Result Value Ref Range   WBC 10.1 4.0 - 10.5 K/uL   RBC 3.34 (L) 3.87 - 5.11 MIL/uL   Hemoglobin 9.4 (L) 12.0 - 15.0 g/dL   HCT  32.0 (L) 36.0 - 46.0 %   MCV 95.8 80.0 - 100.0 fL   MCH 28.1 26.0 - 34.0 pg   MCHC 29.4 (L) 30.0 - 36.0 g/dL   RDW 16.1 (H) 11.5 - 15.5 %   Platelets 224 150 - 400 K/uL   nRBC 0.0 0.0 - 0.2 %   Neutrophils Relative % 82 %   Neutro Abs 8.2 (H) 1.7 - 7.7 K/uL   Lymphocytes Relative 9 %   Lymphs Abs 0.9 0.7 - 4.0 K/uL   Monocytes Relative 4 %   Monocytes Absolute 0.4 0.1 - 1.0 K/uL   Eosinophils Relative 4 %   Eosinophils Absolute 0.4 0.0 - 0.5 K/uL   Basophils Relative 0 %   Basophils Absolute 0.0 0.0 - 0.1 K/uL   Immature Granulocytes 1 %   Abs Immature Granulocytes 0.05 0.00 - 0.07 K/uL    Comment: Performed at [facility], [address], [place], [place] [address]  Troponin I (High Sensitivity)     Status: None   Collection Time: 08/10/19 10:11 AM  Result Value Ref Range   Troponin I (High Sensitivity) 15 <18 ng/L    Comment: (NOTE) Elevated high sensitivity troponin I (hsTnI) values and significant  changes across serial measurements may suggest ACS but many other  chronic and acute conditions are known to elevate hsTnI results.  Refer to the "Links" section for chest pain algorithms and additional  guidance. Performed at [facility], [address] [place], [name] [address]     DG Chest Portable 1 View  Result Date: 08/10/2019 CLINICAL DATA:  Pt reports woke up sob, slight nonproductive cough, and chest pain with inspiration. Denies fever. Denies any sick contacts. Reports has had 2 covid vaccines. Reports is a dialysis pt and is due for dialysis tomorrow. Has not missed any treatments. Recently finished antibiotic for strep throat and was given something for a yeast infection. Pt denies any on body medical injector devices or spinal stimulators, Pt has pending COVID test. EXAM: PORTABLE CHEST 1 VIEW COMPARISON:  07/20/2019 and older studies. FINDINGS: Cardiac silhouette is normal in size. No mediastinal or hilar masses. There is interstitial thickening and central vascular  congestion, similar to the most recent prior study, but representing a change from a chest radiograph dated 05/26/2019. Subtle airspace opacity in the perihilar left lung. No convincing pleural  effusion and no pneumothorax. Skeletal structures are grossly intact. IMPRESSION: 1. Similar appearance to the most recent prior exam with bilateral interstitial thickening. Suspect interstitial edema. A component of subtle airspace edema is suggested in the left perihilar region. Electronically Signed   By: Lajean Manes M.D.   On: 08/10/2019 08:31    ROS: As per H&P.  Other systems reviewed and are negative. Blood pressure (!) 129/52, pulse 61, temperature 98.4 F (36.9 C), temperature source Oral, resp. rate 14, height 5\' 9"  (1.753 m), weight 68 kg, SpO2 100 %. General: Lying in bed, mild respiratory distress, using BiPAP. HEENT: PERRLA, [place]/AT  Respiratory: Coarse breath sound bilateral, no wheezing Cardiovascular: Regular rate rhythm, S1-S2 normal, no rub GI: Abdomen soft, nontender.  Bowel sounds positive Extremities: Trace nonpitting edema, no cyanosis Neurology: Alert, awake, following commands Skin: No ulcer or rash Dialysis Access: Left upper extremity AV fistula has good thrill and bruit.  Assessment/Plan: 1 Shortness of breath, acute hypoxic respiratory failure: Plan for HD today for ultrafiltration.  UF as tolerated.  Currently on BiPAP.  Per primary.  2 ESRD: MWF.  Receiving extra treatment today to manage volume.  Plan for another dialysis tomorrow to resume her schedule.  She has AV fistula for the access.  3 Hypertension: BP elevated with fluid overload.  Blood pressure expect to improve with UF.  Resume home medication.  4. Anemia of ESRD: Receives EPO and iron as outpatient.  Resume during HD.  5. Metabolic Bone Disease: Check phosphorus level.  Continue Renvela.  Thank you for the consult.  We will follow.  Elenie Coven Tanna Furry 08/10/2019, 12:31 PM

## 2019-08-10 NOTE — ED Notes (Signed)
Husband at bedside.  Pt asleep no resp distress noted.  Sats 100%.

## 2019-08-10 NOTE — ED Notes (Signed)
[name] from HD started dialysis at 12:25.  Pt asleep and tolerating well.  Husband at bedside.

## 2019-08-10 NOTE — Consult Note (Signed)
Patient Demographics:    [name], is a 64 y.o. female  MRN: [id number]   DOB - 1956-02-10  Admit Date - 08/10/2019  Outpatient Primary MD for the patient is Sharion Balloon, FNP   Assessment & Plan:    Principal Problem:   Acute on chronic combined systolic and diastolic CHF (congestive heart failure) -EF 40 to 45 % Active Problems:   ESRD (end stage renal disease) (HCC)   Pulmonary edema   Acute respiratory failure with hypoxia (HCC)   Type 2 diabetes mellitus (HCC)   A/p  1) acute hypoxic respiratory failure secondary to volume overload/acute on chronic combined systolic and diastolic dysfunction CHF exacerbation-- --initially required supplemental oxygen and BiPAP -Patient had hemodialysis in the ED with removal of 2 L of fluid -Postdialysis standing weight is 68.5 kg -Which is below patient's baseline dry weight of 69 kg -Hypoxia resolved completely patient ambulating without dyspnea on exertion no hypoxia post hemodialysis -Patient and husband requesting discharge home -Patient has hemodialysis tomorrow in ED and at 7 AM per his usual schedule -Discharge home directly from the ED with discharge instructions as below:- -1)Very low-salt diet advised- Less than 2gm sodium per day 2)Weigh yourself daily, call if you gain more than 3 pounds in 1 day or more than 5 pounds in 1 week as your hemodialysis dry weight may need to be adjusted 3)Limit your Fluid  intake to no more than 50 ounces (1.5 Liters) per day 4) please go for your hemodialysis on Monday, 08/11/2019 at 7 AM in [place] per your usual schedule 5) please follow-up with your nephrologist and PCP for further adjustments of medications and hemodialysis dry weight  2)-end-stage renal disease --Nephrology consult for hemodialysis appreciated  management as above #1  3)essential hypertension -A stable and well-controlled -Continue current antihypertensive regimen.  4)-type 2 diabetes with nephropathy =-Episode of hypoglycemia in the ED due to prolonged starvation, -May resume home regimen once oral intake resumes. 5)secondary hyperparathyroidism -Continue the use of Renvela  5)-prior history of kidney transplant -Requiring dialysis at this point -Continue the use of Prograf and home dose prednisone  6)-anemia of chronic kidney disease -Continue the use of IV iron and ESA per nephrology discretion.  Procedures: -Hemodialysis session on 08/10/2019 in the ED with removal of 2 L of fluid  Consultations: --Nephrologist Dr. Carolin Sicks  Disposition--- discharge home from the ED with instructions as above  With History of - Reviewed by me  Past Medical History:  Diagnosis Date   Anemia    of chronic disease   Blood transfusion without reported diagnosis    Cardiovascular disease    nonobstructive   Carotid artery stenosis 2008   CHF (congestive heart failure) ([place])    Coronary artery disease    Diabetes mellitus    Dyslipidemia    Edema of lower extremity    with hypo-albuminemia and profound protenuria   Heart murmur    Hypertension  Mitral regurgitation    Nephrotic syndrome    Patent foramen ovale    Pulmonary hypertension, moderate to severe (HCC)    Pulmonary nodule    Tricuspid regurgitation    Volume depletion, renal, due to output loss (renal deficit)       Past Surgical History:  Procedure Laterality Date   A/V FISTULAGRAM N/A 04/05/2017   Procedure: A/V FISTULAGRAM - Left Arm;  Surgeon: Angelia Mould, MD;  Location: [facility];  Service: Cardiovascular;  Laterality: N/A;   A/V FISTULAGRAM Left 09/18/2017   Procedure: A/V FISTULAGRAM;  Surgeon: Serafina Mitchell, MD;  Location: [facility];  Service: Cardiovascular;  Laterality: Left;   A/V SHUNT  INTERVENTION Left 04/05/2017   Procedure: A/V SHUNT INTERVENTION;  Surgeon: Angelia Mould, MD;  Location: [facility];  Service: Cardiovascular;  Laterality: Left;   CARDIAC CATHETERIZATION  2008   IR REMOVAL TUN CV CATH W/O FL  11/02/2016   KIDNEY TRANSPLANT Right 02/23/2009   PERIPHERAL VASCULAR BALLOON ANGIOPLASTY Left 09/18/2017   Procedure: PERIPHERAL VASCULAR BALLOON ANGIOPLASTY;  Surgeon: Serafina Mitchell, MD;  Location: [facility];  Service: Cardiovascular;  Laterality: Left;  Arm Fistula    Chief Complaint  Patient presents with   Shortness of Breath      HPI:    [name]  is a 64 y.o. female  with medical history significant forESRD, diabetes mellitus, coronary artery disease, history of combined chronic diastolic and systolic dysfunction CHF, ESRD with status post failed prior kidney transplant now on hemodialysis Monday Wednesday and Friday at [facility] , last HD session on Friday, 08/08/2019 with a discharge weight of 69.6 kg -Apparently had dry weight was recently increased from 68 kg to 69 kg -Denies fever chills chest pains palpitations or significant salt intake -  -Husband at bedside, additional history obtained -Patient endorses mild nonproductive cough, -Became acutely short of breath today, -Again no missed HD sessions -She did recently complete antibiotic therapy for strep throat and also was given antifungal for yeast infection recently - No Nausea, Vomiting or Diarrhea -In the ED she is found to be hypoxic with increased work of breathing initially required BiPAP due to increased work of breathing and hypoxia--was later transitioned to nasal cannula, please note that at baseline patient does not use O2  -In ED chest x-ray suggest interstitial edema with a BNP over 4500 -Troponin15, -WBC is 10.1, hemoglobin is 9.4 with platelets of 224 -Creatinine is 6.26 -COVID-19 test negative patient is vaccinated  against Covid - --Patient underwent hemodialysis in the ED with removal of 2 L of fluid -After hemo-dialysis weight was down to 68.5 kg -Patient ambulated in the hallways in the ED after hemodialysis without dyspnea on exertion no hypoxia  -Patient and her husband requested discharge home with outpatient hemodialysis in the usual center in ED on 08/11/2019 at 7 AM  -Patient and husband repeatedly denies chest pains     Review of systems:    In addition to the HPI above,   A full Review of  Systems was done, all other systems reviewed are negative except as noted above in HPI , .    Social History:  Reviewed by me    Social History   Tobacco Use   Smoking status: Never Smoker   Smokeless tobacco: Never Used  Substance Use Topics   Alcohol use: No     Family History :  Reviewed  by me    Family History  Problem Relation Age of Onset   Kidney disease Mother    Diabetes Mother    Diabetes Father    Heart disease Father      Home Medications:   Prior to Admission medications   Medication Sig Start Date End Date Taking? Authorizing Provider  acetaminophen (TYLENOL) 500 MG tablet Take 500 mg by mouth every 6 (six) hours as needed for moderate pain or headache.  04/06/16  Yes [provider]  aspirin (ASPIR-LOW) 81 MG EC tablet Take 1 tablet (81 mg total) by mouth daily with breakfast. 05/27/19  Yes Idamay Hosein, MD  B Complex-C-Folic Acid (RENA-VITE RX) 1 MG TABS Take 1 tablet by mouth daily. 09/24/18  Yes [provider]  cloNIDine (CATAPRES) 0.1 MG tablet Take 1 tablet (0.1 mg total) by mouth daily. 07/08/19  Yes Hawks, Christy A, FNP  clotrimazole (MYCELEX) 10 MG troche Take 1 tablet (10 mg total) by mouth 5 (five) times daily. 08/07/19  Yes Hawks, Theador Hawthorne, FNP  Dulaglutide (TRULICITY) 1.5 CW/2.3JS SOPN INJECT 1.5MG   SUBCUTANEOUSLY ONCE A WEEK 07/08/19  Yes Hawks, Christy A, FNP  fluconazole (DIFLUCAN) 150 MG tablet Take 1 tablet (150 mg total) by  mouth every three (3) days as needed. 08/08/19  Yes Hawks, Christy A, FNP  insulin degludec (TRESIBA FLEXTOUCH) 100 UNIT/ML FlexTouch Pen Inject 0.15 mLs (15 Units total) into the skin daily. 07/08/19  Yes Hawks, Christy A, FNP  isosorbide mononitrate (IMDUR) 60 MG 24 hr tablet Take 1 tablet (60 mg total) by mouth daily. 07/08/19  Yes Hawks, Christy A, FNP  metoprolol succinate (TOPROL-XL) 25 MG 24 hr tablet Take 1 tablet (25 mg total) by mouth daily. 07/08/19  Yes Hawks, Christy A, FNP  nystatin (MYCOSTATIN) 100000 UNIT/ML suspension Take 5 mLs (500,000 Units total) by mouth 4 (four) times daily. 08/07/19  Yes Hawks, Christy A, FNP  ondansetron (ZOFRAN) 4 MG tablet Take 1 tablet (4 mg total) by mouth every 8 (eight) hours as needed for nausea or vomiting. 07/08/19  Yes Hawks, Christy A, FNP  predniSONE (DELTASONE) 5 MG tablet Take 1 tablet (5 mg total) by mouth daily with breakfast. 07/22/19  Yes Barton Dubois, MD  PROGRAF 0.5 MG capsule Take 0.5 mg by mouth daily. Take 0.5mg  by mouth each morning. 10/24/16  Yes [provider]  rosuvastatin (CRESTOR) 20 MG tablet Take 1 tablet (20 mg total) by mouth daily. 07/08/19  Yes Hawks, Christy A, FNP  senna (SENOKOT) 8.6 MG TABS tablet Take 1 tablet (8.6 mg total) by mouth 2 (two) times daily. 05/27/19  Yes Roxan Hockey, MD  sevelamer carbonate (RENVELA) 800 MG tablet Take 1,600-3,200 mg by mouth 5 (five) times daily. Patient takes 4 tablets(3200mg ) 3 times a day with meals and 2 tablets(1600mg ) 2 times a day with snacks 10/02/16  Yes [provider]     Allergies:     Allergies  Allergen Reactions   Other Other (See Comments)    Tape Bruises and tears skin, Paper tape tolerated   Tape Other (See Comments)    Bruises and tears skin Paper tape tolerated Bruises and tears skin Paper tape tolerated Bruises and tears skin Paper tape tolerated     Physical Exam:   Vitals  Blood pressure 113/72, pulse 62, temperature 97.7 F (36.5 C),  temperature source Axillary, resp. rate 14, height 5\' 9"  (1.753 m), weight 68 kg, SpO2 100 %.  Physical Examination: General appearance - alert, well appearing,  and in no distress  Mental status - alert, oriented to person, place, and time, Eyes - sclera anicteric Neck - supple, no JVD elevation , Chest fair air movement bilaterally no wheezing  heart - S1 and S2 normal, regular  Abdomen - soft, nontender, nondistended, no masses or organomegaly Neurological - screening mental status exam normal, neck supple without rigidity, cranial nerves II through XII intact, DTR's normal and symmetric Extremities - no pedal edema noted, intact peripheral pulses  Skin - warm, dry MSK-left forearm AV fistula with positive thrill and bruit     Data Review:    CBC Recent Labs  Lab 08/10/19 0802  WBC 10.1  HGB 9.4*  HCT 32.0*  PLT 224  MCV 95.8  MCH 28.1  MCHC 29.4*  RDW 16.1*  LYMPHSABS 0.9  MONOABS 0.4  EOSABS 0.4  BASOSABS 0.0   ------------------------------------------------------------------------------------------------------------------  Chemistries  Recent Labs  Lab 08/10/19 0802  NA 138  K 4.0  CL 98  CO2 29  GLUCOSE 141*  BUN 31*  CREATININE 6.26*  CALCIUM 8.8*  AST 19  ALT 17  ALKPHOS 78  BILITOT 0.7   ------------------------------------------------------------------------------------------------------------------ estimated creatinine clearance is 9.5 mL/min (A) (by C-G formula based on SCr of 6.26 mg/dL (H)). ------------------------------------------------------------------------------------------------------------------ No results for input(s): TSH, T4TOTAL, T3FREE, THYROIDAB in the last 72 hours.  Invalid input(s): FREET3   Coagulation profile No results for input(s): INR, PROTIME in the last 168 hours. ------------------------------------------------------------------------------------------------------------------- No results for input(s): DDIMER in  the last 72 hours. -------------------------------------------------------------------------------------------------------------------  Cardiac Enzymes No results for input(s): CKMB, TROPONINI, MYOGLOBIN in the last 168 hours.  Invalid input(s): CK ------------------------------------------------------------------------------------------------------------------    Component Value Date/Time   BNP >4,500.0 (H) 08/10/2019 0802     ---------------------------------------------------------------------------------------------------------------  Urinalysis    Component Value Date/Time   COLORURINE YELLOW 12/02/2018 0622   APPEARANCEUR Cloudy (A) 01/23/2019 1200   LABSPEC 1.013 12/02/2018 0622   PHURINE 7.0 12/02/2018 0622   GLUCOSEU Trace (A) 01/23/2019 1200   HGBUR SMALL (A) 12/02/2018 0622   BILIRUBINUR Negative 01/23/2019 1200   KETONESUR NEGATIVE 12/02/2018 0622   PROTEINUR 2+ (A) 01/23/2019 1200   PROTEINUR 100 (A) 12/02/2018 0622   NITRITE Negative 01/23/2019 1200   NITRITE NEGATIVE 12/02/2018 0622   LEUKOCYTESUR 3+ (A) 01/23/2019 1200   LEUKOCYTESUR LARGE (A) 12/02/2018 0622    ----------------------------------------------------------------------------------------------------------------   Imaging Results:    DG Chest Portable 1 View  Result Date: 08/10/2019 CLINICAL DATA:  Pt reports woke up sob, slight nonproductive cough, and chest pain with inspiration. Denies fever. Denies any sick contacts. Reports has had 2 covid vaccines. Reports is a dialysis pt and is due for dialysis tomorrow. Has not missed any treatments. Recently finished antibiotic for strep throat and was given something for a yeast infection. Pt denies any on body medical injector devices or spinal stimulators, Pt has pending COVID test. EXAM: PORTABLE CHEST 1 VIEW COMPARISON:  07/20/2019 and older studies. FINDINGS: Cardiac silhouette is normal in size. No mediastinal or hilar masses. There is interstitial  thickening and central vascular congestion, similar to the most recent prior study, but representing a change from a chest radiograph dated 05/26/2019. Subtle airspace opacity in the perihilar left lung. No convincing pleural effusion and no pneumothorax. Skeletal structures are grossly intact. IMPRESSION: 1. Similar appearance to the most recent prior exam with bilateral interstitial thickening. Suspect interstitial edema. A component of subtle airspace edema is suggested in the left perihilar region. Electronically Signed   By: Dedra Skeens.D.  On: 08/10/2019 08:31    Radiological Exams on Admission: DG Chest Portable 1 View  Result Date: 08/10/2019 CLINICAL DATA:  Pt reports woke up sob, slight nonproductive cough, and chest pain with inspiration. Denies fever. Denies any sick contacts. Reports has had 2 covid vaccines. Reports is a dialysis pt and is due for dialysis tomorrow. Has not missed any treatments. Recently finished antibiotic for strep throat and was given something for a yeast infection. Pt denies any on body medical injector devices or spinal stimulators, Pt has pending COVID test. EXAM: PORTABLE CHEST 1 VIEW COMPARISON:  07/20/2019 and older studies. FINDINGS: Cardiac silhouette is normal in size. No mediastinal or hilar masses. There is interstitial thickening and central vascular congestion, similar to the most recent prior study, but representing a change from a chest radiograph dated 05/26/2019. Subtle airspace opacity in the perihilar left lung. No convincing pleural effusion and no pneumothorax. Skeletal structures are grossly intact. IMPRESSION: 1. Similar appearance to the most recent prior exam with bilateral interstitial thickening. Suspect interstitial edema. A component of subtle airspace edema is suggested in the left perihilar region. Electronically Signed   By: Lajean Manes M.D.   On: 08/10/2019 08:31    Family Communication: Admission, patients condition and plan of care  including tests being ordered have been discussed with the patient and husband at bedside who indicate understanding and agree with the plan   Code Status - Full Code  Likely DC to home from the ED  Condition   Stable--without dyspnea on exertion or hypoxia post ambulation  Roxan Hockey M.D on 08/10/2019 at 5:51 PM Go to [email or web address] -  for contact info  [facility] - Office  [phone]

## 2019-08-10 NOTE — ED Notes (Signed)
No c/o nausea or vomiting, tolerating Bi-PAP well.

## 2019-08-10 NOTE — ED Notes (Signed)
Pt eating meal at this time.  Husband at bedside.  No distress noted.

## 2019-08-10 NOTE — ED Triage Notes (Addendum)
Pt reports woke up sob, slight nonproductive cough, and chest pain with inspiration.  Denies fever.  Denies any sick contacts.  Reports has had 2 covid vaccines.  Reports is a dialysis pt and is due for dialysis tomorrow.  Has not missed any treatments.  Recently finished antibiotic for strep throat and was given something for a yeast infection.

## 2019-08-11 DIAGNOSIS — N186 End stage renal disease: Secondary | ICD-10-CM | POA: Diagnosis not present

## 2019-08-11 DIAGNOSIS — Z992 Dependence on renal dialysis: Secondary | ICD-10-CM | POA: Diagnosis not present

## 2019-08-13 DIAGNOSIS — N186 End stage renal disease: Secondary | ICD-10-CM | POA: Diagnosis not present

## 2019-08-13 DIAGNOSIS — Z992 Dependence on renal dialysis: Secondary | ICD-10-CM | POA: Diagnosis not present

## 2019-08-15 DIAGNOSIS — N186 End stage renal disease: Secondary | ICD-10-CM | POA: Diagnosis not present

## 2019-08-15 DIAGNOSIS — Z992 Dependence on renal dialysis: Secondary | ICD-10-CM | POA: Diagnosis not present

## 2019-08-18 DIAGNOSIS — Z992 Dependence on renal dialysis: Secondary | ICD-10-CM | POA: Diagnosis not present

## 2019-08-18 DIAGNOSIS — N186 End stage renal disease: Secondary | ICD-10-CM | POA: Diagnosis not present

## 2019-08-20 DIAGNOSIS — Z992 Dependence on renal dialysis: Secondary | ICD-10-CM | POA: Diagnosis not present

## 2019-08-20 DIAGNOSIS — N186 End stage renal disease: Secondary | ICD-10-CM | POA: Diagnosis not present

## 2019-08-22 DIAGNOSIS — Z992 Dependence on renal dialysis: Secondary | ICD-10-CM | POA: Diagnosis not present

## 2019-08-22 DIAGNOSIS — N186 End stage renal disease: Secondary | ICD-10-CM | POA: Diagnosis not present

## 2019-08-25 DIAGNOSIS — N186 End stage renal disease: Secondary | ICD-10-CM | POA: Diagnosis not present

## 2019-08-25 DIAGNOSIS — Z992 Dependence on renal dialysis: Secondary | ICD-10-CM | POA: Diagnosis not present

## 2019-08-27 DIAGNOSIS — N186 End stage renal disease: Secondary | ICD-10-CM | POA: Diagnosis not present

## 2019-08-27 DIAGNOSIS — Z992 Dependence on renal dialysis: Secondary | ICD-10-CM | POA: Diagnosis not present

## 2019-08-29 DIAGNOSIS — Z992 Dependence on renal dialysis: Secondary | ICD-10-CM | POA: Diagnosis not present

## 2019-08-29 DIAGNOSIS — N186 End stage renal disease: Secondary | ICD-10-CM | POA: Diagnosis not present

## 2019-09-01 DIAGNOSIS — N186 End stage renal disease: Secondary | ICD-10-CM | POA: Diagnosis not present

## 2019-09-01 DIAGNOSIS — Z992 Dependence on renal dialysis: Secondary | ICD-10-CM | POA: Diagnosis not present

## 2019-09-02 ENCOUNTER — Other Ambulatory Visit (INDEPENDENT_AMBULATORY_CARE_PROVIDER_SITE_OTHER): Payer: BC Managed Care – PPO

## 2019-09-02 ENCOUNTER — Other Ambulatory Visit: Payer: Self-pay

## 2019-09-02 DIAGNOSIS — Z09 Encounter for follow-up examination after completed treatment for conditions other than malignant neoplasm: Secondary | ICD-10-CM

## 2019-09-02 DIAGNOSIS — J189 Pneumonia, unspecified organism: Secondary | ICD-10-CM | POA: Diagnosis not present

## 2019-09-03 DIAGNOSIS — Z992 Dependence on renal dialysis: Secondary | ICD-10-CM | POA: Diagnosis not present

## 2019-09-03 DIAGNOSIS — N186 End stage renal disease: Secondary | ICD-10-CM | POA: Diagnosis not present

## 2019-09-11 DIAGNOSIS — E119 Type 2 diabetes mellitus without complications: Secondary | ICD-10-CM | POA: Diagnosis not present

## 2019-09-11 DIAGNOSIS — H25813 Combined forms of age-related cataract, bilateral: Secondary | ICD-10-CM | POA: Diagnosis not present

## 2019-09-11 LAB — HM DIABETES EYE EXAM

## 2019-09-14 ENCOUNTER — Other Ambulatory Visit: Payer: Self-pay | Admitting: Family

## 2019-09-14 DIAGNOSIS — E785 Hyperlipidemia, unspecified: Secondary | ICD-10-CM

## 2019-09-14 DIAGNOSIS — E1169 Type 2 diabetes mellitus with other specified complication: Secondary | ICD-10-CM

## 2019-09-16 DIAGNOSIS — H4312 Vitreous hemorrhage, left eye: Secondary | ICD-10-CM | POA: Diagnosis not present

## 2019-09-16 DIAGNOSIS — H3561 Retinal hemorrhage, right eye: Secondary | ICD-10-CM | POA: Diagnosis not present

## 2019-09-16 DIAGNOSIS — H2513 Age-related nuclear cataract, bilateral: Secondary | ICD-10-CM | POA: Diagnosis not present

## 2019-09-16 DIAGNOSIS — E113593 Type 2 diabetes mellitus with proliferative diabetic retinopathy without macular edema, bilateral: Secondary | ICD-10-CM | POA: Diagnosis not present

## 2019-09-23 DIAGNOSIS — E113592 Type 2 diabetes mellitus with proliferative diabetic retinopathy without macular edema, left eye: Secondary | ICD-10-CM | POA: Diagnosis not present

## 2019-10-04 DIAGNOSIS — N186 End stage renal disease: Secondary | ICD-10-CM | POA: Diagnosis not present

## 2019-10-04 DIAGNOSIS — Z992 Dependence on renal dialysis: Secondary | ICD-10-CM | POA: Diagnosis not present

## 2019-10-21 DIAGNOSIS — I5032 Chronic diastolic (congestive) heart failure: Secondary | ICD-10-CM | POA: Diagnosis not present

## 2019-10-21 DIAGNOSIS — I272 Pulmonary hypertension, unspecified: Secondary | ICD-10-CM | POA: Diagnosis not present

## 2019-11-04 DIAGNOSIS — N186 End stage renal disease: Secondary | ICD-10-CM | POA: Diagnosis not present

## 2019-11-04 DIAGNOSIS — Z992 Dependence on renal dialysis: Secondary | ICD-10-CM | POA: Diagnosis not present

## 2019-11-11 ENCOUNTER — Ambulatory Visit (INDEPENDENT_AMBULATORY_CARE_PROVIDER_SITE_OTHER): Payer: Medicare Other | Admitting: Family Medicine

## 2019-11-11 ENCOUNTER — Encounter: Payer: Self-pay | Admitting: Family Medicine

## 2019-11-11 DIAGNOSIS — N760 Acute vaginitis: Secondary | ICD-10-CM

## 2019-11-11 DIAGNOSIS — R110 Nausea: Secondary | ICD-10-CM

## 2019-11-11 MED ORDER — FLUCONAZOLE 150 MG PO TABS
150.0000 mg | ORAL_TABLET | Freq: Once | ORAL | 0 refills | Status: AC
Start: 2019-11-11 — End: 2019-11-11

## 2019-11-11 MED ORDER — ONDANSETRON HCL 4 MG PO TABS: 4.0000 mg | ORAL_TABLET | Freq: Three times a day (TID) | ORAL | 0 refills | Status: DC | PRN

## 2019-11-11 NOTE — Progress Notes (Signed)
Subjective:    Patient ID: [name], female    DOB: Mar 28, 1955, 64 y.o.   MRN: [id number]   HPI: [name] is a 64 y.o. female presenting for itching in vulva. Denies rash. Having a little bit of discharge.  Onset 2 days ago. Nauseous too. Also vomiting. On dialysis so she makes very little urine, hardly any. .    Depression screen [facility] 2/9 07/31/2019 07/08/2019 04/22/2019 11/05/2018 10/01/2018  Decreased Interest 0 0 0 0 0  Down, Depressed, Hopeless 0 0 0 0 0  PHQ - 2 Score 0 0 0 0 0  Altered sleeping - - 0 - -  Tired, decreased energy - - 0 - -  Change in appetite - - 0 - -  Feeling bad or failure about yourself  - - 0 - -  Trouble concentrating - - 0 - -  Moving slowly or fidgety/restless - - 0 - -  Suicidal thoughts - - 0 - -  PHQ-9 Score - - 0 - -     Relevant past medical, surgical, family and social history reviewed and updated as indicated.  Interim medical history since our last visit reviewed. Allergies and medications reviewed and updated.  ROS:  Review of Systems  Constitutional: Negative for fever.  Genitourinary: Positive for vaginal discharge. Negative for flank pain, vaginal bleeding and vaginal pain.     Social History   Tobacco Use  Smoking Status Never Smoker  Smokeless Tobacco Never Used       Objective:     Wt Readings from Last 3 Encounters:  08/10/19 150 lb (68 kg)  07/31/19 152 lb 4 oz (69.1 kg)  07/21/19 149 lb 7.6 oz (67.8 kg)     Exam deferred. Pt. Harboring due to COVID 19. Phone visit performed.   Assessment & Plan:   1. Acute vaginitis   2. Nausea     Meds ordered this encounter  Medications  . fluconazole (DIFLUCAN) 150 MG tablet    Sig: Take 1 tablet (150 mg total) by mouth once for 1 dose. At onset of symptoms. Repeat in one week    Dispense:  2 tablet    Refill:  0  . ondansetron (ZOFRAN) 4 MG tablet    Sig: Take 1 tablet (4 mg total) by mouth every 8 (eight) hours as needed for nausea or vomiting.     Dispense:  20 tablet    Refill:  0    No orders of the defined types were placed in this encounter.     Diagnoses and all orders for this visit:  Acute vaginitis  Nausea -     ondansetron (ZOFRAN) 4 MG tablet; Take 1 tablet (4 mg total) by mouth every 8 (eight) hours as needed for nausea or vomiting.  Other orders -     fluconazole (DIFLUCAN) 150 MG tablet; Take 1 tablet (150 mg total) by mouth once for 1 dose. At onset of symptoms. Repeat in one week    Virtual Visit via telephone Note  I discussed the limitations, risks, security and privacy concerns of performing an evaluation and management service by telephone and the availability of in person appointments. The patient was identified with two identifiers. Pt.expressed understanding and agreed to proceed. Pt. Is at home. Dr. Livia Snellen is in his office.  Follow Up Instructions:   I discussed the assessment and treatment plan with the patient. The patient was provided an opportunity to ask questions and all were answered. The  patient agreed with the plan and demonstrated an understanding of the instructions.   The patient was advised to call back or seek an in-person evaluation if the symptoms worsen or if the condition fails to improve as anticipated.   Total minutes including chart review and phone contact time: 16   Follow up plan: Return if symptoms worsen or fail to improve.  Claretta Fraise, MD [place]

## 2019-11-12 ENCOUNTER — Telehealth: Payer: Self-pay | Admitting: *Deleted

## 2019-11-12 NOTE — Telephone Encounter (Signed)
PA in process:  KeyCharlett Lango - Rx #: [id number]

## 2019-11-12 NOTE — Telephone Encounter (Signed)
[employer] Rep called stating that the Ondansetron Rx was denied but can be appealed. Says the diagnosis we sent is good but need to give reason as to what is causing pt to have nausea.

## 2019-11-13 NOTE — Telephone Encounter (Signed)
Denied - FYI

## 2019-11-26 IMAGING — CT CT ABD-PELV W/ CM
2 of 5 series · 16 of 46 positions shown, 18 images · IV contrast (Isovue)
Comparison: 05/30/2017

CLINICAL DATA: Right-sided abdominal pain with vomiting for 10 days

EXAM:
CT ABDOMEN AND PELVIS WITH CONTRAST
TECHNIQUE: Multidetector CT imaging of the abdomen and pelvis was performed
using the standard protocol following bolus administration of
intravenous contrast.
CONTRAST:  100mL IGOUHO-GOO IOPAMIDOL (IGOUHO-GOO) INJECTION 61%

[Series 2: axial st · axial · 0.72mm/px · z∈[+752,+1167]mm · 13 of 95 slices shown, 15 images]
[im 6/95  soft-tissue]
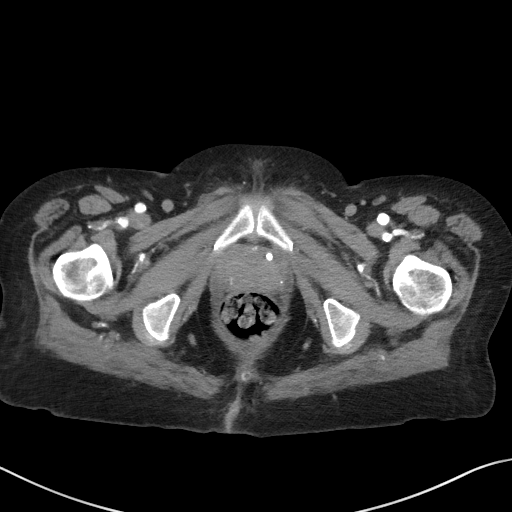
[im 6/95  bone]
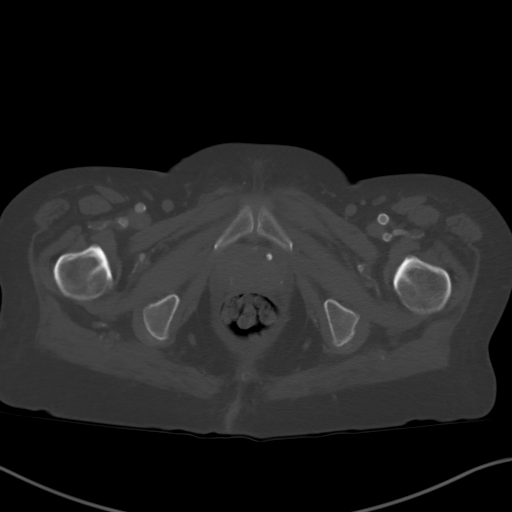
[im 12/95  soft-tissue]
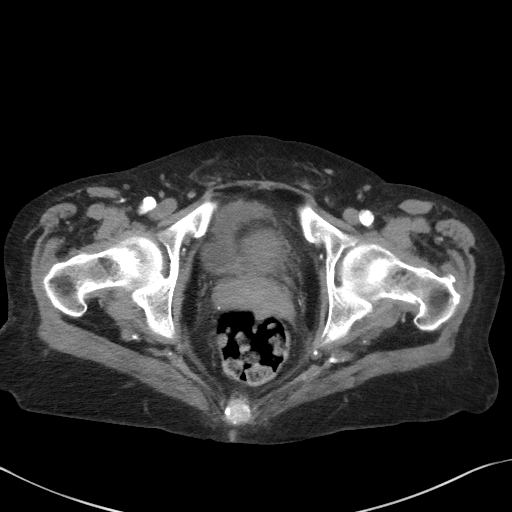
[im 18/95  soft-tissue]
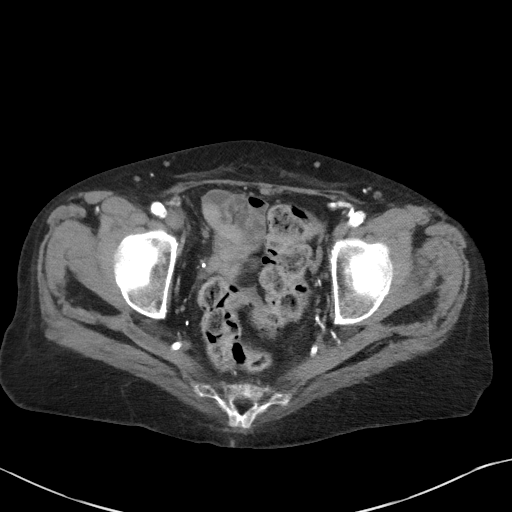
[im 30/95  soft-tissue]
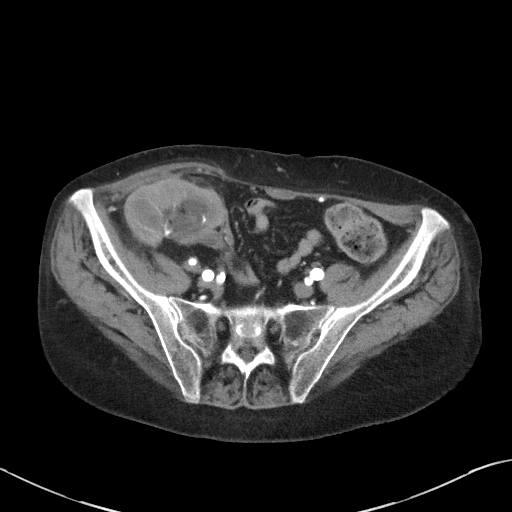
[im 36/95  soft-tissue]
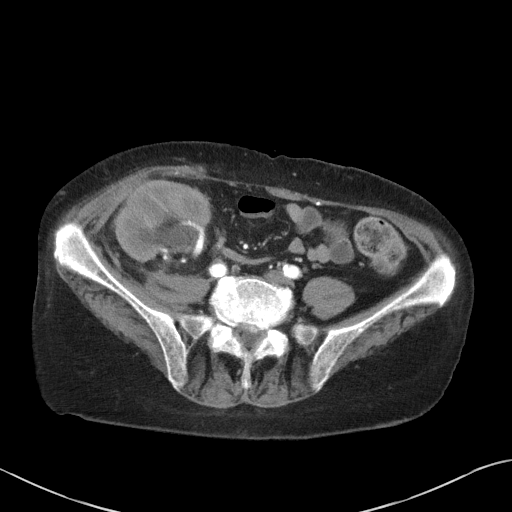
[im 42/95  soft-tissue]
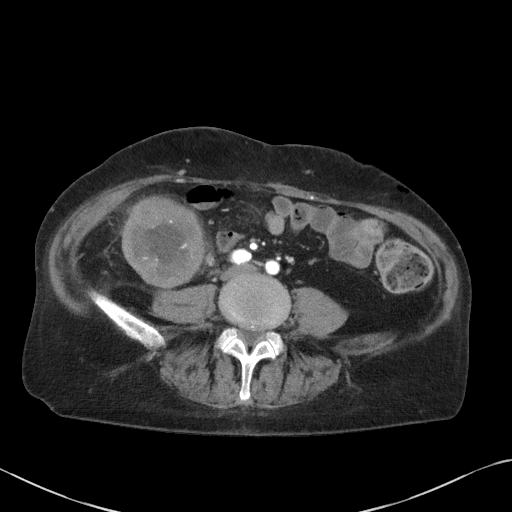
[im 48/95  soft-tissue]
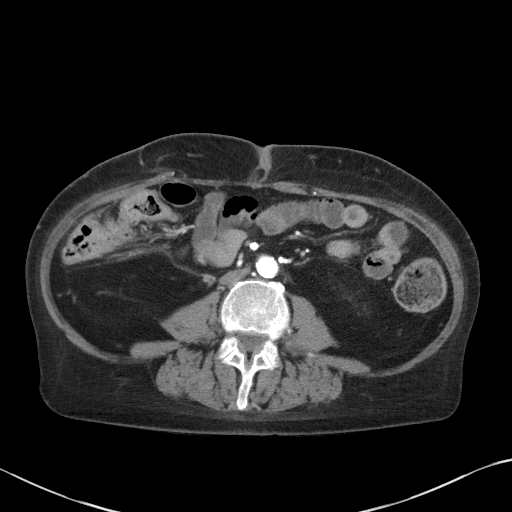
[im 53/95  soft-tissue]
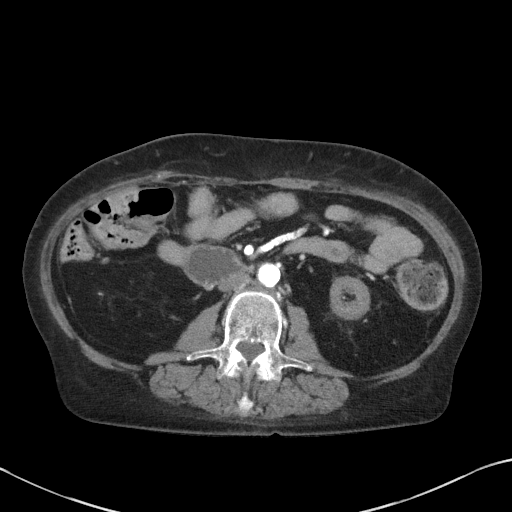
[im 59/95  soft-tissue]
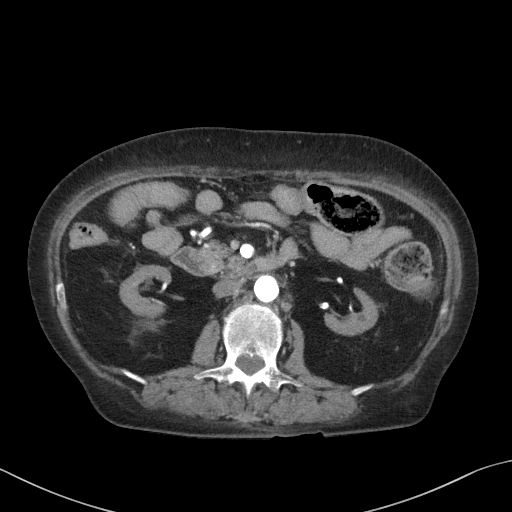
[im 59/95  bone]
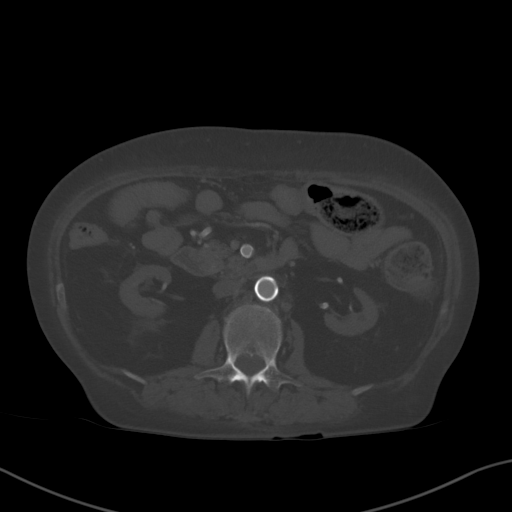
[im 65/95  soft-tissue]
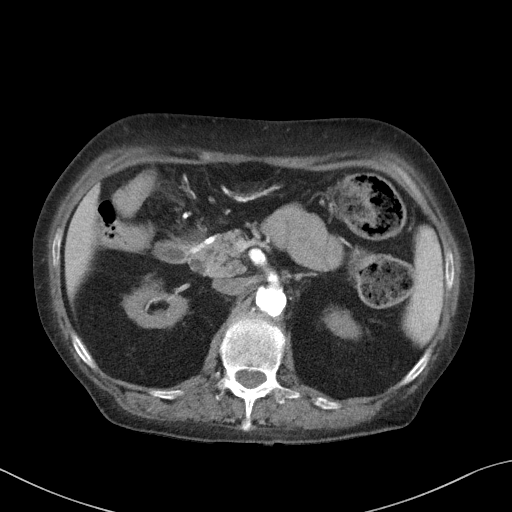
[im 77/95  soft-tissue]
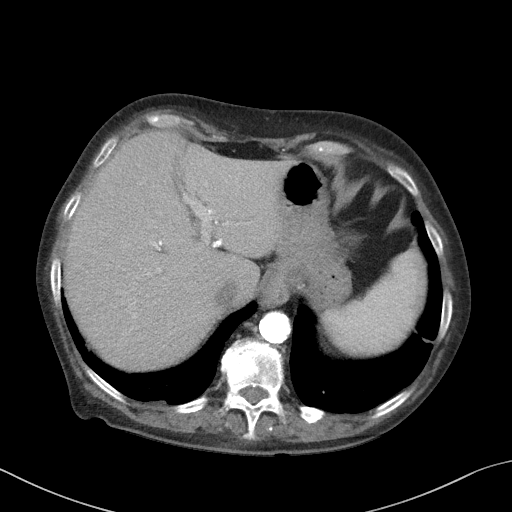
[im 83/95  soft-tissue]
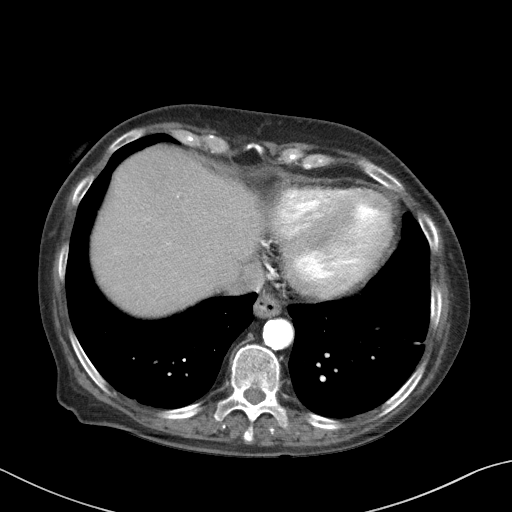
[im 89/95  soft-tissue]
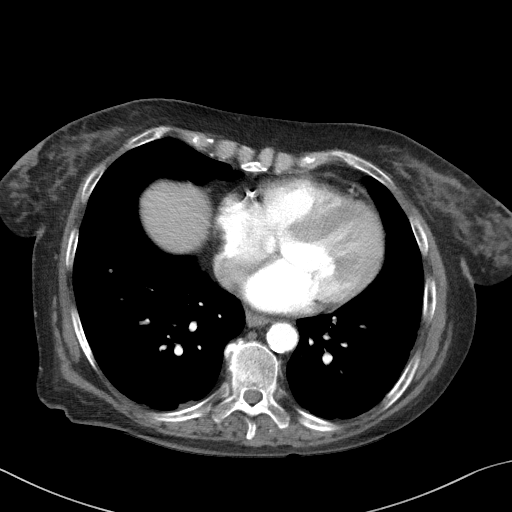

[Series 5: coronal st · coronal · 0.69mm/px · 3 of 87 slices shown]
[im 29/87  soft-tissue]
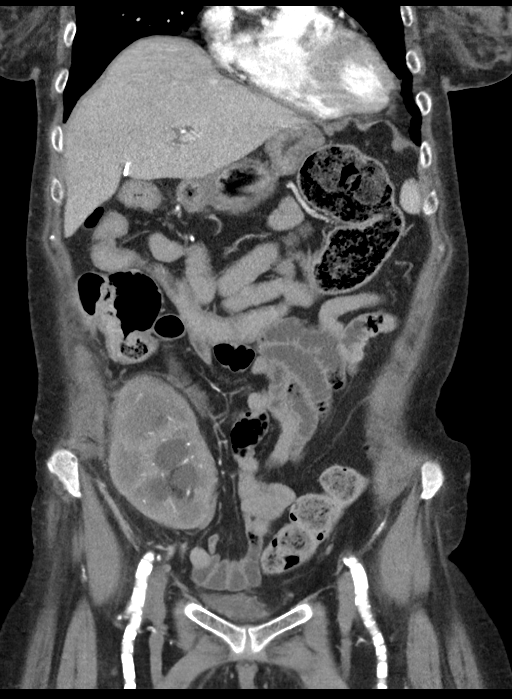
[im 39/87  soft-tissue]
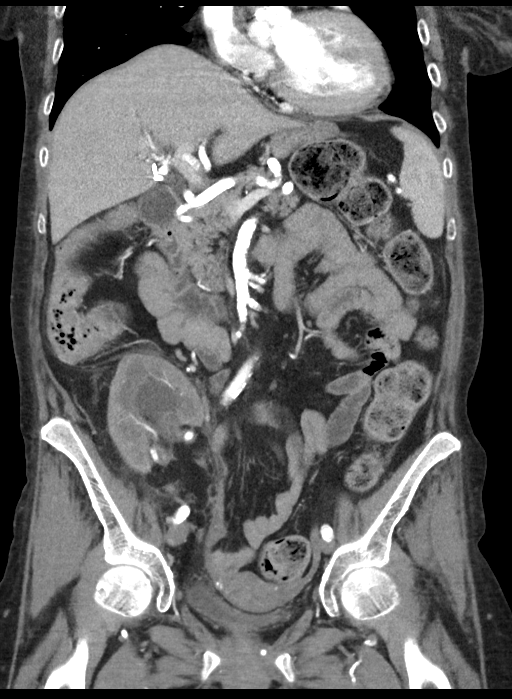
[im 48/87  soft-tissue]
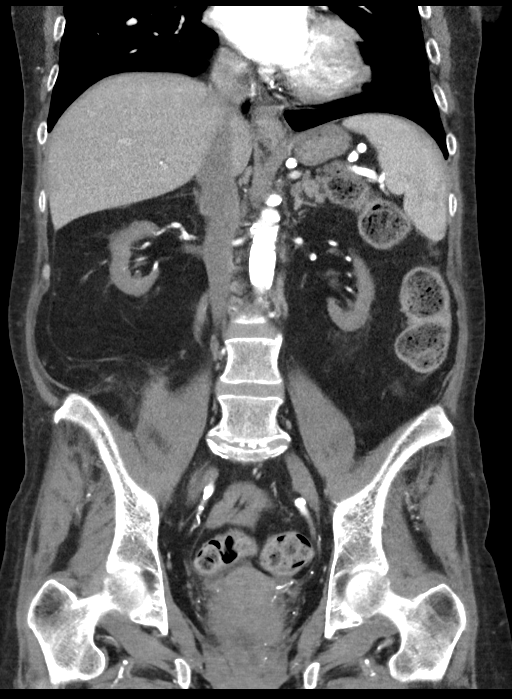

[16 of 46 positions shown; findings below may reference images not displayed]

FINDINGS: Lower chest: Diffuse coronary atherosclerotic calcification. No
acute finding

Hepatobiliary: No focal liver abnormality.Cholecystectomy. Normal
common bile duct diameter.

Pancreas: Unremarkable.

Spleen: Unremarkable.

Adrenals/Urinary Tract: Negative adrenals. End-stage renal disease
with atrophic native kidneys that are nonenhancing. A transplant
kidney in the right lower quadrant is normal size and enhancing but
reportedly nonfunctional. There is transplant moderate
hydronephrosis that is new from prior. The urothelium at the level
of the ureter appears thickened, although there is no notable
straining at the renal pelvis. Mild perinephric stranding that is
stable. No abscess is seen. Small volume urinary bladder.

Stomach/Bowel: Formed stool throughout the descending colon,
sigmoid, and rectum. No bowel obstruction. No appendicitis.

Vascular/Lymphatic: Diffuse atherosclerotic calcification without
branch occlusion or other acute finding. No mass or adenopathy.

Reproductive:No pathologic findings.

Other: No pneumoperitoneum.  Trace pelvic ascites

Musculoskeletal: No acute abnormalities. Remote T11 inferior
endplate fracture. L5-S1 degenerative disc narrowing.
IMPRESSION: 1. New hydronephrosis of the right lower quadrant transplant kidney
with mild urothelial thickening of the transplant ureter, possible
infection in this setting.
2. Formed stool throughout the left colon, please correlate for
constipation.

## 2019-11-27 IMAGING — US US RENAL TRANSPLANT
1 series · 13 of 25 positions shown · non-contrast
Comparison: CT abdomen and pelvis 03/11/2018

CLINICAL DATA: Renal transplant, hydronephrosis

EXAM:
ULTRASOUND OF RENAL TRANSPLANT WITH RENAL DOPPLER ULTRASOUND
TECHNIQUE: Ultrasound examination of the renal transplant was performed with
gray-scale, color and duplex doppler evaluation.

[Series 1: us renal transplant · 13 of 105 slices shown]
[im 1/105]
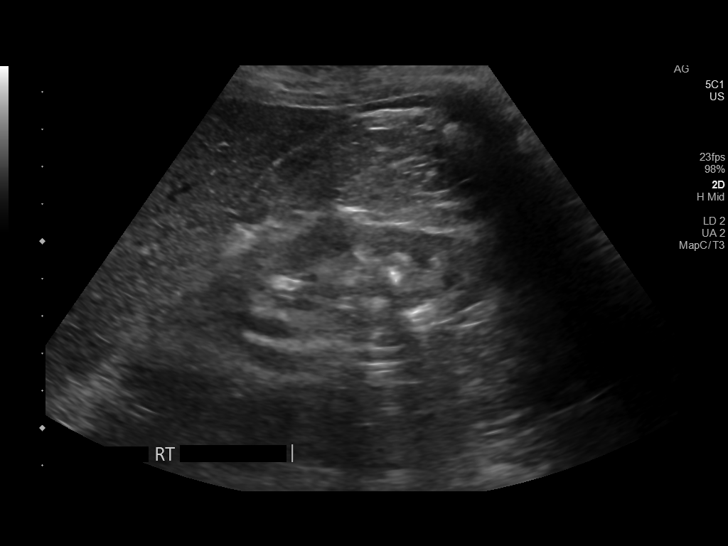
[im 9/105]
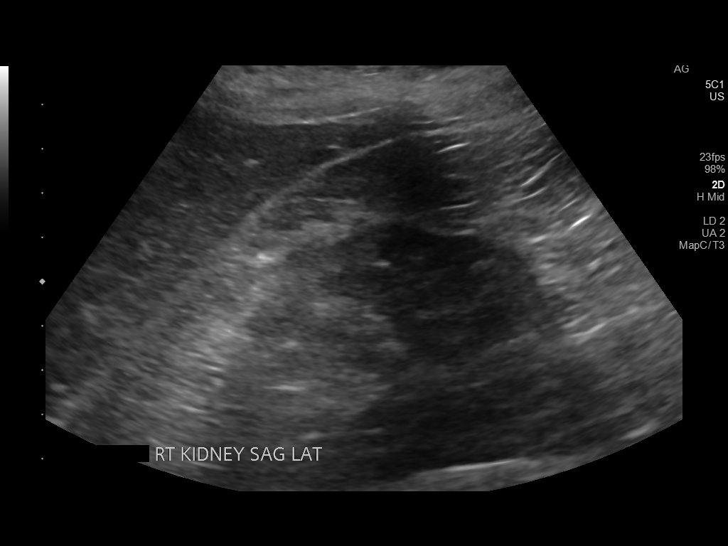
[im 18/105]
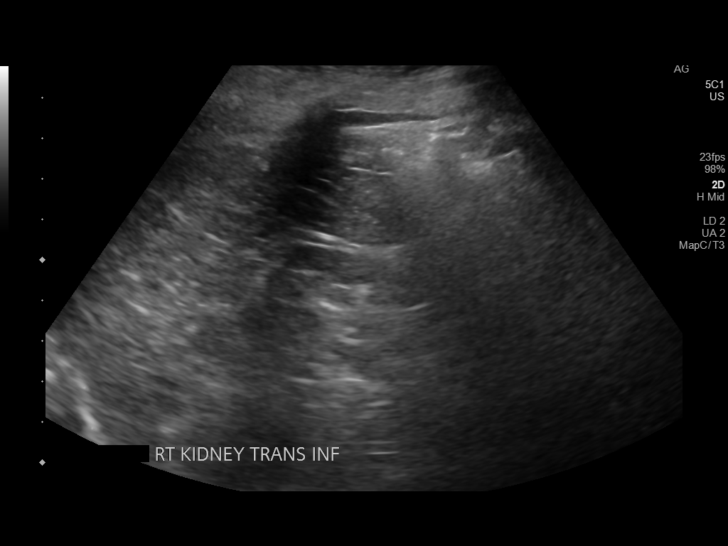
[im 27/105]
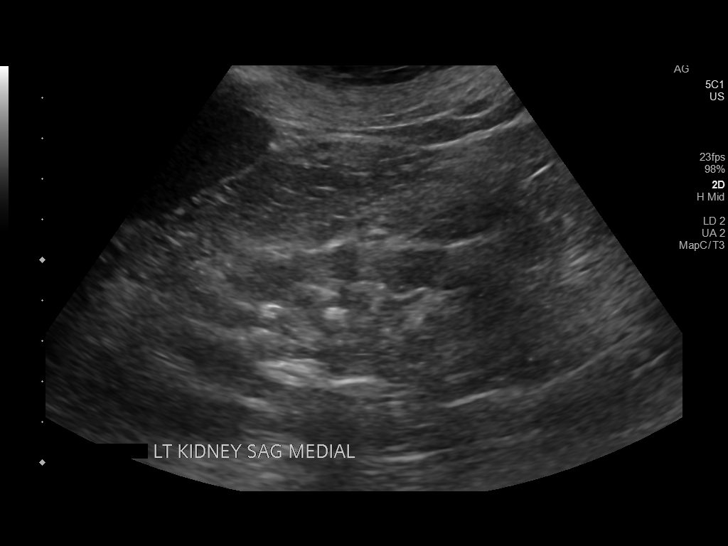
[im 35/105]
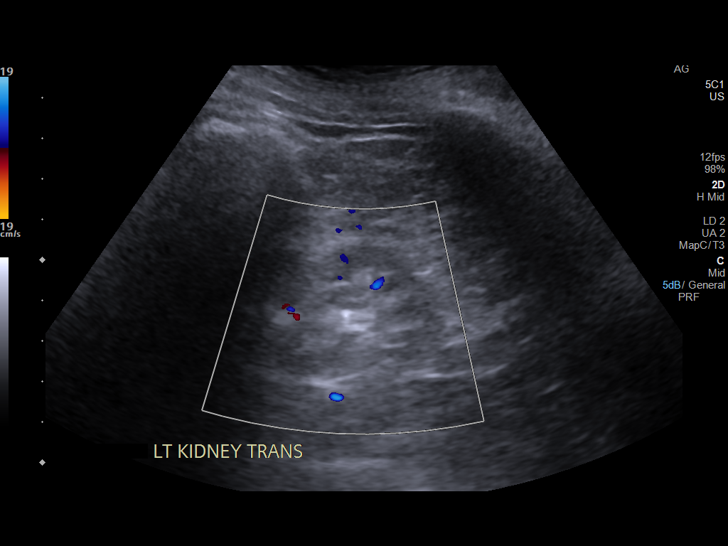
[im 44/105]
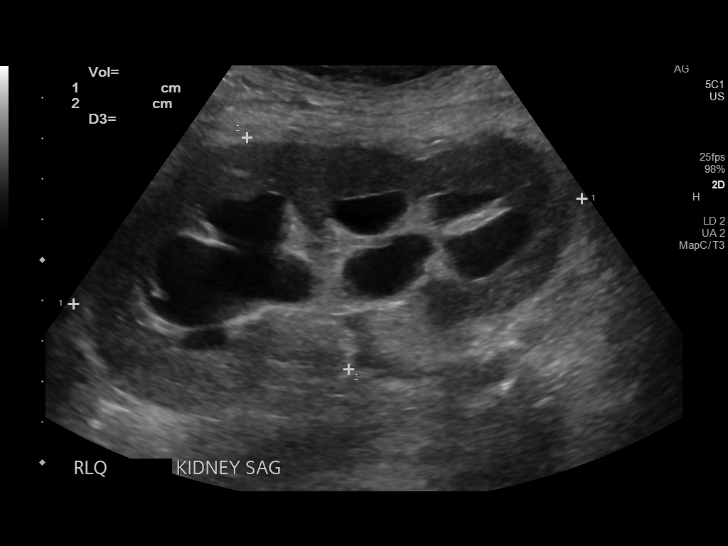
[im 53/105]
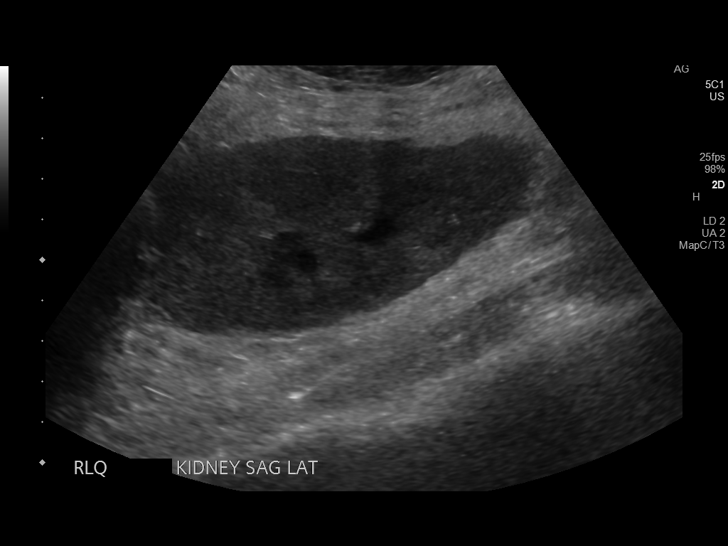
[im 61/105]
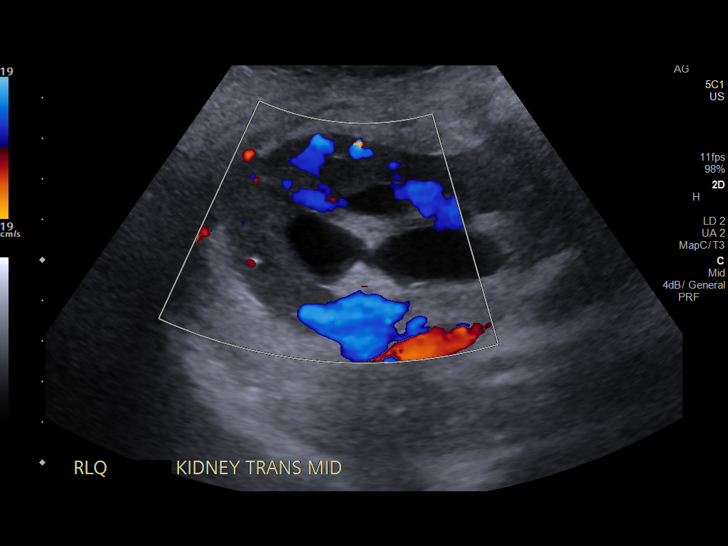
[im 70/105]
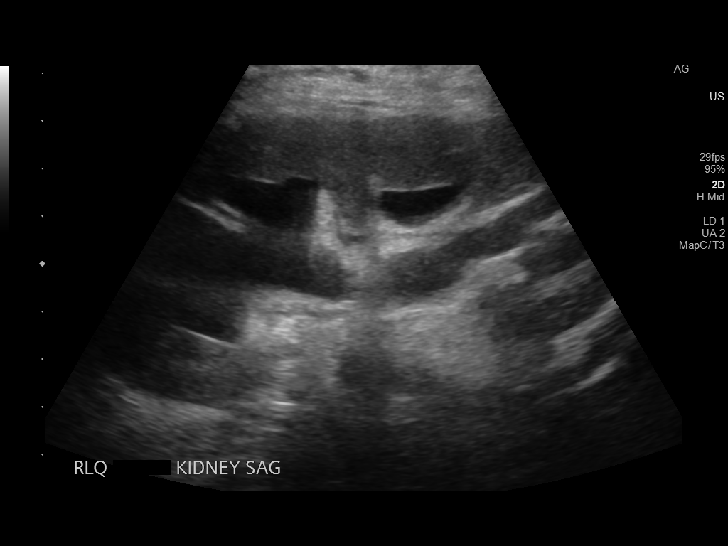
[im 79/105]
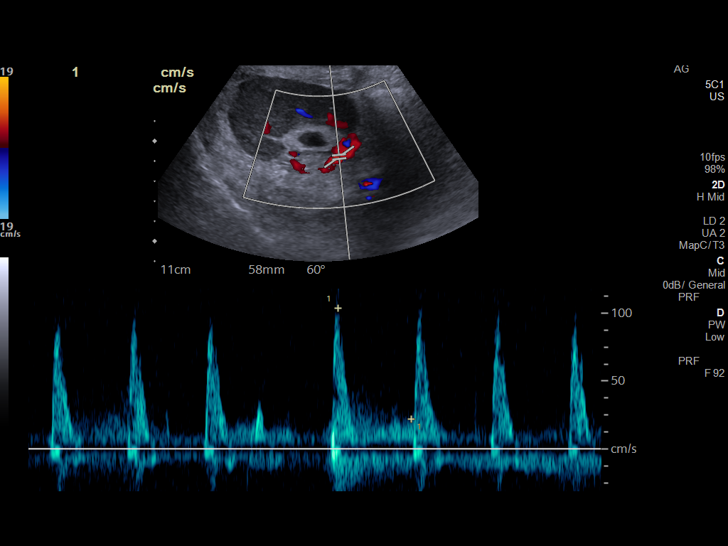
[im 87/105]
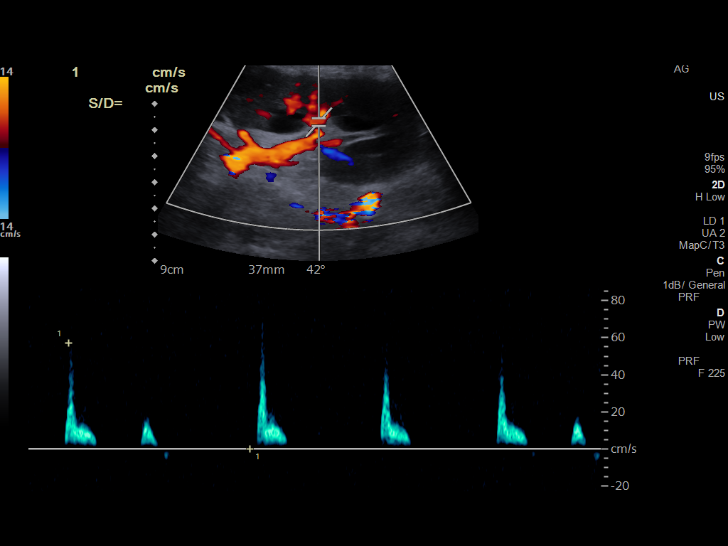
[im 96/105]
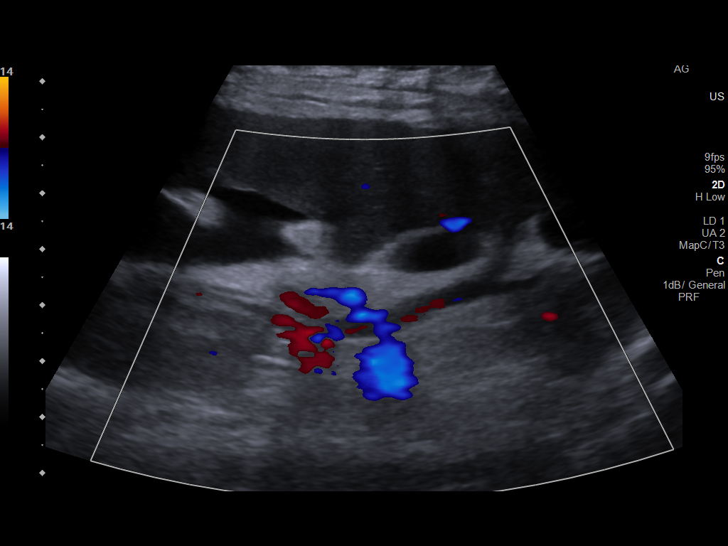
[im 105/105]
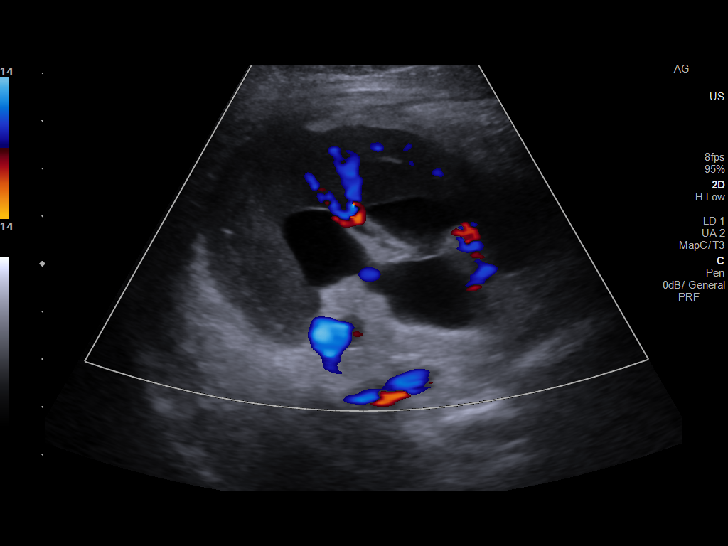

[13 of 25 positions shown; findings below may reference images not displayed]

FINDINGS: Native kidneys:

RIGHT KIDNEY: 7.8 x 3.4 x 4.1 cm with volume 56.1 mL. Renal cortical
atrophy and increased cortical echogenicity. No gross mass or
hydronephrosis. Nonshadowing echogenic foci of renal hilum without
calculi by prior CT.

LEFT KIDNEY: 9.0 x 3.7 x 4.1 cm with volume 70.8 mL. Cortical
thinning and increased cortical echogenicity. No mass or
hydronephrosis. Nonshadowing echogenic foci at renal hilum, without
calculi by prior CT.

Transplant kidney location: RIGHT iliac fossa

Transplant Kidney:

Renal measurements: 12.8 x 6.3 x 6.5 cm = volume: 270.1mL. Normal
cortical thickness and parenchymal echogenicity. No mass or
shadowing calcification. Moderate hydronephrosis present.

Color flow in the main renal artery:  Present

Color flow in the main renal vein:  Present

Duplex Doppler Evaluation:

Main Renal Artery Resistive Index:

Venous waveform in main renal vein:  Present

Intrarenal resistive index in upper pole:

(normal 0.6-0.8; equivocal 0.8-0.9; abnormal >= 0.9)

Intrarenal resistive index in lower pole:

(normal 0.6-0.8; equivocal 0.8-0.9; abnormal >= 0.9)

Bladder: Normal for degree of bladder distention. Ureteral jets were
not visualized.

Other findings:  None.
IMPRESSION: Moderate transplant hydronephrosis.

Elevated resistive indices consistent with the visualized moderate
degree of hydronephrosis, though this could also be seen in patients
with acute tubular necrosis, transplant rejection, and with drug
toxicity.

## 2019-12-23 ENCOUNTER — Encounter (HOSPITAL_COMMUNITY): Payer: Self-pay | Admitting: *Deleted

## 2019-12-23 ENCOUNTER — Other Ambulatory Visit: Payer: Self-pay

## 2019-12-23 ENCOUNTER — Encounter: Payer: Medicare Other | Admitting: Family Medicine

## 2019-12-23 ENCOUNTER — Encounter: Payer: Self-pay | Admitting: Family Medicine

## 2019-12-23 ENCOUNTER — Emergency Department (HOSPITAL_COMMUNITY)
Admission: EM | Admit: 2019-12-23 | Discharge: 2019-12-24 | Disposition: A | Discharge status: Home / Self Care | Payer: Medicare Other | Attending: Emergency Medicine | Admitting: Emergency Medicine

## 2019-12-23 DIAGNOSIS — Z7982 Long term (current) use of aspirin: Secondary | ICD-10-CM | POA: Insufficient documentation

## 2019-12-23 DIAGNOSIS — E1169 Type 2 diabetes mellitus with other specified complication: Secondary | ICD-10-CM | POA: Diagnosis not present

## 2019-12-23 DIAGNOSIS — R6881 Early satiety: Secondary | ICD-10-CM | POA: Diagnosis not present

## 2019-12-23 DIAGNOSIS — R110 Nausea: Secondary | ICD-10-CM | POA: Diagnosis not present

## 2019-12-23 DIAGNOSIS — I5043 Acute on chronic combined systolic (congestive) and diastolic (congestive) heart failure: Secondary | ICD-10-CM | POA: Diagnosis not present

## 2019-12-23 DIAGNOSIS — Z992 Dependence on renal dialysis: Secondary | ICD-10-CM | POA: Diagnosis not present

## 2019-12-23 DIAGNOSIS — Z79899 Other long term (current) drug therapy: Secondary | ICD-10-CM | POA: Insufficient documentation

## 2019-12-23 DIAGNOSIS — N186 End stage renal disease: Secondary | ICD-10-CM | POA: Diagnosis not present

## 2019-12-23 DIAGNOSIS — Z794 Long term (current) use of insulin: Secondary | ICD-10-CM | POA: Diagnosis not present

## 2019-12-23 DIAGNOSIS — R109 Unspecified abdominal pain: Secondary | ICD-10-CM | POA: Diagnosis not present

## 2019-12-23 DIAGNOSIS — I132 Hypertensive heart and chronic kidney disease with heart failure and with stage 5 chronic kidney disease, or end stage renal disease: Secondary | ICD-10-CM | POA: Diagnosis not present

## 2019-12-23 DIAGNOSIS — I2510 Atherosclerotic heart disease of native coronary artery without angina pectoris: Secondary | ICD-10-CM | POA: Insufficient documentation

## 2019-12-23 DIAGNOSIS — R112 Nausea with vomiting, unspecified: Secondary | ICD-10-CM | POA: Diagnosis present

## 2019-12-23 LAB — COMPREHENSIVE METABOLIC PANEL
ALT: 22 U/L (ref 0–44)
AST: 27 U/L (ref 15–41)
Albumin: 3.2 g/dL — ABNORMAL LOW (ref 3.5–5.0)
Alkaline Phosphatase: 81 U/L (ref 38–126)
Anion gap: 12 (ref 5–15)
BUN: 31 mg/dL — ABNORMAL HIGH (ref 8–23)
CO2: 28 mmol/L (ref 22–32)
Calcium: 9 mg/dL (ref 8.9–10.3)
Chloride: 95 mmol/L — ABNORMAL LOW (ref 98–111)
Creatinine, Ser: 7.08 mg/dL — ABNORMAL HIGH (ref 0.44–1.00)
GFR, Estimated: 6 mL/min — ABNORMAL LOW (ref >60)
Glucose, Bld: 273 mg/dL — ABNORMAL HIGH (ref 70–99)
Potassium: 5.7 mmol/L — ABNORMAL HIGH (ref 3.5–5.1)
Sodium: 135 mmol/L (ref 135–145)
Total Bilirubin: 0.5 mg/dL (ref 0.3–1.2)
Total Protein: 7.4 g/dL (ref 6.5–8.1)

## 2019-12-23 LAB — CBC
HCT: 40.5 % (ref 36.0–46.0)
Hemoglobin: 12.1 g/dL (ref 12.0–15.0)
MCH: 28.8 pg (ref 26.0–34.0)
MCHC: 29.9 g/dL — ABNORMAL LOW (ref 30.0–36.0)
MCV: 96.4 fL (ref 80.0–100.0)
Platelets: 224 10*3/uL (ref 150–400)
RBC: 4.2 MIL/uL (ref 3.87–5.11)
RDW: 15.5 % (ref 11.5–15.5)
WBC: 11.3 10*3/uL — ABNORMAL HIGH (ref 4.0–10.5)
nRBC: 0 % (ref 0.0–0.2)

## 2019-12-23 LAB — LIPASE, BLOOD: Lipase: 36 U/L (ref 11–51)

## 2019-12-23 NOTE — Progress Notes (Signed)
Went to E.D. Visit cancelled [facility]

## 2019-12-23 NOTE — ED Triage Notes (Signed)
Pt with abd pain for a month.  Had a video appt with PCP today but came here instead.  Poor appetite for a month as well.  + N/V, denies diarrhea.

## 2019-12-24 DIAGNOSIS — R110 Nausea: Secondary | ICD-10-CM | POA: Diagnosis not present

## 2019-12-24 MED ORDER — METOCLOPRAMIDE HCL 10 MG PO TABS
10.0000 mg | ORAL_TABLET | Freq: Once | ORAL | Status: AC
  Administered 2019-12-24: 10 mg via ORAL
  Filled 2019-12-24: qty 1

## 2019-12-24 MED ORDER — METOCLOPRAMIDE HCL 5 MG PO TABS: 5.0000 mg | ORAL_TABLET | Freq: Three times a day (TID) | ORAL | 0 refills | Status: DC

## 2019-12-24 NOTE — Discharge Instructions (Signed)
Take the Reglan 30 minutes prior to eating and at bedtime.  Please call Dr. Colman Cater office to get a follow-up appointment to evaluate your nausea and feeling full quickly.  Return to the emergency department if you get fever, constant abdominal pain or you feel like you are dehydrated.  Keep your dialysis appointment this morning.  Your potassium is a little bit high at 5.7.

## 2019-12-24 NOTE — ED Provider Notes (Signed)
[facility] EMERGENCY DEPARTMENT Provider Note   CSN: [id number] Arrival date & time: 12/23/19  1627   Time seen 12:20 AM  History Chief Complaint  Patient presents with  . Abdominal Pain    [name] is a 64 y.o. female.  HPI   Patient has end-stage renal disease for about 4 years.  She states she goes to [place] in [place].  She goes on Mondays, Wednesdays (later this morning at 7:15 AM) and Fridays.  She states she has been having problems for about the past month.  She states she cannot eat.  She states she only takes a few bites and then she gets very nauseated.  Sometimes she has vomiting.  She states she only vomits about every other day and at one time.  She states that all food not just certain types of foods.  She states she feels like she gets full very quickly.  Sometimes she gets abdominal pain that is diffuse and last about an hour but she does not always have pain.  She denies losing weight.  It can happen anytime of the day not just the morning.  She denies diarrhea or fever and states she has not made any urine for at least 6 months.  She does states she gets cold chills and feels hot but she never has a fever when she goes to dialysis.  She states her blood pressures have been higher recently but sometimes during dialysis it gets too low and have to watch her for a while before she leaves.  She states she just feels weak today because she has not been able to eat well.  Her doctor has prescribed her Zofran which is not helping.  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  She was on a medication however she is not on it now.  She states she had her a phone consult with her PCP while she was in the waiting room.  She states her CBGs have been doing well however her A1c did increase from 7-8 this past month.  Patient is status post cholecystectomy.  PCP Sharion Balloon, FNP   Past Medical History:    Diagnosis Date  . Anemia    of chronic disease  . Blood transfusion without reported diagnosis   . Cardiovascular disease    nonobstructive  . Carotid artery stenosis 2008  . CHF (congestive heart failure) ([place])   . Coronary artery disease   . Diabetes mellitus   . Dyslipidemia   . Edema of lower extremity    with hypo-albuminemia and profound protenuria  . Heart murmur   . Hypertension   . Mitral regurgitation   . Nephrotic syndrome   . Patent foramen ovale   . Pulmonary hypertension, moderate to severe ([place])   . Pulmonary nodule   . Tricuspid regurgitation   . Volume depletion, renal, due to output loss (renal deficit)     Patient Active Problem List   Diagnosis Date Noted  . Pulmonary edema 08/10/2019  . Acute on chronic combined systolic and diastolic CHF (congestive heart failure) -EF 40 to 45 % 08/10/2019  . Pneumonia 07/20/2019  . Nausea with vomiting, unspecified 05/25/2019  . Abnormal CXR (chest x-ray) 05/25/2019  . Acute diastolic CHF (congestive heart failure) ([place]) 12/27/2018  . Acute hypoxemic respiratory failure ([place]) 12/16/2018  . Dyspnea 12/15/2018  . Lung collapse 12/15/2018  . HCAP (healthcare-associated  pneumonia)   . Lobar pneumonia ([place]) 12/02/2018  . Acute respiratory failure with hypoxia ([place]) 12/02/2018  . Hydronephrosis   . Abnormal LFTs (liver function tests) 03/11/2018  . Type 2 diabetes mellitus ([place]) 03/11/2018  . Valvular heart disease 03/11/2018  . Chronic kidney disease on chronic dialysis ([place]) 11/13/2017  . Leukocytosis 09/21/2017  . Generalized weakness 09/21/2017  . Hyperlipidemia associated with type 2 diabetes mellitus ([place]) 02/22/2017  . ESRD (end stage renal disease) ([place]) 09/03/16  . 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 Encounter for aftercare following kidney transplant 05/22/2016  . Chronic diastolic heart failure ([place]) 06/08/2015  . Aortic valve sclerosis 06/08/2015  . Anemia of chronic disease  07/03/2013  . Essential hypertension 12/18/2010    Past Surgical History:  Procedure Laterality Date  . A/V FISTULAGRAM N/A 04/05/2017   Procedure: A/V FISTULAGRAM - Left Arm;  Surgeon: Angelia Mould, MD;  Location: [facility];  Service: Cardiovascular;  Laterality: N/A;  . A/V FISTULAGRAM Left 09/18/2017   Procedure: A/V FISTULAGRAM;  Surgeon: Serafina Mitchell, MD;  Location: [facility];  Service: Cardiovascular;  Laterality: Left;  . A/V SHUNT INTERVENTION Left 04/05/2017   Procedure: A/V SHUNT INTERVENTION;  Surgeon: Angelia Mould, MD;  Location: [facility];  Service: Cardiovascular;  Laterality: Left;  . CARDIAC CATHETERIZATION  2008  . IR REMOVAL TUN CV CATH W/O FL  11/02/2016  . 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 PERIPHERAL VASCULAR BALLOON ANGIOPLASTY Left 09/18/2017   Procedure: PERIPHERAL VASCULAR BALLOON ANGIOPLASTY;  Surgeon: Serafina Mitchell, MD;  Location: [facility];  Service: Cardiovascular;  Laterality: Left;  Arm Fistula     OB History   No obstetric history on file.     Family History  Problem Relation Age of Onset  . Kidney disease Mother   . Diabetes Mother   . Diabetes Father   . Heart disease Father     Social History   Tobacco Use  . Smoking status: Never Smoker  . Smokeless tobacco: Never Used  Vaping Use  . Vaping Use: Never used  Substance Use Topics  . Alcohol use: No  . Drug use: No    Home Medications Prior to Admission medications   Medication Sig Start Date End Date Taking? Authorizing Provider  acetaminophen (TYLENOL) 500 MG tablet Take 500 mg by mouth every 6 (six) hours as needed for moderate pain or headache.  04/06/16   [provider]  aspirin (ASPIR-LOW) 81 MG EC tablet Take 1 tablet (81 mg total) by mouth daily with breakfast. 05/27/19   [name], Courage, MD  B Complex-C-Folic Acid (RENA-VITE RX) 1 MG TABS Take 1 tablet by mouth daily. 09/24/18   [provider]    cloNIDine (CATAPRES) 0.1 MG tablet Take 1 tablet (0.1 mg total) by mouth daily. 07/08/19   Sharion Balloon, FNP  clotrimazole (MYCELEX) 10 MG troche Take 1 tablet (10 mg total) by mouth 5 (five) times daily. 08/07/19   Sharion Balloon, FNP  Dulaglutide (TRULICITY) 1.5 RJ/1.8AC SOPN INJECT 1.5MG   SUBCUTANEOUSLY ONCE A WEEK 07/08/19   Hawks, Alyse Low A, FNP  insulin degludec (TRESIBA FLEXTOUCH) 100 UNIT/ML FlexTouch Pen Inject 0.15 mLs (15 Units total) into the skin daily. 07/08/19   Evelina Dun A, FNP  isosorbide mononitrate (IMDUR) 60 MG 24 hr tablet Take 1 tablet (60 mg total) by mouth daily. 07/08/19   Sharion Balloon, FNP  metoCLOPramide (REGLAN) 5 MG tablet Take 1 tablet (  5 mg total) by mouth 4 (four) times daily -  before meals and at bedtime. 12/24/19   Rolland Porter, MD  metoprolol succinate (TOPROL-XL) 25 MG 24 hr tablet Take 1 tablet (25 mg total) by mouth daily. 07/08/19   Evelina Dun A, FNP  nystatin (MYCOSTATIN) 100000 UNIT/ML suspension Take 5 mLs (500,000 Units total) by mouth 4 (four) times daily. 08/07/19   Evelina Dun A, FNP  ondansetron (ZOFRAN) 4 MG tablet Take 1 tablet (4 mg total) by mouth every 8 (eight) hours as needed for nausea or vomiting. 11/11/19   Claretta Fraise, MD  predniSONE (DELTASONE) 5 MG tablet Take 1 tablet (5 mg total) by mouth daily with breakfast. 07/22/19   Barton Dubois, MD  PROGRAF 0.5 MG capsule Take 0.5 mg by mouth daily. Take 0.5mg  by mouth each morning. 10/24/16   [provider]  rosuvastatin (CRESTOR) 20 MG tablet Take 1 tablet by mouth once daily 09/15/19   Evelina Dun A, FNP  senna (SENOKOT) 8.6 MG TABS tablet Take 1 tablet (8.6 mg total) by mouth 2 (two) times daily. 05/27/19   Roxan Hockey, MD  sevelamer carbonate (RENVELA) 800 MG tablet Take 1,600-3,200 mg by mouth 5 (five) times daily. Patient takes 4 tablets(3200mg ) 3 times a day with meals and 2 tablets(1600mg ) 2 times a day with snacks 10/02/16   [provider]    Allergies     Other and Tape  Review of Systems   Review of Systems  All other systems reviewed and are negative.   Physical Exam Updated Vital Signs BP 138/63 (BP Location: Right Arm)   Pulse 64   Temp 98.4 F (36.9 C) (Oral)   Resp 14   Ht 5\' 9"  (1.753 m)   Wt 65.8 kg   SpO2 98%   BMI 21.41 kg/m   Physical Exam Vitals and nursing note reviewed.  Constitutional:      Appearance: Normal appearance. She is normal weight.     Comments: Patient appears chronically ill  HENT:     Head: Normocephalic and atraumatic.     Right Ear: External ear normal.     Left Ear: External ear normal.     Mouth/Throat:     Mouth: Mucous membranes are moist.     Pharynx: No oropharyngeal exudate or posterior oropharyngeal erythema.  Eyes:     General: No scleral icterus.    Extraocular Movements: Extraocular movements intact.     Conjunctiva/sclera: Conjunctivae normal.     Pupils: Pupils are equal, round, and reactive to light.  Cardiovascular:     Rate and Rhythm: Normal rate and regular rhythm.     Pulses: Normal pulses.     Heart sounds: Normal heart sounds.  Pulmonary:     Effort: Pulmonary effort is normal. No respiratory distress.     Breath sounds: Normal breath sounds.  Abdominal:     General: Bowel sounds are normal.     Palpations: Abdomen is soft. There is mass.     Tenderness: There is abdominal tenderness.     Comments: Patient has a firm mass felt in her right lower abdomen consistent with her renal graft.  She states it feels the same and it is normally mildly tender.  Skin:    General: Skin is warm and dry.     Comments: Patient skin color is grayish consistent with her renal disease  Neurological:     General: No focal deficit present.     Mental Status: She is alert  and oriented to person, place, and time.     Cranial Nerves: No cranial nerve deficit.  Psychiatric:        Mood and Affect: Mood normal. Affect is flat.        Behavior: Behavior normal.        Thought  Content: Thought content normal.     ED Results / Procedures / Treatments   Labs (all labs ordered are listed, but only abnormal results are displayed) Results for orders placed or performed during the hospital encounter of 12/23/19  Lipase, blood  Result Value Ref Range   Lipase 36 11 - 51 U/L  Comprehensive metabolic panel  Result Value Ref Range   Sodium 135 135 - 145 mmol/L   Potassium 5.7 (H) 3.5 - 5.1 mmol/L   Chloride 95 (L) 98 - 111 mmol/L   CO2 28 22 - 32 mmol/L   Glucose, Bld 273 (H) 70 - 99 mg/dL   BUN 31 (H) 8 - 23 mg/dL   Creatinine, Ser 7.08 (H) 0.44 - 1.00 mg/dL   Calcium 9.0 8.9 - 10.3 mg/dL   Total Protein 7.4 6.5 - 8.1 g/dL   Albumin 3.2 (L) 3.5 - 5.0 g/dL   AST 27 15 - 41 U/L   ALT 22 0 - 44 U/L   Alkaline Phosphatase 81 38 - 126 U/L   Total Bilirubin 0.5 0.3 - 1.2 mg/dL   GFR, Estimated 6 (L) >60 mL/min   Anion gap 12 5 - 15  CBC  Result Value Ref Range   WBC 11.3 (H) 4.0 - 10.5 K/uL   RBC 4.20 3.87 - 5.11 MIL/uL   Hemoglobin 12.1 12.0 - 15.0 g/dL   HCT 40.5 36 - 46 %   MCV 96.4 80.0 - 100.0 fL   MCH 28.8 26.0 - 34.0 pg   MCHC 29.9 (L) 30.0 - 36.0 g/dL   RDW 15.5 11.5 - 15.5 %   Platelets 224 150 - 400 K/uL   nRBC 0.0 0.0 - 0.2 %   Laboratory interpretation all normal except mild leukocytosis, stable chronic renal disease, mild hyperkalemia    EKG None  Radiology No results found.  Procedures Procedures (including critical care time)  Medications Ordered in ED Medications  metoCLOPramide (REGLAN) tablet 10 mg (has no administration in time range)    ED Course  I have reviewed the triage vital signs and the nursing notes.  Pertinent labs & imaging results that were available during my care of the patient were reviewed by me and considered in my medical decision making (see chart for details).    MDM Rules/Calculators/A&P                         Patient presents with early satiety with nausea with eating small amount of food  sometimes vomiting.  She sometimes has pain but not consistently.  She has had diabetes for a long time and sounds like she has had gastroparesis in the past.  We discussed whether she wanted to get a CT tonight and she is agreeable to starting Reglan to see if that will help with her gastroparesis and following up with gastroenterology.  She was given Reglan in the ED, she states she felt like she could take a pill.  She states she saw gastroenterology in the past but thinks it may be was someone in [place], when I look at her notes and filter for specialty there are no gastroenterology notes.  I  reviewed the literature about the dose of Reglan with dialysis patients in she was started on 5 mg 4 times a day.  Review of her chart shows her last CT scan of her abdomen and pelvis was March 11, 2018.  It did show evidence of infection in her renal graft however patient states she has made no urine for at least 6 months.   Final Clinical Impression(s) / ED Diagnoses Final diagnoses:  Nausea  Early satiety    Rx / DC Orders ED Discharge Orders         Ordered    metoCLOPramide (REGLAN) 5 MG tablet  3 times daily before meals & bedtime        12/24/19 0053         Plan discharge  Rolland Porter, MD, Barbette Or, MD 12/24/19 [phone]

## 2019-12-30 ENCOUNTER — Other Ambulatory Visit: Payer: Self-pay | Admitting: Family

## 2019-12-30 DIAGNOSIS — E785 Hyperlipidemia, unspecified: Secondary | ICD-10-CM

## 2019-12-30 DIAGNOSIS — E1169 Type 2 diabetes mellitus with other specified complication: Secondary | ICD-10-CM

## 2020-01-05 ENCOUNTER — Observation Stay (HOSPITAL_COMMUNITY): Payer: Medicare Other

## 2020-01-05 ENCOUNTER — Inpatient Hospital Stay (HOSPITAL_COMMUNITY)
Admission: EM | Admit: 2020-01-05 | Discharge: 2020-01-07 | DRG: 640 | Disposition: A | Discharge status: Home / Self Care | Payer: Medicare Other | Attending: Internal Medicine | Admitting: Internal Medicine

## 2020-01-05 ENCOUNTER — Emergency Department (HOSPITAL_COMMUNITY): Payer: Medicare Other

## 2020-01-05 ENCOUNTER — Other Ambulatory Visit: Payer: Self-pay

## 2020-01-05 ENCOUNTER — Encounter (HOSPITAL_COMMUNITY): Payer: Self-pay

## 2020-01-05 DIAGNOSIS — J810 Acute pulmonary edema: Secondary | ICD-10-CM | POA: Diagnosis not present

## 2020-01-05 DIAGNOSIS — D631 Anemia in chronic kidney disease: Secondary | ICD-10-CM | POA: Diagnosis present

## 2020-01-05 DIAGNOSIS — K3184 Gastroparesis: Secondary | ICD-10-CM | POA: Diagnosis present

## 2020-01-05 DIAGNOSIS — J811 Chronic pulmonary edema: Secondary | ICD-10-CM | POA: Diagnosis present

## 2020-01-05 DIAGNOSIS — Z940 Kidney transplant status: Secondary | ICD-10-CM

## 2020-01-05 DIAGNOSIS — N186 End stage renal disease: Secondary | ICD-10-CM | POA: Diagnosis present

## 2020-01-05 DIAGNOSIS — I342 Nonrheumatic mitral (valve) stenosis: Secondary | ICD-10-CM

## 2020-01-05 DIAGNOSIS — R778 Other specified abnormalities of plasma proteins: Secondary | ICD-10-CM | POA: Diagnosis present

## 2020-01-05 DIAGNOSIS — E8889 Other specified metabolic disorders: Secondary | ICD-10-CM | POA: Diagnosis present

## 2020-01-05 DIAGNOSIS — R0602 Shortness of breath: Secondary | ICD-10-CM

## 2020-01-05 DIAGNOSIS — I2510 Atherosclerotic heart disease of native coronary artery without angina pectoris: Secondary | ICD-10-CM | POA: Diagnosis present

## 2020-01-05 DIAGNOSIS — Z992 Dependence on renal dialysis: Secondary | ICD-10-CM

## 2020-01-05 DIAGNOSIS — R0789 Other chest pain: Secondary | ICD-10-CM | POA: Diagnosis present

## 2020-01-05 DIAGNOSIS — I351 Nonrheumatic aortic (valve) insufficiency: Secondary | ICD-10-CM | POA: Diagnosis not present

## 2020-01-05 DIAGNOSIS — Z87441 Personal history of nephrotic syndrome: Secondary | ICD-10-CM

## 2020-01-05 DIAGNOSIS — R9431 Abnormal electrocardiogram [ECG] [EKG]: Secondary | ICD-10-CM

## 2020-01-05 DIAGNOSIS — I10 Essential (primary) hypertension: Secondary | ICD-10-CM | POA: Diagnosis present

## 2020-01-05 DIAGNOSIS — E875 Hyperkalemia: Secondary | ICD-10-CM | POA: Diagnosis present

## 2020-01-05 DIAGNOSIS — Z20822 Contact with and (suspected) exposure to covid-19: Secondary | ICD-10-CM | POA: Diagnosis present

## 2020-01-05 DIAGNOSIS — I5042 Chronic combined systolic (congestive) and diastolic (congestive) heart failure: Secondary | ICD-10-CM | POA: Diagnosis present

## 2020-01-05 DIAGNOSIS — Z79899 Other long term (current) drug therapy: Secondary | ICD-10-CM

## 2020-01-05 DIAGNOSIS — E785 Hyperlipidemia, unspecified: Secondary | ICD-10-CM | POA: Diagnosis present

## 2020-01-05 DIAGNOSIS — E8779 Other fluid overload: Principal | ICD-10-CM | POA: Diagnosis present

## 2020-01-05 DIAGNOSIS — Z7952 Long term (current) use of systemic steroids: Secondary | ICD-10-CM

## 2020-01-05 DIAGNOSIS — J9601 Acute respiratory failure with hypoxia: Secondary | ICD-10-CM | POA: Diagnosis not present

## 2020-01-05 DIAGNOSIS — I452 Bifascicular block: Secondary | ICD-10-CM | POA: Diagnosis present

## 2020-01-05 DIAGNOSIS — R54 Age-related physical debility: Secondary | ICD-10-CM | POA: Diagnosis present

## 2020-01-05 DIAGNOSIS — Z841 Family history of disorders of kidney and ureter: Secondary | ICD-10-CM

## 2020-01-05 DIAGNOSIS — I132 Hypertensive heart and chronic kidney disease with heart failure and with stage 5 chronic kidney disease, or end stage renal disease: Secondary | ICD-10-CM | POA: Diagnosis present

## 2020-01-05 DIAGNOSIS — E1122 Type 2 diabetes mellitus with diabetic chronic kidney disease: Secondary | ICD-10-CM | POA: Diagnosis present

## 2020-01-05 DIAGNOSIS — I5023 Acute on chronic systolic (congestive) heart failure: Secondary | ICD-10-CM

## 2020-01-05 DIAGNOSIS — Z7982 Long term (current) use of aspirin: Secondary | ICD-10-CM

## 2020-01-05 DIAGNOSIS — I160 Hypertensive urgency: Secondary | ICD-10-CM | POA: Diagnosis present

## 2020-01-05 DIAGNOSIS — Z833 Family history of diabetes mellitus: Secondary | ICD-10-CM

## 2020-01-05 DIAGNOSIS — Z8249 Family history of ischemic heart disease and other diseases of the circulatory system: Secondary | ICD-10-CM

## 2020-01-05 DIAGNOSIS — Z794 Long term (current) use of insulin: Secondary | ICD-10-CM

## 2020-01-05 DIAGNOSIS — I340 Nonrheumatic mitral (valve) insufficiency: Secondary | ICD-10-CM

## 2020-01-05 DIAGNOSIS — I959 Hypotension, unspecified: Secondary | ICD-10-CM | POA: Diagnosis present

## 2020-01-05 DIAGNOSIS — R112 Nausea with vomiting, unspecified: Secondary | ICD-10-CM | POA: Diagnosis not present

## 2020-01-05 DIAGNOSIS — E1143 Type 2 diabetes mellitus with diabetic autonomic (poly)neuropathy: Secondary | ICD-10-CM | POA: Diagnosis present

## 2020-01-05 DIAGNOSIS — I272 Pulmonary hypertension, unspecified: Secondary | ICD-10-CM | POA: Diagnosis present

## 2020-01-05 HISTORY — DX: Disorder of kidney and ureter, unspecified: N28.9

## 2020-01-05 LAB — TROPONIN I (HIGH SENSITIVITY)
Troponin I (High Sensitivity): 40 ng/L — ABNORMAL HIGH (ref <18)
Troponin I (High Sensitivity): 52 ng/L — ABNORMAL HIGH (ref <18)
Troponin I (High Sensitivity): 48 ng/L — ABNORMAL HIGH (ref <18)
Troponin I (High Sensitivity): 41 ng/L — ABNORMAL HIGH (ref <18)

## 2020-01-05 LAB — COMPREHENSIVE METABOLIC PANEL
ALT: 26 U/L (ref 0–44)
AST: 20 U/L (ref 15–41)
Albumin: 3.2 g/dL — ABNORMAL LOW (ref 3.5–5.0)
Alkaline Phosphatase: 90 U/L (ref 38–126)
Anion gap: 14 (ref 5–15)
BUN: 60 mg/dL — ABNORMAL HIGH (ref 8–23)
CO2: 22 mmol/L (ref 22–32)
Calcium: 8.4 mg/dL — ABNORMAL LOW (ref 8.9–10.3)
Chloride: 97 mmol/L — ABNORMAL LOW (ref 98–111)
Creatinine, Ser: 8.86 mg/dL — ABNORMAL HIGH (ref 0.44–1.00)
GFR, Estimated: 5 mL/min — ABNORMAL LOW (ref >60)
Glucose, Bld: 179 mg/dL — ABNORMAL HIGH (ref 70–99)
Potassium: 5.8 mmol/L — ABNORMAL HIGH (ref 3.5–5.1)
Sodium: 133 mmol/L — ABNORMAL LOW (ref 135–145)
Total Bilirubin: 0.5 mg/dL (ref 0.3–1.2)
Total Protein: 7.4 g/dL (ref 6.5–8.1)

## 2020-01-05 LAB — CBC WITH DIFFERENTIAL/PLATELET
Abs Immature Granulocytes: 0.07 10*3/uL (ref 0.00–0.07)
Basophils Absolute: 0.1 10*3/uL (ref 0.0–0.1)
Basophils Relative: 1 %
Eosinophils Absolute: 0.8 10*3/uL — ABNORMAL HIGH (ref 0.0–0.5)
Eosinophils Relative: 5 %
HCT: 42.3 % (ref 36.0–46.0)
Hemoglobin: 12.4 g/dL (ref 12.0–15.0)
Immature Granulocytes: 1 %
Lymphocytes Relative: 8 %
Lymphs Abs: 1.1 10*3/uL (ref 0.7–4.0)
MCH: 29 pg (ref 26.0–34.0)
MCHC: 29.3 g/dL — ABNORMAL LOW (ref 30.0–36.0)
MCV: 99.1 fL (ref 80.0–100.0)
Monocytes Absolute: 0.8 10*3/uL (ref 0.1–1.0)
Monocytes Relative: 5 %
Neutro Abs: 12.2 10*3/uL — ABNORMAL HIGH (ref 1.7–7.7)
Neutrophils Relative %: 80 %
Platelets: 242 10*3/uL (ref 150–400)
RBC: 4.27 MIL/uL (ref 3.87–5.11)
RDW: 15.4 % (ref 11.5–15.5)
WBC: 15 10*3/uL — ABNORMAL HIGH (ref 4.0–10.5)
nRBC: 0 % (ref 0.0–0.2)

## 2020-01-05 LAB — ECHOCARDIOGRAM COMPLETE
Area-P 1/2: 1.75 cm2
Height: 69 in
S' Lateral: 2.3 cm
Single Plane A4C EF: 50.2 %
Weight: 2613.77 oz

## 2020-01-05 LAB — MAGNESIUM: Magnesium: 2.6 mg/dL — ABNORMAL HIGH (ref 1.7–2.4)

## 2020-01-05 LAB — RESPIRATORY PANEL BY RT PCR (FLU A&B, COVID)
Influenza A by PCR: NEGATIVE
Influenza B by PCR: NEGATIVE
SARS Coronavirus 2 by RT PCR: NEGATIVE

## 2020-01-05 LAB — PHOSPHORUS: Phosphorus: 4.6 mg/dL (ref 2.5–4.6)

## 2020-01-05 LAB — CBG MONITORING, ED: Glucose-Capillary: 138 mg/dL — ABNORMAL HIGH (ref 70–99)

## 2020-01-05 MED ORDER — METOCLOPRAMIDE HCL 10 MG PO TABS
5.0000 mg | ORAL_TABLET | Freq: Three times a day (TID) | ORAL | Status: DC
  Administered 2020-01-05 – 2020-01-07 (×9): 5 mg via ORAL
  Filled 2020-01-05 (×9): qty 1

## 2020-01-05 MED ORDER — HEPARIN SODIUM (PORCINE) 5000 UNIT/ML IJ SOLN
5000.0000 [IU] | Freq: Three times a day (TID) | INTRAMUSCULAR | Status: DC
  Administered 2020-01-05 – 2020-01-07 (×6): 5000 [IU] via SUBCUTANEOUS
  Filled 2020-01-05 (×6): qty 1

## 2020-01-05 MED ORDER — NEPHRO-VITE 0.8 MG PO TABS
1.0000 | ORAL_TABLET | Freq: Every day | ORAL | Status: DC
  Administered 2020-01-05 – 2020-01-07 (×3): 1 via ORAL
  Filled 2020-01-05 (×7): qty 1

## 2020-01-05 MED ORDER — LIDOCAINE HCL (PF) 1 % IJ SOLN: 5.0000 mL | INTRAMUSCULAR | Status: DC | PRN

## 2020-01-05 MED ORDER — CALCIUM GLUCONATE 10 % IV SOLN
1.0000 g | Freq: Once | INTRAVENOUS | Status: AC
  Administered 2020-01-05: 1 g via INTRAVENOUS
  Filled 2020-01-05: qty 10

## 2020-01-05 MED ORDER — ISOSORBIDE MONONITRATE ER 60 MG PO TB24
60.0000 mg | ORAL_TABLET | Freq: Every day | ORAL | Status: DC
  Administered 2020-01-05 – 2020-01-07 (×3): 60 mg via ORAL
  Filled 2020-01-05 (×5): qty 1

## 2020-01-05 MED ORDER — DOXERCALCIFEROL 4 MCG/2ML IV SOLN
2.5000 ug | INTRAVENOUS | Status: DC
  Administered 2020-01-05 – 2020-01-07 (×2): 2.5 ug via INTRAVENOUS
  Filled 2020-01-05 (×3): qty 2

## 2020-01-05 MED ORDER — PROCHLORPERAZINE EDISYLATE 10 MG/2ML IJ SOLN
10.0000 mg | Freq: Four times a day (QID) | INTRAMUSCULAR | Status: DC | PRN
  Administered 2020-01-05 – 2020-01-06 (×3): 10 mg via INTRAVENOUS
  Filled 2020-01-05 (×3): qty 2

## 2020-01-05 MED ORDER — PENTAFLUOROPROP-TETRAFLUOROETH EX AERO: 1.0000 "application " | INHALATION_SPRAY | CUTANEOUS | Status: DC | PRN

## 2020-01-05 MED ORDER — METOPROLOL SUCCINATE ER 25 MG PO TB24
25.0000 mg | ORAL_TABLET | Freq: Every day | ORAL | Status: DC
  Administered 2020-01-05 – 2020-01-07 (×3): 25 mg via ORAL
  Filled 2020-01-05 (×3): qty 1

## 2020-01-05 MED ORDER — ACETAMINOPHEN 500 MG PO TABS: 500.0000 mg | ORAL_TABLET | Freq: Four times a day (QID) | ORAL | Status: DC | PRN

## 2020-01-05 MED ORDER — SODIUM CHLORIDE 0.9 % IV SOLN: 100.0000 mL | INTRAVENOUS | Status: DC | PRN

## 2020-01-05 MED ORDER — CHLORHEXIDINE GLUCONATE CLOTH 2 % EX PADS
6.0000 | MEDICATED_PAD | Freq: Every day | CUTANEOUS | Status: DC
  Administered 2020-01-07: 6 via TOPICAL

## 2020-01-05 MED ORDER — LABETALOL HCL 5 MG/ML IV SOLN
5.0000 mg | Freq: Once | INTRAVENOUS | Status: AC
  Administered 2020-01-05: 5 mg via INTRAVENOUS
  Filled 2020-01-05: qty 4

## 2020-01-05 MED ORDER — SENNA 8.6 MG PO TABS
1.0000 | ORAL_TABLET | Freq: Two times a day (BID) | ORAL | Status: DC
  Administered 2020-01-05 – 2020-01-06 (×3): 8.6 mg via ORAL
  Filled 2020-01-05 (×7): qty 1

## 2020-01-05 MED ORDER — ALBUMIN HUMAN 25 % IV SOLN
12.5000 g | Freq: Once | INTRAVENOUS | Status: AC
  Administered 2020-01-05: 12.5 g via INTRAVENOUS
  Filled 2020-01-05: qty 50

## 2020-01-05 MED ORDER — ROSUVASTATIN CALCIUM 20 MG PO TABS
20.0000 mg | ORAL_TABLET | Freq: Every day | ORAL | Status: DC
  Administered 2020-01-05 – 2020-01-07 (×3): 20 mg via ORAL
  Filled 2020-01-05 (×5): qty 1

## 2020-01-05 MED ORDER — TRAMADOL HCL 50 MG PO TABS
50.0000 mg | ORAL_TABLET | Freq: Two times a day (BID) | ORAL | Status: DC | PRN
  Administered 2020-01-06 – 2020-01-07 (×2): 50 mg via ORAL
  Filled 2020-01-05 (×2): qty 1

## 2020-01-05 MED ORDER — ACETAMINOPHEN 325 MG PO TABS
650.0000 mg | ORAL_TABLET | Freq: Four times a day (QID) | ORAL | Status: DC | PRN
  Administered 2020-01-05 – 2020-01-06 (×3): 650 mg via ORAL
  Filled 2020-01-05 (×3): qty 2

## 2020-01-05 MED ORDER — ASPIRIN EC 81 MG PO TBEC
81.0000 mg | DELAYED_RELEASE_TABLET | Freq: Every day | ORAL | Status: DC
  Administered 2020-01-06 – 2020-01-07 (×2): 81 mg via ORAL
  Filled 2020-01-05 (×2): qty 1

## 2020-01-05 MED ORDER — ONDANSETRON HCL 4 MG/2ML IJ SOLN
4.0000 mg | Freq: Once | INTRAMUSCULAR | Status: AC
  Administered 2020-01-05: 4 mg via INTRAVENOUS
  Filled 2020-01-05: qty 2

## 2020-01-05 MED ORDER — LIDOCAINE-PRILOCAINE 2.5-2.5 % EX CREA: 1.0000 "application " | TOPICAL_CREAM | CUTANEOUS | Status: DC | PRN

## 2020-01-05 MED ORDER — TACROLIMUS 0.5 MG PO CAPS
0.5000 mg | ORAL_CAPSULE | Freq: Every day | ORAL | Status: DC
  Administered 2020-01-05 – 2020-01-07 (×3): 0.5 mg via ORAL
  Filled 2020-01-05 (×5): qty 1

## 2020-01-05 MED ORDER — CLONIDINE HCL 0.1 MG PO TABS
0.1000 mg | ORAL_TABLET | Freq: Every day | ORAL | Status: DC
  Administered 2020-01-06 – 2020-01-07 (×2): 0.1 mg via ORAL
  Filled 2020-01-05 (×2): qty 1

## 2020-01-05 MED ORDER — PREDNISONE 5 MG PO TABS
5.0000 mg | ORAL_TABLET | Freq: Every day | ORAL | Status: DC
  Administered 2020-01-06 – 2020-01-07 (×2): 5 mg via ORAL
  Filled 2020-01-05 (×2): qty 1

## 2020-01-05 MED ORDER — HEPARIN SODIUM (PORCINE) 1000 UNIT/ML DIALYSIS: 20.0000 [IU]/kg | INTRAMUSCULAR | Status: DC | PRN

## 2020-01-05 MED ORDER — METOCLOPRAMIDE HCL 5 MG/ML IJ SOLN
10.0000 mg | Freq: Once | INTRAMUSCULAR | Status: AC
  Administered 2020-01-05: 10 mg via INTRAVENOUS
  Filled 2020-01-05: qty 2

## 2020-01-05 NOTE — Procedures (Signed)
    HEMODIALYSIS TREATMENT NOTE:  3.75-hour low-heparin HD completed via left forearm AVF (16g/antegrade).   Albumin 12.5g given for pressure support x1.  C/o leg cramps for last 30 minutes of HD.  Requested UF interruption and saline bolus.  Requested to end session 15 minutes early for cramping.  Signed AMA waiver.  Net UF 3.3 liters.  Post-HD 141/54, saturating 91-92% on RA, c/o feeling tired and hungry and wanting "to go home and lie down."  [name], RN

## 2020-01-05 NOTE — ED Triage Notes (Signed)
Pt reports vomiting and sob since last night.  Reports last dialysis treatment was Friday.  Pt due for dialysis today.  Says was told to come to ED because she was vomiting.

## 2020-01-05 NOTE — H&P (Signed)
History and Physical    CRISTINA CENICEROS YQM:[id number] DOB: May 04, 1955 DOA: 01/05/2020  PCP: Sharion Balloon, FNP   Patient coming from: Home  I have personally briefly reviewed patient's old medical records in [place]  Chief Complaint: Shortness of breath and hypoxia.  HPI: [name] is a 64 y.o. female with medical history significant of chronic systolic heart failure, type 2 diabetes with nephropathy, end-stage renal disease, hypertension, hyperlipidemia, history of renal transplant; presented to the hospital secondary to increased shortness of breath and hypoxia.  Patient expressed symptoms started yesterday night and has continued progressing.  Symptom has been associated with episode of nausea and vomiting (patient expressed intermittently having nausea and vomiting).  No hematemesis, no dysuria, no hematuria, no chest pain, no fever/chills, no productive cough, no focal weakness, no sick contacts.  She is normally on hemodialysis Monday, Wednesday and Friday.  Covid test negative; patient is vaccinated  ED Course: Abnormal EKG, flat elevation of troponins and positive hypoxia.  Chest x-ray demonstrating pulmonary edema and blood work with positive hyperkalemia.  Nephrology was consulted for dialysis, cardiology consulted for further evaluation and management and the concerns of potential ACS.  TRH has been called to place patient in the hospital to facilitate evaluation and treatment.  Review of Systems: As per HPI otherwise all other systems reviewed and are negative.   Past Medical History:  Diagnosis Date  . Anemia    of chronic disease  . Blood transfusion without reported diagnosis   . Cardiovascular disease    nonobstructive  . Carotid artery stenosis 2008  . CHF (congestive heart failure) ([place])   . Coronary artery disease   . Diabetes mellitus   . Dyslipidemia   . Edema of lower extremity    with hypo-albuminemia and profound protenuria  . Heart murmur     . Hypertension   . Mitral regurgitation   . Nephrotic syndrome   . Patent foramen ovale   . Pulmonary hypertension, moderate to severe ([place])   . Pulmonary nodule   . Tricuspid regurgitation   . Volume depletion, renal, due to output loss (renal deficit)     Past Surgical History:  Procedure Laterality Date  . A/V FISTULAGRAM N/A 04/05/2017   Procedure: A/V FISTULAGRAM - Left Arm;  Surgeon: Angelia Mould, MD;  Location: [facility];  Service: Cardiovascular;  Laterality: N/A;  . A/V FISTULAGRAM Left 09/18/2017   Procedure: A/V FISTULAGRAM;  Surgeon: Serafina Mitchell, MD;  Location: [facility];  Service: Cardiovascular;  Laterality: Left;  . A/V SHUNT INTERVENTION Left 04/05/2017   Procedure: A/V SHUNT INTERVENTION;  Surgeon: Angelia Mould, MD;  Location: [facility];  Service: Cardiovascular;  Laterality: Left;  . CARDIAC CATHETERIZATION  2008  . IR REMOVAL TUN CV CATH W/O FL  11/02/2016  . 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 PERIPHERAL VASCULAR BALLOON ANGIOPLASTY Left 09/18/2017   Procedure: PERIPHERAL VASCULAR BALLOON ANGIOPLASTY;  Surgeon: Serafina Mitchell, MD;  Location: [facility];  Service: Cardiovascular;  Laterality: Left;  Arm Fistula    Social History  reports that she has never smoked. She has never used smokeless tobacco. She reports that she does not drink alcohol and does not use drugs.  Allergies  Allergen Reactions  . Other Other (See Comments)    Tape Bruises and tears skin, Paper tape tolerated  . Tape Other (See Comments)    Bruises and tears skin Paper tape tolerated Bruises and  tears skin Paper tape tolerated Bruises and tears skin Paper tape tolerated    Family History  Problem Relation Age of Onset  . Kidney disease Mother   . Diabetes Mother   . Diabetes Father   . Heart disease Father     Prior to Admission medications   Medication Sig Start Date End Date Taking? Authorizing Provider   acetaminophen (TYLENOL) 500 MG tablet Take 500 mg by mouth every 6 (six) hours as needed for moderate pain or headache.  04/06/16  Yes [provider]  aspirin (ASPIR-LOW) 81 MG EC tablet Take 1 tablet (81 mg total) by mouth daily with breakfast. 05/27/19  Yes Emokpae, Courage, MD  B Complex-C-Folic Acid (RENA-VITE RX) 1 MG TABS Take 1 tablet by mouth daily. 09/24/18  Yes [provider]  cloNIDine (CATAPRES) 0.1 MG tablet Take 1 tablet (0.1 mg total) by mouth daily. 07/08/19  Yes Hawks, Theador Hawthorne, FNP  Dulaglutide (TRULICITY) 1.5 DJ/2.4QA SOPN INJECT 1.5MG   SUBCUTANEOUSLY ONCE A WEEK 07/08/19  Yes Hawks, Christy A, FNP  insulin degludec (TRESIBA FLEXTOUCH) 100 UNIT/ML FlexTouch Pen Inject 0.15 mLs (15 Units total) into the skin daily. 07/08/19  Yes Hawks, Christy A, FNP  metoCLOPramide (REGLAN) 5 MG tablet Take 1 tablet (5 mg total) by mouth 4 (four) times daily -  before meals and at bedtime. 12/24/19  Yes Rolland Porter, MD  metoprolol succinate (TOPROL-XL) 25 MG 24 hr tablet Take 1 tablet (25 mg total) by mouth daily. 07/08/19  Yes Hawks, Christy A, FNP  ondansetron (ZOFRAN) 4 MG tablet Take 1 tablet (4 mg total) by mouth every 8 (eight) hours as needed for nausea or vomiting. 11/11/19  Yes Stacks, Cletus Gash, MD  predniSONE (DELTASONE) 5 MG tablet Take 1 tablet (5 mg total) by mouth daily with breakfast. 07/22/19  Yes Barton Dubois, MD  PROGRAF 0.5 MG capsule Take 0.5 mg by mouth daily.  10/24/16  Yes [provider]  rosuvastatin (CRESTOR) 20 MG tablet Take 1 tablet by mouth once daily 12/31/19  Yes Hawks, Christy A, FNP  senna (SENOKOT) 8.6 MG TABS tablet Take 1 tablet (8.6 mg total) by mouth 2 (two) times daily. 05/27/19  Yes Roxan Hockey, MD  sevelamer carbonate (RENVELA) 800 MG tablet Take 1,600-3,200 mg by mouth 5 (five) times daily. Patient takes 4 tablets(3200mg ) 3 times a day with meals and 2 tablets(1600mg ) 2 times a day with snacks 10/02/16  Yes [provider]   clotrimazole (MYCELEX) 10 MG troche Take 1 tablet (10 mg total) by mouth 5 (five) times daily. 08/07/19   Evelina Dun A, FNP  isosorbide mononitrate (IMDUR) 60 MG 24 hr tablet Take 1 tablet (60 mg total) by mouth daily. Patient not taking: Reported on 01/05/2020 07/08/19   Evelina Dun A, FNP  nystatin (MYCOSTATIN) 100000 UNIT/ML suspension Take 5 mLs (500,000 Units total) by mouth 4 (four) times daily. Patient not taking: Reported on 01/05/2020 08/07/19   Sharion Balloon, FNP    Physical Exam: Vitals:   01/05/20 1615 01/05/20 1630 01/05/20 1645 01/05/20 1715  BP: 124/66 (!) 142/54 (!) 141/54 (!) 153/73  Pulse: 62  70 69  Resp: 18 16 16 18   Temp:      TempSrc:      SpO2: 100% 92% 100% 99%  Weight:      Height:        Constitutional: No chest pain, in no acute distress currently.  Reports an intermittent nausea feelings. Vitals:   01/05/20 1615 01/05/20 1630 01/05/20  1645 01/05/20 1715  BP: 124/66 (!) 142/54 (!) 141/54 (!) 153/73  Pulse: 62  70 69  Resp: 18 16 16 18   Temp:      TempSrc:      SpO2: 100% 92% 100% 99%  Weight:      Height:       Eyes: PERRL, lids and conjunctivae normal, no icterus. ENMT: Mucous membranes are moist. Posterior pharynx clear of any exudate or lesions. Neck: normal, supple, no masses, no thyromegaly no JVD. Respiratory: No using accessory muscles; positive tachypnea appreciated on arrival.  Decreased breath sounds at the bases, normal respiratory effort. Cardiovascular: Regular rate and breathing; no rubs, no gallops, no carotid bruits. Abdomen: no tenderness, no masses palpated. No hepatosplenomegaly. Bowel sounds positive.  Musculoskeletal: no clubbing / cyanosis. No joint deformity upper and lower extremities. Good ROM, no contractures. Normal muscle tone.  Skin: no petechiae. Neurologic: CN 2-12 grossly intact. Sensation intact, DTR normal. Strength 4/5 in all 4 due to poor effort and deconditioning.Marland Kitchen  Psychiatric: Normal judgment and insight.  Alert and oriented x 3. Normal mood.    Labs on Admission: I have personally reviewed following labs and imaging studies  CBC: Recent Labs  Lab 01/05/20 0906  WBC 15.0*  NEUTROABS 12.2*  HGB 12.4  HCT 42.3  MCV 99.1  PLT 017    Basic Metabolic Panel: Recent Labs  Lab 01/05/20 0906  NA 133*  K 5.8*  CL 97*  CO2 22  GLUCOSE 179*  BUN 60*  CREATININE 8.86*  CALCIUM 8.4*  MG 2.6*  PHOS 4.6    GFR: Estimated Creatinine Clearance: 6.6 mL/min (A) (by C-G formula based on SCr of 8.86 mg/dL (H)).  Liver Function Tests: Recent Labs  Lab 01/05/20 0906  AST 20  ALT 26  ALKPHOS 90  BILITOT 0.5  PROT 7.4  ALBUMIN 3.2*    Urine analysis:    Component Value Date/Time   COLORURINE YELLOW 12/02/2018 0622   APPEARANCEUR Cloudy (A) 01/23/2019 1200   LABSPEC 1.013 12/02/2018 0622   PHURINE 7.0 12/02/2018 0622   GLUCOSEU Trace (A) 01/23/2019 1200   HGBUR SMALL (A) 12/02/2018 0622   BILIRUBINUR Negative 01/23/2019 1200   KETONESUR NEGATIVE 12/02/2018 0622   PROTEINUR 2+ (A) 01/23/2019 1200   PROTEINUR 100 (A) 12/02/2018 0622   NITRITE Negative 01/23/2019 1200   NITRITE NEGATIVE 12/02/2018 0622   LEUKOCYTESUR 3+ (A) 01/23/2019 1200   LEUKOCYTESUR LARGE (A) 12/02/2018 0622    Radiological Exams on Admission: DG Chest Portable 1 View  Result Date: 01/05/2020 CLINICAL DATA:  Shortness of breath EXAM: PORTABLE CHEST 1 VIEW COMPARISON:  09/02/2019 FINDINGS: Cardiomegaly and diffuse interstitial opacity with Kerley lines. There may be trace pleural fluid. No pneumothorax. Prominent main pulmonary artery contour, stable. IMPRESSION: Cardiomegaly and pulmonary edema. Electronically Signed   By: Monte Fantasia M.D.   On: 01/05/2020 08:29   ECHOCARDIOGRAM COMPLETE  Result Date: 01/05/2020    ECHOCARDIOGRAM REPORT   Patient Name:   [name] Date of Exam: 01/05/2020 Medical Rec #:  [id number]      Height:       69.0 in Accession #:    [id number]     Weight:       163.4 lb  Date of Birth:  20-Mar-1955      BSA:          1.896 m Patient Age:    74 years       BP:  98/49 mmHg Patient Gender: F              HR:           68 bpm. Exam Location:  [facility] Procedure: 2D Echo, Cardiac Doppler and Color Doppler Indications:    R06.02 SOB  History:        Patient has prior history of Echocardiogram examinations, most                 recent 12/27/2018. CHF, Aortic Valve Disease,                 Signs/Symptoms:Dyspnea and Shortness of Breath; Risk                 Factors:Diabetes and Dyslipidemia. Pulmonary edema. ESRD.  Sonographer:    Roseanna Rainbow RDCS (AE) Referring Phys: [id number] Alphonse Guild BRANCH  Sonographer Comments: Exam ended due to patient nausea and dry heaves. IMPRESSIONS  1. Left ventricular ejection fraction, by estimation, is 55 to 60%. The left ventricle has normal function. The left ventricle has no regional wall motion abnormalities. There is severe left ventricular hypertrophy. Left ventricular diastolic parameters  are consistent with Grade I diastolic dysfunction (impaired relaxation).  2. Right ventricular systolic function is normal. The right ventricular size is normal.  3. Left atrial size was moderately dilated.  4. The mitral valve is normal in structure. Mild mitral valve regurgitation. Mild mitral stenosis.  5. The aortic valve is tricuspid. There is moderate calcification of the aortic valve. There is moderate thickening of the aortic valve. Aortic valve regurgitation is mild. FINDINGS  Left Ventricle: Left ventricular ejection fraction, by estimation, is 55 to 60%. The left ventricle has normal function. The left ventricle has no regional wall motion abnormalities. The left ventricular internal cavity size was normal in size. There is  severe left ventricular hypertrophy. Left ventricular diastolic parameters are consistent with Grade I diastolic dysfunction (impaired relaxation). Indeterminate filling pressures. Right Ventricle: The right ventricular size is  normal. No increase in right ventricular wall thickness. Right ventricular systolic function is normal. Left Atrium: Left atrial size was moderately dilated. Right Atrium: Right atrial size was normal in size. Pericardium: There is no evidence of pericardial effusion. Mitral Valve: The mitral valve is normal in structure. There is mild thickening of the mitral valve leaflet(s). There is mild calcification of the mitral valve leaflet(s). Mild mitral annular calcification. Mild mitral valve regurgitation. Mild mitral valve stenosis. MV peak gradient, 10.2 mmHg. The mean mitral valve gradient is 3.0 mmHg. Tricuspid Valve: The tricuspid valve is normal in structure. Tricuspid valve regurgitation is not demonstrated. No evidence of tricuspid stenosis. Aortic Valve: The aortic valve is tricuspid. There is moderate calcification of the aortic valve. There is moderate thickening of the aortic valve. There is moderate aortic valve annular calcification. Aortic valve regurgitation is mild. Pulmonic Valve: The pulmonic valve was not well visualized. Pulmonic valve regurgitation is mild. No evidence of pulmonic stenosis. Aorta: The aortic root is normal in size and structure. Pulmonary Artery: Indeterminant PASP, inadequate TR jet. IAS/Shunts: The interatrial septum was not well visualized.  LEFT VENTRICLE PLAX 2D LVIDd:         3.57 cm     Diastology LVIDs:         2.30 cm     LV e' medial:   4.03 cm/s LV PW:         2.06 cm     LV E/e' medial: 24.6 LV IVS:  2.00 cm LVOT diam:     1.90 cm LV SV:         48 LV SV Index:   26 LVOT Area:     2.84 cm  LV Volumes (MOD) LV vol d, MOD A4C: 56.2 ml LV vol s, MOD A4C: 28.0 ml LV SV MOD A4C:     56.2 ml LEFT ATRIUM           Index LA diam:      4.50 cm 2.37 cm/m LA Vol (A4C): 73.7 ml 38.88 ml/m  AORTIC VALVE LVOT Vmax:   88.05 cm/s LVOT Vmean:  53.200 cm/s LVOT VTI:    0.171 m  AORTA Ao Root diam: 3.10 cm Ao Asc diam:  3.50 cm MITRAL VALVE MV Area (PHT): 1.75 cm     SHUNTS MV  Peak grad:  10.2 mmHg    Systemic VTI:  0.17 m MV Mean grad:  3.0 mmHg     Systemic Diam: 1.90 cm MV Vmax:       1.60 m/s MV Vmean:      82.8 cm/s MV Decel Time: 433 msec MV E velocity: 99.20 cm/s MV A velocity: 146.00 cm/s MV E/A ratio:  0.68 Carlyle Dolly MD Electronically signed by Carlyle Dolly MD Signature Date/Time: 01/05/2020/3:51:29 PM    Final     EKG: Independently reviewed.  Nonspecific T wave inversion and flattening; sinus rhythm.  Assessment/Plan 1-acute respiratory failure with hypoxia in the setting of pulmonary edema and acute on chronic systolic heart failure. -Follow low-sodium diet, daily weights and strict I's and O's -Hemodialysis for volume management has been ordered -Wean oxygen supplementation to room air and assess for tolerance/improvement in symptoms. -Follow 2D echo results -Follow troponin trend -Cardiology and nephrology service has been consulted and will follow their assessment and recommendations.  2-acute on chronic systolic heart failure -Last ejection fraction in the 40% range -Repeat echo has been ordered -Continue telemetry monitoring -Follow daily weight and stress and O's. -Continue metoprolol -Volume management with hemodialysis.  3-essential hypertension resume home antihypertensive agents -Heart healthy diet has been ordered.  4-hyperlipidemia -Continue statin  5-end-stage renal disease/prior history of renal transplant -Hemodialysis as per nephrology service -Continue Prograf and chronic dose of prednisone.  6-elevated troponin/abnormal EKG -Follow echo -Follow cardiology service recommendation -Continue aspirin, metoprolol, nitrates and statin -Patient denies chest pain currently.  7-type 2 diabetes mellitus with nephropathy and gastroparesis -Continue Reglan -Sliding scale insulin and long-acting insulin will be continued -Modified carbohydrate diet has been ordered -Follow CBGs. -Patient with ESRD chronically on dialysis;  as mentioned above will continue dialysis treatment.  8-hyperkalemia -Calcium gluconate given -Patient received hemodialysis will follow electrolytes trend.   DVT prophylaxis: Heparin. Code Status:   Full code. Family Communication:  No family members at bedside. Disposition Plan:   Patient is from:  Home  Anticipated DC to:  Home  Anticipated DC date:  01/06/20  Anticipated DC barriers: Complete work-up for ACS rule out; stabilize respiratory status. Consults called:  Nephrology service (Dr. Moshe Cipro); cardiology service (Dr. Harl Bowie). Admission status:  Telemetry bed, length of stay less than 2 midnights; observation status.  Severity of Illness: ;Mild to moderate severity; patient complaining of shortness of breath and found to be hypoxic.  Pulmonary edema appreciated on chest x-ray and fine crackles on examination.  Ended requiring up to 5 L nasal supplementation to keep O2 sat above 92%.  Not on oxygen prior to admission.  Dialysis while in the ED has been done with significant  improvement in her symptoms and no requiring oxygen supplementation.  Patient with abnormal troponin concern nonspecific changes on her EKG, cardiology recommended overnight admission, 2D echo repeat troponin level.   Barton Dubois MD [facility]  How to contact the [facility] Attending or Consulting provider [place] or covering provider during after hours [place], for this patient?   1. Check the care team in [facility] and look for a) attending/consulting [facility] provider listed and b) the [facility] team listed 2. Log into [email or web address] and use [place] universal password to access. If you do not have the password, please contact the hospital operator. 3. Locate the [facility] provider you are looking for under [facility] and page to a number that you can be directly reached. 4. If you still have difficulty reaching the provider, please page the [facility] (Director on Call) for the Hospitalists listed on amion for  assistance.  01/05/2020, 6:04 PM

## 2020-01-05 NOTE — Progress Notes (Signed)
Full cardiology consult to follow once additional information available. At this time very mild flat troponin in dialysis patient that presented volume overload, some EKG changes of unclear clinical significance. Follow trop trend and echo to see if any additional testing is warranted, follow symptoms with dialysis   Carlyle Dolly MD

## 2020-01-05 NOTE — ED Provider Notes (Signed)
Emergency Department Provider Note   I have reviewed the triage vital signs and the nursing notes.   HISTORY  Chief Complaint Shortness of Breath   HPI [name] is a 64 y.o. female with past medical history reviewed below including CHF and ESRD presents to the emergency department with shortness of breath and vomiting starting last night.  She denies any chest pain or pressure.  She last had dialysis on Friday at [facility].  She is due for dialysis this morning but due to her shortness of breath and vomiting she presents for evaluation.  She is been compliant with her other home medications.  She denies any abdominal or back pain.  No associated diarrhea.  She denies fevers.  No radiation of symptoms or other modifying factors.   Past Medical History:  Diagnosis Date  . Anemia    of chronic disease  . Blood transfusion without reported diagnosis   . Cardiovascular disease    nonobstructive  . Carotid artery stenosis 2008  . CHF (congestive heart failure) ([place])   . Coronary artery disease   . Diabetes mellitus   . Dyslipidemia   . Edema of lower extremity    with hypo-albuminemia and profound protenuria  . Heart murmur   . Hypertension   . Mitral regurgitation   . Nephrotic syndrome   . Patent foramen ovale   . Pulmonary hypertension, moderate to severe ([place])   . Pulmonary nodule   . Tricuspid regurgitation   . Volume depletion, renal, due to output loss (renal deficit)     Patient Active Problem List   Diagnosis Date Noted  . Pulmonary edema 08/10/2019  . Acute on chronic combined systolic and diastolic CHF (congestive heart failure) -EF 40 to 45 % 08/10/2019  . Pneumonia 07/20/2019  . Nausea with vomiting, unspecified 05/25/2019  . Abnormal CXR (chest x-ray) 05/25/2019  . Acute diastolic CHF (congestive heart failure) ([place]) 12/27/2018  . Acute hypoxemic respiratory failure ([place]) 12/16/2018  . Dyspnea 12/15/2018  . Lung collapse 12/15/2018  . HCAP  (healthcare-associated pneumonia)   . Lobar pneumonia ([place]) 12/02/2018  . Acute respiratory failure with hypoxia ([place]) 12/02/2018  . Hydronephrosis   . Abnormal LFTs (liver function tests) 03/11/2018  . Type 2 diabetes mellitus ([place]) 03/11/2018  . Valvular heart disease 03/11/2018  . Chronic kidney disease on chronic dialysis ([place]) 11/13/2017  . Leukocytosis 09/21/2017  . Generalized weakness 09/21/2017  . Hyperlipidemia associated with type 2 diabetes mellitus ([place]) 02/22/2017  . ESRD (end stage renal disease) ([place]) 2016/08/30  . 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 Encounter for aftercare following kidney transplant 05/22/2016  . Chronic diastolic heart failure ([place]) 06/08/2015  . Aortic valve sclerosis 06/08/2015  . Anemia of chronic disease 07/03/2013  . Essential hypertension 12/18/2010    Past Surgical History:  Procedure Laterality Date  . A/V FISTULAGRAM N/A 04/05/2017   Procedure: A/V FISTULAGRAM - Left Arm;  Surgeon: Angelia Mould, MD;  Location: [place] [facility];  Service: Cardiovascular;  Laterality: N/A;  . A/V FISTULAGRAM Left 09/18/2017   Procedure: A/V FISTULAGRAM;  Surgeon: Serafina Mitchell, MD;  Location: [place] [facility];  Service: Cardiovascular;  Laterality: Left;  . A/V SHUNT INTERVENTION Left 04/05/2017   Procedure: A/V SHUNT INTERVENTION;  Surgeon: Angelia Mould, MD;  Location: [place] [facility];  Service: Cardiovascular;  Laterality: Left;  . CARDIAC CATHETERIZATION  2008  . IR REMOVAL TUN CV CATH W/O FL  11/02/2016  .  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 PERIPHERAL VASCULAR BALLOON ANGIOPLASTY Left 09/18/2017   Procedure: PERIPHERAL VASCULAR BALLOON ANGIOPLASTY;  Surgeon: Serafina Mitchell, MD;  Location: [place] [facility];  Service: Cardiovascular;  Laterality: Left;  Arm Fistula    Allergies Other and Tape  Family History  Problem Relation Age of Onset  . Kidney disease Mother   . Diabetes Mother   . Diabetes  Father   . Heart disease Father     Social History Social History   Tobacco Use  . Smoking status: Never Smoker  . Smokeless tobacco: Never Used  Vaping Use  . Vaping Use: Never used  Substance Use Topics  . Alcohol use: No  . Drug use: No    Review of Systems  Constitutional: No fever/chills Eyes: No visual changes. ENT: No sore throat. Cardiovascular: Denies chest pain. Respiratory: Positive shortness of breath. Gastrointestinal: No abdominal pain. Positive nausea and vomiting.  No diarrhea.  No constipation. Genitourinary: Negative for dysuria. Musculoskeletal: Negative for back pain. Skin: Negative for rash. Neurological: Negative for headaches, focal weakness or numbness.  10-point ROS otherwise negative.  ____________________________________________   PHYSICAL EXAM:  VITAL SIGNS: ED Triage Vitals  Enc Vitals Group     BP 01/05/20 0801 (!) 239/68     Pulse Rate 01/05/20 0801 70     Resp 01/05/20 0801 (!) 22     Temp 01/05/20 0801 99.2 F (37.3 C)     Temp Source 01/05/20 0801 Oral     SpO2 01/05/20 0801 99 %     Weight 01/05/20 0759 140 lb (63.5 kg)     Height 01/05/20 0759 5\' 9"  (1.753 m)   Constitutional: Alert and oriented. Appears slightly dyspneic but able to provide a history. Does not appear confused.  Eyes: Conjunctivae are normal. Head: Atraumatic. Nose: No congestion/rhinnorhea. Mouth/Throat: Mucous membranes are moist.  Neck: No stridor.   Cardiovascular: Normal rate, regular rhythm. Good peripheral circulation. Grossly normal heart sounds.   Respiratory: Increased respiratory effort.  No retractions. Lungs with crackles at the bases. No wheezing.  Gastrointestinal: Soft and nontender. No distention.  Musculoskeletal: No gross deformities of extremities. Neurologic:  Normal speech and language. No gross focal neurologic deficits are appreciated.  Skin:  Skin is warm, dry and intact. No rash  noted.   ____________________________________________   LABS (all labs ordered are listed, but only abnormal results are displayed)  Labs Reviewed  MAGNESIUM - Abnormal; Notable for the following components:      Result Value   Magnesium 2.6 (*)    All other components within normal limits  CBC WITH DIFFERENTIAL/PLATELET - Abnormal; Notable for the following components:   WBC 15.0 (*)    MCHC 29.3 (*)    Neutro Abs 12.2 (*)    Eosinophils Absolute 0.8 (*)    All other components within normal limits  COMPREHENSIVE METABOLIC PANEL - Abnormal; Notable for the following components:   Sodium 133 (*)    Potassium 5.8 (*)    Chloride 97 (*)    Glucose, Bld 179 (*)    BUN 60 (*)    Creatinine, Ser 8.86 (*)    Calcium 8.4 (*)    Albumin 3.2 (*)    GFR, Estimated 5 (*)    All other components within normal limits  CBG MONITORING, ED - Abnormal; Notable for the following components:   Glucose-Capillary 138 (*)    All other components within normal limits  TROPONIN I (HIGH SENSITIVITY) - Abnormal; Notable for  the following components:   Troponin I (High Sensitivity) 40 (*)    All other components within normal limits  TROPONIN I (HIGH SENSITIVITY) - Abnormal; Notable for the following components:   Troponin I (High Sensitivity) 52 (*)    All other components within normal limits  TROPONIN I (HIGH SENSITIVITY) - Abnormal; Notable for the following components:   Troponin I (High Sensitivity) 48 (*)    All other components within normal limits  TROPONIN I (HIGH SENSITIVITY) - Abnormal; Notable for the following components:   Troponin I (High Sensitivity) 41 (*)    All other components within normal limits  RESPIRATORY PANEL BY RT PCR (FLU A&B, COVID)  PHOSPHORUS  CBC WITH DIFFERENTIAL/PLATELET  CBC WITH DIFFERENTIAL/PLATELET   ____________________________________________  EKG   EKG Interpretation  Date/Time:  Monday January 05 2020 09:23:30 EDT Ventricular Rate:  77 PR  Interval:  154 QRS Duration: 109 QT Interval:  403 QTC Calculation: 457 R Axis:   -95 Text Interpretation: Sinus rhythm Left anterior fascicular block Repol abnrm suggests ischemia, lateral leads Minimal ST elevation, lateral leads No STEMI Confirmed by Nanda Quinton [phone]) on 01/05/2020 9:53:33 AM       ____________________________________________  RADIOLOGY  ECHOCARDIOGRAM COMPLETE  Result Date: 01/05/2020    ECHOCARDIOGRAM REPORT   Patient Name:   [name] Date of Exam: 01/05/2020 Medical Rec #:  [id number]      Height:       69.0 in Accession #:    [id number]     Weight:       163.4 lb Date of Birth:  1955/11/16      BSA:          1.896 m Patient Age:    4 years       BP:           98/49 mmHg Patient Gender: F              HR:           68 bpm. Exam Location:  [facility] Procedure: 2D Echo, Cardiac Doppler and Color Doppler Indications:    R06.02 SOB  History:        Patient has prior history of Echocardiogram examinations, most                 recent 12/27/2018. CHF, Aortic Valve Disease,                 Signs/Symptoms:Dyspnea and Shortness of Breath; Risk                 Factors:Diabetes and Dyslipidemia. Pulmonary edema. ESRD.  Sonographer:    Roseanna Rainbow RDCS (AE) Referring Phys: [id number] Alphonse Guild BRANCH  Sonographer Comments: Exam ended due to patient nausea and dry heaves. IMPRESSIONS  1. Left ventricular ejection fraction, by estimation, is 55 to 60%. The left ventricle has normal function. The left ventricle has no regional wall motion abnormalities. There is severe left ventricular hypertrophy. Left ventricular diastolic parameters  are consistent with Grade I diastolic dysfunction (impaired relaxation).  2. Right ventricular systolic function is normal. The right ventricular size is normal.  3. Left atrial size was moderately dilated.  4. The mitral valve is normal in structure. Mild mitral valve regurgitation. Mild mitral stenosis.  5. The aortic valve is tricuspid. There is  moderate calcification of the aortic valve. There is moderate thickening of the aortic valve. Aortic valve regurgitation is mild. FINDINGS  Left Ventricle: Left ventricular ejection fraction, by  estimation, is 55 to 60%. The left ventricle has normal function. The left ventricle has no regional wall motion abnormalities. The left ventricular internal cavity size was normal in size. There is  severe left ventricular hypertrophy. Left ventricular diastolic parameters are consistent with Grade I diastolic dysfunction (impaired relaxation). Indeterminate filling pressures. Right Ventricle: The right ventricular size is normal. No increase in right ventricular wall thickness. Right ventricular systolic function is normal. Left Atrium: Left atrial size was moderately dilated. Right Atrium: Right atrial size was normal in size. Pericardium: There is no evidence of pericardial effusion. Mitral Valve: The mitral valve is normal in structure. There is mild thickening of the mitral valve leaflet(s). There is mild calcification of the mitral valve leaflet(s). Mild mitral annular calcification. Mild mitral valve regurgitation. Mild mitral valve stenosis. MV peak gradient, 10.2 mmHg. The mean mitral valve gradient is 3.0 mmHg. Tricuspid Valve: The tricuspid valve is normal in structure. Tricuspid valve regurgitation is not demonstrated. No evidence of tricuspid stenosis. Aortic Valve: The aortic valve is tricuspid. There is moderate calcification of the aortic valve. There is moderate thickening of the aortic valve. There is moderate aortic valve annular calcification. Aortic valve regurgitation is mild. Pulmonic Valve: The pulmonic valve was not well visualized. Pulmonic valve regurgitation is mild. No evidence of pulmonic stenosis. Aorta: The aortic root is normal in size and structure. Pulmonary Artery: Indeterminant PASP, inadequate TR jet. IAS/Shunts: The interatrial septum was not well visualized.  LEFT VENTRICLE PLAX 2D  LVIDd:         3.57 cm     Diastology LVIDs:         2.30 cm     LV e' medial:   4.03 cm/s LV PW:         2.06 cm     LV E/e' medial: 24.6 LV IVS:        2.00 cm LVOT diam:     1.90 cm LV SV:         48 LV SV Index:   26 LVOT Area:     2.84 cm  LV Volumes (MOD) LV vol d, MOD A4C: 56.2 ml LV vol s, MOD A4C: 28.0 ml LV SV MOD A4C:     56.2 ml LEFT ATRIUM           Index LA diam:      4.50 cm 2.37 cm/m LA Vol (A4C): 73.7 ml 38.88 ml/m  AORTIC VALVE LVOT Vmax:   88.05 cm/s LVOT Vmean:  53.200 cm/s LVOT VTI:    0.171 m  AORTA Ao Root diam: 3.10 cm Ao Asc diam:  3.50 cm MITRAL VALVE MV Area (PHT): 1.75 cm     SHUNTS MV Peak grad:  10.2 mmHg    Systemic VTI:  0.17 m MV Mean grad:  3.0 mmHg     Systemic Diam: 1.90 cm MV Vmax:       1.60 m/s MV Vmean:      82.8 cm/s MV Decel Time: 433 msec MV E velocity: 99.20 cm/s MV A velocity: 146.00 cm/s MV E/A ratio:  0.68 Carlyle Dolly MD Electronically signed by Carlyle Dolly MD Signature Date/Time: 01/05/2020/3:51:29 PM    Final     ____________________________________________   PROCEDURES  Procedure(s) performed:   Procedures  CRITICAL CARE Performed by: Margette Fast Total critical care time: 45 minutes Critical care time was exclusive of separately billable procedures and treating other patients. Critical care was necessary to treat or prevent imminent or life-threatening deterioration.  Critical care was time spent personally by me on the following activities: development of treatment plan with patient and/or surrogate as well as nursing, discussions with consultants, evaluation of patient's response to treatment, examination of patient, obtaining history from patient or surrogate, ordering and performing treatments and interventions, ordering and review of laboratory studies, ordering and review of radiographic studies, pulse oximetry and re-evaluation of patient's condition.  Nanda Quinton, MD Emergency  Medicine  ____________________________________________   INITIAL IMPRESSION / ASSESSMENT AND PLAN / ED COURSE  Pertinent labs & imaging results that were available during my care of the patient were reviewed by me and considered in my medical decision making (see chart for details).   Patient presents emergency department with shortness of breath nausea vomiting.  She is very hypertensive on arrival and chest x-ray shows pulmonary edema.  Labs are pending but discussed with nephrology regarding urgent HD.  Patient currently on 4 L nasal cannula.  She is maintaining well here.  Considered BiPAP but she has had some vomiting and so would not be a good candidate for this.  She does not require intubation at this time.  I was able to secure peripheral IV access using ultrasound.   09:50 AM  Patient's chemistry resulting showing potassium of 5.8.  I have ordered calcium and will add Lokelma when her nausea is improved. She is going to be getting HD this AM as well. EKG with nonspecific ST changes and T wave inversions. Considered ACS but patient is without any CP.   Spoke with Nephrology who will arrange ED HD.   Discussed patient's case with TRH to request admission. Patient and family (if present) updated with plan. Care transferred to [facility] service.  I reviewed all nursing notes, vitals, pertinent old records, EKGs, labs, imaging (as available).  After [facility] discussion, spoke with Dr. Harl Bowie who will evaluate chart and provide recommendations.   ____________________________________________  FINAL CLINICAL IMPRESSION(S) / ED DIAGNOSES  Final diagnoses:  Acute respiratory failure with hypoxia (HCC)  Hyperkalemia  Non-intractable vomiting with nausea, unspecified vomiting type     MEDICATIONS GIVEN DURING THIS VISIT:  Medications  Chlorhexidine Gluconate Cloth 2 % PADS 6 each (6 each Topical Not Given 01/06/20 0532)  pentafluoroprop-tetrafluoroeth (GEBAUERS) aerosol 1 application (has no  administration in time range)  lidocaine (PF) (XYLOCAINE) 1 % injection 5 mL (has no administration in time range)  lidocaine-prilocaine (EMLA) cream 1 application (has no administration in time range)  0.9 %  sodium chloride infusion (has no administration in time range)  0.9 %  sodium chloride infusion (has no administration in time range)  heparin injection 1,300 Units (has no administration in time range)  doxercalciferol (HECTOROL) injection 2.5 mcg (2.5 mcg Intravenous Given 01/05/20 1521)  heparin injection 5,000 Units (5,000 Units Subcutaneous Given 01/06/20 0531)  acetaminophen (TYLENOL) tablet 650 mg (650 mg Oral Given 01/05/20 2141)  prochlorperazine (COMPAZINE) injection 10 mg (10 mg Intravenous Given 01/06/20 0350)  aspirin EC tablet 81 mg (has no administration in time range)  b complex-vitamin c-folic acid (NEPHRO-VITE) tablet 1 tablet (1 tablet Oral Given 01/05/20 1720)  cloNIDine (CATAPRES) tablet 0.1 mg (has no administration in time range)  isosorbide mononitrate (IMDUR) 24 hr tablet 60 mg (60 mg Oral Given 01/05/20 1719)  metoprolol succinate (TOPROL-XL) 24 hr tablet 25 mg (25 mg Oral Given 01/05/20 1719)  metoCLOPramide (REGLAN) tablet 5 mg (5 mg Oral Given 01/05/20 2141)  predniSONE (DELTASONE) tablet 5 mg (has no administration in time range)  tacrolimus (PROGRAF)  capsule 0.5 mg (0.5 mg Oral Given 01/05/20 1719)  rosuvastatin (CRESTOR) tablet 20 mg (20 mg Oral Given 01/05/20 1719)  senna (SENOKOT) tablet 8.6 mg (8.6 mg Oral Given 01/05/20 2141)  traMADol (ULTRAM) tablet 50 mg (has no administration in time range)  ondansetron (ZOFRAN) injection 4 mg (4 mg Intravenous Given 01/05/20 0919)  labetalol (NORMODYNE) injection 5 mg (5 mg Intravenous Given 01/05/20 0923)  calcium gluconate inj 10% (1 g) URGENT USE ONLY! (1 g Intravenous Given 01/05/20 1024)  metoCLOPramide (REGLAN) injection 10 mg (10 mg Intravenous Given 01/05/20 1123)  albumin human 25 % solution 12.5 g (0 g Intravenous  Stopped 01/05/20 1440)    Note:  This document was prepared using Dragon voice recognition software and may include unintentional dictation errors.  Nanda Quinton, MD, [facility] Emergency Medicine    Corneisha Alvi, [facility], MD 01/06/20 [phone]

## 2020-01-05 NOTE — Consult Note (Signed)
Reason for Consult: To manage dialysis and dialysis related needs Referring Physician: [name] is an 64 y.o. female with past medical history significant for DM, HTN, hyperlipidemia, pulmonary HTN, CAD but also ESRD -  MWF at [facility].  She was told to come to the ER with c/o vomiting and SOB since last night- CXR shows pulmonary edema and she is hypoxic- labs have not returned yet but feeling like she will need HD sooner rather than later so I was called to provide HD. She said that this has happened to her before on a Monday- had a Halloween party so may have over done in this weekend-  Has ended HD under her EDW on occasion, ? Weight loss ? - comes in under EDW today    Dialyzes at [facility]- MWF- 4 hours   EDW 69. HD Bath 2/2.5, Dialyzer unknown, Heparin 2000 load and then 600 per hour. Access left AVF- 16 gauge needles- 300 BFR/600 DFR.  meds include hect 2.5 tiw, epo 7400 tiw and vnofer 50 per week   Past Medical History:  Diagnosis Date  . Anemia    of chronic disease  . Blood transfusion without reported diagnosis   . Cardiovascular disease    nonobstructive  . Carotid artery stenosis 2008  . CHF (congestive heart failure) ([place])   . Coronary artery disease   . Diabetes mellitus   . Dyslipidemia   . Edema of lower extremity    with hypo-albuminemia and profound protenuria  . Heart murmur   . Hypertension   . Mitral regurgitation   . Nephrotic syndrome   . Patent foramen ovale   . Pulmonary hypertension, moderate to severe ([place])   . Pulmonary nodule   . Tricuspid regurgitation   . Volume depletion, renal, due to output loss (renal deficit)     Past Surgical History:  Procedure Laterality Date  . A/V FISTULAGRAM N/A 04/05/2017   Procedure: A/V FISTULAGRAM - Left Arm;  Surgeon: Angelia Mould, MD;  Location: [place] [facility];  Service: Cardiovascular;  Laterality: N/A;  . A/V FISTULAGRAM Left 09/18/2017   Procedure: A/V FISTULAGRAM;  Surgeon:  Serafina Mitchell, MD;  Location: [place] [facility];  Service: Cardiovascular;  Laterality: Left;  . A/V SHUNT INTERVENTION Left 04/05/2017   Procedure: A/V SHUNT INTERVENTION;  Surgeon: Angelia Mould, MD;  Location: [place] [facility];  Service: Cardiovascular;  Laterality: Left;  . CARDIAC CATHETERIZATION  2008  . IR REMOVAL TUN CV CATH W/O FL  11/02/2016  . 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 PERIPHERAL VASCULAR BALLOON ANGIOPLASTY Left 09/18/2017   Procedure: PERIPHERAL VASCULAR BALLOON ANGIOPLASTY;  Surgeon: Serafina Mitchell, MD;  Location: [place] [facility];  Service: Cardiovascular;  Laterality: Left;  Arm Fistula    Family History  Problem Relation Age of Onset  . Kidney disease Mother   . Diabetes Mother   . Diabetes Father   . Heart disease Father     Social History:  reports that she has never smoked. She has never used smokeless tobacco. She reports that she does not drink alcohol and does not use drugs.  Allergies:  Allergies  Allergen Reactions  . Other Other (See Comments)    Tape Bruises and tears skin, Paper tape tolerated  . Tape Other (See Comments)    Bruises and tears skin Paper tape tolerated Bruises and tears skin Paper tape tolerated Bruises and tears skin Paper tape tolerated    Medications:  I have reviewed the patient's current medications.   Results for orders placed or performed during the hospital encounter of 01/05/20 (from the past 48 hour(s))  CBC with Differential/Platelet     Status: Abnormal   Collection Time: 01/05/20  9:06 AM  Result Value Ref Range   WBC 15.0 (H) 4.0 - 10.5 K/uL   RBC 4.27 3.87 - 5.11 MIL/uL   Hemoglobin 12.4 12.0 - 15.0 g/dL   HCT 42.3 36 - 46 %   MCV 99.1 80.0 - 100.0 fL   MCH 29.0 26.0 - 34.0 pg   MCHC 29.3 (L) 30.0 - 36.0 g/dL   RDW 15.4 11.5 - 15.5 %   Platelets 242 150 - 400 K/uL   nRBC 0.0 0.0 - 0.2 %   Neutrophils Relative % 80 %   Neutro Abs 12.2 (H) 1.7 - 7.7 K/uL   Lymphocytes Relative 8 %    Lymphs Abs 1.1 0.7 - 4.0 K/uL   Monocytes Relative 5 %   Monocytes Absolute 0.8 0.1 - 1.0 K/uL   Eosinophils Relative 5 %   Eosinophils Absolute 0.8 (H) 0.0 - 0.5 K/uL   Basophils Relative 1 %   Basophils Absolute 0.1 0.0 - 0.1 K/uL   Immature Granulocytes 1 %   Abs Immature Granulocytes 0.07 0.00 - 0.07 K/uL    Comment: Performed at [facility], [address], [place], [place] [address]    DG Chest Portable 1 View  Result Date: 01/05/2020 CLINICAL DATA:  Shortness of breath EXAM: PORTABLE CHEST 1 VIEW COMPARISON:  09/02/2019 FINDINGS: Cardiomegaly and diffuse interstitial opacity with Kerley lines. There may be trace pleural fluid. No pneumothorax. Prominent main pulmonary artery contour, stable. IMPRESSION: Cardiomegaly and pulmonary edema. Electronically Signed   By: Monte Fantasia M.D.   On: 01/05/2020 08:29    ROS: positive for SOB and nausea/vomiting-  She says is not unusual for her Blood pressure (!) 239/68, pulse 70, temperature 99.2 F (37.3 C), temperature source Oral, resp. rate (!) 22, height 5\' 9"  (1.753 m), weight 63.5 kg, SpO2 99 %. General appearance: alert, cooperative and chronically ill Resp: diminished breath sounds bilaterally Cardio: regular rate and rhythm, S1, S2 normal, no murmur, click, rub or gallop GI: soft, non-tender; bowel sounds normal; no masses,  no organomegaly Extremities: extremities normal, atraumatic, no cyanosis or edema Skin: Skin color, texture, turgor normal. No rashes or lesions or multiple excoriations throughout left lower arm AVF-  positive bruit  Assessment/Plan: [name]- multiple medical issues including ESRD-  Comes in on Monday morning SOB and nauseated 1 SOB- with common things being common-  Is just volume overloaded when due for dialysis on a Monday AM-  But also has elevated WBC.  Will do HD today with generous UF and reassess-  Supportive O2- resp panel pending  2 ESRD: normally MWF at [facility] via AVF- will plan  for HD today 3 Hypertension: UF as able with HD 4. Anemia of ESRD: not an issue right now-  No ESA dose needed 5. Metabolic Bone Disease: continue her hectorol-  If stays will order binder  6. N/V-  Says that this is not unusual for her- supportive care    Louis Meckel 01/05/2020, 9:17 AM

## 2020-01-05 NOTE — Progress Notes (Signed)
  Echocardiogram 2D Echocardiogram has been performed.  [name] 01/05/2020, 3:24 PM

## 2020-01-05 NOTE — ED Notes (Addendum)
Pt c/o of back pain. She requested something stronger than tylenol. MD made aware.

## 2020-01-06 DIAGNOSIS — Z79899 Other long term (current) drug therapy: Secondary | ICD-10-CM | POA: Diagnosis not present

## 2020-01-06 DIAGNOSIS — I132 Hypertensive heart and chronic kidney disease with heart failure and with stage 5 chronic kidney disease, or end stage renal disease: Secondary | ICD-10-CM | POA: Diagnosis present

## 2020-01-06 DIAGNOSIS — I5042 Chronic combined systolic (congestive) and diastolic (congestive) heart failure: Secondary | ICD-10-CM

## 2020-01-06 DIAGNOSIS — I160 Hypertensive urgency: Secondary | ICD-10-CM

## 2020-01-06 DIAGNOSIS — E1122 Type 2 diabetes mellitus with diabetic chronic kidney disease: Secondary | ICD-10-CM | POA: Diagnosis present

## 2020-01-06 DIAGNOSIS — Z940 Kidney transplant status: Secondary | ICD-10-CM | POA: Diagnosis not present

## 2020-01-06 DIAGNOSIS — I5033 Acute on chronic diastolic (congestive) heart failure: Secondary | ICD-10-CM

## 2020-01-06 DIAGNOSIS — Z833 Family history of diabetes mellitus: Secondary | ICD-10-CM | POA: Diagnosis not present

## 2020-01-06 DIAGNOSIS — N186 End stage renal disease: Secondary | ICD-10-CM

## 2020-01-06 DIAGNOSIS — R778 Other specified abnormalities of plasma proteins: Secondary | ICD-10-CM | POA: Diagnosis not present

## 2020-01-06 DIAGNOSIS — Z7982 Long term (current) use of aspirin: Secondary | ICD-10-CM | POA: Diagnosis not present

## 2020-01-06 DIAGNOSIS — E875 Hyperkalemia: Secondary | ICD-10-CM | POA: Diagnosis present

## 2020-01-06 DIAGNOSIS — E785 Hyperlipidemia, unspecified: Secondary | ICD-10-CM | POA: Diagnosis present

## 2020-01-06 DIAGNOSIS — Z8249 Family history of ischemic heart disease and other diseases of the circulatory system: Secondary | ICD-10-CM | POA: Diagnosis not present

## 2020-01-06 DIAGNOSIS — E1143 Type 2 diabetes mellitus with diabetic autonomic (poly)neuropathy: Secondary | ICD-10-CM | POA: Diagnosis present

## 2020-01-06 DIAGNOSIS — J9601 Acute respiratory failure with hypoxia: Secondary | ICD-10-CM | POA: Diagnosis present

## 2020-01-06 DIAGNOSIS — Z841 Family history of disorders of kidney and ureter: Secondary | ICD-10-CM | POA: Diagnosis not present

## 2020-01-06 DIAGNOSIS — R112 Nausea with vomiting, unspecified: Secondary | ICD-10-CM

## 2020-01-06 DIAGNOSIS — I2510 Atherosclerotic heart disease of native coronary artery without angina pectoris: Secondary | ICD-10-CM | POA: Diagnosis present

## 2020-01-06 DIAGNOSIS — Z20822 Contact with and (suspected) exposure to covid-19: Secondary | ICD-10-CM | POA: Diagnosis present

## 2020-01-06 DIAGNOSIS — E8889 Other specified metabolic disorders: Secondary | ICD-10-CM | POA: Diagnosis present

## 2020-01-06 DIAGNOSIS — I272 Pulmonary hypertension, unspecified: Secondary | ICD-10-CM | POA: Diagnosis present

## 2020-01-06 DIAGNOSIS — K3184 Gastroparesis: Secondary | ICD-10-CM | POA: Diagnosis present

## 2020-01-06 DIAGNOSIS — J810 Acute pulmonary edema: Secondary | ICD-10-CM

## 2020-01-06 DIAGNOSIS — R0789 Other chest pain: Secondary | ICD-10-CM | POA: Diagnosis not present

## 2020-01-06 DIAGNOSIS — I452 Bifascicular block: Secondary | ICD-10-CM | POA: Diagnosis present

## 2020-01-06 DIAGNOSIS — E8779 Other fluid overload: Secondary | ICD-10-CM | POA: Diagnosis present

## 2020-01-06 DIAGNOSIS — Z87441 Personal history of nephrotic syndrome: Secondary | ICD-10-CM | POA: Diagnosis not present

## 2020-01-06 LAB — GLUCOSE, CAPILLARY
Glucose-Capillary: 239 mg/dL — ABNORMAL HIGH (ref 70–99)
Glucose-Capillary: 197 mg/dL — ABNORMAL HIGH (ref 70–99)

## 2020-01-06 LAB — HEMOGLOBIN A1C
Hgb A1c MFr Bld: 8.7 % — ABNORMAL HIGH (ref 4.8–5.6)
Mean Plasma Glucose: 202.99 mg/dL

## 2020-01-06 MED ORDER — INSULIN ASPART 100 UNIT/ML ~~LOC~~ SOLN: 0.0000 [IU] | Freq: Every day | SUBCUTANEOUS | Status: DC

## 2020-01-06 MED ORDER — NITROGLYCERIN 0.4 MG SL SUBL: 0.4000 mg | SUBLINGUAL_TABLET | SUBLINGUAL | Status: DC | PRN

## 2020-01-06 MED ORDER — INSULIN ASPART 100 UNIT/ML ~~LOC~~ SOLN
0.0000 [IU] | Freq: Three times a day (TID) | SUBCUTANEOUS | Status: DC
  Administered 2020-01-06: 3 [IU] via SUBCUTANEOUS
  Administered 2020-01-07: 1 [IU] via SUBCUTANEOUS
  Administered 2020-01-07: 3 [IU] via SUBCUTANEOUS
  Administered 2020-01-07: 1 [IU] via SUBCUTANEOUS

## 2020-01-06 MED ORDER — CHLORHEXIDINE GLUCONATE CLOTH 2 % EX PADS: 6.0000 | MEDICATED_PAD | Freq: Every day | CUTANEOUS | Status: DC

## 2020-01-06 NOTE — Progress Notes (Signed)
Subjective:  HD yest afternoon-  Got off 3300-  Cramped and signed off HD a little early AMA.  This AM on RA but still feeling SOB and nauseated but is going to try and eat something- cardiology seeing this AM  Objective Vital signs in last 24 hours: Vitals:   01/05/20 1800 01/05/20 1843 01/05/20 2247 01/06/20 0533  BP: (!) 167/80 (!) 170/72 (!) 144/69 (!) 148/55  Pulse: 70 63 65 (!) 59  Resp: 15 18 18 17   Temp:  98.2 F (36.8 C) 98.3 F (36.8 C) 98 F (36.7 C)  TempSrc:  Oral Oral Oral  SpO2: 98% 94% 97% 97%  Weight:      Height:       Weight change:   Intake/Output Summary (Last 24 hours) at 01/06/2020 0826 Last data filed at 01/05/2020 2311 Gross per 24 hour  Intake 0 ml  Output 3302 ml  Net -3302 ml   Dialyzes at [facility]- MWF- 4 hours   EDW 69. HD Bath 2/2.5, Dialyzer unknown, Heparin 2000 load and then 600 per hour. Access left AVF- 16 gauge needles- 300 BFR/600 DFR.  meds include hect 2.5 tiw, epo 7400 tiw and vnofer 50 per week    Assessment/Plan: [name]- multiple medical issues including ESRD-  Comes in on Monday morning SOB and nauseated 1 SOB- with common things being common-  Is just volume overloaded when due for dialysis on a Monday AM-  But also has elevated WBC.  HD yesterday afternoon with 3.3 liters removed-  This AM on RAbut still c/o SOB 2 ESRD: normally MWF at [facility] via AVF- will plan for HD tomorrow if still here 3 Hypertension: UF as able with HD 4. Anemia of ESRD: not an issue right now-  No ESA dose needed 5. Metabolic Bone Disease: continue her hectorol-  given GI upset I did not order binder  6. N/V-  Says that this is not unusual for her- supportive care  7. History of renal transplant in 12/20.  Still on some low dose prograf and prednisone ?      [name] A Riki Gehring    Labs: Basic Metabolic Panel: Recent Labs  Lab 01/05/20 0906  NA 133*  K 5.8*  CL 97*  CO2 22  GLUCOSE 179*  BUN 60*  CREATININE 8.86*   CALCIUM 8.4*  PHOS 4.6   Liver Function Tests: Recent Labs  Lab 01/05/20 0906  AST 20  ALT 26  ALKPHOS 90  BILITOT 0.5  PROT 7.4  ALBUMIN 3.2*   No results for input(s): LIPASE, AMYLASE in the last 168 hours. No results for input(s): AMMONIA in the last 168 hours. CBC: Recent Labs  Lab 01/05/20 0906  WBC 15.0*  NEUTROABS 12.2*  HGB 12.4  HCT 42.3  MCV 99.1  PLT 242   Cardiac Enzymes: No results for input(s): CKTOTAL, CKMB, CKMBINDEX, TROPONINI in the last 168 hours. CBG: Recent Labs  Lab 01/05/20 1110  GLUCAP 138*    Iron Studies: No results for input(s): IRON, TIBC, TRANSFERRIN, FERRITIN in the last 72 hours. Studies/Results: DG Chest Portable 1 View  Result Date: 01/05/2020 CLINICAL DATA:  Shortness of breath EXAM: PORTABLE CHEST 1 VIEW COMPARISON:  09/02/2019 FINDINGS: Cardiomegaly and diffuse interstitial opacity with Kerley lines. There may be trace pleural fluid. No pneumothorax. Prominent main pulmonary artery contour, stable. IMPRESSION: Cardiomegaly and pulmonary edema. Electronically Signed   By: Monte Fantasia M.D.   On: 01/05/2020 08:29   ECHOCARDIOGRAM COMPLETE  Result Date: 01/05/2020    ECHOCARDIOGRAM REPORT   Patient Name:   [name] Date of Exam: 01/05/2020 Medical Rec #:  [id number]      Height:       69.0 in Accession #:    [id number]     Weight:       163.4 lb Date of Birth:  [birth year]      BSA:          1.896 m Patient Age:    64 years       BP:           98/49 mmHg Patient Gender: F              HR:           68 bpm. Exam Location:  [facility] Procedure: 2D Echo, Cardiac Doppler and Color Doppler Indications:    R06.02 SOB  History:        Patient has prior history of Echocardiogram examinations, most                 recent 12/27/2018. CHF, Aortic Valve Disease,                 Signs/Symptoms:Dyspnea and Shortness of Breath; Risk                 Factors:Diabetes and Dyslipidemia. Pulmonary edema. ESRD.  Sonographer:    Roseanna Rainbow RDCS (AE)  Referring Phys: [id number] Alphonse Guild BRANCH  Sonographer Comments: Exam ended due to patient nausea and dry heaves. IMPRESSIONS  1. Left ventricular ejection fraction, by estimation, is 55 to 60%. The left ventricle has normal function. The left ventricle has no regional wall motion abnormalities. There is severe left ventricular hypertrophy. Left ventricular diastolic parameters  are consistent with Grade I diastolic dysfunction (impaired relaxation).  2. Right ventricular systolic function is normal. The right ventricular size is normal.  3. Left atrial size was moderately dilated.  4. The mitral valve is normal in structure. Mild mitral valve regurgitation. Mild mitral stenosis.  5. The aortic valve is tricuspid. There is moderate calcification of the aortic valve. There is moderate thickening of the aortic valve. Aortic valve regurgitation is mild. FINDINGS  Left Ventricle: Left ventricular ejection fraction, by estimation, is 55 to 60%. The left ventricle has normal function. The left ventricle has no regional wall motion abnormalities. The left ventricular internal cavity size was normal in size. There is  severe left ventricular hypertrophy. Left ventricular diastolic parameters are consistent with Grade I diastolic dysfunction (impaired relaxation). Indeterminate filling pressures. Right Ventricle: The right ventricular size is normal. No increase in right ventricular wall thickness. Right ventricular systolic function is normal. Left Atrium: Left atrial size was moderately dilated. Right Atrium: Right atrial size was normal in size. Pericardium: There is no evidence of pericardial effusion. Mitral Valve: The mitral valve is normal in structure. There is mild thickening of the mitral valve leaflet(s). There is mild calcification of the mitral valve leaflet(s). Mild mitral annular calcification. Mild mitral valve regurgitation. Mild mitral valve stenosis. MV peak gradient, 10.2 mmHg. The mean mitral valve  gradient is 3.0 mmHg. Tricuspid Valve: The tricuspid valve is normal in structure. Tricuspid valve regurgitation is not demonstrated. No evidence of tricuspid stenosis. Aortic Valve: The aortic valve is tricuspid. There is moderate calcification of the aortic valve. There is moderate thickening of the aortic valve. There is moderate aortic valve annular calcification. Aortic valve regurgitation is mild. Pulmonic Valve: The pulmonic  valve was not well visualized. Pulmonic valve regurgitation is mild. No evidence of pulmonic stenosis. Aorta: The aortic root is normal in size and structure. Pulmonary Artery: Indeterminant PASP, inadequate TR jet. IAS/Shunts: The interatrial septum was not well visualized.  LEFT VENTRICLE PLAX 2D LVIDd:         3.57 cm     Diastology LVIDs:         2.30 cm     LV e' medial:   4.03 cm/s LV PW:         2.06 cm     LV E/e' medial: 24.6 LV IVS:        2.00 cm LVOT diam:     1.90 cm LV SV:         48 LV SV Index:   26 LVOT Area:     2.84 cm  LV Volumes (MOD) LV vol d, MOD A4C: 56.2 ml LV vol s, MOD A4C: 28.0 ml LV SV MOD A4C:     56.2 ml LEFT ATRIUM           Index LA diam:      4.50 cm 2.37 cm/m LA Vol (A4C): 73.7 ml 38.88 ml/m  AORTIC VALVE LVOT Vmax:   88.05 cm/s LVOT Vmean:  53.200 cm/s LVOT VTI:    0.171 m  AORTA Ao Root diam: 3.10 cm Ao Asc diam:  3.50 cm MITRAL VALVE MV Area (PHT): 1.75 cm     SHUNTS MV Peak grad:  10.2 mmHg    Systemic VTI:  0.17 m MV Mean grad:  3.0 mmHg     Systemic Diam: 1.90 cm MV Vmax:       1.60 m/s MV Vmean:      82.8 cm/s MV Decel Time: 433 msec MV E velocity: 99.20 cm/s MV A velocity: 146.00 cm/s MV E/A ratio:  0.68 Carlyle Dolly MD Electronically signed by Carlyle Dolly MD Signature Date/Time: 01/05/2020/3:51:29 PM    Final    Medications: Infusions: . sodium chloride    . sodium chloride      Scheduled Medications: . aspirin EC  81 mg Oral Q breakfast  . b complex-vitamin c-folic acid  1 tablet Oral Daily  . Chlorhexidine Gluconate Cloth   6 each Topical Q0600  . cloNIDine  0.1 mg Oral Daily  . doxercalciferol  2.5 mcg Intravenous Q M,W,F-HD  . heparin injection (subcutaneous)  5,000 Units Subcutaneous Q8H  . isosorbide mononitrate  60 mg Oral Daily  . metoCLOPramide  5 mg Oral TID AC & HS  . metoprolol succinate  25 mg Oral Daily  . predniSONE  5 mg Oral Q breakfast  . rosuvastatin  20 mg Oral Daily  . senna  1 tablet Oral BID  . tacrolimus  0.5 mg Oral Daily    have reviewed scheduled and prn medications.  Physical Exam: General: frail, in bed- flat Heart: RRR- has murmer- ? AS Lungs:poor effort, clearer than yest Abdomen: soft, non tender Extremities: no appreciable periph edema Dialysis Access: left AVF positive bruit    01/06/2020,8:26 AM  LOS: 0 days

## 2020-01-06 NOTE — Progress Notes (Signed)
PROGRESS NOTE    [name]  TKZ:[id number] DOB: [birth year] DOA: 01/05/2020 PCP: Sharion Balloon, FNP   Chief Complaint  Patient presents with  . Shortness of Breath    Brief Narrative:  [name] is a 64 y.o. female with medical history significant of chronic systolic heart failure, type 2 diabetes with nephropathy, end-stage renal disease, hypertension, hyperlipidemia, history of renal transplant; presented to the hospital secondary to increased shortness of breath and hypoxia.  Patient expressed symptoms started yesterday night and has continued progressing.  Symptom has been associated with episode of nausea and vomiting (patient expressed intermittently having nausea and vomiting).  No hematemesis, no dysuria, no hematuria, no chest pain, no fever/chills, no productive cough, no focal weakness, no sick contacts.  She is normally on hemodialysis Monday, Wednesday and Friday.  Covid test negative; patient is vaccinated  ED Course: Abnormal EKG, flat elevation of troponins and positive hypoxia.  Chest x-ray demonstrating pulmonary edema and blood work with positive hyperkalemia.  Nephrology was consulted for dialysis, cardiology consulted for further evaluation and management and the concerns of potential ACS.  TRH has been called to place patient in the hospital to facilitate evaluation and treatment.  Assessment & Plan: 1-acute respiratory failure with hypoxia in the setting of pulmonary edema -Patient with prior history of systolic heart failure; repeat echo demonstrated a stabilization of her ejection fraction. -Continue to follow daily weights and strict I's and O's -Volume management with hemodialysis -Next treatment anticipated for 01/07/2020 -Continue low-sodium diet -EDW will be a challenge -Still feeling short of breath, but no oxygen supplementation required at rest. -Troponin has been elevated and per cardiology service recommendation plan will be for stress test  most likely on 01/07/2020.  2-acute on chronic diastolic heart failure -Patient with prior history of systolic heart failure last ejection fraction 4045% -Currently ejection fraction has improved and stabilized on repeat echo. -Continue to follow daily weights, low-sodium diet and strict I's and O's -Continue the use of metoprolol -Volume management by dialysis -Follow cardiology service recommendations.  3-essential hypertension -Continue current antihypertensive regimen -Blood pressure stable.  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  7-end-stage renal disease/prior history of renal transplant. -Dialysis treatment as per nephrology service -Continue chronic use of steroids and Prograf.   DVT prophylaxis: Heparin. Code Status: Full code. Family Communication: No family at bedside. Disposition:   Status is: Inpatient  Dispo: The patient is from: home              Anticipated d/c is to: home              Anticipated d/c date is: 1-2 days.              Patient currently not stable for discharge; still complaining of shortness of breath + intermittent nausea.  After the colectomy cardiology service they would like to pursuit a stress test while inpatient.  Nephrology planning for challenge EDW dialysis on 01/07/2020.    Consultants:   Nephrology  Cardiology   Procedures: 2D echo: 1. Left ventricular ejection fraction, by estimation, is 55 to 60%. The  left ventricle has normal function. The left ventricle has no regional  wall motion abnormalities. There is severe left ventricular hypertrophy.  Left ventricular diastolic parameters  are consistent with Grade I diastolic dysfunction (impaired relaxation).  2. Right ventricular  systolic function is normal. The right  ventricular  size is normal.  3. Left atrial size was moderately dilated.  4. The mitral valve is normal in structure. Mild mitral valve  regurgitation. Mild mitral stenosis.  5. The aortic valve is tricuspid. There is moderate calcification of the  aortic valve. There is moderate thickening of the aortic valve. Aortic  valve regurgitation is mild.    Antimicrobials:  None   Subjective: No chest pain, no focal weakness.  Patient denies vomiting but expressed feeling slightly nauseated.  Feeling slightly short of breath this morning but is no requiring oxygen supplementation at rest.  Objective: Vitals:   01/05/20 2247 01/06/20 0533 01/06/20 0939 01/06/20 1342  BP: (!) 144/69 (!) 148/55 (!) 154/67 (!) 127/50  Pulse: 65 (!) 59 78 63  Resp: 18 17 17 20   Temp: 98.3 F (36.8 C) 98 F (36.7 C)  98 F (36.7 C)  TempSrc: Oral Oral  Oral  SpO2: 97% 97% 96% 100%  Weight:      Height:        Intake/Output Summary (Last 24 hours) at 01/06/2020 1429 Last data filed at 01/05/2020 2311 Gross per 24 hour  Intake 0 ml  Output 3302 ml  Net -3302 ml   Filed Weights   01/05/20 0759 01/05/20 1217  Weight: 63.5 kg 74.1 kg    Examination:  General exam: Afebrile, no chest pain, still feeling slightly short of breath and was using 1-2 L nasal cannula overnight.  Currently on room air at rest.  Reports feeling nauseated. Respiratory system: Normal respiratory effort.  Decreased breath sounds at the bases. Cardiovascular system: S1 & S2 heard, RRR. No JVD, rubs or gallops or clicks. No pedal edema. Gastrointestinal system: Abdomen is nondistended, soft and nontender. No organomegaly or masses felt. Normal bowel sounds heard. Central nervous system: Alert and oriented. No focal neurological deficits. Extremities: No cyanosis or clubbing. Skin: No petechiae. Psychiatry: Judgement and insight appear normal. Mood & affect appropriate.     Data Reviewed: I have personally reviewed  following labs and imaging studies  CBC: Recent Labs  Lab 01/05/20 0906  WBC 15.0*  NEUTROABS 12.2*  HGB 12.4  HCT 42.3  MCV 99.1  PLT 176    Basic Metabolic Panel: Recent Labs  Lab 01/05/20 0906  NA 133*  K 5.8*  CL 97*  CO2 22  GLUCOSE 179*  BUN 60*  CREATININE 8.86*  CALCIUM 8.4*  MG 2.6*  PHOS 4.6    GFR: Estimated Creatinine Clearance: 6.6 mL/min (A) (by C-G formula based on SCr of 8.86 mg/dL (H)).  Liver Function Tests: Recent Labs  Lab 01/05/20 0906  AST 20  ALT 26  ALKPHOS 90  BILITOT 0.5  PROT 7.4  ALBUMIN 3.2*    CBG: Recent Labs  Lab 01/05/20 1110  GLUCAP 138*     Recent Results (from the past 240 hour(s))  Respiratory Panel by RT PCR (Flu A&B, Covid) - Nasopharyngeal Swab     Status: None   Collection Time: 01/05/20  8:03 AM   Specimen: Nasopharyngeal Swab  Result Value Ref Range Status   SARS Coronavirus 2 by RT PCR NEGATIVE NEGATIVE Final    Comment: (NOTE) SARS-CoV-2 target nucleic acids are NOT DETECTED.  The SARS-CoV-2 RNA is generally detectable in upper respiratoy specimens during the acute phase of infection. The lowest concentration of SARS-CoV-2 viral copies this assay can detect is 131 copies/mL. A negative result does not preclude SARS-Cov-2 infection and should not be  used as the sole basis for treatment or other patient management decisions. A negative result may occur with  improper specimen collection/handling, submission of specimen other than nasopharyngeal swab, presence of viral mutation(s) within the areas targeted by this assay, and inadequate number of viral copies (<131 copies/mL). A negative result must be combined with clinical observations, patient history, and epidemiological information. The expected result is Negative.  Fact Sheet for Patients:  PinkCheek.be  Fact Sheet for Healthcare Providers:  GravelBags.it  This test is no t yet  approved or cleared by the [place] FDA and  has been authorized for detection and/or diagnosis of SARS-CoV-2 by FDA under an Emergency Use Authorization (EUA). This EUA will remain  in effect (meaning this test can be used) for the duration of the COVID-19 declaration under Section 564(b)(1) of the Act, 21 U.S.C. section 360bbb-3(b)(1), unless the authorization is terminated or revoked sooner.     Influenza A by PCR NEGATIVE NEGATIVE Final   Influenza B by PCR NEGATIVE NEGATIVE Final    Comment: (NOTE) The Xpert Xpress SARS-CoV-2/FLU/RSV assay is intended as an aid in  the diagnosis of influenza from Nasopharyngeal swab specimens and  should not be used as a sole basis for treatment. Nasal washings and  aspirates are unacceptable for Xpert Xpress SARS-CoV-2/FLU/RSV  testing.  Fact Sheet for Patients: PinkCheek.be  Fact Sheet for Healthcare Providers: GravelBags.it  This test is not yet approved or cleared by the [place] FDA and  has been authorized for detection and/or diagnosis of SARS-CoV-2 by  FDA under an Emergency Use Authorization (EUA). This EUA will remain  in effect (meaning this test can be used) for the duration of the  Covid-19 declaration under Section 564(b)(1) of the Act, 21  U.S.C. section 360bbb-3(b)(1), unless the authorization is  terminated or revoked. Performed at [facility], [address] [place], [place] [address]      Radiology Studies: DG Chest Portable 1 View  Result Date: 01/05/2020 CLINICAL DATA:  Shortness of breath EXAM: PORTABLE CHEST 1 VIEW COMPARISON:  09/02/2019 FINDINGS: Cardiomegaly and diffuse interstitial opacity with Kerley lines. There may be trace pleural fluid. No pneumothorax. Prominent main pulmonary artery contour, stable. IMPRESSION: Cardiomegaly and pulmonary edema. Electronically Signed   By: Monte Fantasia M.D.   On: 01/05/2020 08:29   ECHOCARDIOGRAM  COMPLETE  Result Date: 01/05/2020    ECHOCARDIOGRAM REPORT   Patient Name:   TRYNITI LAATSCH Date of Exam: 01/05/2020 Medical Rec #:  [id number]      Height:       69.0 in Accession #:    [id number]     Weight:       163.4 lb Date of Birth:  04-15-1955      BSA:          1.896 m Patient Age:    14 years       BP:           98/49 mmHg Patient Gender: F              HR:           68 bpm. Exam Location:  [facility] Procedure: 2D Echo, Cardiac Doppler and Color Doppler Indications:    R06.02 SOB  History:        Patient has prior history of Echocardiogram examinations, most                 recent 12/27/2018. CHF, Aortic Valve Disease,  Signs/Symptoms:Dyspnea and Shortness of Breath; Risk                 Factors:Diabetes and Dyslipidemia. Pulmonary edema. ESRD.  Sonographer:    Roseanna Rainbow RDCS (AE) Referring Phys: [id number] Alphonse Guild BRANCH  Sonographer Comments: Exam ended due to patient nausea and dry heaves. IMPRESSIONS  1. Left ventricular ejection fraction, by estimation, is 55 to 60%. The left ventricle has normal function. The left ventricle has no regional wall motion abnormalities. There is severe left ventricular hypertrophy. Left ventricular diastolic parameters  are consistent with Grade I diastolic dysfunction (impaired relaxation).  2. Right ventricular systolic function is normal. The right ventricular size is normal.  3. Left atrial size was moderately dilated.  4. The mitral valve is normal in structure. Mild mitral valve regurgitation. Mild mitral stenosis.  5. The aortic valve is tricuspid. There is moderate calcification of the aortic valve. There is moderate thickening of the aortic valve. Aortic valve regurgitation is mild. FINDINGS  Left Ventricle: Left ventricular ejection fraction, by estimation, is 55 to 60%. The left ventricle has normal function. The left ventricle has no regional wall motion abnormalities. The left ventricular internal cavity size was normal in size. There is   severe left ventricular hypertrophy. Left ventricular diastolic parameters are consistent with Grade I diastolic dysfunction (impaired relaxation). Indeterminate filling pressures. Right Ventricle: The right ventricular size is normal. No increase in right ventricular wall thickness. Right ventricular systolic function is normal. Left Atrium: Left atrial size was moderately dilated. Right Atrium: Right atrial size was normal in size. Pericardium: There is no evidence of pericardial effusion. Mitral Valve: The mitral valve is normal in structure. There is mild thickening of the mitral valve leaflet(s). There is mild calcification of the mitral valve leaflet(s). Mild mitral annular calcification. Mild mitral valve regurgitation. Mild mitral valve stenosis. MV peak gradient, 10.2 mmHg. The mean mitral valve gradient is 3.0 mmHg. Tricuspid Valve: The tricuspid valve is normal in structure. Tricuspid valve regurgitation is not demonstrated. No evidence of tricuspid stenosis. Aortic Valve: The aortic valve is tricuspid. There is moderate calcification of the aortic valve. There is moderate thickening of the aortic valve. There is moderate aortic valve annular calcification. Aortic valve regurgitation is mild. Pulmonic Valve: The pulmonic valve was not well visualized. Pulmonic valve regurgitation is mild. No evidence of pulmonic stenosis. Aorta: The aortic root is normal in size and structure. Pulmonary Artery: Indeterminant PASP, inadequate TR jet. IAS/Shunts: The interatrial septum was not well visualized.  LEFT VENTRICLE PLAX 2D LVIDd:         3.57 cm     Diastology LVIDs:         2.30 cm     LV e' medial:   4.03 cm/s LV PW:         2.06 cm     LV E/e' medial: 24.6 LV IVS:        2.00 cm LVOT diam:     1.90 cm LV SV:         48 LV SV Index:   26 LVOT Area:     2.84 cm  LV Volumes (MOD) LV vol d, MOD A4C: 56.2 ml LV vol s, MOD A4C: 28.0 ml LV SV MOD A4C:     56.2 ml LEFT ATRIUM           Index LA diam:      4.50 cm  2.37 cm/m LA Vol (A4C): 73.7 ml 38.88 ml/m  AORTIC VALVE LVOT  Vmax:   88.05 cm/s LVOT Vmean:  53.200 cm/s LVOT VTI:    0.171 m  AORTA Ao Root diam: 3.10 cm Ao Asc diam:  3.50 cm MITRAL VALVE MV Area (PHT): 1.75 cm     SHUNTS MV Peak grad:  10.2 mmHg    Systemic VTI:  0.17 m MV Mean grad:  3.0 mmHg     Systemic Diam: 1.90 cm MV Vmax:       1.60 m/s MV Vmean:      82.8 cm/s MV Decel Time: 433 msec MV E velocity: 99.20 cm/s MV A velocity: 146.00 cm/s MV E/A ratio:  0.68 Carlyle Dolly MD Electronically signed by Carlyle Dolly MD Signature Date/Time: 01/05/2020/3:51:29 PM    Final     Scheduled Meds: . aspirin EC  81 mg Oral Q breakfast  . b complex-vitamin c-folic acid  1 tablet Oral Daily  . Chlorhexidine Gluconate Cloth  6 each Topical Q0600  . [START ON 01/07/2020] Chlorhexidine Gluconate Cloth  6 each Topical Q0600  . cloNIDine  0.1 mg Oral Daily  . doxercalciferol  2.5 mcg Intravenous Q M,W,F-HD  . heparin injection (subcutaneous)  5,000 Units Subcutaneous Q8H  . isosorbide mononitrate  60 mg Oral Daily  . metoCLOPramide  5 mg Oral TID AC & HS  . metoprolol succinate  25 mg Oral Daily  . predniSONE  5 mg Oral Q breakfast  . rosuvastatin  20 mg Oral Daily  . senna  1 tablet Oral BID  . tacrolimus  0.5 mg Oral Daily   Continuous Infusions: . sodium chloride    . sodium chloride       LOS: 0 days    Time spent: 30 minutes  Barton Dubois, MD [facility]   To contact the attending provider between 7A-7P or the covering provider during after hours 7P-7A, please log into the web site [email or web address] and access using universal [place] password for that web site. If you do not have the password, please call the hospital operator.  01/06/2020, 2:29 PM

## 2020-01-06 NOTE — Consult Note (Addendum)
Cardiology Consult    Patient ID: [name]; [id number]; [birth year]   Admit date: 01/05/2020 Date of Consult: 01/06/2020  Primary Care Provider: Sharion Balloon, FNP Primary Cardiologist: Dr. Doneta Public [facility]   Patient Profile    [name] is a 64 y.o. female with past medical history of chronic combined systolic and diastolic CHF (EF 51-02% by echo in 12/2018), CAD (s/p cath in 2008 showing nonobstructive disease and Callin Ashe vessel disease), HTN, HLD, IDDM and ESRD (s/p renal transplant in 5852 complicated by rejection and on HD- MWF schedule) who is being seen today for the evaluation of CHF and elevated troponin values at the request of Dr. Dyann Kief.   History of Present Illness    Ms. Brabant has been followed by Dr. Doneta Public with [facility] by review of Care Everywhere with her most recent visit being in 10/2019. She was experiencing intermittent issues with hypotension during HD but denied any recent anginal symptoms. Was continued on her current cardiac medications including Amlodipine 10mg  daily, ASA, Clonidine 0.1mg  nightly, Imdur 60mg  daily, Toprol-XL 25mg  daily and Crestor 20mg  daily.   She presented to [facility] ED on 01/05/2020 for evaluation of worsening dyspnea and associated vomiting which has started the night prior to arrival. Says she was in her usual state of health on Saturday and went to the store. Walked with her cane but says breathing was at baseline and denies any associated chest pain with this.   On Sunday night, she developed worsening nausea and vomiting. Was recently started on Reglan and says this has helped with her symptoms but still has breakout episodes. Had associated dyspnea Sunday night and had to sit up in her bed. Reports associated chest pressure which was exacerbated with turning from side to side or taking a deep breath.   Initial labs show WBC 15.0, Hgb 12.4, platelets 242, Na+ 133, K+ 5.8 and creatinine 8.86. Mg 2.6. COVID negative. Initial HS  Troponin 40 with repeat values of 52, 48 and 41. CXR showed caridomegaly and pulmonary edema. EKG shows NSR, HR 72 with RAD and significant TWI along the inferior and lateral leads which is actually similar to prior tracings from 05/2019 and 07/2019 but more prominent when compared to 08/2019.  A repeat echocardiogram was obtained and showed her EF has now improved to 55-60% with no regional WMA. Was noted to have severe LVH and Grade 1 DD. RV function was normal. She did have mild MR and mild mitral stenosis.   Underwent HD yesterday afternoon with UF and 3.3 L removed. Session was terminated early per patient request due to cramping.   Says her breathing has improved this morning but still has nausea. Feels "funny" in her chest but denies any specific pain or pressure.    Past Medical History:  Diagnosis Date  . Anemia    of chronic disease  . Blood transfusion without reported diagnosis   . Cardiovascular disease    nonobstructive  . Carotid artery stenosis 2008  . CHF (congestive heart failure) ([place])   . Coronary artery disease   . Diabetes mellitus   . Dyslipidemia   . Edema of lower extremity    with hypo-albuminemia and profound protenuria  . Heart murmur   . Hypertension   . Mitral regurgitation   . Nephrotic syndrome   . Patent foramen ovale   . Pulmonary hypertension, moderate to severe ([place])   . Pulmonary nodule   . Tricuspid regurgitation   . Volume  depletion, renal, due to output loss (renal deficit)     Past Surgical History:  Procedure Laterality Date  . A/V FISTULAGRAM N/A 04/05/2017   Procedure: A/V FISTULAGRAM - Left Arm;  Surgeon: Angelia Mould, MD;  Location: [place] CV LAB;  Service: Cardiovascular;  Laterality: N/A;  . A/V FISTULAGRAM Left 09/18/2017   Procedure: A/V FISTULAGRAM;  Surgeon: Serafina Mitchell, MD;  Location: [place] CV LAB;  Service: Cardiovascular;  Laterality: Left;  . A/V SHUNT INTERVENTION Left 04/05/2017   Procedure: A/V  SHUNT INTERVENTION;  Surgeon: Angelia Mould, MD;  Location: [place] CV LAB;  Service: Cardiovascular;  Laterality: Left;  . CARDIAC CATHETERIZATION  2008  . IR REMOVAL TUN CV CATH W/O FL  11/02/2016  . 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 PERIPHERAL VASCULAR BALLOON ANGIOPLASTY Left 09/18/2017   Procedure: PERIPHERAL VASCULAR BALLOON ANGIOPLASTY;  Surgeon: Serafina Mitchell, MD;  Location: [place] CV LAB;  Service: Cardiovascular;  Laterality: Left;  Arm Fistula     Home Medications:  Prior to Admission medications   Medication Sig Start Date End Date Taking? Authorizing Provider  acetaminophen (TYLENOL) 500 MG tablet Take 500 mg by mouth every 6 (six) hours as needed for moderate pain or headache.  04/06/16  Yes [provider]  aspirin (ASPIR-LOW) 81 MG EC tablet Take 1 tablet (81 mg total) by mouth daily with breakfast. 05/27/19  Yes Emokpae, Courage, MD  B Complex-C-Folic Acid (RENA-VITE RX) 1 MG TABS Take 1 tablet by mouth daily. 09/24/18  Yes [provider]  cloNIDine (CATAPRES) 0.1 MG tablet Take 1 tablet (0.1 mg total) by mouth daily. 07/08/19  Yes Hawks, Theador Hawthorne, FNP  Dulaglutide (TRULICITY) 1.5 KC/1.2XN SOPN INJECT 1.5MG   SUBCUTANEOUSLY ONCE A WEEK 07/08/19  Yes Hawks, Christy A, FNP  insulin degludec (TRESIBA FLEXTOUCH) 100 UNIT/ML FlexTouch Pen Inject 0.15 mLs (15 Units total) into the skin daily. 07/08/19  Yes Hawks, Christy A, FNP  metoCLOPramide (REGLAN) 5 MG tablet Take 1 tablet (5 mg total) by mouth 4 (four) times daily -  before meals and at bedtime. 12/24/19  Yes Rolland Porter, MD  metoprolol succinate (TOPROL-XL) 25 MG 24 hr tablet Take 1 tablet (25 mg total) by mouth daily. 07/08/19  Yes Hawks, Christy A, FNP  ondansetron (ZOFRAN) 4 MG tablet Take 1 tablet (4 mg total) by mouth every 8 (eight) hours as needed for nausea or vomiting. 11/11/19  Yes Stacks, Cletus Gash, MD  predniSONE (DELTASONE) 5 MG tablet Take 1 tablet (5 mg total) by mouth daily with breakfast.  07/22/19  Yes Barton Dubois, MD  PROGRAF 0.5 MG capsule Take 0.5 mg by mouth daily.  10/24/16  Yes [provider]  rosuvastatin (CRESTOR) 20 MG tablet Take 1 tablet by mouth once daily 12/31/19  Yes Hawks, Christy A, FNP  senna (SENOKOT) 8.6 MG TABS tablet Take 1 tablet (8.6 mg total) by mouth 2 (two) times daily. 05/27/19  Yes Roxan Hockey, MD  sevelamer carbonate (RENVELA) 800 MG tablet Take 1,600-3,200 mg by mouth 5 (five) times daily. Patient takes 4 tablets(3200mg ) 3 times a day with meals and 2 tablets(1600mg ) 2 times a day with snacks 10/02/16  Yes [provider]  clotrimazole (MYCELEX) 10 MG troche Take 1 tablet (10 mg total) by mouth 5 (five) times daily. 08/07/19   Evelina Dun A, FNP  isosorbide mononitrate (IMDUR) 60 MG 24 hr tablet Take 1 tablet (60 mg total) by mouth daily. Patient not taking: Reported on 01/05/2020 07/08/19  Hawks, Christy A, FNP  nystatin (MYCOSTATIN) 100000 UNIT/ML suspension Take 5 mLs (500,000 Units total) by mouth 4 (four) times daily. Patient not taking: Reported on 01/05/2020 08/07/19   Sharion Balloon, FNP    Inpatient Medications: Scheduled Meds: . aspirin EC  81 mg Oral Q breakfast  . b complex-vitamin c-folic acid  1 tablet Oral Daily  . Chlorhexidine Gluconate Cloth  6 each Topical Q0600  . cloNIDine  0.1 mg Oral Daily  . doxercalciferol  2.5 mcg Intravenous Q M,W,F-HD  . heparin injection (subcutaneous)  5,000 Units Subcutaneous Q8H  . isosorbide mononitrate  60 mg Oral Daily  . metoCLOPramide  5 mg Oral TID AC & HS  . metoprolol succinate  25 mg Oral Daily  . predniSONE  5 mg Oral Q breakfast  . rosuvastatin  20 mg Oral Daily  . senna  1 tablet Oral BID  . tacrolimus  0.5 mg Oral Daily   Continuous Infusions: . sodium chloride    . sodium chloride     PRN Meds: sodium chloride, sodium chloride, acetaminophen, heparin, lidocaine (PF), lidocaine-prilocaine, pentafluoroprop-tetrafluoroeth, prochlorperazine,  traMADol  Allergies:    Allergies  Allergen Reactions  . Other Other (See Comments)    Tape Bruises and tears skin, Paper tape tolerated  . Tape Other (See Comments)    Bruises and tears skin Paper tape tolerated Bruises and tears skin Paper tape tolerated Bruises and tears skin Paper tape tolerated    Social History:   Social History   Socioeconomic History  . Marital status: Married    Spouse name: Not on file  . Number of children: Not on file  . Years of education: Not on file  . Highest education level: Not on file  Occupational History  . Not on file  Tobacco Use  . Smoking status: Never Smoker  . Smokeless tobacco: Never Used  Vaping Use  . Vaping Use: Never used  Substance and Sexual Activity  . Alcohol use: No  . Drug use: No  . Sexual activity: Never  Other Topics Concern  . Not on file  Social History Narrative  . Not on file   Social Determinants of Health   Financial Resource Strain:   . Difficulty of Paying Living Expenses: Not on file  Food Insecurity:   . Worried About Charity fundraiser in the Last Year: Not on file  . Ran Out of Food in the Last Year: Not on file  Transportation Needs:   . Lack of Transportation (Medical): Not on file  . Lack of Transportation (Non-Medical): Not on file  Physical Activity:   . Days of Exercise per Week: Not on file  . Minutes of Exercise per Session: Not on file  Stress:   . Feeling of Stress : Not on file  Social Connections:   . Frequency of Communication with Friends and Family: Not on file  . Frequency of Social Gatherings with Friends and Family: Not on file  . Attends Religious Services: Not on file  . Active Member of Clubs or Organizations: Not on file  . Attends Archivist Meetings: Not on file  . Marital Status: Not on file  Intimate Partner Violence:   . Fear of Current or Ex-Partner: Not on file  . Emotionally Abused: Not on file  . Physically Abused: Not on file  . Sexually  Abused: Not on file     Family History:    Family History  Problem Relation Age of Onset  .  Kidney disease Mother   . Diabetes Mother   . Diabetes Father   . Heart disease Father       Review of Systems    General:  No chills, fever, night sweats or weight changes.  Cardiovascular:  No edema, orthopnea, palpitations, paroxysmal nocturnal dyspnea. Positive for chest pain and dyspnea.  Dermatological: No rash, lesions/masses Respiratory: No cough. Urologic: No hematuria, dysuria Abdominal:   No diarrhea, bright red blood per rectum, melena, or hematemesis. Positive for nausea and vomiting.  Neurologic:  No visual changes, wkns, changes in mental status. All other systems reviewed and are otherwise negative except as noted above.  Physical Exam/Data    Vitals:   01/05/20 1800 01/05/20 1843 01/05/20 2247 01/06/20 0533  BP: (!) 167/80 (!) 170/72 (!) 144/69 (!) 148/55  Pulse: 70 63 65 (!) 59  Resp: 15 18 18 17   Temp:  98.2 F (36.8 C) 98.3 F (36.8 C) 98 F (36.7 C)  TempSrc:  Oral Oral Oral  SpO2: 98% 94% 97% 97%  Weight:      Height:        Intake/Output Summary (Last 24 hours) at 01/06/2020 0900 Last data filed at 01/05/2020 2311 Gross per 24 hour  Intake 0 ml  Output 3302 ml  Net -3302 ml   Filed Weights   01/05/20 0759 01/05/20 1217  Weight: 63.5 kg 74.1 kg   Body mass index is 24.12 kg/m.   General: Pleasant female appearing in NAD Psych: Normal affect. Neuro: Alert and oriented X 3. Moves all extremities spontaneously. HEENT: Normal  Neck: Supple without bruits or JVD. Lungs:  Resp regular and unlabored, mild rales along right base. Heart: RRR no s3, s4, or murmurs. Abdomen: Soft, non-tender, non-distended, BS + x 4.  Extremities: No clubbing or cyanosis. Trace lower extremity edema. DP/PT/Radials 2+ and equal bilaterally.   EKG:  The EKG was personally reviewed and demonstrates: NSR, HR 72 with RAD and significant TWI along the inferior and lateral  leads which is actually similar to prior tracings from 05/2019 and 07/2019 but more prominent when compared to 08/2019.  Telemetry:  Telemetry was personally reviewed and demonstrates: NSR, HR in 60's to 70's with no significant arrhythmias.    Labs/Studies     Relevant CV Studies:  Cardiac Catheterization: 2008 Left mainstem is angiographically normal.  It trifurcates into the LAD,  left circumflex and intermediate Rohin Krejci.   The LAD is a large-caliber vessel that courses down to the LV apex.  The  proximal portion of the LAD has a long tubular 40% stenosis.  The mid  and distal LAD are angiographically normal.  There is a first diagonal  Broc Caspers that has diffuse 25-40% stenosis.  There is a distal diagonal  Keenan Dimitrov that is fairly large and has no significant obstructive disease.   The ramus intermedius Soniya Ashraf is large-caliber and has no significant  angiographic disease.   The left circumflex is small.  It provides a proximal atrial Leiliana Foody and  courses down to give off a very small OM Alessio Bogan that has a 90% stenosis.  This is approximately a 1-mm vessel.   The right coronary artery is large.  It is a dominant vessel.  The  proximal mid and distal portions of the right coronary artery are smooth  and have no significant angiographic disease.  The right coronary artery  gives off multiple distal branches.  There is a twin PDA system.  The  second PDA Sreshta Cressler has a 70% stenosis.  There are three large  posterolateral branches that have no significant angiographic disease.   Left ventriculography demonstrates normal left ventricular systolic  function with an estimated LVEF of 60%.  There is no mitral  regurgitation.   ASSESSMENT:  1. Non-obstructive proximal coronary artery disease.  2. Alveena Taira vessel disease involving the diagonal branches of the LAD, a      small OM Sereniti Wan of the left circumflex, and a PD Zyaire Mccleod of the      right coronary artery.  3. Normal left  ventricular function.   PLAN:  I think Ms. Erskin is best served by medical therapy for her  coronary artery disease.  She has significant stenosis involving small  Tacarra Justo vessels, but it is unclear which of these could be the possible  culprit for her chest pain.  I will make any of these bear much  prognostic importance.  I think she should do well with medical  treatment of her coronary artery disease.     Echocardiogram: 12/2018 IMPRESSIONS    1. Left ventricular ejection fraction, by visual estimation, is 40 to  45%. The left ventricle has Low normal to mildly decreased. Recommend  contrast study to better evaluate. function. There is severely increased  left ventricular hypertrophy.  2. Elevated mean left atrial pressure.  3. Left ventricular diastolic Doppler parameters are consistent with  impaired relaxation pattern of LV diastolic filling.  4. The apex is hypokinetic.  5. Global right ventricle has normal systolic function.The right  ventricular size is normal. No increase in right ventricular wall  thickness.  6. Left atrial size was moderately dilated.  7. Right atrial size was normal.  8. Mild aortic valve annular calcification.  9. Mild mitral annular calcification.  10. The mitral valve is abnormal. Mild mitral valve regurgitation. Mild  mitral stenosis.  11. The tricuspid valve is normal in structure. Tricuspid valve  regurgitation is mild.  12. The aortic valve is tricuspid Aortic valve regurgitation is moderate  by color flow Doppler. Mild aortic valve stenosis.  13. There is Mild calcification of the aortic valve.  14. There is Mild thickening of the aortic valve.  15. The pulmonic valve was not well visualized. Pulmonic valve  regurgitation is trivial by color flow Doppler.  16. Normal pulmonary artery systolic pressure.  17. The inferior vena cava is normal in size with greater than 50%  respiratory variability, suggesting right atrial  pressure of 3 mmHg.    Echocardiogram: 01/05/2020 IMPRESSIONS    1. Left ventricular ejection fraction, by estimation, is 55 to 60%. The  left ventricle has normal function. The left ventricle has no regional  wall motion abnormalities. There is severe left ventricular hypertrophy.  Left ventricular diastolic parameters  are consistent with Grade I diastolic dysfunction (impaired relaxation).  2. Right ventricular systolic function is normal. The right ventricular  size is normal.  3. Left atrial size was moderately dilated.  4. The mitral valve is normal in structure. Mild mitral valve  regurgitation. Mild mitral stenosis.  5. The aortic valve is tricuspid. There is moderate calcification of the  aortic valve. There is moderate thickening of the aortic valve. Aortic  valve regurgitation is mild.     Laboratory Data:  Chemistry Recent Labs  Lab 01/05/20 0906  NA 133*  K 5.8*  CL 97*  CO2 22  GLUCOSE 179*  BUN 60*  CREATININE 8.86*  CALCIUM 8.4*  GFRNONAA 5*  ANIONGAP 14    Recent Labs  Lab 01/05/20  0906  PROT 7.4  ALBUMIN 3.2*  AST 20  ALT 26  ALKPHOS 90  BILITOT 0.5   Hematology Recent Labs  Lab 01/05/20 0906  WBC 15.0*  RBC 4.27  HGB 12.4  HCT 42.3  MCV 99.1  MCH 29.0  MCHC 29.3*  RDW 15.4  PLT 242   Cardiac EnzymesNo results for input(s): TROPONINI in the last 168 hours. No results for input(s): TROPIPOC in the last 168 hours.  BNPNo results for input(s): BNP, PROBNP in the last 168 hours.  DDimer No results for input(s): DDIMER in the last 168 hours.  Radiology/Studies:  DG Chest Portable 1 View  Result Date: 01/05/2020 CLINICAL DATA:  Shortness of breath EXAM: PORTABLE CHEST 1 VIEW COMPARISON:  09/02/2019 FINDINGS: Cardiomegaly and diffuse interstitial opacity with Kerley lines. There may be trace pleural fluid. No pneumothorax. Prominent main pulmonary artery contour, stable. IMPRESSION: Cardiomegaly and pulmonary edema. Electronically  Signed   By: Monte Fantasia M.D.   On: 01/05/2020 08:29    Assessment & Plan    1. Chronic Combined Systolic and Diastolic CHF - Her EF was previously reduced at 40-45% by echo in 12/2018. Repeat imaging this admission shows her EF has now improved to 55-60% with no regional WMA. Was noted to have severe LVH and Grade 1 DD. RV function was normal.  - Volume management per HD. She remains on Imdur 60mg  daily and Toprol-XL 25mg  daily. Previously not on an ACE-I/ARB/ARNI due to ESRD and no strong indication to add Hydralazine based off her EF unless needed for BP control.   2. Atypical Chest Pain/Elevated Troponin/Abnormal EKG - Reports episodes of chest pressure the night prior to admission which occurred in the setting of nausea and vomiting. Was worse with inspiration and positional changes. No association with exertion and had ambulated around the grocery store the day prior without anginal symptoms.  -  HS Troponin values have been flat at 40, 52, 48 and 41. EKG shows NSR, HR 72 with RAD and significant TWI along the inferior and lateral leads which is actually similar to prior tracings from 05/2019 and 07/2019 but more prominent when compared to 08/2019. Will ask for a repeat tracing this AM as she was hyperkalemic on admission and underwent HD yesterday.  - Given her multiple cardiac risk factors (HTN, HLD, Type 2 DM, and family history of CAD) and known CAD by prior cath in 2008, she would benefit from ischemic evaluation. Given her current nausea/vomiting, would not pursue a [employer] today. Can perhaps arrange as an outpatient once her acute illness has improved unless changes in clinical status prompt inpatient eval.     3. Hypertensive Urgency - BP initially recorded as 239/68, improved following HD yesterday. She does typically hold her BP medications the morning of HD due to episodes of hypotension. BP improved to 148/55 on most recent check and she has been continued on Clonidine  (only taking once daily dosing so would be concerned about rebound HTN), Toprol-XL and Imdur. Previously on Amlodipine but appears this had been discontinued in the outpatient setting.   4. ESRD - Underwent HD yesterday with 3.3L of fluid removed. Nephrology following.   5. Nausea/Vomiting - Not scheduled to see GI until 02/05/2020. Further management per admitting team but might be worth touching base with GI to see if they have any further recommendations in the interim as she is already on scheduled Reglan.    For questions or updates, please contact [place] Please consult [email or web address]  for contact info under Cardiology/STEMI.  Signed, Erma Heritage, PA-C 01/06/2020, 9:00 AM Pager: [phone]   Attending note Patient seen and discussed with PA Ahmed Prima, I agree with her documentation. 64 yo female ESRD, DM2, HTN, HL, chronic systolic HF LVEF 21-74% admitted with SOB and hypoxia. Has had some N/V at home. In ER evidence of fluid overload and pulmonary edema  Mg 2.6 WBC 15 Hgb 12.4 Plt 242 K 5.8 BUN 60  COVId neg hstrop 40-->52-->49-->41 CXR pulm edema EKG SR, inferior and lateral precordial TWIs, prominent peaked Twaves Echo LVEF 55-60%, no WMAs, grade I diastolic dysfunction    LVEF has normalized, she actually had a study as well at [facility] 01/2019 that showed her LVEF had improved to 55-60% as well. Mild trop overall flat in setting of severe HTN on admission, volume overload, and ESRD is not impressive for ACS. Has some more prominent TWIs on EKG however from review of prior EKGs this has been somewhat variable in the past and consistent with prior tracings, her hyperacute T waves resolved with HD and normalization of K.   Given EKG pattern and risk factors would plan for ischemic testing, if N/V improve and breathing improves with further HD potentially as inpatient. Make npo tonight in case stress testing tomorrow.   Carlyle Dolly MD

## 2020-01-07 ENCOUNTER — Encounter (HOSPITAL_COMMUNITY): Payer: Self-pay | Admitting: Internal Medicine

## 2020-01-07 ENCOUNTER — Inpatient Hospital Stay (HOSPITAL_COMMUNITY): Payer: Medicare Other

## 2020-01-07 DIAGNOSIS — R112 Nausea with vomiting, unspecified: Secondary | ICD-10-CM | POA: Diagnosis not present

## 2020-01-07 DIAGNOSIS — R0789 Other chest pain: Secondary | ICD-10-CM

## 2020-01-07 DIAGNOSIS — E875 Hyperkalemia: Secondary | ICD-10-CM

## 2020-01-07 DIAGNOSIS — J9601 Acute respiratory failure with hypoxia: Secondary | ICD-10-CM

## 2020-01-07 DIAGNOSIS — I5042 Chronic combined systolic (congestive) and diastolic (congestive) heart failure: Secondary | ICD-10-CM

## 2020-01-07 DIAGNOSIS — R778 Other specified abnormalities of plasma proteins: Secondary | ICD-10-CM | POA: Diagnosis not present

## 2020-01-07 DIAGNOSIS — N186 End stage renal disease: Secondary | ICD-10-CM | POA: Diagnosis not present

## 2020-01-07 LAB — NM MYOCAR MULTI W/SPECT W/WALL MOTION / EF
LV dias vol: 86 mL (ref 46–106)
LV sys vol: 40 mL
Peak HR: 70 {beats}/min
RATE: 0.43
Rest HR: 58 {beats}/min
SDS: 3
SRS: 15
SSS: 18
TID: 1.12

## 2020-01-07 LAB — RENAL FUNCTION PANEL
Albumin: 3.3 g/dL — ABNORMAL LOW (ref 3.5–5.0)
Anion gap: 12 (ref 5–15)
BUN: 48 mg/dL — ABNORMAL HIGH (ref 8–23)
CO2: 23 mmol/L (ref 22–32)
Calcium: 8 mg/dL — ABNORMAL LOW (ref 8.9–10.3)
Chloride: 93 mmol/L — ABNORMAL LOW (ref 98–111)
Creatinine, Ser: 8.23 mg/dL — ABNORMAL HIGH (ref 0.44–1.00)
GFR, Estimated: 5 mL/min — ABNORMAL LOW (ref >60)
Glucose, Bld: 193 mg/dL — ABNORMAL HIGH (ref 70–99)
Phosphorus: 6.2 mg/dL — ABNORMAL HIGH (ref 2.5–4.6)
Potassium: 5.9 mmol/L — ABNORMAL HIGH (ref 3.5–5.1)
Sodium: 128 mmol/L — ABNORMAL LOW (ref 135–145)

## 2020-01-07 LAB — CBC
HCT: 35.3 % — ABNORMAL LOW (ref 36.0–46.0)
Hemoglobin: 10.7 g/dL — ABNORMAL LOW (ref 12.0–15.0)
MCH: 29.1 pg (ref 26.0–34.0)
MCHC: 30.3 g/dL (ref 30.0–36.0)
MCV: 95.9 fL (ref 80.0–100.0)
Platelets: 201 10*3/uL (ref 150–400)
RBC: 3.68 MIL/uL — ABNORMAL LOW (ref 3.87–5.11)
RDW: 14.8 % (ref 11.5–15.5)
WBC: 11.6 10*3/uL — ABNORMAL HIGH (ref 4.0–10.5)
nRBC: 0 % (ref 0.0–0.2)

## 2020-01-07 LAB — GLUCOSE, CAPILLARY
Glucose-Capillary: 146 mg/dL — ABNORMAL HIGH (ref 70–99)
Glucose-Capillary: 218 mg/dL — ABNORMAL HIGH (ref 70–99)
Glucose-Capillary: 133 mg/dL — ABNORMAL HIGH (ref 70–99)

## 2020-01-07 MED ORDER — ALBUMIN HUMAN 25 % IV SOLN
12.5000 g | Freq: Once | INTRAVENOUS | Status: AC
  Administered 2020-01-07: 12.5 g via INTRAVENOUS
  Filled 2020-01-07: qty 50

## 2020-01-07 MED ORDER — REGADENOSON 0.4 MG/5ML IV SOLN
INTRAVENOUS | Status: AC
  Administered 2020-01-07: 0.4 mg via INTRAVENOUS
  Filled 2020-01-07: qty 5

## 2020-01-07 MED ORDER — SODIUM CHLORIDE FLUSH 0.9 % IV SOLN
INTRAVENOUS | Status: AC
  Administered 2020-01-07: 10 mL via INTRAVENOUS
  Filled 2020-01-07: qty 10

## 2020-01-07 MED ORDER — HEPARIN SODIUM (PORCINE) 1000 UNIT/ML DIALYSIS: 20.0000 [IU]/kg | INTRAMUSCULAR | Status: DC | PRN

## 2020-01-07 MED ORDER — EPOETIN ALFA 10000 UNIT/ML IJ SOLN
10000.0000 [IU] | Freq: Once | INTRAMUSCULAR | Status: AC
  Administered 2020-01-07: 10000 [IU] via INTRAVENOUS

## 2020-01-07 MED ORDER — TECHNETIUM TC 99M TETROFOSMIN IV KIT
30.0000 | PACK | Freq: Once | INTRAVENOUS | Status: AC | PRN
  Administered 2020-01-07: 33 via INTRAVENOUS

## 2020-01-07 MED ORDER — TECHNETIUM TC 99M TETROFOSMIN IV KIT
10.0000 | PACK | Freq: Once | INTRAVENOUS | Status: AC | PRN
  Administered 2020-01-07: 11 via INTRAVENOUS

## 2020-01-07 NOTE — Procedures (Signed)
   HEMODIALYSIS TREATMENT NOTE:  UF limited by hypotension; took antihypertensives pre-HD.  Albumin 12.5g given x1.  Net UF only 154cc.  Hgb trending down.  On Epo 7400u tiw at outpatient HD center.  Missed Monday's dose.  Epo 10,000u given today.  Was hungry pre-HD, having missed a meal for her stress test earlier today.  Diet order obtained for dinner.  She drank half of a Glucerna and had 4 episodes of loose stool during HD.  Was assessed by Dr. Carles Collet.  "It was that drink.. I think it's passed." Still eager to go home but will eat dinner first to make sure GI symptoms have fully resolved.   4 hour session completed. All blood was returned and hemostasis was achieved in 15 minutes.  Standing BP 120/57, pulse 67.  Weight 68.7kg (EDW 68.5).  No change from pre-dialysis assessment.  [name], RN

## 2020-01-07 NOTE — Progress Notes (Signed)
     Patient's stress test showed :   Non-diagnostic ST/T abnormalities and occasional PACs.  Medium sized, moderate to severe intensity, inferior/inferolateral defect from apex to base that is partially reversible and consistent with scar and moderate mid to apical peri-infarct ischemia.  This is an intermediate risk study.  Nuclear stress EF: 53%.  Reviewed by Dr. Domenic Polite and medical management recommended at this time given her presentation not consistent with ACS and no recurrent symptoms since admission. She should follow-up with Cardiology at [facility] within 1 week and can further discuss potential catheterization versus medical management at that time. Continue ASA 81mg  daily, Crestor 20mg  daily, Toprol-XL and Imdur.   Signed, Erma Heritage, PA-C 01/07/2020, 1:06 PM

## 2020-01-07 NOTE — TOC Initial Note (Signed)
Transition of Care [facility]) - Initial/Assessment Note    Patient Details  Name: [name] MRN: [id number] Date of Birth: [birth year]  Transition of Care [facility]) CM/SW Contact:    Salome Arnt, Brent Phone Number: 01/07/2020, 8:27 AM  Clinical Narrative:  Pt admitted due to acute respiratory failure with hypoxia in the setting of pulmonary edema and acute on chronic systolic heart failure. LCSW completed assessment due to high risk readmission score. Pt reports she lives with her husband and granddaughter. She is independent with personal care, but family assists with cooking and other household tasks. Pt has been on dialysis for about 4 years and goes to [facility] MWF 1st shift. She plans to return home when medically stable. No needs reported at this time. TOC will continue to follow.                  Expected Discharge Plan: Home/Self Care Barriers to Discharge: Continued Medical Work up   Patient Goals and CMS Choice Patient states their goals for this hospitalization and ongoing recovery are:: return home      Expected Discharge Plan and Services Expected Discharge Plan: Home/Self Care In-house Referral: Clinical Social Work   Post Acute Care Choice: NA Living arrangements for the past 2 months: Single Family Home                 DME Arranged: N/A DME Agency: NA       HH Arranged: NA [place] Agency: NA        Prior Living Arrangements/Services Living arrangements for the past 2 months: [place] Lives with:: Spouse, Relatives Patient language and need for interpreter reviewed:: Yes Do you feel safe going back to the place where you live?: Yes      Need for Family Participation in Patient Care: Yes (Comment) Care giver support system in place?: Yes (comment) Current home services: DME (cane, walker, wheelchair, shower chair) Criminal Activity/Legal Involvement Pertinent to Current Situation/Hospitalization: No - Comment as needed  Activities of  Daily Living Home Assistive Devices/Equipment: Environmental consultant (specify type), Cane (specify quad or straight), Eyeglasses ADL Screening (condition at time of admission) Patient's cognitive ability adequate to safely complete daily activities?: Yes Is the patient deaf or have difficulty hearing?: No Does the patient have difficulty seeing, even when wearing glasses/contacts?: No Does the patient have difficulty concentrating, remembering, or making decisions?: No Patient able to express need for assistance with ADLs?: Yes Does the patient have difficulty dressing or bathing?: No Independently performs ADLs?: Yes (appropriate for developmental age) Does the patient have difficulty walking or climbing stairs?: No Weakness of Legs: None Weakness of Arms/Hands: None  Permission Sought/Granted                  Emotional Assessment   Attitude/Demeanor/Rapport: Engaged Affect (typically observed): Accepting Orientation: : Oriented to Self, Oriented to Place, Oriented to  Time, Oriented to Situation Alcohol / Substance Use: Not Applicable Psych Involvement: No (comment)  Admission diagnosis:  Hyperkalemia [E87.5] Pulmonary edema [J81.1] Acute respiratory failure with hypoxia (HCC) [J96.01] Non-intractable vomiting with nausea, unspecified vomiting type [R11.2] Patient Active Problem List   Diagnosis Date Noted  . Pulmonary edema 08/10/2019  . Acute on chronic combined systolic and diastolic CHF (congestive heart failure) -EF 40 to 45 % 08/10/2019  . Pneumonia 07/20/2019  . Nausea with vomiting, unspecified 05/25/2019  . Abnormal CXR (chest x-ray) 05/25/2019  . Acute diastolic CHF (congestive heart failure) ([place]) 12/27/2018  . Acute hypoxemic  respiratory failure ([place]) 12/16/2018  . Dyspnea 12/15/2018  . Lung collapse 12/15/2018  . HCAP (healthcare-associated pneumonia)   . Lobar pneumonia ([place]) 12/02/2018  . Acute respiratory failure with hypoxia ([place]) 12/02/2018  . Hydronephrosis   .  Abnormal LFTs (liver function tests) 03/11/2018  . Type 2 diabetes mellitus ([place]) 03/11/2018  . Valvular heart disease 03/11/2018  . Chronic kidney disease on chronic dialysis ([place]) 11/13/2017  . Leukocytosis 09/21/2017  . Generalized weakness 09/21/2017  . Hyperlipidemia associated with type 2 diabetes mellitus ([place]) 02/22/2017  . ESRD (end stage renal disease) ([place]) 18-Aug-2016  . 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 Encounter for aftercare following kidney transplant 05/22/2016  . Chronic diastolic heart failure ([place]) 06/08/2015  . Aortic valve sclerosis 06/08/2015  . Anemia of chronic disease 07/03/2013  . Essential hypertension 12/18/2010   PCP:  Sharion Balloon, FNP Pharmacy:   [facility] [address] [place] [address] [place] [address] Phone: [phone] Fax: [phone]     Social Determinants of Health (SDOH) Interventions    Readmission Risk Interventions Readmission Risk Prevention Plan 12/19/2018 12/03/2018  Transportation Screening Complete Complete  PCP or Specialist Appt within 3-5 Days Complete Not Complete  HRI or [place] Not Complete Complete  HRI or Home Care Consult comments Cannot find a [place] agency that will take pt's insurance -  Social Work Consult for [place] Planning/Counseling Complete Complete  Palliative Care Screening Not Applicable Not Complete  Medication Review Press photographer) Complete Complete  Some recent data might be hidden

## 2020-01-07 NOTE — Progress Notes (Addendum)
Progress Note  Patient Name: [name] Date of Encounter: 01/07/2020  Primary Cardiologist: Dr. Doneta Public [facility]   Subjective   She denies any recurrent nausea or vomiting. Feels back to baseline this morning. Did ambulate around the room yesterday without recurrent chest pain or dyspnea.   Inpatient Medications    Scheduled Meds: . aspirin EC  81 mg Oral Q breakfast  . b complex-vitamin c-folic acid  1 tablet Oral Daily  . Chlorhexidine Gluconate Cloth  6 each Topical Q0600  . Chlorhexidine Gluconate Cloth  6 each Topical Q0600  . cloNIDine  0.1 mg Oral Daily  . doxercalciferol  2.5 mcg Intravenous Q M,W,F-HD  . heparin injection (subcutaneous)  5,000 Units Subcutaneous Q8H  . insulin aspart  0-5 Units Subcutaneous QHS  . insulin aspart  0-9 Units Subcutaneous TID WC  . isosorbide mononitrate  60 mg Oral Daily  . metoCLOPramide  5 mg Oral TID AC & HS  . metoprolol succinate  25 mg Oral Daily  . predniSONE  5 mg Oral Q breakfast  . rosuvastatin  20 mg Oral Daily  . senna  1 tablet Oral BID  . tacrolimus  0.5 mg Oral Daily   Continuous Infusions: . sodium chloride    . sodium chloride     PRN Meds: sodium chloride, sodium chloride, acetaminophen, heparin, lidocaine (PF), lidocaine-prilocaine, nitroGLYCERIN, pentafluoroprop-tetrafluoroeth, prochlorperazine, traMADol   Vital Signs    Vitals:   01/06/20 1700 01/06/20 2052 01/07/20 0158 01/07/20 0501  BP: 113/60 140/60 (!) 149/60 (!) 144/50  Pulse: 63 (!) 55 60 (!) 59  Resp: 18 17 18    Temp: 98.1 F (36.7 C) 97.7 F (36.5 C) 97.9 F (36.6 C) 97.8 F (36.6 C)  TempSrc:  Oral  Oral  SpO2: 100% 100% 100% 99%  Weight:      Height:        Intake/Output Summary (Last 24 hours) at 01/07/2020 0738 Last data filed at 01/07/2020 0100 Gross per 24 hour  Intake 240 ml  Output 3 ml  Net 237 ml    Last 3 Weights 01/05/2020 01/05/2020 12/23/2019  Weight (lbs) 163 lb 5.8 oz 140 lb 145 lb  Weight (kg) 74.1 kg 63.504  kg 65.772 kg      Telemetry    Sinus bradycardia, HR in 50's to 60's. Occasional PVC's occurring in a couplet at times- Personally Reviewed  ECG    NSR, HR 74 with incomplete RBBB and LVH with TWI along the inferior and lateral leads. - Personally Reviewed  Physical Exam   General: Well developed, well nourished, female appearing in no acute distress. Head: Normocephalic, atraumatic.  Neck: Supple without bruits, JVD not elevated. Lungs:  Resp regular and unlabored, mild rales along right base. Heart: RRR, S1, S2, no S3, S4, 2/6 SEM along RUSB.  Abdomen: Soft, non-tender, non-distended with normoactive bowel sounds. No hepatomegaly. No rebound/guarding. No obvious abdominal masses. Extremities: No clubbing, cyanosis, or edema. Distal pedal pulses are 2+ bilaterally. Neuro: Alert and oriented X 3. Moves all extremities spontaneously. Psych: Normal affect.  Labs    Chemistry Recent Labs  Lab 01/05/20 0906  NA 133*  K 5.8*  CL 97*  CO2 22  GLUCOSE 179*  BUN 60*  CREATININE 8.86*  CALCIUM 8.4*  PROT 7.4  ALBUMIN 3.2*  AST 20  ALT 26  ALKPHOS 90  BILITOT 0.5  GFRNONAA 5*  ANIONGAP 14     Hematology Recent Labs  Lab 01/05/20 0906  WBC 15.0*  RBC 4.27  HGB 12.4  HCT 42.3  MCV 99.1  MCH 29.0  MCHC 29.3*  RDW 15.4  PLT 242    Radiology    DG Chest Portable 1 View  Result Date: 01/05/2020 CLINICAL DATA:  Shortness of breath EXAM: PORTABLE CHEST 1 VIEW COMPARISON:  09/02/2019 FINDINGS: Cardiomegaly and diffuse interstitial opacity with Kerley lines. There may be trace pleural fluid. No pneumothorax. Prominent main pulmonary artery contour, stable. IMPRESSION: Cardiomegaly and pulmonary edema. Electronically Signed   By: Monte Fantasia M.D.   On: 01/05/2020 08:29    Cardiac Studies   Echocardiogram: 01/05/2020 IMPRESSIONS    1. Left ventricular ejection fraction, by estimation, is 55 to 60%. The  left ventricle has normal function. The left ventricle  has no regional  wall motion abnormalities. There is severe left ventricular hypertrophy.  Left ventricular diastolic parameters  are consistent with Grade I diastolic dysfunction (impaired relaxation).  2. Right ventricular systolic function is normal. The right ventricular  size is normal.  3. Left atrial size was moderately dilated.  4. The mitral valve is normal in structure. Mild mitral valve  regurgitation. Mild mitral stenosis.  5. The aortic valve is tricuspid. There is moderate calcification of the  aortic valve. There is moderate thickening of the aortic valve. Aortic  valve regurgitation is mild.   Patient Profile     64 y.o. female w/ PMH of chronic combined systolic and diastolic CHF (EF 46-96% by echo in 12/2018), CAD (s/p cath in 2008 showing nonobstructive disease and branch vessel disease), HTN, HLD, IDDM and ESRD (s/p renal transplant in 2952 complicated by rejection and on HD- MWF schedule) who is currently admitted for evaluation of dyspnea, chest pain, nausea and vomiting.   Assessment & Plan    1. Chronic Combined Systolic and Diastolic CHF - EF was previously reduced at 40-45% by echo in 12/2018 but had improved to 55-60% by imaging in 01/2019 at [facility]. Repeat echo this admission shows her EF remains preserved at 55-60% with no regional WMA.  - Volume management per HD. Remains on Imdur and Toprol-XL. Previously not on ACE-I/ARB/ARNI for her cardiomyopathy given ESRD.    2. Atypical Chest Pain/Elevated Troponin/Abnormal EKG - Presented with episodes of chest pain the night prior to admission which occurred in the setting of nausea and vomiting. Worse with inspiration and positional changes.  - HS Troponin values were flat at 40, 52, 48 and 41 which is most consistent with demand ischemia and not ACS. EKG shows significant TWI along the inferior and lateral leads which is actually similar to prior tracings from 05/2019 and 07/2019 but more prominent when compared  to 08/2019. T-wave abnormalities improved after normalization of K+.  - Given her multiple cardiac risk factors (HTN, HLD, Type 2 DM, and family history of CAD) and known CAD by prior cath in 2008 a Lexiscan Myoview was recommended by Dr. Harl Bowie. Given that her nausea and vomiting have resolved and breathing is back to baseline, will plan for stress testing later this morning. She has been NPO since midnight. Nuclear Medicine and Stress Lab are aware.  3. Hypertensive Urgency - BP initially recorded as 239/68, improved following HD on admission. BP has been stable at 113/60 - 154/67 within the past 24 hours. Continue current medication regimen with Toprol-XL, Imdur and Clonidine.   4. ESRD - Underwent HD on Monday with 3.3L of fluid removed. Nephrology following. Planning for HD today.   5. Nausea/Vomiting - Reports symptoms have now resolved.  Scheduled to see GI on 02/05/2020. Remains on scheduled Reglan.     For questions or updates, please contact [place] Please consult [email or web address] for contact info under Cardiology/STEMI.   Arna Medici , PA-C 7:38 AM 01/07/2020 Pager: [phone]   Attending note:  Patient seen and examined. I reviewed hospital course and cardiology consultation by Dr. Harl Bowie. I agree with above assessment by Ms. Strader PA-C. Ms. Karam does not report any chest pain, nausea has improved. She anticipates follow-up hemodialysis session today.  She is afebrile, systolic blood pressure in the 140s, heart rate 60 in sinus rhythm. Lungs are clear. Cardiac exam reveals RRR with 1-2/5 systolic murmur at the right base.  Echocardiogram from November 1 reveals LVEF 55 to 60% with severe LVH and mild diastolic dysfunction, mild mitral stenosis, and moderately calcified aortic valve with mild aortic regurgitation. High-sensitivity troponin I levels are 40 to 50 range and not suggestive of ACS with flat pattern and in the setting of ESRD.  She has  been scheduled for a [place] today by Dr. Harl Bowie to evaluate overall ischemic burden. Would anticipate medical therapy unless there are high risk features or large territories of ischemia. She is currently on aspirin, Toprol-XL, Crestor and Imdur.  Satira Sark, M.D., F.A.C.C.

## 2020-01-07 NOTE — Discharge Summary (Addendum)
Physician Discharge Summary  [name] TMH:[id number] DOB: 1955-12-25 DOA: 01/05/2020  PCP: Sharion Balloon, FNP  Admit date: 01/05/2020 Discharge date: 01/07/2020  Admitted From: Home Disposition:  Home   Recommendations for Outpatient Follow-up:  1. Follow up with PCP in 1-2 weeks 2. Please obtain BMP/CBC in one week     Discharge Condition: Stable CODE STATUS: FULL Diet recommendation: Heart Healthy / Carb Modified   Brief/Interim Summary: 64 y.o.femalewith medical history significant ofchronic systolic heart failure, type 2 diabetes with nephropathy, end-stage renal disease, hypertension, hyperlipidemia, history of renal transplant; presented to the hospital secondary to increased shortness of breath and hypoxia. Patient expressed symptoms started yesterday night and has continued progressing. Symptom has been associated with episode of nausea and vomiting (patient expressed intermittently having nausea and vomiting). No hematemesis, no dysuria, no hematuria, no chest pain, no fever/chills, no productive cough, no focal weakness, no sick contacts. She is normally on hemodialysis Monday, Wednesday and Friday.  Covid test negative; patient is vaccinated  ED Course:Abnormal EKG, flat elevation of troponins and positive hypoxia. Chest x-ray demonstrating pulmonary edema and blood work with positive hyperkalemia. Nephrology was consulted for dialysis, cardiology consulted for further evaluation and management and the concerns of potential ACS. TRH has been called to place patient in the hospital to facilitate evaluation and treatment.  Discharge Diagnoses:  acute respiratory failure with hypoxia in the setting of pulmonary edema -initially on 5L in ED>>weaned to RA -pt endorsed indiscretion with fluid intake -Patient with prior history of systolic heart failure; repeat echo demonstrated a stabilization of her ejection fraction improved to 55-60% (prior EF  40-45%) -Continue to follow daily weights and strict I's and O's -Volume management with hemodialysis -HD on 01/05/20 and 01/07/20 -Continue low-sodium diet -Still feeling short of breath, but no oxygen supplementation required at rest. -Troponin has been elevated and per cardiology service recommendation plan will be for stress test most likely on 01/07/2020.  Chronic systolic and diastolic heart failure -Patient with prior history of systolic heart failure last ejection fraction 4045% -01/05/20 Echo EF 55-60%, no WMA, G1DD -Continue to follow daily weights, low-sodium diet and strict I's and O's -Continue metoprolol succinate -Volume management by dialysis -Follow cardiology service recommendations.  Elevated troponins/Atypical Chest pain 40>>52>>48>>41 - Given her multiple cardiac risk factors (HTN, HLD, Type 2 DM, and family history of CAD) and known CAD by prior cath in 2008 -11/3 myoview--intermediate risk-->no plans for cath this admission per cardiology  Non-diagnostic ST/T abnormalities and occasional PACs.  Medium sized, moderate to severe intensity, inferior/inferolateral defect from apex to base that is partially reversible and consistent with scar and moderate mid to apical peri-infarct ischemia.  EF 53% -follow up with her cardiologist at [facility] -continue metoprolol, imdur, statin -no cp at time of d/c  essential hypertension -Continue current antihypertensive regimen -Blood pressure stable. 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  end-stage renal disease/prior history of renal transplant. -Dialysis treatment as per nephrology service -Continue chronic use of steroids and Prograf.    Discharge  Instructions   Allergies as of 01/07/2020      Reactions   Other Other (See Comments)   Tape Bruises and tears skin, Paper tape tolerated   Tape Other (See Comments)   Bruises and tears skin Paper tape tolerated Bruises and tears skin Paper tape  tolerated Bruises and tears skin Paper tape tolerated      Medication List    STOP taking these medications   clotrimazole 10 MG troche Commonly known as: MYCELEX   nystatin 100000 UNIT/ML suspension Commonly known as: MYCOSTATIN   ondansetron 4 MG tablet Commonly known as: Zofran     TAKE these medications   acetaminophen 500 MG tablet Commonly known as: TYLENOL Take 500 mg by mouth every 6 (six) hours as needed for moderate pain or headache.   aspirin 81 MG EC tablet Commonly known as: Aspir-Low Take 1 tablet (81 mg total) by mouth daily with breakfast.   cloNIDine 0.1 MG tablet Commonly known as: CATAPRES Take 1 tablet (0.1 mg total) by mouth daily.   isosorbide mononitrate 60 MG 24 hr tablet Commonly known as: IMDUR Take 1 tablet (60 mg total) by mouth daily.   metoCLOPramide 5 MG tablet Commonly known as: REGLAN Take 1 tablet (5 mg total) by mouth 4 (four) times daily -  before meals and at bedtime.   metoprolol succinate 25 MG 24 hr tablet Commonly known as: TOPROL-XL Take 1 tablet (25 mg total) by mouth daily.   predniSONE 5 MG tablet Commonly known as: DELTASONE Take 1 tablet (5 mg total) by mouth daily with breakfast.   Prograf 0.5 MG capsule Generic drug: tacrolimus Take 0.5 mg by mouth daily.   Rena-Vite Rx 1 MG Tabs Take 1 tablet by mouth daily.   rosuvastatin 20 MG tablet Commonly known as: CRESTOR Take 1 tablet by mouth once daily   senna 8.6 MG Tabs tablet Commonly known as: SENOKOT Take 1 tablet (8.6 mg total) by mouth 2 (two) times daily.   sevelamer carbonate 800 MG tablet Commonly known as: RENVELA Take 1,600-3,200 mg by mouth 5 (five) times daily. Patient takes 4 tablets(3200mg ) 3  times a day with meals and 2 tablets(1600mg ) 2 times a day with snacks   Tresiba FlexTouch 100 UNIT/ML FlexTouch Pen Generic drug: insulin degludec Inject 0.15 mLs (15 Units total) into the skin daily.   Trulicity 1.5 YC/1.4GY Sopn Generic drug: Dulaglutide INJECT 1.5MG   SUBCUTANEOUSLY ONCE A WEEK       Follow-up Information    Frederik Pear, MD. Schedule an appointment as soon as possible for a visit in 1 week(s).   Specialty: Internal Medicine Contact information: [address] [place] [address] [phone]              Allergies  Allergen Reactions  . Other Other (See Comments)    Tape Bruises and tears skin, Paper tape tolerated  . Tape Other (See Comments)    Bruises and tears skin Paper tape tolerated Bruises and tears skin Paper tape tolerated Bruises and tears skin Paper tape tolerated    Consultations:  Renal  cardiology   Procedures/Studies: NM Myocar Multi W/Spect W/Wall Motion / EF  Result Date: 01/07/2020  Non-diagnostic ST/T abnormalities and occasional PACs.  Medium sized, moderate to severe intensity, inferior/inferolateral defect from apex to base that is partially reversible and consistent with scar and moderate mid to apical peri-infarct ischemia.  This is an intermediate risk study.  Nuclear stress EF: 53%.    DG Chest Portable 1 View  Result Date: 01/05/2020 CLINICAL DATA:  Shortness of breath EXAM: PORTABLE CHEST 1 VIEW COMPARISON:  09/02/2019 FINDINGS: Cardiomegaly and diffuse interstitial opacity with Kerley lines. There may be trace pleural fluid. No pneumothorax. Prominent main pulmonary artery contour, stable. IMPRESSION: Cardiomegaly and pulmonary edema. Electronically Signed  By: Monte Fantasia M.D.   On: 01/05/2020 08:29   ECHOCARDIOGRAM COMPLETE  Result Date: 01/05/2020    ECHOCARDIOGRAM REPORT   Patient Name:   [name] Date of Exam: 01/05/2020 Medical Rec #:  [id number]      Height:       69.0 in  Accession #:    [id number]     Weight:       163.4 lb Date of Birth:  07-30-1955      BSA:          1.896 m Patient Age:    45 years       BP:           98/49 mmHg Patient Gender: F              HR:           68 bpm. Exam Location:  [facility] Procedure: 2D Echo, Cardiac Doppler and Color Doppler Indications:    R06.02 SOB  History:        Patient has prior history of Echocardiogram examinations, most                 recent 12/27/2018. CHF, Aortic Valve Disease,                 Signs/Symptoms:Dyspnea and Shortness of Breath; Risk                 Factors:Diabetes and Dyslipidemia. Pulmonary edema. ESRD.  Sonographer:    Roseanna Rainbow RDCS (AE) Referring Phys: [id number] Alphonse Guild BRANCH  Sonographer Comments: Exam ended due to patient nausea and dry heaves. IMPRESSIONS  1. Left ventricular ejection fraction, by estimation, is 55 to 60%. The left ventricle has normal function. The left ventricle has no regional wall motion abnormalities. There is severe left ventricular hypertrophy. Left ventricular diastolic parameters  are consistent with Grade I diastolic dysfunction (impaired relaxation).  2. Right ventricular systolic function is normal. The right ventricular size is normal.  3. Left atrial size was moderately dilated.  4. The mitral valve is normal in structure. Mild mitral valve regurgitation. Mild mitral stenosis.  5. The aortic valve is tricuspid. There is moderate calcification of the aortic valve. There is moderate thickening of the aortic valve. Aortic valve regurgitation is mild. FINDINGS  Left Ventricle: Left ventricular ejection fraction, by estimation, is 55 to 60%. The left ventricle has normal function. The left ventricle has no regional wall motion abnormalities. The left ventricular internal cavity size was normal in size. There is  severe left ventricular hypertrophy. Left ventricular diastolic parameters are consistent with Grade I diastolic dysfunction (impaired relaxation). Indeterminate filling  pressures. Right Ventricle: The right ventricular size is normal. No increase in right ventricular wall thickness. Right ventricular systolic function is normal. Left Atrium: Left atrial size was moderately dilated. Right Atrium: Right atrial size was normal in size. Pericardium: There is no evidence of pericardial effusion. Mitral Valve: The mitral valve is normal in structure. There is mild thickening of the mitral valve leaflet(s). There is mild calcification of the mitral valve leaflet(s). Mild mitral annular calcification. Mild mitral valve regurgitation. Mild mitral valve stenosis. MV peak gradient, 10.2 mmHg. The mean mitral valve gradient is 3.0 mmHg. Tricuspid Valve: The tricuspid valve is normal in structure. Tricuspid valve regurgitation is not demonstrated. No evidence of tricuspid stenosis. Aortic Valve: The aortic valve is tricuspid. There is moderate calcification of the aortic valve. There is moderate thickening of the aortic valve. There  is moderate aortic valve annular calcification. Aortic valve regurgitation is mild. Pulmonic Valve: The pulmonic valve was not well visualized. Pulmonic valve regurgitation is mild. No evidence of pulmonic stenosis. Aorta: The aortic root is normal in size and structure. Pulmonary Artery: Indeterminant PASP, inadequate TR jet. IAS/Shunts: The interatrial septum was not well visualized.  LEFT VENTRICLE PLAX 2D LVIDd:         3.57 cm     Diastology LVIDs:         2.30 cm     LV e' medial:   4.03 cm/s LV PW:         2.06 cm     LV E/e' medial: 24.6 LV IVS:        2.00 cm LVOT diam:     1.90 cm LV SV:         48 LV SV Index:   26 LVOT Area:     2.84 cm  LV Volumes (MOD) LV vol d, MOD A4C: 56.2 ml LV vol s, MOD A4C: 28.0 ml LV SV MOD A4C:     56.2 ml LEFT ATRIUM           Index LA diam:      4.50 cm 2.37 cm/m LA Vol (A4C): 73.7 ml 38.88 ml/m  AORTIC VALVE LVOT Vmax:   88.05 cm/s LVOT Vmean:  53.200 cm/s LVOT VTI:    0.171 m  AORTA Ao Root diam: 3.10 cm Ao Asc diam:   3.50 cm MITRAL VALVE MV Area (PHT): 1.75 cm     SHUNTS MV Peak grad:  10.2 mmHg    Systemic VTI:  0.17 m MV Mean grad:  3.0 mmHg     Systemic Diam: 1.90 cm MV Vmax:       1.60 m/s MV Vmean:      82.8 cm/s MV Decel Time: 433 msec MV E velocity: 99.20 cm/s MV A velocity: 146.00 cm/s MV E/A ratio:  0.68 Carlyle Dolly MD Electronically signed by Carlyle Dolly MD Signature Date/Time: 01/05/2020/3:51:29 PM    Final         Discharge Exam: Vitals:   01/07/20 1345 01/07/20 1400  BP: (!) 118/53 (!) 112/51  Pulse: 64 62  Resp:    Temp:    SpO2:     Vitals:   01/07/20 1320 01/07/20 1330 01/07/20 1345 01/07/20 1400  BP: (!) 121/52 (!) 120/53 (!) 118/53 (!) 112/51  Pulse: 65 66 64 62  Resp: 16     Temp: 97.9 F (36.6 C)     TempSrc: Oral     SpO2: 97%     Weight: 69 kg     Height:        General: Pt is alert, awake, not in acute distress Cardiovascular: RRR, S1/S2 +, no rubs, no gallops Respiratory: fine bibasilar crackles. Abdominal: Soft, NT, ND, bowel sounds + Extremities: trace LE edema, no cyanosis   The results of significant diagnostics from this hospitalization (including imaging, microbiology, ancillary and laboratory) are listed below for reference.    Significant Diagnostic Studies: NM Myocar Multi W/Spect W/Wall Motion / EF  Result Date: 01/07/2020  Non-diagnostic ST/T abnormalities and occasional PACs.  Medium sized, moderate to severe intensity, inferior/inferolateral defect from apex to base that is partially reversible and consistent with scar and moderate mid to apical peri-infarct ischemia.  This is an intermediate risk study.  Nuclear stress EF: 53%.    DG Chest Portable 1 View  Result Date: 01/05/2020 CLINICAL DATA:  Shortness of  breath EXAM: PORTABLE CHEST 1 VIEW COMPARISON:  09/02/2019 FINDINGS: Cardiomegaly and diffuse interstitial opacity with Kerley lines. There may be trace pleural fluid. No pneumothorax. Prominent main pulmonary artery contour,  stable. IMPRESSION: Cardiomegaly and pulmonary edema. Electronically Signed   By: Monte Fantasia M.D.   On: 01/05/2020 08:29   ECHOCARDIOGRAM COMPLETE  Result Date: 01/05/2020    ECHOCARDIOGRAM REPORT   Patient Name:   [name] Date of Exam: 01/05/2020 Medical Rec #:  [id number]      Height:       69.0 in Accession #:    [id number]     Weight:       163.4 lb Date of Birth:  01-31-1956      BSA:          1.896 m Patient Age:    25 years       BP:           98/49 mmHg Patient Gender: F              HR:           68 bpm. Exam Location:  [facility] Procedure: 2D Echo, Cardiac Doppler and Color Doppler Indications:    R06.02 SOB  History:        Patient has prior history of Echocardiogram examinations, most                 recent 12/27/2018. CHF, Aortic Valve Disease,                 Signs/Symptoms:Dyspnea and Shortness of Breath; Risk                 Factors:Diabetes and Dyslipidemia. Pulmonary edema. ESRD.  Sonographer:    Roseanna Rainbow RDCS (AE) Referring Phys: [id number] Alphonse Guild BRANCH  Sonographer Comments: Exam ended due to patient nausea and dry heaves. IMPRESSIONS  1. Left ventricular ejection fraction, by estimation, is 55 to 60%. The left ventricle has normal function. The left ventricle has no regional wall motion abnormalities. There is severe left ventricular hypertrophy. Left ventricular diastolic parameters  are consistent with Grade I diastolic dysfunction (impaired relaxation).  2. Right ventricular systolic function is normal. The right ventricular size is normal.  3. Left atrial size was moderately dilated.  4. The mitral valve is normal in structure. Mild mitral valve regurgitation. Mild mitral stenosis.  5. The aortic valve is tricuspid. There is moderate calcification of the aortic valve. There is moderate thickening of the aortic valve. Aortic valve regurgitation is mild. FINDINGS  Left Ventricle: Left ventricular ejection fraction, by estimation, is 55 to 60%. The left ventricle has normal  function. The left ventricle has no regional wall motion abnormalities. The left ventricular internal cavity size was normal in size. There is  severe left ventricular hypertrophy. Left ventricular diastolic parameters are consistent with Grade I diastolic dysfunction (impaired relaxation). Indeterminate filling pressures. Right Ventricle: The right ventricular size is normal. No increase in right ventricular wall thickness. Right ventricular systolic function is normal. Left Atrium: Left atrial size was moderately dilated. Right Atrium: Right atrial size was normal in size. Pericardium: There is no evidence of pericardial effusion. Mitral Valve: The mitral valve is normal in structure. There is mild thickening of the mitral valve leaflet(s). There is mild calcification of the mitral valve leaflet(s). Mild mitral annular calcification. Mild mitral valve regurgitation. Mild mitral valve stenosis. MV peak gradient, 10.2 mmHg. The mean mitral valve gradient is 3.0 mmHg. Tricuspid Valve: The  tricuspid valve is normal in structure. Tricuspid valve regurgitation is not demonstrated. No evidence of tricuspid stenosis. Aortic Valve: The aortic valve is tricuspid. There is moderate calcification of the aortic valve. There is moderate thickening of the aortic valve. There is moderate aortic valve annular calcification. Aortic valve regurgitation is mild. Pulmonic Valve: The pulmonic valve was not well visualized. Pulmonic valve regurgitation is mild. No evidence of pulmonic stenosis. Aorta: The aortic root is normal in size and structure. Pulmonary Artery: Indeterminant PASP, inadequate TR jet. IAS/Shunts: The interatrial septum was not well visualized.  LEFT VENTRICLE PLAX 2D LVIDd:         3.57 cm     Diastology LVIDs:         2.30 cm     LV e' medial:   4.03 cm/s LV PW:         2.06 cm     LV E/e' medial: 24.6 LV IVS:        2.00 cm LVOT diam:     1.90 cm LV SV:         48 LV SV Index:   26 LVOT Area:     2.84 cm  LV  Volumes (MOD) LV vol d, MOD A4C: 56.2 ml LV vol s, MOD A4C: 28.0 ml LV SV MOD A4C:     56.2 ml LEFT ATRIUM           Index LA diam:      4.50 cm 2.37 cm/m LA Vol (A4C): 73.7 ml 38.88 ml/m  AORTIC VALVE LVOT Vmax:   88.05 cm/s LVOT Vmean:  53.200 cm/s LVOT VTI:    0.171 m  AORTA Ao Root diam: 3.10 cm Ao Asc diam:  3.50 cm MITRAL VALVE MV Area (PHT): 1.75 cm     SHUNTS MV Peak grad:  10.2 mmHg    Systemic VTI:  0.17 m MV Mean grad:  3.0 mmHg     Systemic Diam: 1.90 cm MV Vmax:       1.60 m/s MV Vmean:      82.8 cm/s MV Decel Time: 433 msec MV E velocity: 99.20 cm/s MV A velocity: 146.00 cm/s MV E/A ratio:  0.68 Carlyle Dolly MD Electronically signed by Carlyle Dolly MD Signature Date/Time: 01/05/2020/3:51:29 PM    Final      Microbiology: Recent Results (from the past 240 hour(s))  Respiratory Panel by RT PCR (Flu A&B, Covid) - Nasopharyngeal Swab     Status: None   Collection Time: 01/05/20  8:03 AM   Specimen: Nasopharyngeal Swab  Result Value Ref Range Status   SARS Coronavirus 2 by RT PCR NEGATIVE NEGATIVE Final    Comment: (NOTE) SARS-CoV-2 target nucleic acids are NOT DETECTED.  The SARS-CoV-2 RNA is generally detectable in upper respiratoy specimens during the acute phase of infection. The lowest concentration of SARS-CoV-2 viral copies this assay can detect is 131 copies/mL. A negative result does not preclude SARS-Cov-2 infection and should not be used as the sole basis for treatment or other patient management decisions. A negative result may occur with  improper specimen collection/handling, submission of specimen other than nasopharyngeal swab, presence of viral mutation(s) within the areas targeted by this assay, and inadequate number of viral copies (<131 copies/mL). A negative result must be combined with clinical observations, patient history, and epidemiological information. The expected result is Negative.  Fact Sheet for Patients:   PinkCheek.be  Fact Sheet for Healthcare Providers:  GravelBags.it  This test is no t yet approved  or cleared by the [place] and  has been authorized for detection and/or diagnosis of SARS-CoV-2 by FDA under an Emergency Use Authorization (EUA). This EUA will remain  in effect (meaning this test can be used) for the duration of the COVID-19 declaration under Section 564(b)(1) of the Act, 21 U.S.C. section 360bbb-3(b)(1), unless the authorization is terminated or revoked sooner.     Influenza A by PCR NEGATIVE NEGATIVE Final   Influenza B by PCR NEGATIVE NEGATIVE Final    Comment: (NOTE) The Xpert Xpress SARS-CoV-2/FLU/RSV assay is intended as an aid in  the diagnosis of influenza from Nasopharyngeal swab specimens and  should not be used as a sole basis for treatment. Nasal washings and  aspirates are unacceptable for Xpert Xpress SARS-CoV-2/FLU/RSV  testing.  Fact Sheet for Patients: PinkCheek.be  Fact Sheet for Healthcare Providers: GravelBags.it  This test is not yet approved or cleared by the [place] FDA and  has been authorized for detection and/or diagnosis of SARS-CoV-2 by  FDA under an Emergency Use Authorization (EUA). This EUA will remain  in effect (meaning this test can be used) for the duration of the  Covid-19 declaration under Section 564(b)(1) of the Act, 21  U.S.C. section 360bbb-3(b)(1), unless the authorization is  terminated or revoked. Performed at [facility], [address], [place], [place] [address]      Labs: Basic Metabolic Panel: Recent Labs  Lab 01/05/20 0906  NA 133*  K 5.8*  CL 97*  CO2 22  GLUCOSE 179*  BUN 60*  CREATININE 8.86*  CALCIUM 8.4*  MG 2.6*  PHOS 4.6   Liver Function Tests: Recent Labs  Lab 01/05/20 0906  AST 20  ALT 26  ALKPHOS 90  BILITOT 0.5  PROT 7.4  ALBUMIN 3.2*   No  results for input(s): LIPASE, AMYLASE in the last 168 hours. No results for input(s): AMMONIA in the last 168 hours. CBC: Recent Labs  Lab 01/05/20 0906  WBC 15.0*  NEUTROABS 12.2*  HGB 12.4  HCT 42.3  MCV 99.1  PLT 242   Cardiac Enzymes: No results for input(s): CKTOTAL, CKMB, CKMBINDEX, TROPONINI in the last 168 hours. BNP: Invalid input(s): POCBNP CBG: Recent Labs  Lab 01/05/20 1110 01/06/20 1600 01/06/20 2104 01/07/20 0748 01/07/20 1144  GLUCAP 138* 239* 197* 146* 218*    Time coordinating discharge:  36 minutes  Signed:  Orson Eva, DO [facility] Pager: [phone] 01/07/2020, 2:20 PM

## 2020-01-07 NOTE — Progress Notes (Signed)
°  [facility] Progress Note   Assessment/ Plan:   [facility] SJGGEZMO 29. HD Bath2/2.5, Dialyzerunknown, Heparin2000 load and then 600 per hour. Accessleft AVF- 16 gauge needles- 300 BFR/600 DFR.  meds include hect 2.5 tiw, epo 7400 tiw and vnofer 50 per week   Assessment/Plan:[name]- multiple medical issues including ESRD- Comes in on Monday morning SOB and nauseated 1SOB-Partially resolved with HD, still on [place] O2 this AM, for Lexiscan today and further HD today.  Repeat CBC today as well.   2 ESRD:normally MWF at [facility] via AVF- Plan for HD today as well (Wednesday) 3 Hypertension:UF as able with HD 4. Anemia of ESRD:not an issue right now- No ESA dose needed 5. Metabolic Bone Disease:continue her hectorol-, binder when eating 6. N/V- Says that this is not unusual for her- supportive care  7. History of renal transplant in 12/20.  Still on some low dose prograf and prednisone   Subjective:    For [place] today and HD as well.  No complaints.  Still on [place] O2.   Objective:   BP (!) 120/38 (BP Location: Right Wrist)    Pulse 61    Temp 97.8 F (36.6 C) (Oral)    Resp 17    Ht 5\' 9"  (1.753 m)    Wt 74.1 kg Comment: stretcher weight ?accuracy   SpO2 100%    BMI 24.12 kg/m   Physical Exam: Gen: NAD, lying in bed CVS: RRR Resp: bibasilar crackles Abd: soft Ext: no LE edema ACCESS: L forearm AVF + T/B   Labs: BMET Recent Labs  Lab 01/05/20 0906  NA 133*  K 5.8*  CL 97*  CO2 22  GLUCOSE 179*  BUN 60*  CREATININE 8.86*  CALCIUM 8.4*  PHOS 4.6   CBC Recent Labs  Lab 01/05/20 0906  WBC 15.0*  NEUTROABS 12.2*  HGB 12.4  HCT 42.3  MCV 99.1  PLT 242      Medications:     aspirin EC  81 mg Oral Q breakfast   b complex-vitamin c-folic acid  1 tablet Oral Daily   Chlorhexidine Gluconate Cloth  6 each Topical Q0600   Chlorhexidine Gluconate Cloth  6 each Topical Q0600   cloNIDine  0.1 mg  Oral Daily   doxercalciferol  2.5 mcg Intravenous Q M,W,F-HD   heparin injection (subcutaneous)  5,000 Units Subcutaneous Q8H   insulin aspart  0-5 Units Subcutaneous QHS   insulin aspart  0-9 Units Subcutaneous TID WC   isosorbide mononitrate  60 mg Oral Daily   metoCLOPramide  5 mg Oral TID AC & HS   metoprolol succinate  25 mg Oral Daily   predniSONE  5 mg Oral Q breakfast   rosuvastatin  20 mg Oral Daily   senna  1 tablet Oral BID   tacrolimus  0.5 mg Oral Daily     Sue Lips, MD 01/07/2020, 9:49 AM

## 2020-01-08 ENCOUNTER — Telehealth: Payer: Self-pay | Admitting: *Deleted

## 2020-01-08 NOTE — Telephone Encounter (Signed)
Transition Care Management Unsuccessful Follow-up Telephone Call  Date of discharge and from where:  01/07/20 [name]  Attempts:  1st  Reason for unsuccessful TCM follow-up call:    LMTCB on home telephone number. Blackwell,NVM on cell number.

## 2020-01-09 ENCOUNTER — Telehealth: Payer: Self-pay | Admitting: *Deleted

## 2020-01-09 NOTE — Telephone Encounter (Signed)
Contact Date: 01/09/2020  Contacted By: Baldomero Lamy, LPN  Transition Care Management Follow-up Telephone Call  Date of discharge and from where: [facility] 01/07/20  How have you been since you were released from the hospital? Fairly good  Any questions or concerns? No   Items Reviewed:  Did the pt receive and understand the discharge instructions provided? Yes   Medications obtained and verified? Yes  Any new allergies since your discharge? No   Dietary orders reviewed? No  Do you have support at home? Yes   Discontinued Medications clotrimazole 10 MG troche Commonly known as: MYCELEX   nystatin 100000 UNIT/ML suspension Commonly known as: MYCOSTATIN   ondansetron 4 MG tablet Commonly known as: Zofran    New Medications Added NONE  Current Medication List Allergies as of 01/09/2020      Reactions   Other Other (See Comments)   Tape Bruises and tears skin, Paper tape tolerated   Tape Other (See Comments)   Bruises and tears skin Paper tape tolerated Bruises and tears skin Paper tape tolerated Bruises and tears skin Paper tape tolerated      Medication List       Accurate as of January 09, 2020  4:01 PM. If you have any questions, ask your nurse or doctor.        acetaminophen 500 MG tablet Commonly known as: TYLENOL Take 500 mg by mouth every 6 (six) hours as needed for moderate pain or headache.   aspirin 81 MG EC tablet Commonly known as: Aspir-Low Take 1 tablet (81 mg total) by mouth daily with breakfast.   cloNIDine 0.1 MG tablet Commonly known as: CATAPRES Take 1 tablet (0.1 mg total) by mouth daily.   isosorbide mononitrate 60 MG 24 hr tablet Commonly known as: IMDUR Take 1 tablet (60 mg total) by mouth daily.   metoCLOPramide 5 MG tablet Commonly known as: REGLAN Take 1 tablet (5 mg total) by mouth 4 (four) times daily -  before meals and at bedtime.   metoprolol succinate 25 MG 24 hr tablet Commonly known as: TOPROL-XL Take 1 tablet  (25 mg total) by mouth daily.   predniSONE 5 MG tablet Commonly known as: DELTASONE Take 1 tablet (5 mg total) by mouth daily with breakfast.   Prograf 0.5 MG capsule Generic drug: tacrolimus Take 0.5 mg by mouth daily.   Rena-Vite Rx 1 MG Tabs Take 1 tablet by mouth daily.   rosuvastatin 20 MG tablet Commonly known as: CRESTOR Take 1 tablet by mouth once daily   senna 8.6 MG Tabs tablet Commonly known as: SENOKOT Take 1 tablet (8.6 mg total) by mouth 2 (two) times daily.   sevelamer carbonate 800 MG tablet Commonly known as: RENVELA Take 1,600-3,200 mg by mouth 5 (five) times daily. Patient takes 4 tablets(3200mg ) 3 times a day with meals and 2 tablets(1600mg ) 2 times a day with snacks   Tresiba FlexTouch 100 UNIT/ML FlexTouch Pen Generic drug: insulin degludec Inject 0.15 mLs (15 Units total) into the skin daily.   Trulicity 1.5 SF/6.8LE Sopn Generic drug: Dulaglutide INJECT 1.5MG   SUBCUTANEOUSLY ONCE A WEEK        Home Care and Equipment/Supplies: Were home health services ordered? no If so, what is the name of the agency? N/A Has the agency set up a time to come to the patient's home? not applicable Were any new equipment or medical supplies ordered?  No What is the name of the medical supply agency? N/A Were you able to get the  supplies/equipment? not applicable Do you have any questions related to the use of the equipment or supplies? No  Functional Questionnaire: (I = Independent and D = Dependent) ADLs: I  Bathing/Dressing- I  Meal Prep- I    Eating- I  Maintaining continence- I  Transferring/Ambulation- I  Managing Meds- I  Follow up appointments reviewed:   PCP Hospital f/u appt confirmed? Yes  Scheduled to see Evelina Dun FNP 01/13/20 @ 11:10AM .  [facility] f/u appt confirmed? YES Scheduled to see Dr Doneta Public 01/27/20 @ 10:10.  Are transportation arrangements needed? NO  If their condition worsens, is the pt aware to call PCP or  go to the Emergency Dept.? Yes  Was the patient provided with contact information for the PCP's office or ED? Yes  Was to pt encouraged to call back with questions or concerns? Yes

## 2020-01-13 ENCOUNTER — Ambulatory Visit (INDEPENDENT_AMBULATORY_CARE_PROVIDER_SITE_OTHER): Payer: Medicare Other | Admitting: Family

## 2020-01-13 ENCOUNTER — Encounter: Payer: Self-pay | Admitting: Family

## 2020-01-13 ENCOUNTER — Other Ambulatory Visit: Payer: Self-pay

## 2020-01-13 VITALS — BP 157/61 | HR 70 | Temp 98.0°F | Ht 69.0 in

## 2020-01-13 DIAGNOSIS — I5031 Acute diastolic (congestive) heart failure: Secondary | ICD-10-CM

## 2020-01-13 DIAGNOSIS — I5032 Chronic diastolic (congestive) heart failure: Secondary | ICD-10-CM | POA: Diagnosis not present

## 2020-01-13 DIAGNOSIS — I10 Essential (primary) hypertension: Secondary | ICD-10-CM

## 2020-01-13 DIAGNOSIS — G4700 Insomnia, unspecified: Secondary | ICD-10-CM

## 2020-01-13 DIAGNOSIS — E1165 Type 2 diabetes mellitus with hyperglycemia: Secondary | ICD-10-CM | POA: Diagnosis not present

## 2020-01-13 DIAGNOSIS — E785 Hyperlipidemia, unspecified: Secondary | ICD-10-CM

## 2020-01-13 DIAGNOSIS — Z09 Encounter for follow-up examination after completed treatment for conditions other than malignant neoplasm: Secondary | ICD-10-CM

## 2020-01-13 DIAGNOSIS — E1169 Type 2 diabetes mellitus with other specified complication: Secondary | ICD-10-CM

## 2020-01-13 DIAGNOSIS — Z794 Long term (current) use of insulin: Secondary | ICD-10-CM

## 2020-01-13 DIAGNOSIS — N186 End stage renal disease: Secondary | ICD-10-CM | POA: Diagnosis not present

## 2020-01-13 DIAGNOSIS — R531 Weakness: Secondary | ICD-10-CM

## 2020-01-13 MED ORDER — TRULICITY 1.5 MG/0.5ML ~~LOC~~ SOAJ: SUBCUTANEOUS | 0 refills | Status: DC

## 2020-01-13 MED ORDER — TRESIBA FLEXTOUCH 100 UNIT/ML ~~LOC~~ SOPN: 15.0000 [IU] | PEN_INJECTOR | Freq: Every day | SUBCUTANEOUS | 2 refills | Status: DC

## 2020-01-13 MED ORDER — ROSUVASTATIN CALCIUM 20 MG PO TABS: 20.0000 mg | ORAL_TABLET | Freq: Every day | ORAL | 2 refills | Status: DC

## 2020-01-13 MED ORDER — CLONIDINE HCL 0.1 MG PO TABS: 0.1000 mg | ORAL_TABLET | Freq: Every day | ORAL | 3 refills | Status: DC

## 2020-01-13 MED ORDER — ISOSORBIDE MONONITRATE ER 60 MG PO TB24: 60.0000 mg | ORAL_TABLET | Freq: Every day | ORAL | 3 refills | Status: DC

## 2020-01-13 MED ORDER — METOPROLOL SUCCINATE ER 25 MG PO TB24: 25.0000 mg | ORAL_TABLET | Freq: Every day | ORAL | 3 refills | Status: DC

## 2020-01-13 NOTE — Patient Instructions (Signed)

## 2020-01-13 NOTE — Progress Notes (Addendum)
Subjective:    Patient ID: [name], female    DOB: 05-Jan-1956, 64 y.o.   MRN: [id number]  Chief Complaint  Patient presents with  . Transitions Of Care    HPI Today's visit was for Transitional Care Management.  The patient was discharged from [facility] on 11/03/212 with a primary diagnosis of Acute Respiratory failure, ESRD. Marland Kitchen   Contact with the patient and/or caregiver, by a clinical staff member, was made on 01/09/20 and was documented as a telephone encounter within the EMR.  Through chart review and discussion with the patient I have determined that management of their condition is of high complexity.    Pt went to the ED on 01/05/20 with increased SOB. She currently is doing hemodialysis on M,W, & F. She had a chest x-ray that showed pulmonary edema and lab work that showed hyperkalemia.   She weighs M, W, &F and states she does weight at home. She is currently on a fluid restriction of 36 oz/day.    She has a follow up with her Cardiologists, Dr Doneta Public 01/27/20 @ 10:10 for CHF.    She reports she is having some insomnia and increased GAD.   Review of Systems  Constitutional: Positive for fatigue.  Respiratory: Positive for shortness of breath.   Neurological: Positive for weakness.  All other systems reviewed and are negative.      Objective:   Physical Exam Vitals reviewed.  Constitutional:      General: She is not in acute distress.    Appearance: She is well-developed.  HENT:     Head: Normocephalic and atraumatic.     Right Ear: Tympanic membrane normal.     Left Ear: Tympanic membrane normal.  Eyes:     Pupils: Pupils are equal, round, and reactive to light.  Neck:     Thyroid: No thyromegaly.  Cardiovascular:     Rate and Rhythm: Normal rate and regular rhythm.     Heart sounds: Murmur heard.   Pulmonary:     Effort: Pulmonary effort is normal. No respiratory distress.     Breath sounds: Normal breath sounds. No wheezing.  Abdominal:      General: Bowel sounds are normal. There is no distension.     Palpations: Abdomen is soft.     Tenderness: There is no abdominal tenderness.  Musculoskeletal:        General: No tenderness. Normal range of motion.     Cervical back: Normal range of motion and neck supple.  Skin:    General: Skin is warm and dry.  Neurological:     Mental Status: She is alert and oriented to person, place, and time.     Cranial Nerves: No cranial nerve deficit.     Motor: Weakness present.     Deep Tendon Reflexes: Reflexes are normal and symmetric.  Psychiatric:        Behavior: Behavior normal.        Thought Content: Thought content normal.        Judgment: Judgment normal.        BP (!) 157/61   Pulse 70   Temp 98 F (36.7 C) (Temporal)   Ht $R'5\' 9"'Ko$  (1.753 m)   SpO2 98%   BMI 22.46 kg/m   Assessment & Plan:  [name] comes in today with chief complaint of Transitions Of Care   Diagnosis and orders addressed: 1. Type 2 diabetes mellitus with hyperglycemia, with long-term current use of insulin (  [place]) - insulin degludec (TRESIBA FLEXTOUCH) 100 UNIT/ML FlexTouch Pen; Inject 15 Units into the skin daily.  Dispense: 12 mL; Refill: 2 - Dulaglutide (TRULICITY) 1.5 JW/9.2HV SOPN; INJECT 1.$RemoveBefore'5MG'JZerHjBeHnwqM$   SUBCUTANEOUSLY ONCE A WEEK  Dispense: 12 mL; Refill: 0 - CMP14+EGFR; Future - CBC with Differential/Platelet; Future  2. Hyperlipidemia associated with type 2 diabetes mellitus (HCC) - rosuvastatin (CRESTOR) 20 MG tablet; Take 1 tablet (20 mg total) by mouth daily.  Dispense: 90 tablet; Refill: 2 - CMP14+EGFR; Future - CBC with Differential/Platelet; Future  3. Essential hypertension - cloNIDine (CATAPRES) 0.1 MG tablet; Take 1 tablet (0.1 mg total) by mouth daily.  Dispense: 90 tablet; Refill: 3 - metoprolol succinate (TOPROL-XL) 25 MG 24 hr tablet; Take 1 tablet (25 mg total) by mouth daily.  Dispense: 90 tablet; Refill: 3 - CMP14+EGFR; Future - CBC with Differential/Platelet; Future  4.  Hospital discharge follow-up - CMP14+EGFR; Future - CBC with Differential/Platelet; Future  5. Insomnia, unspecified type - CMP14+EGFR; Future - CBC with Differential/Platelet; Future  6. Acute diastolic CHF (congestive heart failure) (HCC) - CMP14+EGFR; Future - CBC with Differential/Platelet; Future  7. Chronic diastolic heart failure (HCC)  - CMP14+EGFR; Future - CBC with Differential/Platelet; Future  8. ESRD (end stage renal disease) ([place]) - CMP14+EGFR; Future - CBC with Differential/Platelet; Future  9. Generalized weakness - CMP14+EGFR; Future - CBC with Differential/Platelet; Future   PT will get labs tomorrow and dialysis  Health Maintenance reviewed Diet and exercise encouraged  Follow up plan: 6 months    Evelina Dun, FNP

## 2020-01-14 ENCOUNTER — Telehealth: Payer: Self-pay

## 2020-01-14 NOTE — Telephone Encounter (Signed)
Pts daughter called stating that Alyse Low told pt that she would send in a Rx for Tramadol at her visit yesterday but did not send it in.  Needs Rx sent to [facility] in [place].

## 2020-01-14 NOTE — Telephone Encounter (Signed)
Will you please review. Do not see anything documented in note

## 2020-01-15 MED ORDER — TRAZODONE HCL 50 MG PO TABS
50.0000 mg | ORAL_TABLET | Freq: Every evening | ORAL | 3 refills | Status: DC | PRN
Start: 2020-01-15 — End: 2020-06-29

## 2020-01-15 NOTE — Telephone Encounter (Signed)
Attempted to contact patient - NVM 

## 2020-01-15 NOTE — Telephone Encounter (Signed)
Trazodone Prescription sent to pharmacy

## 2020-01-15 NOTE — Telephone Encounter (Signed)
Patient aware.

## 2020-02-05 ENCOUNTER — Ambulatory Visit (INDEPENDENT_AMBULATORY_CARE_PROVIDER_SITE_OTHER): Payer: BLUE CROSS/BLUE SHIELD | Admitting: Gastroenterology

## 2020-02-13 ENCOUNTER — Telehealth (INDEPENDENT_AMBULATORY_CARE_PROVIDER_SITE_OTHER): Payer: Medicare Other | Admitting: Nurse Practitioner

## 2020-02-13 ENCOUNTER — Encounter: Payer: Self-pay | Admitting: Nurse Practitioner

## 2020-02-13 DIAGNOSIS — Z09 Encounter for follow-up examination after completed treatment for conditions other than malignant neoplasm: Secondary | ICD-10-CM | POA: Diagnosis not present

## 2020-02-13 DIAGNOSIS — R0781 Pleurodynia: Secondary | ICD-10-CM

## 2020-02-13 MED ORDER — DICLOFENAC SODIUM 1 % EX GEL: 2.0000 g | Freq: Four times a day (QID) | CUTANEOUS | 3 refills | Status: DC

## 2020-02-13 MED ORDER — NAPROXEN 500 MG PO TABS: 500.0000 mg | ORAL_TABLET | Freq: Two times a day (BID) | ORAL | 0 refills | Status: DC

## 2020-02-13 NOTE — Progress Notes (Signed)
Virtual Visit via Video Note   This visit type was conducted due to national recommendations for restrictions regarding the COVID-19 Pandemic (e.g. social distancing) in an effort to limit this patient's exposure and mitigate transmission in our community.  Due to her co-morbid illnesses, this patient is at least at moderate risk for complications without adequate follow up.  This format is felt to be most appropriate for this patient at this time.  All issues noted in this document were discussed and addressed.  A limited physical exam was performed with this format.  A verbal consent was obtained for the virtual visit.   Date:  02/13/2020   ID:  [name], DOB 09-Oct-1955, MRN [id number]  Patient Location: Home Provider Location: Office/Clinic  PCP:  Sharion Balloon, FNP   Evaluation Performed:  Follow-Up Visit  Chief Complaint: Pain/follow-up after hospital visit.  History of Present Illness:    [name] is a 64 y.o. female with Pain  She reports recurrent rib pain from a fall in the last few days. it was an injury that may have caused the pain. The pain started a few days ago and is rapidly worsening. The pain does not radiate . The pain is described as aching, is severe in intensity, occurring constantly. Symptoms are worse in the: morning, mid-day, afternoon, evening, nighttime  Aggravating factors: Movement Relieving factors: none.  She has tried application of ice and acetaminophen with little relief.   ---------------------------------------------------------------------------------------------------   The patient does not have symptoms concerning for COVID-19 infection (fever, chills, cough, or new shortness of breath).    Past Medical History:  Diagnosis Date  . Anemia    of chronic disease  . Blood transfusion without reported diagnosis   . Cardiovascular disease    nonobstructive  . Carotid artery stenosis 2008  . CHF (congestive heart failure) ([place])    . Coronary artery disease   . Diabetes mellitus   . Dyslipidemia   . Edema of lower extremity    with hypo-albuminemia and profound protenuria  . Heart murmur   . Hypertension   . Mitral regurgitation   . Nephrotic syndrome   . Patent foramen ovale   . Pulmonary hypertension, moderate to severe ([place])   . Pulmonary nodule   . Renal insufficiency   . Tricuspid regurgitation   . Volume depletion, renal, due to output loss (renal deficit)     Past Surgical History:  Procedure Laterality Date  . A/V FISTULAGRAM N/A 04/05/2017   Procedure: A/V FISTULAGRAM - Left Arm;  Surgeon: Angelia Mould, MD;  Location: [place] [facility];  Service: Cardiovascular;  Laterality: N/A;  . A/V FISTULAGRAM Left 09/18/2017   Procedure: A/V FISTULAGRAM;  Surgeon: Serafina Mitchell, MD;  Location: [place] [facility];  Service: Cardiovascular;  Laterality: Left;  . A/V SHUNT INTERVENTION Left 04/05/2017   Procedure: A/V SHUNT INTERVENTION;  Surgeon: Angelia Mould, MD;  Location: [place] [facility];  Service: Cardiovascular;  Laterality: Left;  . CARDIAC CATHETERIZATION  2008  . IR REMOVAL TUN CV CATH W/O FL  11/02/2016  . 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 PERIPHERAL VASCULAR BALLOON ANGIOPLASTY Left 09/18/2017   Procedure: PERIPHERAL VASCULAR BALLOON ANGIOPLASTY;  Surgeon: Serafina Mitchell, MD;  Location: [place] [facility];  Service: Cardiovascular;  Laterality: Left;  Arm Fistula    Family History  Problem Relation Age of Onset  . Kidney disease Mother   . Diabetes Mother   . Diabetes Father   .  Heart disease Father     Social History   Socioeconomic History  . Marital status: Married    Spouse name: Not on file  . Number of children: Not on file  . Years of education: Not on file  . Highest education level: Not on file  Occupational History  . Not on file  Tobacco Use  . Smoking status: Never Smoker  . Smokeless tobacco: Never Used  Vaping Use  . Vaping Use: Never used   Substance and Sexual Activity  . Alcohol use: No  . Drug use: No  . Sexual activity: Never  Other Topics Concern  . Not on file  Social History Narrative  . Not on file   Social Determinants of Health   Financial Resource Strain: Not on file  Food Insecurity: Not on file  Transportation Needs: Not on file  Physical Activity: Not on file  Stress: Not on file  Social Connections: Not on file  Intimate Partner Violence: Not on file    Outpatient Medications Prior to Visit  Medication Sig Dispense Refill  . acetaminophen (TYLENOL) 500 MG tablet Take 500 mg by mouth every 6 (six) hours as needed for moderate pain or headache.     Marland Kitchen aspirin (ASPIR-LOW) 81 MG EC tablet Take 1 tablet (81 mg total) by mouth daily with breakfast. 30 tablet 12  . B Complex-C-Folic Acid (RENA-VITE RX) 1 MG TABS Take 1 tablet by mouth daily.    . cloNIDine (CATAPRES) 0.1 MG tablet Take 1 tablet (0.1 mg total) by mouth daily. 90 tablet 3  . Dulaglutide (TRULICITY) 1.5 YQ/6.5HQ SOPN INJECT 1.5MG   SUBCUTANEOUSLY ONCE A WEEK 12 mL 0  . insulin degludec (TRESIBA FLEXTOUCH) 100 UNIT/ML FlexTouch Pen Inject 15 Units into the skin daily. 12 mL 2  . isosorbide mononitrate (IMDUR) 60 MG 24 hr tablet Take 1 tablet (60 mg total) by mouth daily. 90 tablet 3  . metoCLOPramide (REGLAN) 5 MG tablet Take 1 tablet (5 mg total) by mouth 4 (four) times daily -  before meals and at bedtime. 60 tablet 0  . metoprolol succinate (TOPROL-XL) 25 MG 24 hr tablet Take 1 tablet (25 mg total) by mouth daily. 90 tablet 3  . predniSONE (DELTASONE) 5 MG tablet Take 1 tablet (5 mg total) by mouth daily with breakfast.    . PROGRAF 0.5 MG capsule Take 0.5 mg by mouth daily.   0  . rosuvastatin (CRESTOR) 20 MG tablet Take 1 tablet (20 mg total) by mouth daily. 90 tablet 2  . senna (SENOKOT) 8.6 MG TABS tablet Take 1 tablet (8.6 mg total) by mouth 2 (two) times daily. 120 tablet 0  . sevelamer carbonate (RENVELA) 800 MG tablet Take 1,600-3,200  mg by mouth 5 (five) times daily. Patient takes 4 tablets(3200mg ) 3 times a day with meals and 2 tablets(1600mg ) 2 times a day with snacks    . traZODone (DESYREL) 50 MG tablet Take 1-2 tablets (50-100 mg total) by mouth at bedtime as needed for sleep. 60 tablet 3   No facility-administered medications prior to visit.    Allergies:   Other and Tape   Social History   Tobacco Use  . Smoking status: Never Smoker  . Smokeless tobacco: Never Used  Vaping Use  . Vaping Use: Never used  Substance Use Topics  . Alcohol use: No  . Drug use: No     Review of Systems  Constitutional: Negative for chills and fever.  Musculoskeletal: Positive for falls.  Rib pain  All other systems reviewed and are negative.    Labs/Other Tests and Data Reviewed:    Recent Labs: 08/10/2019: B Natriuretic Peptide >4,500.0 01/05/2020: ALT 26; Magnesium 2.6 01/07/2020: BUN 48; Creatinine, Ser 8.23; Hemoglobin 10.7; Platelets 201; Potassium 5.9; Sodium 128   Recent Lipid Panel Lab Results  Component Value Date/Time   CHOL 95 (L) 01/29/2018 11:50 AM   TRIG 95 12/15/2018 12:29 PM   HDL 44 01/29/2018 11:50 AM   CHOLHDL 2.2 01/29/2018 11:50 AM   LDLCALC 30 01/29/2018 11:50 AM    Wt Readings from Last 3 Encounters:  01/07/20 152 lb 1.9 oz (69 kg)  12/23/19 145 lb (65.8 kg)  08/10/19 150 lb (68 kg)     Objective:    Vital Signs:  There were no vitals taken for this visit.   Physical Exam    Video visit: Patient is lying in bed, looking uncomfortable and reporting moderate to severe pain communication.   ASSESSMENT & PLAN:   1. Hospital discharge follow-up  Patient went to the ED 02/05/2020 for due to a fall.  Patient is reporting worsening pain and body ache.  Patient denies any fever, any broken rib, nausea or vomiting.  Patient has used ice compress, and Tylenol but has no therapeutic effect.  Advised patient to follow-up with unresolved symptoms.  Started patient on Voltaren gel and  naproxen 500 mg tablet twice daily with food.  2. Rib pain - diclofenac Sodium (VOLTAREN) 1 % GEL; Apply 2 g topically 4 (four) times daily.  Dispense: 50 g; Refill: 3 - naproxen (NAPROSYN) 500 MG tablet; Take 1 tablet (500 mg total) by mouth 2 (two) times daily with a meal.  Dispense: 30 tablet; Refill: 0    Provided education to patient with printed handouts given.,  Started patient on Voltaren gel, naproxen 500 mg twice daily with meals. Follow-up with worsening or unresolved symptoms.  COVID-19 Education: The signs and symptoms of COVID-19 were discussed with the patient and how to seek care for testing (follow up with PCP or arrange E-visit). The importance of social distancing was discussed today.  Time:   Today, I have spent 20 minutes with the patient with telehealth technology discussing the above problems.    Follow Up:  Virtual Visit  prn  Signed, Ivy Lynn, NP  02/13/2020 10:31 PM    [place]

## 2020-02-13 NOTE — Assessment & Plan Note (Signed)
Left-sided rib pain not well managed.  Started patient on Voltaren gel and naproxen 500 mg tablet by mouth twice daily with meals. Follow-up with worsening or unresolved symptoms.  Rx sent to pharmacy

## 2020-02-13 NOTE — Assessment & Plan Note (Signed)
Patient went to the ED 02/05/2020 for due to a fall.  Patient is reporting worsening pain and body ache.  Patient denies any fever, any broken rib, nausea or vomiting.  Patient has used ice compress, and Tylenol but has no therapeutic effect.  Advised patient to follow-up with unresolved symptoms.  Started patient on Voltaren gel and naproxen 500 mg tablet twice daily with food.

## 2020-02-16 ENCOUNTER — Telehealth: Payer: Self-pay | Admitting: Family Medicine

## 2020-02-16 NOTE — Telephone Encounter (Signed)
Attempted to contact patient - NVM 

## 2020-02-16 NOTE — Telephone Encounter (Signed)
PA was denied today for Diclofenac gel.

## 2020-02-16 NOTE — Telephone Encounter (Signed)
Patient can purchase Voltaren gel over-the-counter, AleveX, or IcyHot.

## 2020-02-16 NOTE — Telephone Encounter (Signed)
Your information has been submitted to [facility] [place]. Blue Anagabriela Jokerst [place] will review the request and notify you of the determination decision directly, typically within 3 business days of your submission and once all necessary information is received.  You will also receive your request decision electronically. To check for an update later, open the request again from your dashboard.  If Blue Christyna Letendre [place] has not responded within the specified timeframe or if you have any questions about your PA submission, contact Blue Collie W[place] [place] directly at (MAPD) [phone] or (PDP) [phone]. 

## 2020-02-17 ENCOUNTER — Telehealth: Payer: Self-pay | Admitting: Family

## 2020-02-17 ENCOUNTER — Telehealth: Payer: Self-pay

## 2020-02-17 NOTE — Telephone Encounter (Signed)
[name] has taken some for me recently, so I'm okay with this if she is. WS

## 2020-02-17 NOTE — Telephone Encounter (Signed)
PCP changed and pt is aware.

## 2020-02-17 NOTE — Telephone Encounter (Signed)
FOLLOW UP SCHEDULED

## 2020-02-17 NOTE — Telephone Encounter (Signed)
This is fine 

## 2020-02-17 NOTE — Telephone Encounter (Signed)
This has already been addressed

## 2020-02-17 NOTE — Telephone Encounter (Signed)
Pt was seen in the ER in [facility] 12/13 because she was fidgety and heart was racing, was told to follow up with primary care and see about get anxiety medication

## 2020-02-19 ENCOUNTER — Other Ambulatory Visit (HOSPITAL_COMMUNITY)
Admission: RE | Admit: 2020-02-19 | Discharge: 2020-02-19 | Disposition: A | Discharge status: Home / Self Care | Payer: Medicare Other | Source: Ambulatory Visit | Attending: Family Medicine | Admitting: Family Medicine

## 2020-02-19 ENCOUNTER — Ambulatory Visit (INDEPENDENT_AMBULATORY_CARE_PROVIDER_SITE_OTHER): Payer: Medicare Other | Admitting: Family Medicine

## 2020-02-19 ENCOUNTER — Other Ambulatory Visit: Payer: Self-pay

## 2020-02-19 ENCOUNTER — Encounter: Payer: Self-pay | Admitting: Family Medicine

## 2020-02-19 VITALS — BP 149/73 | HR 67 | Temp 98.0°F | Ht 69.0 in

## 2020-02-19 DIAGNOSIS — N898 Other specified noninflammatory disorders of vagina: Secondary | ICD-10-CM | POA: Diagnosis present

## 2020-02-19 DIAGNOSIS — R4182 Altered mental status, unspecified: Secondary | ICD-10-CM

## 2020-02-19 DIAGNOSIS — R451 Restlessness and agitation: Secondary | ICD-10-CM

## 2020-02-19 MED ORDER — HYDROXYZINE HCL 25 MG PO TABS: 25.0000 mg | ORAL_TABLET | Freq: Three times a day (TID) | ORAL | 1 refills | Status: DC | PRN

## 2020-02-19 NOTE — Addendum Note (Signed)
Addended by: Caryl Pina on: 02/19/2020 04:21 PM   Modules accepted: Orders

## 2020-02-19 NOTE — Progress Notes (Signed)
BP (!) 149/73   Pulse 67   Temp 98 F (36.7 C)   Ht $R'5\' 9"'Vv$  (1.753 m)   SpO2 97%   BMI 22.46 kg/m    Subjective:   Patient ID: [name], female    DOB: [birth year], 64 y.o.   MRN: [id number]  HPI: [name] is a 64 y.o. female presenting on 02/19/2020 for Hospitalization Follow-up and Anxiety   HPI Patient is coming in today for what they think is anxiety or panic attacks that happen after dialysis, happened 4 days ago and then again yesterday after dialysis episode she will be confused and agitated and irritated and altered and they took her to the emergency department and did not find anything wrong.  They are coming in today because they are concerned about what is going on.  States that she is having some urinary or vaginal leakage and drainage on her underwear but she does not make urine anymore.  They deny her having any abdominal pain that they know of but she has been more confused and agitated on certain days especially after dialysis states.  This has been going on over the past week she is also been more weak and that normally she is up and walking around and now they are having to keep her more sitting and more in a wheelchair.  Relevant past medical, surgical, family and social history reviewed and updated as indicated. Interim medical history since our last visit reviewed. Allergies and medications reviewed and updated.  Review of Systems  Constitutional: Negative for chills and fever.  Eyes: Negative for visual disturbance.  Respiratory: Negative for chest tightness and shortness of breath.   Cardiovascular: Negative for chest pain and leg swelling.  Musculoskeletal: Negative for back pain and gait problem.  Skin: Negative for rash.  Neurological: Negative for light-headedness and headaches.  Psychiatric/Behavioral: Positive for agitation. Negative for behavioral problems. The patient is nervous/anxious.   All other systems reviewed and are negative.   Per  HPI unless specifically indicated above   Allergies as of 02/19/2020      Reactions   Other Other (See Comments)   Tape Bruises and tears skin, Paper tape tolerated   Tape Other (See Comments)   Bruises and tears skin Paper tape tolerated Bruises and tears skin Paper tape tolerated Bruises and tears skin Paper tape tolerated      Medication List       Accurate as of February 19, 2020  1:12 PM. If you have any questions, ask your nurse or doctor.        acetaminophen 500 MG tablet Commonly known as: TYLENOL Take 500 mg by mouth every 6 (six) hours as needed for moderate pain or headache.   aspirin 81 MG EC tablet Commonly known as: Aspir-Low Take 1 tablet (81 mg total) by mouth daily with breakfast.   cloNIDine 0.1 MG tablet Commonly known as: CATAPRES Take 1 tablet (0.1 mg total) by mouth daily.   diclofenac Sodium 1 % Gel Commonly known as: Voltaren Apply 2 g topically 4 (four) times daily.   isosorbide mononitrate 60 MG 24 hr tablet Commonly known as: IMDUR Take 1 tablet (60 mg total) by mouth daily.   metoCLOPramide 5 MG tablet Commonly known as: REGLAN Take 1 tablet (5 mg total) by mouth 4 (four) times daily -  before meals and at bedtime.   metoprolol succinate 25 MG 24 hr tablet Commonly known as: TOPROL-XL Take 1 tablet (25 mg total)  by mouth daily.   naproxen 500 MG tablet Commonly known as: Naprosyn Take 1 tablet (500 mg total) by mouth 2 (two) times daily with a meal.   predniSONE 5 MG tablet Commonly known as: DELTASONE Take 1 tablet (5 mg total) by mouth daily with breakfast.   Prograf 0.5 MG capsule Generic drug: tacrolimus Take 0.5 mg by mouth daily.   Rena-Vite Rx 1 MG Tabs Take 1 tablet by mouth daily.   rosuvastatin 20 MG tablet Commonly known as: CRESTOR Take 1 tablet (20 mg total) by mouth daily.   senna 8.6 MG Tabs tablet Commonly known as: SENOKOT Take 1 tablet (8.6 mg total) by mouth 2 (two) times daily.   sevelamer  carbonate 800 MG tablet Commonly known as: RENVELA Take 1,600-3,200 mg by mouth 5 (five) times daily. Patient takes 4 tablets(3200mg ) 3 times a day with meals and 2 tablets(1600mg ) 2 times a day with snacks   traZODone 50 MG tablet Commonly known as: DESYREL Take 1-2 tablets (50-100 mg total) by mouth at bedtime as needed for sleep.   [name] 100 UNIT/ML FlexTouch Pen Generic drug: insulin degludec Inject 15 Units into the skin daily.   Trulicity 1.5 VH/8.4ON Sopn Generic drug: Dulaglutide INJECT 1.5MG   SUBCUTANEOUSLY ONCE A WEEK        Objective:   BP (!) 149/73   Pulse 67   Temp 98 F (36.7 C)   Ht 5\' 9"  (1.753 m)   SpO2 97%   BMI 22.46 kg/m   Wt Readings from Last 3 Encounters:  01/07/20 152 lb 1.9 oz (69 kg)  12/23/19 145 lb (65.8 kg)  08/10/19 150 lb (68 kg)    Physical Exam Vitals and nursing note reviewed.  Constitutional:      General: She is not in acute distress.    Appearance: She is well-developed and well-nourished. She is not diaphoretic.  Eyes:     Extraocular Movements: EOM normal.     Conjunctiva/sclera: Conjunctivae normal.  Cardiovascular:     Rate and Rhythm: Normal rate and regular rhythm.     Pulses: Intact distal pulses.     Heart sounds: Normal heart sounds. No murmur heard.   Pulmonary:     Effort: Pulmonary effort is normal. No respiratory distress.     Breath sounds: Normal breath sounds. No wheezing.  Musculoskeletal:        General: No tenderness or edema. Normal range of motion.  Skin:    General: Skin is warm and dry.     Findings: Rash present.  Neurological:     Mental Status: She is alert and oriented to person, place, and time.     Coordination: Coordination normal.  Psychiatric:        Mood and Affect: Mood and affect normal.        Behavior: Behavior normal.       Assessment & Plan:   Problem List Items Addressed This Visit   None   Visit Diagnoses    Altered mental status, unspecified altered  mental status type    -  Primary   Relevant Orders   CBC with Differential/Platelet   CMP14+EGFR   Agitation       Relevant Orders   CBC with Differential/Platelet   CMP14+EGFR   Vaginal discharge       Relevant Orders   Cervicovaginal ancillary only   CBC with Differential/Platelet   CMP14+EGFR    Patient has had confusion, concerns about possible infection, she does not make  urine enough to leave a sample for [place].  She does have some vaginal irritation recommended some topical Monistat for that and will send the vaginal swab.  We will do CBC and CHEM panel to look for any electrolyte or infectious abnormalities.  Follow up plan: Return if symptoms worsen or fail to improve.  Counseling provided for all of the vaccine components No orders of the defined types were placed in this encounter.   Caryl Pina, MD [facility] 02/19/2020, 1:12 PM

## 2020-02-20 ENCOUNTER — Telehealth: Payer: Self-pay | Admitting: *Deleted

## 2020-02-20 DIAGNOSIS — R4182 Altered mental status, unspecified: Secondary | ICD-10-CM

## 2020-02-20 DIAGNOSIS — R451 Restlessness and agitation: Secondary | ICD-10-CM

## 2020-02-20 LAB — CMP14+EGFR
ALT: 19 IU/L (ref 0–32)
AST: 29 IU/L (ref 0–40)
Albumin/Globulin Ratio: 0.9 — ABNORMAL LOW (ref 1.2–2.2)
Albumin: 3.3 g/dL — ABNORMAL LOW (ref 3.8–4.8)
Alkaline Phosphatase: 112 IU/L (ref 44–121)
BUN/Creatinine Ratio: 4 — ABNORMAL LOW (ref 12–28)
BUN: 28 mg/dL — ABNORMAL HIGH (ref 8–27)
Bilirubin Total: 0.3 mg/dL (ref 0.0–1.2)
CO2: 19 mmol/L — ABNORMAL LOW (ref 20–29)
Calcium: 8.7 mg/dL (ref 8.7–10.3)
Chloride: 99 mmol/L (ref 96–106)
Creatinine, Ser: 7.32 mg/dL (ref 0.57–1.00)
GFR calc Af Amer: 6 mL/min/{1.73_m2} — ABNORMAL LOW (ref >59)
GFR calc non Af Amer: 5 mL/min/{1.73_m2} — ABNORMAL LOW (ref >59)
Globulin, Total: 3.5 g/dL (ref 1.5–4.5)
Glucose: 162 mg/dL — ABNORMAL HIGH (ref 65–99)
Potassium: 4.8 mmol/L (ref 3.5–5.2)
Sodium: 143 mmol/L (ref 134–144)
Total Protein: 6.8 g/dL (ref 6.0–8.5)

## 2020-02-20 NOTE — Telephone Encounter (Signed)
Approved today Effective from 02/20/2020 through 02/19/2021.  WM aware

## 2020-02-20 NOTE — Telephone Encounter (Signed)
Key: [id number]  Hydroxyzine HCL 25 mg tab   Sent to [facility] today

## 2020-02-23 LAB — CERVICOVAGINAL ANCILLARY ONLY
Bacterial Vaginitis (gardnerella): POSITIVE — AB
Candida Glabrata: NEGATIVE
Candida Vaginitis: NEGATIVE
Comment: NEGATIVE
Comment: NEGATIVE
Comment: NEGATIVE

## 2020-02-24 ENCOUNTER — Ambulatory Visit: Payer: Medicare Other | Admitting: Family

## 2020-02-24 ENCOUNTER — Encounter: Payer: Self-pay | Admitting: Family Medicine

## 2020-03-03 ENCOUNTER — Telehealth: Payer: Self-pay

## 2020-03-03 NOTE — Telephone Encounter (Signed)
Pt's daughter is concerned that it may not be anxiety and that it may be early signs of dementia. She would like her to have MMSE at her visit 03/16/20. Note added to the appt comments.

## 2020-03-03 NOTE — Telephone Encounter (Signed)
Pt's daughter called stating that pt has had several anxiety attacks this week and wants to speak with a nurse about it.  Please call Morey Hummingbird at [phone]

## 2020-03-03 NOTE — Telephone Encounter (Signed)
lmtcb

## 2020-03-07 ENCOUNTER — Telehealth: Payer: Self-pay | Admitting: Family Medicine

## 2020-03-07 MED ORDER — METRONIDAZOLE 500 MG PO TABS: 500.0000 mg | ORAL_TABLET | Freq: Two times a day (BID) | ORAL | 0 refills | Status: DC

## 2020-03-07 NOTE — Telephone Encounter (Signed)
I do not know if this lady was ever treated because it was off on vacation for a while, and it looks like she had bacterial vaginosis and will send metronidazole for her.

## 2020-03-08 NOTE — Telephone Encounter (Signed)
Patient aware.

## 2020-03-16 ENCOUNTER — Other Ambulatory Visit: Payer: Self-pay

## 2020-03-16 ENCOUNTER — Encounter: Payer: Self-pay | Admitting: Family Medicine

## 2020-03-16 ENCOUNTER — Ambulatory Visit (INDEPENDENT_AMBULATORY_CARE_PROVIDER_SITE_OTHER): Payer: Medicare Other | Admitting: Family Medicine

## 2020-03-16 VITALS — BP 134/61 | HR 60 | Temp 97.8°F | Ht 69.0 in | Wt 157.0 lb

## 2020-03-16 DIAGNOSIS — F321 Major depressive disorder, single episode, moderate: Secondary | ICD-10-CM | POA: Diagnosis not present

## 2020-03-16 DIAGNOSIS — N186 End stage renal disease: Secondary | ICD-10-CM | POA: Diagnosis not present

## 2020-03-16 DIAGNOSIS — Z992 Dependence on renal dialysis: Secondary | ICD-10-CM | POA: Diagnosis not present

## 2020-03-16 MED ORDER — MIRTAZAPINE 30 MG PO TABS: 30.0000 mg | ORAL_TABLET | Freq: Every day | ORAL | 2 refills | Status: DC

## 2020-03-16 NOTE — Progress Notes (Signed)
Subjective:  Patient ID: [name], female    DOB: 02/20/1956  Age: 65 y.o. MRN: [id number]  CC: Anxiety and Memory Loss   HPI MEYLIN VISONE presents for increasing anxiety.  She is not sleeping well.  Feels weak after her dialysis and comes home and wants to rest but has to get right back up because she is too anxious.  GAD, PHQ, MMSE are noted below and reviewed with the patient.  GAD 7 : Generalized Anxiety Score 03/16/2020 02/19/2020  Nervous, Anxious, on Edge 2 3  Control/stop worrying 1 3  Worry too much - different things 1 3  Trouble relaxing 2 3  Restless 2 1  Easily annoyed or irritable 0 0  Afraid - awful might happen 0 0  Total GAD 7 Score 8 13  Anxiety Difficulty Somewhat difficult -      Depression screen [facility] 2/9 03/16/2020 02/19/2020 02/19/2020  Decreased Interest 2 3 0  Down, Depressed, Hopeless 0 1 0  PHQ - 2 Score 2 4 0  Altered sleeping 2 3 -  Tired, decreased energy 2 3 -  Change in appetite 1 3 -  Feeling bad or failure about yourself  0 1 -  Trouble concentrating 1 0 -  Moving slowly or fidgety/restless 0 0 -  Suicidal thoughts 0 0 -  PHQ-9 Score 8 14 -  Difficult doing work/chores Somewhat difficult - -   mini-mental status exam  MMSE - Mini Mental State Exam 03/16/2020  Orientation to time 5  Orientation to Place 4  Registration 3  Attention/ Calculation 3  Recall 3  Language- name 2 objects 2  Language- repeat 1  Language- follow 3 step command 3  Language- read & follow direction 1  Write a sentence 1  Copy design 1  Total score 27     History Qamar has a past medical history of Abnormal CXR (chest x-ray) (05/25/2019), Abnormal LFTs (liver function tests) (03/11/2018), Acute hypoxemic respiratory failure (HCC) (12/16/2018), Acute on chronic combined systolic and diastolic CHF (congestive heart failure) -EF 40 to 45 % (08/10/2019), Acute respiratory failure with hypoxia (HCC) (12/02/2018), Anemia, Blood transfusion without reported  diagnosis, Cardiovascular disease, Carotid artery stenosis (2008), CHF (congestive heart failure) (HCC), Coronary artery disease, 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   She has a past surgical history that includes Cardiac catheterization (2008); IR Removal Tun Cv Cath W/O FL (11/02/2016); Kidney transplant (Right, 02/23/2009); A/V Fistulagram (N/A, 04/05/2017); A/V SHUNT INTERVENTION (Left, 04/05/2017); A/V Fistulagram (Left, 09/18/2017); and PERIPHERAL VASCULAR BALLOON ANGIOPLASTY (Left, 09/18/2017).   Her family history includes Diabetes in her father and mother; Heart disease in her father; Kidney disease in her mother.She reports that she has never smoked. She has never used smokeless tobacco. She reports that she does not drink alcohol and does not use drugs.    ROS Review of Systems  Constitutional: Negative.   HENT: Negative.   Eyes: Negative for visual disturbance.  Respiratory: Negative for shortness of breath.   Cardiovascular: Negative for chest pain.  Gastrointestinal: Negative for abdominal pain.  Musculoskeletal: Negative for arthralgias.  Psychiatric/Behavioral: Positive for decreased concentration, dysphoric mood and sleep disturbance (Trazodone is not holding her.  She is still not  getting a full night sleep.). The patient is nervous/anxious.     Objective:  BP 134/61   Pulse 60   Temp 97.8 F (36.6 C) (Temporal)   Ht 5\' 9"  (1.753 m)   Wt 157 lb (71.2 kg)   BMI 23.18 kg/m   BP Readings from Last 3 Encounters:  03/16/20 134/61  02/19/20  (!) 149/73  01/13/20 (!) 157/61    Wt Readings from Last 3 Encounters:  03/16/20 157 lb (71.2 kg)  01/07/20 152 lb 1.9 oz (69 kg)  12/23/19 145 lb (65.8 kg)     Physical Exam Constitutional:      General: She is not in acute distress.    Appearance: She is well-developed and well-nourished.  Cardiovascular:     Rate and Rhythm: Normal rate and regular rhythm.  Pulmonary:     Breath sounds: Normal breath sounds.  Skin:    General: Skin is warm and dry.  Neurological:     Mental Status: She is alert and oriented to person, place, and time.  Psychiatric:        Mood and Affect: Mood and affect normal.       Assessment & Plan:   Dezarai was seen today for anxiety and memory loss.  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 For sleep and depression       I am having [place] Poli. Vasallo start on mirtazapine. I am also having her maintain her acetaminophen, sevelamer carbonate, Prograf, Rena-Vite Rx, senna, aspirin, predniSONE, metoCLOPramide, Evaristo Bury FlexTouch, Trulicity, rosuvastatin, cloNIDine, metoprolol succinate, isosorbide mononitrate, traZODone, diclofenac Sodium, naproxen, hydrOXYzine, and metroNIDAZOLE.  Allergies as of 03/16/2020      Reactions   Other Other (See Comments)   Tape Bruises and tears skin, Paper tape tolerated   Tape Other (See Comments)   Bruises and tears skin Paper tape tolerated Bruises and tears skin Paper tape tolerated Bruises and tears skin Paper tape tolerated      Medication List       Accurate as of March 16, 2020 11:20 AM. If you have any questions, ask your nurse or doctor.        acetaminophen 500 MG tablet Commonly known as: TYLENOL Take 500 mg by mouth every 6 (six) hours as  needed for moderate pain or headache.   aspirin 81 MG EC tablet Commonly known as: Aspir-Low Take 1 tablet (81 mg total) by mouth daily with breakfast.   cloNIDine 0.1 MG tablet Commonly known as: CATAPRES Take 1 tablet (0.1 mg total) by mouth daily.   diclofenac Sodium 1 % Gel Commonly known as: Voltaren Apply 2 g topically 4 (four) times daily.   hydrOXYzine 25 MG tablet Commonly known as: ATARAX/VISTARIL Take 1 tablet (25 mg total) by mouth 3 (three) times daily as needed for anxiety.   isosorbide mononitrate 60 MG 24 hr tablet Commonly known as: IMDUR Take 1 tablet (60 mg total) by mouth daily.   metoCLOPramide 5 MG tablet Commonly known as: REGLAN Take 1 tablet (5 mg total) by mouth 4 (four) times daily -  before meals and at bedtime.   metoprolol succinate 25 MG 24 hr tablet Commonly known as: TOPROL-XL Take 1 tablet (25 mg total) by mouth daily.  metroNIDAZOLE 500 MG tablet Commonly known as: FLAGYL Take 1 tablet (500 mg total) by mouth 2 (two) times daily.   mirtazapine 30 MG tablet Commonly known as: Remeron Take 1 tablet (30 mg total) by mouth at bedtime. For sleep and depression Started by: Mechele Claude, MD   naproxen 500 MG tablet Commonly known as: Naprosyn Take 1 tablet (500 mg total) by mouth 2 (two) times daily with a meal.   predniSONE 5 MG tablet Commonly known as: DELTASONE Take 1 tablet (5 mg total) by mouth daily with breakfast.   Prograf 0.5 MG capsule Generic drug: tacrolimus Take 0.5 mg by mouth daily.   Rena-Vite Rx 1 MG Tabs Take 1 tablet by mouth daily.   rosuvastatin 20 MG tablet Commonly known as: CRESTOR Take 1 tablet (20 mg total) by mouth daily.   senna 8.6 MG Tabs tablet Commonly known as: SENOKOT Take 1 tablet (8.6 mg total) by mouth 2 (two) times daily.   sevelamer carbonate 800 MG tablet Commonly known as: RENVELA Take 1,600-3,200 mg by mouth 5 (five) times daily. Patient takes 4 tablets(3200mg ) 3 times a day with  meals and 2 tablets(1600mg ) 2 times a day with snacks   traZODone 50 MG tablet Commonly known as: DESYREL Take 1-2 tablets (50-100 mg total) by mouth at bedtime as needed for sleep.   Evaristo Bury FlexTouch 100 UNIT/ML FlexTouch Pen Generic drug: insulin degludec Inject 15 Units into the skin daily.   Trulicity 1.5 MG/0.5ML Sopn Generic drug: Dulaglutide INJECT 1.5MG   SUBCUTANEOUSLY ONCE A WEEK        Follow-up: Return in about 1 month (around 04/16/2020).  Mechele Claude, M.D.

## 2020-03-19 ENCOUNTER — Telehealth: Payer: Self-pay | Admitting: Family Medicine

## 2020-03-19 NOTE — Telephone Encounter (Signed)
Pt is having sleeping issues, mood swings and anxiety. Started mirtazapine a couple of days ago and they wanted to see if these were side effects of the medication

## 2020-03-19 NOTE — Telephone Encounter (Signed)
Stacks covering provider, please advise

## 2020-03-22 NOTE — Telephone Encounter (Signed)
This will likely need to be discussed a little bit further, please schedule an appointment, preferably with PCP if possible or we are starting medicine.

## 2020-03-30 ENCOUNTER — Telehealth: Payer: Self-pay

## 2020-03-30 ENCOUNTER — Other Ambulatory Visit: Payer: Self-pay

## 2020-03-30 ENCOUNTER — Encounter: Payer: Self-pay | Admitting: Family Medicine

## 2020-03-30 ENCOUNTER — Ambulatory Visit (INDEPENDENT_AMBULATORY_CARE_PROVIDER_SITE_OTHER): Payer: Medicare Other | Admitting: Family Medicine

## 2020-03-30 VITALS — BP 111/60 | HR 60 | Temp 97.7°F | Ht 69.0 in | Wt 158.0 lb

## 2020-03-30 DIAGNOSIS — F418 Other specified anxiety disorders: Secondary | ICD-10-CM

## 2020-03-30 DIAGNOSIS — F331 Major depressive disorder, recurrent, moderate: Secondary | ICD-10-CM

## 2020-03-30 MED ORDER — QUETIAPINE FUMARATE 25 MG PO TABS: 25.0000 mg | ORAL_TABLET | Freq: Every day | ORAL | 1 refills | Status: DC

## 2020-03-30 NOTE — Progress Notes (Signed)
Subjective:  Patient ID: [name], female    DOB: May 11, 1955  Age: 65 y.o. MRN: [id number]  CC: Anxiety   HPI [name] presents for  Continued anxiety and depression. Took mirtazapine for 3 nights. Had hangover, bad dreams so it was DCed. She feels like a burder to her family. Daughter concerned she has dementia.  GAD 7 : Generalized Anxiety Score 03/30/2020 03/16/2020 02/19/2020  Nervous, Anxious, on Edge '2 2 3  '$ Control/stop worrying '2 1 3  '$ Worry too much - different things '2 1 3  '$ Trouble relaxing '2 2 3  '$ Restless '2 2 1  '$ Easily annoyed or irritable 1 0 0  Afraid - awful might happen 1 0 0  Total GAD 7 Score '12 8 13  '$ Anxiety Difficulty Somewhat difficult Somewhat difficult -      Depression screen [facility] 2/9 03/30/2020 03/16/2020 02/19/2020  Decreased Interest '3 2 3  '$ Down, Depressed, Hopeless 0 0 1  PHQ - 2 Score '3 2 4  '$ Altered sleeping '2 2 3  '$ Tired, decreased energy '2 2 3  '$ Change in appetite '1 1 3  '$ Feeling bad or failure about yourself  1 0 1  Trouble concentrating 1 1 0  Moving slowly or fidgety/restless 2 0 0  Suicidal thoughts 0 0 0  PHQ-9 Score '12 8 14  '$ Difficult doing work/chores Somewhat difficult Somewhat difficult -  Some recent data might be hidden    History [name] has a past medical history of Abnormal CXR (chest x-ray) (05/25/2019), Abnormal LFTs (liver function tests) (03/11/2018), Acute hypoxemic respiratory failure ([place]) (12/16/2018), Acute on chronic combined systolic and diastolic CHF (congestive heart failure) -EF 40 to 45 % (08/10/2019), Acute respiratory failure with hypoxia ([facility]) (12/02/2018), Anemia, Blood transfusion without reported diagnosis, Cardiovascular disease, Carotid artery stenosis (2008), CHF (congestive heart failure) ([place]), Coronary artery disease, 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   She has a past surgical history that includes Cardiac catheterization (2008); IR Removal Tun Cv Cath W/O FL (11/02/2016); Kidney transplant (Right, 02/23/2009); A/V Fistulagram (N/A, 04/05/2017); A/V SHUNT INTERVENTION (Left, 04/05/2017); A/V Fistulagram (Left, 09/18/2017); and PERIPHERAL VASCULAR BALLOON ANGIOPLASTY (Left, 09/18/2017).   Her family history includes Diabetes in her father and mother; Heart disease in her father; Kidney disease in her mother.She reports that she has never smoked. She has never used smokeless tobacco. She reports that she does not drink alcohol and does not use drugs.    ROS Review of Systems  Constitutional: Negative.   HENT: Negative.   Eyes: Negative for visual disturbance.  Respiratory: Negative for shortness of breath.   Cardiovascular: Negative for chest pain.  Gastrointestinal: Negative for abdominal pain.  Musculoskeletal: Negative for arthralgias.  Psychiatric/Behavioral: Positive for dysphoric mood and sleep disturbance. The patient is nervous/anxious.     Objective:  BP 111/60   Pulse 60   Temp 97.7 F (36.5 C) (Temporal)   Ht '5\' 9"'$  (1.753 m)   Wt 158 lb (71.7 kg)   BMI 23.33 kg/m   BP Readings from Last 3 Encounters:  03/30/20 111/60  03/16/20 134/61  02/19/20 (!) 149/73    Wt Readings from Last 3 Encounters:  03/30/20 158 lb (71.7 kg)  03/16/20 157 lb (71.2  kg)  01/07/20 152 lb 1.9 oz (69 kg)     Physical Exam Constitutional:      General: She is not in acute distress.    Appearance: She is well-developed and well-nourished.  Cardiovascular:     Rate and Rhythm: Normal rate and regular rhythm.  Pulmonary:     Breath sounds: Normal breath  sounds.  Skin:    General: Skin is warm and dry.  Neurological:     Mental Status: She is alert and oriented to person, place, and time.  Psychiatric:        Mood and Affect: Mood and affect normal.       Assessment & Plan:   [name] was seen today for anxiety.  Diagnoses and all orders for this visit:  Depression with anxiety  Moderate episode of recurrent major depressive disorder ([place])  Other orders -     QUEtiapine (SEROQUEL) 25 MG tablet; Take 1 tablet (25 mg total) by mouth at bedtime.       I am having [name]. [name] start on QUEtiapine. I am also having her maintain her acetaminophen, sevelamer carbonate, Prograf, Rena-Vite Rx, senna, aspirin, predniSONE, metoCLOPramide, [name], Trulicity, rosuvastatin, cloNIDine, metoprolol succinate, isosorbide mononitrate, traZODone, diclofenac Sodium, naproxen, hydrOXYzine, metroNIDAZOLE, and mirtazapine.  Allergies as of 03/30/2020      Reactions   Other Other (See Comments)   Tape Bruises and tears skin, Paper tape tolerated   Tape Other (See Comments)   Bruises and tears skin Paper tape tolerated Bruises and tears skin Paper tape tolerated Bruises and tears skin Paper tape tolerated      Medication List       Accurate as of March 30, 2020 10:01 PM. If you have any questions, ask your nurse or doctor.        acetaminophen 500 MG tablet Commonly known as: TYLENOL Take 500 mg by mouth every 6 (six) hours as needed for moderate pain or headache.   aspirin 81 MG EC tablet Commonly known as: Aspir-Low Take 1 tablet (81 mg total) by mouth daily with breakfast.   cloNIDine 0.1 MG tablet Commonly known as: CATAPRES Take 1 tablet (0.1 mg total) by mouth daily.   diclofenac Sodium 1 % Gel Commonly known as: Voltaren Apply 2 g topically 4 (four) times daily.   hydrOXYzine 25 MG tablet Commonly known as: ATARAX/VISTARIL Take 1 tablet (25 mg total) by mouth 3 (three) times daily as needed for  anxiety.   isosorbide mononitrate 60 MG 24 hr tablet Commonly known as: IMDUR Take 1 tablet (60 mg total) by mouth daily.   metoCLOPramide 5 MG tablet Commonly known as: REGLAN Take 1 tablet (5 mg total) by mouth 4 (four) times daily -  before meals and at bedtime.   metoprolol succinate 25 MG 24 hr tablet Commonly known as: TOPROL-XL Take 1 tablet (25 mg total) by mouth daily.   metroNIDAZOLE 500 MG tablet Commonly known as: FLAGYL Take 1 tablet (500 mg total) by mouth 2 (two) times daily.   mirtazapine 30 MG tablet Commonly known as: Remeron Take 1 tablet (30 mg total) by mouth at bedtime. For sleep and depression   naproxen 500 MG tablet Commonly known as: Naprosyn Take 1 tablet (500 mg total) by mouth 2 (two) times daily with a meal.   predniSONE 5 MG tablet Commonly known as: DELTASONE Take 1 tablet (5 mg total) by mouth daily with breakfast.   Prograf 0.5 MG capsule Generic drug: tacrolimus Take 0.5 mg  by mouth daily.   QUEtiapine 25 MG tablet Commonly known as: SEROQUEL Take 1 tablet (25 mg total) by mouth at bedtime. Started by: Claretta Fraise, MD   Rena-Vite Rx 1 MG Tabs Take 1 tablet by mouth daily.   rosuvastatin 20 MG tablet Commonly known as: CRESTOR Take 1 tablet (20 mg total) by mouth daily.   senna 8.6 MG Tabs tablet Commonly known as: SENOKOT Take 1 tablet (8.6 mg total) by mouth 2 (two) times daily.   sevelamer carbonate 800 MG tablet Commonly known as: RENVELA Take 1,600-3,200 mg by mouth 5 (five) times daily. Patient takes 4 tablets('3200mg'$ ) 3 times a day with meals and 2 tablets('1600mg'$ ) 2 times a day with snacks   traZODone 50 MG tablet Commonly known as: DESYREL Take 1-2 tablets (50-100 mg total) by mouth at bedtime as needed for sleep.   [name] 100 UNIT/ML FlexTouch Pen Generic drug: insulin degludec Inject 15 Units into the skin daily.   Trulicity 1.5 0000000 Sopn Generic drug: Dulaglutide INJECT 1.'5MG'$   SUBCUTANEOUSLY  ONCE A WEEK        Follow-up: Return in about 1 month (around 04/30/2020).  Claretta Fraise, M.D.

## 2020-03-30 NOTE — Telephone Encounter (Signed)
Quetiapine sent to [facility] today.  Major Depressive Disorder  Key: [id number]

## 2020-03-31 NOTE — Telephone Encounter (Signed)
Approvedtoday Effective from 03/30/2020 through 03/30/2021.  Pharmacy aware.

## 2020-04-12 ENCOUNTER — Telehealth: Payer: Self-pay

## 2020-04-12 NOTE — Telephone Encounter (Signed)
Pts daughter called stating that pt is still having very bad anxiety/panic attacks and says pt is taking her medicines like she is supposed to but it doesn't seem to be helping. Says pt had an attack this morning and almost fell.  Pt currently has an appt to see Dr Livia Snellen on 2/17 but daughter wants to know if Dr Livia Snellen can see pt sooner/ASAP?  Please advise and call pt's daughter.

## 2020-04-12 NOTE — Telephone Encounter (Signed)
Spoke with daughter, appointment scheduled tomorrow with Dr. Livia Snellen.

## 2020-04-13 ENCOUNTER — Encounter: Payer: Self-pay | Admitting: Family Medicine

## 2020-04-13 ENCOUNTER — Other Ambulatory Visit: Payer: Self-pay

## 2020-04-13 ENCOUNTER — Telehealth: Payer: Self-pay | Admitting: *Deleted

## 2020-04-13 ENCOUNTER — Ambulatory Visit (INDEPENDENT_AMBULATORY_CARE_PROVIDER_SITE_OTHER): Payer: Medicare Other | Admitting: Family Medicine

## 2020-04-13 VITALS — BP 112/60 | HR 67 | Temp 97.7°F | Ht 69.0 in | Wt 157.2 lb

## 2020-04-13 DIAGNOSIS — R002 Palpitations: Secondary | ICD-10-CM | POA: Diagnosis not present

## 2020-04-13 DIAGNOSIS — F411 Generalized anxiety disorder: Secondary | ICD-10-CM

## 2020-04-13 NOTE — Progress Notes (Signed)
Subjective:  Patient ID: [name], female    DOB: 27-Jul-1955  Age: 65 y.o. MRN: [id number]  CC: Anxiety   HPI YUDI PELCZAR presents for multiple palpitation episodes.  Each is short-lived.  She has not had any chest pain with him.  It occurred just before dialysis.  She denies anxiety.  However there is concern for panic disorder by her daughter who accompanies her today.  Depression screen [facility] 2/9 04/13/2020 03/30/2020 03/16/2020  Decreased Interest '3 3 2  '$ Down, Depressed, Hopeless 1 0 0  PHQ - 2 Score '4 3 2  '$ Altered sleeping '3 2 2  '$ Tired, decreased energy '3 2 2  '$ Change in appetite '3 1 1  '$ Feeling bad or failure about yourself  2 1 0  Trouble concentrating '2 1 1  '$ Moving slowly or fidgety/restless 3 2 0  Suicidal thoughts 0 0 0  PHQ-9 Score '20 12 8  '$ Difficult doing work/chores Somewhat difficult Somewhat difficult Somewhat difficult  Some recent data might be hidden    History Dewey has a past medical history of Abnormal CXR (chest x-ray) (05/25/2019), Abnormal LFTs (liver function tests) (03/11/2018), Acute hypoxemic respiratory failure ([place]) (12/16/2018), Acute on chronic combined systolic and diastolic CHF (congestive heart failure) -EF 40 to 45 % (08/10/2019), Acute respiratory failure with hypoxia ([place]) (12/02/2018), Anemia, Blood transfusion without reported diagnosis, Cardiovascular disease, Carotid artery stenosis (2008), CHF (congestive heart failure) ([place]), Coronary artery disease, 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   She has a past  surgical history that includes Cardiac catheterization (2008); IR Removal Tun Cv Cath W/O FL (11/02/2016); Kidney transplant (Right, 02/23/2009); A/V Fistulagram (N/A, 04/05/2017); A/V SHUNT INTERVENTION (Left, 04/05/2017); A/V Fistulagram (Left, 09/18/2017); and PERIPHERAL VASCULAR BALLOON ANGIOPLASTY (Left, 09/18/2017).   Her family history includes Diabetes in her father and mother; Heart disease in her father; Kidney disease in her mother.She reports that she has never smoked. She has never used smokeless tobacco. She reports that she does not drink alcohol and does not use drugs.    ROS Review of Systems  Constitutional: Negative.   HENT: Negative.   Eyes: Negative for visual disturbance.  Respiratory: Negative for shortness of breath.   Cardiovascular: Negative for chest pain.  Gastrointestinal: Negative for abdominal pain.  Musculoskeletal: Negative for arthralgias.    Objective:  BP 112/60   Pulse 67   Temp 97.7 F (36.5 C) (Temporal)   Ht '5\' 9"'$  (1.753 m)   Wt 157 lb 3.2 oz (71.3 kg)   BMI 23.21 kg/m   BP Readings from Last 3 Encounters:  04/13/20 112/60  03/30/20 111/60  03/16/20 134/61    Wt Readings from Last 3 Encounters:  04/13/20 157 lb 3.2 oz (71.3 kg)  03/30/20 158 lb (71.7 kg)  03/16/20 157 lb (71.2 kg)     Physical Exam Constitutional:      General: She is not in acute distress.    Appearance: She is well-developed.  HENT:     Head: Normocephalic and atraumatic.  Eyes:     Conjunctiva/sclera: Conjunctivae normal.     Pupils: Pupils are equal, round, and reactive to light.  Neck:     Thyroid: No thyromegaly.  Cardiovascular:     Rate  and Rhythm: Normal rate and regular rhythm.     Heart sounds: Normal heart sounds. No murmur heard.   Pulmonary:     Effort: Pulmonary effort is normal. No respiratory distress.     Breath sounds: Normal breath sounds. No wheezing or rales.  Abdominal:     General: Bowel sounds are normal. There is no distension.      Palpations: Abdomen is soft.     Tenderness: There is no abdominal tenderness.  Musculoskeletal:        General: Normal range of motion.     Cervical back: Normal range of motion and neck supple.  Lymphadenopathy:     Cervical: No cervical adenopathy.  Skin:    General: Skin is warm and dry.  Neurological:     Mental Status: She is alert and oriented to person, place, and time.  Psychiatric:        Behavior: Behavior normal.        Thought Content: Thought content normal.        Judgment: Judgment normal.       Assessment & Plan:   Yitta was seen today for anxiety.  Diagnoses and all orders for this visit:  Palpitations  GAD (generalized anxiety disorder)       I have discontinued Marlowe Kays R. Gorden's metroNIDAZOLE. I am also having her maintain her acetaminophen, sevelamer carbonate, Prograf, Rena-Vite Rx, senna, aspirin, predniSONE, metoCLOPramide, [name], Trulicity, rosuvastatin, cloNIDine, metoprolol succinate, isosorbide mononitrate, traZODone, diclofenac Sodium, naproxen, hydrOXYzine, mirtazapine, and QUEtiapine.  Allergies as of 04/13/2020      Reactions   Other Other (See Comments)   Tape Bruises and tears skin, Paper tape tolerated   Tape Other (See Comments)   Bruises and tears skin Paper tape tolerated Bruises and tears skin Paper tape tolerated Bruises and tears skin Paper tape tolerated      Medication List       Accurate as of April 13, 2020 11:59 PM. If you have any questions, ask your nurse or doctor.        STOP taking these medications   metroNIDAZOLE 500 MG tablet Commonly known as: FLAGYL Stopped by: Claretta Fraise, MD     TAKE these medications   acetaminophen 500 MG tablet Commonly known as: TYLENOL Take 500 mg by mouth every 6 (six) hours as needed for moderate pain or headache.   aspirin 81 MG EC tablet Commonly known as: Aspir-Low Take 1 tablet (81 mg total) by mouth daily with breakfast.   cloNIDine 0.1 MG  tablet Commonly known as: CATAPRES Take 1 tablet (0.1 mg total) by mouth daily.   diclofenac Sodium 1 % Gel Commonly known as: Voltaren Apply 2 g topically 4 (four) times daily.   hydrOXYzine 25 MG tablet Commonly known as: ATARAX/VISTARIL Take 1 tablet (25 mg total) by mouth 3 (three) times daily as needed for anxiety.   isosorbide mononitrate 60 MG 24 hr tablet Commonly known as: IMDUR Take 1 tablet (60 mg total) by mouth daily.   metoCLOPramide 5 MG tablet Commonly known as: REGLAN Take 1 tablet (5 mg total) by mouth 4 (four) times daily -  before meals and at bedtime.   metoprolol succinate 25 MG 24 hr tablet Commonly known as: TOPROL-XL Take 1 tablet (25 mg total) by mouth daily.   mirtazapine 30 MG tablet Commonly known as: Remeron Take 1 tablet (30 mg total) by mouth at bedtime. For sleep and depression   naproxen 500 MG tablet Commonly known as: Naprosyn  Take 1 tablet (500 mg total) by mouth 2 (two) times daily with a meal.   predniSONE 5 MG tablet Commonly known as: DELTASONE Take 1 tablet (5 mg total) by mouth daily with breakfast.   Prograf 0.5 MG capsule Generic drug: tacrolimus Take 0.5 mg by mouth daily.   QUEtiapine 25 MG tablet Commonly known as: SEROQUEL Take 1 tablet (25 mg total) by mouth at bedtime.   Rena-Vite Rx 1 MG Tabs Take 1 tablet by mouth daily.   rosuvastatin 20 MG tablet Commonly known as: CRESTOR Take 1 tablet (20 mg total) by mouth daily.   senna 8.6 MG Tabs tablet Commonly known as: SENOKOT Take 1 tablet (8.6 mg total) by mouth 2 (two) times daily.   sevelamer carbonate 800 MG tablet Commonly known as: RENVELA Take 1,600-3,200 mg by mouth 5 (five) times daily. Patient takes 4 tablets('3200mg'$ ) 3 times a day with meals and 2 tablets('1600mg'$ ) 2 times a day with snacks   traZODone 50 MG tablet Commonly known as: DESYREL Take 1-2 tablets (50-100 mg total) by mouth at bedtime as needed for sleep.   [name] 100 UNIT/ML  FlexTouch Pen Generic drug: insulin degludec Inject 15 Units into the skin daily.   Trulicity 1.5 0000000 Sopn Generic drug: Dulaglutide INJECT 1.'5MG'$   SUBCUTANEOUSLY ONCE A WEEK        Follow-up: No follow-ups on file.  Claretta Fraise, M.D.

## 2020-04-13 NOTE — Telephone Encounter (Signed)
Transition Care Management Unsuccessful Follow-up Telephone Call  Date of discharge and from where:  04/12/2020 [facility] ED  Attempts:  1st Attempt  Reason for unsuccessful TCM follow-up call:  Unable to leave message - no VM

## 2020-04-14 NOTE — Telephone Encounter (Signed)
Transition Care Management Unsuccessful Follow-up Telephone Call  Date of discharge and from where:  04/12/2020 [facility] ED  Attempts:  2nd Attempt  Reason for unsuccessful TCM follow-up call:  Unable to leave message - no VM

## 2020-04-15 NOTE — Telephone Encounter (Signed)
Transition Care Management Follow-up Telephone Call  Date of discharge and from where: 04/12/2020 [facility] ED  How have you been since you were released from the hospital? "About the same"  Any questions or concerns? No  Items Reviewed:  Did the pt receive and understand the discharge instructions provided? Yes   Medications obtained and verified? Yes   Other? N/A  Any new allergies since your discharge? No   Dietary orders reviewed? Yes  Do you have support at home? Yes   Home Care and Equipment/Supplies: Were home health services ordered? not applicable If so, what is the name of the agency? N/A  Has the agency set up a time to come to the patient's home? not applicable Were any new equipment or medical supplies ordered?  No What is the name of the medical supply agency? N/A Were you able to get the supplies/equipment? not applicable Do you have any questions related to the use of the equipment or supplies? No  Functional Questionnaire: (I = Independent and D = Dependent) ADLs: I  Bathing/Dressing- I  Meal Prep- I  Eating- I  Maintaining continence- I  Transferring/Ambulation- I  Managing Meds- I  Follow up appointments reviewed:   PCP Hospital f/u appt confirmed? No  - Patient will call to make appointment if necessary.   [facility] f/u appt confirmed? N/A   Are transportation arrangements needed? N/A  If their condition worsens, is the pt aware to call PCP or go to the Emergency Dept.? Yes  Was the patient provided with contact information for the PCP's office or ED? Yes  Was to pt encouraged to call back with questions or concerns? Yes

## 2020-04-16 ENCOUNTER — Encounter: Payer: Self-pay | Admitting: Family Medicine

## 2020-04-16 MED ORDER — QUETIAPINE FUMARATE 50 MG PO TABS: 50.0000 mg | ORAL_TABLET | Freq: Every day | ORAL | 2 refills | Status: DC

## 2020-04-22 ENCOUNTER — Ambulatory Visit: Payer: Medicare Other | Admitting: Family Medicine

## 2020-04-27 ENCOUNTER — Other Ambulatory Visit: Payer: Self-pay

## 2020-04-27 ENCOUNTER — Ambulatory Visit (INDEPENDENT_AMBULATORY_CARE_PROVIDER_SITE_OTHER): Payer: Medicare Other | Admitting: Family Medicine

## 2020-04-27 ENCOUNTER — Encounter: Payer: Self-pay | Admitting: Family Medicine

## 2020-04-27 VITALS — BP 134/51 | HR 60 | Temp 98.3°F | Resp 20 | Ht 69.0 in | Wt 160.0 lb

## 2020-04-27 DIAGNOSIS — F411 Generalized anxiety disorder: Secondary | ICD-10-CM

## 2020-04-27 DIAGNOSIS — E1165 Type 2 diabetes mellitus with hyperglycemia: Secondary | ICD-10-CM | POA: Diagnosis not present

## 2020-04-27 DIAGNOSIS — R002 Palpitations: Secondary | ICD-10-CM

## 2020-04-27 DIAGNOSIS — Z794 Long term (current) use of insulin: Secondary | ICD-10-CM | POA: Diagnosis not present

## 2020-04-27 MED ORDER — TRULICITY 1.5 MG/0.5ML ~~LOC~~ SOAJ: SUBCUTANEOUS | 0 refills | Status: DC

## 2020-04-27 MED ORDER — ARIPIPRAZOLE 5 MG PO TABS: 5.0000 mg | ORAL_TABLET | Freq: Every day | ORAL | 1 refills | Status: DC

## 2020-04-27 MED ORDER — TRESIBA FLEXTOUCH 100 UNIT/ML ~~LOC~~ SOPN: 15.0000 [IU] | PEN_INJECTOR | Freq: Every day | SUBCUTANEOUS | 2 refills | Status: AC

## 2020-04-27 NOTE — Progress Notes (Signed)
Subjective:  Patient ID: [name], female    DOB: 11-Dec-1955  Age: 65 y.o. MRN: [id number]  CC: Medical Management of Chronic Issues (Follow up from [name] )   HPI [name] presents for follow-up of her palpitations.  She was several days ago at [facility] and told her palpitations were Parkinson's Seroquel.  Unfortunately the medicine had to be discontinued.  However, her palpitations have not diminished.  She is having trouble sleeping and she is anxious and needs some type of relief.  See her depression screen below.    Patient also is being treated for diabetes.  She is having trouble with the cost of her insulin and GLP-1 agonist.  Depression screen [facility] 2/9 04/27/2020 04/13/2020 03/30/2020  Decreased Interest '1 3 3  '$ Down, Depressed, Hopeless 2 1 0  PHQ - 2 Score '3 4 3  '$ Altered sleeping '1 3 2  '$ Tired, decreased energy '2 3 2  '$ Change in appetite '1 3 1  '$ Feeling bad or failure about yourself  '1 2 1  '$ Trouble concentrating '1 2 1  '$ Moving slowly or fidgety/restless '1 3 2  '$ Suicidal thoughts 1 0 0  PHQ-9 Score '11 20 12  '$ Difficult doing work/chores Somewhat difficult Somewhat difficult Somewhat difficult  Some recent data might be hidden    History Mystery has a past medical history of Abnormal CXR (chest x-ray) (05/25/2019), Abnormal LFTs (liver function tests) (03/11/2018), Acute hypoxemic respiratory failure ([place]) (12/16/2018), Acute on chronic combined systolic and diastolic CHF (congestive heart failure) -EF 40 to 45 % (08/10/2019), Acute respiratory failure with hypoxia ([place]) (12/02/2018), Anemia, Blood transfusion without reported diagnosis, Cardiovascular disease, Carotid artery stenosis (2008), CHF (congestive heart failure) ([place]), Coronary artery disease, 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   She has a past surgical history that includes Cardiac catheterization (2008); IR Removal Tun Cv Cath W/O FL (11/02/2016); Kidney transplant (Right, 02/23/2009); A/V Fistulagram (N/A, 04/05/2017); A/V SHUNT INTERVENTION (Left, 04/05/2017); A/V Fistulagram (Left, 09/18/2017); and PERIPHERAL VASCULAR BALLOON ANGIOPLASTY (Left, 09/18/2017).   Her family history includes Diabetes in her father and mother; Heart disease in her father; Kidney disease in her mother.She reports that she has never smoked. She has never used smokeless tobacco. She reports that she does not drink alcohol and does not use drugs.    ROS Review of Systems  Constitutional: Negative.   HENT: Negative.   Eyes: Negative for visual disturbance.  Respiratory: Negative for shortness of breath.   Cardiovascular: Negative for chest pain.  Gastrointestinal: Negative for abdominal pain.  Musculoskeletal: Negative for arthralgias.    Objective:  BP (!) 134/51   Pulse 60   Temp 98.3 F (36.8 C)   Resp 20   Ht '5\' 9"'$  (1.753 m)   Wt 160 lb (72.6 kg)   SpO2 99%   BMI 23.63 kg/m   BP Readings from Last 3 Encounters:  04/27/20 (!) 134/51  04/13/20 112/60  03/30/20 111/60    Wt Readings from Last 3 Encounters:  04/27/20 160 lb (72.6 kg)  04/13/20 157 lb 3.2 oz (71.3 kg)  03/30/20 158 lb (71.7 kg)     Physical Exam Constitutional:      General: She is not in  acute distress.    Appearance: She is well-developed and well-nourished.  Cardiovascular:     Rate and Rhythm: Normal rate and regular rhythm.  Pulmonary:     Breath sounds: Normal breath sounds.  Skin:    General: Skin is warm and dry.  Neurological:     Mental Status: She is alert and oriented to  person, place, and time.  Psychiatric:        Attention and Perception: Attention normal.        Mood and Affect: Mood is anxious and depressed. Affect is blunt.        Speech: Speech normal.        Behavior: Behavior is slowed and withdrawn. Behavior is cooperative.        Cognition and Memory: Cognition normal.       Assessment & Plan:   [name] was seen today for medical management of chronic issues.  Diagnoses and all orders for this visit:  GAD (generalized anxiety disorder) -     ARIPiprazole (ABILIFY) 5 MG tablet; Take 1 tablet (5 mg total) by mouth at bedtime.  Type 2 diabetes mellitus with hyperglycemia, with long-term current use of insulin (HCC) -     insulin degludec (TRESIBA FLEXTOUCH) 100 UNIT/ML FlexTouch Pen; Inject 15 Units into the skin daily. -     Dulaglutide (TRULICITY) 1.5 0000000 SOPN; INJECT 1.'5MG'$   SUBCUTANEOUSLY ONCE A WEEK  Palpitations       I have discontinued [name]'s hydrOXYzine and QUEtiapine. I am also having her start on ARIPiprazole. Additionally, I am having her maintain her acetaminophen, sevelamer carbonate, Prograf, Rena-Vite Rx, senna, aspirin, predniSONE, metoCLOPramide, rosuvastatin, cloNIDine, metoprolol succinate, isosorbide mononitrate, traZODone, diclofenac Sodium, naproxen, mirtazapine, [name], and Trulicity.  Allergies as of 04/27/2020      Reactions   Other Other (See Comments)   Tape Bruises and tears skin, Paper tape tolerated   Tape Other (See Comments)   Bruises and tears skin Paper tape tolerated Bruises and tears skin Paper tape tolerated Bruises and tears skin Paper tape tolerated      Medication List       Accurate as of April 27, 2020  6:18 PM. If you have any questions, ask your nurse or doctor.        STOP taking these medications   hydrOXYzine 25 MG tablet Commonly known as: ATARAX/VISTARIL Stopped by: Sue Fraise, MD   QUEtiapine 50 MG tablet Commonly known as:  SEROQUEL Stopped by: Sue Fraise, MD     TAKE these medications   acetaminophen 500 MG tablet Commonly known as: TYLENOL Take 500 mg by mouth every 6 (six) hours as needed for moderate pain or headache.   ARIPiprazole 5 MG tablet Commonly known as: Abilify Take 1 tablet (5 mg total) by mouth at bedtime. Started by: Sue Fraise, MD   aspirin 81 MG EC tablet Commonly known as: Aspir-Low Take 1 tablet (81 mg total) by mouth daily with breakfast.   cloNIDine 0.1 MG tablet Commonly known as: CATAPRES Take 1 tablet (0.1 mg total) by mouth daily.   diclofenac Sodium 1 % Gel Commonly known as: Voltaren Apply 2 g topically 4 (four) times daily.   isosorbide mononitrate 60 MG 24 hr tablet Commonly known as: IMDUR Take 1 tablet (60 mg total) by mouth daily.   metoCLOPramide 5 MG tablet Commonly known as: REGLAN Take 1 tablet (5 mg total) by mouth 4 (four) times daily -  before meals and at bedtime.   metoprolol  succinate 25 MG 24 hr tablet Commonly known as: TOPROL-XL Take 1 tablet (25 mg total) by mouth daily.   mirtazapine 30 MG tablet Commonly known as: Remeron Take 1 tablet (30 mg total) by mouth at bedtime. For sleep and depression   naproxen 500 MG tablet Commonly known as: Naprosyn Take 1 tablet (500 mg total) by mouth 2 (two) times daily with a meal.   predniSONE 5 MG tablet Commonly known as: DELTASONE Take 1 tablet (5 mg total) by mouth daily with breakfast.   Prograf 0.5 MG capsule Generic drug: tacrolimus Take 0.5 mg by mouth daily.   Rena-Vite Rx 1 MG Tabs Take 1 tablet by mouth daily.   rosuvastatin 20 MG tablet Commonly known as: CRESTOR Take 1 tablet (20 mg total) by mouth daily.   senna 8.6 MG Tabs tablet Commonly known as: SENOKOT Take 1 tablet (8.6 mg total) by mouth 2 (two) times daily.   sevelamer carbonate 800 MG tablet Commonly known as: RENVELA Take 1,600-3,200 mg by mouth 5 (five) times daily. Patient takes 4 tablets('3200mg'$ ) 3 times  a day with meals and 2 tablets('1600mg'$ ) 2 times a day with snacks   traZODone 50 MG tablet Commonly known as: DESYREL Take 1-2 tablets (50-100 mg total) by mouth at bedtime as needed for sleep.   [name] 100 UNIT/ML FlexTouch Pen Generic drug: insulin degludec Inject 15 Units into the skin daily.   Trulicity 1.5 0000000 Sopn Generic drug: Dulaglutide INJECT 1.'5MG'$   SUBCUTANEOUSLY ONCE A WEEK      Patient was given information on patient assistance available through social services over 1 month.  Follow-up: Return in about 1 month (around 05/25/2020).  [name], M.D.

## 2020-04-28 ENCOUNTER — Telehealth: Payer: Self-pay

## 2020-04-28 DIAGNOSIS — F411 Generalized anxiety disorder: Secondary | ICD-10-CM

## 2020-04-28 NOTE — Telephone Encounter (Signed)
TC to [facility], they did receive prescription for Abilify but it requires a PA, send message to the PA pool

## 2020-04-30 NOTE — Telephone Encounter (Signed)
Approved today Effective from 04/30/2020 through 04/30/2021  [facility] aware

## 2020-04-30 NOTE — Telephone Encounter (Signed)
Key: [id number] - Rx #[id number] Need help? Call us at [phone] PA Status for Patients  Status Additional Information Required Drug ARIPiprazole '5MG'$  tablets

## 2020-05-05 DIAGNOSIS — Z992 Dependence on renal dialysis: Secondary | ICD-10-CM | POA: Diagnosis not present

## 2020-05-05 DIAGNOSIS — N186 End stage renal disease: Secondary | ICD-10-CM | POA: Diagnosis not present

## 2020-05-07 DIAGNOSIS — N186 End stage renal disease: Secondary | ICD-10-CM | POA: Diagnosis not present

## 2020-05-07 DIAGNOSIS — Z992 Dependence on renal dialysis: Secondary | ICD-10-CM | POA: Diagnosis not present

## 2020-05-07 DIAGNOSIS — R002 Palpitations: Secondary | ICD-10-CM | POA: Diagnosis not present

## 2020-05-08 DIAGNOSIS — I444 Left anterior fascicular block: Secondary | ICD-10-CM | POA: Diagnosis not present

## 2020-05-08 DIAGNOSIS — I110 Hypertensive heart disease with heart failure: Secondary | ICD-10-CM | POA: Diagnosis not present

## 2020-05-08 DIAGNOSIS — E872 Acidosis: Secondary | ICD-10-CM | POA: Diagnosis not present

## 2020-05-08 DIAGNOSIS — E871 Hypo-osmolality and hyponatremia: Secondary | ICD-10-CM | POA: Diagnosis not present

## 2020-05-08 DIAGNOSIS — I120 Hypertensive chronic kidney disease with stage 5 chronic kidney disease or end stage renal disease: Secondary | ICD-10-CM | POA: Diagnosis not present

## 2020-05-08 DIAGNOSIS — Z794 Long term (current) use of insulin: Secondary | ICD-10-CM | POA: Diagnosis not present

## 2020-05-08 DIAGNOSIS — Z01818 Encounter for other preprocedural examination: Secondary | ICD-10-CM | POA: Diagnosis not present

## 2020-05-08 DIAGNOSIS — R079 Chest pain, unspecified: Secondary | ICD-10-CM | POA: Diagnosis not present

## 2020-05-08 DIAGNOSIS — R001 Bradycardia, unspecified: Secondary | ICD-10-CM | POA: Diagnosis not present

## 2020-05-08 DIAGNOSIS — I25110 Atherosclerotic heart disease of native coronary artery with unstable angina pectoris: Secondary | ICD-10-CM | POA: Diagnosis not present

## 2020-05-08 DIAGNOSIS — I132 Hypertensive heart and chronic kidney disease with heart failure and with stage 5 chronic kidney disease, or end stage renal disease: Secondary | ICD-10-CM | POA: Diagnosis not present

## 2020-05-08 DIAGNOSIS — I5032 Chronic diastolic (congestive) heart failure: Secondary | ICD-10-CM | POA: Diagnosis not present

## 2020-05-08 DIAGNOSIS — I491 Atrial premature depolarization: Secondary | ICD-10-CM | POA: Diagnosis not present

## 2020-05-08 DIAGNOSIS — I5030 Unspecified diastolic (congestive) heart failure: Secondary | ICD-10-CM | POA: Diagnosis not present

## 2020-05-08 DIAGNOSIS — R002 Palpitations: Secondary | ICD-10-CM | POA: Diagnosis not present

## 2020-05-08 DIAGNOSIS — I25119 Atherosclerotic heart disease of native coronary artery with unspecified angina pectoris: Secondary | ICD-10-CM | POA: Diagnosis not present

## 2020-05-08 DIAGNOSIS — Z992 Dependence on renal dialysis: Secondary | ICD-10-CM | POA: Diagnosis not present

## 2020-05-08 DIAGNOSIS — Z9861 Coronary angioplasty status: Secondary | ICD-10-CM | POA: Diagnosis not present

## 2020-05-08 DIAGNOSIS — E876 Hypokalemia: Secondary | ICD-10-CM | POA: Diagnosis not present

## 2020-05-08 DIAGNOSIS — I998 Other disorder of circulatory system: Secondary | ICD-10-CM | POA: Diagnosis not present

## 2020-05-08 DIAGNOSIS — I517 Cardiomegaly: Secondary | ICD-10-CM | POA: Diagnosis not present

## 2020-05-08 DIAGNOSIS — N2581 Secondary hyperparathyroidism of renal origin: Secondary | ICD-10-CM | POA: Diagnosis not present

## 2020-05-08 DIAGNOSIS — D631 Anemia in chronic kidney disease: Secondary | ICD-10-CM | POA: Diagnosis not present

## 2020-05-08 DIAGNOSIS — I459 Conduction disorder, unspecified: Secondary | ICD-10-CM | POA: Diagnosis not present

## 2020-05-08 DIAGNOSIS — Z940 Kidney transplant status: Secondary | ICD-10-CM | POA: Diagnosis not present

## 2020-05-08 DIAGNOSIS — N186 End stage renal disease: Secondary | ICD-10-CM | POA: Diagnosis not present

## 2020-05-08 DIAGNOSIS — E1122 Type 2 diabetes mellitus with diabetic chronic kidney disease: Secondary | ICD-10-CM | POA: Diagnosis not present

## 2020-05-08 DIAGNOSIS — I2510 Atherosclerotic heart disease of native coronary artery without angina pectoris: Secondary | ICD-10-CM | POA: Diagnosis not present

## 2020-05-08 DIAGNOSIS — R918 Other nonspecific abnormal finding of lung field: Secondary | ICD-10-CM | POA: Diagnosis not present

## 2020-05-08 DIAGNOSIS — R9431 Abnormal electrocardiogram [ECG] [EKG]: Secondary | ICD-10-CM | POA: Diagnosis not present

## 2020-05-08 DIAGNOSIS — R0602 Shortness of breath: Secondary | ICD-10-CM | POA: Diagnosis not present

## 2020-05-08 DIAGNOSIS — I214 Non-ST elevation (NSTEMI) myocardial infarction: Secondary | ICD-10-CM | POA: Diagnosis not present

## 2020-05-08 DIAGNOSIS — E119 Type 2 diabetes mellitus without complications: Secondary | ICD-10-CM | POA: Diagnosis not present

## 2020-05-08 DIAGNOSIS — J9811 Atelectasis: Secondary | ICD-10-CM | POA: Diagnosis not present

## 2020-05-08 DIAGNOSIS — Z955 Presence of coronary angioplasty implant and graft: Secondary | ICD-10-CM | POA: Diagnosis not present

## 2020-05-08 DIAGNOSIS — E213 Hyperparathyroidism, unspecified: Secondary | ICD-10-CM | POA: Diagnosis not present

## 2020-05-08 DIAGNOSIS — I200 Unstable angina: Secondary | ICD-10-CM | POA: Diagnosis not present

## 2020-05-08 DIAGNOSIS — I471 Supraventricular tachycardia: Secondary | ICD-10-CM | POA: Diagnosis not present

## 2020-05-08 DIAGNOSIS — Z01810 Encounter for preprocedural cardiovascular examination: Secondary | ICD-10-CM | POA: Diagnosis not present

## 2020-05-08 DIAGNOSIS — I2584 Coronary atherosclerosis due to calcified coronary lesion: Secondary | ICD-10-CM | POA: Diagnosis not present

## 2020-05-08 DIAGNOSIS — T82855A Stenosis of coronary artery stent, initial encounter: Secondary | ICD-10-CM | POA: Diagnosis not present

## 2020-05-08 DIAGNOSIS — Z9989 Dependence on other enabling machines and devices: Secondary | ICD-10-CM | POA: Diagnosis not present

## 2020-05-09 DIAGNOSIS — I471 Supraventricular tachycardia: Secondary | ICD-10-CM | POA: Diagnosis not present

## 2020-05-09 DIAGNOSIS — I2510 Atherosclerotic heart disease of native coronary artery without angina pectoris: Secondary | ICD-10-CM | POA: Diagnosis not present

## 2020-05-09 DIAGNOSIS — E872 Acidosis: Secondary | ICD-10-CM | POA: Diagnosis not present

## 2020-05-09 DIAGNOSIS — N186 End stage renal disease: Secondary | ICD-10-CM | POA: Diagnosis not present

## 2020-05-09 DIAGNOSIS — R002 Palpitations: Secondary | ICD-10-CM | POA: Diagnosis not present

## 2020-05-09 DIAGNOSIS — E876 Hypokalemia: Secondary | ICD-10-CM | POA: Diagnosis not present

## 2020-05-09 DIAGNOSIS — I459 Conduction disorder, unspecified: Secondary | ICD-10-CM | POA: Diagnosis not present

## 2020-05-09 DIAGNOSIS — I120 Hypertensive chronic kidney disease with stage 5 chronic kidney disease or end stage renal disease: Secondary | ICD-10-CM | POA: Diagnosis not present

## 2020-05-09 DIAGNOSIS — Z992 Dependence on renal dialysis: Secondary | ICD-10-CM | POA: Diagnosis not present

## 2020-05-09 DIAGNOSIS — I444 Left anterior fascicular block: Secondary | ICD-10-CM | POA: Diagnosis not present

## 2020-05-09 DIAGNOSIS — E119 Type 2 diabetes mellitus without complications: Secondary | ICD-10-CM | POA: Diagnosis not present

## 2020-05-09 DIAGNOSIS — R001 Bradycardia, unspecified: Secondary | ICD-10-CM | POA: Diagnosis not present

## 2020-05-09 DIAGNOSIS — D631 Anemia in chronic kidney disease: Secondary | ICD-10-CM | POA: Diagnosis not present

## 2020-05-09 DIAGNOSIS — I517 Cardiomegaly: Secondary | ICD-10-CM | POA: Diagnosis not present

## 2020-05-09 DIAGNOSIS — R079 Chest pain, unspecified: Secondary | ICD-10-CM | POA: Diagnosis not present

## 2020-05-09 DIAGNOSIS — Z940 Kidney transplant status: Secondary | ICD-10-CM | POA: Diagnosis not present

## 2020-05-09 DIAGNOSIS — I491 Atrial premature depolarization: Secondary | ICD-10-CM | POA: Diagnosis not present

## 2020-05-09 DIAGNOSIS — I214 Non-ST elevation (NSTEMI) myocardial infarction: Secondary | ICD-10-CM | POA: Diagnosis not present

## 2020-05-09 DIAGNOSIS — I110 Hypertensive heart disease with heart failure: Secondary | ICD-10-CM | POA: Diagnosis not present

## 2020-05-09 DIAGNOSIS — I5030 Unspecified diastolic (congestive) heart failure: Secondary | ICD-10-CM | POA: Diagnosis not present

## 2020-05-10 DIAGNOSIS — Z940 Kidney transplant status: Secondary | ICD-10-CM | POA: Diagnosis not present

## 2020-05-10 DIAGNOSIS — R002 Palpitations: Secondary | ICD-10-CM | POA: Diagnosis not present

## 2020-05-10 DIAGNOSIS — I120 Hypertensive chronic kidney disease with stage 5 chronic kidney disease or end stage renal disease: Secondary | ICD-10-CM | POA: Diagnosis not present

## 2020-05-10 DIAGNOSIS — Z992 Dependence on renal dialysis: Secondary | ICD-10-CM | POA: Diagnosis not present

## 2020-05-10 DIAGNOSIS — N186 End stage renal disease: Secondary | ICD-10-CM | POA: Diagnosis not present

## 2020-05-10 DIAGNOSIS — I5030 Unspecified diastolic (congestive) heart failure: Secondary | ICD-10-CM | POA: Diagnosis not present

## 2020-05-10 DIAGNOSIS — R079 Chest pain, unspecified: Secondary | ICD-10-CM | POA: Diagnosis not present

## 2020-05-10 DIAGNOSIS — I2510 Atherosclerotic heart disease of native coronary artery without angina pectoris: Secondary | ICD-10-CM | POA: Diagnosis not present

## 2020-05-10 DIAGNOSIS — I214 Non-ST elevation (NSTEMI) myocardial infarction: Secondary | ICD-10-CM | POA: Diagnosis not present

## 2020-05-10 DIAGNOSIS — E119 Type 2 diabetes mellitus without complications: Secondary | ICD-10-CM | POA: Diagnosis not present

## 2020-05-10 DIAGNOSIS — I110 Hypertensive heart disease with heart failure: Secondary | ICD-10-CM | POA: Diagnosis not present

## 2020-05-10 DIAGNOSIS — Z794 Long term (current) use of insulin: Secondary | ICD-10-CM | POA: Diagnosis not present

## 2020-05-11 ENCOUNTER — Ambulatory Visit: Payer: Medicare HMO | Admitting: Family Medicine

## 2020-05-11 DIAGNOSIS — Z9861 Coronary angioplasty status: Secondary | ICD-10-CM | POA: Diagnosis not present

## 2020-05-11 DIAGNOSIS — Z992 Dependence on renal dialysis: Secondary | ICD-10-CM | POA: Diagnosis not present

## 2020-05-11 DIAGNOSIS — R002 Palpitations: Secondary | ICD-10-CM | POA: Diagnosis not present

## 2020-05-11 DIAGNOSIS — I214 Non-ST elevation (NSTEMI) myocardial infarction: Secondary | ICD-10-CM | POA: Diagnosis not present

## 2020-05-11 DIAGNOSIS — I2510 Atherosclerotic heart disease of native coronary artery without angina pectoris: Secondary | ICD-10-CM | POA: Diagnosis not present

## 2020-05-11 DIAGNOSIS — R079 Chest pain, unspecified: Secondary | ICD-10-CM | POA: Diagnosis not present

## 2020-05-11 DIAGNOSIS — Z9989 Dependence on other enabling machines and devices: Secondary | ICD-10-CM | POA: Diagnosis not present

## 2020-05-11 DIAGNOSIS — I110 Hypertensive heart disease with heart failure: Secondary | ICD-10-CM | POA: Diagnosis not present

## 2020-05-11 DIAGNOSIS — I5030 Unspecified diastolic (congestive) heart failure: Secondary | ICD-10-CM | POA: Diagnosis not present

## 2020-05-11 DIAGNOSIS — N186 End stage renal disease: Secondary | ICD-10-CM | POA: Diagnosis not present

## 2020-05-12 DIAGNOSIS — R002 Palpitations: Secondary | ICD-10-CM | POA: Diagnosis not present

## 2020-05-12 DIAGNOSIS — Z9861 Coronary angioplasty status: Secondary | ICD-10-CM | POA: Diagnosis not present

## 2020-05-12 DIAGNOSIS — I517 Cardiomegaly: Secondary | ICD-10-CM | POA: Diagnosis not present

## 2020-05-12 DIAGNOSIS — I5030 Unspecified diastolic (congestive) heart failure: Secondary | ICD-10-CM | POA: Diagnosis not present

## 2020-05-12 DIAGNOSIS — I2510 Atherosclerotic heart disease of native coronary artery without angina pectoris: Secondary | ICD-10-CM | POA: Diagnosis not present

## 2020-05-12 DIAGNOSIS — I214 Non-ST elevation (NSTEMI) myocardial infarction: Secondary | ICD-10-CM | POA: Diagnosis not present

## 2020-05-12 DIAGNOSIS — N186 End stage renal disease: Secondary | ICD-10-CM | POA: Diagnosis not present

## 2020-05-12 DIAGNOSIS — Z992 Dependence on renal dialysis: Secondary | ICD-10-CM | POA: Diagnosis not present

## 2020-05-12 DIAGNOSIS — I110 Hypertensive heart disease with heart failure: Secondary | ICD-10-CM | POA: Diagnosis not present

## 2020-05-13 DIAGNOSIS — I214 Non-ST elevation (NSTEMI) myocardial infarction: Secondary | ICD-10-CM | POA: Diagnosis not present

## 2020-05-13 DIAGNOSIS — I517 Cardiomegaly: Secondary | ICD-10-CM | POA: Diagnosis not present

## 2020-05-13 DIAGNOSIS — Z9861 Coronary angioplasty status: Secondary | ICD-10-CM | POA: Diagnosis not present

## 2020-05-13 DIAGNOSIS — R002 Palpitations: Secondary | ICD-10-CM | POA: Diagnosis not present

## 2020-05-13 DIAGNOSIS — I444 Left anterior fascicular block: Secondary | ICD-10-CM | POA: Diagnosis not present

## 2020-05-13 DIAGNOSIS — I491 Atrial premature depolarization: Secondary | ICD-10-CM | POA: Diagnosis not present

## 2020-05-13 DIAGNOSIS — I110 Hypertensive heart disease with heart failure: Secondary | ICD-10-CM | POA: Diagnosis not present

## 2020-05-13 DIAGNOSIS — I2510 Atherosclerotic heart disease of native coronary artery without angina pectoris: Secondary | ICD-10-CM | POA: Diagnosis not present

## 2020-05-13 DIAGNOSIS — I5030 Unspecified diastolic (congestive) heart failure: Secondary | ICD-10-CM | POA: Diagnosis not present

## 2020-05-14 ENCOUNTER — Telehealth: Payer: Self-pay

## 2020-05-14 DIAGNOSIS — N186 End stage renal disease: Secondary | ICD-10-CM | POA: Diagnosis not present

## 2020-05-14 DIAGNOSIS — Z992 Dependence on renal dialysis: Secondary | ICD-10-CM | POA: Diagnosis not present

## 2020-05-14 NOTE — Telephone Encounter (Signed)
Aware will need F2F visit for oxygen order. Will see if Cardiologist can do at her appt on T 3/15, if not we made an appt with Dr. Livia Snellen for Th 3/17

## 2020-05-19 DIAGNOSIS — Z992 Dependence on renal dialysis: Secondary | ICD-10-CM | POA: Diagnosis not present

## 2020-05-19 DIAGNOSIS — N186 End stage renal disease: Secondary | ICD-10-CM | POA: Diagnosis not present

## 2020-05-20 ENCOUNTER — Ambulatory Visit: Payer: Medicare HMO | Admitting: Family Medicine

## 2020-05-21 DIAGNOSIS — N186 End stage renal disease: Secondary | ICD-10-CM | POA: Diagnosis not present

## 2020-05-21 DIAGNOSIS — Z992 Dependence on renal dialysis: Secondary | ICD-10-CM | POA: Diagnosis not present

## 2020-05-24 ENCOUNTER — Telehealth: Payer: Self-pay | Admitting: Family Medicine

## 2020-05-24 ENCOUNTER — Ambulatory Visit: Payer: Medicare HMO | Admitting: Family Medicine

## 2020-05-24 DIAGNOSIS — N186 End stage renal disease: Secondary | ICD-10-CM | POA: Diagnosis not present

## 2020-05-24 DIAGNOSIS — Z992 Dependence on renal dialysis: Secondary | ICD-10-CM | POA: Diagnosis not present

## 2020-05-24 NOTE — Telephone Encounter (Signed)
Pt needs to r/s hospital f/u - made for 05/24/20 but pt is not able to come on Mondays

## 2020-05-24 NOTE — Telephone Encounter (Signed)
Attempted to contact patient - NVM 

## 2020-05-26 DIAGNOSIS — N186 End stage renal disease: Secondary | ICD-10-CM | POA: Diagnosis not present

## 2020-05-26 DIAGNOSIS — Z992 Dependence on renal dialysis: Secondary | ICD-10-CM | POA: Diagnosis not present

## 2020-05-27 DIAGNOSIS — M4854XA Collapsed vertebra, not elsewhere classified, thoracic region, initial encounter for fracture: Secondary | ICD-10-CM | POA: Diagnosis not present

## 2020-05-27 DIAGNOSIS — R1013 Epigastric pain: Secondary | ICD-10-CM | POA: Diagnosis not present

## 2020-05-27 DIAGNOSIS — N261 Atrophy of kidney (terminal): Secondary | ICD-10-CM | POA: Diagnosis not present

## 2020-05-27 DIAGNOSIS — R918 Other nonspecific abnormal finding of lung field: Secondary | ICD-10-CM | POA: Diagnosis not present

## 2020-05-27 DIAGNOSIS — K551 Chronic vascular disorders of intestine: Secondary | ICD-10-CM | POA: Diagnosis not present

## 2020-05-27 DIAGNOSIS — Z992 Dependence on renal dialysis: Secondary | ICD-10-CM | POA: Diagnosis not present

## 2020-05-27 DIAGNOSIS — M8580 Other specified disorders of bone density and structure, unspecified site: Secondary | ICD-10-CM | POA: Diagnosis not present

## 2020-05-27 DIAGNOSIS — I2510 Atherosclerotic heart disease of native coronary artery without angina pectoris: Secondary | ICD-10-CM | POA: Diagnosis not present

## 2020-05-27 DIAGNOSIS — N186 End stage renal disease: Secondary | ICD-10-CM | POA: Diagnosis not present

## 2020-05-27 DIAGNOSIS — R112 Nausea with vomiting, unspecified: Secondary | ICD-10-CM | POA: Diagnosis not present

## 2020-05-27 DIAGNOSIS — N2889 Other specified disorders of kidney and ureter: Secondary | ICD-10-CM | POA: Diagnosis not present

## 2020-05-27 DIAGNOSIS — I700 Atherosclerosis of aorta: Secondary | ICD-10-CM | POA: Diagnosis not present

## 2020-05-28 DIAGNOSIS — Z992 Dependence on renal dialysis: Secondary | ICD-10-CM | POA: Diagnosis not present

## 2020-05-28 DIAGNOSIS — N186 End stage renal disease: Secondary | ICD-10-CM | POA: Diagnosis not present

## 2020-05-30 DIAGNOSIS — E1122 Type 2 diabetes mellitus with diabetic chronic kidney disease: Secondary | ICD-10-CM | POA: Diagnosis not present

## 2020-05-30 DIAGNOSIS — T8619 Other complication of kidney transplant: Secondary | ICD-10-CM | POA: Diagnosis not present

## 2020-05-30 DIAGNOSIS — I491 Atrial premature depolarization: Secondary | ICD-10-CM | POA: Diagnosis not present

## 2020-05-30 DIAGNOSIS — D638 Anemia in other chronic diseases classified elsewhere: Secondary | ICD-10-CM | POA: Diagnosis not present

## 2020-05-30 DIAGNOSIS — I5032 Chronic diastolic (congestive) heart failure: Secondary | ICD-10-CM | POA: Diagnosis not present

## 2020-05-30 DIAGNOSIS — R0602 Shortness of breath: Secondary | ICD-10-CM | POA: Diagnosis not present

## 2020-05-30 DIAGNOSIS — N1330 Unspecified hydronephrosis: Secondary | ICD-10-CM | POA: Diagnosis not present

## 2020-05-30 DIAGNOSIS — I2510 Atherosclerotic heart disease of native coronary artery without angina pectoris: Secondary | ICD-10-CM | POA: Diagnosis not present

## 2020-05-30 DIAGNOSIS — J849 Interstitial pulmonary disease, unspecified: Secondary | ICD-10-CM | POA: Diagnosis not present

## 2020-05-30 DIAGNOSIS — I444 Left anterior fascicular block: Secondary | ICD-10-CM | POA: Diagnosis not present

## 2020-05-30 DIAGNOSIS — R001 Bradycardia, unspecified: Secondary | ICD-10-CM | POA: Diagnosis not present

## 2020-05-30 DIAGNOSIS — Z9861 Coronary angioplasty status: Secondary | ICD-10-CM | POA: Diagnosis not present

## 2020-05-30 DIAGNOSIS — I21A1 Myocardial infarction type 2: Secondary | ICD-10-CM | POA: Diagnosis not present

## 2020-05-30 DIAGNOSIS — Z20820 Contact with and (suspected) exposure to varicella: Secondary | ICD-10-CM | POA: Diagnosis not present

## 2020-05-30 DIAGNOSIS — I120 Hypertensive chronic kidney disease with stage 5 chronic kidney disease or end stage renal disease: Secondary | ICD-10-CM | POA: Diagnosis not present

## 2020-05-30 DIAGNOSIS — N186 End stage renal disease: Secondary | ICD-10-CM | POA: Diagnosis not present

## 2020-05-30 DIAGNOSIS — R5081 Fever presenting with conditions classified elsewhere: Secondary | ICD-10-CM | POA: Diagnosis not present

## 2020-05-30 DIAGNOSIS — I161 Hypertensive emergency: Secondary | ICD-10-CM | POA: Diagnosis not present

## 2020-05-30 DIAGNOSIS — Z20822 Contact with and (suspected) exposure to covid-19: Secondary | ICD-10-CM | POA: Diagnosis not present

## 2020-05-30 DIAGNOSIS — R509 Fever, unspecified: Secondary | ICD-10-CM | POA: Diagnosis not present

## 2020-05-30 DIAGNOSIS — I132 Hypertensive heart and chronic kidney disease with heart failure and with stage 5 chronic kidney disease, or end stage renal disease: Secondary | ICD-10-CM | POA: Diagnosis not present

## 2020-05-30 DIAGNOSIS — Z992 Dependence on renal dialysis: Secondary | ICD-10-CM | POA: Diagnosis not present

## 2020-05-31 DIAGNOSIS — I151 Hypertension secondary to other renal disorders: Secondary | ICD-10-CM | POA: Diagnosis not present

## 2020-05-31 DIAGNOSIS — Z9861 Coronary angioplasty status: Secondary | ICD-10-CM | POA: Diagnosis not present

## 2020-05-31 DIAGNOSIS — N111 Chronic obstructive pyelonephritis: Secondary | ICD-10-CM | POA: Diagnosis not present

## 2020-05-31 DIAGNOSIS — E119 Type 2 diabetes mellitus without complications: Secondary | ICD-10-CM | POA: Diagnosis not present

## 2020-05-31 DIAGNOSIS — R519 Headache, unspecified: Secondary | ICD-10-CM | POA: Diagnosis not present

## 2020-05-31 DIAGNOSIS — D849 Immunodeficiency, unspecified: Secondary | ICD-10-CM | POA: Diagnosis not present

## 2020-05-31 DIAGNOSIS — I2510 Atherosclerotic heart disease of native coronary artery without angina pectoris: Secondary | ICD-10-CM | POA: Diagnosis not present

## 2020-05-31 DIAGNOSIS — T8613 Kidney transplant infection: Secondary | ICD-10-CM | POA: Diagnosis not present

## 2020-05-31 DIAGNOSIS — Z794 Long term (current) use of insulin: Secondary | ICD-10-CM | POA: Diagnosis not present

## 2020-05-31 DIAGNOSIS — Z940 Kidney transplant status: Secondary | ICD-10-CM | POA: Diagnosis not present

## 2020-05-31 DIAGNOSIS — I132 Hypertensive heart and chronic kidney disease with heart failure and with stage 5 chronic kidney disease, or end stage renal disease: Secondary | ICD-10-CM | POA: Diagnosis not present

## 2020-05-31 DIAGNOSIS — I161 Hypertensive emergency: Secondary | ICD-10-CM | POA: Diagnosis not present

## 2020-05-31 DIAGNOSIS — N186 End stage renal disease: Secondary | ICD-10-CM | POA: Diagnosis not present

## 2020-05-31 DIAGNOSIS — R1031 Right lower quadrant pain: Secondary | ICD-10-CM | POA: Diagnosis not present

## 2020-05-31 DIAGNOSIS — D72829 Elevated white blood cell count, unspecified: Secondary | ICD-10-CM | POA: Diagnosis not present

## 2020-05-31 DIAGNOSIS — Z7952 Long term (current) use of systemic steroids: Secondary | ICD-10-CM | POA: Diagnosis not present

## 2020-05-31 DIAGNOSIS — R0602 Shortness of breath: Secondary | ICD-10-CM | POA: Diagnosis not present

## 2020-05-31 DIAGNOSIS — D638 Anemia in other chronic diseases classified elsewhere: Secondary | ICD-10-CM | POA: Diagnosis not present

## 2020-05-31 DIAGNOSIS — E1122 Type 2 diabetes mellitus with diabetic chronic kidney disease: Secondary | ICD-10-CM | POA: Diagnosis not present

## 2020-05-31 DIAGNOSIS — I21A1 Myocardial infarction type 2: Secondary | ICD-10-CM | POA: Diagnosis not present

## 2020-05-31 DIAGNOSIS — I491 Atrial premature depolarization: Secondary | ICD-10-CM | POA: Diagnosis not present

## 2020-05-31 DIAGNOSIS — I517 Cardiomegaly: Secondary | ICD-10-CM | POA: Diagnosis not present

## 2020-05-31 DIAGNOSIS — I120 Hypertensive chronic kidney disease with stage 5 chronic kidney disease or end stage renal disease: Secondary | ICD-10-CM | POA: Diagnosis not present

## 2020-05-31 DIAGNOSIS — R001 Bradycardia, unspecified: Secondary | ICD-10-CM | POA: Diagnosis not present

## 2020-05-31 DIAGNOSIS — N1330 Unspecified hydronephrosis: Secondary | ICD-10-CM | POA: Diagnosis not present

## 2020-05-31 DIAGNOSIS — E1022 Type 1 diabetes mellitus with diabetic chronic kidney disease: Secondary | ICD-10-CM | POA: Diagnosis not present

## 2020-05-31 DIAGNOSIS — T8619 Other complication of kidney transplant: Secondary | ICD-10-CM | POA: Diagnosis not present

## 2020-05-31 DIAGNOSIS — I160 Hypertensive urgency: Secondary | ICD-10-CM | POA: Diagnosis not present

## 2020-05-31 DIAGNOSIS — R079 Chest pain, unspecified: Secondary | ICD-10-CM | POA: Diagnosis not present

## 2020-05-31 DIAGNOSIS — I5030 Unspecified diastolic (congestive) heart failure: Secondary | ICD-10-CM | POA: Diagnosis not present

## 2020-05-31 DIAGNOSIS — R509 Fever, unspecified: Secondary | ICD-10-CM | POA: Diagnosis not present

## 2020-05-31 DIAGNOSIS — R0600 Dyspnea, unspecified: Secondary | ICD-10-CM | POA: Diagnosis not present

## 2020-05-31 DIAGNOSIS — Z992 Dependence on renal dialysis: Secondary | ICD-10-CM | POA: Diagnosis not present

## 2020-05-31 DIAGNOSIS — Z20822 Contact with and (suspected) exposure to covid-19: Secondary | ICD-10-CM | POA: Diagnosis not present

## 2020-05-31 DIAGNOSIS — T8612 Kidney transplant failure: Secondary | ICD-10-CM | POA: Diagnosis not present

## 2020-05-31 DIAGNOSIS — I5032 Chronic diastolic (congestive) heart failure: Secondary | ICD-10-CM | POA: Diagnosis not present

## 2020-06-01 DIAGNOSIS — R509 Fever, unspecified: Secondary | ICD-10-CM | POA: Diagnosis not present

## 2020-06-01 DIAGNOSIS — I5030 Unspecified diastolic (congestive) heart failure: Secondary | ICD-10-CM | POA: Diagnosis not present

## 2020-06-01 DIAGNOSIS — Z794 Long term (current) use of insulin: Secondary | ICD-10-CM | POA: Diagnosis not present

## 2020-06-01 DIAGNOSIS — R001 Bradycardia, unspecified: Secondary | ICD-10-CM | POA: Diagnosis not present

## 2020-06-01 DIAGNOSIS — N186 End stage renal disease: Secondary | ICD-10-CM | POA: Diagnosis not present

## 2020-06-01 DIAGNOSIS — Z992 Dependence on renal dialysis: Secondary | ICD-10-CM | POA: Diagnosis not present

## 2020-06-01 DIAGNOSIS — Z20822 Contact with and (suspected) exposure to covid-19: Secondary | ICD-10-CM | POA: Diagnosis not present

## 2020-06-01 DIAGNOSIS — I132 Hypertensive heart and chronic kidney disease with heart failure and with stage 5 chronic kidney disease, or end stage renal disease: Secondary | ICD-10-CM | POA: Diagnosis not present

## 2020-06-01 DIAGNOSIS — E1122 Type 2 diabetes mellitus with diabetic chronic kidney disease: Secondary | ICD-10-CM | POA: Diagnosis not present

## 2020-06-01 DIAGNOSIS — T8613 Kidney transplant infection: Secondary | ICD-10-CM | POA: Diagnosis not present

## 2020-06-01 DIAGNOSIS — I517 Cardiomegaly: Secondary | ICD-10-CM | POA: Diagnosis not present

## 2020-06-02 DIAGNOSIS — R509 Fever, unspecified: Secondary | ICD-10-CM | POA: Diagnosis not present

## 2020-06-02 DIAGNOSIS — I132 Hypertensive heart and chronic kidney disease with heart failure and with stage 5 chronic kidney disease, or end stage renal disease: Secondary | ICD-10-CM | POA: Diagnosis not present

## 2020-06-02 DIAGNOSIS — Z20822 Contact with and (suspected) exposure to covid-19: Secondary | ICD-10-CM | POA: Diagnosis not present

## 2020-06-02 DIAGNOSIS — T8613 Kidney transplant infection: Secondary | ICD-10-CM | POA: Diagnosis not present

## 2020-06-02 DIAGNOSIS — Z992 Dependence on renal dialysis: Secondary | ICD-10-CM | POA: Diagnosis not present

## 2020-06-02 DIAGNOSIS — I5030 Unspecified diastolic (congestive) heart failure: Secondary | ICD-10-CM | POA: Diagnosis not present

## 2020-06-02 DIAGNOSIS — Z794 Long term (current) use of insulin: Secondary | ICD-10-CM | POA: Diagnosis not present

## 2020-06-02 DIAGNOSIS — N186 End stage renal disease: Secondary | ICD-10-CM | POA: Diagnosis not present

## 2020-06-02 DIAGNOSIS — E1122 Type 2 diabetes mellitus with diabetic chronic kidney disease: Secondary | ICD-10-CM | POA: Diagnosis not present

## 2020-06-03 DIAGNOSIS — N186 End stage renal disease: Secondary | ICD-10-CM | POA: Diagnosis not present

## 2020-06-03 DIAGNOSIS — Z992 Dependence on renal dialysis: Secondary | ICD-10-CM | POA: Diagnosis not present

## 2020-06-11 DIAGNOSIS — N186 End stage renal disease: Secondary | ICD-10-CM | POA: Diagnosis not present

## 2020-06-11 DIAGNOSIS — Z992 Dependence on renal dialysis: Secondary | ICD-10-CM | POA: Diagnosis not present

## 2020-06-14 ENCOUNTER — Encounter: Payer: Self-pay | Admitting: Nurse Practitioner

## 2020-06-14 ENCOUNTER — Ambulatory Visit (INDEPENDENT_AMBULATORY_CARE_PROVIDER_SITE_OTHER): Payer: Medicare HMO | Admitting: Nurse Practitioner

## 2020-06-14 DIAGNOSIS — Z992 Dependence on renal dialysis: Secondary | ICD-10-CM | POA: Diagnosis not present

## 2020-06-14 DIAGNOSIS — B373 Candidiasis of vulva and vagina: Secondary | ICD-10-CM | POA: Diagnosis not present

## 2020-06-14 DIAGNOSIS — N186 End stage renal disease: Secondary | ICD-10-CM | POA: Diagnosis not present

## 2020-06-14 DIAGNOSIS — Z91199 Patient's noncompliance with other medical treatment and regimen due to unspecified reason: Secondary | ICD-10-CM | POA: Insufficient documentation

## 2020-06-14 DIAGNOSIS — B3731 Acute candidiasis of vulva and vagina: Secondary | ICD-10-CM | POA: Insufficient documentation

## 2020-06-14 DIAGNOSIS — Z5329 Procedure and treatment not carried out because of patient's decision for other reasons: Secondary | ICD-10-CM | POA: Insufficient documentation

## 2020-06-14 MED ORDER — FLUCONAZOLE 150 MG PO TABS: 150.0000 mg | ORAL_TABLET | Freq: Once | ORAL | 0 refills | Status: AC

## 2020-06-14 NOTE — Progress Notes (Signed)
   Virtual Visit  Note Due to COVID-19 pandemic this visit was conducted virtually. This visit type was conducted due to national recommendations for restrictions regarding the COVID-19 Pandemic (e.g. social distancing, sheltering in place) in an effort to limit this patient's exposure and mitigate transmission in our community. All issues noted in this document were discussed and addressed.  A physical exam was not performed with this format.  I connected with [name] on 06/14/20 at 11:30 a.m. by telephone and verified that I am speaking with the correct person using two identifiers. [name] is currently located in the car and daughter is currently with patient during visit. The provider, Ivy Lynn, NP is located in their office at time of visit.  I discussed the limitations, risks, security and privacy concerns of performing an evaluation and management service by telephone and the availability of in person appointments. I also discussed with the patient that there may be a patient responsible charge related to this service. The patient expressed understanding and agreed to proceed.  Vaginal Discharge The patient's primary symptoms include vaginal discharge. The patient's pertinent negatives include no pelvic pain. This is a new problem. The current episode started in the past 7 days. The problem occurs constantly. The problem has been unchanged. The pain is mild. The problem affects both sides. She is not pregnant. Pertinent negatives include no abdominal pain, flank pain, nausea or vomiting. The vaginal discharge was copious. There has been no bleeding. She has not been passing clots. She has not been passing tissue. She has tried nothing for the symptoms. The treatment provided no relief. She is postmenopausal.         Review of Systems  HENT: Negative.   Respiratory: Negative.   Cardiovascular: Negative.   Gastrointestinal: Negative for abdominal pain, nausea and  vomiting.  Genitourinary: Positive for vaginal discharge. Negative for flank pain and pelvic pain.  Skin: Negative.   All other systems reviewed and are negative.    Observations/Objective: Televisit patient did not sound to be in distress.  Assessment and Plan: Patient is reporting new onset vaginal yeast infection.  Patient is a dialysis patient and has a urinary bag that is not functioning properly.  Patient reports burning, redness and vaginal discharge.  Education provided to patient.  Diflucan 150 mg sent to pharmacy.  Advised patient to hold Prograf for 24 hours and while taking Diflucan.  Patient verbalized understanding. Encourage patient to follow-up with worsening unresolved symptoms.  Follow Up Instructions: Follow-up with worsening unresolved symptoms    I discussed the assessment and treatment plan with the patient. The patient was provided an opportunity to ask questions and all were answered. The patient agreed with the plan and demonstrated an understanding of the instructions.   The patient was advised to call back or seek an in-person evaluation if the symptoms worsen or if the condition fails to improve as anticipated.  The above assessment and management plan was discussed with the patient. The patient verbalized understanding of and has agreed to the management plan. Patient is aware to call the clinic if symptoms persist or worsen. Patient is aware when to return to the clinic for a follow-up visit. Patient educated on when it is appropriate to go to the emergency department.   Time call ended: 11:42 AM  I provided 11 minutes of  non face-to-face time during this encounter.    Ivy Lynn, NP

## 2020-06-14 NOTE — Assessment & Plan Note (Signed)
Patient is reporting new onset vaginal yeast infection.  Patient is a dialysis patient and has a urinary bag that is not functioning properly.  Patient reports burning, redness and vaginal discharge.  Education provided to patient.  Diflucan 150 mg sent to pharmacy.  Advised patient to hold Prograf for 24 hours and while taking Diflucan.  Patient verbalized understanding. Encourage patient to follow-up with worsening unresolved symptoms.

## 2020-06-14 NOTE — Patient Instructions (Signed)
Vaginal Yeast Infection, Adult  Vaginal yeast infection is a condition that causes vaginal discharge as well as soreness, swelling, and redness (inflammation) of the vagina. This is a common condition. Some women get this infection frequently. What are the causes? This condition is caused by a change in the normal balance of the yeast (candida) and bacteria that live in the vagina. This change causes an overgrowth of yeast, which causes the inflammation. What increases the risk? The condition is more likely to develop in women who:  Take antibiotic medicines.  Have diabetes.  Take birth control pills.  Are pregnant.  Douche often.  Have a weak body defense system (immune system).  Have been taking steroid medicines for a long time.  Frequently wear tight clothing. What are the signs or symptoms? Symptoms of this condition include:  White, thick, creamy vaginal discharge.  Swelling, itching, redness, and irritation of the vagina. The lips of the vagina (vulva) may be affected as well.  Pain or a burning feeling while urinating.  Pain during sex. How is this diagnosed? This condition is diagnosed based on:  Your medical history.  A physical exam.  A pelvic exam. Your health care provider will examine a sample of your vaginal discharge under a microscope. Your health care provider may send this sample for testing to confirm the diagnosis. How is this treated? This condition is treated with medicine. Medicines may be over-the-counter or prescription. You may be told to use one or more of the following:  Medicine that is taken by mouth (orally).  Medicine that is applied as a cream (topically).  Medicine that is inserted directly into the vagina (suppository). Follow these instructions at home: Lifestyle  Do not have sex until your health care provider approves. Tell your sex partner that you have a yeast infection. That person should go to his or her health care  provider and ask if they should also be treated.  Do not wear tight clothes, such as pantyhose or tight pants.  Wear breathable cotton underwear. General instructions  Take or apply over-the-counter and prescription medicines only as told by your health care provider.  Eat more yogurt. This may help to keep your yeast infection from returning.  Do not use tampons until your health care provider approves.  Try taking a sitz bath to help with discomfort. This is a warm water bath that is taken while you are sitting down. The water should only come up to your hips and should cover your buttocks. Do this 3-4 times per day or as told by your health care provider.  Do not douche.  If you have diabetes, keep your blood sugar levels under control.  Keep all follow-up visits as told by your health care provider. This is important.   Contact a health care provider if:  You have a fever.  Your symptoms go away and then return.  Your symptoms do not get better with treatment.  Your symptoms get worse.  You have new symptoms.  You develop blisters in or around your vagina.  You have blood coming from your vagina and it is not your menstrual period.  You develop pain in your abdomen. Summary  Vaginal yeast infection is a condition that causes discharge as well as soreness, swelling, and redness (inflammation) of the vagina.  This condition is treated with medicine. Medicines may be over-the-counter or prescription.  Take or apply over-the-counter and prescription medicines only as told by your health care provider.  Do not   douche. Do not have sex or use tampons until your health care provider approves.  Contact a health care provider if your symptoms do not get better with treatment or your symptoms go away and then return. This information is not intended to replace advice given to you by your health care provider. Make sure you discuss any questions you have with your health care  provider. Document Revised: 09/20/2018 Document Reviewed: 07/09/2017 Elsevier Patient Education  2021 [employer] Inc.  

## 2020-06-15 ENCOUNTER — Telehealth: Payer: Self-pay

## 2020-06-15 NOTE — Telephone Encounter (Signed)
Physical therapist from [place] calling to let you know patient is refusing all home health and physical therapy at this time.  If you have any questions, please give him a call.

## 2020-06-16 ENCOUNTER — Ambulatory Visit: Payer: Medicare HMO | Admitting: Family Medicine

## 2020-06-16 DIAGNOSIS — N186 End stage renal disease: Secondary | ICD-10-CM | POA: Diagnosis not present

## 2020-06-16 DIAGNOSIS — Z992 Dependence on renal dialysis: Secondary | ICD-10-CM | POA: Diagnosis not present

## 2020-06-18 DIAGNOSIS — R0601 Orthopnea: Secondary | ICD-10-CM | POA: Diagnosis not present

## 2020-06-18 DIAGNOSIS — I2510 Atherosclerotic heart disease of native coronary artery without angina pectoris: Secondary | ICD-10-CM | POA: Diagnosis not present

## 2020-06-18 DIAGNOSIS — N2581 Secondary hyperparathyroidism of renal origin: Secondary | ICD-10-CM | POA: Diagnosis not present

## 2020-06-18 DIAGNOSIS — R0789 Other chest pain: Secondary | ICD-10-CM | POA: Diagnosis not present

## 2020-06-18 DIAGNOSIS — D8489 Other immunodeficiencies: Secondary | ICD-10-CM | POA: Diagnosis not present

## 2020-06-18 DIAGNOSIS — I080 Rheumatic disorders of both mitral and aortic valves: Secondary | ICD-10-CM | POA: Diagnosis not present

## 2020-06-18 DIAGNOSIS — I120 Hypertensive chronic kidney disease with stage 5 chronic kidney disease or end stage renal disease: Secondary | ICD-10-CM | POA: Diagnosis not present

## 2020-06-18 DIAGNOSIS — D62 Acute posthemorrhagic anemia: Secondary | ICD-10-CM | POA: Diagnosis not present

## 2020-06-18 DIAGNOSIS — D72829 Elevated white blood cell count, unspecified: Secondary | ICD-10-CM | POA: Diagnosis not present

## 2020-06-18 DIAGNOSIS — I132 Hypertensive heart and chronic kidney disease with heart failure and with stage 5 chronic kidney disease, or end stage renal disease: Secondary | ICD-10-CM | POA: Diagnosis not present

## 2020-06-18 DIAGNOSIS — I4891 Unspecified atrial fibrillation: Secondary | ICD-10-CM | POA: Diagnosis not present

## 2020-06-18 DIAGNOSIS — T8612 Kidney transplant failure: Secondary | ICD-10-CM | POA: Diagnosis not present

## 2020-06-18 DIAGNOSIS — E1122 Type 2 diabetes mellitus with diabetic chronic kidney disease: Secondary | ICD-10-CM | POA: Diagnosis not present

## 2020-06-18 DIAGNOSIS — R001 Bradycardia, unspecified: Secondary | ICD-10-CM | POA: Diagnosis not present

## 2020-06-18 DIAGNOSIS — D631 Anemia in chronic kidney disease: Secondary | ICD-10-CM | POA: Diagnosis not present

## 2020-06-18 DIAGNOSIS — N1330 Unspecified hydronephrosis: Secondary | ICD-10-CM | POA: Diagnosis not present

## 2020-06-18 DIAGNOSIS — I5030 Unspecified diastolic (congestive) heart failure: Secondary | ICD-10-CM | POA: Diagnosis not present

## 2020-06-18 DIAGNOSIS — Z992 Dependence on renal dialysis: Secondary | ICD-10-CM | POA: Diagnosis not present

## 2020-06-18 DIAGNOSIS — R109 Unspecified abdominal pain: Secondary | ICD-10-CM | POA: Diagnosis not present

## 2020-06-18 DIAGNOSIS — R079 Chest pain, unspecified: Secondary | ICD-10-CM | POA: Diagnosis not present

## 2020-06-18 DIAGNOSIS — I517 Cardiomegaly: Secondary | ICD-10-CM | POA: Diagnosis not present

## 2020-06-18 DIAGNOSIS — Z95818 Presence of other cardiac implants and grafts: Secondary | ICD-10-CM | POA: Diagnosis not present

## 2020-06-18 DIAGNOSIS — E871 Hypo-osmolality and hyponatremia: Secondary | ICD-10-CM | POA: Diagnosis not present

## 2020-06-18 DIAGNOSIS — D649 Anemia, unspecified: Secondary | ICD-10-CM | POA: Diagnosis not present

## 2020-06-18 DIAGNOSIS — Z936 Other artificial openings of urinary tract status: Secondary | ICD-10-CM | POA: Diagnosis not present

## 2020-06-18 DIAGNOSIS — E119 Type 2 diabetes mellitus without complications: Secondary | ICD-10-CM | POA: Diagnosis not present

## 2020-06-18 DIAGNOSIS — E876 Hypokalemia: Secondary | ICD-10-CM | POA: Diagnosis not present

## 2020-06-18 DIAGNOSIS — I491 Atrial premature depolarization: Secondary | ICD-10-CM | POA: Diagnosis not present

## 2020-06-18 DIAGNOSIS — J9601 Acute respiratory failure with hypoxia: Secondary | ICD-10-CM | POA: Diagnosis not present

## 2020-06-18 DIAGNOSIS — I5032 Chronic diastolic (congestive) heart failure: Secondary | ICD-10-CM | POA: Diagnosis not present

## 2020-06-18 DIAGNOSIS — T83022A Displacement of nephrostomy catheter, initial encounter: Secondary | ICD-10-CM | POA: Diagnosis not present

## 2020-06-18 DIAGNOSIS — Z436 Encounter for attention to other artificial openings of urinary tract: Secondary | ICD-10-CM | POA: Diagnosis not present

## 2020-06-18 DIAGNOSIS — E785 Hyperlipidemia, unspecified: Secondary | ICD-10-CM | POA: Diagnosis not present

## 2020-06-18 DIAGNOSIS — N186 End stage renal disease: Secondary | ICD-10-CM | POA: Diagnosis not present

## 2020-06-18 DIAGNOSIS — D638 Anemia in other chronic diseases classified elsewhere: Secondary | ICD-10-CM | POA: Diagnosis not present

## 2020-06-18 DIAGNOSIS — Z20822 Contact with and (suspected) exposure to covid-19: Secondary | ICD-10-CM | POA: Diagnosis not present

## 2020-06-18 DIAGNOSIS — I493 Ventricular premature depolarization: Secondary | ICD-10-CM | POA: Diagnosis not present

## 2020-06-18 DIAGNOSIS — Z940 Kidney transplant status: Secondary | ICD-10-CM | POA: Diagnosis not present

## 2020-06-18 DIAGNOSIS — I444 Left anterior fascicular block: Secondary | ICD-10-CM | POA: Diagnosis not present

## 2020-06-18 DIAGNOSIS — R9431 Abnormal electrocardiogram [ECG] [EKG]: Secondary | ICD-10-CM | POA: Diagnosis not present

## 2020-06-18 DIAGNOSIS — I5020 Unspecified systolic (congestive) heart failure: Secondary | ICD-10-CM | POA: Diagnosis not present

## 2020-06-18 DIAGNOSIS — Z7952 Long term (current) use of systemic steroids: Secondary | ICD-10-CM | POA: Diagnosis not present

## 2020-06-18 DIAGNOSIS — I272 Pulmonary hypertension, unspecified: Secondary | ICD-10-CM | POA: Diagnosis not present

## 2020-06-27 DIAGNOSIS — R079 Chest pain, unspecified: Secondary | ICD-10-CM | POA: Diagnosis not present

## 2020-06-28 DIAGNOSIS — Z992 Dependence on renal dialysis: Secondary | ICD-10-CM | POA: Diagnosis not present

## 2020-06-28 DIAGNOSIS — N186 End stage renal disease: Secondary | ICD-10-CM | POA: Diagnosis not present

## 2020-06-29 ENCOUNTER — Other Ambulatory Visit: Payer: Self-pay

## 2020-06-29 ENCOUNTER — Encounter: Payer: Self-pay | Admitting: Family Medicine

## 2020-06-29 ENCOUNTER — Ambulatory Visit (INDEPENDENT_AMBULATORY_CARE_PROVIDER_SITE_OTHER): Payer: Medicare HMO | Admitting: Family Medicine

## 2020-06-29 VITALS — BP 136/51 | HR 51 | Temp 97.6°F | Ht 69.0 in | Wt 160.0 lb

## 2020-06-29 DIAGNOSIS — F331 Major depressive disorder, recurrent, moderate: Secondary | ICD-10-CM | POA: Insufficient documentation

## 2020-06-29 DIAGNOSIS — T8612 Kidney transplant failure: Secondary | ICD-10-CM | POA: Diagnosis not present

## 2020-06-29 DIAGNOSIS — N1330 Unspecified hydronephrosis: Secondary | ICD-10-CM | POA: Diagnosis not present

## 2020-06-29 DIAGNOSIS — I10 Essential (primary) hypertension: Secondary | ICD-10-CM | POA: Diagnosis not present

## 2020-06-29 DIAGNOSIS — N186 End stage renal disease: Secondary | ICD-10-CM

## 2020-06-29 MED ORDER — SERTRALINE HCL 100 MG PO TABS: 100.0000 mg | ORAL_TABLET | Freq: Every day | ORAL | 3 refills | Status: DC

## 2020-06-29 MED ORDER — TRAZODONE HCL 150 MG PO TABS: 150.0000 mg | ORAL_TABLET | Freq: Every evening | ORAL | 2 refills | Status: DC | PRN

## 2020-06-29 NOTE — Patient Instructions (Signed)
Call Dr. Domenick Bookbinder today at [phone] and let him know that the tube is only draining very small amounts of blood and urine today.

## 2020-06-29 NOTE — Progress Notes (Signed)
Subjective:  Patient ID: [name], female    DOB: 1955/04/20  Age: 65 y.o. MRN: [id number] CC: Hospitalization Follow-up   HPI TOMIKO CARACCIOLO presents for Not sleeping since hospital DC 3 days ago. Admitted at [facility] on 4/15 and DC on 4/23. Feels bad. Doesn 't know why. Busting out crying for no reason. Cries when husband leaves for work. States she feels sad, worried. See PHQ & GAD7 surveys below.    Kidney blocked, Has indwelling catheter. Nephrostomy. Placed by Dr. Shayne Alken during hospitalization. It drained well yesterday,but today it has passed minimal blood and urine. 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   GAD 7 : Generalized Anxiety Score 06/29/2020 04/13/2020 03/30/2020 03/16/2020  Nervous, Anxious, on Edge '3 3 2 2  '$ Control/stop worrying '3 2 2 1  '$ Worry too much - different things '3 2 2 1  '$ Trouble relaxing '3 2 2 2  '$ Restless '3 3 2 2  '$ Easily annoyed or irritable '3 3 1 '$ 0  Afraid - awful might happen '3 3 1 '$ 0  Total GAD 7 Score '21 18 12 8  '$ Anxiety Difficulty Extremely difficult Somewhat difficult Somewhat difficult Somewhat difficult      Depression screen [facility] 2/9 06/29/2020 04/27/2020 04/13/2020  Decreased Interest '3 1 3  '$ Down, Depressed, Hopeless '2 2 1  '$ PHQ - 2 Score '5 3 4  '$ Altered sleeping '3 1 3  '$ Tired, decreased energy '2 2 3  '$ Change in appetite '3 1 3  '$ Feeling bad or failure about yourself  '3 1 2  '$ Trouble concentrating '3 1 2  '$ Moving slowly or fidgety/restless 0 1 3  Suicidal thoughts 2 1 0  PHQ-9 Score '21 11 20  '$ Difficult doing work/chores Extremely dIfficult Somewhat difficult Somewhat difficult  Some recent data might be hidden    History Jerilynn has a past medical history of Abnormal CXR (chest x-ray) (05/25/2019), Abnormal LFTs (liver function tests) (03/11/2018), Acute hypoxemic respiratory failure ([place]) (12/16/2018), Acute on chronic combined systolic and diastolic CHF (congestive heart failure) -EF 40 to 45 % (08/10/2019), Acute respiratory failure with  hypoxia (HCC) (12/02/2018), Anemia, Blood transfusion without reported diagnosis, Cardiovascular disease, Carotid artery stenosis (2008), CHF (congestive heart failure) ([place]), Coronary artery disease, 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   She has a past surgical history that includes Cardiac catheterization (2008); IR Removal Tun Cv Cath W/O FL (11/02/2016); Kidney transplant (Right, 02/23/2009); A/V Fistulagram (N/A, 04/05/2017); A/V SHUNT INTERVENTION (Left, 04/05/2017); A/V Fistulagram (Left, 09/18/2017); and PERIPHERAL VASCULAR BALLOON ANGIOPLASTY (Left, 09/18/2017).   Her family history includes Diabetes in her father and mother; Heart disease in her father; Kidney disease in her mother.She reports that she has never smoked. She has never used smokeless tobacco. She reports that she does not drink alcohol and does not use drugs.    ROS Review of Systems  Constitutional: Positive for activity change, appetite change and fatigue.  HENT: Negative.   Eyes: Negative for visual disturbance.  Respiratory: Positive for shortness of breath.   Cardiovascular: Positive for leg swelling. Negative for chest pain.  Gastrointestinal: Negative for abdominal pain.  Musculoskeletal: Negative for arthralgias.  Neurological: Positive for weakness.  Psychiatric/Behavioral: Positive for dysphoric mood. The patient is  nervous/anxious.     Objective:  BP (!) 136/51   Pulse (!) 51   Temp 97.6 F (36.4 C)   Ht '5\' 9"'$  (1.753 m)   Wt 160 lb (72.6 kg)   SpO2 99%   BMI 23.63 kg/m    BP Readings from Last 3 Encounters:  06/29/20 (!) 136/51  04/27/20 (!) 134/51  04/13/20 112/60    Wt Readings from Last 3 Encounters:  06/29/20 160 lb (72.6 kg)  04/27/20 160 lb (72.6 kg)  04/13/20 157 lb 3.2 oz (71.3 kg)     Physical Exam Constitutional:      General: She is not in acute distress.    Appearance: She is well-developed.  HENT:     Head: Normocephalic and atraumatic.  Eyes:     Conjunctiva/sclera: Conjunctivae normal.     Pupils: Pupils are equal, round, and reactive to light.  Neck:     Thyroid: No thyromegaly.  Cardiovascular:     Rate and Rhythm: Normal rate and regular rhythm.     Heart sounds: Normal heart sounds. No murmur heard.   Pulmonary:     Effort: Pulmonary effort is normal. No respiratory distress.     Breath sounds: Normal breath sounds. No wheezing or rales.  Abdominal:     General: Bowel sounds are normal. There is no distension.     Palpations: Abdomen is soft.     Tenderness: There is no abdominal tenderness.  Musculoskeletal:        General: Normal range of motion.     Cervical back: Normal range of motion and neck supple.  Lymphadenopathy:     Cervical: No cervical adenopathy.  Skin:    General: Skin is warm and dry.  Neurological:     Mental Status: She is alert and oriented to person, place, and time.  Psychiatric:        Attention and Perception: Attention normal.        Mood and Affect: Mood is depressed. Affect is flat.        Speech: Speech is delayed.        Behavior: Behavior is slowed.       Assessment & Plan:   Meleigha was seen today for hospitalization follow-up.  Diagnoses and all orders for this visit:  ESRD (end stage renal disease) ([place])  Hydronephrosis of failed kidney transplant  Essential hypertension  Moderate episode of recurrent major depressive disorder (HCC) -     sertraline (ZOLOFT) 100 MG tablet; Take 1 tablet (100 mg total) by mouth at bedtime. For depression  Other orders -      traZODone (DESYREL) 150 MG tablet; Take 1 tablet (150 mg total) by mouth at bedtime as needed for sleep.       I have discontinued Thurmond Butts. Quizhpi's mirtazapine. I have also changed her traZODone. Additionally, I am having her start on sertraline. Lastly, I am having her maintain her acetaminophen, sevelamer carbonate, Prograf, Rena-Vite Rx, senna, aspirin, predniSONE, metoCLOPramide, rosuvastatin, cloNIDine, metoprolol succinate, isosorbide mononitrate, diclofenac Sodium, naproxen, ARIPiprazole, [name], Trulicity, amiodarone, apixaban, isosorbide mononitrate, pantoprazole, lisinopril, and amLODipine.  Allergies as of 06/29/2020      Reactions   Other Other (See Comments)   Tape Bruises and tears skin, Paper tape tolerated   Tape Other (See Comments)   Bruises and tears skin Paper tape tolerated Bruises and tears skin Paper tape tolerated Bruises and tears skin Paper tape tolerated      Medication List  Accurate as of June 29, 2020 10:10 PM. If you have any questions, ask your nurse or doctor.        STOP taking these medications   mirtazapine 30 MG tablet Commonly known as: Remeron Stopped by: Claretta Fraise, MD     TAKE these medications   acetaminophen 500 MG tablet Commonly known as: TYLENOL Take 500 mg by mouth every 6 (six) hours as needed for moderate pain or headache.   amiodarone 400 MG tablet Commonly known as: PACERONE Take by mouth.   amLODipine 5 MG tablet Commonly known as: NORVASC Hold until follow-up with PCP or cardiology   apixaban 2.5 MG Tabs tablet Commonly known as: ELIQUIS Take by mouth.   ARIPiprazole 5 MG tablet Commonly known as: Abilify Take 1 tablet (5 mg total) by mouth at bedtime.   aspirin 81 MG EC tablet Commonly known as: Aspir-Low Take 1 tablet (81 mg total) by mouth daily with breakfast.   cloNIDine 0.1 MG tablet Commonly known as: CATAPRES Take 1 tablet (0.1 mg total) by mouth daily.   diclofenac Sodium  1 % Gel Commonly known as: Voltaren Apply 2 g topically 4 (four) times daily.   isosorbide mononitrate 60 MG 24 hr tablet Commonly known as: IMDUR Take 1 tablet (60 mg total) by mouth daily.   isosorbide mononitrate 30 MG 24 hr tablet Commonly known as: IMDUR Hold until follow-up with PCP or cardiology  (previous dose: 30 daily)   lisinopril 10 MG tablet Commonly known as: ZESTRIL Hold until follow-up with PCP  (usual dose 10 mg daily)   metoCLOPramide 5 MG tablet Commonly known as: REGLAN Take 1 tablet (5 mg total) by mouth 4 (four) times daily -  before meals and at bedtime. What changed:   when to take this  Another medication with the same name was removed. Continue taking this medication, and follow the directions you see here.   metoprolol succinate 25 MG 24 hr tablet Commonly known as: TOPROL-XL Take 1 tablet (25 mg total) by mouth daily.   naproxen 500 MG tablet Commonly known as: Naprosyn Take 1 tablet (500 mg total) by mouth 2 (two) times daily with a meal.   pantoprazole 40 MG tablet Commonly known as: PROTONIX Take 1 tablet by mouth daily.   predniSONE 5 MG tablet Commonly known as: DELTASONE Take 1 tablet (5 mg total) by mouth daily with breakfast.   Prograf 0.5 MG capsule Generic drug: tacrolimus Take 0.5 mg by mouth daily.   Rena-Vite Rx 1 MG Tabs Take 1 tablet by mouth daily.   rosuvastatin 20 MG tablet Commonly known as: CRESTOR Take 1 tablet (20 mg total) by mouth daily.   senna 8.6 MG Tabs tablet Commonly known as: SENOKOT Take 1 tablet (8.6 mg total) by mouth 2 (two) times daily.   sertraline 100 MG tablet Commonly known as: ZOLOFT Take 1 tablet (100 mg total) by mouth at bedtime. For depression Started by: Claretta Fraise, MD   sevelamer carbonate 800 MG tablet Commonly known as: RENVELA Take 1,600-3,200 mg by mouth 5 (five) times daily. Patient takes 4 tablets('3200mg'$ ) 3 times a day with meals and 2 tablets('1600mg'$ ) 2 times a day with  snacks What changed: Another medication with the same name was removed. Continue taking this medication, and follow the directions you see here. Changed by: Claretta Fraise, MD   traZODone 150 MG tablet Commonly known as: DESYREL Take 1 tablet (150 mg total) by mouth at bedtime as needed for sleep. What changed:  medication strength  how much to take Changed by: Claretta Fraise, MD   [name] 100 UNIT/ML FlexTouch Pen Generic drug: insulin degludec Inject 15 Units into the skin daily.   Trulicity 1.5 0000000 Sopn Generic drug: Dulaglutide INJECT 1.'5MG'$   SUBCUTANEOUSLY ONCE A WEEK        Follow-up: Return in about 2 weeks (around 07/13/2020).  Claretta Fraise, M.D.

## 2020-06-30 DIAGNOSIS — Z992 Dependence on renal dialysis: Secondary | ICD-10-CM | POA: Diagnosis not present

## 2020-06-30 DIAGNOSIS — N186 End stage renal disease: Secondary | ICD-10-CM | POA: Diagnosis not present

## 2020-07-02 DIAGNOSIS — N186 End stage renal disease: Secondary | ICD-10-CM | POA: Diagnosis not present

## 2020-07-02 DIAGNOSIS — Z992 Dependence on renal dialysis: Secondary | ICD-10-CM | POA: Diagnosis not present

## 2020-07-03 DIAGNOSIS — I5032 Chronic diastolic (congestive) heart failure: Secondary | ICD-10-CM | POA: Diagnosis not present

## 2020-07-03 DIAGNOSIS — T8612 Kidney transplant failure: Secondary | ICD-10-CM | POA: Diagnosis not present

## 2020-07-03 DIAGNOSIS — N1330 Unspecified hydronephrosis: Secondary | ICD-10-CM | POA: Diagnosis not present

## 2020-07-03 DIAGNOSIS — I110 Hypertensive heart disease with heart failure: Secondary | ICD-10-CM | POA: Diagnosis not present

## 2020-07-03 DIAGNOSIS — E1122 Type 2 diabetes mellitus with diabetic chronic kidney disease: Secondary | ICD-10-CM | POA: Diagnosis not present

## 2020-07-03 DIAGNOSIS — T83022D Displacement of nephrostomy catheter, subsequent encounter: Secondary | ICD-10-CM | POA: Diagnosis not present

## 2020-07-03 DIAGNOSIS — Z992 Dependence on renal dialysis: Secondary | ICD-10-CM | POA: Diagnosis not present

## 2020-07-03 DIAGNOSIS — N12 Tubulo-interstitial nephritis, not specified as acute or chronic: Secondary | ICD-10-CM | POA: Diagnosis not present

## 2020-07-03 DIAGNOSIS — D631 Anemia in chronic kidney disease: Secondary | ICD-10-CM | POA: Diagnosis not present

## 2020-07-03 DIAGNOSIS — N186 End stage renal disease: Secondary | ICD-10-CM | POA: Diagnosis not present

## 2020-07-05 DIAGNOSIS — Z992 Dependence on renal dialysis: Secondary | ICD-10-CM | POA: Diagnosis not present

## 2020-07-05 DIAGNOSIS — N186 End stage renal disease: Secondary | ICD-10-CM | POA: Diagnosis not present

## 2020-07-06 ENCOUNTER — Telehealth: Payer: Self-pay | Admitting: *Deleted

## 2020-07-06 DIAGNOSIS — R112 Nausea with vomiting, unspecified: Secondary | ICD-10-CM | POA: Diagnosis not present

## 2020-07-06 DIAGNOSIS — N1339 Other hydronephrosis: Secondary | ICD-10-CM | POA: Diagnosis not present

## 2020-07-06 DIAGNOSIS — T8612 Kidney transplant failure: Secondary | ICD-10-CM | POA: Diagnosis not present

## 2020-07-06 DIAGNOSIS — Z936 Other artificial openings of urinary tract status: Secondary | ICD-10-CM | POA: Diagnosis not present

## 2020-07-06 NOTE — Telephone Encounter (Signed)
Per Dr Livia Snellen, patient is to discontinue Metoclopramide and Trazodone due to possible interaction with other medications she is currently taking.   Called patient, no answer

## 2020-07-07 DIAGNOSIS — Z992 Dependence on renal dialysis: Secondary | ICD-10-CM | POA: Diagnosis not present

## 2020-07-07 DIAGNOSIS — N186 End stage renal disease: Secondary | ICD-10-CM | POA: Diagnosis not present

## 2020-07-07 NOTE — Telephone Encounter (Signed)
Patient aware to discontinue meds, is asking what can you replace Trazodone with?

## 2020-07-08 ENCOUNTER — Other Ambulatory Visit: Payer: Self-pay | Admitting: Family Medicine

## 2020-07-08 DIAGNOSIS — I25118 Atherosclerotic heart disease of native coronary artery with other forms of angina pectoris: Secondary | ICD-10-CM | POA: Diagnosis not present

## 2020-07-08 DIAGNOSIS — N186 End stage renal disease: Secondary | ICD-10-CM | POA: Diagnosis not present

## 2020-07-08 DIAGNOSIS — E1122 Type 2 diabetes mellitus with diabetic chronic kidney disease: Secondary | ICD-10-CM | POA: Diagnosis not present

## 2020-07-08 DIAGNOSIS — I454 Nonspecific intraventricular block: Secondary | ICD-10-CM | POA: Diagnosis not present

## 2020-07-08 DIAGNOSIS — I959 Hypotension, unspecified: Secondary | ICD-10-CM | POA: Diagnosis not present

## 2020-07-08 DIAGNOSIS — R9431 Abnormal electrocardiogram [ECG] [EKG]: Secondary | ICD-10-CM | POA: Diagnosis not present

## 2020-07-08 DIAGNOSIS — Z992 Dependence on renal dialysis: Secondary | ICD-10-CM | POA: Diagnosis not present

## 2020-07-08 DIAGNOSIS — E11649 Type 2 diabetes mellitus with hypoglycemia without coma: Secondary | ICD-10-CM | POA: Diagnosis not present

## 2020-07-08 DIAGNOSIS — I5032 Chronic diastolic (congestive) heart failure: Secondary | ICD-10-CM | POA: Diagnosis not present

## 2020-07-08 DIAGNOSIS — I447 Left bundle-branch block, unspecified: Secondary | ICD-10-CM | POA: Diagnosis not present

## 2020-07-08 DIAGNOSIS — R001 Bradycardia, unspecified: Secondary | ICD-10-CM | POA: Diagnosis not present

## 2020-07-08 DIAGNOSIS — T8612 Kidney transplant failure: Secondary | ICD-10-CM | POA: Diagnosis not present

## 2020-07-08 DIAGNOSIS — R42 Dizziness and giddiness: Secondary | ICD-10-CM | POA: Diagnosis not present

## 2020-07-08 DIAGNOSIS — D84821 Immunodeficiency due to drugs: Secondary | ICD-10-CM | POA: Diagnosis not present

## 2020-07-08 DIAGNOSIS — I132 Hypertensive heart and chronic kidney disease with heart failure and with stage 5 chronic kidney disease, or end stage renal disease: Secondary | ICD-10-CM | POA: Diagnosis not present

## 2020-07-08 MED ORDER — APIXABAN 2.5 MG PO TABS: 2.5000 mg | ORAL_TABLET | Freq: Two times a day (BID) | ORAL | 5 refills | Status: AC

## 2020-07-08 MED ORDER — MIRTAZAPINE 15 MG PO TABS: 15.0000 mg | ORAL_TABLET | Freq: Every day | ORAL | 2 refills | Status: DC

## 2020-07-08 MED ORDER — AMIODARONE HCL 400 MG PO TABS: 400.0000 mg | ORAL_TABLET | Freq: Every day | ORAL | 5 refills | Status: DC

## 2020-07-08 NOTE — Telephone Encounter (Signed)
Please let the patient know that I sent their prescription to their pharmacy. Thanks, WS 

## 2020-07-08 NOTE — Telephone Encounter (Signed)
Patient aware.

## 2020-07-09 DIAGNOSIS — I959 Hypotension, unspecified: Secondary | ICD-10-CM | POA: Diagnosis not present

## 2020-07-09 DIAGNOSIS — R9431 Abnormal electrocardiogram [ECG] [EKG]: Secondary | ICD-10-CM | POA: Diagnosis not present

## 2020-07-09 DIAGNOSIS — T8612 Kidney transplant failure: Secondary | ICD-10-CM | POA: Diagnosis not present

## 2020-07-09 DIAGNOSIS — R001 Bradycardia, unspecified: Secondary | ICD-10-CM | POA: Diagnosis not present

## 2020-07-09 DIAGNOSIS — E11649 Type 2 diabetes mellitus with hypoglycemia without coma: Secondary | ICD-10-CM | POA: Diagnosis not present

## 2020-07-09 DIAGNOSIS — D84821 Immunodeficiency due to drugs: Secondary | ICD-10-CM | POA: Diagnosis not present

## 2020-07-09 DIAGNOSIS — I5032 Chronic diastolic (congestive) heart failure: Secondary | ICD-10-CM | POA: Diagnosis not present

## 2020-07-09 DIAGNOSIS — Z992 Dependence on renal dialysis: Secondary | ICD-10-CM | POA: Diagnosis not present

## 2020-07-09 DIAGNOSIS — R42 Dizziness and giddiness: Secondary | ICD-10-CM | POA: Diagnosis not present

## 2020-07-09 DIAGNOSIS — I25118 Atherosclerotic heart disease of native coronary artery with other forms of angina pectoris: Secondary | ICD-10-CM | POA: Diagnosis not present

## 2020-07-09 DIAGNOSIS — N186 End stage renal disease: Secondary | ICD-10-CM | POA: Diagnosis not present

## 2020-07-09 DIAGNOSIS — I132 Hypertensive heart and chronic kidney disease with heart failure and with stage 5 chronic kidney disease, or end stage renal disease: Secondary | ICD-10-CM | POA: Diagnosis not present

## 2020-07-09 DIAGNOSIS — I447 Left bundle-branch block, unspecified: Secondary | ICD-10-CM | POA: Diagnosis not present

## 2020-07-09 DIAGNOSIS — E1122 Type 2 diabetes mellitus with diabetic chronic kidney disease: Secondary | ICD-10-CM | POA: Diagnosis not present

## 2020-07-11 DIAGNOSIS — Z20822 Contact with and (suspected) exposure to covid-19: Secondary | ICD-10-CM | POA: Diagnosis not present

## 2020-07-11 DIAGNOSIS — R319 Hematuria, unspecified: Secondary | ICD-10-CM | POA: Diagnosis not present

## 2020-07-11 DIAGNOSIS — W1839XA Other fall on same level, initial encounter: Secondary | ICD-10-CM | POA: Diagnosis not present

## 2020-07-11 DIAGNOSIS — Z9181 History of falling: Secondary | ICD-10-CM | POA: Diagnosis not present

## 2020-07-11 DIAGNOSIS — I444 Left anterior fascicular block: Secondary | ICD-10-CM | POA: Diagnosis not present

## 2020-07-11 DIAGNOSIS — M542 Cervicalgia: Secondary | ICD-10-CM | POA: Diagnosis not present

## 2020-07-11 DIAGNOSIS — W19XXXA Unspecified fall, initial encounter: Secondary | ICD-10-CM | POA: Diagnosis not present

## 2020-07-11 DIAGNOSIS — K529 Noninfective gastroenteritis and colitis, unspecified: Secondary | ICD-10-CM | POA: Diagnosis not present

## 2020-07-11 DIAGNOSIS — R55 Syncope and collapse: Secondary | ICD-10-CM | POA: Diagnosis not present

## 2020-07-11 DIAGNOSIS — R109 Unspecified abdominal pain: Secondary | ICD-10-CM | POA: Diagnosis not present

## 2020-07-11 DIAGNOSIS — Y999 Unspecified external cause status: Secondary | ICD-10-CM | POA: Diagnosis not present

## 2020-07-12 ENCOUNTER — Telehealth: Payer: Self-pay

## 2020-07-12 DIAGNOSIS — D631 Anemia in chronic kidney disease: Secondary | ICD-10-CM | POA: Diagnosis not present

## 2020-07-12 DIAGNOSIS — R630 Anorexia: Secondary | ICD-10-CM | POA: Diagnosis not present

## 2020-07-12 DIAGNOSIS — T8612 Kidney transplant failure: Secondary | ICD-10-CM | POA: Diagnosis not present

## 2020-07-12 DIAGNOSIS — I517 Cardiomegaly: Secondary | ICD-10-CM | POA: Diagnosis not present

## 2020-07-12 DIAGNOSIS — Z6821 Body mass index (BMI) 21.0-21.9, adult: Secondary | ICD-10-CM | POA: Diagnosis not present

## 2020-07-12 DIAGNOSIS — I482 Chronic atrial fibrillation, unspecified: Secondary | ICD-10-CM | POA: Diagnosis not present

## 2020-07-12 DIAGNOSIS — R634 Abnormal weight loss: Secondary | ICD-10-CM | POA: Diagnosis not present

## 2020-07-12 DIAGNOSIS — I454 Nonspecific intraventricular block: Secondary | ICD-10-CM | POA: Diagnosis not present

## 2020-07-12 DIAGNOSIS — Z992 Dependence on renal dialysis: Secondary | ICD-10-CM | POA: Diagnosis not present

## 2020-07-12 DIAGNOSIS — Z5181 Encounter for therapeutic drug level monitoring: Secondary | ICD-10-CM | POA: Diagnosis not present

## 2020-07-12 DIAGNOSIS — E871 Hypo-osmolality and hyponatremia: Secondary | ICD-10-CM | POA: Diagnosis not present

## 2020-07-12 DIAGNOSIS — R112 Nausea with vomiting, unspecified: Secondary | ICD-10-CM | POA: Diagnosis not present

## 2020-07-12 DIAGNOSIS — R031 Nonspecific low blood-pressure reading: Secondary | ICD-10-CM | POA: Diagnosis not present

## 2020-07-12 DIAGNOSIS — D84821 Immunodeficiency due to drugs: Secondary | ICD-10-CM | POA: Diagnosis not present

## 2020-07-12 DIAGNOSIS — E1022 Type 1 diabetes mellitus with diabetic chronic kidney disease: Secondary | ICD-10-CM | POA: Diagnosis not present

## 2020-07-12 DIAGNOSIS — I083 Combined rheumatic disorders of mitral, aortic and tricuspid valves: Secondary | ICD-10-CM | POA: Diagnosis not present

## 2020-07-12 DIAGNOSIS — I132 Hypertensive heart and chronic kidney disease with heart failure and with stage 5 chronic kidney disease, or end stage renal disease: Secondary | ICD-10-CM | POA: Diagnosis not present

## 2020-07-12 DIAGNOSIS — I5032 Chronic diastolic (congestive) heart failure: Secondary | ICD-10-CM | POA: Diagnosis not present

## 2020-07-12 DIAGNOSIS — K521 Toxic gastroenteritis and colitis: Secondary | ICD-10-CM | POA: Diagnosis not present

## 2020-07-12 DIAGNOSIS — E872 Acidosis: Secondary | ICD-10-CM | POA: Diagnosis not present

## 2020-07-12 DIAGNOSIS — I120 Hypertensive chronic kidney disease with stage 5 chronic kidney disease or end stage renal disease: Secondary | ICD-10-CM | POA: Diagnosis not present

## 2020-07-12 DIAGNOSIS — R197 Diarrhea, unspecified: Secondary | ICD-10-CM | POA: Diagnosis not present

## 2020-07-12 DIAGNOSIS — N2581 Secondary hyperparathyroidism of renal origin: Secondary | ICD-10-CM | POA: Diagnosis not present

## 2020-07-12 DIAGNOSIS — N186 End stage renal disease: Secondary | ICD-10-CM | POA: Diagnosis not present

## 2020-07-12 DIAGNOSIS — I4891 Unspecified atrial fibrillation: Secondary | ICD-10-CM | POA: Diagnosis not present

## 2020-07-12 DIAGNOSIS — E1122 Type 2 diabetes mellitus with diabetic chronic kidney disease: Secondary | ICD-10-CM | POA: Diagnosis not present

## 2020-07-12 DIAGNOSIS — R001 Bradycardia, unspecified: Secondary | ICD-10-CM | POA: Diagnosis not present

## 2020-07-12 DIAGNOSIS — I444 Left anterior fascicular block: Secondary | ICD-10-CM | POA: Diagnosis not present

## 2020-07-12 DIAGNOSIS — Z6820 Body mass index (BMI) 20.0-20.9, adult: Secondary | ICD-10-CM | POA: Diagnosis not present

## 2020-07-12 NOTE — Telephone Encounter (Signed)
Spoke with patient's daughter, she reports she took her mom to the ER yesterday because she was vomiting and had not been able to eat anything in a week.  They gave her fluids and told her all labs were normal and she needed to follow up with PCP.  Daughter reports mom is still vomiting and not feeling well.  Appointment schduled tomorrow at 9:40 am with Dr. Livia Snellen.

## 2020-07-13 ENCOUNTER — Ambulatory Visit (INDEPENDENT_AMBULATORY_CARE_PROVIDER_SITE_OTHER): Payer: Medicare HMO | Admitting: Family Medicine

## 2020-07-13 DIAGNOSIS — Z992 Dependence on renal dialysis: Secondary | ICD-10-CM | POA: Diagnosis not present

## 2020-07-13 DIAGNOSIS — R112 Nausea with vomiting, unspecified: Secondary | ICD-10-CM | POA: Diagnosis not present

## 2020-07-13 DIAGNOSIS — Z6820 Body mass index (BMI) 20.0-20.9, adult: Secondary | ICD-10-CM | POA: Diagnosis not present

## 2020-07-13 DIAGNOSIS — D631 Anemia in chronic kidney disease: Secondary | ICD-10-CM | POA: Diagnosis not present

## 2020-07-13 DIAGNOSIS — D84821 Immunodeficiency due to drugs: Secondary | ICD-10-CM | POA: Diagnosis not present

## 2020-07-13 DIAGNOSIS — R634 Abnormal weight loss: Secondary | ICD-10-CM | POA: Diagnosis not present

## 2020-07-13 DIAGNOSIS — R197 Diarrhea, unspecified: Secondary | ICD-10-CM | POA: Diagnosis not present

## 2020-07-13 DIAGNOSIS — T8612 Kidney transplant failure: Secondary | ICD-10-CM | POA: Diagnosis not present

## 2020-07-13 DIAGNOSIS — E871 Hypo-osmolality and hyponatremia: Secondary | ICD-10-CM | POA: Diagnosis not present

## 2020-07-13 DIAGNOSIS — E872 Acidosis: Secondary | ICD-10-CM | POA: Diagnosis not present

## 2020-07-13 DIAGNOSIS — I120 Hypertensive chronic kidney disease with stage 5 chronic kidney disease or end stage renal disease: Secondary | ICD-10-CM | POA: Diagnosis not present

## 2020-07-13 DIAGNOSIS — R630 Anorexia: Secondary | ICD-10-CM | POA: Diagnosis not present

## 2020-07-13 DIAGNOSIS — N186 End stage renal disease: Secondary | ICD-10-CM | POA: Diagnosis not present

## 2020-07-14 ENCOUNTER — Encounter: Payer: Self-pay | Admitting: Family Medicine

## 2020-07-14 DIAGNOSIS — N186 End stage renal disease: Secondary | ICD-10-CM | POA: Diagnosis not present

## 2020-07-14 DIAGNOSIS — T8612 Kidney transplant failure: Secondary | ICD-10-CM | POA: Diagnosis not present

## 2020-07-14 DIAGNOSIS — E871 Hypo-osmolality and hyponatremia: Secondary | ICD-10-CM | POA: Diagnosis not present

## 2020-07-14 DIAGNOSIS — R112 Nausea with vomiting, unspecified: Secondary | ICD-10-CM | POA: Diagnosis not present

## 2020-07-14 DIAGNOSIS — Z6821 Body mass index (BMI) 21.0-21.9, adult: Secondary | ICD-10-CM | POA: Diagnosis not present

## 2020-07-14 DIAGNOSIS — R197 Diarrhea, unspecified: Secondary | ICD-10-CM | POA: Diagnosis not present

## 2020-07-14 DIAGNOSIS — D84821 Immunodeficiency due to drugs: Secondary | ICD-10-CM | POA: Diagnosis not present

## 2020-07-14 DIAGNOSIS — R630 Anorexia: Secondary | ICD-10-CM | POA: Diagnosis not present

## 2020-07-14 DIAGNOSIS — E872 Acidosis: Secondary | ICD-10-CM | POA: Diagnosis not present

## 2020-07-14 DIAGNOSIS — Z992 Dependence on renal dialysis: Secondary | ICD-10-CM | POA: Diagnosis not present

## 2020-07-14 DIAGNOSIS — R634 Abnormal weight loss: Secondary | ICD-10-CM | POA: Diagnosis not present

## 2020-07-14 DIAGNOSIS — D631 Anemia in chronic kidney disease: Secondary | ICD-10-CM | POA: Diagnosis not present

## 2020-07-14 DIAGNOSIS — R031 Nonspecific low blood-pressure reading: Secondary | ICD-10-CM | POA: Diagnosis not present

## 2020-07-14 NOTE — Progress Notes (Signed)
Pt. Did not show up for her appointment

## 2020-07-15 ENCOUNTER — Ambulatory Visit: Payer: Medicare HMO | Admitting: Family Medicine

## 2020-07-15 DIAGNOSIS — I454 Nonspecific intraventricular block: Secondary | ICD-10-CM | POA: Diagnosis not present

## 2020-07-15 DIAGNOSIS — N186 End stage renal disease: Secondary | ICD-10-CM | POA: Diagnosis not present

## 2020-07-15 DIAGNOSIS — I517 Cardiomegaly: Secondary | ICD-10-CM | POA: Diagnosis not present

## 2020-07-15 DIAGNOSIS — I444 Left anterior fascicular block: Secondary | ICD-10-CM | POA: Diagnosis not present

## 2020-07-15 DIAGNOSIS — Z992 Dependence on renal dialysis: Secondary | ICD-10-CM | POA: Diagnosis not present

## 2020-07-17 DIAGNOSIS — D631 Anemia in chronic kidney disease: Secondary | ICD-10-CM | POA: Diagnosis not present

## 2020-07-17 DIAGNOSIS — N186 End stage renal disease: Secondary | ICD-10-CM | POA: Diagnosis not present

## 2020-07-17 DIAGNOSIS — I5032 Chronic diastolic (congestive) heart failure: Secondary | ICD-10-CM | POA: Diagnosis not present

## 2020-07-17 DIAGNOSIS — E1122 Type 2 diabetes mellitus with diabetic chronic kidney disease: Secondary | ICD-10-CM | POA: Diagnosis not present

## 2020-07-17 DIAGNOSIS — N1330 Unspecified hydronephrosis: Secondary | ICD-10-CM | POA: Diagnosis not present

## 2020-07-17 DIAGNOSIS — I110 Hypertensive heart disease with heart failure: Secondary | ICD-10-CM | POA: Diagnosis not present

## 2020-07-17 DIAGNOSIS — T8612 Kidney transplant failure: Secondary | ICD-10-CM | POA: Diagnosis not present

## 2020-07-17 DIAGNOSIS — T83022D Displacement of nephrostomy catheter, subsequent encounter: Secondary | ICD-10-CM | POA: Diagnosis not present

## 2020-07-17 DIAGNOSIS — N12 Tubulo-interstitial nephritis, not specified as acute or chronic: Secondary | ICD-10-CM | POA: Diagnosis not present

## 2020-07-19 DIAGNOSIS — N186 End stage renal disease: Secondary | ICD-10-CM | POA: Diagnosis not present

## 2020-07-19 DIAGNOSIS — Z992 Dependence on renal dialysis: Secondary | ICD-10-CM | POA: Diagnosis not present

## 2020-07-20 ENCOUNTER — Telehealth: Payer: Self-pay | Admitting: *Deleted

## 2020-07-20 ENCOUNTER — Encounter: Payer: Self-pay | Admitting: Family Medicine

## 2020-07-20 DIAGNOSIS — N1330 Unspecified hydronephrosis: Secondary | ICD-10-CM | POA: Diagnosis not present

## 2020-07-20 DIAGNOSIS — D631 Anemia in chronic kidney disease: Secondary | ICD-10-CM | POA: Diagnosis not present

## 2020-07-20 DIAGNOSIS — T83022D Displacement of nephrostomy catheter, subsequent encounter: Secondary | ICD-10-CM | POA: Diagnosis not present

## 2020-07-20 DIAGNOSIS — T8612 Kidney transplant failure: Secondary | ICD-10-CM | POA: Diagnosis not present

## 2020-07-20 DIAGNOSIS — N12 Tubulo-interstitial nephritis, not specified as acute or chronic: Secondary | ICD-10-CM | POA: Diagnosis not present

## 2020-07-20 DIAGNOSIS — N186 End stage renal disease: Secondary | ICD-10-CM | POA: Diagnosis not present

## 2020-07-20 DIAGNOSIS — E1122 Type 2 diabetes mellitus with diabetic chronic kidney disease: Secondary | ICD-10-CM | POA: Diagnosis not present

## 2020-07-20 DIAGNOSIS — I5032 Chronic diastolic (congestive) heart failure: Secondary | ICD-10-CM | POA: Diagnosis not present

## 2020-07-20 DIAGNOSIS — I110 Hypertensive heart disease with heart failure: Secondary | ICD-10-CM | POA: Diagnosis not present

## 2020-07-20 NOTE — Telephone Encounter (Signed)
FYI: VM from [facility] PT w/ [facility] Pt & daughter reported to [facility] that she fell getting out of car when returning home yesterday from dialysis. Her buttocks & left knee are sore

## 2020-07-21 DIAGNOSIS — Z992 Dependence on renal dialysis: Secondary | ICD-10-CM | POA: Diagnosis not present

## 2020-07-21 DIAGNOSIS — N186 End stage renal disease: Secondary | ICD-10-CM | POA: Diagnosis not present

## 2020-07-23 DIAGNOSIS — N186 End stage renal disease: Secondary | ICD-10-CM | POA: Diagnosis not present

## 2020-07-23 DIAGNOSIS — Z992 Dependence on renal dialysis: Secondary | ICD-10-CM | POA: Diagnosis not present

## 2020-07-26 ENCOUNTER — Encounter: Payer: Self-pay | Admitting: Family Medicine

## 2020-07-26 ENCOUNTER — Other Ambulatory Visit: Payer: Self-pay

## 2020-07-26 ENCOUNTER — Emergency Department (HOSPITAL_COMMUNITY): Payer: Medicare HMO

## 2020-07-26 ENCOUNTER — Emergency Department (HOSPITAL_COMMUNITY)
Admission: EM | Admit: 2020-07-26 | Discharge: 2020-07-26 | Disposition: A | Discharge status: Home / Self Care | Payer: Medicare HMO | Attending: Emergency Medicine | Admitting: Emergency Medicine

## 2020-07-26 DIAGNOSIS — D649 Anemia, unspecified: Secondary | ICD-10-CM | POA: Diagnosis not present

## 2020-07-26 DIAGNOSIS — Z940 Kidney transplant status: Secondary | ICD-10-CM | POA: Diagnosis not present

## 2020-07-26 DIAGNOSIS — E785 Hyperlipidemia, unspecified: Secondary | ICD-10-CM | POA: Diagnosis not present

## 2020-07-26 DIAGNOSIS — R42 Dizziness and giddiness: Secondary | ICD-10-CM | POA: Diagnosis not present

## 2020-07-26 DIAGNOSIS — Z992 Dependence on renal dialysis: Secondary | ICD-10-CM | POA: Insufficient documentation

## 2020-07-26 DIAGNOSIS — I5043 Acute on chronic combined systolic (congestive) and diastolic (congestive) heart failure: Secondary | ICD-10-CM | POA: Insufficient documentation

## 2020-07-26 DIAGNOSIS — Z7902 Long term (current) use of antithrombotics/antiplatelets: Secondary | ICD-10-CM | POA: Insufficient documentation

## 2020-07-26 DIAGNOSIS — Z79899 Other long term (current) drug therapy: Secondary | ICD-10-CM | POA: Diagnosis not present

## 2020-07-26 DIAGNOSIS — W19XXXA Unspecified fall, initial encounter: Secondary | ICD-10-CM

## 2020-07-26 DIAGNOSIS — I132 Hypertensive heart and chronic kidney disease with heart failure and with stage 5 chronic kidney disease, or end stage renal disease: Secondary | ICD-10-CM | POA: Insufficient documentation

## 2020-07-26 DIAGNOSIS — S6991XA Unspecified injury of right wrist, hand and finger(s), initial encounter: Secondary | ICD-10-CM | POA: Diagnosis present

## 2020-07-26 DIAGNOSIS — N186 End stage renal disease: Secondary | ICD-10-CM | POA: Insufficient documentation

## 2020-07-26 DIAGNOSIS — I10 Essential (primary) hypertension: Secondary | ICD-10-CM | POA: Diagnosis not present

## 2020-07-26 DIAGNOSIS — Z7984 Long term (current) use of oral hypoglycemic drugs: Secondary | ICD-10-CM | POA: Diagnosis not present

## 2020-07-26 DIAGNOSIS — Z7901 Long term (current) use of anticoagulants: Secondary | ICD-10-CM | POA: Diagnosis not present

## 2020-07-26 DIAGNOSIS — S8001XA Contusion of right knee, initial encounter: Secondary | ICD-10-CM | POA: Diagnosis not present

## 2020-07-26 DIAGNOSIS — R000 Tachycardia, unspecified: Secondary | ICD-10-CM | POA: Diagnosis not present

## 2020-07-26 DIAGNOSIS — M25561 Pain in right knee: Secondary | ICD-10-CM | POA: Diagnosis not present

## 2020-07-26 DIAGNOSIS — E1122 Type 2 diabetes mellitus with diabetic chronic kidney disease: Secondary | ICD-10-CM | POA: Insufficient documentation

## 2020-07-26 DIAGNOSIS — W050XXA Fall from non-moving wheelchair, initial encounter: Secondary | ICD-10-CM | POA: Insufficient documentation

## 2020-07-26 DIAGNOSIS — Z794 Long term (current) use of insulin: Secondary | ICD-10-CM | POA: Insufficient documentation

## 2020-07-26 DIAGNOSIS — Z7982 Long term (current) use of aspirin: Secondary | ICD-10-CM | POA: Diagnosis not present

## 2020-07-26 DIAGNOSIS — E1169 Type 2 diabetes mellitus with other specified complication: Secondary | ICD-10-CM | POA: Insufficient documentation

## 2020-07-26 DIAGNOSIS — S61411A Laceration without foreign body of right hand, initial encounter: Secondary | ICD-10-CM | POA: Insufficient documentation

## 2020-07-26 DIAGNOSIS — I2510 Atherosclerotic heart disease of native coronary artery without angina pectoris: Secondary | ICD-10-CM | POA: Diagnosis not present

## 2020-07-26 LAB — CBC WITH DIFFERENTIAL/PLATELET
Abs Immature Granulocytes: 0.07 10*3/uL (ref 0.00–0.07)
Basophils Absolute: 0.1 10*3/uL (ref 0.0–0.1)
Basophils Relative: 1 %
Eosinophils Absolute: 0.5 10*3/uL (ref 0.0–0.5)
Eosinophils Relative: 5 %
HCT: 29.9 % — ABNORMAL LOW (ref 36.0–46.0)
Hemoglobin: 9.1 g/dL — ABNORMAL LOW (ref 12.0–15.0)
Immature Granulocytes: 1 %
Lymphocytes Relative: 7 %
Lymphs Abs: 0.7 10*3/uL (ref 0.7–4.0)
MCH: 31.4 pg (ref 26.0–34.0)
MCHC: 30.4 g/dL (ref 30.0–36.0)
MCV: 103.1 fL — ABNORMAL HIGH (ref 80.0–100.0)
Monocytes Absolute: 1 10*3/uL (ref 0.1–1.0)
Monocytes Relative: 10 %
Neutro Abs: 7.7 10*3/uL (ref 1.7–7.7)
Neutrophils Relative %: 76 %
Platelets: 178 10*3/uL (ref 150–400)
RBC: 2.9 MIL/uL — ABNORMAL LOW (ref 3.87–5.11)
RDW: 16 % — ABNORMAL HIGH (ref 11.5–15.5)
WBC: 10.1 10*3/uL (ref 4.0–10.5)
nRBC: 0 % (ref 0.0–0.2)

## 2020-07-26 LAB — COMPREHENSIVE METABOLIC PANEL
ALT: 24 U/L (ref 0–44)
AST: 24 U/L (ref 15–41)
Albumin: 2 g/dL — ABNORMAL LOW (ref 3.5–5.0)
Alkaline Phosphatase: 80 U/L (ref 38–126)
Anion gap: 7 (ref 5–15)
BUN: 46 mg/dL — ABNORMAL HIGH (ref 8–23)
CO2: 27 mmol/L (ref 22–32)
Calcium: 7.9 mg/dL — ABNORMAL LOW (ref 8.9–10.3)
Chloride: 103 mmol/L (ref 98–111)
Creatinine, Ser: 9.37 mg/dL — ABNORMAL HIGH (ref 0.44–1.00)
GFR, Estimated: 4 mL/min — ABNORMAL LOW (ref >60)
Glucose, Bld: 97 mg/dL (ref 70–99)
Potassium: 4.5 mmol/L (ref 3.5–5.1)
Sodium: 137 mmol/L (ref 135–145)
Total Bilirubin: 0.5 mg/dL (ref 0.3–1.2)
Total Protein: 4.7 g/dL — ABNORMAL LOW (ref 6.5–8.1)

## 2020-07-26 MED ORDER — SODIUM CHLORIDE 0.9 % IV BOLUS: 250.0000 mL | Freq: Once | INTRAVENOUS | Status: DC

## 2020-07-26 MED ORDER — ACETAMINOPHEN 325 MG PO TABS
650.0000 mg | ORAL_TABLET | Freq: Once | ORAL | Status: AC
  Administered 2020-07-26: 650 mg via ORAL
  Filled 2020-07-26: qty 2

## 2020-07-26 NOTE — ED Notes (Signed)
Upon sitting pt up to get into wheelchair at time of discharge. Pt hesitated and reported she felt "dizzy headed". BP checked-74/34, HR 75. Repeat BP 69/40, HR 73. Pt assisted back into bed with side rails up. Sophia Caccavale, PA notified of pt's abnormal vitals and symptoms upon sitting up. Order given to hold on discharge and give pt food and PO liquids. Pt given Diet Sprite and peanut butter graham crackers. Pt's husband and made aware since he was already called to come pick pt up for discharge. Will continue to monitor.

## 2020-07-26 NOTE — Discharge Instructions (Addendum)
Take Tylenol as needed for pain. Follow-up with your primary care doctor for recheck of your symptoms. Your blood counts were slightly low, however not far from your baseline.  Follow-up with your primary care regarding this. Return to the emergency room with any new, worsening, concerning symptoms

## 2020-07-26 NOTE — ED Provider Notes (Addendum)
[facility] EMERGENCY DEPARTMENT Provider Note   CSN: [id number] Arrival date & time: 07/26/20  1249     History Chief Complaint  Patient presents with  . Fall    [name] is a 65 y.o. female presenting for evaluation of right knee pain after a fall.  Patient states today she was leaving dialysis when she went to stand up in a wheelchair and got lightheaded, fell onto her right knee.  She has acute onset right anterior knee pain.  She not hit her head or loss of consciousness.  She does have a cut on her right dorsal hand from the fall, denies injury or pain elsewhere.  She has a history of orthostatic hypotension after dialysis, today did not feel any different normal.  No fevers, chest pain, shortness of breath, nausea, abdominal pain.  No recent change in medications.  She is on Eliquis.  She has not taken anything for pain. She has not had anything to eat today.   HPI     Past Medical History:  Diagnosis Date  . Abnormal CXR (chest x-ray) 05/25/2019  . Abnormal LFTs (liver function tests) 03/11/2018  . Acute hypoxemic respiratory failure ([place]) 12/16/2018  . Acute on chronic combined systolic and diastolic CHF (congestive heart failure) -EF 40 to 45 % 08/10/2019  . Acute respiratory failure with hypoxia ([place]) 12/02/2018  . Anemia    of chronic disease  . Blood transfusion without reported diagnosis   . Cardiovascular disease    nonobstructive  . Carotid artery stenosis 2008  . CHF (congestive heart failure) ([place])   . Coronary artery disease   . 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 Diabetes mellitus   . Dyslipidemia   . Edema of lower extremity    with hypo-albuminemia and profound protenuria  . HCAP (healthcare-associated pneumonia)   . Heart murmur   . Hypertension   . Lung  collapse 12/15/2018  . Mitral regurgitation   . Nephrotic syndrome   . Patent foramen ovale   . Pulmonary edema 08/10/2019  . Pulmonary hypertension, moderate to severe ([place])   . Pulmonary nodule   . Renal insufficiency   . Tricuspid regurgitation   . Volume depletion, renal, due to output loss (renal deficit)     Patient Active Problem List   Diagnosis Date Noted  . Moderate episode of recurrent major depressive disorder ([place]) 06/29/2020  . Hydronephrosis of failed kidney transplant 06/29/2020  . No-show for appointment 06/14/2020  . Vaginal yeast infection 06/14/2020  . Systolic and diastolic CHF, chronic ([place]) 01/07/2020  . Hyperkalemia   . Hydronephrosis   . Type 2 diabetes mellitus ([place]) 03/11/2018  . Valvular heart disease 03/11/2018  . Chronic kidney disease on chronic dialysis ([place]) 11/13/2017  . Leukocytosis 09/21/2017  . Generalized weakness 09/21/2017  . Hyperlipidemia associated with type 2 diabetes mellitus ([place]) 02/22/2017  . ESRD (end stage renal disease) ([place]) 08/04/2016  . Aortic valve sclerosis 06/08/2015  . Anemia of chronic disease 07/03/2013  . Essential hypertension 12/18/2010    Past Surgical History:  Procedure Laterality Date  . A/V FISTULAGRAM N/A 04/05/2017   Procedure: A/V FISTULAGRAM - Left Arm;  Surgeon: Angelia Mould, MD;  Location: [facility];  Service: Cardiovascular;  Laterality: N/A;  . A/V FISTULAGRAM Left 09/18/2017  Procedure: A/V FISTULAGRAM;  Surgeon: Serafina Mitchell, MD;  Location: [facility];  Service: Cardiovascular;  Laterality: Left;  . A/V SHUNT INTERVENTION Left 04/05/2017   Procedure: A/V SHUNT INTERVENTION;  Surgeon: Angelia Mould, MD;  Location: [facility];  Service: Cardiovascular;  Laterality: Left;  . CARDIAC CATHETERIZATION  2008  . IR REMOVAL TUN CV CATH W/O FL  11/02/2016  . 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 PERIPHERAL VASCULAR BALLOON ANGIOPLASTY Left 09/18/2017   Procedure:  PERIPHERAL VASCULAR BALLOON ANGIOPLASTY;  Surgeon: Serafina Mitchell, MD;  Location: [facility];  Service: Cardiovascular;  Laterality: Left;  Arm Fistula     OB History   No obstetric history on file.     Family History  Problem Relation Age of Onset  . Kidney disease Mother   . Diabetes Mother   . Diabetes Father   . Heart disease Father     Social History   Tobacco Use  . Smoking status: Never Smoker  . Smokeless tobacco: Never Used  Vaping Use  . Vaping Use: Never used  Substance Use Topics  . Alcohol use: No  . Drug use: No    Home Medications Prior to Admission medications   Medication Sig Start Date End Date Taking? Authorizing Provider  acetaminophen (TYLENOL) 500 MG tablet Take 500 mg by mouth every 6 (six) hours as needed for moderate pain or headache.  04/06/16  Yes [provider]  amiodarone (PACERONE) 100 MG tablet Take 100 mg by mouth daily. 07/09/20  Yes [provider]  apixaban (ELIQUIS) 2.5 MG TABS tablet Take 1 tablet (2.5 mg total) by mouth 2 (two) times daily. 07/08/20  Yes Claretta Fraise, MD  ARIPiprazole (ABILIFY) 5 MG tablet Take 1 tablet (5 mg total) by mouth at bedtime. 04/27/20  Yes Claretta Fraise, MD  aspirin (ASPIR-LOW) 81 MG EC tablet Take 1 tablet (81 mg total) by mouth daily with breakfast. 05/27/19  Yes Emokpae, Courage, MD  B Complex-C-Folic Acid (RENA-VITE RX) 1 MG TABS Take 1 tablet by mouth daily. 09/24/18  Yes [provider]  BRILINTA 90 MG TABS tablet Take 90 mg by mouth 2 (two) times daily. 05/13/20  Yes [provider]  cloNIDine (CATAPRES) 0.1 MG tablet Take 1 tablet (0.1 mg total) by mouth daily. 01/13/20  Yes Hawks, Christy A, FNP  clopidogrel (PLAVIX) 75 MG tablet Take 75 mg by mouth daily. 05/07/20  Yes [provider]  diclofenac Sodium (VOLTAREN) 1 % GEL Apply 2 g topically 4 (four) times daily. 02/13/20  Yes Ivy Lynn, NP  Dulaglutide (TRULICITY) 1.5 0000000 SOPN INJECT 1.'5MG'$    SUBCUTANEOUSLY ONCE A WEEK 04/27/20  Yes Claretta Fraise, MD  insulin degludec (TRESIBA FLEXTOUCH) 100 UNIT/ML FlexTouch Pen Inject 15 Units into the skin daily. 04/27/20  Yes Claretta Fraise, MD  isosorbide mononitrate (IMDUR) 60 MG 24 hr tablet Take 1 tablet (60 mg total) by mouth daily. 01/13/20  Yes Hawks, Christy A, FNP  Lidocaine 4 % PTCH Place onto the skin. 07/16/20  Yes [provider]  metoCLOPramide (REGLAN) 5 MG tablet Take 1 tablet (5 mg total) by mouth 4 (four) times daily -  before meals and at bedtime. Patient taking differently: Take 5 mg by mouth 3 (three) times daily before meals. 12/24/19  Yes Rolland Porter, MD  metoprolol succinate (TOPROL-XL) 25 MG 24 hr tablet Take 1 tablet (25 mg total) by mouth daily. 01/13/20  Yes Hawks, Theador Hawthorne, FNP  mirtazapine (REMERON)  15 MG tablet Take 1 tablet (15 mg total) by mouth at bedtime. For sleep 07/08/20  Yes Stacks, Cletus Gash, MD  nitroGLYCERIN (NITROSTAT) 0.4 MG SL tablet Place under the tongue. 05/18/20  Yes [provider]  ondansetron (ZOFRAN-ODT) 4 MG disintegrating tablet Take 4 mg by mouth every 8 (eight) hours as needed. 07/06/20  Yes [provider]  pantoprazole (PROTONIX) 40 MG tablet Take 1 tablet by mouth daily. 06/27/20  Yes [provider]  predniSONE (DELTASONE) 5 MG tablet Take 1 tablet (5 mg total) by mouth daily with breakfast. 07/22/19  Yes Barton Dubois, MD  PROGRAF 0.5 MG capsule Take 0.5 mg by mouth daily.  10/24/16  Yes [provider]  rosuvastatin (CRESTOR) 20 MG tablet Take 1 tablet (20 mg total) by mouth daily. 01/13/20  Yes Hawks, Christy A, FNP  senna (SENOKOT) 8.6 MG TABS tablet Take 1 tablet (8.6 mg total) by mouth 2 (two) times daily. 05/27/19  Yes Emokpae, Courage, MD  sertraline (ZOLOFT) 100 MG tablet Take 1 tablet (100 mg total) by mouth at bedtime. For depression 06/29/20  Yes Stacks, Cletus Gash, MD  sevelamer carbonate (RENVELA) 800 MG tablet Take 1,600-3,200 mg by mouth 5 (five) times  daily. Patient takes 4 tablets('3200mg'$ ) 3 times a day with meals and 2 tablets('1600mg'$ ) 2 times a day with snacks 10/02/16  Yes [provider]  amiodarone (PACERONE) 400 MG tablet Take 1 tablet (400 mg total) by mouth daily. Patient not taking: No sig reported 07/08/20   Claretta Fraise, MD  amLODipine (NORVASC) 5 MG tablet Hold until follow-up with PCP or cardiology Patient not taking: No sig reported 06/26/20   [provider]  isosorbide mononitrate (IMDUR) 30 MG 24 hr tablet Hold until follow-up with PCP or cardiology  (previous dose: 30 daily) Patient not taking: No sig reported 06/26/20   [provider]  lisinopril (ZESTRIL) 10 MG tablet Hold until follow-up with PCP  (usual dose 10 mg daily) Patient not taking: No sig reported 06/26/20   [provider]    Allergies    Other and Tape  Review of Systems   Review of Systems  Musculoskeletal: Positive for arthralgias.  Skin: Positive for wound.  Neurological: Positive for dizziness.  All other systems reviewed and are negative.   Physical Exam Updated Vital Signs BP (!) 120/41   Pulse 67   Temp 98 F (36.7 C) (Oral)   Resp 12   SpO2 100%   Physical Exam Vitals and nursing note reviewed.  Constitutional:      General: She is not in acute distress.    Appearance: She is well-developed.  HENT:     Head: Normocephalic and atraumatic.  Eyes:     Extraocular Movements: Extraocular movements intact.     Conjunctiva/sclera: Conjunctivae normal.     Pupils: Pupils are equal, round, and reactive to light.  Cardiovascular:     Rate and Rhythm: Normal rate and regular rhythm.     Pulses: Normal pulses.  Pulmonary:     Effort: Pulmonary effort is normal. No respiratory distress.     Breath sounds: Normal breath sounds. No wheezing.  Abdominal:     General: There is no distension.     Palpations: Abdomen is soft. There is no mass.     Tenderness: There is no abdominal tenderness. There is no  guarding or rebound.  Musculoskeletal:        General: Tenderness present.     Cervical back: Normal range of motion and  neck supple.     Comments: Mild swelling noted of the right knee when compared to the left.  Tenderness palpation of the anterior medial knee with a small contusion.  Increased pain with flexion, able to extend without pain.  Pedal pulses 2+ bilaterally.  Able to perform straight leg raise without difficulty. 1 cm skin tear of the right dorsal hand without active bleeding.  Full active range of motion of all fingers of the right hand and wrist without pain or difficulty.  Skin:    General: Skin is warm and dry.     Capillary Refill: Capillary refill takes less than 2 seconds.  Neurological:     Mental Status: She is alert and oriented to person, place, and time.     ED Results / Procedures / Treatments   Labs (all labs ordered are listed, but only abnormal results are displayed) Labs Reviewed  CBC WITH DIFFERENTIAL/PLATELET - Abnormal; Notable for the following components:      Result Value   RBC 2.90 (*)    Hemoglobin 9.1 (*)    HCT 29.9 (*)    MCV 103.1 (*)    RDW 16.0 (*)    All other components within normal limits  COMPREHENSIVE METABOLIC PANEL - Abnormal; Notable for the following components:   BUN 46 (*)    Creatinine, Ser 9.37 (*)    Calcium 7.9 (*)    Total Protein 4.7 (*)    Albumin 2.0 (*)    GFR, Estimated 4 (*)    All other components within normal limits    EKG None  Radiology DG Knee Complete 4 Views Right  Result Date: 07/26/2020 CLINICAL DATA:  Fall, general RIGHT knee pain. EXAM: RIGHT KNEE - COMPLETE 4+ VIEW COMPARISON:  None FINDINGS: Osteopenia. Mild degenerative changes. Signs of vascular calcification, extensive vascular calcification the soft tissues. No sign of acute fracture or dislocation.  No joint effusion. IMPRESSION: 1. No acute fracture or dislocation. 2. Osteopenia and mild degenerative changes. 3. Signs of vascular  calcification. Electronically Signed   By: Zetta Bills M.D.   On: 07/26/2020 14:29    Procedures Procedures   Medications Ordered in ED Medications  acetaminophen (TYLENOL) tablet 650 mg (650 mg Oral Given 07/26/20 1457)    ED Course  I have reviewed the triage vital signs and the nursing notes.  Pertinent labs & imaging results that were available during my care of the patient were reviewed by me and considered in my medical decision making (see chart for details).    MDM Rules/Calculators/A&P                          Patient presenting for evaluation after a fall.  On exam, patient is nontoxic.  She is neurovascular intact.  She injured her right knee and right hand, will obtain x-rays of the knee.  Right hand shows a skin tear, but otherwise has no bony tenderness or pain with movement, I do not believe she needs x-rays.  Will obtain basic blood work to ensure no anemia or electrolyte abnormality that could have contributed to the dizziness, however most likely postdialysis orthostasis which patient states is normal for her. She did not hit her head, and has no head pain or neuro deficits. Doubt intracranial bleed, will not order head ct at this time.   Labs interpreted by me, overall reassuring.  Mild anemia, however not far from patient's baseline.  Discussed with patient importance of  follow-up with PCP for recheck of blood counts.  Knee x-ray viewed and independently interpreted by me, no fracture or dislocation.  Discussed findings with patient.  She reports improvement of pain with Tylenol.  I discussed continued symptomatic treatment Tylenol and ice, follow-up with PCP as needed.  At this time, patient appears safe for discharge.  Return precautions given.  Patient states she understands and agrees to plan.  Informed by RN that on discharge, patient continues to have orthostatic dizziness.  Offered PO, as the patient states this is normally how she treats for post dialysis  hypotension/dizziness.  After p.o. food/fluids and continued observation, patient states she is now feeling well and ready to go home.  Husband is present, states this is normal for her after dialysis. Plan for d/c.    Final Clinical Impression(s) / ED Diagnoses Final diagnoses:  Acute pain of right knee  Fall, initial encounter  Anemia, unspecified type    Rx / DC Orders ED Discharge Orders    None       Franchot Heidelberg, PA-C 07/26/20 Bertie, Siedah Sedor, PA-C 07/26/20 2003    Fredia Sorrow, MD 07/31/20 0100

## 2020-07-26 NOTE — ED Triage Notes (Signed)
Pt brought to ED via [facility] from dialysis for fall, right knee pain. Pt is positive orthostatic for EMS. BP 102/69 HR 70 O2 sat 100% RA, CBG 150. Had full dialysis treatment.

## 2020-07-28 DIAGNOSIS — I5032 Chronic diastolic (congestive) heart failure: Secondary | ICD-10-CM | POA: Diagnosis not present

## 2020-07-28 DIAGNOSIS — Z955 Presence of coronary angioplasty implant and graft: Secondary | ICD-10-CM | POA: Diagnosis not present

## 2020-07-28 DIAGNOSIS — I132 Hypertensive heart and chronic kidney disease with heart failure and with stage 5 chronic kidney disease, or end stage renal disease: Secondary | ICD-10-CM | POA: Diagnosis not present

## 2020-07-28 DIAGNOSIS — Z992 Dependence on renal dialysis: Secondary | ICD-10-CM | POA: Diagnosis not present

## 2020-07-28 DIAGNOSIS — I350 Nonrheumatic aortic (valve) stenosis: Secondary | ICD-10-CM | POA: Diagnosis not present

## 2020-07-28 DIAGNOSIS — I272 Pulmonary hypertension, unspecified: Secondary | ICD-10-CM | POA: Diagnosis not present

## 2020-07-28 DIAGNOSIS — I480 Paroxysmal atrial fibrillation: Secondary | ICD-10-CM | POA: Diagnosis not present

## 2020-07-28 DIAGNOSIS — I498 Other specified cardiac arrhythmias: Secondary | ICD-10-CM | POA: Diagnosis not present

## 2020-07-28 DIAGNOSIS — N186 End stage renal disease: Secondary | ICD-10-CM | POA: Diagnosis not present

## 2020-07-28 DIAGNOSIS — I495 Sick sinus syndrome: Secondary | ICD-10-CM | POA: Diagnosis not present

## 2020-07-29 DIAGNOSIS — I110 Hypertensive heart disease with heart failure: Secondary | ICD-10-CM | POA: Diagnosis not present

## 2020-07-29 DIAGNOSIS — D631 Anemia in chronic kidney disease: Secondary | ICD-10-CM | POA: Diagnosis not present

## 2020-07-29 DIAGNOSIS — T8612 Kidney transplant failure: Secondary | ICD-10-CM | POA: Diagnosis not present

## 2020-07-29 DIAGNOSIS — E1122 Type 2 diabetes mellitus with diabetic chronic kidney disease: Secondary | ICD-10-CM | POA: Diagnosis not present

## 2020-07-29 DIAGNOSIS — T83022D Displacement of nephrostomy catheter, subsequent encounter: Secondary | ICD-10-CM | POA: Diagnosis not present

## 2020-07-29 DIAGNOSIS — I5032 Chronic diastolic (congestive) heart failure: Secondary | ICD-10-CM | POA: Diagnosis not present

## 2020-07-29 DIAGNOSIS — N12 Tubulo-interstitial nephritis, not specified as acute or chronic: Secondary | ICD-10-CM | POA: Diagnosis not present

## 2020-07-29 DIAGNOSIS — N186 End stage renal disease: Secondary | ICD-10-CM | POA: Diagnosis not present

## 2020-07-29 DIAGNOSIS — N1330 Unspecified hydronephrosis: Secondary | ICD-10-CM | POA: Diagnosis not present

## 2020-07-30 DIAGNOSIS — N186 End stage renal disease: Secondary | ICD-10-CM | POA: Diagnosis not present

## 2020-07-30 DIAGNOSIS — Z992 Dependence on renal dialysis: Secondary | ICD-10-CM | POA: Diagnosis not present

## 2020-08-02 DIAGNOSIS — E1122 Type 2 diabetes mellitus with diabetic chronic kidney disease: Secondary | ICD-10-CM | POA: Diagnosis not present

## 2020-08-02 DIAGNOSIS — T8612 Kidney transplant failure: Secondary | ICD-10-CM | POA: Diagnosis not present

## 2020-08-02 DIAGNOSIS — I5032 Chronic diastolic (congestive) heart failure: Secondary | ICD-10-CM | POA: Diagnosis not present

## 2020-08-02 DIAGNOSIS — I110 Hypertensive heart disease with heart failure: Secondary | ICD-10-CM | POA: Diagnosis not present

## 2020-08-02 DIAGNOSIS — N186 End stage renal disease: Secondary | ICD-10-CM | POA: Diagnosis not present

## 2020-08-02 DIAGNOSIS — D631 Anemia in chronic kidney disease: Secondary | ICD-10-CM | POA: Diagnosis not present

## 2020-08-02 DIAGNOSIS — N1330 Unspecified hydronephrosis: Secondary | ICD-10-CM | POA: Diagnosis not present

## 2020-08-02 DIAGNOSIS — N12 Tubulo-interstitial nephritis, not specified as acute or chronic: Secondary | ICD-10-CM | POA: Diagnosis not present

## 2020-08-02 DIAGNOSIS — T83022D Displacement of nephrostomy catheter, subsequent encounter: Secondary | ICD-10-CM | POA: Diagnosis not present

## 2020-08-02 DIAGNOSIS — Z992 Dependence on renal dialysis: Secondary | ICD-10-CM | POA: Diagnosis not present

## 2020-08-03 DIAGNOSIS — E1122 Type 2 diabetes mellitus with diabetic chronic kidney disease: Secondary | ICD-10-CM | POA: Diagnosis not present

## 2020-08-03 DIAGNOSIS — N1330 Unspecified hydronephrosis: Secondary | ICD-10-CM | POA: Diagnosis not present

## 2020-08-03 DIAGNOSIS — Z992 Dependence on renal dialysis: Secondary | ICD-10-CM | POA: Diagnosis not present

## 2020-08-03 DIAGNOSIS — D631 Anemia in chronic kidney disease: Secondary | ICD-10-CM | POA: Diagnosis not present

## 2020-08-03 DIAGNOSIS — I5032 Chronic diastolic (congestive) heart failure: Secondary | ICD-10-CM | POA: Diagnosis not present

## 2020-08-03 DIAGNOSIS — T83022D Displacement of nephrostomy catheter, subsequent encounter: Secondary | ICD-10-CM | POA: Diagnosis not present

## 2020-08-03 DIAGNOSIS — T8612 Kidney transplant failure: Secondary | ICD-10-CM | POA: Diagnosis not present

## 2020-08-03 DIAGNOSIS — I110 Hypertensive heart disease with heart failure: Secondary | ICD-10-CM | POA: Diagnosis not present

## 2020-08-03 DIAGNOSIS — N12 Tubulo-interstitial nephritis, not specified as acute or chronic: Secondary | ICD-10-CM | POA: Diagnosis not present

## 2020-08-03 DIAGNOSIS — N186 End stage renal disease: Secondary | ICD-10-CM | POA: Diagnosis not present

## 2020-08-04 DIAGNOSIS — N186 End stage renal disease: Secondary | ICD-10-CM | POA: Diagnosis not present

## 2020-08-04 DIAGNOSIS — Z992 Dependence on renal dialysis: Secondary | ICD-10-CM | POA: Diagnosis not present

## 2020-08-05 DIAGNOSIS — N186 End stage renal disease: Secondary | ICD-10-CM | POA: Diagnosis not present

## 2020-08-05 DIAGNOSIS — I871 Compression of vein: Secondary | ICD-10-CM | POA: Diagnosis not present

## 2020-08-05 DIAGNOSIS — Z992 Dependence on renal dialysis: Secondary | ICD-10-CM | POA: Diagnosis not present

## 2020-08-05 DIAGNOSIS — T82858A Stenosis of vascular prosthetic devices, implants and grafts, initial encounter: Secondary | ICD-10-CM | POA: Diagnosis not present

## 2020-08-06 DIAGNOSIS — Z992 Dependence on renal dialysis: Secondary | ICD-10-CM | POA: Diagnosis not present

## 2020-08-06 DIAGNOSIS — N186 End stage renal disease: Secondary | ICD-10-CM | POA: Diagnosis not present

## 2020-08-09 DIAGNOSIS — Z992 Dependence on renal dialysis: Secondary | ICD-10-CM | POA: Diagnosis not present

## 2020-08-09 DIAGNOSIS — N186 End stage renal disease: Secondary | ICD-10-CM | POA: Diagnosis not present

## 2020-08-10 ENCOUNTER — Ambulatory Visit (INDEPENDENT_AMBULATORY_CARE_PROVIDER_SITE_OTHER): Payer: Medicare HMO

## 2020-08-10 ENCOUNTER — Other Ambulatory Visit: Payer: Self-pay

## 2020-08-10 DIAGNOSIS — T83022D Displacement of nephrostomy catheter, subsequent encounter: Secondary | ICD-10-CM

## 2020-08-10 DIAGNOSIS — N12 Tubulo-interstitial nephritis, not specified as acute or chronic: Secondary | ICD-10-CM

## 2020-08-10 DIAGNOSIS — K219 Gastro-esophageal reflux disease without esophagitis: Secondary | ICD-10-CM

## 2020-08-10 DIAGNOSIS — Z8701 Personal history of pneumonia (recurrent): Secondary | ICD-10-CM

## 2020-08-10 DIAGNOSIS — E872 Acidosis: Secondary | ICD-10-CM

## 2020-08-10 DIAGNOSIS — N186 End stage renal disease: Secondary | ICD-10-CM

## 2020-08-10 DIAGNOSIS — I350 Nonrheumatic aortic (valve) stenosis: Secondary | ICD-10-CM

## 2020-08-10 DIAGNOSIS — I272 Pulmonary hypertension, unspecified: Secondary | ICD-10-CM

## 2020-08-10 DIAGNOSIS — Z7902 Long term (current) use of antithrombotics/antiplatelets: Secondary | ICD-10-CM

## 2020-08-10 DIAGNOSIS — I110 Hypertensive heart disease with heart failure: Secondary | ICD-10-CM | POA: Diagnosis not present

## 2020-08-10 DIAGNOSIS — D631 Anemia in chronic kidney disease: Secondary | ICD-10-CM

## 2020-08-10 DIAGNOSIS — E1169 Type 2 diabetes mellitus with other specified complication: Secondary | ICD-10-CM

## 2020-08-10 DIAGNOSIS — I5032 Chronic diastolic (congestive) heart failure: Secondary | ICD-10-CM

## 2020-08-10 DIAGNOSIS — E1122 Type 2 diabetes mellitus with diabetic chronic kidney disease: Secondary | ICD-10-CM

## 2020-08-10 DIAGNOSIS — T8612 Kidney transplant failure: Secondary | ICD-10-CM

## 2020-08-10 DIAGNOSIS — I2510 Atherosclerotic heart disease of native coronary artery without angina pectoris: Secondary | ICD-10-CM

## 2020-08-10 DIAGNOSIS — I959 Hypotension, unspecified: Secondary | ICD-10-CM

## 2020-08-10 DIAGNOSIS — K6389 Other specified diseases of intestine: Secondary | ICD-10-CM

## 2020-08-10 DIAGNOSIS — N1330 Unspecified hydronephrosis: Secondary | ICD-10-CM

## 2020-08-10 DIAGNOSIS — I4891 Unspecified atrial fibrillation: Secondary | ICD-10-CM

## 2020-08-10 DIAGNOSIS — I358 Other nonrheumatic aortic valve disorders: Secondary | ICD-10-CM

## 2020-08-10 DIAGNOSIS — Z992 Dependence on renal dialysis: Secondary | ICD-10-CM

## 2020-08-10 DIAGNOSIS — I252 Old myocardial infarction: Secondary | ICD-10-CM

## 2020-08-10 DIAGNOSIS — E11649 Type 2 diabetes mellitus with hypoglycemia without coma: Secondary | ICD-10-CM

## 2020-08-10 DIAGNOSIS — M1990 Unspecified osteoarthritis, unspecified site: Secondary | ICD-10-CM

## 2020-08-10 DIAGNOSIS — Z86010 Personal history of colonic polyps: Secondary | ICD-10-CM

## 2020-08-10 DIAGNOSIS — E785 Hyperlipidemia, unspecified: Secondary | ICD-10-CM

## 2020-08-11 DIAGNOSIS — N186 End stage renal disease: Secondary | ICD-10-CM | POA: Diagnosis not present

## 2020-08-11 DIAGNOSIS — Z992 Dependence on renal dialysis: Secondary | ICD-10-CM | POA: Diagnosis not present

## 2020-08-12 DIAGNOSIS — I471 Supraventricular tachycardia: Secondary | ICD-10-CM | POA: Diagnosis not present

## 2020-08-13 DIAGNOSIS — N186 End stage renal disease: Secondary | ICD-10-CM | POA: Diagnosis not present

## 2020-08-13 DIAGNOSIS — Z992 Dependence on renal dialysis: Secondary | ICD-10-CM | POA: Diagnosis not present

## 2020-08-16 DIAGNOSIS — Z992 Dependence on renal dialysis: Secondary | ICD-10-CM | POA: Diagnosis not present

## 2020-08-16 DIAGNOSIS — N186 End stage renal disease: Secondary | ICD-10-CM | POA: Diagnosis not present

## 2020-08-17 DIAGNOSIS — I110 Hypertensive heart disease with heart failure: Secondary | ICD-10-CM | POA: Diagnosis not present

## 2020-08-17 DIAGNOSIS — E1122 Type 2 diabetes mellitus with diabetic chronic kidney disease: Secondary | ICD-10-CM | POA: Diagnosis not present

## 2020-08-17 DIAGNOSIS — N1330 Unspecified hydronephrosis: Secondary | ICD-10-CM | POA: Diagnosis not present

## 2020-08-17 DIAGNOSIS — N186 End stage renal disease: Secondary | ICD-10-CM | POA: Diagnosis not present

## 2020-08-17 DIAGNOSIS — D631 Anemia in chronic kidney disease: Secondary | ICD-10-CM | POA: Diagnosis not present

## 2020-08-17 DIAGNOSIS — T8612 Kidney transplant failure: Secondary | ICD-10-CM | POA: Diagnosis not present

## 2020-08-17 DIAGNOSIS — I5032 Chronic diastolic (congestive) heart failure: Secondary | ICD-10-CM | POA: Diagnosis not present

## 2020-08-17 DIAGNOSIS — N12 Tubulo-interstitial nephritis, not specified as acute or chronic: Secondary | ICD-10-CM | POA: Diagnosis not present

## 2020-08-17 DIAGNOSIS — T83022D Displacement of nephrostomy catheter, subsequent encounter: Secondary | ICD-10-CM | POA: Diagnosis not present

## 2020-08-18 DIAGNOSIS — N186 End stage renal disease: Secondary | ICD-10-CM | POA: Diagnosis not present

## 2020-08-18 DIAGNOSIS — Z992 Dependence on renal dialysis: Secondary | ICD-10-CM | POA: Diagnosis not present

## 2020-08-19 DIAGNOSIS — I5032 Chronic diastolic (congestive) heart failure: Secondary | ICD-10-CM | POA: Diagnosis not present

## 2020-08-19 DIAGNOSIS — I110 Hypertensive heart disease with heart failure: Secondary | ICD-10-CM | POA: Diagnosis not present

## 2020-08-19 DIAGNOSIS — T8612 Kidney transplant failure: Secondary | ICD-10-CM | POA: Diagnosis not present

## 2020-08-19 DIAGNOSIS — D631 Anemia in chronic kidney disease: Secondary | ICD-10-CM | POA: Diagnosis not present

## 2020-08-19 DIAGNOSIS — N186 End stage renal disease: Secondary | ICD-10-CM | POA: Diagnosis not present

## 2020-08-19 DIAGNOSIS — N1330 Unspecified hydronephrosis: Secondary | ICD-10-CM | POA: Diagnosis not present

## 2020-08-19 DIAGNOSIS — N12 Tubulo-interstitial nephritis, not specified as acute or chronic: Secondary | ICD-10-CM | POA: Diagnosis not present

## 2020-08-19 DIAGNOSIS — T83022D Displacement of nephrostomy catheter, subsequent encounter: Secondary | ICD-10-CM | POA: Diagnosis not present

## 2020-08-19 DIAGNOSIS — E1122 Type 2 diabetes mellitus with diabetic chronic kidney disease: Secondary | ICD-10-CM | POA: Diagnosis not present

## 2020-08-20 DIAGNOSIS — Z992 Dependence on renal dialysis: Secondary | ICD-10-CM | POA: Diagnosis not present

## 2020-08-20 DIAGNOSIS — N186 End stage renal disease: Secondary | ICD-10-CM | POA: Diagnosis not present

## 2020-08-23 DIAGNOSIS — N186 End stage renal disease: Secondary | ICD-10-CM | POA: Diagnosis not present

## 2020-08-23 DIAGNOSIS — Z992 Dependence on renal dialysis: Secondary | ICD-10-CM | POA: Diagnosis not present

## 2020-08-25 DIAGNOSIS — N186 End stage renal disease: Secondary | ICD-10-CM | POA: Diagnosis not present

## 2020-08-25 DIAGNOSIS — Z992 Dependence on renal dialysis: Secondary | ICD-10-CM | POA: Diagnosis not present

## 2020-08-26 DIAGNOSIS — I352 Nonrheumatic aortic (valve) stenosis with insufficiency: Secondary | ICD-10-CM | POA: Diagnosis not present

## 2020-08-26 DIAGNOSIS — Z940 Kidney transplant status: Secondary | ICD-10-CM | POA: Diagnosis not present

## 2020-08-26 DIAGNOSIS — Z436 Encounter for attention to other artificial openings of urinary tract: Secondary | ICD-10-CM | POA: Diagnosis not present

## 2020-08-27 DIAGNOSIS — R7881 Bacteremia: Secondary | ICD-10-CM | POA: Diagnosis not present

## 2020-08-27 DIAGNOSIS — E162 Hypoglycemia, unspecified: Secondary | ICD-10-CM | POA: Diagnosis not present

## 2020-08-27 DIAGNOSIS — D631 Anemia in chronic kidney disease: Secondary | ICD-10-CM | POA: Diagnosis not present

## 2020-08-27 DIAGNOSIS — N139 Obstructive and reflux uropathy, unspecified: Secondary | ICD-10-CM | POA: Diagnosis not present

## 2020-08-27 DIAGNOSIS — N2581 Secondary hyperparathyroidism of renal origin: Secondary | ICD-10-CM | POA: Diagnosis not present

## 2020-08-27 DIAGNOSIS — K551 Chronic vascular disorders of intestine: Secondary | ICD-10-CM | POA: Diagnosis not present

## 2020-08-27 DIAGNOSIS — J189 Pneumonia, unspecified organism: Secondary | ICD-10-CM | POA: Diagnosis not present

## 2020-08-27 DIAGNOSIS — R509 Fever, unspecified: Secondary | ICD-10-CM | POA: Diagnosis not present

## 2020-08-27 DIAGNOSIS — I499 Cardiac arrhythmia, unspecified: Secondary | ICD-10-CM | POA: Diagnosis not present

## 2020-08-27 DIAGNOSIS — I5032 Chronic diastolic (congestive) heart failure: Secondary | ICD-10-CM | POA: Diagnosis not present

## 2020-08-27 DIAGNOSIS — I2510 Atherosclerotic heart disease of native coronary artery without angina pectoris: Secondary | ICD-10-CM | POA: Diagnosis not present

## 2020-08-27 DIAGNOSIS — Z20822 Contact with and (suspected) exposure to covid-19: Secondary | ICD-10-CM | POA: Diagnosis not present

## 2020-08-27 DIAGNOSIS — Z992 Dependence on renal dialysis: Secondary | ICD-10-CM | POA: Diagnosis not present

## 2020-08-27 DIAGNOSIS — T83593A Infection and inflammatory reaction due to other urinary stents, initial encounter: Secondary | ICD-10-CM | POA: Diagnosis not present

## 2020-08-27 DIAGNOSIS — I313 Pericardial effusion (noninflammatory): Secondary | ICD-10-CM | POA: Diagnosis not present

## 2020-08-27 DIAGNOSIS — Z792 Long term (current) use of antibiotics: Secondary | ICD-10-CM | POA: Diagnosis not present

## 2020-08-27 DIAGNOSIS — R1084 Generalized abdominal pain: Secondary | ICD-10-CM | POA: Diagnosis not present

## 2020-08-27 DIAGNOSIS — T8612 Kidney transplant failure: Secondary | ICD-10-CM | POA: Diagnosis not present

## 2020-08-27 DIAGNOSIS — B9561 Methicillin susceptible Staphylococcus aureus infection as the cause of diseases classified elsewhere: Secondary | ICD-10-CM | POA: Diagnosis not present

## 2020-08-27 DIAGNOSIS — T8613 Kidney transplant infection: Secondary | ICD-10-CM | POA: Diagnosis not present

## 2020-08-27 DIAGNOSIS — I10 Essential (primary) hypertension: Secondary | ICD-10-CM | POA: Diagnosis not present

## 2020-08-27 DIAGNOSIS — N12 Tubulo-interstitial nephritis, not specified as acute or chronic: Secondary | ICD-10-CM | POA: Diagnosis not present

## 2020-08-27 DIAGNOSIS — R1110 Vomiting, unspecified: Secondary | ICD-10-CM | POA: Diagnosis not present

## 2020-08-27 DIAGNOSIS — J810 Acute pulmonary edema: Secondary | ICD-10-CM | POA: Diagnosis not present

## 2020-08-27 DIAGNOSIS — K529 Noninfective gastroenteritis and colitis, unspecified: Secondary | ICD-10-CM | POA: Diagnosis not present

## 2020-08-27 DIAGNOSIS — N3000 Acute cystitis without hematuria: Secondary | ICD-10-CM | POA: Diagnosis not present

## 2020-08-27 DIAGNOSIS — I517 Cardiomegaly: Secondary | ICD-10-CM | POA: Diagnosis not present

## 2020-08-27 DIAGNOSIS — B9689 Other specified bacterial agents as the cause of diseases classified elsewhere: Secondary | ICD-10-CM | POA: Diagnosis not present

## 2020-08-27 DIAGNOSIS — R279 Unspecified lack of coordination: Secondary | ICD-10-CM | POA: Diagnosis not present

## 2020-08-27 DIAGNOSIS — I120 Hypertensive chronic kidney disease with stage 5 chronic kidney disease or end stage renal disease: Secondary | ICD-10-CM | POA: Diagnosis not present

## 2020-08-27 DIAGNOSIS — Z743 Need for continuous supervision: Secondary | ICD-10-CM | POA: Diagnosis not present

## 2020-08-27 DIAGNOSIS — R309 Painful micturition, unspecified: Secondary | ICD-10-CM | POA: Diagnosis not present

## 2020-08-27 DIAGNOSIS — R1111 Vomiting without nausea: Secondary | ICD-10-CM | POA: Diagnosis not present

## 2020-08-27 DIAGNOSIS — N186 End stage renal disease: Secondary | ICD-10-CM | POA: Diagnosis not present

## 2020-08-27 DIAGNOSIS — N261 Atrophy of kidney (terminal): Secondary | ICD-10-CM | POA: Diagnosis not present

## 2020-08-27 DIAGNOSIS — E1122 Type 2 diabetes mellitus with diabetic chronic kidney disease: Secondary | ICD-10-CM | POA: Diagnosis not present

## 2020-08-27 DIAGNOSIS — R918 Other nonspecific abnormal finding of lung field: Secondary | ICD-10-CM | POA: Diagnosis not present

## 2020-08-27 DIAGNOSIS — R0902 Hypoxemia: Secondary | ICD-10-CM | POA: Diagnosis not present

## 2020-08-27 DIAGNOSIS — N390 Urinary tract infection, site not specified: Secondary | ICD-10-CM | POA: Diagnosis not present

## 2020-08-27 DIAGNOSIS — R197 Diarrhea, unspecified: Secondary | ICD-10-CM | POA: Diagnosis not present

## 2020-08-27 DIAGNOSIS — I959 Hypotension, unspecified: Secondary | ICD-10-CM | POA: Diagnosis not present

## 2020-08-27 DIAGNOSIS — D696 Thrombocytopenia, unspecified: Secondary | ICD-10-CM | POA: Diagnosis not present

## 2020-08-27 DIAGNOSIS — R109 Unspecified abdominal pain: Secondary | ICD-10-CM | POA: Diagnosis not present

## 2020-08-27 DIAGNOSIS — Z794 Long term (current) use of insulin: Secondary | ICD-10-CM | POA: Diagnosis not present

## 2020-08-27 DIAGNOSIS — I480 Paroxysmal atrial fibrillation: Secondary | ICD-10-CM | POA: Diagnosis not present

## 2020-08-27 DIAGNOSIS — I132 Hypertensive heart and chronic kidney disease with heart failure and with stage 5 chronic kidney disease, or end stage renal disease: Secondary | ICD-10-CM | POA: Diagnosis not present

## 2020-08-27 DIAGNOSIS — J811 Chronic pulmonary edema: Secondary | ICD-10-CM | POA: Diagnosis not present

## 2020-08-27 DIAGNOSIS — D849 Immunodeficiency, unspecified: Secondary | ICD-10-CM | POA: Diagnosis not present

## 2020-08-30 DIAGNOSIS — Z0289 Encounter for other administrative examinations: Secondary | ICD-10-CM

## 2020-09-01 DIAGNOSIS — Z940 Kidney transplant status: Secondary | ICD-10-CM | POA: Diagnosis not present

## 2020-09-01 DIAGNOSIS — I509 Heart failure, unspecified: Secondary | ICD-10-CM | POA: Diagnosis not present

## 2020-09-01 DIAGNOSIS — A0472 Enterocolitis due to Clostridium difficile, not specified as recurrent: Secondary | ICD-10-CM | POA: Diagnosis not present

## 2020-09-01 DIAGNOSIS — I5032 Chronic diastolic (congestive) heart failure: Secondary | ICD-10-CM | POA: Diagnosis not present

## 2020-09-01 DIAGNOSIS — T8612 Kidney transplant failure: Secondary | ICD-10-CM | POA: Diagnosis not present

## 2020-09-01 DIAGNOSIS — I2510 Atherosclerotic heart disease of native coronary artery without angina pectoris: Secondary | ICD-10-CM | POA: Diagnosis not present

## 2020-09-01 DIAGNOSIS — Z992 Dependence on renal dialysis: Secondary | ICD-10-CM | POA: Diagnosis not present

## 2020-09-01 DIAGNOSIS — E1122 Type 2 diabetes mellitus with diabetic chronic kidney disease: Secondary | ICD-10-CM | POA: Diagnosis not present

## 2020-09-01 DIAGNOSIS — B9561 Methicillin susceptible Staphylococcus aureus infection as the cause of diseases classified elsewhere: Secondary | ICD-10-CM | POA: Diagnosis not present

## 2020-09-01 DIAGNOSIS — A4901 Methicillin susceptible Staphylococcus aureus infection, unspecified site: Secondary | ICD-10-CM | POA: Diagnosis not present

## 2020-09-01 DIAGNOSIS — R8279 Other abnormal findings on microbiological examination of urine: Secondary | ICD-10-CM | POA: Diagnosis not present

## 2020-09-01 DIAGNOSIS — R944 Abnormal results of kidney function studies: Secondary | ICD-10-CM | POA: Diagnosis not present

## 2020-09-01 DIAGNOSIS — F32A Depression, unspecified: Secondary | ICD-10-CM | POA: Diagnosis not present

## 2020-09-01 DIAGNOSIS — R7881 Bacteremia: Secondary | ICD-10-CM | POA: Diagnosis not present

## 2020-09-01 DIAGNOSIS — T8613 Kidney transplant infection: Secondary | ICD-10-CM | POA: Diagnosis not present

## 2020-09-01 DIAGNOSIS — N186 End stage renal disease: Secondary | ICD-10-CM | POA: Diagnosis not present

## 2020-09-01 DIAGNOSIS — E785 Hyperlipidemia, unspecified: Secondary | ICD-10-CM | POA: Diagnosis not present

## 2020-09-01 DIAGNOSIS — I132 Hypertensive heart and chronic kidney disease with heart failure and with stage 5 chronic kidney disease, or end stage renal disease: Secondary | ICD-10-CM | POA: Diagnosis not present

## 2020-09-01 DIAGNOSIS — N12 Tubulo-interstitial nephritis, not specified as acute or chronic: Secondary | ICD-10-CM | POA: Diagnosis not present

## 2020-09-01 DIAGNOSIS — N136 Pyonephrosis: Secondary | ICD-10-CM | POA: Diagnosis not present

## 2020-09-02 DIAGNOSIS — Z992 Dependence on renal dialysis: Secondary | ICD-10-CM | POA: Diagnosis not present

## 2020-09-02 DIAGNOSIS — N186 End stage renal disease: Secondary | ICD-10-CM | POA: Diagnosis not present

## 2020-09-06 DIAGNOSIS — N12 Tubulo-interstitial nephritis, not specified as acute or chronic: Secondary | ICD-10-CM | POA: Diagnosis not present

## 2020-09-06 DIAGNOSIS — N186 End stage renal disease: Secondary | ICD-10-CM | POA: Diagnosis not present

## 2020-09-06 DIAGNOSIS — Z992 Dependence on renal dialysis: Secondary | ICD-10-CM | POA: Diagnosis not present

## 2020-09-07 DIAGNOSIS — I132 Hypertensive heart and chronic kidney disease with heart failure and with stage 5 chronic kidney disease, or end stage renal disease: Secondary | ICD-10-CM | POA: Diagnosis not present

## 2020-09-07 DIAGNOSIS — I350 Nonrheumatic aortic (valve) stenosis: Secondary | ICD-10-CM | POA: Diagnosis not present

## 2020-09-07 DIAGNOSIS — I5032 Chronic diastolic (congestive) heart failure: Secondary | ICD-10-CM | POA: Diagnosis not present

## 2020-09-07 DIAGNOSIS — I352 Nonrheumatic aortic (valve) stenosis with insufficiency: Secondary | ICD-10-CM | POA: Diagnosis not present

## 2020-09-07 DIAGNOSIS — I38 Endocarditis, valve unspecified: Secondary | ICD-10-CM | POA: Diagnosis not present

## 2020-09-07 DIAGNOSIS — I110 Hypertensive heart disease with heart failure: Secondary | ICD-10-CM | POA: Diagnosis not present

## 2020-09-07 DIAGNOSIS — N186 End stage renal disease: Secondary | ICD-10-CM | POA: Diagnosis not present

## 2020-09-07 DIAGNOSIS — Z940 Kidney transplant status: Secondary | ICD-10-CM | POA: Diagnosis not present

## 2020-09-07 DIAGNOSIS — I2510 Atherosclerotic heart disease of native coronary artery without angina pectoris: Secondary | ICD-10-CM | POA: Diagnosis not present

## 2020-09-08 DIAGNOSIS — Z794 Long term (current) use of insulin: Secondary | ICD-10-CM | POA: Diagnosis not present

## 2020-09-08 DIAGNOSIS — Z992 Dependence on renal dialysis: Secondary | ICD-10-CM | POA: Diagnosis not present

## 2020-09-08 DIAGNOSIS — N12 Tubulo-interstitial nephritis, not specified as acute or chronic: Secondary | ICD-10-CM | POA: Diagnosis not present

## 2020-09-08 DIAGNOSIS — E119 Type 2 diabetes mellitus without complications: Secondary | ICD-10-CM | POA: Diagnosis not present

## 2020-09-08 DIAGNOSIS — N186 End stage renal disease: Secondary | ICD-10-CM | POA: Diagnosis not present

## 2020-09-10 DIAGNOSIS — N12 Tubulo-interstitial nephritis, not specified as acute or chronic: Secondary | ICD-10-CM | POA: Diagnosis not present

## 2020-09-10 DIAGNOSIS — Z992 Dependence on renal dialysis: Secondary | ICD-10-CM | POA: Diagnosis not present

## 2020-09-10 DIAGNOSIS — N186 End stage renal disease: Secondary | ICD-10-CM | POA: Diagnosis not present

## 2020-09-13 DIAGNOSIS — N186 End stage renal disease: Secondary | ICD-10-CM | POA: Diagnosis not present

## 2020-09-13 DIAGNOSIS — Z992 Dependence on renal dialysis: Secondary | ICD-10-CM | POA: Diagnosis not present

## 2020-09-13 DIAGNOSIS — I259 Chronic ischemic heart disease, unspecified: Secondary | ICD-10-CM | POA: Diagnosis not present

## 2020-09-13 DIAGNOSIS — N12 Tubulo-interstitial nephritis, not specified as acute or chronic: Secondary | ICD-10-CM | POA: Diagnosis not present

## 2020-09-14 DIAGNOSIS — Z7901 Long term (current) use of anticoagulants: Secondary | ICD-10-CM | POA: Diagnosis not present

## 2020-09-14 DIAGNOSIS — I480 Paroxysmal atrial fibrillation: Secondary | ICD-10-CM | POA: Diagnosis not present

## 2020-09-14 DIAGNOSIS — I471 Supraventricular tachycardia: Secondary | ICD-10-CM | POA: Diagnosis not present

## 2020-09-15 DIAGNOSIS — N12 Tubulo-interstitial nephritis, not specified as acute or chronic: Secondary | ICD-10-CM | POA: Diagnosis not present

## 2020-09-15 DIAGNOSIS — Z992 Dependence on renal dialysis: Secondary | ICD-10-CM | POA: Diagnosis not present

## 2020-09-15 DIAGNOSIS — N186 End stage renal disease: Secondary | ICD-10-CM | POA: Diagnosis not present

## 2020-09-16 DIAGNOSIS — I447 Left bundle-branch block, unspecified: Secondary | ICD-10-CM | POA: Diagnosis not present

## 2020-09-16 DIAGNOSIS — I482 Chronic atrial fibrillation, unspecified: Secondary | ICD-10-CM | POA: Diagnosis not present

## 2020-09-17 DIAGNOSIS — Z992 Dependence on renal dialysis: Secondary | ICD-10-CM | POA: Diagnosis not present

## 2020-09-17 DIAGNOSIS — N12 Tubulo-interstitial nephritis, not specified as acute or chronic: Secondary | ICD-10-CM | POA: Diagnosis not present

## 2020-09-17 DIAGNOSIS — N186 End stage renal disease: Secondary | ICD-10-CM | POA: Diagnosis not present

## 2020-09-20 DIAGNOSIS — Z992 Dependence on renal dialysis: Secondary | ICD-10-CM | POA: Diagnosis not present

## 2020-09-20 DIAGNOSIS — N12 Tubulo-interstitial nephritis, not specified as acute or chronic: Secondary | ICD-10-CM | POA: Diagnosis not present

## 2020-09-20 DIAGNOSIS — N186 End stage renal disease: Secondary | ICD-10-CM | POA: Diagnosis not present

## 2020-09-21 DIAGNOSIS — T8612 Kidney transplant failure: Secondary | ICD-10-CM | POA: Diagnosis not present

## 2020-09-22 DIAGNOSIS — Z992 Dependence on renal dialysis: Secondary | ICD-10-CM | POA: Diagnosis not present

## 2020-09-22 DIAGNOSIS — N12 Tubulo-interstitial nephritis, not specified as acute or chronic: Secondary | ICD-10-CM | POA: Diagnosis not present

## 2020-09-22 DIAGNOSIS — N186 End stage renal disease: Secondary | ICD-10-CM | POA: Diagnosis not present

## 2020-09-24 DIAGNOSIS — N12 Tubulo-interstitial nephritis, not specified as acute or chronic: Secondary | ICD-10-CM | POA: Diagnosis not present

## 2020-09-24 DIAGNOSIS — N186 End stage renal disease: Secondary | ICD-10-CM | POA: Diagnosis not present

## 2020-09-24 DIAGNOSIS — Z992 Dependence on renal dialysis: Secondary | ICD-10-CM | POA: Diagnosis not present

## 2020-09-27 DIAGNOSIS — Z992 Dependence on renal dialysis: Secondary | ICD-10-CM | POA: Diagnosis not present

## 2020-09-27 DIAGNOSIS — N186 End stage renal disease: Secondary | ICD-10-CM | POA: Diagnosis not present

## 2020-09-27 DIAGNOSIS — N12 Tubulo-interstitial nephritis, not specified as acute or chronic: Secondary | ICD-10-CM | POA: Diagnosis not present

## 2020-09-29 DIAGNOSIS — N186 End stage renal disease: Secondary | ICD-10-CM | POA: Diagnosis not present

## 2020-09-29 DIAGNOSIS — Z992 Dependence on renal dialysis: Secondary | ICD-10-CM | POA: Diagnosis not present

## 2020-09-29 DIAGNOSIS — N12 Tubulo-interstitial nephritis, not specified as acute or chronic: Secondary | ICD-10-CM | POA: Diagnosis not present

## 2020-09-30 DIAGNOSIS — R944 Abnormal results of kidney function studies: Secondary | ICD-10-CM | POA: Diagnosis not present

## 2020-09-30 DIAGNOSIS — Z79899 Other long term (current) drug therapy: Secondary | ICD-10-CM | POA: Diagnosis not present

## 2020-09-30 DIAGNOSIS — I499 Cardiac arrhythmia, unspecified: Secondary | ICD-10-CM | POA: Diagnosis not present

## 2020-09-30 DIAGNOSIS — N2889 Other specified disorders of kidney and ureter: Secondary | ICD-10-CM | POA: Diagnosis not present

## 2020-09-30 DIAGNOSIS — Z940 Kidney transplant status: Secondary | ICD-10-CM | POA: Diagnosis not present

## 2020-09-30 DIAGNOSIS — I878 Other specified disorders of veins: Secondary | ICD-10-CM | POA: Diagnosis not present

## 2020-09-30 DIAGNOSIS — N12 Tubulo-interstitial nephritis, not specified as acute or chronic: Secondary | ICD-10-CM | POA: Diagnosis not present

## 2020-10-01 DIAGNOSIS — N186 End stage renal disease: Secondary | ICD-10-CM | POA: Diagnosis not present

## 2020-10-01 DIAGNOSIS — N12 Tubulo-interstitial nephritis, not specified as acute or chronic: Secondary | ICD-10-CM | POA: Diagnosis not present

## 2020-10-01 DIAGNOSIS — Z992 Dependence on renal dialysis: Secondary | ICD-10-CM | POA: Diagnosis not present

## 2020-10-03 DIAGNOSIS — N186 End stage renal disease: Secondary | ICD-10-CM | POA: Diagnosis not present

## 2020-10-03 DIAGNOSIS — Z992 Dependence on renal dialysis: Secondary | ICD-10-CM | POA: Diagnosis not present

## 2020-10-04 ENCOUNTER — Telehealth: Payer: Self-pay | Admitting: Family Medicine

## 2020-10-04 DIAGNOSIS — Z992 Dependence on renal dialysis: Secondary | ICD-10-CM | POA: Diagnosis not present

## 2020-10-04 DIAGNOSIS — N12 Tubulo-interstitial nephritis, not specified as acute or chronic: Secondary | ICD-10-CM | POA: Diagnosis not present

## 2020-10-04 DIAGNOSIS — E1165 Type 2 diabetes mellitus with hyperglycemia: Secondary | ICD-10-CM

## 2020-10-04 DIAGNOSIS — N186 End stage renal disease: Secondary | ICD-10-CM | POA: Diagnosis not present

## 2020-10-04 MED ORDER — TRULICITY 1.5 MG/0.5ML ~~LOC~~ SOAJ: SUBCUTANEOUS | 0 refills | Status: DC

## 2020-10-04 NOTE — Telephone Encounter (Signed)
  Prescription Request  10/04/2020  What is the name of the medication or equipment? Trulicity  Have you contacted your pharmacy to request a refill? (if applicable) yes  Which pharmacy would you like this sent to? [facility],[place]  Pts daughter said that pt is out of her Trulicity and needs refills sent to pharmacy. Call to confirm whether refills were sent or not.

## 2020-10-04 NOTE — Telephone Encounter (Signed)
One month supply sent to pharmacy, needs to be seen for further refills.

## 2020-10-06 DIAGNOSIS — N12 Tubulo-interstitial nephritis, not specified as acute or chronic: Secondary | ICD-10-CM | POA: Diagnosis not present

## 2020-10-06 DIAGNOSIS — N186 End stage renal disease: Secondary | ICD-10-CM | POA: Diagnosis not present

## 2020-10-06 DIAGNOSIS — Z992 Dependence on renal dialysis: Secondary | ICD-10-CM | POA: Diagnosis not present

## 2020-10-08 DIAGNOSIS — Z992 Dependence on renal dialysis: Secondary | ICD-10-CM | POA: Diagnosis not present

## 2020-10-08 DIAGNOSIS — N186 End stage renal disease: Secondary | ICD-10-CM | POA: Diagnosis not present

## 2020-10-08 DIAGNOSIS — N12 Tubulo-interstitial nephritis, not specified as acute or chronic: Secondary | ICD-10-CM | POA: Diagnosis not present

## 2020-10-11 DIAGNOSIS — N12 Tubulo-interstitial nephritis, not specified as acute or chronic: Secondary | ICD-10-CM | POA: Diagnosis not present

## 2020-10-11 DIAGNOSIS — Z992 Dependence on renal dialysis: Secondary | ICD-10-CM | POA: Diagnosis not present

## 2020-10-11 DIAGNOSIS — N186 End stage renal disease: Secondary | ICD-10-CM | POA: Diagnosis not present

## 2020-10-13 DIAGNOSIS — N12 Tubulo-interstitial nephritis, not specified as acute or chronic: Secondary | ICD-10-CM | POA: Diagnosis not present

## 2020-10-13 DIAGNOSIS — Z992 Dependence on renal dialysis: Secondary | ICD-10-CM | POA: Diagnosis not present

## 2020-10-13 DIAGNOSIS — N186 End stage renal disease: Secondary | ICD-10-CM | POA: Diagnosis not present

## 2020-10-15 DIAGNOSIS — N12 Tubulo-interstitial nephritis, not specified as acute or chronic: Secondary | ICD-10-CM | POA: Diagnosis not present

## 2020-10-15 DIAGNOSIS — N186 End stage renal disease: Secondary | ICD-10-CM | POA: Diagnosis not present

## 2020-10-15 DIAGNOSIS — Z992 Dependence on renal dialysis: Secondary | ICD-10-CM | POA: Diagnosis not present

## 2020-10-18 DIAGNOSIS — N186 End stage renal disease: Secondary | ICD-10-CM | POA: Diagnosis not present

## 2020-10-18 DIAGNOSIS — N12 Tubulo-interstitial nephritis, not specified as acute or chronic: Secondary | ICD-10-CM | POA: Diagnosis not present

## 2020-10-18 DIAGNOSIS — Z992 Dependence on renal dialysis: Secondary | ICD-10-CM | POA: Diagnosis not present

## 2020-10-19 ENCOUNTER — Encounter: Payer: Self-pay | Admitting: Family Medicine

## 2020-10-19 ENCOUNTER — Other Ambulatory Visit: Payer: Self-pay

## 2020-10-19 ENCOUNTER — Ambulatory Visit (INDEPENDENT_AMBULATORY_CARE_PROVIDER_SITE_OTHER): Payer: Medicare HMO | Admitting: Family Medicine

## 2020-10-19 DIAGNOSIS — F331 Major depressive disorder, recurrent, moderate: Secondary | ICD-10-CM

## 2020-10-19 DIAGNOSIS — E1165 Type 2 diabetes mellitus with hyperglycemia: Secondary | ICD-10-CM

## 2020-10-19 DIAGNOSIS — Z794 Long term (current) use of insulin: Secondary | ICD-10-CM | POA: Diagnosis not present

## 2020-10-19 DIAGNOSIS — E785 Hyperlipidemia, unspecified: Secondary | ICD-10-CM | POA: Diagnosis not present

## 2020-10-19 DIAGNOSIS — E1169 Type 2 diabetes mellitus with other specified complication: Secondary | ICD-10-CM

## 2020-10-19 LAB — BAYER DCA HB A1C WAIVED: HB A1C (BAYER DCA - WAIVED): 6.7 % (ref <7.0)

## 2020-10-19 MED ORDER — TRULICITY 1.5 MG/0.5ML ~~LOC~~ SOAJ: SUBCUTANEOUS | 3 refills | Status: AC

## 2020-10-19 MED ORDER — MIRTAZAPINE 15 MG PO TABS: 15.0000 mg | ORAL_TABLET | Freq: Every day | ORAL | 3 refills | Status: AC

## 2020-10-19 MED ORDER — ROSUVASTATIN CALCIUM 20 MG PO TABS: 20.0000 mg | ORAL_TABLET | Freq: Every day | ORAL | 3 refills | Status: AC

## 2020-10-19 MED ORDER — SERTRALINE HCL 100 MG PO TABS: 100.0000 mg | ORAL_TABLET | Freq: Every day | ORAL | 3 refills | Status: AC

## 2020-10-19 NOTE — Progress Notes (Signed)
Subjective:  Patient ID: [name],  female    DOB: [birth year]  Age: 65 y.o.    CC: Medication Refill   HPI [name] presents for  follow-up of hypertension. Patient has no history of headache chest pain or shortness of breath or recent cough. Patient also denies symptoms of TIA such as numbness weakness lateralizing. Patient denies side effects from medication. States taking it regularly.  Patient also  in for follow-up of elevated cholesterol. Doing well without complaints on current medication. Denies side effects  including myalgia and arthralgia and nausea. Also in today for liver function testing. Currently no chest pain, shortness of breath or other cardiovascular related symptoms noted.  Follow-up of diabetes. Patient does check blood sugar at home. Readings run good lately. Checkingat dialysis Patient denies symptoms such as excessive hunger or urinary frequency, excessive hunger, nausea No significant hypoglycemic spells noted. Medications reviewed. Pt reports taking them regularly. Pt. denies complication/adverse reaction today.   Depression is better.Still weak. Can't have surgery. Getting hemodialysis.  Renal Transplant nonfunctioning, but can't be removed. Energy is poor as a result.  Depression screen [facility] 2/9 10/19/2020 06/29/2020 04/27/2020 04/13/2020 03/30/2020  Decreased Interest '2 3 1 3 3  '$ Down, Depressed, Hopeless '1 2 2 1 '$ 0  PHQ - 2 Score '3 5 3 4 3  '$ Altered sleeping '2 3 1 3 2  '$ Tired, decreased energy '3 2 2 3 2  '$ Change in appetite '1 3 1 3 1  '$ Feeling bad or failure about yourself  '1 3 1 2 1  '$ Trouble concentrating '2 3 1 2 1  '$ Moving slowly or fidgety/restless 1 0 '1 3 2  '$ Suicidal thoughts 0 2 1 0 0  PHQ-9 Score '13 21 11 20 12  '$ Difficult doing work/chores Somewhat difficult Extremely dIfficult Somewhat difficult Somewhat difficult Somewhat difficult  Some recent data might be hidden      History [name] has a past medical history of Abnormal CXR (chest  x-ray) (05/25/2019), Abnormal LFTs (liver function tests) (03/11/2018), Acute hypoxemic respiratory failure ([place]) (12/16/2018), Acute on chronic combined systolic and diastolic CHF (congestive heart failure) -EF 40 to 45 % (08/10/2019), Acute respiratory failure with hypoxia ([place]) (12/02/2018), Anemia, Blood transfusion without reported diagnosis, Cardiovascular disease, Carotid artery stenosis (2008), CHF (congestive heart failure) ([place]), Coronary artery disease, Deceased-donor kidney transplant recipient (05/22/2016), Diabetes mellitus, Dyslipidemia, Edema of lower extremity, HCAP (healthcare-associated pneumonia), Heart murmur, Hypertension, Lung collapse (12/15/2018), Mitral regurgitation, Nephrotic syndrome, Patent foramen ovale, Pulmonary edema (08/10/2019), Pulmonary hypertension, moderate to severe ([place]), Pulmonary nodule, Renal insufficiency, Tricuspid regurgitation, and Volume depletion, renal, due to output loss (renal deficit).   She has a past surgical history that includes Cardiac catheterization (2008); IR Removal Tun Cv Cath W/O FL (11/02/2016); Kidney transplant (Right, 02/23/2009); A/V Fistulagram (N/A, 04/05/2017); A/V SHUNT INTERVENTION (Left, 04/05/2017); A/V Fistulagram (Left, 09/18/2017); and PERIPHERAL VASCULAR BALLOON ANGIOPLASTY (Left, 09/18/2017).   Her family history includes Diabetes in her father and mother; Heart disease in her father; Kidney disease in her mother.She reports that she has never smoked. She has never used smokeless tobacco. She reports that she does not drink alcohol and does not use drugs.  Current Outpatient Medications on File Prior to Visit  Medication Sig Dispense Refill   acetaminophen (TYLENOL) 500 MG tablet Take 500 mg by mouth every 6 (six) hours as needed for moderate pain or headache.      amiodarone (PACERONE) 100 MG tablet Take 100 mg by mouth daily.  amiodarone (PACERONE) 400 MG tablet Take 1 tablet (400 mg total) by mouth daily. (Patient not taking: No sig  reported) 30 tablet 5   amLODipine (NORVASC) 5 MG tablet Hold until follow-up with PCP or cardiology (Patient not taking: No sig reported)     apixaban (ELIQUIS) 2.5 MG TABS tablet Take 1 tablet (2.5 mg total) by mouth 2 (two) times daily. 60 tablet 5   ARIPiprazole (ABILIFY) 5 MG tablet Take 1 tablet (5 mg total) by mouth at bedtime. 30 tablet 1   aspirin (ASPIR-LOW) 81 MG EC tablet Take 1 tablet (81 mg total) by mouth daily with breakfast. 30 tablet 12   B Complex-C-Folic Acid (RENA-VITE RX) 1 MG TABS Take 1 tablet by mouth daily.     BRILINTA 90 MG TABS tablet Take 90 mg by mouth 2 (two) times daily.     cloNIDine (CATAPRES) 0.1 MG tablet Take 1 tablet (0.1 mg total) by mouth daily. 90 tablet 3   clopidogrel (PLAVIX) 75 MG tablet Take 75 mg by mouth daily.     diclofenac Sodium (VOLTAREN) 1 % GEL Apply 2 g topically 4 (four) times daily. 50 g 3   insulin degludec (TRESIBA FLEXTOUCH) 100 UNIT/ML FlexTouch Pen Inject 15 Units into the skin daily. 12 mL 2   isosorbide mononitrate (IMDUR) 30 MG 24 hr tablet Hold until follow-up with PCP or cardiology  (previous dose: 30 daily) (Patient not taking: No sig reported)     isosorbide mononitrate (IMDUR) 60 MG 24 hr tablet Take 1 tablet (60 mg total) by mouth daily. 90 tablet 3   Lidocaine 4 % PTCH Place onto the skin.     lisinopril (ZESTRIL) 10 MG tablet Hold until follow-up with PCP  (usual dose 10 mg daily) (Patient not taking: No sig reported)     metoCLOPramide (REGLAN) 5 MG tablet Take 1 tablet (5 mg total) by mouth 4 (four) times daily -  before meals and at bedtime. (Patient taking differently: Take 5 mg by mouth 3 (three) times daily before meals.) 60 tablet 0   metoprolol succinate (TOPROL-XL) 25 MG 24 hr tablet Take 1 tablet (25 mg total) by mouth daily. 90 tablet 3   nitroGLYCERIN (NITROSTAT) 0.4 MG SL tablet Place under the tongue.     ondansetron (ZOFRAN-ODT) 4 MG disintegrating tablet Take 4 mg by mouth every 8 (eight) hours as needed.      pantoprazole (PROTONIX) 40 MG tablet Take 1 tablet by mouth daily.     predniSONE (DELTASONE) 5 MG tablet Take 1 tablet (5 mg total) by mouth daily with breakfast.     PROGRAF 0.5 MG capsule Take 0.5 mg by mouth daily.   0   senna (SENOKOT) 8.6 MG TABS tablet Take 1 tablet (8.6 mg total) by mouth 2 (two) times daily. 120 tablet 0   sevelamer carbonate (RENVELA) 800 MG tablet Take 1,600-3,200 mg by mouth 5 (five) times daily. Patient takes 4 tablets('3200mg'$ ) 3 times a day with meals and 2 tablets('1600mg'$ ) 2 times a day with snacks     No current facility-administered medications on file prior to visit.    ROS Review of Systems  Objective:  BP (!) 199/65   Pulse (!) 56   Temp (!) 97.3 F (36.3 C)   SpO2 99%   BP Readings from Last 3 Encounters:  10/19/20 (!) 199/65  07/26/20 (!) 120/42  06/29/20 (!) 136/51    Wt Readings from Last 3 Encounters:  06/29/20 160 lb (72.6 kg)  04/27/20  160 lb (72.6 kg)  04/13/20 157 lb 3.2 oz (71.3 kg)     Physical Exam  Diabetic Foot Exam - Simple   No data filed       Assessment & Plan:   [name] was seen today for medication refill.  Diagnoses and all orders for this visit:  Moderate episode of recurrent major depressive disorder (HCC) -     sertraline (ZOLOFT) 100 MG tablet; Take 1 tablet (100 mg total) by mouth at bedtime. For depression  Hyperlipidemia associated with type 2 diabetes mellitus (HCC) -     rosuvastatin (CRESTOR) 20 MG tablet; Take 1 tablet (20 mg total) by mouth daily.  Type 2 diabetes mellitus with hyperglycemia, with long-term current use of insulin (HCC) -     Dulaglutide (TRULICITY) 1.5 0000000 SOPN; INJECT 1.'5MG'$   SUBCUTANEOUSLY ONCE A WEEK. -     Bayer DCA Hb A1c Waived  Other orders -     mirtazapine (REMERON) 15 MG tablet; Take 1 tablet (15 mg total) by mouth at bedtime. For sleep  I have changed Thurmond Butts. [place]'s Trulicity. I am also having her maintain her acetaminophen, sevelamer carbonate, Prograf,  Rena-Vite Rx, senna, aspirin, predniSONE, metoCLOPramide, cloNIDine, metoprolol succinate, isosorbide mononitrate, diclofenac Sodium, ARIPiprazole, [name], isosorbide mononitrate, pantoprazole, lisinopril, amLODipine, apixaban, amiodarone, amiodarone, clopidogrel, Brilinta, ondansetron, nitroGLYCERIN, Lidocaine, sertraline, rosuvastatin, and mirtazapine.  Meds ordered this encounter  Medications   sertraline (ZOLOFT) 100 MG tablet    Sig: Take 1 tablet (100 mg total) by mouth at bedtime. For depression    Dispense:  90 tablet    Refill:  3   rosuvastatin (CRESTOR) 20 MG tablet    Sig: Take 1 tablet (20 mg total) by mouth daily.    Dispense:  90 tablet    Refill:  3   mirtazapine (REMERON) 15 MG tablet    Sig: Take 1 tablet (15 mg total) by mouth at bedtime. For sleep    Dispense:  90 tablet    Refill:  3   Dulaglutide (TRULICITY) 1.5 0000000 SOPN    Sig: INJECT 1.'5MG'$   SUBCUTANEOUSLY ONCE A WEEK.    Dispense:  2 mL    Refill:  3     Follow-up: Return in about 3 months (around 01/19/2021).  Claretta Fraise, M.D.

## 2020-10-20 DIAGNOSIS — Z992 Dependence on renal dialysis: Secondary | ICD-10-CM | POA: Diagnosis not present

## 2020-10-20 DIAGNOSIS — N186 End stage renal disease: Secondary | ICD-10-CM | POA: Diagnosis not present

## 2020-10-20 DIAGNOSIS — N12 Tubulo-interstitial nephritis, not specified as acute or chronic: Secondary | ICD-10-CM | POA: Diagnosis not present

## 2020-10-22 DIAGNOSIS — N12 Tubulo-interstitial nephritis, not specified as acute or chronic: Secondary | ICD-10-CM | POA: Diagnosis not present

## 2020-10-22 DIAGNOSIS — Z992 Dependence on renal dialysis: Secondary | ICD-10-CM | POA: Diagnosis not present

## 2020-10-22 DIAGNOSIS — N186 End stage renal disease: Secondary | ICD-10-CM | POA: Diagnosis not present

## 2020-10-25 DIAGNOSIS — N12 Tubulo-interstitial nephritis, not specified as acute or chronic: Secondary | ICD-10-CM | POA: Diagnosis not present

## 2020-10-25 DIAGNOSIS — Z992 Dependence on renal dialysis: Secondary | ICD-10-CM | POA: Diagnosis not present

## 2020-10-25 DIAGNOSIS — N186 End stage renal disease: Secondary | ICD-10-CM | POA: Diagnosis not present

## 2020-10-27 DIAGNOSIS — Z992 Dependence on renal dialysis: Secondary | ICD-10-CM | POA: Diagnosis not present

## 2020-10-27 DIAGNOSIS — N186 End stage renal disease: Secondary | ICD-10-CM | POA: Diagnosis not present

## 2020-10-27 DIAGNOSIS — N12 Tubulo-interstitial nephritis, not specified as acute or chronic: Secondary | ICD-10-CM | POA: Diagnosis not present

## 2020-10-29 DIAGNOSIS — N186 End stage renal disease: Secondary | ICD-10-CM | POA: Diagnosis not present

## 2020-10-29 DIAGNOSIS — N12 Tubulo-interstitial nephritis, not specified as acute or chronic: Secondary | ICD-10-CM | POA: Diagnosis not present

## 2020-10-29 DIAGNOSIS — Z992 Dependence on renal dialysis: Secondary | ICD-10-CM | POA: Diagnosis not present

## 2020-11-01 DIAGNOSIS — N12 Tubulo-interstitial nephritis, not specified as acute or chronic: Secondary | ICD-10-CM | POA: Diagnosis not present

## 2020-11-01 DIAGNOSIS — N186 End stage renal disease: Secondary | ICD-10-CM | POA: Diagnosis not present

## 2020-11-01 DIAGNOSIS — Z992 Dependence on renal dialysis: Secondary | ICD-10-CM | POA: Diagnosis not present

## 2020-11-03 DIAGNOSIS — N12 Tubulo-interstitial nephritis, not specified as acute or chronic: Secondary | ICD-10-CM | POA: Diagnosis not present

## 2020-11-03 DIAGNOSIS — Z992 Dependence on renal dialysis: Secondary | ICD-10-CM | POA: Diagnosis not present

## 2020-11-03 DIAGNOSIS — N186 End stage renal disease: Secondary | ICD-10-CM | POA: Diagnosis not present

## 2020-11-05 DIAGNOSIS — Z992 Dependence on renal dialysis: Secondary | ICD-10-CM | POA: Diagnosis not present

## 2020-11-05 DIAGNOSIS — N186 End stage renal disease: Secondary | ICD-10-CM | POA: Diagnosis not present

## 2020-11-08 DIAGNOSIS — Z992 Dependence on renal dialysis: Secondary | ICD-10-CM | POA: Diagnosis not present

## 2020-11-08 DIAGNOSIS — N186 End stage renal disease: Secondary | ICD-10-CM | POA: Diagnosis not present

## 2020-11-10 DIAGNOSIS — N186 End stage renal disease: Secondary | ICD-10-CM | POA: Diagnosis not present

## 2020-11-10 DIAGNOSIS — Z992 Dependence on renal dialysis: Secondary | ICD-10-CM | POA: Diagnosis not present

## 2020-11-12 ENCOUNTER — Encounter (HOSPITAL_COMMUNITY): Payer: Self-pay

## 2020-11-12 ENCOUNTER — Emergency Department (HOSPITAL_COMMUNITY): Payer: Medicare HMO

## 2020-11-12 ENCOUNTER — Other Ambulatory Visit: Payer: Self-pay

## 2020-11-12 ENCOUNTER — Inpatient Hospital Stay (HOSPITAL_COMMUNITY)
Admission: EM | Admit: 2020-11-12 | Discharge: 2020-12-04 | DRG: 480 | Disposition: E | Payer: Medicare HMO | Attending: Internal Medicine | Admitting: Internal Medicine

## 2020-11-12 DIAGNOSIS — T148XXA Other injury of unspecified body region, initial encounter: Secondary | ICD-10-CM | POA: Diagnosis not present

## 2020-11-12 DIAGNOSIS — Z7952 Long term (current) use of systemic steroids: Secondary | ICD-10-CM

## 2020-11-12 DIAGNOSIS — Z992 Dependence on renal dialysis: Secondary | ICD-10-CM

## 2020-11-12 DIAGNOSIS — R579 Shock, unspecified: Secondary | ICD-10-CM | POA: Diagnosis present

## 2020-11-12 DIAGNOSIS — T17908A Unspecified foreign body in respiratory tract, part unspecified causing other injury, initial encounter: Secondary | ICD-10-CM | POA: Diagnosis not present

## 2020-11-12 DIAGNOSIS — L8990 Pressure ulcer of unspecified site, unspecified stage: Secondary | ICD-10-CM | POA: Insufficient documentation

## 2020-11-12 DIAGNOSIS — T82818A Embolism of vascular prosthetic devices, implants and grafts, initial encounter: Secondary | ICD-10-CM | POA: Diagnosis present

## 2020-11-12 DIAGNOSIS — I4891 Unspecified atrial fibrillation: Secondary | ICD-10-CM | POA: Diagnosis not present

## 2020-11-12 DIAGNOSIS — M25552 Pain in left hip: Secondary | ICD-10-CM | POA: Diagnosis present

## 2020-11-12 DIAGNOSIS — E440 Moderate protein-calorie malnutrition: Secondary | ICD-10-CM | POA: Diagnosis present

## 2020-11-12 DIAGNOSIS — I5042 Chronic combined systolic (congestive) and diastolic (congestive) heart failure: Secondary | ICD-10-CM | POA: Diagnosis not present

## 2020-11-12 DIAGNOSIS — Z20822 Contact with and (suspected) exposure to covid-19: Secondary | ICD-10-CM | POA: Diagnosis present

## 2020-11-12 DIAGNOSIS — I953 Hypotension of hemodialysis: Secondary | ICD-10-CM | POA: Diagnosis not present

## 2020-11-12 DIAGNOSIS — Z9049 Acquired absence of other specified parts of digestive tract: Secondary | ICD-10-CM

## 2020-11-12 DIAGNOSIS — N186 End stage renal disease: Secondary | ICD-10-CM | POA: Diagnosis not present

## 2020-11-12 DIAGNOSIS — Z452 Encounter for adjustment and management of vascular access device: Secondary | ICD-10-CM

## 2020-11-12 DIAGNOSIS — Z4682 Encounter for fitting and adjustment of non-vascular catheter: Secondary | ICD-10-CM | POA: Diagnosis not present

## 2020-11-12 DIAGNOSIS — E1143 Type 2 diabetes mellitus with diabetic autonomic (poly)neuropathy: Secondary | ICD-10-CM | POA: Diagnosis present

## 2020-11-12 DIAGNOSIS — Z8249 Family history of ischemic heart disease and other diseases of the circulatory system: Secondary | ICD-10-CM

## 2020-11-12 DIAGNOSIS — G9341 Metabolic encephalopathy: Secondary | ICD-10-CM | POA: Diagnosis not present

## 2020-11-12 DIAGNOSIS — W010XXA Fall on same level from slipping, tripping and stumbling without subsequent striking against object, initial encounter: Secondary | ICD-10-CM | POA: Diagnosis present

## 2020-11-12 DIAGNOSIS — M25562 Pain in left knee: Secondary | ICD-10-CM | POA: Diagnosis not present

## 2020-11-12 DIAGNOSIS — E1122 Type 2 diabetes mellitus with diabetic chronic kidney disease: Secondary | ICD-10-CM | POA: Diagnosis present

## 2020-11-12 DIAGNOSIS — F411 Generalized anxiety disorder: Secondary | ICD-10-CM | POA: Diagnosis present

## 2020-11-12 DIAGNOSIS — D849 Immunodeficiency, unspecified: Secondary | ICD-10-CM | POA: Diagnosis not present

## 2020-11-12 DIAGNOSIS — W19XXXA Unspecified fall, initial encounter: Secondary | ICD-10-CM

## 2020-11-12 DIAGNOSIS — M7989 Other specified soft tissue disorders: Secondary | ICD-10-CM | POA: Diagnosis not present

## 2020-11-12 DIAGNOSIS — R571 Hypovolemic shock: Secondary | ICD-10-CM | POA: Diagnosis present

## 2020-11-12 DIAGNOSIS — Y832 Surgical operation with anastomosis, bypass or graft as the cause of abnormal reaction of the patient, or of later complication, without mention of misadventure at the time of the procedure: Secondary | ICD-10-CM | POA: Diagnosis present

## 2020-11-12 DIAGNOSIS — S7292XA Unspecified fracture of left femur, initial encounter for closed fracture: Secondary | ICD-10-CM | POA: Diagnosis not present

## 2020-11-12 DIAGNOSIS — R6521 Severe sepsis with septic shock: Secondary | ICD-10-CM | POA: Diagnosis not present

## 2020-11-12 DIAGNOSIS — Z6825 Body mass index (BMI) 25.0-25.9, adult: Secondary | ICD-10-CM

## 2020-11-12 DIAGNOSIS — Z95828 Presence of other vascular implants and grafts: Secondary | ICD-10-CM | POA: Diagnosis not present

## 2020-11-12 DIAGNOSIS — I10 Essential (primary) hypertension: Secondary | ICD-10-CM | POA: Diagnosis not present

## 2020-11-12 DIAGNOSIS — Z7982 Long term (current) use of aspirin: Secondary | ICD-10-CM

## 2020-11-12 DIAGNOSIS — M419 Scoliosis, unspecified: Secondary | ICD-10-CM | POA: Diagnosis not present

## 2020-11-12 DIAGNOSIS — T8612 Kidney transplant failure: Secondary | ICD-10-CM | POA: Diagnosis present

## 2020-11-12 DIAGNOSIS — E785 Hyperlipidemia, unspecified: Secondary | ICD-10-CM | POA: Diagnosis present

## 2020-11-12 DIAGNOSIS — E11649 Type 2 diabetes mellitus with hypoglycemia without coma: Secondary | ICD-10-CM | POA: Diagnosis not present

## 2020-11-12 DIAGNOSIS — Z4659 Encounter for fitting and adjustment of other gastrointestinal appliance and device: Secondary | ICD-10-CM

## 2020-11-12 DIAGNOSIS — Z794 Long term (current) use of insulin: Secondary | ICD-10-CM

## 2020-11-12 DIAGNOSIS — A419 Sepsis, unspecified organism: Secondary | ICD-10-CM | POA: Diagnosis not present

## 2020-11-12 DIAGNOSIS — I2510 Atherosclerotic heart disease of native coronary artery without angina pectoris: Secondary | ICD-10-CM | POA: Diagnosis present

## 2020-11-12 DIAGNOSIS — E871 Hypo-osmolality and hyponatremia: Secondary | ICD-10-CM | POA: Diagnosis not present

## 2020-11-12 DIAGNOSIS — I272 Pulmonary hypertension, unspecified: Secondary | ICD-10-CM | POA: Diagnosis present

## 2020-11-12 DIAGNOSIS — J690 Pneumonitis due to inhalation of food and vomit: Secondary | ICD-10-CM | POA: Diagnosis not present

## 2020-11-12 DIAGNOSIS — E119 Type 2 diabetes mellitus without complications: Secondary | ICD-10-CM

## 2020-11-12 DIAGNOSIS — J9600 Acute respiratory failure, unspecified whether with hypoxia or hypercapnia: Secondary | ICD-10-CM | POA: Diagnosis not present

## 2020-11-12 DIAGNOSIS — D631 Anemia in chronic kidney disease: Secondary | ICD-10-CM | POA: Diagnosis not present

## 2020-11-12 DIAGNOSIS — N250 Renal osteodystrophy: Secondary | ICD-10-CM | POA: Diagnosis not present

## 2020-11-12 DIAGNOSIS — D62 Acute posthemorrhagic anemia: Secondary | ICD-10-CM | POA: Diagnosis present

## 2020-11-12 DIAGNOSIS — S72402A Unspecified fracture of lower end of left femur, initial encounter for closed fracture: Secondary | ICD-10-CM | POA: Diagnosis not present

## 2020-11-12 DIAGNOSIS — Z79899 Other long term (current) drug therapy: Secondary | ICD-10-CM

## 2020-11-12 DIAGNOSIS — Z0189 Encounter for other specified special examinations: Secondary | ICD-10-CM

## 2020-11-12 DIAGNOSIS — Y830 Surgical operation with transplant of whole organ as the cause of abnormal reaction of the patient, or of later complication, without mention of misadventure at the time of the procedure: Secondary | ICD-10-CM | POA: Diagnosis present

## 2020-11-12 DIAGNOSIS — J9601 Acute respiratory failure with hypoxia: Secondary | ICD-10-CM | POA: Diagnosis not present

## 2020-11-12 DIAGNOSIS — Z419 Encounter for procedure for purposes other than remedying health state, unspecified: Secondary | ICD-10-CM

## 2020-11-12 DIAGNOSIS — S72492A Other fracture of lower end of left femur, initial encounter for closed fracture: Secondary | ICD-10-CM | POA: Diagnosis not present

## 2020-11-12 DIAGNOSIS — Z515 Encounter for palliative care: Secondary | ICD-10-CM

## 2020-11-12 DIAGNOSIS — K567 Ileus, unspecified: Secondary | ICD-10-CM | POA: Diagnosis not present

## 2020-11-12 DIAGNOSIS — K3184 Gastroparesis: Secondary | ICD-10-CM | POA: Diagnosis present

## 2020-11-12 DIAGNOSIS — Z66 Do not resuscitate: Secondary | ICD-10-CM | POA: Diagnosis not present

## 2020-11-12 DIAGNOSIS — Z01818 Encounter for other preprocedural examination: Secondary | ICD-10-CM

## 2020-11-12 DIAGNOSIS — Z978 Presence of other specified devices: Secondary | ICD-10-CM | POA: Diagnosis present

## 2020-11-12 DIAGNOSIS — Z7984 Long term (current) use of oral hypoglycemic drugs: Secondary | ICD-10-CM

## 2020-11-12 DIAGNOSIS — S72462A Displaced supracondylar fracture with intracondylar extension of lower end of left femur, initial encounter for closed fracture: Secondary | ICD-10-CM | POA: Diagnosis not present

## 2020-11-12 DIAGNOSIS — I959 Hypotension, unspecified: Secondary | ICD-10-CM | POA: Diagnosis present

## 2020-11-12 DIAGNOSIS — E875 Hyperkalemia: Secondary | ICD-10-CM | POA: Diagnosis not present

## 2020-11-12 DIAGNOSIS — Z7902 Long term (current) use of antithrombotics/antiplatelets: Secondary | ICD-10-CM

## 2020-11-12 DIAGNOSIS — I132 Hypertensive heart and chronic kidney disease with heart failure and with stage 5 chronic kidney disease, or end stage renal disease: Secondary | ICD-10-CM | POA: Diagnosis not present

## 2020-11-12 DIAGNOSIS — Y92008 Other place in unspecified non-institutional (private) residence as the place of occurrence of the external cause: Secondary | ICD-10-CM | POA: Diagnosis not present

## 2020-11-12 DIAGNOSIS — I700 Atherosclerosis of aorta: Secondary | ICD-10-CM | POA: Diagnosis not present

## 2020-11-12 DIAGNOSIS — I509 Heart failure, unspecified: Secondary | ICD-10-CM | POA: Diagnosis not present

## 2020-11-12 DIAGNOSIS — Z833 Family history of diabetes mellitus: Secondary | ICD-10-CM

## 2020-11-12 DIAGNOSIS — Z7901 Long term (current) use of anticoagulants: Secondary | ICD-10-CM

## 2020-11-12 DIAGNOSIS — R937 Abnormal findings on diagnostic imaging of other parts of musculoskeletal system: Secondary | ICD-10-CM | POA: Diagnosis not present

## 2020-11-12 LAB — COMPREHENSIVE METABOLIC PANEL
ALT: 34 U/L (ref 0–44)
AST: 27 U/L (ref 15–41)
Albumin: 2.7 g/dL — ABNORMAL LOW (ref 3.5–5.0)
Alkaline Phosphatase: 101 U/L (ref 38–126)
Anion gap: 19 — ABNORMAL HIGH (ref 5–15)
BUN: 91 mg/dL — ABNORMAL HIGH (ref 8–23)
CO2: 19 mmol/L — ABNORMAL LOW (ref 22–32)
Calcium: 8.1 mg/dL — ABNORMAL LOW (ref 8.9–10.3)
Chloride: 101 mmol/L (ref 98–111)
Creatinine, Ser: 12.96 mg/dL — ABNORMAL HIGH (ref 0.44–1.00)
GFR, Estimated: 3 mL/min — ABNORMAL LOW (ref >60)
Glucose, Bld: 219 mg/dL — ABNORMAL HIGH (ref 70–99)
Potassium: 6 mmol/L — ABNORMAL HIGH (ref 3.5–5.1)
Sodium: 139 mmol/L (ref 135–145)
Total Bilirubin: 0.5 mg/dL (ref 0.3–1.2)
Total Protein: 6.6 g/dL (ref 6.5–8.1)

## 2020-11-12 LAB — CBC WITH DIFFERENTIAL/PLATELET
Abs Immature Granulocytes: 0.16 10*3/uL — ABNORMAL HIGH (ref 0.00–0.07)
Basophils Absolute: 0.1 10*3/uL (ref 0.0–0.1)
Basophils Relative: 1 %
Eosinophils Absolute: 0.9 10*3/uL — ABNORMAL HIGH (ref 0.0–0.5)
Eosinophils Relative: 5 %
HCT: 25 % — ABNORMAL LOW (ref 36.0–46.0)
Hemoglobin: 7.4 g/dL — ABNORMAL LOW (ref 12.0–15.0)
Immature Granulocytes: 1 %
Lymphocytes Relative: 4 %
Lymphs Abs: 0.8 10*3/uL (ref 0.7–4.0)
MCH: 28.6 pg (ref 26.0–34.0)
MCHC: 29.6 g/dL — ABNORMAL LOW (ref 30.0–36.0)
MCV: 96.5 fL (ref 80.0–100.0)
Monocytes Absolute: 1.2 10*3/uL — ABNORMAL HIGH (ref 0.1–1.0)
Monocytes Relative: 6 %
Neutro Abs: 16.1 10*3/uL — ABNORMAL HIGH (ref 1.7–7.7)
Neutrophils Relative %: 83 %
Platelets: 227 10*3/uL (ref 150–400)
RBC: 2.59 MIL/uL — ABNORMAL LOW (ref 3.87–5.11)
RDW: 17.1 % — ABNORMAL HIGH (ref 11.5–15.5)
WBC: 19.2 10*3/uL — ABNORMAL HIGH (ref 4.0–10.5)
nRBC: 0.1 % (ref 0.0–0.2)

## 2020-11-12 LAB — RESP PANEL BY RT-PCR (FLU A&B, COVID) ARPGX2
Influenza A by PCR: NEGATIVE
Influenza B by PCR: NEGATIVE
SARS Coronavirus 2 by RT PCR: NEGATIVE

## 2020-11-12 LAB — HIV ANTIBODY (ROUTINE TESTING W REFLEX): HIV Screen 4th Generation wRfx: NONREACTIVE

## 2020-11-12 LAB — HEMOGLOBIN A1C
Hgb A1c MFr Bld: 6.7 % — ABNORMAL HIGH (ref 4.8–5.6)
Mean Plasma Glucose: 145.59 mg/dL

## 2020-11-12 LAB — GLUCOSE, CAPILLARY: Glucose-Capillary: 197 mg/dL — ABNORMAL HIGH (ref 70–99)

## 2020-11-12 MED ORDER — SEVELAMER CARBONATE 800 MG PO TABS
1600.0000 mg | ORAL_TABLET | Freq: Two times a day (BID) | ORAL | Status: DC | PRN
  Filled 2020-11-12: qty 2

## 2020-11-12 MED ORDER — HYDROMORPHONE HCL 1 MG/ML IJ SOLN
0.5000 mg | Freq: Once | INTRAMUSCULAR | Status: AC
  Administered 2020-11-12: 0.5 mg via INTRAVENOUS
  Filled 2020-11-12: qty 1

## 2020-11-12 MED ORDER — ACETAMINOPHEN 650 MG RE SUPP: 650.0000 mg | Freq: Four times a day (QID) | RECTAL | Status: DC | PRN

## 2020-11-12 MED ORDER — ACETAMINOPHEN 325 MG PO TABS
650.0000 mg | ORAL_TABLET | Freq: Four times a day (QID) | ORAL | Status: DC | PRN
  Administered 2020-11-12: 650 mg via ORAL
  Filled 2020-11-12: qty 2

## 2020-11-12 MED ORDER — LIDOCAINE-PRILOCAINE 2.5-2.5 % EX CREA
1.0000 "application " | TOPICAL_CREAM | CUTANEOUS | Status: DC | PRN
  Filled 2020-11-12: qty 5

## 2020-11-12 MED ORDER — SODIUM CHLORIDE 0.9% FLUSH
3.0000 mL | Freq: Two times a day (BID) | INTRAVENOUS | Status: DC
  Administered 2020-11-12 – 2020-11-17 (×8): 3 mL via INTRAVENOUS
  Administered 2020-11-18: 10 mL via INTRAVENOUS

## 2020-11-12 MED ORDER — RENA-VITE PO TABS
1.0000 | ORAL_TABLET | Freq: Every day | ORAL | Status: DC
  Administered 2020-11-14: 1 via ORAL
  Filled 2020-11-12 (×2): qty 1

## 2020-11-12 MED ORDER — ONDANSETRON HCL 4 MG/2ML IJ SOLN
4.0000 mg | Freq: Four times a day (QID) | INTRAMUSCULAR | Status: DC | PRN
  Administered 2020-11-12 – 2020-11-18 (×4): 4 mg via INTRAVENOUS
  Filled 2020-11-12 (×4): qty 2

## 2020-11-12 MED ORDER — METOCLOPRAMIDE HCL 5 MG PO TABS
5.0000 mg | ORAL_TABLET | Freq: Three times a day (TID) | ORAL | Status: DC
  Administered 2020-11-14: 5 mg via ORAL
  Filled 2020-11-12 (×5): qty 1

## 2020-11-12 MED ORDER — CEFAZOLIN SODIUM-DEXTROSE 2-4 GM/100ML-% IV SOLN
2.0000 g | Freq: Once | INTRAVENOUS | Status: DC
  Filled 2020-11-12: qty 100

## 2020-11-12 MED ORDER — MIRTAZAPINE 15 MG PO TABS
15.0000 mg | ORAL_TABLET | Freq: Every day | ORAL | Status: DC
  Administered 2020-11-12 – 2020-11-13 (×2): 15 mg via ORAL
  Filled 2020-11-12 (×2): qty 1

## 2020-11-12 MED ORDER — ASPIRIN EC 81 MG PO TBEC
81.0000 mg | DELAYED_RELEASE_TABLET | Freq: Every day | ORAL | Status: DC
  Administered 2020-11-14 – 2020-11-15 (×2): 81 mg via ORAL
  Filled 2020-11-12 (×2): qty 1

## 2020-11-12 MED ORDER — CEFAZOLIN SODIUM-DEXTROSE 2-4 GM/100ML-% IV SOLN
2.0000 g | Freq: Once | INTRAVENOUS | Status: AC
  Administered 2020-11-12: 2 g via INTRAVENOUS

## 2020-11-12 MED ORDER — SODIUM CHLORIDE 0.9 % IV SOLN: 100.0000 mL | INTRAVENOUS | Status: DC | PRN

## 2020-11-12 MED ORDER — ONDANSETRON HCL 4 MG/2ML IJ SOLN
4.0000 mg | Freq: Once | INTRAMUSCULAR | Status: AC
  Administered 2020-11-12: 4 mg via INTRAVENOUS
  Filled 2020-11-12: qty 2

## 2020-11-12 MED ORDER — PENTAFLUOROPROP-TETRAFLUOROETH EX AERO
1.0000 "application " | INHALATION_SPRAY | CUTANEOUS | Status: DC | PRN
  Filled 2020-11-12: qty 30

## 2020-11-12 MED ORDER — CLONIDINE HCL 0.1 MG PO TABS: 0.1000 mg | ORAL_TABLET | Freq: Every day | ORAL | Status: DC

## 2020-11-12 MED ORDER — SERTRALINE HCL 100 MG PO TABS
100.0000 mg | ORAL_TABLET | Freq: Every day | ORAL | Status: DC
  Administered 2020-11-12 – 2020-11-14 (×3): 100 mg via ORAL
  Filled 2020-11-12 (×2): qty 2
  Filled 2020-11-12: qty 1

## 2020-11-12 MED ORDER — TACROLIMUS 0.5 MG PO CAPS
0.5000 mg | ORAL_CAPSULE | Freq: Every day | ORAL | Status: DC
  Filled 2020-11-12 (×3): qty 1

## 2020-11-12 MED ORDER — INSULIN ASPART 100 UNIT/ML IJ SOLN
0.0000 [IU] | Freq: Every day | INTRAMUSCULAR | Status: DC
Start: 2020-11-12 — End: 2020-11-13

## 2020-11-12 MED ORDER — PREDNISONE 10 MG PO TABS
5.0000 mg | ORAL_TABLET | Freq: Every day | ORAL | Status: DC
  Administered 2020-11-14 – 2020-11-15 (×2): 5 mg via ORAL
  Filled 2020-11-12 (×2): qty 1

## 2020-11-12 MED ORDER — PANTOPRAZOLE SODIUM 40 MG PO TBEC: 40.0000 mg | DELAYED_RELEASE_TABLET | Freq: Every day | ORAL | Status: DC

## 2020-11-12 MED ORDER — ONDANSETRON HCL 4 MG PO TABS: 4.0000 mg | ORAL_TABLET | Freq: Four times a day (QID) | ORAL | Status: DC | PRN

## 2020-11-12 MED ORDER — METOPROLOL SUCCINATE ER 25 MG PO TB24: 25.0000 mg | ORAL_TABLET | Freq: Every day | ORAL | Status: DC

## 2020-11-12 MED ORDER — CHLORHEXIDINE GLUCONATE CLOTH 2 % EX PADS
6.0000 | MEDICATED_PAD | Freq: Every day | CUTANEOUS | Status: DC
  Administered 2020-11-13: 6 via TOPICAL

## 2020-11-12 MED ORDER — ARIPIPRAZOLE 5 MG PO TABS
5.0000 mg | ORAL_TABLET | Freq: Every day | ORAL | Status: DC
  Administered 2020-11-12 – 2020-11-14 (×3): 5 mg via ORAL
  Filled 2020-11-12 (×3): qty 1

## 2020-11-12 MED ORDER — SODIUM CHLORIDE 0.9% FLUSH: 3.0000 mL | INTRAVENOUS | Status: DC | PRN

## 2020-11-12 MED ORDER — AMIODARONE HCL 200 MG PO TABS
100.0000 mg | ORAL_TABLET | Freq: Every day | ORAL | Status: DC
  Administered 2020-11-14 – 2020-11-15 (×2): 100 mg via ORAL
  Filled 2020-11-12 (×2): qty 1

## 2020-11-12 MED ORDER — HEPARIN SODIUM (PORCINE) 5000 UNIT/ML IJ SOLN
5000.0000 [IU] | Freq: Three times a day (TID) | INTRAMUSCULAR | Status: DC
  Administered 2020-11-12 – 2020-11-13 (×4): 5000 [IU] via SUBCUTANEOUS
  Filled 2020-11-12 (×4): qty 1

## 2020-11-12 MED ORDER — ROSUVASTATIN CALCIUM 20 MG PO TABS: 20.0000 mg | ORAL_TABLET | Freq: Every day | ORAL | Status: DC

## 2020-11-12 MED ORDER — SEVELAMER CARBONATE 800 MG PO TABS: 3200.0000 mg | ORAL_TABLET | Freq: Three times a day (TID) | ORAL | Status: DC

## 2020-11-12 MED ORDER — INSULIN ASPART 100 UNIT/ML IJ SOLN
0.0000 [IU] | Freq: Three times a day (TID) | INTRAMUSCULAR | Status: DC
Start: 2020-11-12 — End: 2020-11-13

## 2020-11-12 MED ORDER — LIDOCAINE HCL (PF) 1 % IJ SOLN: 5.0000 mL | INTRAMUSCULAR | Status: DC | PRN

## 2020-11-12 MED ORDER — HYDROMORPHONE HCL 1 MG/ML IJ SOLN
0.5000 mg | INTRAMUSCULAR | Status: DC | PRN
  Administered 2020-11-12 – 2020-11-13 (×5): 0.5 mg via INTRAVENOUS
  Filled 2020-11-12 (×3): qty 0.5
  Filled 2020-11-12: qty 1
  Filled 2020-11-12: qty 0.5

## 2020-11-12 MED ORDER — SENNA 8.6 MG PO TABS
1.0000 | ORAL_TABLET | Freq: Two times a day (BID) | ORAL | Status: DC
  Administered 2020-11-13 – 2020-11-14 (×2): 8.6 mg via ORAL
  Filled 2020-11-12 (×3): qty 1

## 2020-11-12 MED ORDER — SODIUM ZIRCONIUM CYCLOSILICATE 5 G PO PACK
10.0000 g | PACK | Freq: Once | ORAL | Status: AC
  Administered 2020-11-12: 10 g via ORAL
  Filled 2020-11-12: qty 2

## 2020-11-12 MED ORDER — SODIUM CHLORIDE 0.9 % IV SOLN
250.0000 mL | INTRAVENOUS | Status: DC | PRN
  Administered 2020-11-18: 250 mL via INTRAVENOUS
  Administered 2020-11-18: 175 mL via INTRAVENOUS
  Administered 2020-11-19: 250 mL via INTRAVENOUS

## 2020-11-12 NOTE — ED Triage Notes (Signed)
Pt reports fall yesterday with L knee pain.  Denies hitting head.  Denies loc.  Pt reports walker went out from beneath her.  L knee is bruised and swollen.  Resp even and unlabored.  Skin warm and dry.  Reports tylenol pta.

## 2020-11-12 NOTE — Consult Note (Signed)
NEW PROBLEM//OUTPATIENT/CONSULT-ED   REQ BY DR ZAMMIT   REASON FRACTURE LEFT FEMUR   Summary assessment and plan:   I DISCUSSED WITH DR ZAMMIT AND DR HADDIX  65 YO TO BE DIALYZED TO DAY, ALSO ON ELIQUIS, K IS 6, HG IS 7  I PLACED A KNEE IMMOB AND CLEANED AND DRESSED THE ABRASION OVER THE KNEE   THIS IS DOES NOT LOOK LIKE AN OPEN FRACTURE (SEE MEDIA PICS)   I M GIVING ANCEF 2 GM APOPHYLACTICALLY  DR HADDIX AGREES TO SEE ONCE SHE GOES TO [place] AND PROBABLY WILL DO SURGERY ON Monday   Chief Complaint  Patient presents with   Fall   65YO FEM H/O RENAL TRANSPLANT WHICH LASTED 7 YRS NO W ON DIALYSIS AND ELIQUIS; USES A WALKER TO AMBULATE, FELL AT HOME TODAY 11/21/2020, FRACTURED SUPRACONDYLAR AREA LEFT KNEE  C/O SEVERE LEFT KNEE PAIN, AND GAIT UNABLE TO WALK. THE PAIN IS SHARP AND NON RADIATING    MEDICAL DECISION MAKING  A. SUPRACONDYLAR INTRA-ARTICULAR FEMUR FRX LEFT KNEE , CLOSED   B. DATA ANALYSED: CBC Latest Ref Rng & Units 11/22/2020 07/26/2020 01/07/2020  WBC 4.0 - 10.5 K/uL 19.2(H) 10.1 11.6(H)  Hemoglobin 12.0 - 15.0 g/dL 7.4(L) 9.1(L) 10.7(L)  Hematocrit 36.0 - 46.0 % 25.0(L) 29.9(L) 35.3(L)  Platelets 150 - 400 K/uL 227 178 201   BMP Latest Ref Rng & Units 11/04/2020 07/26/2020 02/19/2020  Glucose 70 - 99 mg/dL 219(H) 97 162(H)  BUN 8 - 23 mg/dL 91(H) 46(H) 28(H)  Creatinine 0.44 - 1.00 mg/dL 12.96(H) 9.37(H) 7.32(HH)  BUN/Creat Ratio 12 - 28 - - 4(L)  Sodium 135 - 145 mmol/L 139 137 143  Potassium 3.5 - 5.1 mmol/L 6.0(H) 4.5 4.8  Chloride 98 - 111 mmol/L 101 103 99  CO2 22 - 32 mmol/L 19(L) 27 19(L)  Calcium 8.9 - 10.3 mg/dL 8.1(L) 7.9(L) 8.7     IMAGING: Interpretation of images: PLAIN FILMS AND CT   XRAYS INTRAARTICULAR FRX SUPRACONDYLAR LEFT FEMUR MIN ANGULATION MILD DISPLACED   CT CONFIRMS JOINT INVOLVEMENT  Orders: [facility]   Outside records reviewed: NONE    C. MANAGEMENT   IV ANCEF  CLEAN THE WOUND AND APPLY DRESSING    Meds ordered this encounter  Medications   HYDROmorphone (DILAUDID) injection 0.5 mg   ondansetron (ZOFRAN) injection 4 mg   sodium zirconium cyclosilicate (LOKELMA) packet 10 g   Chlorhexidine Gluconate Cloth 2 % PADS 6 each   ceFAZolin (ANCEF) IVPB 2g/100 mL premix    Order Specific Question:   Antibiotic Indication:    Answer:   Other Indication (list below)    Order Specific Question:   Other Indication:    Answer:   abrasion over knee with underlying fracture   HYDROmorphone (DILAUDID) injection 0.5 mg     BP (!) 132/45 (BP Location: Left Arm)   Pulse (!) 54   Temp 97.7 F (36.5 C) (Oral)   Resp 13   Ht '5\' 9"'$  (1.753 m)   Wt 65.8 kg   SpO2 96%   BMI 21.41 kg/m    General appearance: Well-developed well-nourished no gross deformities  Cardiovascular FAINT B/L DP AND PT  pulse and NORMAL perfusion normal color without edema  Neurologically no sensation loss or deficits or pathologic reflexes, BAL CAN NOT ASSESS;   Psychological: Awake alert and oriented x3 mood and affect normal  Skin ABRASION OVER LEFT KNEE 1.5 CM, SUPERFICIAL NO ulcerations no nodularity no palpable masses, no erythema  or nodularity  Musculoskeletal:   LEFT KNEE  SKIN ABRASION/LAC ITS SUPERFICIAL  TENDER LEFT PROX FEMUR  NO MOTION BEC PAIN  STAB DEFER 2ND PAIN  MOTOR DF PF INTACT   UPPER EXTREMS NORMAL ALIGNMENT ROM STAB AND STRENGTH NO TENDERNESS SKIN NORMAL OTHER THAN BRUISING AND SOME SCRATCHES   RIGHT LE: TENDER BRUISED RIGHT KNEE ROM 0-80 STABLE W AROM  MOTOR NORMAL SKIN INTACT SOME SCRATCHES ? ON THE LEG    Review of Systems  Reason unable to perform ROS: MEDICATED.    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   Last Assessment & Plan:  - Continuing home suppression therapy: Bactrim M-W-F, Prednisone 5 mg daily, Myfortic 360 mg BID and Prograf 1 mg BID - Appreciate Nephrology recs   Diabetes mellitus    Dyslipidemia    Edema of lower extremity    with hypo-albuminemia and profound protenuria   HCAP (healthcare-associated pneumonia)    Heart murmur    Hypertension    Lung collapse 12/15/2018   Mitral regurgitation    Nephrotic syndrome    Patent foramen ovale    Pulmonary edema 08/10/2019   Pulmonary hypertension, moderate to severe (HCC)    Pulmonary nodule    Renal insufficiency    Tricuspid regurgitation    Volume depletion, renal, due to output loss (renal deficit)     Past Surgical History:  Procedure Laterality Date   A/V FISTULAGRAM N/A 04/05/2017   Procedure: A/V FISTULAGRAM - Left Arm;  Surgeon: Angelia Mould, MD;  Location: [facility];  Service: Cardiovascular;  Laterality: N/A;   A/V FISTULAGRAM Left 09/18/2017   Procedure: A/V FISTULAGRAM;  Surgeon: Serafina Mitchell, MD;  Location: [facility];  Service: Cardiovascular;  Laterality: Left;   A/V SHUNT INTERVENTION Left 04/05/2017   Procedure: A/V SHUNT INTERVENTION;  Surgeon: Angelia Mould, MD;  Location: [facility];  Service: Cardiovascular;  Laterality: Left;   CARDIAC CATHETERIZATION  2008   IR REMOVAL TUN CV CATH W/O FL  11/02/2016   KIDNEY TRANSPLANT Right 02/23/2009   PERIPHERAL VASCULAR BALLOON ANGIOPLASTY Left 09/18/2017   Procedure: PERIPHERAL VASCULAR BALLOON ANGIOPLASTY;  Surgeon: Serafina Mitchell, MD;  Location: [facility];  Service: Cardiovascular;  Laterality:  Left;  Arm Fistula    Family History  Problem Relation Age of Onset   Kidney disease Mother    Diabetes Mother    Diabetes Father    Heart disease Father    Social History   Tobacco Use   Smoking status: Never   Smokeless tobacco: Never  Vaping Use   Vaping Use: Never used  Substance Use Topics   Alcohol use: No   Drug use: No    Allergies  Allergen Reactions   Other Other (See Comments)    Tape Bruises and tears skin, Paper tape tolerated   Tape Other (See Comments)  Bruises and tears skin Paper tape tolerated Bruises and tears skin Paper tape tolerated Bruises and tears skin Paper tape tolerated    No outpatient medications have been marked as taking for the 11/04/2020 encounter [facility]counter).        Arther Abbott, MD  12/01/2020 2:16 PM

## 2020-11-12 NOTE — Progress Notes (Signed)
Was notified that this patient is in the ER with knee pain.  I was told she has a fracture and will likely need surgery.  Today is her HD day-  her K is 6 and BUN is 91.  As of now, we do not know that timing of surgery or where it might be done  I have informed [name], RN and we will make plans to run her dialysis here today/tonight  She goes to [facility]-  orders are as follows   MWF-  4 hours AVF-  16 gauge- 300 BFR 2/2.5 bath/300 rev/EDW 68.5-  no heparin Calcitriol 0.75 TIW, epo 12,000 TIW and venofer 50 weekly   Louis Meckel

## 2020-11-12 NOTE — Plan of Care (Signed)
  Problem: Education: Goal: Knowledge of General Education information will improve Description: Including pain rating scale, medication(s)/side effects and non-pharmacologic comfort measures Outcome: Progressing   Problem: Clinical Measurements: Goal: Ability to maintain clinical measurements within normal limits will improve Outcome: Progressing Goal: Will remain free from infection Outcome: Progressing Goal: Respiratory complications will improve Outcome: Progressing Goal: Cardiovascular complication will be avoided Outcome: Progressing   Problem: Nutrition: Goal: Adequate nutrition will be maintained Outcome: Progressing   Problem: Coping: Goal: Level of anxiety will decrease Outcome: Progressing   Problem: Elimination: Goal: Will not experience complications related to bowel motility Outcome: Progressing Goal: Will not experience complications related to urinary retention Outcome: Progressing   Problem: Pain Managment: Goal: General experience of comfort will improve Outcome: Progressing   Problem: Safety: Goal: Ability to remain free from injury will improve Outcome: Progressing   Problem: Skin Integrity: Goal: Risk for impaired skin integrity will decrease Outcome: Progressing

## 2020-11-12 NOTE — H&P (Addendum)
History and Physical    [name] [id number] DOB: [birth year] DOA: 11/09/2020  PCP: Claretta Fraise, MD   Patient coming from: Home  Chief Complaint: L knee pain  HPI: [name] is a 65 y.o. female with medical history significant for chronic systolic heart failure, type 2 diabetes with nephropathy, end-stage renal disease on HD MWF, hypertension, hyperlipidemia, and history of renal transplant who presented to the ED for left knee pain after sustaining a fall yesterday.  She reports that her walker went out from beneath her and her knee became bruised and swollen.  Today is her hemodialysis today and she has not yet had hemodialysis.   ED Course: Stable vital signs noted and patient afebrile.  She is noted to have leukocytosis of 19,200, hemoglobin 7.4, and potassium 6.0.  BUN is 91 and creatinine is 12.96.  Left knee comminuted distal femur fracture noted with intra-articular involvement.  Orthopedics plans for operative intervention by 9/12 at [facility].  Review of Systems: Reviewed as noted above, otherwise negative.  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   Last Assessment & Plan:  - Continuing home suppression therapy: Bactrim M-W-F, Prednisone 5 mg daily, Myfortic 360 mg BID and Prograf 1 mg BID - Appreciate Nephrology recs   Diabetes mellitus    Dyslipidemia    Edema of lower extremity    with  hypo-albuminemia and profound protenuria   HCAP (healthcare-associated pneumonia)    Heart murmur    Hypertension    Lung collapse 12/15/2018   Mitral regurgitation    Nephrotic syndrome    Patent foramen ovale    Pulmonary edema 08/10/2019   Pulmonary hypertension, moderate to severe (HCC)    Pulmonary nodule    Renal insufficiency    Tricuspid regurgitation    Volume depletion, renal, due to output loss (renal deficit)     Past Surgical History:  Procedure Laterality Date   A/V FISTULAGRAM N/A 04/05/2017   Procedure: A/V FISTULAGRAM - Left Arm;  Surgeon: Angelia Mould, MD;  Location: [facility];  Service: Cardiovascular;  Laterality: N/A;   A/V FISTULAGRAM Left 09/18/2017   Procedure: A/V FISTULAGRAM;  Surgeon: Serafina Mitchell, MD;  Location: [facility];  Service: Cardiovascular;  Laterality: Left;   A/V SHUNT INTERVENTION Left 04/05/2017   Procedure: A/V SHUNT INTERVENTION;  Surgeon: Angelia Mould, MD;  Location: [facility];  Service: Cardiovascular;  Laterality: Left;   CARDIAC CATHETERIZATION  2008   IR REMOVAL TUN CV CATH W/O FL  11/02/2016   KIDNEY TRANSPLANT Right 02/23/2009   PERIPHERAL VASCULAR BALLOON ANGIOPLASTY Left 09/18/2017   Procedure: PERIPHERAL VASCULAR BALLOON ANGIOPLASTY;  Surgeon: Serafina Mitchell, MD;  Location: [facility];  Service: Cardiovascular;  Laterality: Left;  Arm Fistula     reports that she has never smoked. She has never used smokeless tobacco. She reports that  she does not drink alcohol and does not use drugs.  Allergies  Allergen Reactions   Other Other (See Comments)    Tape Bruises and tears skin, Paper tape tolerated   Tape Other (See Comments)    Bruises and tears skin Paper tape tolerated Bruises and tears skin Paper tape tolerated Bruises and tears skin Paper tape tolerated    Family History  Problem Relation Age of Onset   Kidney disease Mother    Diabetes Mother    Diabetes  Father    Heart disease Father     Prior to Admission medications   Medication Sig Start Date End Date Taking? Authorizing Provider  acetaminophen (TYLENOL) 500 MG tablet Take 500 mg by mouth every 6 (six) hours as needed for moderate pain or headache.  04/06/16   [provider]  amiodarone (PACERONE) 100 MG tablet Take 100 mg by mouth daily. 07/09/20   [provider]  amiodarone (PACERONE) 400 MG tablet Take 1 tablet (400 mg total) by mouth daily. Patient not taking: No sig reported 07/08/20   Claretta Fraise, MD  amLODipine (NORVASC) 5 MG tablet Hold until follow-up with PCP or cardiology Patient not taking: No sig reported 06/26/20   [provider]  apixaban (ELIQUIS) 2.5 MG TABS tablet Take 1 tablet (2.5 mg total) by mouth 2 (two) times daily. 07/08/20   Claretta Fraise, MD  ARIPiprazole (ABILIFY) 5 MG tablet Take 1 tablet (5 mg total) by mouth at bedtime. 04/27/20   Claretta Fraise, MD  aspirin (ASPIR-LOW) 81 MG EC tablet Take 1 tablet (81 mg total) by mouth daily with breakfast. 05/27/19   Emokpae, Courage, MD  B Complex-C-Folic Acid (RENA-VITE RX) 1 MG TABS Take 1 tablet by mouth daily. 09/24/18   [provider]  BRILINTA 90 MG TABS tablet Take 90 mg by mouth 2 (two) times daily. 05/13/20   [provider]  cloNIDine (CATAPRES) 0.1 MG tablet Take 1 tablet (0.1 mg total) by mouth daily. 01/13/20   Evelina Dun A, FNP  clopidogrel (PLAVIX) 75 MG tablet Take 75 mg by mouth daily. 05/07/20   [provider]  diclofenac Sodium (VOLTAREN) 1 % GEL Apply 2 g topically 4 (four) times daily. 02/13/20   Ivy Lynn, NP  Dulaglutide (TRULICITY) 1.5 0000000 SOPN INJECT 1.'5MG'$   SUBCUTANEOUSLY ONCE A WEEK. 10/19/20   Claretta Fraise, MD  insulin degludec (TRESIBA FLEXTOUCH) 100 UNIT/ML FlexTouch Pen Inject 15 Units into the skin daily. 04/27/20   Claretta Fraise, MD  isosorbide mononitrate (IMDUR) 30 MG 24 hr tablet Hold until follow-up with PCP or cardiology   (previous dose: 30 daily) Patient not taking: No sig reported 06/26/20   [provider]  isosorbide mononitrate (IMDUR) 60 MG 24 hr tablet Take 1 tablet (60 mg total) by mouth daily. 01/13/20   Evelina Dun A, FNP  Lidocaine 4 % PTCH Place onto the skin. 07/16/20   [provider]  lisinopril (ZESTRIL) 10 MG tablet Hold until follow-up with PCP  (usual dose 10 mg daily) Patient not taking: No sig reported 06/26/20   [provider]  metoCLOPramide (REGLAN) 5 MG tablet Take 1 tablet (5 mg total) by mouth 4 (four) times daily -  before meals and at bedtime. Patient taking differently: Take 5 mg by mouth 3 (three) times daily before meals. 12/24/19   Rolland Porter, MD  metoprolol succinate (TOPROL-XL) 25 MG 24 hr tablet Take 1 tablet (25 mg total) by mouth daily. 01/13/20   Evelina Dun  A, FNP  mirtazapine (REMERON) 15 MG tablet Take 1 tablet (15 mg total) by mouth at bedtime. For sleep 10/19/20   Claretta Fraise, MD  nitroGLYCERIN (NITROSTAT) 0.4 MG SL tablet Place under the tongue. 05/18/20   [provider]  ondansetron (ZOFRAN-ODT) 4 MG disintegrating tablet Take 4 mg by mouth every 8 (eight) hours as needed. 07/06/20   [provider]  pantoprazole (PROTONIX) 40 MG tablet Take 1 tablet by mouth daily. 06/27/20   [provider]  predniSONE (DELTASONE) 5 MG tablet Take 1 tablet (5 mg total) by mouth daily with breakfast. 07/22/19   Barton [place], MD  PROGRAF 0.5 MG capsule Take 0.5 mg by mouth daily.  10/24/16   [provider]  rosuvastatin (CRESTOR) 20 MG tablet Take 1 tablet (20 mg total) by mouth daily. 10/19/20   Claretta Fraise, MD  senna (SENOKOT) 8.6 MG TABS tablet Take 1 tablet (8.6 mg total) by mouth 2 (two) times daily. 05/27/19   Roxan Hockey, MD  sertraline (ZOLOFT) 100 MG tablet Take 1 tablet (100 mg total) by mouth at bedtime. For depression 10/19/20   Claretta Fraise, MD  sevelamer carbonate (RENVELA) 800 MG tablet Take  1,600-3,200 mg by mouth 5 (five) times daily. Patient takes 4 tablets('3200mg'$ ) 3 times a day with meals and 2 tablets('1600mg'$ ) 2 times a day with snacks 10/02/16   [provider]    Physical Exam: Vitals:   11/23/2020 1000 11/18/2020 1100 11/30/2020 1208 11/14/2020 1344  BP: (!) 153/45 125/74 (!) 149/52 (!) 132/45  Pulse: (!) 57 (!) 58 (!) 57 (!) 54  Resp:  18 (!) 21 13  Temp:    97.7 F (36.5 C)  TempSrc:    Oral  SpO2:  96% 94% 96%  Weight:      Height:        Constitutional: NAD, calm, comfortable Vitals:   11/08/2020 1000 11/11/2020 1100 11/22/2020 1208 11/13/2020 1344  BP: (!) 153/45 125/74 (!) 149/52 (!) 132/45  Pulse: (!) 57 (!) 58 (!) 57 (!) 54  Resp:  18 (!) 21 13  Temp:    97.7 F (36.5 C)  TempSrc:    Oral  SpO2:  96% 94% 96%  Weight:      Height:       Eyes: lids and conjunctivae normal Neck: normal, supple Respiratory: clear to auscultation bilaterally. Normal respiratory effort. No accessory muscle use.  Cardiovascular: Regular rate and rhythm, no murmurs. Abdomen: no tenderness, no distention. Bowel sounds positive.  Musculoskeletal: Left lower extremity in knee brace Skin: no rashes, lesions, ulcers.  Psychiatric: Flat affect  Labs on Admission: I have personally reviewed following labs and imaging studies  CBC: Recent Labs  Lab 12/01/2020 1015  WBC 19.2*  NEUTROABS 16.1*  HGB 7.4*  HCT 25.0*  MCV 96.5  PLT [id number]   Basic Metabolic Panel: Recent Labs  Lab 12/02/2020 1015  NA 139  K 6.0*  CL 101  CO2 19*  GLUCOSE 219*  BUN 91*  CREATININE 12.96*  CALCIUM 8.1*   GFR: Estimated Creatinine Clearance: 4.4 mL/min (A) (by C-G formula based on SCr of 12.96 mg/dL (H)). Liver Function Tests: Recent Labs  Lab 11/11/2020 1015  AST 27  ALT 34  ALKPHOS 101  BILITOT 0.5  PROT 6.6  ALBUMIN 2.7*   No results for input(s): LIPASE, AMYLASE in the last 168 hours. No results for input(s): AMMONIA in the last 168 hours. Coagulation Profile: No results for  input(s): INR, PROTIME in the last  168 hours. Cardiac Enzymes: No results for input(s): CKTOTAL, CKMB, CKMBINDEX, TROPONINI in the last 168 hours. BNP (last 3 results) No results for input(s): PROBNP in the last 8760 hours. HbA1C: No results for input(s): HGBA1C in the last 72 hours. CBG: No results for input(s): GLUCAP in the last 168 hours. Lipid Profile: No results for input(s): CHOL, HDL, LDLCALC, TRIG, CHOLHDL, LDLDIRECT in the last 72 hours. Thyroid Function Tests: No results for input(s): TSH, T4TOTAL, FREET4, T3FREE, THYROIDAB in the last 72 hours. Anemia Panel: No results for input(s): VITAMINB12, FOLATE, FERRITIN, TIBC, IRON, RETICCTPCT in the last 72 hours. Urine analysis:    Component Value Date/Time   COLORURINE YELLOW 12/02/2018 0622   APPEARANCEUR Cloudy (A) 01/23/2019 1200   LABSPEC 1.013 12/02/2018 0622   PHURINE 7.0 12/02/2018 0622   GLUCOSEU Trace (A) 01/23/2019 1200   HGBUR SMALL (A) 12/02/2018 0622   BILIRUBINUR Negative 01/23/2019 1200   KETONESUR NEGATIVE 12/02/2018 0622   PROTEINUR 2+ (A) 01/23/2019 1200   PROTEINUR 100 (A) 12/02/2018 0622   NITRITE Negative 01/23/2019 1200   NITRITE NEGATIVE 12/02/2018 0622   LEUKOCYTESUR 3+ (A) 01/23/2019 1200   LEUKOCYTESUR LARGE (A) 12/02/2018 0622    Radiological Exams on Admission: CT Knee Left Wo Contrast  Result Date: 11/06/2020 CLINICAL DATA:  Knee instability EXAM: CT OF THE left KNEE WITHOUT CONTRAST TECHNIQUE: Multidetector CT imaging of the left knee was performed according to the standard protocol. Multiplanar CT image reconstructions were also generated. COMPARISON:  Radiograph same day FINDINGS: Bones/Joint/Cartilage There is a comminuted distal femur fracture with impaction and intra-articular extension through the intercondylar notch. There is mild posteromedial displacement by 4-6 mm. There is no evidence of proximal tibia, proximal fibular, or patellar fracture. There is a moderate-sized  lipohemarthrosis. Ligaments Suboptimally assessed by CT. Muscles and Tendons Generalized loss of muscle bulk with mild muscle atrophy. No intramuscular collection. Soft tissues Extensive soft tissue swelling along the distal femur and knee. Vascular calcifications. IMPRESSION: Comminuted, intra-articular distal femur fracture with impaction and mild posteromedial displacement. Moderate-sized lipohemarthrosis. Electronically Signed   By: Maurine Simmering M.D.   On: 11/21/2020 13:31   CT 3D RECON AT SCANNER  Result Date: 11/11/2020 CLINICAL DATA:  Knee instability EXAM: 3-DIMENSIONAL CT IMAGE RENDERING ON ACQUISITION WORKSTATION TECHNIQUE: 3-dimensional CT images were rendered by post-processing of the original CT data on an acquisition workstation. The 3-dimensional CT images were interpreted and findings were reported in the accompanying complete CT report for this study COMPARISON:  None. FINDINGS: 3D reconstructions of the left knee or performed. Again seen is a comminuted distal femur fracture with intra-articular extension through the intercondylar notch. There is mild posteromedial displacement and mild impaction. IMPRESSION: 3D reconstruction of the left knee demonstrating the comminuted distal femur fracture with intra-articular extension through the intercondylar notch Electronically Signed   By: Maurine Simmering M.D.   On: 11/09/2020 14:06   DG Knee Complete 4 Views Left  Result Date: 11/21/2020 CLINICAL DATA:  [place] yesterday. Pain and swelling. EXAM: LEFT KNEE - COMPLETE 4+ VIEW COMPARISON:  None. FINDINGS: Comminuted but minimally displaced fracture involving the distal femur. Intra-articular involvement with large hemarthrosis. The tibia and fibula are intact. Extensive age advanced vascular calcifications. IMPRESSION: Comminuted distal femur fracture with intra-articular involvement. Electronically Signed   By: Marijo Sanes M.D.   On: 11/07/2020 09:57    EKG: Independently reviewed. SR  57bpm.  Assessment/Plan Active Problems:   Femur fracture, left (HCC)    Left femur fracture status post fall -  EDP discussed with orthopedics with plan for operative intervention by 9/12 -Plan to transfer to [facility] for treatment this weekend after hemodialysis -Pain management -Hold Eliquis  Hyperkalemia -Given Lokelma in ED -Hemodialysis per nephrology today -Follow a.m. labs -Monitor on telemetry  ESRD on HD MWF with prior history of renal transplant -Hemodialysis scheduled for today per nephrology service  Chronic systolic and diastolic CHF -LVEF [id number] -Does not appear significantly volume overloaded  Essential hypertension -Continue home regimen  Dyslipidemia -Continue statin  Type 2 diabetes with nephropathy and gastroparesis -Continue SSI -Continue Reglan   DVT prophylaxis: Heparin Code Status: Full Family Communication: None at bedside Disposition Plan:Admit for treatment of fracture Consults called:Nephrology, Orthopedics Admission status: Inpatient, Tele  Atalia Litzinger D Juddson Cobern DO [facility]  If 7PM-7AM, please contact night-coverage [email or web address]  12/02/2020, 2:19 PM

## 2020-11-12 NOTE — ED Provider Notes (Signed)
[facility] EMERGENCY DEPARTMENT Provider Note   CSN: [id number] Arrival date & time: 11/14/2020  0827     History Chief Complaint  Patient presents with   [name] is a 65 y.o. female.  Patient with a history of renal failure and takes dialysis Monday Wednesday Friday.  She fell yesterday and is here because of left knee pain.  She did not get her dialysis today.  The history is provided by the patient and medical records. No language interpreter was used.  Fall This is a new problem. The current episode started 12 to 24 hours ago. The problem occurs rarely. The problem has not changed since onset.Pertinent negatives include no chest pain, no abdominal pain and no headaches. Nothing aggravates the symptoms. Nothing relieves the symptoms. She has tried nothing for the symptoms. The treatment provided no relief.      Past Medical History:  Diagnosis Date   Abnormal CXR (chest x-ray) 05/25/2019   Abnormal LFTs (liver function tests) 03/11/2018   Acute hypoxemic respiratory failure ([place]) 12/16/2018   Acute on chronic combined systolic and diastolic CHF (congestive heart failure) -EF 40 to 45 % 08/10/2019   Acute respiratory failure with hypoxia (HCC) 12/02/2018   Anemia    of chronic disease   Blood transfusion without reported diagnosis    Cardiovascular disease    nonobstructive   Carotid artery stenosis 2008   CHF (congestive heart failure) ([place])    Coronary artery disease    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   Last Assessment & Plan:  - Continuing home suppression therapy: Bactrim M-W-F, Prednisone 5 mg daily, Myfortic 360 mg BID and Prograf 1 mg BID - Appreciate Nephrology recs   Diabetes mellitus    Dyslipidemia    Edema of lower extremity    with hypo-albuminemia and profound protenuria   HCAP (healthcare-associated pneumonia)    Heart murmur    Hypertension    Lung collapse 12/15/2018   Mitral regurgitation    Nephrotic syndrome     Patent foramen ovale    Pulmonary edema 08/10/2019   Pulmonary hypertension, moderate to severe (HCC)    Pulmonary nodule    Renal insufficiency    Tricuspid regurgitation    Volume depletion, renal, due to output loss (renal deficit)     Patient Active Problem List   Diagnosis Date Noted   Femur fracture, left ([place]) 11/05/2020   Moderate episode of recurrent major depressive disorder ([place]) 06/29/2020   Hydronephrosis of failed kidney transplant 06/29/2020   No-show for appointment 06/14/2020   Vaginal yeast infection [id number]   Systolic and diastolic CHF, chronic ([place]) 01/07/2020   Hyperkalemia    Hydronephrosis    Type 2 diabetes mellitus ([place]) 03/11/2018   Valvular heart disease 03/11/2018   Chronic kidney disease on chronic dialysis ([place]) 11/13/2017   Leukocytosis 09/21/2017   Generalized weakness 09/21/2017   Hyperlipidemia associated with type 2 diabetes mellitus ([place]) 02/22/2017   ESRD (end stage renal disease) ([place]) 08/04/2016   Aortic valve sclerosis 06/08/2015   Anemia of chronic disease 07/03/2013   Essential hypertension 12/18/2010    Past Surgical History:  Procedure Laterality Date   A/V FISTULAGRAM N/A 04/05/2017   Procedure: A/V FISTULAGRAM - Left Arm;  Surgeon: Angelia Mould, MD;  Location: [place] [facility];  Service: Cardiovascular;  Laterality: N/A;   A/V FISTULAGRAM Left 09/18/2017   Procedure: A/V FISTULAGRAM;  Surgeon: Serafina Mitchell, MD;  Location: [place] [facility];  Service: Cardiovascular;  Laterality: Left;   A/V SHUNT INTERVENTION Left 04/05/2017   Procedure: A/V SHUNT INTERVENTION;  Surgeon: Angelia Mould, MD;  Location: [place] [facility];  Service: Cardiovascular;  Laterality: Left;   CARDIAC CATHETERIZATION  2008   IR REMOVAL TUN CV CATH W/O FL  11/02/2016   KIDNEY TRANSPLANT Right 02/23/2009   PERIPHERAL VASCULAR BALLOON ANGIOPLASTY Left 09/18/2017   Procedure: PERIPHERAL VASCULAR BALLOON ANGIOPLASTY;  Surgeon: Serafina Mitchell, MD;  Location: [place] [facility];  Service: Cardiovascular;  Laterality: Left;  Arm Fistula     OB History   No obstetric history on file.     Family History  Problem Relation Age of Onset   Kidney disease Mother    Diabetes Mother    Diabetes Father    Heart disease Father     Social History   Tobacco Use   Smoking status: Never   Smokeless tobacco: Never  Vaping Use   Vaping Use: Never used  Substance Use Topics   Alcohol use: No   Drug use: No    Home Medications Prior to Admission medications   Medication Sig Start Date End Date Taking? Authorizing Provider  acetaminophen (TYLENOL) 500 MG tablet Take 500 mg by mouth every 6 (six) hours as needed for moderate pain or headache.  04/06/16   [provider]  amiodarone (PACERONE) 100 MG tablet Take 100 mg by mouth daily. 07/09/20   [provider]  amiodarone (PACERONE) 400 MG tablet Take 1 tablet (400 mg total) by mouth daily. Patient not taking: No sig reported 07/08/20   Claretta Fraise, MD  amLODipine (NORVASC) 5 MG tablet Hold until follow-up with PCP or cardiology Patient not taking: No sig reported 06/26/20   [provider]  apixaban (ELIQUIS) 2.5 MG TABS tablet Take 1 tablet (2.5 mg total) by mouth 2 (two) times daily. 07/08/20   Claretta Fraise, MD  ARIPiprazole (ABILIFY) 5 MG tablet Take 1 tablet (5 mg total) by mouth at bedtime. 04/27/20   Claretta Fraise, MD  aspirin (ASPIR-LOW) 81 MG EC tablet Take 1 tablet (81 mg total) by mouth daily with breakfast. 05/27/19   Emokpae, Courage, MD  B Complex-C-Folic Acid (RENA-VITE RX) 1 MG TABS Take 1 tablet by mouth daily. 09/24/18   [provider]  BRILINTA 90 MG TABS tablet Take 90 mg by mouth 2 (two) times daily. 05/13/20   [provider]  cloNIDine (CATAPRES) 0.1 MG tablet Take 1 tablet (0.1 mg total) by mouth daily. 01/13/20   Evelina Dun A, FNP  clopidogrel (PLAVIX) 75 MG tablet Take 75 mg by mouth daily. 05/07/20   [provider]  diclofenac Sodium (VOLTAREN) 1 % GEL Apply 2 g topically 4 (four) times daily. 02/13/20   Ivy Lynn, NP  Dulaglutide (TRULICITY) 1.5 0000000 SOPN INJECT 1.'5MG'$   SUBCUTANEOUSLY ONCE A WEEK. 10/19/20   Claretta Fraise, MD  insulin degludec (TRESIBA FLEXTOUCH) 100 UNIT/ML FlexTouch Pen Inject 15 Units into the skin daily. 04/27/20   Claretta Fraise, MD  isosorbide mononitrate (IMDUR) 30 MG 24 hr tablet Hold until follow-up with PCP or cardiology  (previous dose: 30 daily) Patient not taking: No sig reported 06/26/20   [provider]  isosorbide mononitrate (IMDUR) 60 MG 24 hr tablet Take 1 tablet (60 mg total) by mouth daily. 01/13/20   Evelina Dun A, FNP  Lidocaine 4 % PTCH Place onto the skin. 07/16/20   [provider]  lisinopril (ZESTRIL) 10 MG tablet  Hold until follow-up with PCP  (usual dose 10 mg daily) Patient not taking: No sig reported 06/26/20   [provider]  metoCLOPramide (REGLAN) 5 MG tablet Take 1 tablet (5 mg total) by mouth 4 (four) times daily -  before meals and at bedtime. Patient taking differently: Take 5 mg by mouth 3 (three) times daily before meals. 12/24/19   Rolland Porter, MD  metoprolol succinate (TOPROL-XL) 25 MG 24 hr tablet Take 1 tablet (25 mg total) by mouth daily. 01/13/20   Evelina Dun A, FNP  mirtazapine (REMERON) 15 MG tablet Take 1 tablet (15 mg total) by mouth at bedtime. For sleep 10/19/20   Claretta Fraise, MD  nitroGLYCERIN (NITROSTAT) 0.4 MG SL tablet Place under the tongue. 05/18/20   [provider]  ondansetron (ZOFRAN-ODT) 4 MG disintegrating tablet Take 4 mg by mouth every 8 (eight) hours as needed. 07/06/20   [provider]  pantoprazole (PROTONIX) 40 MG tablet Take 1 tablet by mouth daily. 06/27/20   [provider]  predniSONE (DELTASONE) 5 MG tablet Take 1 tablet (5 mg total) by mouth daily with breakfast. 07/22/19   Barton Dubois, MD  PROGRAF 0.5 MG capsule Take 0.5 mg by mouth  daily.  10/24/16   [provider]  rosuvastatin (CRESTOR) 20 MG tablet Take 1 tablet (20 mg total) by mouth daily. 10/19/20   Claretta Fraise, MD  senna (SENOKOT) 8.6 MG TABS tablet Take 1 tablet (8.6 mg total) by mouth 2 (two) times daily. 05/27/19   Roxan Hockey, MD  sertraline (ZOLOFT) 100 MG tablet Take 1 tablet (100 mg total) by mouth at bedtime. For depression 10/19/20   Claretta Fraise, MD  sevelamer carbonate (RENVELA) 800 MG tablet Take 1,600-3,200 mg by mouth 5 (five) times daily. Patient takes 4 tablets('3200mg'$ ) 3 times a day with meals and 2 tablets('1600mg'$ ) 2 times a day with snacks 10/02/16   [provider]    Allergies    Other and Tape  Review of Systems   Review of Systems  Constitutional:  Negative for appetite change and fatigue.  HENT:  Negative for congestion, ear discharge and sinus pressure.   Eyes:  Negative for discharge.  Respiratory:  Negative for cough.   Cardiovascular:  Negative for chest pain.  Gastrointestinal:  Negative for abdominal pain and diarrhea.  Genitourinary:  Negative for frequency and hematuria.  Musculoskeletal:  Negative for back pain.       Tender left knee  Skin:  Negative for rash.  Neurological:  Negative for seizures and headaches.  Psychiatric/Behavioral:  Negative for hallucinations.    Physical Exam Updated Vital Signs BP (!) 132/45 (BP Location: Left Arm)   Pulse (!) 54   Temp 97.7 F (36.5 C) (Oral)   Resp 13   Ht '5\' 9"'$  (1.753 m)   Wt 65.8 kg   SpO2 96%   BMI 21.41 kg/m   Physical Exam Vitals and nursing note reviewed.  Constitutional:      Appearance: She is well-developed.  HENT:     Head: Normocephalic.     Nose: Nose normal.  Eyes:     General: No scleral icterus.    Conjunctiva/sclera: Conjunctivae normal.  Neck:     Thyroid: No thyromegaly.  Cardiovascular:     Rate and Rhythm: Normal rate and regular rhythm.     Heart sounds: No murmur heard.   No friction rub. No gallop.  Pulmonary:      Breath sounds: No stridor. No wheezing or rales.  Chest:     Chest wall: No tenderness.  Abdominal:     General: There is no distension.     Tenderness: There is no abdominal tenderness. There is no rebound.  Musculoskeletal:     Cervical back: Neck supple.     Comments: Tender swollen left knee  Lymphadenopathy:     Cervical: No cervical adenopathy.  Skin:    Findings: No erythema or rash.  Neurological:     Mental Status: She is alert and oriented to person, place, and time.     Motor: No abnormal muscle tone.     Coordination: Coordination normal.  Psychiatric:        Behavior: Behavior normal.    ED Results / Procedures / Treatments   Labs (all labs ordered are listed, but only abnormal results are displayed) Labs Reviewed  CBC WITH DIFFERENTIAL/PLATELET - Abnormal; Notable for the following components:      Result Value   WBC 19.2 (*)    RBC 2.59 (*)    Hemoglobin 7.4 (*)    HCT 25.0 (*)    MCHC 29.6 (*)    RDW 17.1 (*)    Neutro Abs 16.1 (*)    Monocytes Absolute 1.2 (*)    Eosinophils Absolute 0.9 (*)    Abs Immature Granulocytes 0.16 (*)    All other components within normal limits  COMPREHENSIVE METABOLIC PANEL - Abnormal; Notable for the following components:   Potassium 6.0 (*)    CO2 19 (*)    Glucose, Bld 219 (*)    BUN 91 (*)    Creatinine, Ser 12.96 (*)    Calcium 8.1 (*)    Albumin 2.7 (*)    GFR, Estimated 3 (*)    Anion gap 19 (*)    All other components within normal limits  RESP PANEL BY RT-PCR (FLU A&B, COVID) [id number]    EKG None  Radiology CT Knee Left Wo Contrast  Result Date: 11/21/2020 CLINICAL DATA:  Knee instability EXAM: CT OF THE left KNEE WITHOUT CONTRAST TECHNIQUE: Multidetector CT imaging of the left knee was performed according to the standard protocol. Multiplanar CT image reconstructions were also generated. COMPARISON:  Radiograph same day FINDINGS: Bones/Joint/Cartilage There is a comminuted distal femur fracture with  impaction and intra-articular extension through the intercondylar notch. There is mild posteromedial displacement by 4-6 mm. There is no evidence of proximal tibia, proximal fibular, or patellar fracture. There is a moderate-sized lipohemarthrosis. Ligaments Suboptimally assessed by CT. Muscles and Tendons Generalized loss of muscle bulk with mild muscle atrophy. No intramuscular collection. Soft tissues Extensive soft tissue swelling along the distal femur and knee. Vascular calcifications. IMPRESSION: Comminuted, intra-articular distal femur fracture with impaction and mild posteromedial displacement. Moderate-sized lipohemarthrosis. Electronically Signed   By: Maurine Simmering M.D.   On: 11/09/2020 13:31   CT 3D RECON AT SCANNER  Result Date: 11/14/2020 CLINICAL DATA:  Knee instability EXAM: 3-DIMENSIONAL CT IMAGE RENDERING ON ACQUISITION WORKSTATION TECHNIQUE: 3-dimensional CT images were rendered by post-processing of the original CT data on an acquisition workstation. The 3-dimensional CT images were interpreted and findings were reported in the accompanying complete CT report for this study COMPARISON:  None. FINDINGS: 3D reconstructions of the left knee or performed. Again seen is a comminuted distal femur fracture with intra-articular extension through the intercondylar notch. There is mild posteromedial displacement and mild impaction. IMPRESSION: 3D reconstruction of the left knee demonstrating the comminuted distal femur fracture with intra-articular extension through the intercondylar notch Electronically Signed  By: Maurine Simmering M.D.   On: 12/01/2020 14:06   DG Knee Complete 4 Views Left  Result Date: 11/11/2020 CLINICAL DATA:  [place] yesterday. Pain and swelling. EXAM: LEFT KNEE - COMPLETE 4+ VIEW COMPARISON:  None. FINDINGS: Comminuted but minimally displaced fracture involving the distal femur. Intra-articular involvement with large hemarthrosis. The tibia and fibula are intact. Extensive age  advanced vascular calcifications. IMPRESSION: Comminuted distal femur fracture with intra-articular involvement. Electronically Signed   By: Marijo Sanes M.D.   On: 11/11/2020 09:57    Procedures Procedures   Medications Ordered in ED Medications  Chlorhexidine Gluconate Cloth 2 % PADS 6 each (has no administration in time range)  ceFAZolin (ANCEF) IVPB 2g/100 mL premix (has no administration in time range)  HYDROmorphone (DILAUDID) injection 0.5 mg (has no administration in time range)  HYDROmorphone (DILAUDID) injection 0.5 mg (0.5 mg Intravenous Given 11/17/2020 1101)  ondansetron (ZOFRAN) injection 4 mg (4 mg Intravenous Given 11/07/2020 1101)  sodium zirconium cyclosilicate (LOKELMA) packet 10 g (10 g Oral Given 12/03/2020 1205)    ED Course  I have reviewed the triage vital signs and the nursing notes.  Pertinent labs & imaging results that were available during my care of the patient were reviewed by me and considered in my medical decision making (see chart for details).   CRITICAL CARE Performed by: Milton Ferguson Total critical care time: 45 minutes Critical care time was exclusive of separately billable procedures and treating other patients. Critical care was necessary to treat or prevent imminent or life-threatening deterioration. Critical care was time spent personally by me on the following activities: development of treatment plan with patient and/or surrogate as well as nursing, discussions with consultants, evaluation of patient's response to treatment, examination of patient, obtaining history from patient or surrogate, ordering and performing treatments and interventions, ordering and review of laboratory studies, ordering and review of radiographic studies, pulse oximetry and re-evaluation of patient's condition. Patient with a fracture to her distal femur in her left leg.  I have had Dr. Aline Brochure orthopedics take a look at her and he is arrange for the patient to have surgery done  on Monday by Dr. Judee Clara at [facility].  I spoke with nephrology and they will arrange for dialysis today.  She was given locamal  initially.  She will be admitted to medicine either here or [facility] today MDM Rules/Calculators/A&P                           Patient with a fractured distal femur along with hyperkalemia and renal failure Final Clinical Impression(s) / ED Diagnoses Final diagnoses:  Fall, initial encounter    Rx / DC Orders ED Discharge Orders     None        Milton Ferguson, MD 11/13/20 1240

## 2020-11-12 NOTE — Procedures (Signed)
   HEMODIALYSIS TREATMENT NOTE:   Unable to complete 4 hour treatment due to access failure.  AVF cannulation was problematic, requiring the assistance of another HD nurse.  Palpable spasms--max Qb 250.  After d/w Dr. Carolin Sicks, HD orders were modified to dialyze pt for the first hour on a low-K bath.  Unfortunately, the AVF completely clotted after one hour of therapy.  No palpable thrill or audible bruit.   Dr. Carolin Sicks was notified of above and he feels hyperkalemia has likely been corrected after 1 hour of HD with low-K dialysate.  Dr. Manuella Ghazi was notified via secure chat of EBL 300cc (as I was unable to return her blood through a thrombosed access) and pre-tx Hgb 7.4.  CBC and RFP have been ordered for tomorrow morning.  Pt was transferred to telemetry unit.  Bedside report was given to Reather Converse, primary RN, who was kind enough to accept the patient at shift-change.  Sonia Baller was notified of need to administer previously ordered Cefepime, which I was unable to give with dialysis.   [name], RN

## 2020-11-13 ENCOUNTER — Inpatient Hospital Stay (HOSPITAL_COMMUNITY): Payer: Medicare HMO

## 2020-11-13 DIAGNOSIS — T17908A Unspecified foreign body in respiratory tract, part unspecified causing other injury, initial encounter: Secondary | ICD-10-CM

## 2020-11-13 DIAGNOSIS — N186 End stage renal disease: Secondary | ICD-10-CM

## 2020-11-13 DIAGNOSIS — Z992 Dependence on renal dialysis: Secondary | ICD-10-CM

## 2020-11-13 LAB — GLUCOSE, CAPILLARY
Glucose-Capillary: 102 mg/dL — ABNORMAL HIGH (ref 70–99)
Glucose-Capillary: 122 mg/dL — ABNORMAL HIGH (ref 70–99)
Glucose-Capillary: 138 mg/dL — ABNORMAL HIGH (ref 70–99)
Glucose-Capillary: 141 mg/dL — ABNORMAL HIGH (ref 70–99)
Glucose-Capillary: 146 mg/dL — ABNORMAL HIGH (ref 70–99)
Glucose-Capillary: 152 mg/dL — ABNORMAL HIGH (ref 70–99)

## 2020-11-13 LAB — BASIC METABOLIC PANEL
Anion gap: 20 — ABNORMAL HIGH (ref 5–15)
BUN: 92 mg/dL — ABNORMAL HIGH (ref 8–23)
CO2: 18 mmol/L — ABNORMAL LOW (ref 22–32)
Calcium: 7.7 mg/dL — ABNORMAL LOW (ref 8.9–10.3)
Chloride: 102 mmol/L (ref 98–111)
Creatinine, Ser: 13.54 mg/dL — ABNORMAL HIGH (ref 0.44–1.00)
GFR, Estimated: 3 mL/min — ABNORMAL LOW (ref >60)
Glucose, Bld: 153 mg/dL — ABNORMAL HIGH (ref 70–99)
Potassium: 5.9 mmol/L — ABNORMAL HIGH (ref 3.5–5.1)
Sodium: 140 mmol/L (ref 135–145)

## 2020-11-13 LAB — BLOOD GAS, ARTERIAL
Acid-Base Excess: 0.6 mmol/L (ref 0.0–2.0)
Acid-base deficit: 5.5 mmol/L — ABNORMAL HIGH (ref 0.0–2.0)
Bicarbonate: 20.4 mmol/L (ref 20.0–28.0)
FIO2: 100
FIO2: 40
O2 Saturation: >100 %
O2 Saturation: 98.7 %
Patient temperature: 37
Patient temperature: 37
pCO2 arterial: 33.2 mmHg (ref 32.0–48.0)
pCO2 arterial: 28.4 mmHg — ABNORMAL LOW (ref 32.0–48.0)
pH, Arterial: 7.471 — ABNORMAL HIGH (ref 7.350–7.450)
pH, Arterial: 7.421 (ref 7.350–7.450)
pO2, Arterial: 404 mmHg — ABNORMAL HIGH (ref 83.0–108.0)
pO2, Arterial: 147 mmHg — ABNORMAL HIGH (ref 83.0–108.0)

## 2020-11-13 LAB — CBC
HCT: 22.3 % — ABNORMAL LOW (ref 36.0–46.0)
Hemoglobin: 6.4 g/dL — CL (ref 12.0–15.0)
MCH: 27.9 pg (ref 26.0–34.0)
MCHC: 28.7 g/dL — ABNORMAL LOW (ref 30.0–36.0)
MCV: 97.4 fL (ref 80.0–100.0)
Platelets: 184 10*3/uL (ref 150–400)
RBC: 2.29 MIL/uL — ABNORMAL LOW (ref 3.87–5.11)
RDW: 17 % — ABNORMAL HIGH (ref 11.5–15.5)
WBC: 16.6 10*3/uL — ABNORMAL HIGH (ref 4.0–10.5)
nRBC: 0.1 % (ref 0.0–0.2)

## 2020-11-13 LAB — PROCALCITONIN: Procalcitonin: 3.92 ng/mL

## 2020-11-13 LAB — PREPARE RBC (CROSSMATCH)

## 2020-11-13 LAB — MAGNESIUM: Magnesium: 2.1 mg/dL (ref 1.7–2.4)

## 2020-11-13 LAB — HEPATITIS B SURFACE ANTIGEN: Hepatitis B Surface Ag: NONREACTIVE

## 2020-11-13 MED ORDER — SODIUM CHLORIDE 0.9% IV SOLUTION: Freq: Once | INTRAVENOUS | Status: DC

## 2020-11-13 MED ORDER — LIDOCAINE HCL (PF) 1 % IJ SOLN: 1.0000 mL | INTRAMUSCULAR | Status: DC | PRN

## 2020-11-13 MED ORDER — HEPARIN SODIUM (PORCINE) 1000 UNIT/ML DIALYSIS
1000.0000 [IU] | INTRAMUSCULAR | Status: DC | PRN
  Filled 2020-11-13: qty 1

## 2020-11-13 MED ORDER — ALTEPLASE 2 MG IJ SOLR: 2.0000 mg | Freq: Once | INTRAMUSCULAR | Status: DC | PRN

## 2020-11-13 MED ORDER — PANTOPRAZOLE SODIUM 40 MG IV SOLR
40.0000 mg | INTRAVENOUS | Status: DC
  Administered 2020-11-13 – 2020-11-19 (×7): 40 mg via INTRAVENOUS
  Filled 2020-11-13 (×7): qty 40

## 2020-11-13 MED ORDER — INSULIN ASPART 100 UNIT/ML IJ SOLN
0.0000 [IU] | INTRAMUSCULAR | Status: DC
Start: 2020-11-13 — End: 2020-11-20
  Administered 2020-11-14: 1 [IU] via SUBCUTANEOUS
  Administered 2020-11-14: 2 [IU] via SUBCUTANEOUS
  Administered 2020-11-14: 1 [IU] via SUBCUTANEOUS
  Administered 2020-11-14: 2 [IU] via SUBCUTANEOUS
  Administered 2020-11-14: 1 [IU] via SUBCUTANEOUS
  Administered 2020-11-15: 2 [IU] via SUBCUTANEOUS
  Administered 2020-11-15: 1 [IU] via SUBCUTANEOUS
  Administered 2020-11-15: 2 [IU] via SUBCUTANEOUS
  Administered 2020-11-15 (×2): 1 [IU] via SUBCUTANEOUS
  Administered 2020-11-16: 3 [IU] via SUBCUTANEOUS
  Administered 2020-11-16 (×2): 2 [IU] via SUBCUTANEOUS
  Administered 2020-11-16: 1 [IU] via SUBCUTANEOUS
  Administered 2020-11-16 (×2): 2 [IU] via SUBCUTANEOUS
  Administered 2020-11-17 (×3): 1 [IU] via SUBCUTANEOUS
  Administered 2020-11-17: 2 [IU] via SUBCUTANEOUS
  Administered 2020-11-17 – 2020-11-18 (×6): 1 [IU] via SUBCUTANEOUS
  Administered 2020-11-18: 2 [IU] via SUBCUTANEOUS
  Administered 2020-11-19: 1 [IU] via SUBCUTANEOUS

## 2020-11-13 MED ORDER — DARBEPOETIN ALFA 40 MCG/0.4ML IJ SOSY
40.0000 ug | PREFILLED_SYRINGE | Freq: Once | INTRAMUSCULAR | Status: DC
  Filled 2020-11-13: qty 0.4

## 2020-11-13 MED ORDER — CHLORHEXIDINE GLUCONATE CLOTH 2 % EX PADS
6.0000 | MEDICATED_PAD | Freq: Every day | CUTANEOUS | Status: DC
  Administered 2020-11-14 – 2020-11-19 (×6): 6 via TOPICAL

## 2020-11-13 MED ORDER — PROPOFOL 1000 MG/100ML IV EMUL
0.0000 ug/kg/min | INTRAVENOUS | Status: DC
  Administered 2020-11-13: 5 ug/kg/min via INTRAVENOUS
  Filled 2020-11-13: qty 100

## 2020-11-13 MED ORDER — PIPERACILLIN-TAZOBACTAM IN DEX 2-0.25 GM/50ML IV SOLN
2.2500 g | Freq: Three times a day (TID) | INTRAVENOUS | Status: DC
  Filled 2020-11-13 (×7): qty 50

## 2020-11-13 MED ORDER — SODIUM CHLORIDE 0.9 % IV SOLN: 100.0000 mL | INTRAVENOUS | Status: DC | PRN

## 2020-11-13 MED ORDER — SODIUM CHLORIDE 0.9 % IV SOLN
2.2500 g | Freq: Three times a day (TID) | INTRAVENOUS | Status: DC
  Administered 2020-11-13 (×2): 2.25 g via INTRAVENOUS
  Filled 2020-11-13 (×9): qty 10

## 2020-11-13 MED ORDER — CALCITRIOL 0.25 MCG PO CAPS: 0.7500 ug | ORAL_CAPSULE | ORAL | Status: DC

## 2020-11-13 MED ORDER — MIDAZOLAM HCL (PF) 10 MG/2ML IJ SOLN
INTRAMUSCULAR | Status: AC
  Administered 2020-11-13: 2 mg
  Filled 2020-11-13: qty 2

## 2020-11-13 MED ORDER — DOCUSATE SODIUM 50 MG/5ML PO LIQD
100.0000 mg | Freq: Two times a day (BID) | ORAL | Status: DC
  Administered 2020-11-13 – 2020-11-18 (×9): 100 mg
  Filled 2020-11-13 (×9): qty 10

## 2020-11-13 MED ORDER — POLYETHYLENE GLYCOL 3350 17 G PO PACK
17.0000 g | PACK | Freq: Every day | ORAL | Status: DC
  Administered 2020-11-16 – 2020-11-17 (×2): 17 g
  Filled 2020-11-13 (×2): qty 1

## 2020-11-13 MED ORDER — LIDOCAINE HCL (PF) 1 % IJ SOLN
INTRAMUSCULAR | Status: AC
  Administered 2020-11-13: 5 mL
  Filled 2020-11-13: qty 5

## 2020-11-13 NOTE — Consult Note (Addendum)
[facility] Nephrology Consult Note: Reason for Consult: To manage dialysis and dialysis related needs Referring Physician: Dr Manuella Ghazi, Pratik  HPI:  [name] is an 65 y.o. female with history of hypertension, HLD, type II DM, CHF, ESRD on HD MWF who presented to the ER with a left knee pain after a fall, seen as a consult for the management of ESRD.  She goes to [facility].  She was found to have left knee comminuted distal femur fracture with intra-articular involvement.  The Ortho team is planning for operative intervention on 9/12 at [facility].  In ER she was found to have potassium of 6 with BUN 91.  We attempted dialysis on 9/9 with her AV fistula but it was clotted after 1 hour.  It was very hard to stick requiring multiple attempt.  Unable to return the blood because of clotted fistula.  This morning seen by general surgeon and placed right groin temporary HD catheter.  She is now undergoing dialysis without any issues.  Patient denies nausea, vomiting, chest pain, shortness of breath.  [facility]-  orders are as follows  MWF-  4 hours AVF-  16 gauge- 300 BFR 2/2.5 bath/300 rev/EDW 68.5-  no heparin Calcitriol 0.75 TIW, epo 12,000 TIW and venofer 50 weekly   Past Medical History:  Diagnosis Date   Abnormal CXR (chest x-ray) 05/25/2019   Abnormal LFTs (liver function tests) 03/11/2018   Acute hypoxemic respiratory failure ([place]) 12/16/2018   Acute on chronic combined systolic and diastolic CHF (congestive heart failure) -EF 40 to 45 % 08/10/2019   Acute respiratory failure with hypoxia ([place]) 12/02/2018   Anemia    of chronic disease   Blood transfusion without reported diagnosis    Cardiovascular disease    nonobstructive   Carotid artery stenosis 2008   CHF (congestive heart failure) ([place])    Coronary artery disease    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   Last Assessment & Plan:  - Continuing home suppression therapy: Bactrim M-W-F,  Prednisone 5 mg daily, Myfortic 360 mg BID and Prograf 1 mg BID - Appreciate Nephrology recs   Diabetes mellitus    Dyslipidemia    Edema of lower extremity    with hypo-albuminemia and profound protenuria   HCAP (healthcare-associated pneumonia)    Heart murmur    Hypertension    Lung collapse 12/15/2018   Mitral regurgitation    Nephrotic syndrome    Patent foramen ovale    Pulmonary edema 08/10/2019   Pulmonary hypertension, moderate to severe (HCC)    Pulmonary nodule    Renal insufficiency    Tricuspid regurgitation    Volume depletion, renal, due to output loss (renal deficit)     Past Surgical History:  Procedure Laterality Date   A/V FISTULAGRAM N/A 04/05/2017   Procedure: A/V FISTULAGRAM - Left Arm;  Surgeon: Angelia Mould, MD;  Location: [place] [facility];  Service: Cardiovascular;  Laterality: N/A;   A/V FISTULAGRAM Left 09/18/2017   Procedure: A/V FISTULAGRAM;  Surgeon: Serafina Mitchell, MD;  Location: [place] [facility];  Service: Cardiovascular;  Laterality: Left;   A/V SHUNT INTERVENTION Left 04/05/2017   Procedure: A/V SHUNT INTERVENTION;  Surgeon: Angelia Mould, MD;  Location: [place] [facility];  Service: Cardiovascular;  Laterality: Left;   CARDIAC CATHETERIZATION  2008   IR REMOVAL TUN CV CATH W/O FL  11/02/2016   KIDNEY TRANSPLANT Right 02/23/2009   PERIPHERAL VASCULAR BALLOON ANGIOPLASTY Left 09/18/2017  Procedure: PERIPHERAL VASCULAR BALLOON ANGIOPLASTY;  Surgeon: Serafina Mitchell, MD;  Location: [place] [facility];  Service: Cardiovascular;  Laterality: Left;  Arm Fistula    Family History  Problem Relation Age of Onset   Kidney disease Mother    Diabetes Mother    Diabetes Father    Heart disease Father     Social History:  reports that she has never smoked. She has never used smokeless tobacco. She reports that she does not drink alcohol and does not use drugs.  Allergies:  Allergies  Allergen Reactions   Other Other (See  Comments)    Tape Bruises and tears skin, Paper tape tolerated   Tape Other (See Comments)    Bruises and tears skin Paper tape tolerated Bruises and tears skin Paper tape tolerated Bruises and tears skin Paper tape tolerated    Medications: I have reviewed the patient's current medications.   Results for orders placed or performed during the hospital encounter of 11/26/2020 (from the past 48 hour(s))  CBC with Differential/Platelet     Status: Abnormal   Collection Time: 11/25/2020 10:15 AM  Result Value Ref Range   WBC 19.2 (H) 4.0 - 10.5 K/uL   RBC 2.59 (L) 3.87 - 5.11 MIL/uL   Hemoglobin 7.4 (L) 12.0 - 15.0 g/dL   HCT 25.0 (L) 36.0 - 46.0 %   MCV 96.5 80.0 - 100.0 fL   MCH 28.6 26.0 - 34.0 pg   MCHC 29.6 (L) 30.0 - 36.0 g/dL   RDW 17.1 (H) 11.5 - 15.5 %   Platelets 227 150 - 400 K/uL   nRBC 0.1 0.0 - 0.2 %   Neutrophils Relative % 83 %   Neutro Abs 16.1 (H) 1.7 - 7.7 K/uL   Lymphocytes Relative 4 %   Lymphs Abs 0.8 0.7 - 4.0 K/uL   Monocytes Relative 6 %   Monocytes Absolute 1.2 (H) 0.1 - 1.0 K/uL   Eosinophils Relative 5 %   Eosinophils Absolute 0.9 (H) 0.0 - 0.5 K/uL   Basophils Relative 1 %   Basophils Absolute 0.1 0.0 - 0.1 K/uL   Immature Granulocytes 1 %   Abs Immature Granulocytes 0.16 (H) 0.00 - 0.07 K/uL    Comment: Performed at [facility], [address], [place], [place] [address]  Comprehensive metabolic panel     Status: Abnormal   Collection Time: 11/05/2020 10:15 AM  Result Value Ref Range   Sodium 139 135 - 145 mmol/L   Potassium 6.0 (H) 3.5 - 5.1 mmol/L   Chloride 101 98 - 111 mmol/L   CO2 19 (L) 22 - 32 mmol/L   Glucose, Bld 219 (H) 70 - 99 mg/dL    Comment: Glucose reference range applies only to samples taken after fasting for at least 8 hours.   BUN 91 (H) 8 - 23 mg/dL   Creatinine, Ser 12.96 (H) 0.44 - 1.00 mg/dL   Calcium 8.1 (L) 8.9 - 10.3 mg/dL   Total Protein 6.6 6.5 - 8.1 g/dL   Albumin 2.7 (L) 3.5 - 5.0 g/dL   AST 27 15 - 41 U/L   ALT  34 0 - 44 U/L   Alkaline Phosphatase 101 38 - 126 U/L   Total Bilirubin 0.5 0.3 - 1.2 mg/dL   GFR, Estimated 3 (L) >60 mL/min    Comment: (NOTE) Calculated using the CKD-EPI Creatinine Equation (2021)    Anion gap 19 (H) 5 - 15    Comment: Performed at [facility], [address] [place], [place] [address]  HIV Antibody (routine testing w rflx)     Status: None   Collection Time: 11/22/2020 10:15 AM  Result Value Ref Range   HIV Screen 4th Generation wRfx Non Reactive Non Reactive    Comment: Performed at [facility], [address] [place], [place] [address]  Hemoglobin A1c     Status: Abnormal   Collection Time: 12/01/2020 10:15 AM  Result Value Ref Range   Hgb A1c MFr Bld 6.7 (H) 4.8 - 5.6 %    Comment: (NOTE) Pre diabetes:          5.7%-6.4%  Diabetes:              >6.4%  Glycemic control for   <7.0% adults with diabetes    Mean Plasma Glucose 145.59 mg/dL    Comment: Performed at [facility] [address] [place], [place] [address]  Resp Panel by RT-PCR (Flu A&B, Covid) Nasopharyngeal Swab     Status: None   Collection Time: 11/27/2020 10:26 AM   Specimen: Nasopharyngeal Swab; Nasopharyngeal(NP) swabs in vial transport medium  Result Value Ref Range   SARS Coronavirus 2 by RT PCR NEGATIVE NEGATIVE    Comment: (NOTE) SARS-CoV-2 target nucleic acids are NOT DETECTED.  The SARS-CoV-2 RNA is generally detectable in upper respiratory specimens during the acute phase of infection. The lowest concentration of SARS-CoV-2 viral copies this assay can detect is 138 copies/mL. A negative result does not preclude SARS-Cov-2 infection and should not be used as the sole basis for treatment or other patient management decisions. A negative result may occur with  improper specimen collection/handling, submission of specimen other than nasopharyngeal swab, presence of viral mutation(s) within the areas targeted by this assay, and inadequate number of viral copies(<138  copies/mL). A negative result must be combined with clinical observations, patient history, and epidemiological information. The expected result is Negative.  Fact Sheet for Patients:  EntrepreneurPulse.com.au  Fact Sheet for Healthcare Providers:  IncredibleEmployment.be  This test is no t yet approved or cleared by the [place] FDA and  has been authorized for detection and/or diagnosis of SARS-CoV-2 by FDA under an Emergency Use Authorization (EUA). This EUA will remain  in effect (meaning this test can be used) for the duration of the COVID-19 declaration under Section 564(b)(1) of the Act, 21 U.S.C.section 360bbb-3(b)(1), unless the authorization is terminated  or revoked sooner.       Influenza A by PCR NEGATIVE NEGATIVE   Influenza B by PCR NEGATIVE NEGATIVE    Comment: (NOTE) The Xpert Xpress SARS-CoV-2/FLU/RSV plus assay is intended as an aid in the diagnosis of influenza from Nasopharyngeal swab specimens and should not be used as a sole basis for treatment. Nasal washings and aspirates are unacceptable for Xpert Xpress SARS-CoV-2/FLU/RSV testing.  Fact Sheet for Patients: EntrepreneurPulse.com.au  Fact Sheet for Healthcare Providers: IncredibleEmployment.be  This test is not yet approved or cleared by the [place] FDA and has been authorized for detection and/or diagnosis of SARS-CoV-2 by FDA under an Emergency Use Authorization (EUA). This EUA will remain in effect (meaning this test can be used) for the duration of the COVID-19 declaration under Section 564(b)(1) of the Act, 21 U.S.C. section 360bbb-3(b)(1), unless the authorization is terminated or revoked.  Performed at [facility], [address], [place], [place] [address]   Glucose, capillary     Status: Abnormal   Collection Time: 11/22/2020  8:50 PM  Result Value Ref Range  Glucose-Capillary 197 (H) 70 - 99 mg/dL     Comment: Glucose reference range applies only to samples taken after fasting for at least 8 hours.  Magnesium     Status: None   Collection Time: 11/13/20  5:48 AM  Result Value Ref Range   Magnesium 2.1 1.7 - 2.4 mg/dL    Comment: Performed at [facility], [address] [place], [place] [id number]  Basic metabolic panel     Status: Abnormal   Collection Time: 11/13/20  5:48 AM  Result Value Ref Range   Sodium 140 135 - 145 mmol/L   Potassium 5.9 (H) 3.5 - 5.1 mmol/L   Chloride 102 98 - 111 mmol/L   CO2 18 (L) 22 - 32 mmol/L   Glucose, Bld 153 (H) 70 - 99 mg/dL    Comment: Glucose reference range applies only to samples taken after fasting for at least 8 hours.   BUN 92 (H) 8 - 23 mg/dL   Creatinine, Ser 13.54 (H) 0.44 - 1.00 mg/dL   Calcium 7.7 (L) 8.9 - 10.3 mg/dL   GFR, Estimated 3 (L) >60 mL/min    Comment: (NOTE) Calculated using the CKD-EPI Creatinine Equation (2021)    Anion gap 20 (H) 5 - 15    Comment: Performed at [facility], [address], [place], [place] [address]  CBC     Status: Abnormal   Collection Time: 11/13/20  5:48 AM  Result Value Ref Range   WBC 16.6 (H) 4.0 - 10.5 K/uL   RBC 2.29 (L) 3.87 - 5.11 MIL/uL   Hemoglobin 6.4 (LL) 12.0 - 15.0 g/dL    Comment: This critical result has verified and been called to [place] by Joaquin Courts on 09 10 2022 at 0737, and has been read back.    HCT 22.3 (L) 36.0 - 46.0 %   MCV 97.4 80.0 - 100.0 fL   MCH 27.9 26.0 - 34.0 pg   MCHC 28.7 (L) 30.0 - 36.0 g/dL   RDW 17.0 (H) 11.5 - 15.5 %   Platelets 184 150 - 400 K/uL   nRBC 0.1 0.0 - 0.2 %    Comment: Performed at [facility], [address], [place], [place] [address]  Glucose, capillary     Status: Abnormal   Collection Time: 11/13/20  7:50 AM  Result Value Ref Range   Glucose-Capillary 141 (H) 70 - 99 mg/dL    Comment: Glucose reference range applies only to samples taken after fasting for at least 8 hours.  Prepare RBC (crossmatch)     Status:  None   Collection Time: 11/13/20  8:44 AM  Result Value Ref Range   Order Confirmation      ORDER PROCESSED BY BLOOD BANK Performed at [facility], [address] [place], [place] [address]   Type and screen [facility]     Status: None (Preliminary result)   Collection Time: 11/13/20  8:44 AM  Result Value Ref Range   ABO/RH(D) A NEG    Antibody Screen NEG    Sample Expiration 11/16/2020,2359    Unit Number [id number]    Blood Component Type RED CELLS,LR    Unit division 00    Status of Unit ALLOCATED    Transfusion Status OK TO TRANSFUSE    Crossmatch Result      Compatible Performed at [facility], [address] [place], [place] [address]     CT Knee Left Wo Contrast  Result Date: 11/25/2020 CLINICAL DATA:  Knee instability EXAM: CT OF THE left KNEE WITHOUT CONTRAST TECHNIQUE: Multidetector CT imaging of the left knee was performed according to the standard protocol. Multiplanar CT image reconstructions were also generated. COMPARISON:  Radiograph same day FINDINGS: Bones/Joint/Cartilage There is a comminuted distal femur fracture with impaction and intra-articular extension through the intercondylar notch. There is mild posteromedial displacement by 4-6 mm. There is no evidence of proximal tibia, proximal fibular, or patellar fracture. There is a moderate-sized lipohemarthrosis. Ligaments Suboptimally assessed by CT. Muscles and Tendons Generalized loss of muscle bulk with mild muscle atrophy. No intramuscular collection. Soft tissues Extensive soft tissue swelling along the distal femur and knee. Vascular calcifications. IMPRESSION: Comminuted, intra-articular distal femur fracture with impaction and mild posteromedial displacement. Moderate-sized lipohemarthrosis. Electronically Signed   By: Maurine Simmering M.D.   On: 11/11/2020 13:31   CT 3D RECON AT SCANNER  Result Date: 11/27/2020 CLINICAL DATA:  Knee instability EXAM: 3-DIMENSIONAL CT IMAGE RENDERING ON ACQUISITION  WORKSTATION TECHNIQUE: 3-dimensional CT images were rendered by post-processing of the original CT data on an acquisition workstation. The 3-dimensional CT images were interpreted and findings were reported in the accompanying complete CT report for this study COMPARISON:  None. FINDINGS: 3D reconstructions of the left knee or performed. Again seen is a comminuted distal femur fracture with intra-articular extension through the intercondylar notch. There is mild posteromedial displacement and mild impaction. IMPRESSION: 3D reconstruction of the left knee demonstrating the comminuted distal femur fracture with intra-articular extension through the intercondylar notch Electronically Signed   By: Maurine Simmering M.D.   On: 12/02/2020 14:06   DG Knee Complete 4 Views Left  Result Date: 11/10/2020 CLINICAL DATA:  [place] yesterday. Pain and swelling. EXAM: LEFT KNEE - COMPLETE 4+ VIEW COMPARISON:  None. FINDINGS: Comminuted but minimally displaced fracture involving the distal femur. Intra-articular involvement with large hemarthrosis. The tibia and fibula are intact. Extensive age advanced vascular calcifications. IMPRESSION: Comminuted distal femur fracture with intra-articular involvement. Electronically Signed   By: Marijo Sanes M.D.   On: 11/21/2020 09:57    ROS: As per H&P.  Rest systems reviewed and are negative. Blood pressure (!) 132/39, pulse 67, temperature 98.5 F (36.9 C), temperature source Oral, resp. rate 19, height '5\' 9"'$  (1.753 m), weight 67.9 kg, SpO2 99 %. Gen: NAD, comfortable Respiratory: Clear bilateral, no wheezing or crackle Cardiovascular: Regular rate rhythm S1-S2 normal, no rubs GI: Abdomen soft, nontender, nondistended Extremities, no cyanosis or clubbing, no edema Skin: No rash or ulcer Neurology: Alert, awake, following commands, oriented Dialysis Access: Right groin catheter site clean.  Left upper extremity AV fistula has no bruit or thrill.  Assessment/Plan:  #Comminuted  distal left femur fracture after the fall: Seen by orthopedics and plan for operative intervention on 9/12 at [facility].  She is awaiting transfer from [facility].  # ESRD: MWF at [facility]: Unable to receive dialysis yesterday because of clotted AV fistula.  She is currently undergoing HD via right groin catheter placed by surgeon.  The catheter still has some issues.  Dialyzing with low potassium bath.  Once he goes to [facility], she will need vascular surgeon consult for fistula declotting and after placement of tunneled catheter.  #Hyperkalemia: HD with low potassium bath.  Monitor lab.  # Hypertension: Intradialytic hypotension.  Lowering temperature to 36 degrees and ordering albumin.  UF goal decreased.  Discussed with HD nurse.  # Anemia of ESRD: Drop in hemoglobin today probably due to blood loss after not  able to return concomitant with chronic illness.  I will check iron studies, resume ESA.  Plan to transfuse a unit of blood.  Discussed with the primary team.  # Metabolic Bone Disease: Monitor calcium phosphorus level.  Resume calcitriol, Renvela.  Thank you for the consult.  We will continue to follow.  Discussed with the primary team, dialysis nurse.  Essense Bousquet Tanna Furry 11/13/2020, 11:22 AM

## 2020-11-13 NOTE — Progress Notes (Signed)
Please see respiratory note.  Called to rapid response alarm.  Patient with impending respiratory failure.  Versed 2 mg used for intubation sedation.  Patient intubated with a 7-1/2 ET tube to the 23 cm Jerris Fleer.  Chest x-ray reveals adequate positioning of ET tube.  Patient to be transferred to [facility] critical care.

## 2020-11-13 NOTE — Procedures (Signed)
   HEMODIALYSIS TREATMENT NOTE:   Still hyperkalemic; needs HD again today.  AVF without thrill / bruit.  Appreciate Dr. Arnoldo Morale placing right fem non-tunneled catheter.  Pre-HD pt was A/O x 4, 101/50, pulse 70s, 92% on 2L via [place].  HD was initiated at max Qb 250.  Planned for 1K bath for the first 2 hours given lower flow.  Goal set for 1 liter over 4 hours given soft pressures.  Hypotensive episode 76/49 15 minutes into treatment with loss of consciousness.  After placing pt in t'berg, stopping UF, and bolusing with NS, pt was alert/responsive but requiring more O2--NRB FM @ 13L.  Likely aspiration given gurgling cough.  Caro Laroche, RT recommends CXR / prophylactic abx - ordered by Dr. Manuella Ghazi.  HD continued but pt's mental status began declining.  She would respond to loud voice and tactile stimuli but was lethargic and used only 1 word responses.  RR and spO2 were stable, but her respirations were shallow.  Rapid response team arrived promptly and the decision was made to intubate pt.  ET placed by Dr. Arnoldo Morale.  Pt remained hemodynamically stable on ventilator for the remainder of her treatment.  Tx time was extended for administration of one unit pRBC.  All blood was returned.   Total run time:  4 hours 20 minutes Blood volume processed:  56.8 L Net UF: +1.1 L   [name], RN

## 2020-11-13 NOTE — Progress Notes (Signed)
Ortho Trauma Note  Spoke with Dr. Aline Brochure yesterday regarding this patient. She will require ORIF of her distal femur. I will tentatively plan for Monday PM, pending OR availability. Please make NPO after midnight Sunday. Will consult on patient Monday AM if she is transferred to [facility] by then.  Shona Needles, MD [facility] [phone] (office) [email or web address]

## 2020-11-13 NOTE — Progress Notes (Signed)
Patient transferred to ICU/SD unit room 6 for temporary HD cath placement. Report given to Loni Muse, RN receiving nurse. Dr Arnoldo Morale aware of need for temp HD cath placement, stated he spoke with [name], RN regarding plans for HD treatment here today. Patient will receive HD treatment and 1 unit PRBCs as ordered during HD. Awaiting transfer to [facility] once bed available, bed placement aware of plan. Patient requested nursing notify her husband Altagracia Felgar that she had been moved to SD unit for temp HD cath placement. Spoke with him to notify. Verbalized understanding, no questions.

## 2020-11-13 NOTE — Progress Notes (Signed)
Responded to rapid response called for patient while in HD suite. Dr Arnoldo Morale present and planned to intubate patient, requested versed for intubation. Versed retrieved from ICU pyxis, 2 mg IV x 1 given pre-intubation by Loni Muse, RN. Remainder wasted via pyxis. Patient successfully intubated with dr Arnoldo Morale, dr Manuella Ghazi and RT present. Patient will remain in ICU until bed available at [facility] instead of returning to Dept 300 room 301 after HD treatment.

## 2020-11-13 NOTE — Progress Notes (Signed)
RR called due to dialysis patient being lethargic and Passing out. Patient was intubated by Dr Arnoldo Morale. 7.5 ETT placed at 23 _0  with Mac 4 blade with direct visualization. Chest rise noted, BBS noted, color change on the ETCO2 detector, and xray obtained and placement of tube confirmed by MD at bedside. There were no complications noted at this time. RN informed to call RT if needed.

## 2020-11-13 NOTE — Significant Event (Signed)
Patient had been brought to ICU for dialysis catheter placement. After placement [name] (dialysis nurse) came and transported patient to her unit. After about little over an hour a rapid response was called and patient had altered mental status and was not breathing adequately. Dr. Arnoldo Morale and Dr. Manuella Ghazi were in room and Dr. Arnoldo Morale stated he would intubate patient if Dr. Manuella Ghazi thought that would be the best course for [name]. Intubation was performed with 2 mg of Versed given for sedation and patient tolerated well. Orders were given for propofol for sedation but at intubation she did not need it. About 1450 patient became more awake and propofol was started. As of this note patient is getting blood and is still completing dialysis treatment.

## 2020-11-13 NOTE — Progress Notes (Signed)
Pharmacy Antibiotic Note  [name] is a 65 y.o. female admitted on 11/13/2020 with pneumonia.  Pharmacy has been consulted for Zosyn dosing.  Plan: Zosyn 2.25gm IV q8hrs (dialysis dosing) Monitor progress, cxs  Height: '5\' 9"'$  (175.3 cm) Weight: 67.9 kg (149 lb 11.1 oz) IBW/kg (Calculated) : 66.2  Temp (24hrs), Avg:98.2 F (36.8 C), Min:97.7 F (36.5 C), Max:98.7 F (37.1 C)  Recent Labs  Lab 11/10/2020 1015 11/13/20 0548  WBC 19.2* 16.6*  CREATININE 12.96* 13.54*    Estimated Creatinine Clearance: 4.3 mL/min (A) (by C-G formula based on SCr of 13.54 mg/dL (H)).    Allergies  Allergen Reactions   Other Other (See Comments)    Tape Bruises and tears skin, Paper tape tolerated   Tape Other (See Comments)    Bruises and tears skin Paper tape tolerated Bruises and tears skin Paper tape tolerated Bruises and tears skin Paper tape tolerated    Antimicrobials this admission:  Zosyn 9/10 >>     Dose adjustments this admission:   Microbiology results:  BCx: pending  UCx: pending   Sputum: pending   MRSA PCR: pending  Thank you for allowing pharmacy to be a part of this patient's care.  Hart Robinsons A 11/13/2020 12:39 PM

## 2020-11-13 NOTE — Consult Note (Signed)
Patient:  [name]  DOB:  07/15/1955  MRN:  [id number]   Preop Diagnosis:  Acute on chronic renal failure   Postop Diagnosis:  Same  Procedure:  Dialysis catheter insertion   Surgeon:  Aviva Signs, MD  Anes:  Local  Indications:  [phone] wf with hyperkalemia who needs dialysis access due to failure of her AVF.  The risks and benefits of the procedure were fully explained to the patient, who gives informed consent.  Procedure note:  The procedure was done in ICU #6.  Time out performed.  Full sterile technique was used.  1% xylocaine used for local anesthesia.  Right groin prepped with chloroprep.  The right femoral vein was accessed using the Seldinger technique without difficulty.  A guidewire was then passed.  An introducer was placed over the guidewire.  The triple-lumen dialysis catheter was then placed and the guidewire removed.  Good backflow of venous blood was noted on aspiration of all 3 ports.  All 3 ports were flushed with saline.  They will be flushed with heparin once it is available.  It was secured in place using 3-0 silk sutures.  Dry sterile dressing was applied.  Patient tolerated the procedure well.  Complications: None  EBL: Minimal  Specimen: None

## 2020-11-13 NOTE — Progress Notes (Addendum)
PROGRESS NOTE    [name]  [id number] DOB: Apr 26, 1955 DOA: 11/08/2020 PCP: Claretta Fraise, MD   Brief Narrative:   [name] is a 65 y.o. female with medical history significant for chronic systolic heart failure, type 2 diabetes with nephropathy, end-stage renal disease on HD MWF, hypertension, hyperlipidemia, and history of renal transplant who presented to the ED for left knee pain after sustaining a fall on the day prior to admission.  She was noted to have a comminuted distal left femur fracture with plans for operative intervention on 9/12 at [facility]  Unfortunately, she was not able to attend her dialysis session on the day of admission and dialysis was attempted, but AV fistula was noted to be clotted.  She had 300 cc of blood that could not be returned.  She continued to have hyperkalemia and anemia in the a.m. which mandated 1 unit PRBC transfusion as well as urgent hemodialysis.  General surgery had placed temporary femoral dialysis catheter, but during hemodialysis she became transiently hypotensive and unresponsive.  She appears to have potentially aspirated and is now requiring more oxygen supplementation and has been intubated.  Plans are for transfer to PCCM at [facility] once bed is available for further management and operative intervention hopefully by 9/12.  Assessment & Plan:   Active Problems:   Femur fracture, left (HCC)   ESRD on dialysis ([place])   Aspiration into airway   Acute encephalopathy secondary to intradialytic hypotension -Could not adequately protect airway and was requiring high amounts of oxygen which led to intubation -Continue to monitor now on sedation  Acute hypoxemic respiratory failure suspected secondary to aspiration -Chest x-ray with no significant findings -Started on IV Zosyn empirically for coverage -Check procalcitonin  Comminuted distal left femur fracture status post fall -EDP discussed with orthopedics with plan for  operative intervention by 9/12 -Pain management -Holding Eliquis   Hyperkalemia -Hemodialysis per nephrology today -Follow a.m. labs -Monitor on telemetry  Anemia of ESRD -Significant drop in hemoglobin due to blood loss with hemodialysis on 9/9 -1 unit PRBC transfusion ordered   ESRD on HD MWF with prior history of renal transplant -Hemodialysis scheduled for today per nephrology service -She is on prednisone, hold Prograf given suspicion of aspiration pneumonia   Chronic systolic and diastolic CHF -LVEF [id number] -Does not appear significantly volume overloaded   Essential hypertension -Monitor closely with propofol for sedation -Hold blood pressure agents given hypotension   Dyslipidemia -Hold statin   Type 2 diabetes with nephropathy and gastroparesis -Hemoglobin A1c 6.7% -Continue SSI now every 4 hours -Continue Reglan    DVT prophylaxis:Heparin Code Status: Full Family Communication: Discussed with husband on phone 9/10 Disposition Plan:  Status is: Inpatient  Remains inpatient appropriate because:Hemodynamically unstable, Altered mental status, Ongoing diagnostic testing needed not appropriate for outpatient work up, Unsafe d/c plan, IV treatments appropriate due to intensity of illness or inability to take PO, and Inpatient level of care appropriate due to severity of illness  Dispo: The patient is from: Home              Anticipated d/c is to: SNF              Patient currently is not medically stable to d/c.   Difficult to place patient No   Consultants:  Nephrology General Surgery Orthopedics PCCM  Procedures:  See below Femoral HD cath on 9/10 Intubation 9/10  Antimicrobials:  Anti-infectives (From admission, onward)    Start  Dose/Rate Route Frequency Ordered Stop   11/13/20 1400  piperacillin-tazobactam (ZOSYN) IVPB 2.25 g  Status:  Discontinued        2.25 g 100 mL/hr over 30 Minutes Intravenous Every 8 hours 11/13/20 1236 11/13/20 1257    11/13/20 1400  piperacillin-tazobactam (ZOSYN) 2.25 g in sodium chloride 0.9 % 50 mL IVPB        2.25 g 100 mL/hr over 30 Minutes Intravenous Every 8 hours 11/13/20 1257     12/01/2020 2215  ceFAZolin (ANCEF) IVPB 2g/100 mL premix        2 g 200 mL/hr over 30 Minutes Intravenous  Once 11/23/2020 2126 11/11/2020 2202   11/27/2020 1430  ceFAZolin (ANCEF) IVPB 2g/100 mL premix  Status:  Discontinued        2 g 200 mL/hr over 30 Minutes Intravenous  Once 11/27/2020 1416 11/05/2020 2126       Subjective: Patient seen and evaluated this a.m. with no new complaints or concerns noted.  She was anticipating hemodialysis after femoral cath placement today.  She is now sedated and intubated after encephalopathy from intradialytic hypotension as well as aspiration.  Objective: Vitals:   11/13/20 1415 11/13/20 1430 11/13/20 1445 11/13/20 1500  BP: (!) 112/54 (!) 143/40 128/68 (!) 148/60  Pulse: 65 65 68 70  Resp: '16 12 15 15  '$ Temp:    (!) 97.5 F (36.4 C)  TempSrc:    Axillary  SpO2:    100%  Weight:      Height:        Intake/Output Summary (Last 24 hours) at 11/13/2020 1514 Last data filed at 11/13/2020 0838 Gross per 24 hour  Intake 483 ml  Output 100 ml  Net 383 ml   Filed Weights   12/03/2020 0851 11/30/2020 2000  Weight: 65.8 kg 67.9 kg    Examination:  General exam: Appears sedated Respiratory system: Clear to auscultation. Respiratory effort normal.  Currently intubated Cardiovascular system: S1 & S2 heard, RRR.  Gastrointestinal system: Abdomen is soft Central nervous system: Sedated on ventilator Extremities: No edema, left intradialytic femoral catheter noted Skin: No significant lesions noted Psychiatry: Flat affect.    Data Reviewed: I have personally reviewed following labs and imaging studies  CBC: Recent Labs  Lab 12/02/2020 1015 11/13/20 0548  WBC 19.2* 16.6*  NEUTROABS 16.1*  --   HGB 7.4* 6.4*  HCT 25.0* 22.3*  MCV 96.5 97.4  PLT 227 [id number]   Basic Metabolic  Panel: Recent Labs  Lab 11/21/2020 1015 11/13/20 0548  NA 139 140  K 6.0* 5.9*  CL 101 102  CO2 19* 18*  GLUCOSE 219* 153*  BUN 91* 92*  CREATININE 12.96* 13.54*  CALCIUM 8.1* 7.7*  MG  --  2.1   GFR: Estimated Creatinine Clearance: 4.3 mL/min (A) (by C-G formula based on SCr of 13.54 mg/dL (H)). Liver Function Tests: Recent Labs  Lab 11/09/2020 1015  AST 27  ALT 34  ALKPHOS 101  BILITOT 0.5  PROT 6.6  ALBUMIN 2.7*   No results for input(s): LIPASE, AMYLASE in the last 168 hours. No results for input(s): AMMONIA in the last 168 hours. Coagulation Profile: No results for input(s): INR, PROTIME in the last 168 hours. Cardiac Enzymes: No results for input(s): CKTOTAL, CKMB, CKMBINDEX, TROPONINI in the last 168 hours. BNP (last 3 results) No results for input(s): PROBNP in the last 8760 hours. HbA1C: Recent Labs    11/11/2020 1015  HGBA1C 6.7*   CBG: Recent Labs  Lab 11/25/2020 2050  11/13/20 0750 11/13/20 1239  GLUCAP 197* 141* 138*   Lipid Profile: No results for input(s): CHOL, HDL, LDLCALC, TRIG, CHOLHDL, LDLDIRECT in the last 72 hours. Thyroid Function Tests: No results for input(s): TSH, T4TOTAL, FREET4, T3FREE, THYROIDAB in the last 72 hours. Anemia Panel: No results for input(s): VITAMINB12, FOLATE, FERRITIN, TIBC, IRON, RETICCTPCT in the last 72 hours. Sepsis Labs: No results for input(s): PROCALCITON, LATICACIDVEN in the last 168 hours.  Recent Results (from the past 240 hour(s))  Resp Panel by RT-PCR (Flu A&B, Covid) Nasopharyngeal Swab     Status: None   Collection Time: 12/01/2020 10:26 AM   Specimen: Nasopharyngeal Swab; Nasopharyngeal(NP) swabs in vial transport medium  Result Value Ref Range Status   SARS Coronavirus 2 by RT PCR NEGATIVE NEGATIVE Final    Comment: (NOTE) SARS-CoV-2 target nucleic acids are NOT DETECTED.  The SARS-CoV-2 RNA is generally detectable in upper respiratory specimens during the acute phase of infection. The  lowest concentration of SARS-CoV-2 viral copies this assay can detect is 138 copies/mL. A negative result does not preclude SARS-Cov-2 infection and should not be used as the sole basis for treatment or other patient management decisions. A negative result may occur with  improper specimen collection/handling, submission of specimen other than nasopharyngeal swab, presence of viral mutation(s) within the areas targeted by this assay, and inadequate number of viral copies(<138 copies/mL). A negative result must be combined with clinical observations, patient history, and epidemiological information. The expected result is Negative.  Fact Sheet for Patients:  EntrepreneurPulse.com.au  Fact Sheet for Healthcare Providers:  IncredibleEmployment.be  This test is no t yet approved or cleared by the [place] FDA and  has been authorized for detection and/or diagnosis of SARS-CoV-2 by FDA under an Emergency Use Authorization (EUA). This EUA will remain  in effect (meaning this test can be used) for the duration of the COVID-19 declaration under Section 564(b)(1) of the Act, 21 U.S.C.section 360bbb-3(b)(1), unless the authorization is terminated  or revoked sooner.       Influenza A by PCR NEGATIVE NEGATIVE Final   Influenza B by PCR NEGATIVE NEGATIVE Final    Comment: (NOTE) The Xpert Xpress SARS-CoV-2/FLU/RSV plus assay is intended as an aid in the diagnosis of influenza from Nasopharyngeal swab specimens and should not be used as a sole basis for treatment. Nasal washings and aspirates are unacceptable for Xpert Xpress SARS-CoV-2/FLU/RSV testing.  Fact Sheet for Patients: EntrepreneurPulse.com.au  Fact Sheet for Healthcare Providers: IncredibleEmployment.be  This test is not yet approved or cleared by the [place] FDA and has been authorized for detection and/or diagnosis of SARS-CoV-2 by FDA under  an Emergency Use Authorization (EUA). This EUA will remain in effect (meaning this test can be used) for the duration of the COVID-19 declaration under Section 564(b)(1) of the Act, 21 U.S.C. section 360bbb-3(b)(1), unless the authorization is terminated or revoked.  Performed at [facility], [address] [place], [place] [address]          Radiology Studies: CT Knee Left Wo Contrast  Result Date: 11/25/2020 CLINICAL DATA:  Knee instability EXAM: CT OF THE left KNEE WITHOUT CONTRAST TECHNIQUE: Multidetector CT imaging of the left knee was performed according to the standard protocol. Multiplanar CT image reconstructions were also generated. COMPARISON:  Radiograph same day FINDINGS: Bones/Joint/Cartilage There is a comminuted distal femur fracture with impaction and intra-articular extension through the intercondylar notch. There is mild posteromedial displacement by 4-6 mm. There is no evidence of proximal tibia, proximal  fibular, or patellar fracture. There is a moderate-sized lipohemarthrosis. Ligaments Suboptimally assessed by CT. Muscles and Tendons Generalized loss of muscle bulk with mild muscle atrophy. No intramuscular collection. Soft tissues Extensive soft tissue swelling along the distal femur and knee. Vascular calcifications. IMPRESSION: Comminuted, intra-articular distal femur fracture with impaction and mild posteromedial displacement. Moderate-sized lipohemarthrosis. Electronically Signed   By: Maurine Simmering M.D.   On: 11/18/2020 13:31   CT 3D RECON AT SCANNER  Result Date: 11/11/2020 CLINICAL DATA:  Knee instability EXAM: 3-DIMENSIONAL CT IMAGE RENDERING ON ACQUISITION WORKSTATION TECHNIQUE: 3-dimensional CT images were rendered by post-processing of the original CT data on an acquisition workstation. The 3-dimensional CT images were interpreted and findings were reported in the accompanying complete CT report for this study COMPARISON:  None. FINDINGS: 3D reconstructions of the  left knee or performed. Again seen is a comminuted distal femur fracture with intra-articular extension through the intercondylar notch. There is mild posteromedial displacement and mild impaction. IMPRESSION: 3D reconstruction of the left knee demonstrating the comminuted distal femur fracture with intra-articular extension through the intercondylar notch Electronically Signed   By: Maurine Simmering M.D.   On: 11/08/2020 14:06   DG Chest Port 1 View  Result Date: 11/13/2020 CLINICAL DATA:  Evaluate for endotracheal tube placement. EXAM: PORTABLE CHEST 1 VIEW COMPARISON:  March 29, 2020 FINDINGS: Endotracheal tube is identified with distal tip 4.8 cm from carina. There is no pneumothorax. The lungs are clear. The mediastinal contour and cardiac silhouette are normal. The osseous structures are stable. IMPRESSION: Endotracheal tube identified in good position.  No pneumothorax. Electronically Signed   By: Abelardo Diesel M.D.   On: 11/13/2020 13:19   DG Knee Complete 4 Views Left  Result Date: 11/29/2020 CLINICAL DATA:  [place] yesterday. Pain and swelling. EXAM: LEFT KNEE - COMPLETE 4+ VIEW COMPARISON:  None. FINDINGS: Comminuted but minimally displaced fracture involving the distal femur. Intra-articular involvement with large hemarthrosis. The tibia and fibula are intact. Extensive age advanced vascular calcifications. IMPRESSION: Comminuted distal femur fracture with intra-articular involvement. Electronically Signed   By: Marijo Sanes M.D.   On: 11/23/2020 09:57        Scheduled Meds:  sodium chloride   Intravenous Once   amiodarone  100 mg Oral Daily   ARIPiprazole  5 mg Oral QHS   aspirin EC  81 mg Oral Q breakfast   calcitRIOL  0.75 mcg Oral Q T,Th,Sat-1800   Chlorhexidine Gluconate Cloth  6 each Topical Q0600   darbepoetin (ARANESP) injection - DIALYSIS  40 mcg Intravenous Once   docusate  100 mg Per Tube BID   heparin  5,000 Units Subcutaneous Q8H   insulin aspart  0-5 Units Subcutaneous QHS    insulin aspart  0-6 Units Subcutaneous TID WC   metoCLOPramide  5 mg Oral TID AC   metoprolol succinate  25 mg Oral Daily   midazolam PF       mirtazapine  15 mg Oral QHS   multivitamin  1 tablet Oral Daily   pantoprazole  40 mg Oral Daily   polyethylene glycol  17 g Per Tube Daily   predniSONE  5 mg Oral Q breakfast   rosuvastatin  20 mg Oral Daily   senna  1 tablet Oral BID   sertraline  100 mg Oral QHS   sevelamer carbonate  3,200 mg Oral TID with meals   sodium chloride flush  3 mL Intravenous Q12H   tacrolimus  0.5 mg Oral Daily   Continuous  Infusions:  sodium chloride     sodium chloride     sodium chloride     sodium chloride     sodium chloride     piperacillin-tazobactam (ZOSYN)  IV     propofol (DIPRIVAN) infusion 5 mcg/kg/min (11/13/20 1430)     LOS: 1 day    Critical care time: 70 minutes.    Bayan Kushnir Darleen Crocker, DO [facility]  If 7PM-7AM, please contact night-coverage [email or web address] 11/13/2020, 3:14 PM

## 2020-11-13 NOTE — Plan of Care (Signed)
  Problem: Education: Goal: Knowledge of General Education information will improve Description: Including pain rating scale, medication(s)/side effects and non-pharmacologic comfort measures Outcome: Progressing   Problem: Health Behavior/Discharge Planning: Goal: Ability to manage health-related needs will improve Outcome: Progressing   Problem: Clinical Measurements: Goal: Ability to maintain clinical measurements within normal limits will improve Outcome: Progressing Goal: Will remain free from infection Outcome: Progressing Goal: Diagnostic test results will improve Outcome: Progressing Goal: Respiratory complications will improve Outcome: Progressing Goal: Cardiovascular complication will be avoided Outcome: Progressing   Problem: Activity: Goal: Risk for activity intolerance will decrease Outcome: Progressing   Problem: Nutrition: Goal: Adequate nutrition will be maintained Outcome: Progressing   Problem: Coping: Goal: Level of anxiety will decrease Outcome: Progressing   Problem: Elimination: Goal: Will not experience complications related to bowel motility Outcome: Progressing Goal: Will not experience complications related to urinary retention Outcome: Progressing   Problem: Pain Managment: Goal: General experience of comfort will improve Outcome: Progressing   Problem: Safety: Goal: Ability to remain free from injury will improve Outcome: Progressing   Problem: Skin Integrity: Goal: Risk for impaired skin integrity will decrease Outcome: Progressing   Problem: Education: Goal: Knowledge of General Education information will improve Description: Including pain rating scale, medication(s)/side effects and non-pharmacologic comfort measures Outcome: Progressing   Problem: Health Behavior/Discharge Planning: Goal: Ability to manage health-related needs will improve Outcome: Progressing   Problem: Clinical Measurements: Goal: Ability to maintain  clinical measurements within normal limits will improve Outcome: Progressing Goal: Will remain free from infection Outcome: Progressing Goal: Diagnostic test results will improve Outcome: Progressing Goal: Respiratory complications will improve Outcome: Progressing Goal: Cardiovascular complication will be avoided Outcome: Progressing   Problem: Activity: Goal: Risk for activity intolerance will decrease Outcome: Progressing   Problem: Nutrition: Goal: Adequate nutrition will be maintained Outcome: Progressing   Problem: Coping: Goal: Level of anxiety will decrease Outcome: Progressing   Problem: Elimination: Goal: Will not experience complications related to bowel motility Outcome: Progressing Goal: Will not experience complications related to urinary retention Outcome: Progressing   Problem: Pain Managment: Goal: General experience of comfort will improve Outcome: Progressing   Problem: Safety: Goal: Ability to remain free from injury will improve Outcome: Progressing   Problem: Skin Integrity: Goal: Risk for impaired skin integrity will decrease Outcome: Progressing   Problem: Education: Goal: Ability to verbalize understanding of medication therapies will improve Outcome: Progressing   Problem: Cardiac: Goal: Ability to achieve and maintain adequate cardiopulmonary perfusion will improve Outcome: Progressing

## 2020-11-13 NOTE — H&P (Addendum)
NAME:  [name], MRN:  [id number], DOB:  1955/06/20, LOS: 1 ADMISSION DATE:  11/25/2020, CONSULTATION DATE: 11/13/2020 REFERRING MD: Heath Lark DO, CHIEF COMPLAINT:  L femur fracture  History of Present Illness:  65 year old with hypertension, hyperlipidemia, diabetes, afib, systolic and diastolic CHF, end-stage renal disease presenting with left distal femur fracture after a fall.  Ortho is planning for OR intervention on 9/12 at [facility]  Attempted hemodialysis on 9/9 but aborted due to clotting of AV fistula, unable to return 300 cc of blood. She then had a femoral catheter placed and dialyzed today 9/10 with 1 unit of blood due to blood loss anemia.  During dialysis she developed acute respiratory failure, possible aspiration and emergently intubated.    PCCM consulted for transfer to [facility] for further management  Pertinent  Medical History   Past Medical History:  Diagnosis Date   Abnormal CXR (chest x-ray) 05/25/2019   Abnormal LFTs (liver function tests) 03/11/2018   Acute hypoxemic respiratory failure ([place]) 12/16/2018   Acute on chronic combined systolic and diastolic CHF (congestive heart failure) -EF 40 to 45 % 08/10/2019   Acute respiratory failure with hypoxia ([place]) 12/02/2018   Anemia    of chronic disease   Blood transfusion without reported diagnosis    Cardiovascular disease    nonobstructive   Carotid artery stenosis 2008   CHF (congestive heart failure) ([place])    Coronary artery disease    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   Last Assessment & Plan:  - Continuing home suppression therapy: Bactrim M-W-F, Prednisone 5 mg daily, Myfortic 360 mg BID and Prograf 1 mg BID - Appreciate Nephrology recs   Diabetes mellitus    Dyslipidemia    Edema of lower extremity    with hypo-albuminemia and profound protenuria   HCAP (healthcare-associated pneumonia)    Heart murmur    Hypertension    Lung collapse 12/15/2018   Mitral regurgitation    Nephrotic  syndrome    Patent foramen ovale    Pulmonary edema 08/10/2019   Pulmonary hypertension, moderate to severe [facility])    Pulmonary nodule    Renal insufficiency    Tricuspid regurgitation    Volume depletion, renal, due to output loss (renal deficit)   Afib rvr 07/2020 on amiodarone  Significant Hospital Events: Including procedures, antibiotic start and stop dates in addition to other pertinent events   9/9 Admitted after a fall with femur fracture, HD catheter placed due to a clotted AV fistula 9/10 intubated for acute respiratory failure, aspiration  Interim History / Subjective:  Patient arrived from [facility] to [facility] icu Patient is sedated on propofol and on 12 mcg of levo  Objective   Blood pressure (!) 101/50, pulse 72, temperature 98.2 F (36.8 C), temperature source Oral, resp. rate 16, height '5\' 9"'$  (1.753 m), weight 67.9 kg, SpO2 100 %.    FiO2 (%):  [100 %] 100 % Set Rate:  [15 bmp] 15 bmp Vt Set:  [530 mL] 530 mL PEEP:  [5 cmH20] 5 cmH20 Plateau Pressure:  [19 cmH20] 19 cmH20   Intake/Output Summary (Last 24 hours) at 11/13/2020 1342 Last data filed at 11/13/2020 0838 Gross per 24 hour  Intake 483 ml  Output 100 ml  Net 383 ml   Filed Weights   11/21/2020 0851 11/25/2020 2000  Weight: 65.8 kg 67.9 kg    Examination: General:  critically ill appearing on mech vent HEENT: MM pink/moist; ETT in place Neuro: sedated on propofol; opens eyes  to command CV: s1s2, RRR, no m/r/g PULM:  dim clear BS bilaterally; on mech vent PRVC 60% GI: soft, bsx4 active  Extremities: warm/dry, no edema; right fem hd cath in place   Resolved Hospital Problem list     Assessment & Plan:  Fall, left femur fracture P: -per ortho -Tentative plan for or intervention by orthopedics on 9/12 -knee immobilizer and dressing in place  Acute respiratory failure w/ hypoxia Possible aspiration pneumonia P: -continue mechanical ventilation PRVC 6-8 cc/kg -VAP prevention in place -wean fio2 for sats  >92% -daily sbt/sat -wean sedation for RASS 0 to -1 -tracheal aspirate ordered -continue zosyn for aspiration ppx -morning CXR -trend CBC/fever curve  Shock: likely hypovolemic given hgb 6.4  ABLA: 9/10 given 1 unit of PRBCs P: -will give another unit PRBCs while hypotensive -recheck cbc -transfuse for hgb <7 -continue zosyn for aspiration pneumonia ppx and follow trach culture  Acute encephalopathy secondary to intradialytic hypotension P: -limit sedation for neuro checks -supportive care  ESRD: on HD MWF; hx of renal transplant Hyperkalemia AGMA P: -nephro following -Failed dialysis 9/9 due to clotted AV fistula -Undergoing dialysis with temporary femoral HD catheter -trend BMP and UOP  Chronic systolic and diastolic CHF: LVEF [id number] P: -daily weights -fluid restrict -hold home metoprolol  Hx HTN and HLD P: -continue statin -hold home meds in setting of hypotension  Hx of afib P: -continue amiodarone -holding eliquis while awaiting surgery on 9/12 -holding metoprolol in setting of hypotension  T2DM w/ nephropathy and gastroparesis P: -SSI and CBG monitoring -continue reglan  Hx of generalized anxiety disorder P: -continue abilify and zoloft -will hold mirtazapine while on sedation  Best Practice (right click and "Reselect all SmartList Selections" daily)   Diet/type: NPO w/ meds via tube DVT prophylaxis: prophylactic heparin ; home eliquis on hold for surgery 9/12 GI prophylaxis: PPI Lines: Dialysis Catheter Foley:  N/A Code Status:  full code Last date of multidisciplinary goals of care discussion [pending]  Labs   CBC: Recent Labs  Lab 11/11/2020 1015 11/13/20 0548  WBC 19.2* 16.6*  NEUTROABS 16.1*  --   HGB 7.4* 6.4*  HCT 25.0* 22.3*  MCV 96.5 97.4  PLT 227 [id number]    Basic Metabolic Panel: Recent Labs  Lab 11/07/2020 1015 11/13/20 0548  NA 139 140  K 6.0* 5.9*  CL 101 102  CO2 19* 18*  GLUCOSE 219* 153*  BUN 91* 92*  CREATININE  12.96* 13.54*  CALCIUM 8.1* 7.7*  MG  --  2.1   GFR: Estimated Creatinine Clearance: 4.3 mL/min (A) (by C-G formula based on SCr of 13.54 mg/dL (H)). Recent Labs  Lab 11/22/2020 1015 11/13/20 0548  WBC 19.2* 16.6*    Liver Function Tests: Recent Labs  Lab 11/06/2020 1015  AST 27  ALT 34  ALKPHOS 101  BILITOT 0.5  PROT 6.6  ALBUMIN 2.7*   No results for input(s): LIPASE, AMYLASE in the last 168 hours. No results for input(s): AMMONIA in the last 168 hours.  ABG    Component Value Date/Time   TCO2 28 07/20/2019 1354     Coagulation Profile: No results for input(s): INR, PROTIME in the last 168 hours.  Cardiac Enzymes: No results for input(s): CKTOTAL, CKMB, CKMBINDEX, TROPONINI in the last 168 hours.  HbA1C: HB A1C (BAYER DCA - WAIVED)  Date/Time Value Ref Range Status  10/19/2020 09:41 AM 6.7 <7.0 % Final    Comment:  Diabetic Adult            <7.0                                       Healthy Adult        4.3 - 5.7                                                           (DCCT/NGSP) American Diabetes Association's Summary of Glycemic Recommendations for Adults with Diabetes: Hemoglobin A1c <7.0%. More stringent glycemic goals (A1c <6.0%) may further reduce complications at the cost of increased risk of hypoglycemia.   10/01/2018 11:48 AM 9.7 (H) <7.0 % Final    Comment:                                          Diabetic Adult            <7.0                                       Healthy Adult        4.3 - 5.7                                                           (DCCT/NGSP) American Diabetes Association's Summary of Glycemic Recommendations for Adults with Diabetes: Hemoglobin A1c <7.0%. More stringent glycemic goals (A1c <6.0%) may further reduce complications at the cost of increased risk of hypoglycemia.    Hgb A1c MFr Bld  Date/Time Value Ref Range Status  11/25/2020 10:15 AM 6.7 (H) 4.8 - 5.6 % Final     Comment:    (NOTE) Pre diabetes:          5.7%-6.4%  Diabetes:              >6.4%  Glycemic control for   <7.0% adults with diabetes   01/06/2020 04:34 PM 8.7 (H) 4.8 - 5.6 % Final    Comment:    (NOTE) Pre diabetes:          5.7%-6.4%  Diabetes:              >6.4%  Glycemic control for   <7.0% adults with diabetes     CBG: Recent Labs  Lab 11/05/2020 2050 11/13/20 0750 11/13/20 1239  GLUCAP 197* 141* 138*    Review of Systems:   Unable to obtain due to intubated/sedated. Obtained information from chart and nurse.  Past Medical History:  She,  has a past medical history of Abnormal CXR (chest x-ray) (05/25/2019), Abnormal LFTs (liver function tests) (03/11/2018), Acute hypoxemic respiratory failure ([place]) (12/16/2018), Acute on chronic combined systolic and diastolic CHF (congestive heart failure) -EF 40 to 45 % (08/10/2019), Acute respiratory failure with hypoxia ([place]) (12/02/2018), Anemia, Blood transfusion without reported diagnosis, Cardiovascular disease, Carotid artery stenosis (2008),  CHF (congestive heart failure) ([place]), Coronary artery disease, Deceased-donor kidney transplant recipient (05/22/2016), Diabetes mellitus, Dyslipidemia, Edema of lower extremity, HCAP (healthcare-associated pneumonia), Heart murmur, Hypertension, Lung collapse (12/15/2018), Mitral regurgitation, Nephrotic syndrome, Patent foramen ovale, Pulmonary edema (08/10/2019), Pulmonary hypertension, moderate to severe [facility]), Pulmonary nodule, Renal insufficiency, Tricuspid regurgitation, and Volume depletion, renal, due to output loss (renal deficit).   Surgical History:   Past Surgical History:  Procedure Laterality Date   A/V FISTULAGRAM N/A 04/05/2017   Procedure: A/V FISTULAGRAM - Left Arm;  Surgeon: Angelia Mould, MD;  Location: [place] [facility];  Service: Cardiovascular;  Laterality: N/A;   A/V FISTULAGRAM Left 09/18/2017   Procedure: A/V FISTULAGRAM;  Surgeon: Serafina Mitchell, MD;  Location:  [place] [facility];  Service: Cardiovascular;  Laterality: Left;   A/V SHUNT INTERVENTION Left 04/05/2017   Procedure: A/V SHUNT INTERVENTION;  Surgeon: Angelia Mould, MD;  Location: [place] [facility];  Service: Cardiovascular;  Laterality: Left;   CARDIAC CATHETERIZATION  2008   IR REMOVAL TUN CV CATH W/O FL  11/02/2016   KIDNEY TRANSPLANT Right 02/23/2009   PERIPHERAL VASCULAR BALLOON ANGIOPLASTY Left 09/18/2017   Procedure: PERIPHERAL VASCULAR BALLOON ANGIOPLASTY;  Surgeon: Serafina Mitchell, MD;  Location: [place] [facility];  Service: Cardiovascular;  Laterality: Left;  Arm Fistula     Social History:   reports that she has never smoked. She has never used smokeless tobacco. She reports that she does not drink alcohol and does not use drugs.   Family History:  Her family history includes Diabetes in her father and mother; Heart disease in her father; Kidney disease in her mother.   Allergies Allergies  Allergen Reactions   Other Other (See Comments)    Tape Bruises and tears skin, Paper tape tolerated   Tape Other (See Comments)    Bruises and tears skin Paper tape tolerated Bruises and tears skin Paper tape tolerated Bruises and tears skin Paper tape tolerated     Home Medications  Prior to Admission medications   Medication Sig Start Date End Date Taking? Authorizing Provider  acetaminophen (TYLENOL) 500 MG tablet Take 500 mg by mouth every 6 (six) hours as needed for moderate pain or headache.  04/06/16   [provider]  amiodarone (PACERONE) 100 MG tablet Take 100 mg by mouth daily. 07/09/20   [provider]  amiodarone (PACERONE) 400 MG tablet Take 1 tablet (400 mg total) by mouth daily. Patient not taking: No sig reported 07/08/20   Claretta Fraise, MD  amLODipine (NORVASC) 5 MG tablet Hold until follow-up with PCP or cardiology Patient not taking: No sig reported 06/26/20   [provider]  apixaban (ELIQUIS) 2.5 MG TABS tablet Take 1  tablet (2.5 mg total) by mouth 2 (two) times daily. 07/08/20   Claretta Fraise, MD  ARIPiprazole (ABILIFY) 5 MG tablet Take 1 tablet (5 mg total) by mouth at bedtime. 04/27/20   Claretta Fraise, MD  aspirin (ASPIR-LOW) 81 MG EC tablet Take 1 tablet (81 mg total) by mouth daily with breakfast. 05/27/19   Emokpae, Courage, MD  B Complex-C-Folic Acid (RENA-VITE RX) 1 MG TABS Take 1 tablet by mouth daily. 09/24/18   [provider]  BRILINTA 90 MG TABS tablet Take 90 mg by mouth 2 (two) times daily. 05/13/20   [provider]  cloNIDine (CATAPRES) 0.1 MG tablet Take 1 tablet (0.1 mg total) by mouth daily. 01/13/20   Sharion Balloon, FNP  clopidogrel (PLAVIX) 75 MG  tablet Take 75 mg by mouth daily. 05/07/20   [provider]  diclofenac Sodium (VOLTAREN) 1 % GEL Apply 2 g topically 4 (four) times daily. 02/13/20   Ivy Lynn, NP  Dulaglutide (TRULICITY) 1.5 0000000 SOPN INJECT 1.'5MG'$   SUBCUTANEOUSLY ONCE A WEEK. 10/19/20   Claretta Fraise, MD  insulin degludec (TRESIBA FLEXTOUCH) 100 UNIT/ML FlexTouch Pen Inject 15 Units into the skin daily. 04/27/20   Claretta Fraise, MD  isosorbide mononitrate (IMDUR) 30 MG 24 hr tablet Hold until follow-up with PCP or cardiology  (previous dose: 30 daily) Patient not taking: No sig reported 06/26/20   [provider]  isosorbide mononitrate (IMDUR) 60 MG 24 hr tablet Take 1 tablet (60 mg total) by mouth daily. 01/13/20   Evelina Dun A, FNP  Lidocaine 4 % PTCH Place onto the skin. 07/16/20   [provider]  lisinopril (ZESTRIL) 10 MG tablet Hold until follow-up with PCP  (usual dose 10 mg daily) Patient not taking: No sig reported 06/26/20   [provider]  metoCLOPramide (REGLAN) 5 MG tablet Take 1 tablet (5 mg total) by mouth 4 (four) times daily -  before meals and at bedtime. Patient taking differently: Take 5 mg by mouth 3 (three) times daily before meals. 12/24/19   Rolland Porter, MD  metoprolol succinate (TOPROL-XL)  25 MG 24 hr tablet Take 1 tablet (25 mg total) by mouth daily. 01/13/20   Evelina Dun A, FNP  mirtazapine (REMERON) 15 MG tablet Take 1 tablet (15 mg total) by mouth at bedtime. For sleep 10/19/20   Claretta Fraise, MD  nitroGLYCERIN (NITROSTAT) 0.4 MG SL tablet Place under the tongue. 05/18/20   [provider]  ondansetron (ZOFRAN-ODT) 4 MG disintegrating tablet Take 4 mg by mouth every 8 (eight) hours as needed. 07/06/20   [provider]  pantoprazole (PROTONIX) 40 MG tablet Take 1 tablet by mouth daily. 06/27/20   [provider]  predniSONE (DELTASONE) 5 MG tablet Take 1 tablet (5 mg total) by mouth daily with breakfast. 07/22/19   Barton Dubois, MD  PROGRAF 0.5 MG capsule Take 0.5 mg by mouth daily.  10/24/16   [provider]  rosuvastatin (CRESTOR) 20 MG tablet Take 1 tablet (20 mg total) by mouth daily. 10/19/20   Claretta Fraise, MD  senna (SENOKOT) 8.6 MG TABS tablet Take 1 tablet (8.6 mg total) by mouth 2 (two) times daily. 05/27/19   Roxan Hockey, MD  sertraline (ZOLOFT) 100 MG tablet Take 1 tablet (100 mg total) by mouth at bedtime. For depression 10/19/20   Claretta Fraise, MD  sevelamer carbonate (RENVELA) 800 MG tablet Take 1,600-3,200 mg by mouth 5 (five) times daily. Patient takes 4 tablets('3200mg'$ ) 3 times a day with meals and 2 tablets('1600mg'$ ) 2 times a day with snacks 10/02/16   [provider]     Critical care time: 45 minutes    JD Rexene Agent [facility] 11/14/2020, 3:08 AM  Please see [email or web address] for pager details.  From 7A-7P if no response, please call [phone]. After hours, please call ELink [phone].

## 2020-11-14 ENCOUNTER — Inpatient Hospital Stay (HOSPITAL_COMMUNITY): Payer: Medicare HMO

## 2020-11-14 DIAGNOSIS — N186 End stage renal disease: Secondary | ICD-10-CM

## 2020-11-14 DIAGNOSIS — D849 Immunodeficiency, unspecified: Secondary | ICD-10-CM | POA: Diagnosis present

## 2020-11-14 DIAGNOSIS — S7292XA Unspecified fracture of left femur, initial encounter for closed fracture: Secondary | ICD-10-CM | POA: Diagnosis not present

## 2020-11-14 DIAGNOSIS — D62 Acute posthemorrhagic anemia: Secondary | ICD-10-CM | POA: Diagnosis present

## 2020-11-14 DIAGNOSIS — Z978 Presence of other specified devices: Secondary | ICD-10-CM | POA: Diagnosis present

## 2020-11-14 DIAGNOSIS — T17908A Unspecified foreign body in respiratory tract, part unspecified causing other injury, initial encounter: Secondary | ICD-10-CM | POA: Diagnosis not present

## 2020-11-14 DIAGNOSIS — Z7952 Long term (current) use of systemic steroids: Secondary | ICD-10-CM

## 2020-11-14 DIAGNOSIS — E1122 Type 2 diabetes mellitus with diabetic chronic kidney disease: Secondary | ICD-10-CM

## 2020-11-14 DIAGNOSIS — I5042 Chronic combined systolic (congestive) and diastolic (congestive) heart failure: Secondary | ICD-10-CM

## 2020-11-14 DIAGNOSIS — Z992 Dependence on renal dialysis: Secondary | ICD-10-CM

## 2020-11-14 DIAGNOSIS — R579 Shock, unspecified: Secondary | ICD-10-CM | POA: Diagnosis not present

## 2020-11-14 DIAGNOSIS — L8990 Pressure ulcer of unspecified site, unspecified stage: Secondary | ICD-10-CM | POA: Insufficient documentation

## 2020-11-14 DIAGNOSIS — I959 Hypotension, unspecified: Secondary | ICD-10-CM | POA: Diagnosis present

## 2020-11-14 DIAGNOSIS — T148XXA Other injury of unspecified body region, initial encounter: Secondary | ICD-10-CM | POA: Diagnosis present

## 2020-11-14 LAB — RENAL FUNCTION PANEL
Albumin: 2.3 g/dL — ABNORMAL LOW (ref 3.5–5.0)
Anion gap: 19 — ABNORMAL HIGH (ref 5–15)
BUN: 27 mg/dL — ABNORMAL HIGH (ref 8–23)
CO2: 17 mmol/L — ABNORMAL LOW (ref 22–32)
Calcium: 7 mg/dL — ABNORMAL LOW (ref 8.9–10.3)
Chloride: 96 mmol/L — ABNORMAL LOW (ref 98–111)
Creatinine, Ser: 5.81 mg/dL — ABNORMAL HIGH (ref 0.44–1.00)
GFR, Estimated: 8 mL/min — ABNORMAL LOW (ref >60)
Glucose, Bld: 184 mg/dL — ABNORMAL HIGH (ref 70–99)
Phosphorus: 6.4 mg/dL — ABNORMAL HIGH (ref 2.5–4.6)
Potassium: 3.8 mmol/L (ref 3.5–5.1)
Sodium: 132 mmol/L — ABNORMAL LOW (ref 135–145)

## 2020-11-14 LAB — LACTIC ACID, PLASMA
Lactic Acid, Venous: 2.3 mmol/L (ref 0.5–1.9)
Lactic Acid, Venous: 1.7 mmol/L (ref 0.5–1.9)

## 2020-11-14 LAB — GLUCOSE, CAPILLARY
Glucose-Capillary: 152 mg/dL — ABNORMAL HIGH (ref 70–99)
Glucose-Capillary: 173 mg/dL — ABNORMAL HIGH (ref 70–99)
Glucose-Capillary: 191 mg/dL — ABNORMAL HIGH (ref 70–99)
Glucose-Capillary: 247 mg/dL — ABNORMAL HIGH (ref 70–99)
Glucose-Capillary: 242 mg/dL — ABNORMAL HIGH (ref 70–99)
Glucose-Capillary: 192 mg/dL — ABNORMAL HIGH (ref 70–99)

## 2020-11-14 LAB — IRON AND TIBC
Iron: 19 ug/dL — ABNORMAL LOW (ref 28–170)
Saturation Ratios: 15 % (ref 10.4–31.8)
TIBC: 129 ug/dL — ABNORMAL LOW (ref 250–450)
UIBC: 110 ug/dL

## 2020-11-14 LAB — FERRITIN: Ferritin: 1392 ng/mL — ABNORMAL HIGH (ref 11–307)

## 2020-11-14 LAB — PREPARE RBC (CROSSMATCH)

## 2020-11-14 LAB — CBC
HCT: 24.5 % — ABNORMAL LOW (ref 36.0–46.0)
HCT: 28.6 % — ABNORMAL LOW (ref 36.0–46.0)
Hemoglobin: 7.5 g/dL — ABNORMAL LOW (ref 12.0–15.0)
Hemoglobin: 9.1 g/dL — ABNORMAL LOW (ref 12.0–15.0)
MCH: 29 pg (ref 26.0–34.0)
MCH: 28.9 pg (ref 26.0–34.0)
MCHC: 30.6 g/dL (ref 30.0–36.0)
MCHC: 31.8 g/dL (ref 30.0–36.0)
MCV: 94.6 fL (ref 80.0–100.0)
MCV: 90.8 fL (ref 80.0–100.0)
Platelets: 199 10*3/uL (ref 150–400)
Platelets: 194 10*3/uL (ref 150–400)
RBC: 2.59 MIL/uL — ABNORMAL LOW (ref 3.87–5.11)
RBC: 3.15 MIL/uL — ABNORMAL LOW (ref 3.87–5.11)
RDW: 17.1 % — ABNORMAL HIGH (ref 11.5–15.5)
RDW: 17.5 % — ABNORMAL HIGH (ref 11.5–15.5)
WBC: 32.1 10*3/uL — ABNORMAL HIGH (ref 4.0–10.5)
WBC: 29.4 10*3/uL — ABNORMAL HIGH (ref 4.0–10.5)
nRBC: 0.2 % (ref 0.0–0.2)
nRBC: 0.1 % (ref 0.0–0.2)

## 2020-11-14 LAB — HEMOGLOBIN AND HEMATOCRIT, BLOOD
HCT: 29 % — ABNORMAL LOW (ref 36.0–46.0)
Hemoglobin: 8.8 g/dL — ABNORMAL LOW (ref 12.0–15.0)

## 2020-11-14 LAB — BASIC METABOLIC PANEL
Anion gap: 18 — ABNORMAL HIGH (ref 5–15)
BUN: 34 mg/dL — ABNORMAL HIGH (ref 8–23)
CO2: 17 mmol/L — ABNORMAL LOW (ref 22–32)
Calcium: 6.9 mg/dL — ABNORMAL LOW (ref 8.9–10.3)
Chloride: 94 mmol/L — ABNORMAL LOW (ref 98–111)
Creatinine, Ser: 6.83 mg/dL — ABNORMAL HIGH (ref 0.44–1.00)
GFR, Estimated: 6 mL/min — ABNORMAL LOW (ref >60)
Glucose, Bld: 176 mg/dL — ABNORMAL HIGH (ref 70–99)
Potassium: 4 mmol/L (ref 3.5–5.1)
Sodium: 129 mmol/L — ABNORMAL LOW (ref 135–145)

## 2020-11-14 LAB — TRIGLYCERIDES: Triglycerides: 332 mg/dL — ABNORMAL HIGH (ref <150)

## 2020-11-14 LAB — CORTISOL
Cortisol, Plasma: 20.4 ug/dL
Cortisol, Plasma: 20.7 ug/dL
Cortisol, Plasma: 25 ug/dL

## 2020-11-14 LAB — BRAIN NATRIURETIC PEPTIDE: B Natriuretic Peptide: 1229.2 pg/mL — ABNORMAL HIGH (ref 0.0–100.0)

## 2020-11-14 LAB — TYPE AND SCREEN
ABO/RH(D): A NEG
Antibody Screen: NEGATIVE
Unit division: 0

## 2020-11-14 LAB — BPAM RBC
Blood Product Expiration Date: 202209282359
ISSUE DATE / TIME: 202209101459
Unit Type and Rh: 600

## 2020-11-14 LAB — MRSA NEXT GEN BY PCR, NASAL: MRSA by PCR Next Gen: NOT DETECTED

## 2020-11-14 LAB — MAGNESIUM
Magnesium: 1.7 mg/dL (ref 1.7–2.4)
Magnesium: 1.8 mg/dL (ref 1.7–2.4)

## 2020-11-14 MED ORDER — CHLORHEXIDINE GLUCONATE 0.12% ORAL RINSE (MEDLINE KIT)
15.0000 mL | Freq: Two times a day (BID) | OROMUCOSAL | Status: DC
  Administered 2020-11-14 – 2020-11-18 (×9): 15 mL via OROMUCOSAL

## 2020-11-14 MED ORDER — NOREPINEPHRINE 4 MG/250ML-% IV SOLN
0.0000 ug/min | INTRAVENOUS | Status: DC
  Administered 2020-11-14: 10 ug/min via INTRAVENOUS
  Administered 2020-11-14: 8 ug/min via INTRAVENOUS
  Administered 2020-11-15: 4 ug/min via INTRAVENOUS
  Administered 2020-11-15 – 2020-11-16 (×2): 8 ug/min via INTRAVENOUS
  Administered 2020-11-17: 9 ug/min via INTRAVENOUS
  Administered 2020-11-17 (×2): 10 ug/min via INTRAVENOUS
  Administered 2020-11-17: 11 ug/min via INTRAVENOUS
  Administered 2020-11-18: 9 ug/min via INTRAVENOUS
  Administered 2020-11-18: 8 ug/min via INTRAVENOUS
  Administered 2020-11-19: 3 ug/min via INTRAVENOUS
  Administered 2020-11-19: 12 ug/min via INTRAVENOUS
  Filled 2020-11-14 (×14): qty 250

## 2020-11-14 MED ORDER — PIPERACILLIN-TAZOBACTAM IN DEX 2-0.25 GM/50ML IV SOLN
2.2500 g | Freq: Three times a day (TID) | INTRAVENOUS | Status: DC
  Administered 2020-11-14 – 2020-11-17 (×10): 2.25 g via INTRAVENOUS
  Filled 2020-11-14 (×13): qty 50

## 2020-11-14 MED ORDER — FENTANYL CITRATE (PF) 100 MCG/2ML IJ SOLN
25.0000 ug | Freq: Once | INTRAMUSCULAR | Status: AC
  Administered 2020-11-14: 25 ug via INTRAVENOUS

## 2020-11-14 MED ORDER — FENTANYL BOLUS VIA INFUSION
25.0000 ug | INTRAVENOUS | Status: DC | PRN
  Administered 2020-11-14 – 2020-11-15 (×2): 50 ug via INTRAVENOUS
  Administered 2020-11-15 (×2): 25 ug via INTRAVENOUS
  Administered 2020-11-16 – 2020-11-17 (×3): 100 ug via INTRAVENOUS
  Administered 2020-11-17 (×2): 50 ug via INTRAVENOUS
  Administered 2020-11-18: 100 ug via INTRAVENOUS
  Filled 2020-11-14: qty 100

## 2020-11-14 MED ORDER — SODIUM CHLORIDE 0.9% FLUSH
10.0000 mL | INTRAVENOUS | Status: DC | PRN
  Administered 2020-11-19: 20 mL
  Administered 2020-11-19: 10 mL

## 2020-11-14 MED ORDER — ORAL CARE MOUTH RINSE
15.0000 mL | OROMUCOSAL | Status: DC
  Administered 2020-11-14 – 2020-11-18 (×41): 15 mL via OROMUCOSAL

## 2020-11-14 MED ORDER — MAGNESIUM SULFATE IN D5W 1-5 GM/100ML-% IV SOLN
1.0000 g | Freq: Once | INTRAVENOUS | Status: AC
  Administered 2020-11-14: 1 g via INTRAVENOUS
  Filled 2020-11-14: qty 100

## 2020-11-14 MED ORDER — CALCIUM ACETATE (PHOS BINDER) 667 MG PO CAPS
667.0000 mg | ORAL_CAPSULE | Freq: Three times a day (TID) | ORAL | Status: DC
  Administered 2020-11-16 (×3): 667 mg via ORAL
  Filled 2020-11-14 (×11): qty 1

## 2020-11-14 MED ORDER — COSYNTROPIN 0.25 MG IJ SOLR
0.2500 mg | Freq: Once | INTRAMUSCULAR | Status: AC
  Administered 2020-11-14: 0.25 mg via INTRAVENOUS
  Filled 2020-11-14: qty 0.25

## 2020-11-14 MED ORDER — SODIUM CHLORIDE 0.9% FLUSH
10.0000 mL | Freq: Two times a day (BID) | INTRAVENOUS | Status: DC
Start: 2020-11-14 — End: 2020-11-20
  Administered 2020-11-14 – 2020-11-17 (×8): 10 mL
  Administered 2020-11-18: 20 mL

## 2020-11-14 MED ORDER — FENTANYL 2500MCG IN NS 250ML (10MCG/ML) PREMIX INFUSION
25.0000 ug/h | INTRAVENOUS | Status: DC
  Administered 2020-11-14: 25 ug/h via INTRAVENOUS
  Administered 2020-11-15: 125 ug/h via INTRAVENOUS
  Administered 2020-11-16: 100 ug/h via INTRAVENOUS
  Administered 2020-11-17: 50 ug/h via INTRAVENOUS
  Administered 2020-11-18: 150 ug/h via INTRAVENOUS
  Filled 2020-11-14 (×5): qty 250

## 2020-11-14 NOTE — Progress Notes (Addendum)
[facility] NEPHROLOGY PROGRESS NOTE  Assessment/ Plan: Pt is a 65 y.o. yo female  with history of hypertension, HLD, type II DM, CHF, ESRD on HD MWF who presented to the [facility] ER with a left knee pain after a fall, seen as a consult for the management of ESRD.  She developed respiratory distress requiring intubation and transferred to [facility] ICU.  [facility]-  orders are as follows  MWF-  4 hours AVF-  16 gauge- 300 BFR 2/2.5 bath/300 rev/EDW 68.5-  no heparin Calcitriol 0.75 TIW, epo 12,000 TIW and venofer 50 weekly   #Comminuted distal left femur fracture after the fall: Seen by orthopedics and plan for operative intervention on 9/12.   # ESRD: MWF at [facility]: The AV fistula clotted therefore right groin HD catheter was placed by general surgeon at [place] on 9/10 and underwent dialysis.  There was issue with the catheter blood flow as well.  No need for dialysis today.  Assess in the morning for IHD versus CRRT.  Noted she is going to the OR tomorrow and currently intubated in ICU.  I will consult IR for AVF declotting and may need TDC placement.   #Hyperkalemia: Improved after HD.   #Hypotension/shock, intradialytic hypotension.  Required midodrine, albumin during HD.  Currently she is on Levophed IV.   # Anemia of ESRD: Probably some blood loss.  Receiving blood transfusion.  No iron because of infection and on antibiotics.  Continue ESA.  # Metabolic Bone Disease: Monitor calcium phosphorus level.  Resume calcitriol, Renvela when able to take orally.  Subjective: Seen and examined ICU.  She is currently intubated, sedated.  She is able to open eyes.  Discussed with ICU team. Objective Vital signs in last 24 hours: Vitals:   11/14/20 0900 11/14/20 0910 11/14/20 0915 11/14/20 0930  BP: (!) 104/43 (!) 112/39 (!) 112/43 (!) 110/38  Pulse: 81 80 83 80  Resp: 14 (!) _0 Temp:  98.7 F (37.1 C)    TempSrc:  Axillary    SpO2: 100% 100% 100% 100%   Weight:      Height:       Weight change: 3.428 kg  Intake/Output Summary (Last 24 hours) at 11/14/2020 0952 Last data filed at 11/14/2020 0900 Gross per 24 hour  Intake 764.49 ml  Output -1162 ml  Net 1926.49 ml       Labs: Basic Metabolic Panel: Recent Labs  Lab 11/07/2020 1015 11/13/20 0548 11/14/20 0746  NA 139 140 132*  K 6.0* 5.9* 3.8  CL 101 102 96*  CO2 19* 18* 17*  GLUCOSE 219* 153* 184*  BUN 91* 92* 27*  CREATININE 12.96* 13.54* 5.81*  CALCIUM 8.1* 7.7* 7.0*  PHOS  --   --  6.4*   Liver Function Tests: Recent Labs  Lab 11/13/2020 1015 11/14/20 0746  AST 27  --   ALT 34  --   ALKPHOS 101  --   BILITOT 0.5  --   PROT 6.6  --   ALBUMIN 2.7* 2.3*   No results for input(s): LIPASE, AMYLASE in the last 168 hours. No results for input(s): AMMONIA in the last 168 hours. CBC: Recent Labs  Lab 11/11/2020 1015 11/13/20 0548 11/14/20 0745  WBC 19.2* 16.6* 32.1*  NEUTROABS 16.1*  --   --   HGB 7.4* 6.4* 7.5*  HCT 25.0* 22.3* 24.5*  MCV 96.5 97.4 94.6  PLT 227 184 199   Cardiac Enzymes: No results for  input(s): CKTOTAL, CKMB, CKMBINDEX, TROPONINI in the last 168 hours. CBG: Recent Labs  Lab 11/13/20 2209 11/13/20 2330 11/14/20 0222 11/14/20 0415 11/14/20 0709  GLUCAP 146* 152* 152* 173* 191*    Iron Studies:  Recent Labs    11/14/20 0745  IRON 19*  TIBC 129*  FERRITIN 1,392*   Studies/Results: DG Abd 1 View  Result Date: 11/13/2020 CLINICAL DATA:  Orogastric tube placement. EXAM: ABDOMEN - 1 VIEW COMPARISON:  Abdomen and pelvis CT dated 08/27/2020 FINDINGS: Orogastric 2 tip in the mid to distal stomach and side hole in the proximal to mid stomach. Normal bowel gas pattern. Cholecystectomy clips. Dense atheromatous arterial calcifications. Mild dextroconvex scoliosis. IMPRESSION: Orogastric tube tip and side hole in the stomach. Electronically Signed   By: Claudie Revering M.D.   On: 11/13/2020 21:30   CT Knee Left Wo Contrast  Result Date:  11/07/2020 CLINICAL DATA:  Knee instability EXAM: CT OF THE left KNEE WITHOUT CONTRAST TECHNIQUE: Multidetector CT imaging of the left knee was performed according to the standard protocol. Multiplanar CT image reconstructions were also generated. COMPARISON:  Radiograph same day FINDINGS: Bones/Joint/Cartilage There is a comminuted distal femur fracture with impaction and intra-articular extension through the intercondylar notch. There is mild posteromedial displacement by 4-6 mm. There is no evidence of proximal tibia, proximal fibular, or patellar fracture. There is a moderate-sized lipohemarthrosis. Ligaments Suboptimally assessed by CT. Muscles and Tendons Generalized loss of muscle bulk with mild muscle atrophy. No intramuscular collection. Soft tissues Extensive soft tissue swelling along the distal femur and knee. Vascular calcifications. IMPRESSION: Comminuted, intra-articular distal femur fracture with impaction and mild posteromedial displacement. Moderate-sized lipohemarthrosis. Electronically Signed   By: Maurine Simmering M.D.   On: 11/13/2020 13:31   CT 3D RECON AT SCANNER  Result Date: 11/24/2020 CLINICAL DATA:  Knee instability EXAM: 3-DIMENSIONAL CT IMAGE RENDERING ON ACQUISITION WORKSTATION TECHNIQUE: 3-dimensional CT images were rendered by post-processing of the original CT data on an acquisition workstation. The 3-dimensional CT images were interpreted and findings were reported in the accompanying complete CT report for this study COMPARISON:  None. FINDINGS: 3D reconstructions of the left knee or performed. Again seen is a comminuted distal femur fracture with intra-articular extension through the intercondylar notch. There is mild posteromedial displacement and mild impaction. IMPRESSION: 3D reconstruction of the left knee demonstrating the comminuted distal femur fracture with intra-articular extension through the intercondylar notch Electronically Signed   By: Maurine Simmering M.D.   On: 11/05/2020  14:06   DG Chest Port 1 View  Result Date: 11/14/2020 CLINICAL DATA:  Respiratory failure with hypoxia EXAM: PORTABLE CHEST 1 VIEW COMPARISON:  Yesterday FINDINGS: Endotracheal tube terminates 5.3 cm above carina. Nasogastric tube extends beyond the inferior aspect of the film. Cholecystectomy clips. Midline trachea. Moderate cardiomegaly. Atherosclerosis in the transverse aorta. No pleural effusion or pneumothorax. Moderate diffuse interstitial thickening. No well-defined lobar consolidation. IMPRESSION: Similar appearance of cardiomegaly and interstitial thickening, likely related to clinical history of heart failure. Aortic Atherosclerosis (ICD10-I70.0). Electronically Signed   By: Abigail Miyamoto M.D.   On: 11/14/2020 08:46   DG Chest Port 1 View  Result Date: 11/13/2020 CLINICAL DATA:  Evaluate for endotracheal tube placement. EXAM: PORTABLE CHEST 1 VIEW COMPARISON:  March 29, 2020 FINDINGS: Endotracheal tube is identified with distal tip 4.8 cm from carina. There is no pneumothorax. The lungs are clear. The mediastinal contour and cardiac silhouette are normal. The osseous structures are stable. IMPRESSION: Endotracheal tube identified in good position.  No pneumothorax.  Electronically Signed   By: Abelardo Diesel M.D.   On: 11/13/2020 13:19    Medications: Infusions:  sodium chloride     fentaNYL infusion INTRAVENOUS 25 mcg/hr (11/14/20 0900)   norepinephrine (LEVOPHED) Adult infusion 8 mcg/min (11/14/20 0900)   piperacillin-tazobactam (ZOSYN)  IV Stopped (11/14/20 0704)    Scheduled Medications:  sodium chloride   Intravenous Once   amiodarone  100 mg Oral Daily   ARIPiprazole  5 mg Oral QHS   aspirin EC  81 mg Oral Q breakfast   calcitRIOL  0.75 mcg Oral Q T,Th,Sat-1800   calcium acetate  667 mg Oral TID WC   chlorhexidine gluconate (MEDLINE KIT)  15 mL Mouth Rinse BID   Chlorhexidine Gluconate Cloth  6 each Topical Q0600   darbepoetin (ARANESP) injection - DIALYSIS  40 mcg  Intravenous Once   docusate  100 mg Per Tube BID   insulin aspart  0-6 Units Subcutaneous Q4H   mouth rinse  15 mL Mouth Rinse 10 times per day   metoCLOPramide  5 mg Oral TID AC   multivitamin  1 tablet Oral Daily   pantoprazole (PROTONIX) IV  40 mg Intravenous Q24H   polyethylene glycol  17 g Per Tube Daily   predniSONE  5 mg Oral Q breakfast   senna  1 tablet Oral BID   sertraline  100 mg Oral QHS   sevelamer carbonate  3,200 mg Oral TID with meals   sodium chloride flush  10-40 mL Intracatheter Q12H   sodium chloride flush  3 mL Intravenous Q12H    have reviewed scheduled and prn medications.  Physical Exam: General: Ill-looking female intubated, sedated. Heart:RRR, s1s2 nl Lungs: Coarse breath sound bilateral Abdomen:soft,  non-distended Extremities:No edema Dialysis Access: Right groin catheter in place, left upper extremity AV fistula has no bruit or thrill.   Reesa Chew  11/14/2020,9:52 AM  LOS: 2 days

## 2020-11-14 NOTE — Progress Notes (Signed)
Attempted to obtain Sputum culture, not able to get any secretions at this time.

## 2020-11-14 NOTE — Consult Note (Signed)
Chief Complaint: Patient was seen in consultation today for left arm dialysis fistula evaluation and possible intervention.  Possible tunneled dialysis catheter placement Chief Complaint  Patient presents with   Fall   at the request of Dr Carolin Sicks  Supervising Physician: Jacqulynn Cadet  Patient Status: [facility] - In-pt  History of Present Illness: [name] is a 65 y.o. female   ESRD Left arm fistula clotted---This fistula placed in 2018 Last use Friday 9/9 Did not complete dialysis per husband at bedside Had an intervention on this AV fistula with CV Vasc 5-6 weeks ago  Pt is in hospital on vent [place] 9/9 at home--- for femur surgery 11/09/2020  Needed dialysis as IP -- but clotted graft Temp cath was placed in Rt groin 11/13/20--Dr Arnoldo Morale Was transferred to [facility] secondary Resp distress and intubation  Now request for left arm dialysis fistula evaluation and intervention.  Planned for IR procedure 9/13--- since pt is for OR tomorrow with leg fx   Past Medical History:  Diagnosis Date   Abnormal CXR (chest x-ray) 05/25/2019   Abnormal LFTs (liver function tests) 03/11/2018   Acute hypoxemic respiratory failure ([place]) 12/16/2018   Acute on chronic combined systolic and diastolic CHF (congestive heart failure) -EF 40 to 45 % 08/10/2019   Acute respiratory failure with hypoxia (HCC) 12/02/2018   Anemia    of chronic disease   Blood transfusion without reported diagnosis    Cardiovascular disease    nonobstructive   Carotid artery stenosis 2008   CHF (congestive heart failure) ([place])    Coronary artery disease    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   Last Assessment & Plan:  - Continuing home suppression therapy: Bactrim M-W-F, Prednisone 5 mg daily, Myfortic 360 mg BID and Prograf 1 mg BID - Appreciate Nephrology recs   Diabetes mellitus    Dyslipidemia    Edema of lower extremity    with hypo-albuminemia and profound protenuria   HCAP  (healthcare-associated pneumonia)    Heart murmur    Hypertension    Lung collapse 12/15/2018   Mitral regurgitation    Nephrotic syndrome    Patent foramen ovale    Pulmonary edema 08/10/2019   Pulmonary hypertension, moderate to severe (HCC)    Pulmonary nodule    Renal insufficiency    Tricuspid regurgitation    Volume depletion, renal, due to output loss (renal deficit)     Past Surgical History:  Procedure Laterality Date   A/V FISTULAGRAM N/A 04/05/2017   Procedure: A/V FISTULAGRAM - Left Arm;  Surgeon: Angelia Mould, MD;  Location: [place] [facility];  Service: Cardiovascular;  Laterality: N/A;   A/V FISTULAGRAM Left 09/18/2017   Procedure: A/V FISTULAGRAM;  Surgeon: Serafina Mitchell, MD;  Location: [place] [facility];  Service: Cardiovascular;  Laterality: Left;   A/V SHUNT INTERVENTION Left 04/05/2017   Procedure: A/V SHUNT INTERVENTION;  Surgeon: Angelia Mould, MD;  Location: [place] [facility];  Service: Cardiovascular;  Laterality: Left;   CARDIAC CATHETERIZATION  2008   IR REMOVAL TUN CV CATH W/O FL  11/02/2016   KIDNEY TRANSPLANT Right 02/23/2009   PERIPHERAL VASCULAR BALLOON ANGIOPLASTY Left 09/18/2017   Procedure: PERIPHERAL VASCULAR BALLOON ANGIOPLASTY;  Surgeon: Serafina Mitchell, MD;  Location: [place] [facility];  Service: Cardiovascular;  Laterality: Left;  Arm Fistula    Allergies: Other, Tape, and Renvela [sevelamer]  Medications: Prior to Admission medications   Medication Sig Start Date End Date Taking? Authorizing Provider  acetaminophen (TYLENOL) 500 MG tablet Take 500 mg by mouth every 6 (six) hours as needed for moderate pain or headache.  04/06/16  Yes [provider]  amiodarone (PACERONE) 100 MG tablet Take 100 mg by mouth daily. 07/09/20  Yes [provider]  amLODipine (NORVASC) 5 MG tablet Take 5 mg by mouth daily. 06/26/20  Yes [provider]  apixaban (ELIQUIS) 2.5 MG TABS tablet Take 1 tablet (2.5 mg total)  by mouth 2 (two) times daily. 07/08/20  Yes Stacks, Cletus Gash, MD  B Complex-C-Folic Acid (RENA-VITE RX) 1 MG TABS Take 1 tablet by mouth daily. 09/24/18  Yes [provider]  calcium acetate (PHOSLO) 667 MG capsule Take by mouth 3 (three) times daily with meals.   Yes [provider]  clopidogrel (PLAVIX) 75 MG tablet Take 75 mg by mouth daily. 05/07/20  Yes [provider]  Dulaglutide (TRULICITY) 1.5 0000000 SOPN INJECT 1.'5MG'$   SUBCUTANEOUSLY ONCE A WEEK. Patient taking differently: Inject 1.5 mg into the skin every Saturday. 10/19/20  Yes Stacks, Cletus Gash, MD  insulin degludec (TRESIBA FLEXTOUCH) 100 UNIT/ML FlexTouch Pen Inject 15 Units into the skin daily. Patient taking differently: Inject 10 Units into the skin at bedtime. 04/27/20  Yes Claretta Fraise, MD  Lidocaine 4 % PTCH Place 1 Package onto the skin daily as needed (for pain (knee pain)). 07/16/20  Yes [provider]  mirtazapine (REMERON) 15 MG tablet Take 1 tablet (15 mg total) by mouth at bedtime. For sleep 10/19/20  Yes Stacks, Cletus Gash, MD  nitroGLYCERIN (NITROSTAT) 0.4 MG SL tablet Place 0.4 mg under the tongue every 5 (five) minutes as needed for chest pain. 05/18/20  Yes [provider]  ondansetron (ZOFRAN-ODT) 4 MG disintegrating tablet Take 4 mg by mouth every 8 (eight) hours as needed for vomiting or nausea. 07/06/20  Yes [provider]  pantoprazole (PROTONIX) 40 MG tablet Take 40 mg by mouth daily. 06/27/20  Yes [provider]  predniSONE (DELTASONE) 5 MG tablet Take 1 tablet (5 mg total) by mouth daily with breakfast. Patient taking differently: Take 5 mg by mouth at bedtime. 07/22/19  Yes Barton Dubois, MD  rosuvastatin (CRESTOR) 20 MG tablet Take 1 tablet (20 mg total) by mouth daily. 10/19/20  Yes Claretta Fraise, MD  sertraline (ZOLOFT) 100 MG tablet Take 1 tablet (100 mg total) by mouth at bedtime. For depression 10/19/20  Yes Claretta Fraise, MD     Family History   Problem Relation Age of Onset   Kidney disease Mother    Diabetes Mother    Diabetes Father    Heart disease Father     Social History   Socioeconomic History   Marital status: Married    Spouse name: Not on file   Number of children: Not on file   Years of education: Not on file   Highest education level: Not on file  Occupational History   Not on file  Tobacco Use   Smoking status: Never   Smokeless tobacco: Never  Vaping Use   Vaping Use: Never used  Substance and Sexual Activity   Alcohol use: No   Drug use: No   Sexual activity: Never  Other Topics Concern   Not on file  Social History Narrative   Not on file   Social Determinants of Health   Financial Resource Strain: Not on file  Food Insecurity: Not on file  Transportation Needs: Not on file  Physical Activity: Not on file  Stress: Not on file  Social Connections: Not on file    Review of Systems: A 12 point ROS discussed and pertinent positives are indicated in the HPI above.  All other systems are negative.    Vital Signs: BP (!) 135/42   Pulse 83   Temp 98 F (36.7 C) (Axillary)   Resp 16   Ht '5\' 5"'$  (1.651 m)   Wt 153 lb 14.1 oz (69.8 kg)   SpO2 100%   BMI 25.61 kg/m   Physical Exam Cardiovascular:     Rate and Rhythm: Regular rhythm.  Pulmonary:     Comments: vent Abdominal:     Palpations: Abdomen is soft.     Tenderness: There is no abdominal tenderness.  Musculoskeletal:     Comments: Left arm dialysis fistula No bruit No pulses   Skin:    General: Skin is warm.  Psychiatric:     Comments: Opens eyes to me; in NAD  Husband at bedside He is aware of declot and possible tunneled dialysis catheter placement Has consented to procedure    Imaging: DG Abd 1 View  Result Date: 11/13/2020 CLINICAL DATA:  Orogastric tube placement. EXAM: ABDOMEN - 1 VIEW COMPARISON:  Abdomen and pelvis CT dated 08/27/2020 FINDINGS: Orogastric 2 tip in the mid to distal stomach and side hole in  the proximal to mid stomach. Normal bowel gas pattern. Cholecystectomy clips. Dense atheromatous arterial calcifications. Mild dextroconvex scoliosis. IMPRESSION: Orogastric tube tip and side hole in the stomach. Electronically Signed   By: Claudie Revering M.D.   On: 11/13/2020 21:30   CT Knee Left Wo Contrast  Result Date: 11/14/2020 CLINICAL DATA:  Knee instability EXAM: CT OF THE left KNEE WITHOUT CONTRAST TECHNIQUE: Multidetector CT imaging of the left knee was performed according to the standard protocol. Multiplanar CT image reconstructions were also generated. COMPARISON:  Radiograph same day FINDINGS: Bones/Joint/Cartilage There is a comminuted distal femur fracture with impaction and intra-articular extension through the intercondylar notch. There is mild posteromedial displacement by 4-6 mm. There is no evidence of proximal tibia, proximal fibular, or patellar fracture. There is a moderate-sized lipohemarthrosis. Ligaments Suboptimally assessed by CT. Muscles and Tendons Generalized loss of muscle bulk with mild muscle atrophy. No intramuscular collection. Soft tissues Extensive soft tissue swelling along the distal femur and knee. Vascular calcifications. IMPRESSION: Comminuted, intra-articular distal femur fracture with impaction and mild posteromedial displacement. Moderate-sized lipohemarthrosis. Electronically Signed   By: Maurine Simmering M.D.   On: 11/18/2020 13:31   CT 3D RECON AT SCANNER  Result Date: 11/10/2020 CLINICAL DATA:  Knee instability EXAM: 3-DIMENSIONAL CT IMAGE RENDERING ON ACQUISITION WORKSTATION TECHNIQUE: 3-dimensional CT images were rendered by post-processing of the original CT data on an acquisition workstation. The 3-dimensional CT images were interpreted and findings were reported in the accompanying complete CT report for this study COMPARISON:  None. FINDINGS: 3D reconstructions of the left knee or performed. Again seen is a comminuted distal femur fracture with intra-articular  extension through the intercondylar notch. There is mild posteromedial displacement and mild impaction. IMPRESSION: 3D reconstruction of the left knee demonstrating the comminuted distal femur fracture with intra-articular extension through the intercondylar notch Electronically Signed   By: Maurine Simmering M.D.   On: 11/25/2020 14:06   DG Chest Port 1 View  Result Date: 11/14/2020 CLINICAL DATA:  Respiratory failure with hypoxia EXAM: PORTABLE CHEST 1 VIEW COMPARISON:  Yesterday FINDINGS: Endotracheal tube terminates 5.3 cm above carina. Nasogastric tube extends beyond the inferior aspect of the film. Cholecystectomy clips. Midline trachea.  Moderate cardiomegaly. Atherosclerosis in the transverse aorta. No pleural effusion or pneumothorax. Moderate diffuse interstitial thickening. No well-defined lobar consolidation. IMPRESSION: Similar appearance of cardiomegaly and interstitial thickening, likely related to clinical history of heart failure. Aortic Atherosclerosis (ICD10-I70.0). Electronically Signed   By: Abigail Miyamoto M.D.   On: 11/14/2020 08:46   DG Chest Port 1 View  Result Date: 11/13/2020 CLINICAL DATA:  Evaluate for endotracheal tube placement. EXAM: PORTABLE CHEST 1 VIEW COMPARISON:  March 29, 2020 FINDINGS: Endotracheal tube is identified with distal tip 4.8 cm from carina. There is no pneumothorax. The lungs are clear. The mediastinal contour and cardiac silhouette are normal. The osseous structures are stable. IMPRESSION: Endotracheal tube identified in good position.  No pneumothorax. Electronically Signed   By: Abelardo Diesel M.D.   On: 11/13/2020 13:19   DG Knee Complete 4 Views Left  Result Date: 11/04/2020 CLINICAL DATA:  [place] yesterday. Pain and swelling. EXAM: LEFT KNEE - COMPLETE 4+ VIEW COMPARISON:  None. FINDINGS: Comminuted but minimally displaced fracture involving the distal femur. Intra-articular involvement with large hemarthrosis. The tibia and fibula are intact. Extensive age  advanced vascular calcifications. IMPRESSION: Comminuted distal femur fracture with intra-articular involvement. Electronically Signed   By: Marijo Sanes M.D.   On: 11/10/2020 09:57    Labs:  CBC: Recent Labs    07/26/20 1609 11/11/2020 1015 11/13/20 0548 11/14/20 0745  WBC 10.1 19.2* 16.6* 32.1*  HGB 9.1* 7.4* 6.4* 7.5*  HCT 29.9* 25.0* 22.3* 24.5*  PLT 178 227 184 199    COAGS: No results for input(s): INR, APTT in the last 8760 hours.  BMP: Recent Labs    02/19/20 1415 07/26/20 1609 11/25/2020 1015 11/13/20 0548 11/14/20 0746  NA 143 137 139 140 132*  K 4.8 4.5 6.0* 5.9* 3.8  CL 99 103 101 102 96*  CO2 19* 27 19* 18* 17*  GLUCOSE 162* 97 219* 153* 184*  BUN 28* 46* 91* 92* 27*  CALCIUM 8.7 7.9* 8.1* 7.7* 7.0*  CREATININE 7.32* 9.37* 12.96* 13.54* 5.81*  GFRNONAA 5* 4* 3* 3* 8*  GFRAA 6*  --   --   --   --     LIVER FUNCTION TESTS: Recent Labs    01/05/20 0906 01/07/20 1414 02/19/20 1415 07/26/20 1609 11/10/2020 1015 11/14/20 0746  BILITOT 0.5  --  0.3 0.5 0.5  --   AST 20  --  '29 24 27  '$ --   ALT 26  --  19 24 34  --   ALKPHOS 90  --  112 80 101  --   PROT 7.4  --  6.8 4.7* 6.6  --   ALBUMIN 3.2*   < > 3.3* 2.0* 2.7* 2.3*   < > = values in this interval not displayed.    TUMOR MARKERS: No results for input(s): AFPTM, CEA, CA199, CHROMGRNA in the last 8760 hours.  Assessment and Plan:  ESRD Clotted left arm dialysis fistula Scheduled for IR evaluation and possible intervention with possible tunneled dialysis catheter placement 9/13 Pt is scheduled for OR- leg surgery 9/12  Risks and benefits discussed with the patient's husband at bedside including, but not limited to bleeding, infection, vascular injury, pulmonary embolism, need for tunneled HD catheter placement or even death.  All questions were answered, husband is agreeable to proceed. Consent signed and in chart.     Thank you for this interesting consult.  I greatly enjoyed meeting [name] and look forward to participating in their care.  A copy of this report was sent to the requesting provider on this date.  Electronically Signed: Lavonia Drafts, PA-C 11/14/2020, 1:01 PM   I spent a total of 20 Minutes    in face to face in clinical consultation, greater than 50% of which was counseling/coordinating care for left arm fistula declot

## 2020-11-14 NOTE — Progress Notes (Signed)
eLink Physician-Brief Progress Note Patient Name: [name] DOB: December 10, 1955 MRN: [id number]   Date of Service  11/14/2020  HPI/Events of Note  Patient with brief but recurrent transient lapse into atrial fibrillation alternating with normal sinus rhythm, she has a history of atrial fibrillation, recent dialysis and last K+ 3.8, Mg++ 1.7 this a.m. with no evidence it was replaced in the order history, stat labs are being drawn, including BMP, Mg++ and CBC (she was recently anemic and required transfusion of PRBC).  eICU Interventions  Will order a gram of magnesium while awaiting lab results.        Kerry Kass Ellesse Antenucci 11/14/2020, 9:32 PM

## 2020-11-14 NOTE — Progress Notes (Signed)
Date and time results received: 11/14/20 5:48 AM  (use smartphrase ".now" to insert current time)  Test: Lactic Acid Critical Value: 2.3  Name of Provider Notified: E-link  Orders Received? Or Actions Taken?: Orders Received - See Orders for details

## 2020-11-14 NOTE — Progress Notes (Signed)
eLink Physician-Brief Progress Note Patient Name: [name] DOB: [birth year] MRN: [id number]   Date of Service  11/14/2020  HPI/Events of Note  65 yr old female with ESRD, Afib, sys and diastolic CHF admitted to [facility] after fall suffering leg fx.  Course complicated by Resp failure requiring intubation.  Transferred to [facility] for assistance with eval management prior to surgical repair.  eICU Interventions  Chart reviewed      Intervention Category Evaluation Type: New Patient Evaluation  Mauri Brooklyn, P 11/14/2020, 3:58 AM

## 2020-11-15 ENCOUNTER — Encounter (HOSPITAL_COMMUNITY): Admission: EM | Disposition: E | Payer: Self-pay | Source: Home / Self Care | Attending: Internal Medicine

## 2020-11-15 ENCOUNTER — Inpatient Hospital Stay (HOSPITAL_COMMUNITY): Payer: Medicare HMO | Admitting: Anesthesiology

## 2020-11-15 ENCOUNTER — Inpatient Hospital Stay (HOSPITAL_COMMUNITY): Payer: Medicare HMO

## 2020-11-15 DIAGNOSIS — R579 Shock, unspecified: Secondary | ICD-10-CM | POA: Diagnosis not present

## 2020-11-15 HISTORY — PX: ORIF FEMUR FRACTURE: SHX2119

## 2020-11-15 LAB — MAGNESIUM: Magnesium: 2 mg/dL (ref 1.7–2.4)

## 2020-11-15 LAB — CBC
HCT: 24.1 % — ABNORMAL LOW (ref 36.0–46.0)
Hemoglobin: 7.5 g/dL — ABNORMAL LOW (ref 12.0–15.0)
MCH: 28.6 pg (ref 26.0–34.0)
MCHC: 31.1 g/dL (ref 30.0–36.0)
MCV: 92 fL (ref 80.0–100.0)
Platelets: 198 10*3/uL (ref 150–400)
RBC: 2.62 MIL/uL — ABNORMAL LOW (ref 3.87–5.11)
RDW: 17.6 % — ABNORMAL HIGH (ref 11.5–15.5)
WBC: 31.1 10*3/uL — ABNORMAL HIGH (ref 4.0–10.5)
nRBC: 0.1 % (ref 0.0–0.2)

## 2020-11-15 LAB — GLUCOSE, CAPILLARY
Glucose-Capillary: 138 mg/dL — ABNORMAL HIGH (ref 70–99)
Glucose-Capillary: 163 mg/dL — ABNORMAL HIGH (ref 70–99)
Glucose-Capillary: 167 mg/dL — ABNORMAL HIGH (ref 70–99)
Glucose-Capillary: 188 mg/dL — ABNORMAL HIGH (ref 70–99)
Glucose-Capillary: 221 mg/dL — ABNORMAL HIGH (ref 70–99)
Glucose-Capillary: 250 mg/dL — ABNORMAL HIGH (ref 70–99)

## 2020-11-15 LAB — BASIC METABOLIC PANEL
Anion gap: 17 — ABNORMAL HIGH (ref 5–15)
BUN: 46 mg/dL — ABNORMAL HIGH (ref 8–23)
CO2: 18 mmol/L — ABNORMAL LOW (ref 22–32)
Calcium: 6.9 mg/dL — ABNORMAL LOW (ref 8.9–10.3)
Chloride: 93 mmol/L — ABNORMAL LOW (ref 98–111)
Creatinine, Ser: 7.98 mg/dL — ABNORMAL HIGH (ref 0.44–1.00)
GFR, Estimated: 5 mL/min — ABNORMAL LOW (ref >60)
Glucose, Bld: 265 mg/dL — ABNORMAL HIGH (ref 70–99)
Potassium: 4.1 mmol/L (ref 3.5–5.1)
Sodium: 128 mmol/L — ABNORMAL LOW (ref 135–145)

## 2020-11-15 LAB — PROTIME-INR
INR: 1.3 — ABNORMAL HIGH (ref 0.8–1.2)
Prothrombin Time: 16.6 seconds — ABNORMAL HIGH (ref 11.4–15.2)

## 2020-11-15 LAB — PHOSPHORUS: Phosphorus: 9.5 mg/dL — ABNORMAL HIGH (ref 2.5–4.6)

## 2020-11-15 LAB — APTT: aPTT: 37 seconds — ABNORMAL HIGH (ref 24–36)

## 2020-11-15 SURGERY — OPEN REDUCTION INTERNAL FIXATION (ORIF) DISTAL FEMUR FRACTURE
Anesthesia: General | Laterality: Left

## 2020-11-15 MED ORDER — 0.9 % SODIUM CHLORIDE (POUR BTL) OPTIME
TOPICAL | Status: DC | PRN
  Administered 2020-11-15: 1000 mL

## 2020-11-15 MED ORDER — SENNA 8.6 MG PO TABS
1.0000 | ORAL_TABLET | Freq: Two times a day (BID) | ORAL | Status: DC
  Administered 2020-11-15 – 2020-11-19 (×6): 8.6 mg
  Filled 2020-11-15 (×5): qty 1

## 2020-11-15 MED ORDER — AMIODARONE HCL 200 MG PO TABS
100.0000 mg | ORAL_TABLET | Freq: Every day | ORAL | Status: DC
  Administered 2020-11-16 – 2020-11-19 (×4): 100 mg
  Filled 2020-11-15 (×4): qty 1

## 2020-11-15 MED ORDER — SERTRALINE HCL 100 MG PO TABS
100.0000 mg | ORAL_TABLET | Freq: Every day | ORAL | Status: DC
  Administered 2020-11-15 – 2020-11-17 (×3): 100 mg
  Filled 2020-11-15 (×3): qty 1

## 2020-11-15 MED ORDER — FENTANYL CITRATE (PF) 250 MCG/5ML IJ SOLN
INTRAMUSCULAR | Status: AC
  Filled 2020-11-15: qty 5

## 2020-11-15 MED ORDER — FENTANYL CITRATE (PF) 250 MCG/5ML IJ SOLN
INTRAMUSCULAR | Status: DC | PRN
  Administered 2020-11-15: 100 ug via INTRAVENOUS

## 2020-11-15 MED ORDER — ASPIRIN 81 MG PO CHEW
81.0000 mg | CHEWABLE_TABLET | Freq: Every day | ORAL | Status: DC
  Administered 2020-11-16 – 2020-11-19 (×4): 81 mg
  Filled 2020-11-15 (×4): qty 1

## 2020-11-15 MED ORDER — VANCOMYCIN HCL 1000 MG IV SOLR
INTRAVENOUS | Status: DC | PRN
  Administered 2020-11-15: 1000 mg

## 2020-11-15 MED ORDER — SEVELAMER CARBONATE 800 MG PO TABS
3200.0000 mg | ORAL_TABLET | Freq: Three times a day (TID) | ORAL | Status: DC
  Administered 2020-11-16 – 2020-11-18 (×3): 3200 mg
  Filled 2020-11-15 (×2): qty 4

## 2020-11-15 MED ORDER — ARIPIPRAZOLE 5 MG PO TABS
5.0000 mg | ORAL_TABLET | Freq: Every day | ORAL | Status: DC
  Administered 2020-11-15 – 2020-11-16 (×2): 5 mg
  Filled 2020-11-15 (×2): qty 1

## 2020-11-15 MED ORDER — CALCITRIOL 0.25 MCG PO CAPS
0.7500 ug | ORAL_CAPSULE | ORAL | Status: DC
  Filled 2020-11-15: qty 3

## 2020-11-15 MED ORDER — PHENYLEPHRINE 40 MCG/ML (10ML) SYRINGE FOR IV PUSH (FOR BLOOD PRESSURE SUPPORT)
PREFILLED_SYRINGE | INTRAVENOUS | Status: DC | PRN
  Administered 2020-11-15 (×4): 80 ug via INTRAVENOUS

## 2020-11-15 MED ORDER — PREDNISONE 10 MG PO TABS
5.0000 mg | ORAL_TABLET | Freq: Every day | ORAL | Status: DC
  Administered 2020-11-16 – 2020-11-19 (×4): 5 mg
  Filled 2020-11-15 (×4): qty 1

## 2020-11-15 MED ORDER — MIDAZOLAM HCL 2 MG/2ML IJ SOLN
INTRAMUSCULAR | Status: DC | PRN
  Administered 2020-11-15: 2 mg via INTRAVENOUS

## 2020-11-15 MED ORDER — ROCURONIUM BROMIDE 10 MG/ML (PF) SYRINGE
PREFILLED_SYRINGE | INTRAVENOUS | Status: DC | PRN
  Administered 2020-11-15: 50 mg via INTRAVENOUS

## 2020-11-15 MED ORDER — SODIUM CHLORIDE 0.9 % IV SOLN: INTRAVENOUS | Status: DC | PRN

## 2020-11-15 MED ORDER — METOCLOPRAMIDE HCL 5 MG PO TABS
5.0000 mg | ORAL_TABLET | Freq: Three times a day (TID) | ORAL | Status: DC
  Administered 2020-11-16 – 2020-11-17 (×6): 5 mg
  Filled 2020-11-15 (×9): qty 1

## 2020-11-15 MED ORDER — PROSOURCE TF PO LIQD
45.0000 mL | Freq: Every day | ORAL | Status: DC
  Administered 2020-11-15 – 2020-11-18 (×4): 45 mL
  Filled 2020-11-15 (×3): qty 45

## 2020-11-15 MED ORDER — RENA-VITE PO TABS
1.0000 | ORAL_TABLET | Freq: Every day | ORAL | Status: DC
  Administered 2020-11-16 – 2020-11-18 (×3): 1
  Filled 2020-11-15 (×2): qty 1

## 2020-11-15 MED ORDER — "THROMBI-PAD 3""X3"" EX PADS"
2.0000 | MEDICATED_PAD | Freq: Once | CUTANEOUS | Status: DC
  Filled 2020-11-15: qty 2

## 2020-11-15 MED ORDER — VANCOMYCIN HCL 1000 MG IV SOLR
INTRAVENOUS | Status: AC
  Filled 2020-11-15: qty 20

## 2020-11-15 MED ORDER — MIDAZOLAM HCL 2 MG/2ML IJ SOLN
INTRAMUSCULAR | Status: AC
  Filled 2020-11-15: qty 2

## 2020-11-15 MED ORDER — VITAL 1.5 CAL PO LIQD
1000.0000 mL | ORAL | Status: DC
  Administered 2020-11-15 – 2020-11-17 (×2): 1000 mL
  Filled 2020-11-15: qty 1000

## 2020-11-15 SURGICAL SUPPLY — 72 items
BAG COUNTER SPONGE SURGICOUNT (BAG) ×2 IMPLANT
BIT DRILL 4.3 (BIT) ×2
BIT DRILL 4.3X300MM (BIT) IMPLANT
BIT DRILL LONG 3.3 (BIT) ×1 IMPLANT
BIT DRILL QC 3.3X195 (BIT) ×1 IMPLANT
BLADE CLIPPER SURG (BLADE) IMPLANT
BNDG COHESIVE 6X5 TAN STRL LF (GAUZE/BANDAGES/DRESSINGS) ×1 IMPLANT
BNDG ELASTIC 4X5.8 VLCR STR LF (GAUZE/BANDAGES/DRESSINGS) ×1 IMPLANT
BNDG ELASTIC 6X10 VLCR STRL LF (GAUZE/BANDAGES/DRESSINGS) ×1 IMPLANT
BNDG ELASTIC 6X5.8 VLCR STR LF (GAUZE/BANDAGES/DRESSINGS) ×1 IMPLANT
BRUSH SCRUB EZ PLAIN DRY (MISCELLANEOUS) ×3 IMPLANT
CANISTER SUCT 3000ML PPV (MISCELLANEOUS) ×1 IMPLANT
CAP LOCK NCB (Cap) ×8 IMPLANT
CHLORAPREP W/TINT 26 (MISCELLANEOUS) ×3 IMPLANT
COVER SURGICAL LIGHT HANDLE (MISCELLANEOUS) ×2 IMPLANT
DRAPE C-ARM 42X72 X-RAY (DRAPES) ×2 IMPLANT
DRAPE C-ARMOR (DRAPES) ×2 IMPLANT
DRAPE HALF SHEET 40X57 (DRAPES) ×2 IMPLANT
DRAPE ORTHO SPLIT 77X108 STRL (DRAPES) ×4
DRAPE SURG 17X23 STRL (DRAPES) ×2 IMPLANT
DRAPE SURG ORHT 6 SPLT 77X108 (DRAPES) ×2 IMPLANT
DRAPE U-SHAPE 47X51 STRL (DRAPES) ×2 IMPLANT
DRESSING MEPILEX FLEX 4X4 (GAUZE/BANDAGES/DRESSINGS) IMPLANT
DRSG ADAPTIC 3X8 NADH LF (GAUZE/BANDAGES/DRESSINGS) IMPLANT
DRSG MEPILEX BORDER 4X12 (GAUZE/BANDAGES/DRESSINGS) IMPLANT
DRSG MEPILEX BORDER 4X4 (GAUZE/BANDAGES/DRESSINGS) IMPLANT
DRSG MEPILEX BORDER 4X8 (GAUZE/BANDAGES/DRESSINGS) ×2 IMPLANT
DRSG MEPILEX FLEX 4X4 (GAUZE/BANDAGES/DRESSINGS) ×2
DRSG PAD ABDOMINAL 8X10 ST (GAUZE/BANDAGES/DRESSINGS) ×3 IMPLANT
ELECT REM PT RETURN 9FT ADLT (ELECTROSURGICAL) ×2
ELECTRODE REM PT RTRN 9FT ADLT (ELECTROSURGICAL) ×1 IMPLANT
GAUZE SPONGE 4X4 12PLY STRL (GAUZE/BANDAGES/DRESSINGS) ×2 IMPLANT
GLOVE SURG ENC MOIS LTX SZ6.5 (GLOVE) ×6 IMPLANT
GLOVE SURG ENC MOIS LTX SZ7.5 (GLOVE) ×8 IMPLANT
GLOVE SURG UNDER POLY LF SZ6.5 (GLOVE) ×2 IMPLANT
GLOVE SURG UNDER POLY LF SZ7.5 (GLOVE) ×2 IMPLANT
GOWN STRL REUS W/ TWL LRG LVL3 (GOWN DISPOSABLE) ×3 IMPLANT
GOWN STRL REUS W/TWL LRG LVL3 (GOWN DISPOSABLE) ×6
K-WIRE 2.0 (WIRE) ×6
K-WIRE FXSTD 280X2XNS SS (WIRE) ×3
KIT BASIN OR (CUSTOM PROCEDURE TRAY) ×2 IMPLANT
KIT TURNOVER KIT B (KITS) ×2 IMPLANT
KWIRE FXSTD 280X2XNS SS (WIRE) IMPLANT
NS IRRIG 1000ML POUR BTL (IV SOLUTION) ×2 IMPLANT
PACK TOTAL JOINT (CUSTOM PROCEDURE TRAY) ×2 IMPLANT
PAD ARMBOARD 7.5X6 YLW CONV (MISCELLANEOUS) ×4 IMPLANT
PAD CAST 4YDX4 CTTN HI CHSV (CAST SUPPLIES) ×1 IMPLANT
PADDING CAST COTTON 4X4 STRL (CAST SUPPLIES) ×2
PADDING CAST COTTON 6X4 STRL (CAST SUPPLIES) ×2 IMPLANT
PLATE DIST FEM 12H (Plate) ×1 IMPLANT
SCREW 5.0 70MM (Screw) ×1 IMPLANT
SCREW 5.0 80MM (Screw) ×2 IMPLANT
SCREW CORTICAL NCB 5.0X65 (Screw) ×1 IMPLANT
SCREW NCB 3.5X75X5X6.2XST (Screw) IMPLANT
SCREW NCB 4.0X36MM (Screw) ×1 IMPLANT
SCREW NCB 5.0X36MM (Screw) ×1 IMPLANT
SCREW NCB 5.0X38 (Screw) ×1 IMPLANT
SCREW NCB 5.0X75MM (Screw) ×4 IMPLANT
SPONGE T-LAP 18X18 ~~LOC~~+RFID (SPONGE) IMPLANT
STAPLER VISISTAT 35W (STAPLE) ×2 IMPLANT
SUCTION FRAZIER HANDLE 10FR (MISCELLANEOUS) ×2
SUCTION TUBE FRAZIER 10FR DISP (MISCELLANEOUS) ×1 IMPLANT
SUT ETHILON 3 0 PS 1 (SUTURE) ×4 IMPLANT
SUT VIC AB 0 CT1 27 (SUTURE) ×2
SUT VIC AB 0 CT1 27XBRD ANBCTR (SUTURE) IMPLANT
SUT VIC AB 1 CT1 27 (SUTURE)
SUT VIC AB 1 CT1 27XBRD ANBCTR (SUTURE) IMPLANT
SUT VIC AB 2-0 CT1 27 (SUTURE) ×2
SUT VIC AB 2-0 CT1 TAPERPNT 27 (SUTURE) ×2 IMPLANT
TOWEL GREEN STERILE (TOWEL DISPOSABLE) ×4 IMPLANT
TRAY FOLEY MTR SLVR 16FR STAT (SET/KITS/TRAYS/PACK) IMPLANT
WATER STERILE IRR 1000ML POUR (IV SOLUTION) ×3 IMPLANT

## 2020-11-15 NOTE — Anesthesia Postprocedure Evaluation (Signed)
Anesthesia Post Note  Patient: [name]  Procedure(s) Performed: OPEN REDUCTION INTERNAL FIXATION (ORIF) DISTAL FEMUR FRACTURE (Left)     Patient location during evaluation: ICU Anesthesia Type: General Level of consciousness: sedated Pain management: pain level controlled Vital Signs Assessment: post-procedure vital signs reviewed and stable Respiratory status: patient remains intubated per anesthesia plan Cardiovascular status: stable Postop Assessment: no apparent nausea or vomiting Anesthetic complications: no   No notable events documented.  Last Vitals:  Vitals:   11/17/2020 1830 11/17/2020 1928  BP: (!) 103/44   Pulse: 79   Resp: 14   Temp:    SpO2: 100% 100%    Last Pain:  Vitals:   11/26/2020 0730  TempSrc: Axillary  PainSc:                  Karyl Kinnier Jakhai Fant

## 2020-11-15 NOTE — Progress Notes (Signed)
Initial Nutrition Assessment  DOCUMENTATION CODES:   Non-severe (moderate) malnutrition in context of chronic illness  INTERVENTION:   Once pt returns from OR, initiate tube feeds: - Vital 1.5 @ 50 ml/hr (1200 ml/day) - ProSource TF 45 ml daily  Tube feeding regimen provides 1840 kcal, 92 grams of protein, and 917 ml of H2O.   - Continue renal MVI daily per tube  NUTRITION DIAGNOSIS:   Moderate Malnutrition related to chronic illness (ESRD, CHF) as evidenced by mild fat depletion, moderate muscle depletion, percent weight loss (17.8% weight loss in less than 5 months).  GOAL:   Patient will meet greater than or equal to 90% of their needs  MONITOR:   Vent status, Labs, Weight trends, TF tolerance, Skin, I & O's  REASON FOR ASSESSMENT:   Ventilator, Consult Enteral/tube feeding initiation and management  ASSESSMENT:   65 year old female who presented on 9/09 after a fall. PMH of ESRD on HD, CHF, T2DM, HTN, HLD, renal transplant. Pt found to have L femur fracture.  9/10 - rapid response for AMS, intubated during HD  Discussed pt with RN and during ICU rounds. Noted plan for ORIF today in the OR. Plan for tomorrow is IR for fistula interrogation and possible tunneled catheter placement. Per CCM, will attempt extubation after both procedures are completed.  OG tube in stomach per abdominal x-ray on 9/10 and is currently clamped. Consult received for tube feeding initiation and management after pt returns from OR.  Spoke with pt's husband at bedside. He reports pt has been eating well over the last 3 weeks. He states that pt consumes 2 meals on HD days and otherwise consumes 3 meals daily. He is not sure whether pt consumes snacks.  Pt's husband reports that pt was previously on a medication that prevented her from eating, drinking, or sleeping. Pt also had significant N/V. He is unsure what this medication was. Per review of allergies, it appears to be renvela. Pt's husband  reports that pt lost 27 lbs over the course of 2 weeks because of this.  Pt is currently well below EDW of 68.5 kg. Pt with mild pitting edema to BLE that may be masking additional weight loss. Per review of weight history in chart, pt with a 12.9 kg (28 lb) weight loss since 06/29/20. This is a 17.8% weight loss in less than 5 months which is severe and significant for timeframe. Pt meets criteria for malnutrition.  Admit weight: 65.8 kg Current weight: 59.7 kg EDW: 68.5 kg  Pt's husband reports that pt falls frequently but never this bad. He states that pt ambulates with a walker or uses a wheelchair if going out.  Patient is currently intubated on ventilator support MV: 7.4 L/min Temp (24hrs), Avg:98.4 F (36.9 C), Min:97.9 F (36.6 C), Max:99.3 F (37.4 C)  Drips: Fentanyl Levophed  Medications reviewed and include: calcitriol, phoslo TID with meals, colace, SSI q 4 hours, reglan 5 mg TID before meals, rena-vit, IV protonix, miralax, prednisone, senna, renvela TID with meals, IV abx  Labs reviewed: sodium 129, hemoglobin 9.1, phosphorus 6.4 on 9/11 CBG's: 138-247 x 24 hours  I/O's: +3.5 L since admit  NUTRITION - FOCUSED PHYSICAL EXAM:  Flowsheet Row Most Recent Value  Orbital Region Moderate depletion  Upper Arm Region No depletion  Thoracic and Lumbar Region Mild depletion  Buccal Region Unable to assess  Temple Region Mild depletion  Clavicle Bone Region Moderate depletion  Clavicle and Acromion Bone Region Moderate depletion  Scapular Bone Region  Moderate depletion  Dorsal Hand Moderate depletion  Patellar Region Moderate depletion  Anterior Thigh Region Moderate depletion  Posterior Calf Region Moderate depletion  Edema (RD Assessment) Mild  Hair Reviewed  Eyes Reviewed  Mouth Unable to assess  Skin Reviewed  Nails Reviewed       Diet Order:   Diet Order             Diet NPO time specified  Diet effective midnight           Diet NPO time specified   Diet effective now                   EDUCATION NEEDS:   Not appropriate for education at this time  Skin:  Skin Assessment: Skin Integrity Issues: Stage II: sacrum Other: skin tear R arm  Last BM:  11/13/2020 small type 6  Height:   Ht Readings from Last 1 Encounters:  11/13/20 '5\' 5"'$  (1.651 m)    Weight:   Wt Readings from Last 1 Encounters:  11/13/2020 59.7 kg    BMI:  Body mass index is 21.9 kg/m.  Estimated Nutritional Needs:   Kcal:  1800-2000  Protein:  85-100 grams  Fluid:  1000 ml + UOP    Gustavus Bryant, MS, RD, LDN Inpatient Clinical Dietitian Please see [facility] for contact information.

## 2020-11-15 NOTE — Progress Notes (Signed)
eLink Physician-Brief Progress Note Patient Name: [name] DOB: May 12, 1955 MRN: [id number]   Date of Service  12/03/2020  HPI/Events of Note  Patient with some incision site bleeding and a drop in hemoglobin from 9.1 to 7.5, patient is hemodynamically stable, per bedside RN there's been mild additional bleeding at the site.  eICU Interventions  Thrombin Pad + dry gauze pressure dressing ordered, stat PT-INR and PTT ordered, H & H at 3 A.M. 11/16/20,         Kerry Kass Karletta Millay 11/26/2020, 10:26 PM

## 2020-11-15 NOTE — Progress Notes (Signed)
NAME:  GERALYNN DEMCHAK, MRN:  [id number], DOB:  [birth year], LOS: 3 ADMISSION DATE:  11/04/2020, CONSULTATION DATE: 11/13/2020 REFERRING MD: Heath Lark DO, CHIEF COMPLAINT:  L femur fracture  History of Present Illness:  65 year old with hypertension, hyperlipidemia, diabetes, afib, systolic and diastolic CHF, end-stage renal disease presenting with left distal femur fracture after a fall.  Ortho is planning for OR intervention on 9/12 at [facility]  Attempted hemodialysis on 9/9 but aborted due to clotting of AV fistula, unable to return 300 cc of blood. She then had a femoral catheter placed and dialyzed today 9/10 with 1 unit of blood due to blood loss anemia.  During dialysis she developed acute respiratory failure, possible aspiration and emergently intubated.    PCCM consulted for transfer to [facility] for further management  Pertinent  Medical History   Past Medical History:  Diagnosis Date   Abnormal CXR (chest x-ray) 05/25/2019   Abnormal LFTs (liver function tests) 03/11/2018   Acute hypoxemic respiratory failure ([place]) 12/16/2018   Acute on chronic combined systolic and diastolic CHF (congestive heart failure) -EF 40 to 45 % 08/10/2019   Acute respiratory failure with hypoxia ([place]) 12/02/2018   Anemia    of chronic disease   Blood transfusion without reported diagnosis    Cardiovascular disease    nonobstructive   Carotid artery stenosis 2008   CHF (congestive heart failure) ([place])    Coronary artery disease    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   Last Assessment & Plan:  - Continuing home suppression therapy: Bactrim M-W-F, Prednisone 5 mg daily, Myfortic 360 mg BID and Prograf 1 mg BID - Appreciate Nephrology recs   Diabetes mellitus    Dyslipidemia    Edema of lower extremity    with hypo-albuminemia and profound protenuria   HCAP (healthcare-associated pneumonia)    Heart murmur    Hypertension    Lung collapse 12/15/2018   Mitral regurgitation    Nephrotic  syndrome    Patent foramen ovale    Pulmonary edema 08/10/2019   Pulmonary hypertension, moderate to severe [facility])    Pulmonary nodule    Renal insufficiency    Tricuspid regurgitation    Volume depletion, renal, due to output loss (renal deficit)   Afib rvr 07/2020 on amiodarone  Significant Hospital Events: Including procedures, antibiotic start and stop dates in addition to other pertinent events   9/9 Admitted after a fall with femur fracture, HD catheter placed due to a clotted AV fistula 9/10 intubated for acute respiratory failure, aspiration 9/11: Transferred to [facility] MICU 9/12: plan for surgery today  Interim History / Subjective:  Patient intubated and on fentanyl drip. Still requiring 4 mcg of levophed. Opens eyes and nods but does not follow commands.   Objective   Blood pressure (!) 110/46, pulse 93, temperature 98 F (36.7 C), temperature source Axillary, resp. rate 13, height '5\' 5"'$  (1.651 m), weight 59.7 kg, SpO2 100 %.    Vent Mode: PRVC FiO2 (%):  [40 %] 40 % Set Rate:  [14 bmp] 14 bmp Vt Set:  [450 mL] 450 mL PEEP:  [5 cmH20] 5 cmH20 Plateau Pressure:  [11 cmH20-12 cmH20] 11 cmH20   Intake/Output Summary (Last 24 hours) at 12/01/2020 0811 Last data filed at 11/10/2020 0730 Gross per 24 hour  Intake 1281.49 ml  Output --  Net 1281.49 ml   Filed Weights   11/13/20 1050 11/14/20 0230 11/13/2020 0422  Weight: 69.2 kg 69.8 kg 59.7 kg  Examination:  General:  critically ill appearing on mech vent, opens eyes HEENT: MM pink/moist; ETT in place Neuro: only on fentynl drip, opens eyes to voice but does not follow commands CV: s1s2, RRR, no m/r/g PULM:  lung apices clear BS bilaterally; on mech vent PRVC 40% GI: soft, non tender, non distended Extremities: warm/dry, no edema; brace on left leg  Resolved Hospital Problem list     Assessment & Plan:  Fall, left femur fracture -per ortho -Plan for or intervention by orthopedics today -knee immobilizer and  dressing in place  Acute respiratory failure w/ hypoxia Possible aspiration pneumonia WBC count increased to 29.9 with significant WBCs in trach aspirate but no organisms. As remained afebrile. -continue mechanical ventilation with plan to extubate after surgery -VAP prevention in place -wean fio2 for sats >90% -daily sbt/sat. Plan to likely extubate post surgery -f/u trach aspirate culture -continue zosyn for possible aspiration for 5 days -trend CBC/fever curve  Acute Blood Loss Anemia Shock: likely hypovolemic though given increasing WBC, spesis is not ruled out. S/p 2 units PRBC and 1L fluids. - wean levophed to map > 65 -transfuse for hgb <7 -infectious workup as above  Acute encephalopathy secondary to intradialytic hypotension -Improving with supportive care. Will likely attempt extubation after surgery  ESRD: on HD MWF S/p failed renal transplant Hyperkalemia- Resolved with diaylsis -nephro following -Failed dialysis 9/9 due to clotted AV fistula -Plan for IR intervention on 9/13 since planned for surgery today -Monitor BMP - Per nephro: resume calcitriol and renvela when able to tolerate PO - holding prograf for now, awaiting nephrology consultation - continue home prednisone  Chronic systolic and diastolic CHF: LVEF [id number] -daily weights -fluid restrict -hold home metoprolol  Hx HTN and HLD -continue statin -holding home meds in setting of hypotension  Hx of afib -continue amiodarone -holding eliquis until after surgery -holding metoprolol in setting of hypotension  T2DM w/ nephropathy and gastroparesis -SSI and CBG monitoring -continue reglan  Hx of generalized anxiety disorder P: -continue abilify and zoloft -will hold mirtazapine until extubated  Best Practice (right click and "Reselect all SmartList Selections" daily)   Diet/type: NPO w/ meds via tube DVT prophylaxis: prophylactic heparin ; holding home eliquis for surgery 9/12 GI prophylaxis:  PPI Lines: Dialysis Catheter Foley:  N/A Code Status:  full code Last date of multidisciplinary goals of care discussion [pending]  Labs   CBC: Recent Labs  Lab 11/05/2020 1015 11/13/20 0548 11/14/20 0745 11/14/20 1414 11/14/20 2146  WBC 19.2* 16.6* 32.1*  --  29.4*  NEUTROABS 16.1*  --   --   --   --   HGB 7.4* 6.4* 7.5* 8.8* 9.1*  HCT 25.0* 22.3* 24.5* 29.0* 28.6*  MCV 96.5 97.4 94.6  --  90.8  PLT 227 184 199  --  [id number]    Basic Metabolic Panel: Recent Labs  Lab 11/26/2020 1015 11/13/20 0548 11/14/20 0401 11/14/20 0745 11/14/20 0746 11/14/20 2146  NA 139 140  --   --  132* 129*  K 6.0* 5.9*  --   --  3.8 4.0  CL 101 102  --   --  96* 94*  CO2 19* 18*  --   --  17* 17*  GLUCOSE 219* 153*  --   --  184* 176*  BUN 91* 92*  --   --  27* 34*  CREATININE 12.96* 13.54*  --   --  5.81* 6.83*  CALCIUM 8.1* 7.7*  --   --  7.0* 6.9*  MG  --  2.1 TEST WILL BE CREDITED 1.7  --  1.8  PHOS  --   --   --   --  6.4*  --    GFR: Estimated Creatinine Clearance: 7.3 mL/min (A) (by C-G formula based on SCr of 6.83 mg/dL (H)). Recent Labs  Lab 11/08/2020 1015 11/13/20 0548 11/13/20 1330 11/14/20 0510 11/14/20 0745 11/14/20 1413 11/14/20 2146  PROCALCITON  --   --  3.92  --   --   --   --   WBC 19.2* 16.6*  --   --  32.1*  --  29.4*  LATICACIDVEN  --   --   --  2.3*  --  1.7  --     Liver Function Tests: Recent Labs  Lab 11/23/2020 1015 11/14/20 0746  AST 27  --   ALT 34  --   ALKPHOS 101  --   BILITOT 0.5  --   PROT 6.6  --   ALBUMIN 2.7* 2.3*   No results for input(s): LIPASE, AMYLASE in the last 168 hours. No results for input(s): AMMONIA in the last 168 hours.  ABG    Component Value Date/Time   PHART 7.421 11/13/2020 2309   PCO2ART 28.4 (L) 11/13/2020 2309   PO2ART 147 (H) 11/13/2020 2309   HCO3 20.4 11/13/2020 2309   TCO2 28 07/20/2019 1354   ACIDBASEDEF 5.5 (H) 11/13/2020 2309   O2SAT 98.7 11/13/2020 2309     Coagulation Profile: No results for  input(s): INR, PROTIME in the last 168 hours.  Cardiac Enzymes: No results for input(s): CKTOTAL, CKMB, CKMBINDEX, TROPONINI in the last 168 hours.  HbA1C: HB A1C (BAYER DCA - WAIVED)  Date/Time Value Ref Range Status  10/19/2020 09:41 AM 6.7 <7.0 % Final    Comment:                                          Diabetic Adult            <7.0                                       Healthy Adult        4.3 - 5.7                                                           (DCCT/NGSP) American Diabetes Association's Summary of Glycemic Recommendations for Adults with Diabetes: Hemoglobin A1c <7.0%. More stringent glycemic goals (A1c <6.0%) may further reduce complications at the cost of increased risk of hypoglycemia.   10/01/2018 11:48 AM 9.7 (H) <7.0 % Final    Comment:                                          Diabetic Adult            <7.0  Healthy Adult        4.3 - 5.7                                                           (DCCT/NGSP) American Diabetes Association's Summary of Glycemic Recommendations for Adults with Diabetes: Hemoglobin A1c <7.0%. More stringent glycemic goals (A1c <6.0%) may further reduce complications at the cost of increased risk of hypoglycemia.    Hgb A1c MFr Bld  Date/Time Value Ref Range Status  12/01/2020 10:15 AM 6.7 (H) 4.8 - 5.6 % Final    Comment:    (NOTE) Pre diabetes:          5.7%-6.4%  Diabetes:              >6.4%  Glycemic control for   <7.0% adults with diabetes   01/06/2020 04:34 PM 8.7 (H) 4.8 - 5.6 % Final    Comment:    (NOTE) Pre diabetes:          5.7%-6.4%  Diabetes:              >6.4%  Glycemic control for   <7.0% adults with diabetes     CBG: Recent Labs  Lab 11/14/20 1138 11/14/20 1527 11/14/20 2017 11/21/2020 0017 11/11/2020 0409  GLUCAP 247* 242* 192* 188* 163*      Critical care time:     Reece Agar, MS4

## 2020-11-15 NOTE — H&P (View-Only) (Signed)
Reason for Consult:Left tibia fracture Referring Physician: Ina Homes Time called: 0730 Time at bedside: [name] is an 65 y.o. female.  HPI: Shalicia fell while ambulating with her RW on 9/8. She suffered left knee pain but did not come to the ED until the following day. X-rays showed a distal femur fx and orthopedic surgery was consulted. She was transferred from [facility]. While getting dialyzed there she became unresponsive and required intubation.   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   Last Assessment & Plan:  - Continuing home suppression therapy: Bactrim M-W-F, Prednisone 5 mg daily, Myfortic 360 mg BID and Prograf 1 mg BID - Appreciate Nephrology recs   Diabetes mellitus    Dyslipidemia    Edema of lower extremity    with hypo-albuminemia and profound protenuria   HCAP (healthcare-associated pneumonia)    Heart murmur    Hypertension    Lung collapse 12/15/2018   Mitral regurgitation    Nephrotic syndrome    Patent foramen ovale    Pulmonary edema 08/10/2019   Pulmonary hypertension, moderate to severe (HCC)    Pulmonary nodule    Renal insufficiency    Tricuspid regurgitation    Volume depletion, renal, due to output loss (renal deficit)     Past Surgical History:  Procedure Laterality Date   A/V FISTULAGRAM N/A 04/05/2017   Procedure: A/V FISTULAGRAM - Left Arm;  Surgeon: Angelia Mould, MD;  Location: [facility];  Service: Cardiovascular;  Laterality: N/A;   A/V FISTULAGRAM Left 09/18/2017   Procedure: A/V FISTULAGRAM;  Surgeon: Serafina Mitchell, MD;  Location: [facility];  Service: Cardiovascular;  Laterality: Left;   A/V SHUNT INTERVENTION Left 04/05/2017   Procedure: A/V SHUNT INTERVENTION;  Surgeon: Angelia Mould, MD;  Location: [facility];  Service: Cardiovascular;  Laterality: Left;   CARDIAC CATHETERIZATION  2008   IR REMOVAL TUN CV CATH W/O FL  11/02/2016   KIDNEY TRANSPLANT Right 02/23/2009   PERIPHERAL VASCULAR BALLOON ANGIOPLASTY Left 09/18/2017   Procedure: PERIPHERAL VASCULAR BALLOON ANGIOPLASTY;  Surgeon: Serafina Mitchell, MD;  Location: [facility];  Service: Cardiovascular;  Laterality: Left;  Arm Fistula    Family History  Problem Relation Age of Onset   Kidney disease Mother    Diabetes Mother    Diabetes Father    Heart disease Father     Social History:  reports that she has never smoked. She has never used smokeless tobacco. She reports that she does not drink alcohol and does not use drugs.  Allergies:  Allergies  Allergen Reactions   Other Other (See Comments)    Tape Bruises and tears skin, Paper tape tolerated   Tape Other (See Comments)    Bruises and tears skin Paper tape tolerated Bruises and tears skin Paper tape tolerated Bruises and tears skin Paper tape  tolerated   Renvela [Sevelamer] Nausea And Vomiting    Inability to eat or sleep per spouse. States that all side effects listed for this medication, the patient did experience    Medications: I have reviewed the patient's current medications.  Results for orders placed or performed during the hospital encounter of 11/27/2020 (from the past 48 hour(s))  Glucose, capillary     Status: Abnormal   Collection Time: 11/13/20 12:39 PM  Result Value Ref Range   Glucose-Capillary 138 (H) 70 - 99 mg/dL    Comment: Glucose reference  range applies only to samples taken after fasting for at least 8 hours.  Procalcitonin - Baseline     Status: None   Collection Time: 11/13/20  1:30 PM  Result Value Ref Range   Procalcitonin 3.92 ng/mL    Comment:        Interpretation: PCT > 2 ng/mL: Systemic infection (sepsis) is likely, unless other causes are known. (NOTE)       Sepsis PCT Algorithm           Lower Respiratory Tract                                      Infection PCT Algorithm    ----------------------------     ----------------------------         PCT < 0.25 ng/mL                PCT < 0.10 ng/mL          Strongly encourage             Strongly discourage   discontinuation of antibiotics    initiation of antibiotics    ----------------------------     -----------------------------       PCT 0.25 - 0.50 ng/mL            PCT 0.10 - 0.25 ng/mL               OR       >80% decrease in PCT            Discourage initiation of                                            antibiotics      Encourage discontinuation           of antibiotics    ----------------------------     -----------------------------         PCT >= 0.50 ng/mL              PCT 0.26 - 0.50 ng/mL               AND       <80% decrease in PCT              Encourage initiation of                                             antibiotics       Encourage continuation           of antibiotics    ----------------------------     -----------------------------  PCT >= 0.50 ng/mL                  PCT > 0.50 ng/mL               AND         increase in PCT                  Strongly encourage                                      initiation of antibiotics    Strongly encourage escalation           of antibiotics                                     -----------------------------                                           PCT <= 0.25 ng/mL                                                 OR                                        > 80% decrease in PCT                                       Discontinue / Do not initiate                                             antibiotics  Performed at [facility], [address], [place], [place] [address]   Draw ABG 1 hour after initiation of ventilator     Status: Abnormal   Collection Time: 11/13/20  2:18 PM  Result Value Ref Range   FIO2 100.00    pH, Arterial 7.471 (H) 7.350 - 7.450   pCO2 arterial 33.2 32.0 - 48.0 mmHg   pO2, Arterial 404 (H) 83.0 - 108.0 mmHg   Bicarbonate NOT CALCULATED 20.0 - 28.0 mmol/L   Acid-Base Excess 0.6 0.0 - 2.0 mmol/L   O2 Saturation >100.0 %   Patient temperature 37.0    Allens test (pass/fail) PASS PASS    Comment: Performed at [facility], [address], [place], [place] [address]  Glucose, capillary     Status: Abnormal   Collection Time: 11/13/20  5:06 PM  Result Value Ref Range   Glucose-Capillary 102 (H) 70 - 99 mg/dL    Comment: Glucose reference range applies only to samples taken after fasting for at least 8 hours.  Glucose, capillary     Status: Abnormal   Collection Time: 11/13/20  7:45 PM  Result Value Ref Range   Glucose-Capillary 122 (H) 70 - 99 mg/dL  Comment: Glucose reference range applies only to samples taken after fasting for at least 8 hours.  Glucose, capillary     Status: Abnormal   Collection Time: 11/13/20 10:09 PM  Result Value Ref Range   Glucose-Capillary 146 (H) 70 - 99 mg/dL    Comment: Glucose reference range applies only to samples taken after fasting for at least 8 hours.  Blood gas, arterial     Status: Abnormal   Collection Time: 11/13/20 11:09 PM  Result Value Ref Range   FIO2 40.00    pH, Arterial 7.421 7.350 - 7.450   pCO2 arterial 28.4 (L) 32.0 - 48.0 mmHg   pO2, Arterial 147 (H) 83.0 - 108.0 mmHg   Bicarbonate 20.4 20.0 - 28.0 mmol/L   Acid-base deficit 5.5 (H) 0.0 - 2.0 mmol/L   O2 Saturation 98.7 %   Patient temperature 37.0    Allens test (pass/fail) NOT INDICATED (A) PASS    Comment: Performed at [facility], [address] [place], [place] [address]  Glucose, capillary     Status: Abnormal   Collection Time: 11/13/20 11:30 PM  Result Value Ref Range   Glucose-Capillary 152 (H) 70 - 99 mg/dL    Comment: Glucose reference range applies only to samples taken after fasting for at least 8 hours.  Glucose, capillary     Status: Abnormal   Collection Time: 11/14/20  2:22 AM  Result Value Ref Range   Glucose-Capillary 152 (H) 70 - 99 mg/dL    Comment: Glucose reference range applies only to samples taken after fasting for at least 8 hours.  MRSA Next Gen by PCR, Nasal     Status: None   Collection Time: 11/14/20  2:35 AM   Specimen: Nasal Mucosa; Nasal Swab  Result Value Ref Range   MRSA by PCR Next Gen NOT DETECTED NOT DETECTED    Comment: (NOTE) The GeneXpert MRSA Assay (FDA approved for NASAL specimens only), is one component of a comprehensive MRSA colonization surveillance program. It is not intended to diagnose MRSA infection nor to guide or monitor treatment for MRSA infections. Test performance is not FDA approved in patients less than 90 years old. Performed at [facility], [place] [address], [place], [place] [address]   Prepare RBC (crossmatch)     Status: None   Collection Time: 11/14/20  2:42 AM  Result Value Ref Range   Order Confirmation      ORDER PROCESSED BY BLOOD BANK Performed at [facility], [place] [address] [place], [place] [address]   Type and screen [place]     Status: None   Collection Time: 11/14/20  4:00 AM  Result Value Ref Range   ABO/RH(D) A NEG    Antibody Screen NEG    Sample Expiration 11/17/2020,2359    Unit Number [id number]    Blood Component Type RED CELLS,LR    Unit division 00    Status of Unit ISSUED,FINAL    Transfusion Status OK TO TRANSFUSE    Crossmatch Result      Compatible Performed at [facility], [place] [address] [place], [place] [address]   Brain natriuretic peptide     Status: Abnormal    Collection Time: 11/14/20  4:00 AM  Result Value Ref Range   B Natriuretic Peptide 1,229.2 (H) 0.0 - 100.0 pg/mL    Comment: Performed at [facility] [address], [place], [place] [address]  Triglycerides  Status: Abnormal   Collection Time: 11/14/20  4:00 AM  Result Value Ref Range   Triglycerides 332 (H) <150 mg/dL    Comment: Performed at [facility] [address] [place], [place] [address]  Magnesium     Status: None   Collection Time: 11/14/20  4:01 AM  Result Value Ref Range   Magnesium TEST WILL BE CREDITED 1.7 - 2.4 mg/dL    Comment: DID NOT CROSS FROM SQ TO EPIC SEE [id number] Performed at [facility], [place] [address] [place], [place] [address] CORRECTED ON 09/11 AT [id number]: PREVIOUSLY REPORTED AS 1.7   Glucose, capillary     Status: Abnormal   Collection Time: 11/14/20  4:15 AM  Result Value Ref Range   Glucose-Capillary 173 (H) 70 - 99 mg/dL    Comment: Glucose reference range applies only to samples taken after fasting for at least 8 hours.  Lactic acid, plasma     Status: Abnormal   Collection Time: 11/14/20  5:10 AM  Result Value Ref Range   Lactic Acid, Venous 2.3 (HH) 0.5 - 1.9 mmol/L    Comment: CRITICAL RESULT CALLED TO, READ BACK BY AND VERIFIED WITH: Suzan Slick B,RN 11/14/20 0547 WAYK Performed at [facility], [address] [place], [place] [address]   Cortisol, Random     Status: None   Collection Time: 11/14/20  5:36 AM  Result Value Ref Range   Cortisol, Plasma 20.4 ug/dL    Comment: (NOTE) AM    6.7 - 22.6 ug/dL PM   <10.0       ug/dL Performed at [facility] [address], [place], [place] [address]   Cortisol, Random     Status: None   Collection Time: 11/14/20  5:45 AM  Result Value Ref Range   Cortisol, Plasma 20.7 ug/dL    Comment: (NOTE) AM    6.7 - 22.6 ug/dL PM   <10.0       ug/dL Performed at [facility] [address] [place], [place] [address]   Cortisol     Status: None   Collection  Time: 11/14/20  6:10 AM  Result Value Ref Range   Cortisol, Plasma 25.0 ug/dL    Comment: (NOTE) AM    6.7 - 22.6 ug/dL PM   <10.0       ug/dL Performed at [facility] [address], [place], [place] [address]   Glucose, capillary     Status: Abnormal   Collection Time: 11/14/20  7:09 AM  Result Value Ref Range   Glucose-Capillary 191 (H) 70 - 99 mg/dL    Comment: Glucose reference range applies only to samples taken after fasting for at least 8 hours.  CBC     Status: Abnormal   Collection Time: 11/14/20  7:45 AM  Result Value Ref Range   WBC 32.1 (H) 4.0 - 10.5 K/uL   RBC 2.59 (L) 3.87 - 5.11 MIL/uL   Hemoglobin 7.5 (L) 12.0 - 15.0 g/dL   HCT 24.5 (L) 36.0 - 46.0 %   MCV 94.6 80.0 - 100.0 fL   MCH 29.0 26.0 - 34.0 pg   MCHC 30.6 30.0 - 36.0 g/dL   RDW 17.1 (H) 11.5 - 15.5 %   Platelets 199 150 - 400 K/uL   nRBC 0.2 0.0 - 0.2 %    Comment: Performed at [facility], [place] [address], [place], [place] [address]  Magnesium  Status: None   Collection Time: 11/14/20  7:45 AM  Result Value Ref Range   Magnesium 1.7 1.7 - 2.4 mg/dL    Comment: Performed at [facility] [address], [place], [place] [address]  Ferritin     Status: Abnormal   Collection Time: 11/14/20  7:45 AM  Result Value Ref Range   Ferritin 1,392 (H) 11 - 307 ng/mL    Comment: RESULTS CONFIRMED BY MANUAL DILUTION Performed at [facility], [place] [address] [place], [place] [address]   Iron and TIBC     Status: Abnormal   Collection Time: 11/14/20  7:45 AM  Result Value Ref Range   Iron 19 (L) 28 - 170 ug/dL   TIBC 129 (L) 250 - 450 ug/dL   Saturation Ratios 15 10.4 - 31.8 %   UIBC 110 ug/dL    Comment: Performed at [facility], [place] [address] [place], [place] [address]  Renal function panel     Status: Abnormal   Collection Time: 11/14/20  7:46 AM  Result Value Ref Range   Sodium 132 (L) 135 - 145 mmol/L   Potassium 3.8 3.5 - 5.1 mmol/L   Chloride 96 (L) 98 -  111 mmol/L   CO2 17 (L) 22 - 32 mmol/L   Glucose, Bld 184 (H) 70 - 99 mg/dL   BUN 27 (H) 8 - 23 mg/dL   Creatinine, Ser 5.81 (H) 0.44 - 1.00 mg/dL   Calcium 7.0 (L) 8.9 - 10.3 mg/dL   Phosphorus 6.4 (H) 2.5 - 4.6 mg/dL   Albumin 2.3 (L) 3.5 - 5.0 g/dL   GFR, Estimated 8 (L) >60 mL/min    Comment: (NOTE) Calculated using the CKD-EPI Creatinine Equation (2021)    Anion gap 19 (H) 5 - 15    Comment: Performed at [facility], [address] [place], [place] [address]  Culture, Respiratory w Gram Stain     Status: None (Preliminary result)   Collection Time: 11/14/20  8:17 AM   Specimen: Tracheal Aspirate; Respiratory  Result Value Ref Range   Specimen Description TRACHEAL ASPIRATE    Special Requests NONE    Gram Stain      ABUNDANT WBC PRESENT, PREDOMINANTLY PMN NO ORGANISMS SEEN    Culture      CULTURE REINCUBATED FOR BETTER GROWTH Performed at [facility], [place] [address] [place], [place] [address]    Report Status PENDING   Glucose, capillary     Status: Abnormal   Collection Time: 11/14/20 11:38 AM  Result Value Ref Range   Glucose-Capillary 247 (H) 70 - 99 mg/dL    Comment: Glucose reference range applies only to samples taken after fasting for at least 8 hours.  Lactic acid, plasma     Status: None   Collection Time: 11/14/20  2:13 PM  Result Value Ref Range   Lactic Acid, Venous 1.7 0.5 - 1.9 mmol/L    Comment: Performed at [facility] [address], [place], [place] [address]  Hemoglobin and hematocrit, blood     Status: Abnormal   Collection Time: 11/14/20  2:14 PM  Result Value Ref Range   Hemoglobin 8.8 (L) 12.0 - 15.0 g/dL   HCT 29.0 (L) 36.0 - 46.0 %    Comment: Performed at [facility], [place] [address], [place], [place] [address]  Glucose, capillary     Status: Abnormal   Collection Time: 11/14/20  3:27 PM  Result Value Ref Range   Glucose-Capillary 242 (H) 70 - 99 mg/dL    Comment: Glucose reference range applies only to  samples taken after fasting for at least 8 hours.  Glucose, capillary     Status: Abnormal   Collection Time: 11/14/20  8:17 PM  Result Value Ref Range   Glucose-Capillary 192 (H) 70 - 99 mg/dL    Comment: Glucose reference range applies only to samples taken after fasting for at least 8 hours.  Basic metabolic panel     Status: Abnormal   Collection Time: 11/14/20  9:46 PM  Result Value Ref Range   Sodium 129 (L) 135 - 145 mmol/L   Potassium 4.0 3.5 - 5.1 mmol/L   Chloride 94 (L) 98 - 111 mmol/L   CO2 17 (L) 22 - 32 mmol/L   Glucose, Bld 176 (H) 70 - 99 mg/dL    Comment: Glucose reference range applies only to samples taken after fasting for at least 8 hours.   BUN 34 (H) 8 - 23 mg/dL   Creatinine, Ser 6.83 (H) 0.44 - 1.00 mg/dL   Calcium 6.9 (L) 8.9 - 10.3 mg/dL   GFR, Estimated 6 (L) >60 mL/min    Comment: (NOTE) Calculated using the CKD-EPI Creatinine Equation (2021)    Anion gap 18 (H) 5 - 15    Comment: Performed at [facility] [address], [place], [place] [address]  Magnesium     Status: None   Collection Time: 11/14/20  9:46 PM  Result Value Ref Range   Magnesium 1.8 1.7 - 2.4 mg/dL    Comment: Performed at [facility], [place] [address] [place], [place] [address]  CBC     Status: Abnormal   Collection Time: 11/14/20  9:46 PM  Result Value Ref Range   WBC 29.4 (H) 4.0 - 10.5 K/uL   RBC 3.15 (L) 3.87 - 5.11 MIL/uL   Hemoglobin 9.1 (L) 12.0 - 15.0 g/dL   HCT 28.6 (L) 36.0 - 46.0 %   MCV 90.8 80.0 - 100.0 fL   MCH 28.9 26.0 - 34.0 pg   MCHC 31.8 30.0 - 36.0 g/dL   RDW 17.5 (H) 11.5 - 15.5 %   Platelets 194 150 - 400 K/uL   nRBC 0.1 0.0 - 0.2 %    Comment: Performed at [facility], [place] [address], [place], [place] [address]  Glucose, capillary     Status: Abnormal   Collection Time: 12/03/2020 12:17 AM  Result Value Ref Range   Glucose-Capillary 188 (H) 70 - 99 mg/dL    Comment: Glucose reference range applies only to samples taken after  fasting for at least 8 hours.  Glucose, capillary     Status: Abnormal   Collection Time: 11/09/2020  4:09 AM  Result Value Ref Range   Glucose-Capillary 163 (H) 70 - 99 mg/dL    Comment: Glucose reference range applies only to samples taken after fasting for at least 8 hours.    DG Abd 1 View  Result Date: 11/13/2020 CLINICAL DATA:  Orogastric tube placement. EXAM: ABDOMEN - 1 VIEW COMPARISON:  Abdomen and pelvis CT dated 08/27/2020 FINDINGS: Orogastric 2 tip in the mid to distal stomach and side hole in the proximal to mid stomach. Normal bowel gas pattern. Cholecystectomy clips. Dense atheromatous arterial calcifications. Mild dextroconvex scoliosis. IMPRESSION: Orogastric tube tip and side hole in the stomach. Electronically Signed   By: Claudie Revering M.D.   On: 11/13/2020 21:30  DG Chest Port 1 View  Result Date: 11/14/2020 CLINICAL DATA:  Respiratory failure with hypoxia EXAM: PORTABLE CHEST 1 VIEW COMPARISON:  Yesterday FINDINGS: Endotracheal tube terminates 5.3 cm above carina. Nasogastric tube extends beyond the inferior aspect of the film. Cholecystectomy clips. Midline trachea. Moderate cardiomegaly. Atherosclerosis in the transverse aorta. No pleural effusion or pneumothorax. Moderate diffuse interstitial thickening. No well-defined lobar consolidation. IMPRESSION: Similar appearance of cardiomegaly and interstitial thickening, likely related to clinical history of heart failure. Aortic Atherosclerosis (ICD10-I70.0). Electronically Signed   By: Abigail Miyamoto M.D.   On: 11/14/2020 08:46   DG Chest Port 1 View  Result Date: 11/13/2020 CLINICAL DATA:  Evaluate for endotracheal tube placement. EXAM: PORTABLE CHEST 1 VIEW COMPARISON:  March 29, 2020 FINDINGS: Endotracheal tube is identified with distal tip 4.8 cm from carina. There is no pneumothorax. The lungs are clear. The mediastinal contour and cardiac silhouette are normal. The osseous structures are stable. IMPRESSION: Endotracheal  tube identified in good position.  No pneumothorax. Electronically Signed   By: Abelardo Diesel M.D.   On: 11/13/2020 13:19    Review of Systems  Unable to perform ROS: Intubated  Blood pressure (!) 110/46, pulse 93, temperature 98 F (36.7 C), temperature source Axillary, resp. rate 13, height '5\' 5"'$  (1.651 m), weight 59.7 kg, SpO2 100 %. Physical Exam Constitutional:      General: She is not in acute distress.    Appearance: She is well-developed. She is not diaphoretic.  HENT:     Head: Normocephalic and atraumatic.  Eyes:     General: No scleral icterus.       Right eye: No discharge.        Left eye: No discharge.     Conjunctiva/sclera: Conjunctivae normal.  Cardiovascular:     Rate and Rhythm: Normal rate and regular rhythm.  Pulmonary:     Effort: Pulmonary effort is normal. No respiratory distress.  Musculoskeletal:     Comments: LLE 1cm superficial abrasion ant knee, scattered diffuse ecchymoses of various ages, no rash, KI in place  No knee or ankle effusion  Sens DPN, SPN, TN could not assess  Motor EHL, ext, flex, evers could not assess  DP 1+, PT 0, No significant edema  Skin:    General: Skin is warm and dry.  Psychiatric:     Comments: Sedated    Assessment/Plan: Left distal femur fx -- Plan ORIF today by Dr. Doreatha Martin.  Multiple medical problems including chronic systolic heart failure, type 2 diabetes with nephropathy, end-stage renal disease on HD MWF, hypertension, hyperlipidemia, and history of renal transplant -- per primary service    Lisette Abu, PA-C Orthopedic Surgery [phone] 11/10/2020, 9:03 AM

## 2020-11-15 NOTE — Interval H&P Note (Signed)
History and Physical Interval Note:  11/16/2020 1:28 PM  [name]  has presented today for surgery, with the diagnosis of Left distal femur fractur.  The various methods of treatment have been discussed with the patient and family. After consideration of risks, benefits and other options for treatment, the patient has consented to  Procedure(s): OPEN REDUCTION INTERNAL FIXATION (ORIF) DISTAL FEMUR FRACTURE (Left) as a surgical intervention.  The patient's history has been reviewed, patient examined, no change in status, stable for surgery.  I have reviewed the patient's chart and labs.  Questions were answered to the patient's satisfaction.     Lennette Bihari P Adamarys Shall

## 2020-11-15 NOTE — Consult Note (Signed)
Reason for Consult:Left tibia fracture Referring Physician: Ina Homes Time called: 0730 Time at bedside: [name] is an 65 y.o. female.  HPI: [name] fell while ambulating with her RW on 9/8. She suffered left knee pain but did not come to the ED until the following day. X-rays showed a distal femur fx and orthopedic surgery was consulted. She was transferred from [facility]. While getting dialyzed there she became unresponsive and required intubation.   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   Last Assessment & Plan:  - Continuing home suppression therapy: Bactrim M-W-F, Prednisone 5 mg daily, Myfortic 360 mg BID and Prograf 1 mg BID - Appreciate Nephrology recs   Diabetes mellitus    Dyslipidemia    Edema of lower extremity    with hypo-albuminemia and profound protenuria   HCAP (healthcare-associated pneumonia)    Heart murmur    Hypertension    Lung collapse 12/15/2018   Mitral regurgitation    Nephrotic syndrome    Patent foramen ovale    Pulmonary edema 08/10/2019   Pulmonary hypertension, moderate to severe (HCC)    Pulmonary nodule    Renal insufficiency    Tricuspid regurgitation    Volume depletion, renal, due to output loss (renal deficit)     Past Surgical History:  Procedure Laterality Date   A/V FISTULAGRAM N/A 04/05/2017   Procedure: A/V FISTULAGRAM - Left Arm;  Surgeon: Angelia Mould, MD;  Location: [place] [facility];  Service: Cardiovascular;  Laterality: N/A;   A/V FISTULAGRAM Left 09/18/2017   Procedure: A/V FISTULAGRAM;  Surgeon: Serafina Mitchell, MD;  Location: [facility];  Service: Cardiovascular;  Laterality: Left;   A/V SHUNT INTERVENTION Left 04/05/2017   Procedure: A/V SHUNT INTERVENTION;  Surgeon: Angelia Mould, MD;  Location: [facility];  Service: Cardiovascular;  Laterality: Left;   CARDIAC CATHETERIZATION  2008   IR REMOVAL TUN CV CATH W/O FL  11/02/2016   KIDNEY TRANSPLANT Right 02/23/2009   PERIPHERAL VASCULAR BALLOON ANGIOPLASTY Left 09/18/2017   Procedure: PERIPHERAL VASCULAR BALLOON ANGIOPLASTY;  Surgeon: Serafina Mitchell, MD;  Location: [facility];  Service: Cardiovascular;  Laterality: Left;  Arm Fistula    Family History  Problem Relation Age of Onset   Kidney disease Mother    Diabetes Mother    Diabetes Father    Heart disease Father     Social History:  reports that she has never smoked. She has never used smokeless tobacco. She reports that she does not drink alcohol and does not use drugs.  Allergies:  Allergies  Allergen Reactions   Other Other (See Comments)    Tape Bruises and tears skin, Paper tape tolerated   Tape Other (See Comments)    Bruises and tears skin Paper tape tolerated Bruises and tears skin Paper tape tolerated Bruises and tears skin Paper tape  tolerated   Renvela [Sevelamer] Nausea And Vomiting    Inability to eat or sleep per spouse. States that all side effects listed for this medication, the patient did experience    Medications: I have reviewed the patient's current medications.  Results for orders placed or performed during the hospital encounter of 11/29/2020 (from the past 48 hour(s))  Glucose, capillary     Status: Abnormal   Collection Time: 11/13/20 12:39 PM  Result Value Ref Range   Glucose-Capillary 138 (H) 70 - 99 mg/dL    Comment: Glucose reference  range applies only to samples taken after fasting for at least 8 hours.  Procalcitonin - Baseline     Status: None   Collection Time: 11/13/20  1:30 PM  Result Value Ref Range   Procalcitonin 3.92 ng/mL    Comment:        Interpretation: PCT > 2 ng/mL: Systemic infection (sepsis) is likely, unless other causes are known. (NOTE)       Sepsis PCT Algorithm           Lower Respiratory Tract                                      Infection PCT Algorithm    ----------------------------     ----------------------------         PCT < 0.25 ng/mL                PCT < 0.10 ng/mL          Strongly encourage             Strongly discourage   discontinuation of antibiotics    initiation of antibiotics    ----------------------------     -----------------------------       PCT 0.25 - 0.50 ng/mL            PCT 0.10 - 0.25 ng/mL               OR       >80% decrease in PCT            Discourage initiation of                                            antibiotics      Encourage discontinuation           of antibiotics    ----------------------------     -----------------------------         PCT >= 0.50 ng/mL              PCT 0.26 - 0.50 ng/mL               AND       <80% decrease in PCT              Encourage initiation of                                             antibiotics       Encourage continuation           of antibiotics    ----------------------------     -----------------------------  PCT >= 0.50 ng/mL                  PCT > 0.50 ng/mL               AND         increase in PCT                  Strongly encourage                                      initiation of antibiotics    Strongly encourage escalation           of antibiotics                                     -----------------------------                                           PCT <= 0.25 ng/mL                                                 OR                                        > 80% decrease in PCT                                       Discontinue / Do not initiate                                             antibiotics  Performed at [facility], [address], [place], [place] [address]   Draw ABG 1 hour after initiation of ventilator     Status: Abnormal   Collection Time: 11/13/20  2:18 PM  Result Value Ref Range   FIO2 100.00    pH, Arterial 7.471 (H) 7.350 - 7.450   pCO2 arterial 33.2 32.0 - 48.0 mmHg   pO2, Arterial 404 (H) 83.0 - 108.0 mmHg   Bicarbonate NOT CALCULATED 20.0 - 28.0 mmol/L   Acid-Base Excess 0.6 0.0 - 2.0 mmol/L   O2 Saturation >100.0 %   Patient temperature 37.0    Allens test (pass/fail) PASS PASS    Comment: Performed at [facility], [address] [place], [place] [address]  Glucose, capillary     Status: Abnormal   Collection Time: 11/13/20  5:06 PM  Result Value Ref Range   Glucose-Capillary 102 (H) 70 - 99 mg/dL    Comment: Glucose reference range applies only to samples taken after fasting for at least 8 hours.  Glucose, capillary     Status: Abnormal   Collection Time: 11/13/20  7:45 PM  Result Value Ref Range   Glucose-Capillary 122 (H) 70 - 99 mg/dL  Comment: Glucose reference range applies only to samples taken after fasting for at least 8 hours.  Glucose, capillary     Status: Abnormal   Collection Time: 11/13/20 10:09 PM  Result Value Ref Range   Glucose-Capillary 146 (H) 70 - 99 mg/dL    Comment: Glucose reference range applies only to samples taken after fasting for at least 8 hours.  Blood gas, arterial     Status: Abnormal   Collection Time: 11/13/20 11:09 PM  Result Value Ref Range   FIO2 40.00    pH, Arterial 7.421 7.350 - 7.450   pCO2 arterial 28.4 (L) 32.0 - 48.0 mmHg   pO2, Arterial 147 (H) 83.0 - 108.0 mmHg   Bicarbonate 20.4 20.0 - 28.0 mmol/L   Acid-base deficit 5.5 (H) 0.0 - 2.0 mmol/L   O2 Saturation 98.7 %   Patient temperature 37.0    Allens test (pass/fail) NOT INDICATED (A) PASS    Comment: Performed at [facility], [address] [place], [place] [address]  Glucose, capillary     Status: Abnormal   Collection Time: 11/13/20 11:30 PM  Result Value Ref Range   Glucose-Capillary 152 (H) 70 - 99 mg/dL    Comment: Glucose reference range applies only to samples taken after fasting for at least 8 hours.  Glucose, capillary     Status: Abnormal   Collection Time: 11/14/20  2:22 AM  Result Value Ref Range   Glucose-Capillary 152 (H) 70 - 99 mg/dL    Comment: Glucose reference range applies only to samples taken after fasting for at least 8 hours.  MRSA Next Gen by PCR, Nasal     Status: None   Collection Time: 11/14/20  2:35 AM   Specimen: Nasal Mucosa; Nasal Swab  Result Value Ref Range   MRSA by PCR Next Gen NOT DETECTED NOT DETECTED    Comment: (NOTE) The GeneXpert MRSA Assay (FDA approved for NASAL specimens only), is one component of a comprehensive MRSA colonization surveillance program. It is not intended to diagnose MRSA infection nor to guide or monitor treatment for MRSA infections. Test performance is not FDA approved in patients less than 82 years old. Performed at [facility], [place] [address], [place], [place] [address]   Prepare RBC (crossmatch)     Status: None   Collection Time: 11/14/20  2:42 AM  Result Value Ref Range   Order Confirmation      ORDER PROCESSED BY BLOOD BANK Performed at [facility], [place] [address] [place], [place] [address]   Type and screen [place]     Status: None   Collection Time: 11/14/20  4:00 AM  Result Value Ref Range   ABO/RH(D) A NEG    Antibody Screen NEG    Sample Expiration 11/17/2020,2359    Unit Number [id number]    Blood Component Type RED CELLS,LR    Unit division 00    Status of Unit ISSUED,FINAL    Transfusion Status OK TO TRANSFUSE    Crossmatch Result      Compatible Performed at [facility], [place] [address], [place], [place] [address]   Brain natriuretic peptide     Status: Abnormal    Collection Time: 11/14/20  4:00 AM  Result Value Ref Range   B Natriuretic Peptide 1,229.2 (H) 0.0 - 100.0 pg/mL    Comment: Performed at [facility] [address] [place], [place] [address]  Triglycerides  Status: Abnormal   Collection Time: 11/14/20  4:00 AM  Result Value Ref Range   Triglycerides 332 (H) <150 mg/dL    Comment: Performed at [facility] [address], [place], [place] [address]  Magnesium     Status: None   Collection Time: 11/14/20  4:01 AM  Result Value Ref Range   Magnesium TEST WILL BE CREDITED 1.7 - 2.4 mg/dL    Comment: DID NOT CROSS FROM SQ TO EPIC SEE [id number] Performed at [facility], [place] [address] , [place] [address] CORRECTED ON 09/11 AT [id number]: PREVIOUSLY REPORTED AS 1.7   Glucose, capillary     Status: Abnormal   Collection Time: 11/14/20  4:15 AM  Result Value Ref Range   Glucose-Capillary 173 (H) 70 - 99 mg/dL    Comment: Glucose reference range applies only to samples taken after fasting for at least 8 hours.  Lactic acid, plasma     Status: Abnormal   Collection Time: 11/14/20  5:10 AM  Result Value Ref Range   Lactic Acid, Venous 2.3 (HH) 0.5 - 1.9 mmol/L    Comment: CRITICAL RESULT CALLED TO, READ BACK BY AND VERIFIED WITH: Suzan Slick B,RN 11/14/20 0547 WAYK Performed at [facility], [address] [place], [place] [address]   Cortisol, Random     Status: None   Collection Time: 11/14/20  5:36 AM  Result Value Ref Range   Cortisol, Plasma 20.4 ug/dL    Comment: (NOTE) AM    6.7 - 22.6 ug/dL PM   <10.0       ug/dL Performed at [facility] [address] [place], [place] [address]   Cortisol, Random     Status: None   Collection Time: 11/14/20  5:45 AM  Result Value Ref Range   Cortisol, Plasma 20.7 ug/dL    Comment: (NOTE) AM    6.7 - 22.6 ug/dL PM   <10.0       ug/dL Performed at [facility] [address], [place], [place] [address]   Cortisol     Status: None   Collection  Time: 11/14/20  6:10 AM  Result Value Ref Range   Cortisol, Plasma 25.0 ug/dL    Comment: (NOTE) AM    6.7 - 22.6 ug/dL PM   <10.0       ug/dL Performed at [facility] [address], [place], [place] [address]   Glucose, capillary     Status: Abnormal   Collection Time: 11/14/20  7:09 AM  Result Value Ref Range   Glucose-Capillary 191 (H) 70 - 99 mg/dL    Comment: Glucose reference range applies only to samples taken after fasting for at least 8 hours.  CBC     Status: Abnormal   Collection Time: 11/14/20  7:45 AM  Result Value Ref Range   WBC 32.1 (H) 4.0 - 10.5 K/uL   RBC 2.59 (L) 3.87 - 5.11 MIL/uL   Hemoglobin 7.5 (L) 12.0 - 15.0 g/dL   HCT 24.5 (L) 36.0 - 46.0 %   MCV 94.6 80.0 - 100.0 fL   MCH 29.0 26.0 - 34.0 pg   MCHC 30.6 30.0 - 36.0 g/dL   RDW 17.1 (H) 11.5 - 15.5 %   Platelets 199 150 - 400 K/uL   nRBC 0.2 0.0 - 0.2 %    Comment: Performed at [facility], [place] [address] [place], [place] [address]  Magnesium  Status: None   Collection Time: 11/14/20  7:45 AM  Result Value Ref Range   Magnesium 1.7 1.7 - 2.4 mg/dL    Comment: Performed at [facility] [address] [place], [place] [address]  Ferritin     Status: Abnormal   Collection Time: 11/14/20  7:45 AM  Result Value Ref Range   Ferritin 1,392 (H) 11 - 307 ng/mL    Comment: RESULTS CONFIRMED BY MANUAL DILUTION Performed at [facility], [place] [address], [place], [place] [address]   Iron and TIBC     Status: Abnormal   Collection Time: 11/14/20  7:45 AM  Result Value Ref Range   Iron 19 (L) 28 - 170 ug/dL   TIBC 129 (L) 250 - 450 ug/dL   Saturation Ratios 15 10.4 - 31.8 %   UIBC 110 ug/dL    Comment: Performed at [facility], [place] [address] [place], [place] [address]  Renal function panel     Status: Abnormal   Collection Time: 11/14/20  7:46 AM  Result Value Ref Range   Sodium 132 (L) 135 - 145 mmol/L   Potassium 3.8 3.5 - 5.1 mmol/L   Chloride 96 (L) 98 -  111 mmol/L   CO2 17 (L) 22 - 32 mmol/L   Glucose, Bld 184 (H) 70 - 99 mg/dL   BUN 27 (H) 8 - 23 mg/dL   Creatinine, Ser 5.81 (H) 0.44 - 1.00 mg/dL   Calcium 7.0 (L) 8.9 - 10.3 mg/dL   Phosphorus 6.4 (H) 2.5 - 4.6 mg/dL   Albumin 2.3 (L) 3.5 - 5.0 g/dL   GFR, Estimated 8 (L) >60 mL/min    Comment: (NOTE) Calculated using the CKD-EPI Creatinine Equation (2021)    Anion gap 19 (H) 5 - 15    Comment: Performed at [facility], [address], [place], [place] [address]  Culture, Respiratory w Gram Stain     Status: None (Preliminary result)   Collection Time: 11/14/20  8:17 AM   Specimen: Tracheal Aspirate; Respiratory  Result Value Ref Range   Specimen Description TRACHEAL ASPIRATE    Special Requests NONE    Gram Stain      ABUNDANT WBC PRESENT, PREDOMINANTLY PMN NO ORGANISMS SEEN    Culture      CULTURE REINCUBATED FOR BETTER GROWTH Performed at [facility], [place] [address], [place], [place] [address]    Report Status PENDING   Glucose, capillary     Status: Abnormal   Collection Time: 11/14/20 11:38 AM  Result Value Ref Range   Glucose-Capillary 247 (H) 70 - 99 mg/dL    Comment: Glucose reference range applies only to samples taken after fasting for at least 8 hours.  Lactic acid, plasma     Status: None   Collection Time: 11/14/20  2:13 PM  Result Value Ref Range   Lactic Acid, Venous 1.7 0.5 - 1.9 mmol/L    Comment: Performed at [facility] [address] [place],  [address]  Hemoglobin and hematocrit, blood     Status: Abnormal   Collection Time: 11/14/20  2:14 PM  Result Value Ref Range   Hemoglobin 8.8 (L) 12.0 - 15.0 g/dL   HCT 29.0 (L) 36.0 - 46.0 %    Comment: Performed at [facility], [place] [address], [place], [place] [address]  Glucose, capillary     Status: Abnormal   Collection Time: 11/14/20  3:27 PM  Result Value Ref Range   Glucose-Capillary 242 (H) 70 - 99 mg/dL    Comment: Glucose reference range applies only to  samples taken after fasting for at least 8 hours.  Glucose, capillary     Status: Abnormal   Collection Time: 11/14/20  8:17 PM  Result Value Ref Range   Glucose-Capillary 192 (H) 70 - 99 mg/dL    Comment: Glucose reference range applies only to samples taken after fasting for at least 8 hours.  Basic metabolic panel     Status: Abnormal   Collection Time: 11/14/20  9:46 PM  Result Value Ref Range   Sodium 129 (L) 135 - 145 mmol/L   Potassium 4.0 3.5 - 5.1 mmol/L   Chloride 94 (L) 98 - 111 mmol/L   CO2 17 (L) 22 - 32 mmol/L   Glucose, Bld 176 (H) 70 - 99 mg/dL    Comment: Glucose reference range applies only to samples taken after fasting for at least 8 hours.   BUN 34 (H) 8 - 23 mg/dL   Creatinine, Ser 6.83 (H) 0.44 - 1.00 mg/dL   Calcium 6.9 (L) 8.9 - 10.3 mg/dL   GFR, Estimated 6 (L) >60 mL/min    Comment: (NOTE) Calculated using the CKD-EPI Creatinine Equation (2021)    Anion gap 18 (H) 5 - 15    Comment: Performed at [facility] [address], [place], [place] [address]  Magnesium     Status: None   Collection Time: 11/14/20  9:46 PM  Result Value Ref Range   Magnesium 1.8 1.7 - 2.4 mg/dL    Comment: Performed at [facility], [place] [address], [place], [place] [address]  CBC     Status: Abnormal   Collection Time: 11/14/20  9:46 PM  Result Value Ref Range   WBC 29.4 (H) 4.0 - 10.5 K/uL   RBC 3.15 (L) 3.87 - 5.11 MIL/uL   Hemoglobin 9.1 (L) 12.0 - 15.0 g/dL   HCT 28.6 (L) 36.0 - 46.0 %   MCV 90.8 80.0 - 100.0 fL   MCH 28.9 26.0 - 34.0 pg   MCHC 31.8 30.0 - 36.0 g/dL   RDW 17.5 (H) 11.5 - 15.5 %   Platelets 194 150 - 400 K/uL   nRBC 0.1 0.0 - 0.2 %    Comment: Performed at [facility], [place] [address] [place], [place] [address]  Glucose, capillary     Status: Abnormal   Collection Time: 11/11/2020 12:17 AM  Result Value Ref Range   Glucose-Capillary 188 (H) 70 - 99 mg/dL    Comment: Glucose reference range applies only to samples taken after  fasting for at least 8 hours.  Glucose, capillary     Status: Abnormal   Collection Time: 11/10/2020  4:09 AM  Result Value Ref Range   Glucose-Capillary 163 (H) 70 - 99 mg/dL    Comment: Glucose reference range applies only to samples taken after fasting for at least 8 hours.    DG Abd 1 View  Result Date: 11/13/2020 CLINICAL DATA:  Orogastric tube placement. EXAM: ABDOMEN - 1 VIEW COMPARISON:  Abdomen and pelvis CT dated 08/27/2020 FINDINGS: Orogastric 2 tip in the mid to distal stomach and side hole in the proximal to mid stomach. Normal bowel gas pattern. Cholecystectomy clips. Dense atheromatous arterial calcifications. Mild dextroconvex scoliosis. IMPRESSION: Orogastric tube tip and side hole in the stomach. Electronically Signed   By: Claudie Revering M.D.   On: 11/13/2020 21:30  DG Chest Port 1 View  Result Date: 11/14/2020 CLINICAL DATA:  Respiratory failure with hypoxia EXAM: PORTABLE CHEST 1 VIEW COMPARISON:  Yesterday FINDINGS: Endotracheal tube terminates 5.3 cm above carina. Nasogastric tube extends beyond the inferior aspect of the film. Cholecystectomy clips. Midline trachea. Moderate cardiomegaly. Atherosclerosis in the transverse aorta. No pleural effusion or pneumothorax. Moderate diffuse interstitial thickening. No well-defined lobar consolidation. IMPRESSION: Similar appearance of cardiomegaly and interstitial thickening, likely related to clinical history of heart failure. Aortic Atherosclerosis (ICD10-I70.0). Electronically Signed   By: Abigail Miyamoto M.D.   On: 11/14/2020 08:46   DG Chest Port 1 View  Result Date: 11/13/2020 CLINICAL DATA:  Evaluate for endotracheal tube placement. EXAM: PORTABLE CHEST 1 VIEW COMPARISON:  March 29, 2020 FINDINGS: Endotracheal tube is identified with distal tip 4.8 cm from carina. There is no pneumothorax. The lungs are clear. The mediastinal contour and cardiac silhouette are normal. The osseous structures are stable. IMPRESSION: Endotracheal  tube identified in good position.  No pneumothorax. Electronically Signed   By: Abelardo Diesel M.D.   On: 11/13/2020 13:19    Review of Systems  Unable to perform ROS: Intubated  Blood pressure (!) 110/46, pulse 93, temperature 98 F (36.7 C), temperature source Axillary, resp. rate 13, height '5\' 5"'$  (1.651 m), weight 59.7 kg, SpO2 100 %. Physical Exam Constitutional:      General: She is not in acute distress.    Appearance: She is well-developed. She is not diaphoretic.  HENT:     Head: Normocephalic and atraumatic.  Eyes:     General: No scleral icterus.       Right eye: No discharge.        Left eye: No discharge.     Conjunctiva/sclera: Conjunctivae normal.  Cardiovascular:     Rate and Rhythm: Normal rate and regular rhythm.  Pulmonary:     Effort: Pulmonary effort is normal. No respiratory distress.  Musculoskeletal:     Comments: LLE 1cm superficial abrasion ant knee, scattered diffuse ecchymoses of various ages, no rash, KI in place  No knee or ankle effusion  Sens DPN, SPN, TN could not assess  Motor EHL, ext, flex, evers could not assess  DP 1+, PT 0, No significant edema  Skin:    General: Skin is warm and dry.  Psychiatric:     Comments: Sedated    Assessment/Plan: Left distal femur fx -- Plan ORIF today by Dr. Doreatha Martin.  Multiple medical problems including chronic systolic heart failure, type 2 diabetes with nephropathy, end-stage renal disease on HD MWF, hypertension, hyperlipidemia, and history of renal transplant -- per primary service    Sue Abu, PA-C Orthopedic Surgery [phone] 11/26/2020, 9:03 AM

## 2020-11-15 NOTE — Transfer of Care (Signed)
Immediate Anesthesia Transfer of Care Note  Patient: [name]  Procedure(s) Performed: OPEN REDUCTION INTERNAL FIXATION (ORIF) DISTAL FEMUR FRACTURE (Left)  Patient Location: ICU  Anesthesia Type:General  Level of Consciousness: unresponsive and Patient remains intubated per anesthesia plan  Airway & Oxygen Therapy: Patient remains intubated per anesthesia plan and Patient placed on Ventilator (see vital sign flow sheet for setting)  Post-op Assessment: Report given to RN and Post -op Vital signs reviewed and stable  Post vital signs: Reviewed  Last Vitals:  Vitals Value Taken Time  BP    Temp    Pulse    Resp    SpO2      Last Pain:  Vitals:   11/30/2020 0730  TempSrc: Axillary  PainSc:       Patients Stated Pain Goal: 3 ([id number])  Complications: No notable events documented.

## 2020-11-15 NOTE — Op Note (Signed)
Orthopaedic Surgery Operative Note (CSN: [id number] ) Date of Surgery: 11/26/2020  Admit Date: 11/11/2020   Diagnoses: Pre-Op Diagnoses: Left intra-articular distal femur fracture  Post-Op Diagnosis: Same  Procedures: CPT 27513-Open reduction internal fixation of left distal femur fracture  Surgeons : Primary: Shona Needles, MD  Assistant: Patrecia Pace, PA-C  Location: OR 3   Anesthesia:General   Antibiotics: Zosyn IV preop with 1gm vancomycin powder placed topically   Tourniquet time:None    Estimated Blood [id number] mL  Complications:None  Specimens:None  Implants: Implant Name Type Inv. Item Serial No. Manufacturer Lot No. LRB No. Used Action  CAP LOCK NCB - [id number] Cap CAP LOCK NCB  ZIMMER [employer]ON(ORTH,TRAU,BIO,SG)  Left 8 Implanted  PLATE DIST FEM 579FGE - [id number] Plate PLATE DIST FEM 579FGE  [employer](ORTH,TRAU,BIO,SG)  Left 1 Implanted  SCREW 5.0 70MM - [id number] Screw SCREW 5.0 70MM  [employer](ORTH,TRAU,BIO,SG)  Left 1 Implanted  SCREW 5.0 80MM - [id number] Screw SCREW 5.0 80MM  Z[employer]ON(ORTH,TRAU,BIO,SG)  Left 2 Implanted  SCREW NCB 5.0X38 - [id number] Screw SCREW NCB 5.0X38  Z[employer]ON(ORTH,TRAU,BIO,SG)  Left 1 Implanted  SCREW NCB 5.0X36MM - [id number] Screw SCREW NCB 5.0X36MM  ZIMMER [employer]ON(ORTH,TRAU,BIO,SG)  Left 1 Implanted  SCREW NCB 5.0X75MM - [id number] Screw SCREW NCB 5.0X75MM  ZIMMER [employer]ON(ORTH,TRAU,BIO,SG)  Left 2 Implanted     Indications for Surgery: 65 year old female who sustained a left intra-articular distal femur fracture.  Due to the unstable nature of injury I recommend proceeding with open reduction internal fixation.  Risks and benefits were discussed with patient's husband.  Risks included but not limited to bleeding, infection, malunion, nonunion, hardware failure, hardware irritation, nerve or blood vessel injury, posttraumatic arthritis, knee stiffness, even the possibility anesthetic complications.  He agreed to proceed with surgery and  consent was obtained.  Operative Findings: Open reduction internal fixation of left intra-articular distal femur fracture using [employer] NCB distal femoral locking plate.  Procedure: Patient was identified in the ICU.  Consent was confirmed with the husband.  The lower extremity was marked.  She was then brought back to the operating room by anesthesia colleagues.  She was carefully transferred over to a radiolucent flat top table.  A bump was placed under her operative hip.  The left lower extremity was prepped and draped in usual sterile fashion.  A timeout was performed to verify the patient, the procedure, and the extremity.  Preoperative antibiotics were dosed.  That the knee was flexed over a triangle.  Fluoroscopic imaging showed the unstable nature of her injury.  A lateral parapatellar incision was then made and carried down through skin and subcutaneous tissue.  IT band was incised in line with my incision.  I then performed a capsulotomy lateral patella.  I released and expose the lateral portion of the lateral condyle.  I then used as the retractors to visualize the intra-articular split.  I cleaned out the hematoma with a Cobb elevator to mobilize the fracture fragment.  I used a reduction tenaculum to anatomically reduce the intra-articular into medial condyle.  I then used 2.0 mm K wires placed laterally and brought out the medial skin to hold it provisionally while I removed the clamp.  I then attached a 12 hole [employer] NCB distal femoral locking plate to a jig and slid this submuscularly along the lateral aspect of the femur.  I held provisionally distally with a 2.0 mm K wire.  Proximally I aligned the plate with a percutaneous 3.3 mm drill bit.  Once I confirmed adequate positioning and reduction I then drilled and placed 5.0 millimeter screws in the distal segment to bring the plate flush to bone.  I then percutaneously placed 5.0 millimeter screws into the femoral shaft.  I  removed the 3.3 mm drill bit and placed a 4.0 millimeter screw.  Locking caps were placed on the 5.0 millimeter screws.  The targeting arm was removed.  I then placed 4 more 5.0 millimeter screws in the articular block.  Locking caps were placed on all of the screws.  The K wires were removed.  Final fluoroscopic imaging was obtained.  The incision was copiously irrigated.  A gram of vancomycin powder was placed into the incision.  A layered closure of 0 Vicryl, 2-0 Vicryl and 3-0 nylon was used to close the skin.  Sterile dressings were applied.  The patient was left intubated and taken to the ICU in stable condition.  Post Op Plan/Instructions: Patient will be touchdown weightbearing to left lower extremity.  She has no range of motion restrictions.  She will receive postoperative antibiotics for which she is receiving Zosyn which should cover her surgical prophylaxis.  She may be restarted on Eliquis once cleared from a medical perspective.  I was present and performed the entire surgery.  Patrecia Pace, PA-C did assist me throughout the case. An assistant was necessary given the difficulty in approach, maintenance of reduction and ability to instrument the fracture.   Katha Hamming, MD [facility]

## 2020-11-15 NOTE — Anesthesia Preprocedure Evaluation (Addendum)
Anesthesia Evaluation  Patient identified by MRN, date of birth, ID band Patient unresponsive    Reviewed: Allergy & Precautions, NPO status , Patient's Chart, lab work & pertinent test results  Airway Mallampati: Intubated      Comment: Intubated Dental   Pulmonary pneumonia,  Possible aspiration Intubated      + intubated    Cardiovascular hypertension, Pt. on medications pulmonary hypertension+ CAD and +CHF  Normal cardiovascular exam+ dysrhythmias Atrial Fibrillation + Valvular Problems/Murmurs (PFO)      Neuro/Psych PSYCHIATRIC DISORDERS Depression negative neurological ROS     GI/Hepatic negative GI ROS, Neg liver ROS,   Endo/Other  diabetes, Insulin DependentHyponatremia   Renal/GU ESRF and DialysisRenal disease (last dialyzed 11/13/20)     Musculoskeletal   Abdominal   Peds  Hematology  (+) anemia , HLD   Anesthesia Other Findings Left distal femur fracture  Reproductive/Obstetrics                           Anesthesia Physical Anesthesia Plan  ASA: 4  Anesthesia Plan: General   Post-op Pain Management:    Induction:   PONV Risk Score and Plan: 3 and Treatment may vary due to age or medical condition  Airway Management Planned: Oral ETT  Additional Equipment:   Intra-op Plan:   Post-operative Plan: Post-operative intubation/ventilation  Informed Consent:   Plan Discussed with: CRNA  Anesthesia Plan Comments: (On fentanyl gtt. On Norepi gtt. Existing ETT. )      Anesthesia Quick Evaluation

## 2020-11-16 ENCOUNTER — Encounter (HOSPITAL_COMMUNITY): Payer: Self-pay | Admitting: Student

## 2020-11-16 DIAGNOSIS — R579 Shock, unspecified: Secondary | ICD-10-CM | POA: Diagnosis not present

## 2020-11-16 LAB — HEMOGLOBIN AND HEMATOCRIT, BLOOD
HCT: 23 % — ABNORMAL LOW (ref 36.0–46.0)
Hemoglobin: 7.3 g/dL — ABNORMAL LOW (ref 12.0–15.0)

## 2020-11-16 LAB — ACTH STIMULATION, 3 TIME POINTS
Cortisol, 30 Min: 15.5 ug/dL
Cortisol, Base: 10.2 ug/dL

## 2020-11-16 LAB — VITAMIN D 25 HYDROXY (VIT D DEFICIENCY, FRACTURES): Vit D, 25-Hydroxy: 9.76 ng/mL — ABNORMAL LOW (ref 30–100)

## 2020-11-16 LAB — GLUCOSE, CAPILLARY
Glucose-Capillary: 190 mg/dL — ABNORMAL HIGH (ref 70–99)
Glucose-Capillary: 199 mg/dL — ABNORMAL HIGH (ref 70–99)
Glucose-Capillary: 217 mg/dL — ABNORMAL HIGH (ref 70–99)
Glucose-Capillary: 236 mg/dL — ABNORMAL HIGH (ref 70–99)
Glucose-Capillary: 243 mg/dL — ABNORMAL HIGH (ref 70–99)
Glucose-Capillary: 244 mg/dL — ABNORMAL HIGH (ref 70–99)
Glucose-Capillary: 261 mg/dL — ABNORMAL HIGH (ref 70–99)

## 2020-11-16 LAB — MAGNESIUM: Magnesium: 2 mg/dL (ref 1.7–2.4)

## 2020-11-16 LAB — PHOSPHORUS: Phosphorus: 9.7 mg/dL — ABNORMAL HIGH (ref 2.5–4.6)

## 2020-11-16 LAB — PREPARE RBC (CROSSMATCH)

## 2020-11-16 MED ORDER — INSULIN GLARGINE-YFGN 100 UNIT/ML ~~LOC~~ SOLN
10.0000 [IU] | Freq: Every day | SUBCUTANEOUS | Status: DC
  Administered 2020-11-17: 10 [IU] via SUBCUTANEOUS
  Filled 2020-11-16 (×3): qty 0.1

## 2020-11-16 MED ORDER — ALBUTEROL SULFATE (2.5 MG/3ML) 0.083% IN NEBU
2.5000 mg | INHALATION_SOLUTION | RESPIRATORY_TRACT | Status: DC | PRN
  Administered 2020-11-16: 2.5 mg via RESPIRATORY_TRACT

## 2020-11-16 MED ORDER — SODIUM CHLORIDE 0.9% IV SOLUTION: Freq: Once | INTRAVENOUS | Status: AC

## 2020-11-16 MED ORDER — COSYNTROPIN 0.25 MG IJ SOLR
0.2500 mg | Freq: Once | INTRAMUSCULAR | Status: AC
  Administered 2020-11-16: 0.25 mg via INTRAVENOUS
  Filled 2020-11-16: qty 0.25

## 2020-11-16 MED ORDER — SODIUM CHLORIDE 0.9 % IV SOLN
20.0000 ug | Freq: Once | INTRAVENOUS | Status: AC
  Administered 2020-11-16: 20 ug via INTRAVENOUS
  Filled 2020-11-16: qty 5

## 2020-11-16 MED ORDER — ALBUTEROL SULFATE (2.5 MG/3ML) 0.083% IN NEBU
INHALATION_SOLUTION | RESPIRATORY_TRACT | Status: AC
  Filled 2020-11-16: qty 3

## 2020-11-16 NOTE — Progress Notes (Signed)
Orthopaedic Trauma Progress Note  SUBJECTIVE: patient remains intubated. Does open eyes to voice. Not able to follow all commands. Patient noted to have significant drainage from dressing overnight. Thrombin pad and pressure dressing applied and this morning the current dressing appears clean and dry. No family at bedside currently.   OBJECTIVE:  Vitals:   11/16/20 0645 11/16/20 0700  BP: 122/66 (!) 98/56  Pulse: 96 (!) 108  Resp:    Temp:  98 F (36.7 C)  SpO2: 100% 100%    General: Intubated. Opens eyes.  Respiratory: No increased work of breathing.  LLE: Dressing in place is currently CDI. Did not remove this. Foot slightly cool but equal to contralateral side. Motor and sensory function limited secondary to intubation but patient does acknowledge sensation to light touch of the foot. Unable to follow motor function commands.   IMAGING: Stable post op imaging.   LABS:  Results for orders placed or performed during the hospital encounter of 11/13/2020 (from the past 24 hour(s))  Glucose, capillary     Status: Abnormal   Collection Time: 12/02/2020 11:13 AM  Result Value Ref Range   Glucose-Capillary 167 (H) 70 - 99 mg/dL  BLOOD TRANSFUSION REPORT - SCANNED     Status: None   Collection Time: 11/28/2020 12:19 PM   Narrative   Ordered by an unspecified provider.  Glucose, capillary     Status: Abnormal   Collection Time: 11/14/2020  3:29 PM  Result Value Ref Range   Glucose-Capillary 221 (H) 70 - 99 mg/dL  Phosphorus     Status: Abnormal   Collection Time: 11/13/2020  5:00 PM  Result Value Ref Range   Phosphorus 9.5 (H) 2.5 - 4.6 mg/dL  Glucose, capillary     Status: Abnormal   Collection Time: 11/28/2020  7:53 PM  Result Value Ref Range   Glucose-Capillary 250 (H) 70 - 99 mg/dL  CBC     Status: Abnormal   Collection Time: 11/08/2020  8:32 PM  Result Value Ref Range   WBC 31.1 (H) 4.0 - 10.5 K/uL   RBC 2.62 (L) 3.87 - 5.11 MIL/uL   Hemoglobin 7.5 (L) 12.0 - 15.0 g/dL   HCT 24.1 (L)  36.0 - 46.0 %   MCV 92.0 80.0 - 100.0 fL   MCH 28.6 26.0 - 34.0 pg   MCHC 31.1 30.0 - 36.0 g/dL   RDW 17.6 (H) 11.5 - 15.5 %   Platelets 198 150 - 400 K/uL   nRBC 0.1 0.0 - 0.2 %  Basic metabolic panel     Status: Abnormal   Collection Time: 12/03/2020  8:32 PM  Result Value Ref Range   Sodium 128 (L) 135 - 145 mmol/L   Potassium 4.1 3.5 - 5.1 mmol/L   Chloride 93 (L) 98 - 111 mmol/L   CO2 18 (L) 22 - 32 mmol/L   Glucose, Bld 265 (H) 70 - 99 mg/dL   BUN 46 (H) 8 - 23 mg/dL   Creatinine, Ser 7.98 (H) 0.44 - 1.00 mg/dL   Calcium 6.9 (L) 8.9 - 10.3 mg/dL   GFR, Estimated 5 (L) >60 mL/min   Anion gap 17 (H) 5 - 15  Magnesium     Status: None   Collection Time: 11/27/2020  8:32 PM  Result Value Ref Range   Magnesium 2.0 1.7 - 2.4 mg/dL  Protime-INR     Status: Abnormal   Collection Time: 11/28/2020 10:43 PM  Result Value Ref Range   Prothrombin Time 16.6 (H) 11.4 -  15.2 seconds   INR 1.3 (H) 0.8 - 1.2  APTT     Status: Abnormal   Collection Time: 11/28/2020 10:43 PM  Result Value Ref Range   aPTT 37 (H) 24 - 36 seconds  Glucose, capillary     Status: Abnormal   Collection Time: 11/16/20 12:06 AM  Result Value Ref Range   Glucose-Capillary 261 (H) 70 - 99 mg/dL  Magnesium     Status: None   Collection Time: 11/16/20  1:37 AM  Result Value Ref Range   Magnesium 2.0 1.7 - 2.4 mg/dL  Phosphorus     Status: Abnormal   Collection Time: 11/16/20  1:37 AM  Result Value Ref Range   Phosphorus 9.7 (H) 2.5 - 4.6 mg/dL  Hemoglobin and hematocrit, blood     Status: Abnormal   Collection Time: 11/16/20  1:37 AM  Result Value Ref Range   Hemoglobin 7.3 (L) 12.0 - 15.0 g/dL   HCT 23.0 (L) 36.0 - 46.0 %  Glucose, capillary     Status: Abnormal   Collection Time: 11/16/20  4:24 AM  Result Value Ref Range   Glucose-Capillary 236 (H) 70 - 99 mg/dL  Glucose, capillary     Status: Abnormal   Collection Time: 11/16/20  7:52 AM  Result Value Ref Range   Glucose-Capillary 199 (H) 70 - 99 mg/dL     ASSESSMENT: [name] is a 65 y.o. female, 1 Day Post-Op s/p ORIF LEFT DISTAL FEMUR FRACTURE  CV/Blood loss: Acute blood loss anemia, Hgb 7.3 this AM.   PLAN: Weightbearing: TDWB LLE Incisional and dressing care: Reinforce dressings as needed. Plan to remove and apply new dressing tomorrow.  Showering: Bed bath Orthopedic device(s):  None Pain management: per primary team VTE prophylaxis:  Hold chemical prophylaxis for now , SCDs ID: On Zosyn per primary team.   Foley/Lines:  No foley, KVO IVFs Impediments to Fracture Healing: Vit D level pending, will start supplementation as indicated Dispo: Intubated currently. PT/OT once able. LLE dressing currently clean/dry/intact, we will leave this in place for now. Continue to reinforce PRN today. Plan to remove and evaluate tomorrow.  Follow - up plan:  TBD  Contact information:  Katha Hamming MD, Patrecia Pace PA-C. After hours and holidays please check [email or web address] for group call information for [facility]   Lakeyn Dokken A. Ricci Barker, PA-C [phone] (office) [email or web address]

## 2020-11-16 NOTE — Progress Notes (Signed)
[facility] NEPHROLOGY PROGRESS NOTE  Assessment/ Plan: Pt is a 65 y.o. yo female  with history of hypertension, HLD, type II DM, CHF, ESRD on HD MWF, h/o failed kidney transplant who presented to the [facility] with a left knee pain after a fall, seen as a consult for the management of ESRD.  She developed respiratory distress requiring intubation and transferred to [facility] ICU.  [facility]-  orders are as follows  MWF-  4 hours AVF-  16 gauge- 300 BFR 2/2.5 bath/300 rev/EDW 68.5-  no heparin Calcitriol 0.75 TIW, epo 12,000 TIW and venofer 50 weekly   #Comminuted distal left femur fracture after the fall: ortho on board, s/p left ORIF 9/12   # ESRD: MWF at [facility]: will plan for HD vs CRRT tomorrow, volume status acceptable, hopefully can try for HD provided she is coming off pressors  #Vascular access: clotted AVF, IF has already been consulted for possible fgram and TDC placement (possibly today). Has a right fem temp line in the meantime   #Hyperkalemia: Improved after HD.   #Hypotension/shock, intradialytic hypotension.  Required midodrine, albumin during HD.  Currently she is on Levophed IV, titrating down   # Anemia of ESRD: Probably some blood loss.  Receiving blood transfusion.  No iron because of infection and on antibiotics.  Continue ESA.  # Metabolic Bone Disease: Monitor calcium phosphorus level.  Resume calcitriol, Renvela when able to take orally.  Subjective: Seen and examined ICU. Was informed of patient being in [facility] today.  She is currently intubated, sedated.  She is able to open eyes. On 40% fio2 and on levophed.  Discussed with ICU team and RN Objective Vital signs in last 24 hours: Vitals:   11/16/20 1200 11/16/20 1300 11/16/20 1400 11/16/20 1415  BP: (!) 128/50 (!) 131/49 (!) 120/50 (!) 113/45  Pulse: 97 96 94 97  Resp:      Temp:   98.1 F (36.7 C)   TempSrc:   Axillary   SpO2: 100% 100% 100% 100%  Weight:      Height:       Weight  change: 10.9 kg  Intake/Output Summary (Last 24 hours) at 11/16/2020 1439 Last data filed at 11/16/2020 1300 Gross per 24 hour  Intake 1519.48 ml  Output 100 ml  Net 1419.48 ml       Labs: Basic Metabolic Panel: Recent Labs  Lab 11/14/20 0746 11/14/20 2146 12/03/2020 1700 11/11/2020 2032 11/16/20 0137  NA 132* 129*  --  128*  --   K 3.8 4.0  --  4.1  --   CL 96* 94*  --  93*  --   CO2 17* 17*  --  18*  --   GLUCOSE 184* 176*  --  265*  --   BUN 27* 34*  --  46*  --   CREATININE 5.81* 6.83*  --  7.98*  --   CALCIUM 7.0* 6.9*  --  6.9*  --   PHOS 6.4*  --  9.5*  --  9.7*   Liver Function Tests: Recent Labs  Lab 11/13/2020 1015 11/14/20 0401 11/14/20 0746  AST 27  --   --   ALT 34  --   --   ALKPHOS 101  --   --   BILITOT 0.5  --   --   PROT 6.6  --   --   ALBUMIN 2.7* TEST WILL BE CREDITED 2.3*   No results for input(s): LIPASE, AMYLASE in  the last 168 hours. No results for input(s): AMMONIA in the last 168 hours. CBC: Recent Labs  Lab 11/25/2020 1015 11/13/20 0548 11/14/20 0400 11/14/20 0745 11/14/20 1414 11/14/20 2146 12/01/2020 2032 11/16/20 0137  WBC 19.2* 16.6* TEST WILL BE CREDITED 32.1*  --  29.4* 31.1*  --   NEUTROABS 16.1*  --   --   --   --   --   --   --   HGB 7.4* 6.4* TEST WILL BE CREDITED 7.5*   < > 9.1* 7.5* 7.3*  HCT 25.0* 22.3* TEST WILL BE CREDITED 24.5*   < > 28.6* 24.1* 23.0*  MCV 96.5 97.4 TEST WILL BE CREDITED 94.6  --  90.8 92.0  --   PLT 227 184 TEST WILL BE CREDITED 199  --  194 198  --    < > = values in this interval not displayed.   Cardiac Enzymes: No results for input(s): CKTOTAL, CKMB, CKMBINDEX, TROPONINI in the last 168 hours. CBG: Recent Labs  Lab 11/08/2020 1953 11/16/20 0006 11/16/20 0424 11/16/20 0752 11/16/20 1135  GLUCAP 250* 261* 236* 199* 217*    Iron Studies:  Recent Labs    11/14/20 0745  IRON 19*  TIBC 129*  FERRITIN 1,392*   Studies/Results: DG Knee Left Port  Result Date: 11/16/2020 CLINICAL DATA:   Status post left knee surgery. EXAM: PORTABLE LEFT KNEE - 1-2 VIEW COMPARISON:  Left femur x-ray 11/29/2020. FINDINGS: There is a new lateral sideplate with multiple screws screws fixating a distal femoral fracture. Alignment is anatomic. With recent surgery. Vascular calcifications are noted in the soft tissues. There is soft tissue swelling and air in the soft tissues compatible IMPRESSION: 1. ORIF distal femoral fracture in anatomic alignment. Electronically Signed   By: Ronney Asters M.D.   On: 11/09/2020 18:44   DG C-Arm 1-60 Min-No Report  Result Date: 11/14/2020 Fluoroscopy was utilized by the requesting physician.  No radiographic interpretation.   DG FEMUR MIN 2 VIEWS LEFT  Result Date: 11/13/2020 CLINICAL DATA:  Left femoral ORIF, intraoperative examination EXAM: LEFT FEMUR 2 VIEWS COMPARISON:  11/24/2020 FINDINGS: Six fluoroscopic intraoperative radiographs of the left distal femur demonstrates open reduction and internal fixation of a comminuted distal femoral fracture utilizing a lateral plate and multiple cortical screws fracture fragments appear in grossly anatomic alignment on final images. No dislocation. Advanced vascular calcifications noted. Fluoroscopy time: 50 seconds Dose: 1.48 mGy Images: 6 IMPRESSION: Left distal femoral ORIF as described above Electronically Signed   By: Fidela Salisbury M.D.   On: 11/26/2020 16:12    Medications: Infusions:  sodium chloride     desmopressin (DDAVP) IV for Bleeding     feeding supplement (VITAL 1.5 CAL) Stopped (11/16/20 0000)   fentaNYL infusion INTRAVENOUS Stopped (11/16/20 1145)   norepinephrine (LEVOPHED) Adult infusion 8 mcg/min (11/16/20 1300)   piperacillin-tazobactam (ZOSYN)  IV Stopped (11/16/20 0617)    Scheduled Medications:  sodium chloride   Intravenous Once   amiodarone  100 mg Per Tube Daily   ARIPiprazole  5 mg Per Tube QHS   aspirin  81 mg Per Tube Daily   calcitRIOL  0.75 mcg Per Tube Q T,Th,Sat-1800   calcium  acetate  667 mg Oral TID WC   chlorhexidine gluconate (MEDLINE KIT)  15 mL Mouth Rinse BID   Chlorhexidine Gluconate Cloth  6 each Topical Q0600   darbepoetin (ARANESP) injection - DIALYSIS  40 mcg Intravenous Once   docusate  100 mg Per Tube BID  feeding supplement (PROSource TF)  45 mL Per Tube Daily   insulin aspart  0-6 Units Subcutaneous Q4H   insulin glargine-yfgn  10 Units Subcutaneous Daily   mouth rinse  15 mL Mouth Rinse 10 times per day   metoCLOPramide  5 mg Per Tube TID AC   multivitamin  1 tablet Per Tube Daily   pantoprazole (PROTONIX) IV  40 mg Intravenous Q24H   polyethylene glycol  17 g Per Tube Daily   predniSONE  5 mg Per Tube Q breakfast   senna  1 tablet Per Tube BID   sertraline  100 mg Per Tube QHS   sevelamer carbonate  3,200 mg Per Tube TID with meals   sodium chloride flush  10-40 mL Intracatheter Q12H   sodium chloride flush  3 mL Intravenous Q12H   Thrombi-Pad  2 each Topical Once    have reviewed scheduled and prn medications.  Physical Exam: General: Ill-looking female intubated, sedated. Heart:RRR, s1s2 nl Lungs: cta bl Abdomen:soft,  non-distended Extremities:No edema Neuro: sedated, does open eyes intermittently Dialysis Access: Right groin catheter in place, left upper extremity AV fistula has no bruit or thrill.  Valentine Kuechle 11/16/2020,2:39 PM  LOS: 4 days

## 2020-11-16 NOTE — Progress Notes (Signed)
Patient seen and examined. Will hold off on further stitches/staples at this time. I have taken off mepilex dressings and applied reinforced dressing of gauze, abd's. Kerlix, and ace bandages. RN will reinforce tonight if needed.   Merlene Pulling, PA-C

## 2020-11-16 NOTE — Progress Notes (Signed)
NAME:  KRISTYL FANTA, MRN:  [id number], DOB:  [birth year], LOS: 4 ADMISSION DATE:  11/14/2020, CONSULTATION DATE: 11/13/2020 REFERRING MD: Heath Lark DO, CHIEF COMPLAINT:  L femur fracture  History of Present Illness:  65 year old with hypertension, hyperlipidemia, diabetes, afib, systolic and diastolic CHF, end-stage renal disease s/p failed transplant presenting with left distal femur fracture after a fall.  Ortho is planning for OR intervention on 9/12 at [facility]  Attempted hemodialysis on 9/9 but aborted due to clotting of AV fistula, unable to return 300 cc of blood. She then had a femoral catheter placed and dialyzed today 9/10 with 1 unit of blood due to blood loss anemia.  During dialysis she developed acute respiratory failure, possible aspiration and emergently intubated.    PCCM consulted for transfer to [facility] for further management  Pertinent  Medical History   Past Medical History:  Diagnosis Date   Abnormal CXR (chest x-ray) 05/25/2019   Abnormal LFTs (liver function tests) 03/11/2018   Acute hypoxemic respiratory failure ([place]) 12/16/2018   Acute on chronic combined systolic and diastolic CHF (congestive heart failure) -EF 40 to 45 % 08/10/2019   Acute respiratory failure with hypoxia ([place]) 12/02/2018   Anemia    of chronic disease   Blood transfusion without reported diagnosis    Cardiovascular disease    nonobstructive   Carotid artery stenosis 2008   CHF (congestive heart failure) ([place])    Coronary artery disease    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   Last Assessment & Plan:  - Continuing home suppression therapy: Bactrim M-W-F, Prednisone 5 mg daily, Myfortic 360 mg BID and Prograf 1 mg BID - Appreciate Nephrology recs   Diabetes mellitus    Dyslipidemia    Edema of lower extremity    with hypo-albuminemia and profound protenuria   HCAP (healthcare-associated pneumonia)    Heart murmur    Hypertension    Lung collapse 12/15/2018   Mitral  regurgitation    Nephrotic syndrome    Patent foramen ovale    Pulmonary edema 08/10/2019   Pulmonary hypertension, moderate to severe [facility])    Pulmonary nodule    Renal insufficiency    Tricuspid regurgitation    Volume depletion, renal, due to output loss (renal deficit)   Afib rvr 07/2020 on amiodarone  Significant Hospital Events: Including procedures, antibiotic start and stop dates in addition to other pertinent events   9/9 Admitted after a fall with femur fracture, HD catheter placed due to a clotted AV fistula 9/10 intubated for acute respiratory failure, aspiration 9/11: Transferred to [facility]U 9/12: ORIF 9/13: Plan for IR AV fistula interrogation today  Interim History / Subjective:  Patient remains intubated and on fentanyl drip. Requiring increased dose of Levophed. Opens eyes and nods but does not follow commands. Triggers the ventilator  Objective   Blood pressure (!) 98/56, pulse (!) 108, temperature 98 F (36.7 C), temperature source Axillary, resp. rate (!) 7, height '5\' 5"'$  (1.651 m), weight 70.6 kg, SpO2 100 %.    Vent Mode: PRVC FiO2 (%):  [40 %] 40 % Set Rate:  [14 bmp] 14 bmp Vt Set:  [450 mL] 450 mL PEEP:  [5 cmH20] 5 cmH20 Plateau Pressure:  [10 cmH20-15 cmH20] 15 cmH20   Intake/Output Summary (Last 24 hours) at 11/16/2020 0853 Last data filed at 11/16/2020 0700 Gross per 24 hour  Intake 1337.89 ml  Output 100 ml  Net 1237.89 ml   Filed Weights   11/14/20 0230 11/28/2020 0422  11/16/20 0443  Weight: 69.8 kg 59.7 kg 70.6 kg    Examination:  General:  critically ill appearing on mech vent, opens eyes to voice  HEENT: MM pink/moist; ETT in place Neuro: only on fentynl drip, opens eyes to voice but does not follow commands, nods to questions CV: s1s2, RRR, no m/r/g PULM:  lung apices clear BS bilaterally; on mech vent PRVC 40%, triggers ventilator GI: soft, non tender, non distended Extremities: warm/dry, no edema; bandage on left leg  Resolved  Hospital Problem list     Assessment & Plan:  Fall, left femur fracture -per ortho -dressing in place  Acute respiratory failure w/ hypoxia Possible aspiration pneumonia WBC count increased to 31 with significant WBCs in trach aspirate. Culture with occasional staph aures. Preincubating and waiting results. As remained afebrile. -continue mechanical ventilation with plan to extubate after IR intervention -VAP prevention in place -wean fio2 for sats >90% -daily sbt/sat. Plan to likely extubate post IR intervention -f/u trach aspirate culture -continue zosyn for possible aspiration for 5 days (9/12 - 9/16) -trend CBC/fever curve  Acute Blood Loss Anemia Shock: likely hypovolemic/ vasoplegic post surgery though given increasing WBC, spesis is not ruled out. S/p 2 units PRBC and 1L fluids. - give another unit of PRBCs today given persistent hypotension - wean levophed to map > 65 - transfuse for hgb <7 -infectious workup as above  Acute encephalopathy secondary to intradialytic hypotension -Improving with supportive care. Will likely attempt extubation after IR intervention  ESRD: on HD MWF S/p failed renal transplant Hyperkalemia- Resolved with diaylsis Hyperphosphoremia -nephro following -Failed dialysis 9/9 due to clotted AV fistula -Plan for IR intervention today -Monitor BMP - Per nephro: resume calcitriol and renvela when able to tolerate PO - holding prograf for now, awaiting nephrology consultation - continue home prednisone  Chronic systolic and diastolic CHF: LVEF [id number] - caution with additional IV fluids -hold home metoprolol  Hx HTN and HLD -continue statin -holding home meds in setting of hypotension  Hx of afib -continue amiodarone -holding eliquis until cleared by surgery -holding metoprolol in setting of hypotension  T2DM w/ nephropathy and gastroparesis -SSI and CBG monitoring -continue reglan  Hx of generalized anxiety disorder P: -continue  abilify and zoloft -will hold mirtazapine until extubated  Best Practice (right click and "Reselect all SmartList Selections" daily)   Diet/type: NPO w/ meds via tube DVT prophylaxis: SCD; holding home eliquis until cleared by surgery 9/12 GI prophylaxis: PPI Lines: Dialysis Catheter Foley:  N/A Code Status:  full code Last date of multidisciplinary goals of care discussion [pending]  Labs   CBC: Recent Labs  Lab 11/18/2020 1015 11/13/20 0548 11/14/20 0400 11/14/20 0745 11/14/20 1414 11/14/20 2146 11/08/2020 2032 11/16/20 0137  WBC 19.2* 16.6* TEST WILL BE CREDITED 32.1*  --  29.4* 31.1*  --   NEUTROABS 16.1*  --   --   --   --   --   --   --   HGB 7.4* 6.4* TEST WILL BE CREDITED 7.5* 8.8* 9.1* 7.5* 7.3*  HCT 25.0* 22.3* TEST WILL BE CREDITED 24.5* 29.0* 28.6* 24.1* 23.0*  MCV 96.5 97.4 TEST WILL BE CREDITED 94.6  --  90.8 92.0  --   PLT 227 184 TEST WILL BE CREDITED 199  --  194 198  --     Basic Metabolic Panel: Recent Labs  Lab 11/13/20 0548 11/14/20 0401 11/14/20 0745 11/14/20 0746 11/14/20 2146 11/06/2020 1700 11/29/2020 2032 11/16/20 0137  NA 140 TEST WILL BE  CREDITED  --  132* 129*  --  128*  --   K 5.9* TEST WILL BE CREDITED  --  3.8 4.0  --  4.1  --   CL 102 TEST WILL BE CREDITED  --  96* 94*  --  93*  --   CO2 18* TEST WILL BE CREDITED  --  17* 17*  --  18*  --   GLUCOSE 153* TEST WILL BE CREDITED  --  184* 176*  --  265*  --   BUN 92* TEST WILL BE CREDITED  --  27* 34*  --  46*  --   CREATININE 13.54* TEST WILL BE CREDITED  --  5.81* 6.83*  --  7.98*  --   CALCIUM 7.7* TEST WILL BE CREDITED  --  7.0* 6.9*  --  6.9*  --   MG 2.1 TEST WILL BE CREDITED 1.7  --  1.8  --  2.0 2.0  PHOS  --  TEST WILL BE CREDITED  --  6.4*  --  9.5*  --  9.7*   GFR: Estimated Creatinine Clearance: 6.8 mL/min (A) (by C-G formula based on SCr of 7.98 mg/dL (H)). Recent Labs  Lab 11/13/20 1330 11/14/20 0400 11/14/20 0510 11/14/20 0745 11/14/20 1413 11/14/20 2146  12/03/2020 2032  PROCALCITON 3.92  --   --   --   --   --   --   WBC  --  TEST WILL BE CREDITED  --  32.1*  --  29.4* 31.1*  LATICACIDVEN  --   --  2.3*  --  1.7  --   --     Liver Function Tests: Recent Labs  Lab 11/21/2020 1015 11/14/20 0401 11/14/20 0746  AST 27  --   --   ALT 34  --   --   ALKPHOS 101  --   --   BILITOT 0.5  --   --   PROT 6.6  --   --   ALBUMIN 2.7* TEST WILL BE CREDITED 2.3*   No results for input(s): LIPASE, AMYLASE in the last 168 hours. No results for input(s): AMMONIA in the last 168 hours.  ABG    Component Value Date/Time   PHART 7.421 11/13/2020 2309   PCO2ART 28.4 (L) 11/13/2020 2309   PO2ART 147 (H) 11/13/2020 2309   HCO3 20.4 11/13/2020 2309   TCO2 28 07/20/2019 1354   ACIDBASEDEF 5.5 (H) 11/13/2020 2309   O2SAT 98.7 11/13/2020 2309     Coagulation Profile: Recent Labs  Lab 11/30/2020 2243  INR 1.3*    Cardiac Enzymes: No results for input(s): CKTOTAL, CKMB, CKMBINDEX, TROPONINI in the last 168 hours.  HbA1C: HB A1C (BAYER DCA - WAIVED)  Date/Time Value Ref Range Status  10/19/2020 09:41 AM 6.7 <7.0 % Final    Comment:                                          Diabetic Adult            <7.0                                       Healthy Adult        4.3 - 5.7                                                           (  DCCT/NGSP) American Diabetes Association's Summary of Glycemic Recommendations for Adults with Diabetes: Hemoglobin A1c <7.0%. More stringent glycemic goals (A1c <6.0%) may further reduce complications at the cost of increased risk of hypoglycemia.   10/01/2018 11:48 AM 9.7 (H) <7.0 % Final    Comment:                                          Diabetic Adult            <7.0                                       Healthy Adult        4.3 - 5.7                                                           (DCCT/NGSP) American Diabetes Association's Summary of Glycemic Recommendations for Adults with Diabetes: Hemoglobin A1c  <7.0%. More stringent glycemic goals (A1c <6.0%) may further reduce complications at the cost of increased risk of hypoglycemia.    Hgb A1c MFr Bld  Date/Time Value Ref Range Status  12/03/2020 10:15 AM 6.7 (H) 4.8 - 5.6 % Final    Comment:    (NOTE) Pre diabetes:          5.7%-6.4%  Diabetes:              >6.4%  Glycemic control for   <7.0% adults with diabetes   01/06/2020 04:34 PM 8.7 (H) 4.8 - 5.6 % Final    Comment:    (NOTE) Pre diabetes:          5.7%-6.4%  Diabetes:              >6.4%  Glycemic control for   <7.0% adults with diabetes     CBG: Recent Labs  Lab 11/06/2020 1529 11/28/2020 1953 11/16/20 0006 11/16/20 0424 11/16/20 0752  GLUCAP 221* 250* 261* 236* 199*      Critical care time:     Reece Agar, MS4

## 2020-11-16 NOTE — Progress Notes (Signed)
Pt bleeding through surgical bandages.  Soaking through multiple gauze, abd, kerlix, and ace bandages. Attempted to treat site with thrombipad and hold pressure, site continues to soak through approximately every 2 hours. 1 unit of PRBC's given, CCM MD at bedside, DDAVP ordered. Ortho Paged.

## 2020-11-17 DIAGNOSIS — R579 Shock, unspecified: Secondary | ICD-10-CM | POA: Diagnosis not present

## 2020-11-17 LAB — COMPREHENSIVE METABOLIC PANEL
ALT: 8 U/L (ref 0–44)
AST: 21 U/L (ref 15–41)
Albumin: 1.6 g/dL — ABNORMAL LOW (ref 3.5–5.0)
Alkaline Phosphatase: 60 U/L (ref 38–126)
Anion gap: 19 — ABNORMAL HIGH (ref 5–15)
BUN: 58 mg/dL — ABNORMAL HIGH (ref 8–23)
CO2: 18 mmol/L — ABNORMAL LOW (ref 22–32)
Calcium: 7 mg/dL — ABNORMAL LOW (ref 8.9–10.3)
Chloride: 88 mmol/L — ABNORMAL LOW (ref 98–111)
Creatinine, Ser: 8.93 mg/dL — ABNORMAL HIGH (ref 0.44–1.00)
GFR, Estimated: 4 mL/min — ABNORMAL LOW (ref >60)
Glucose, Bld: 204 mg/dL — ABNORMAL HIGH (ref 70–99)
Potassium: 4.3 mmol/L (ref 3.5–5.1)
Sodium: 125 mmol/L — ABNORMAL LOW (ref 135–145)
Total Bilirubin: 1.2 mg/dL (ref 0.3–1.2)
Total Protein: 5.1 g/dL — ABNORMAL LOW (ref 6.5–8.1)

## 2020-11-17 LAB — TYPE AND SCREEN
ABO/RH(D): A NEG
Antibody Screen: NEGATIVE
Unit division: 0
Unit division: 0

## 2020-11-17 LAB — BPAM RBC
Blood Product Expiration Date: 202209272359
Blood Product Expiration Date: 202210082359
ISSUE DATE / TIME: 202209110845
ISSUE DATE / TIME: 202209131350
Unit Type and Rh: 600
Unit Type and Rh: 600

## 2020-11-17 LAB — RENAL FUNCTION PANEL
Albumin: 1.7 g/dL — ABNORMAL LOW (ref 3.5–5.0)
Anion gap: 20 — ABNORMAL HIGH (ref 5–15)
BUN: 52 mg/dL — ABNORMAL HIGH (ref 8–23)
CO2: 17 mmol/L — ABNORMAL LOW (ref 22–32)
Calcium: 6.6 mg/dL — ABNORMAL LOW (ref 8.9–10.3)
Chloride: 90 mmol/L — ABNORMAL LOW (ref 98–111)
Creatinine, Ser: 7.33 mg/dL — ABNORMAL HIGH (ref 0.44–1.00)
GFR, Estimated: 6 mL/min — ABNORMAL LOW (ref >60)
Glucose, Bld: 214 mg/dL — ABNORMAL HIGH (ref 70–99)
Phosphorus: 9.2 mg/dL — ABNORMAL HIGH (ref 2.5–4.6)
Potassium: 4.2 mmol/L (ref 3.5–5.1)
Sodium: 127 mmol/L — ABNORMAL LOW (ref 135–145)

## 2020-11-17 LAB — GLUCOSE, CAPILLARY
Glucose-Capillary: 188 mg/dL — ABNORMAL HIGH (ref 70–99)
Glucose-Capillary: 189 mg/dL — ABNORMAL HIGH (ref 70–99)
Glucose-Capillary: 190 mg/dL — ABNORMAL HIGH (ref 70–99)
Glucose-Capillary: 199 mg/dL — ABNORMAL HIGH (ref 70–99)
Glucose-Capillary: 203 mg/dL — ABNORMAL HIGH (ref 70–99)
Glucose-Capillary: 224 mg/dL — ABNORMAL HIGH (ref 70–99)

## 2020-11-17 LAB — CBC WITH DIFFERENTIAL/PLATELET
Abs Immature Granulocytes: 0.19 10*3/uL — ABNORMAL HIGH (ref 0.00–0.07)
Abs Immature Granulocytes: 0.23 10*3/uL — ABNORMAL HIGH (ref 0.00–0.07)
Basophils Absolute: 0.1 10*3/uL (ref 0.0–0.1)
Basophils Absolute: 0.1 10*3/uL (ref 0.0–0.1)
Basophils Relative: 0 %
Basophils Relative: 0 %
Eosinophils Absolute: 0.1 10*3/uL (ref 0.0–0.5)
Eosinophils Absolute: 0.5 10*3/uL (ref 0.0–0.5)
Eosinophils Relative: 1 %
Eosinophils Relative: 2 %
HCT: 21.3 % — ABNORMAL LOW (ref 36.0–46.0)
HCT: 23.1 % — ABNORMAL LOW (ref 36.0–46.0)
Hemoglobin: 7 g/dL — ABNORMAL LOW (ref 12.0–15.0)
Hemoglobin: 7.6 g/dL — ABNORMAL LOW (ref 12.0–15.0)
Immature Granulocytes: 1 %
Immature Granulocytes: 1 %
Lymphocytes Relative: 2 %
Lymphocytes Relative: 2 %
Lymphs Abs: 0.4 10*3/uL — ABNORMAL LOW (ref 0.7–4.0)
Lymphs Abs: 0.5 10*3/uL — ABNORMAL LOW (ref 0.7–4.0)
MCH: 29.3 pg (ref 26.0–34.0)
MCH: 29.3 pg (ref 26.0–34.0)
MCHC: 32.9 g/dL (ref 30.0–36.0)
MCHC: 32.9 g/dL (ref 30.0–36.0)
MCV: 89.1 fL (ref 80.0–100.0)
MCV: 89.2 fL (ref 80.0–100.0)
Monocytes Absolute: 1.5 10*3/uL — ABNORMAL HIGH (ref 0.1–1.0)
Monocytes Absolute: 1.8 10*3/uL — ABNORMAL HIGH (ref 0.1–1.0)
Monocytes Relative: 6 %
Monocytes Relative: 8 %
Neutro Abs: 20.7 10*3/uL — ABNORMAL HIGH (ref 1.7–7.7)
Neutro Abs: 21.4 10*3/uL — ABNORMAL HIGH (ref 1.7–7.7)
Neutrophils Relative %: 87 %
Neutrophils Relative %: 90 %
Platelets: 150 10*3/uL (ref 150–400)
Platelets: 152 10*3/uL (ref 150–400)
RBC: 2.39 MIL/uL — ABNORMAL LOW (ref 3.87–5.11)
RBC: 2.59 MIL/uL — ABNORMAL LOW (ref 3.87–5.11)
RDW: 16.4 % — ABNORMAL HIGH (ref 11.5–15.5)
RDW: 16.7 % — ABNORMAL HIGH (ref 11.5–15.5)
WBC: 23.6 10*3/uL — ABNORMAL HIGH (ref 4.0–10.5)
WBC: 23.8 10*3/uL — ABNORMAL HIGH (ref 4.0–10.5)
nRBC: 0.1 % (ref 0.0–0.2)
nRBC: 0.2 % (ref 0.0–0.2)

## 2020-11-17 LAB — CBC
HCT: 25.7 % — ABNORMAL LOW (ref 36.0–46.0)
Hemoglobin: 8.3 g/dL — ABNORMAL LOW (ref 12.0–15.0)
MCH: 29.1 pg (ref 26.0–34.0)
MCHC: 32.3 g/dL (ref 30.0–36.0)
MCV: 90.2 fL (ref 80.0–100.0)
Platelets: 133 10*3/uL — ABNORMAL LOW (ref 150–400)
RBC: 2.85 MIL/uL — ABNORMAL LOW (ref 3.87–5.11)
RDW: 16.5 % — ABNORMAL HIGH (ref 11.5–15.5)
WBC: 24 10*3/uL — ABNORMAL HIGH (ref 4.0–10.5)
nRBC: 0.1 % (ref 0.0–0.2)

## 2020-11-17 LAB — PHOSPHORUS
Phosphorus: 10.2 mg/dL — ABNORMAL HIGH (ref 2.5–4.6)
Phosphorus: 9.8 mg/dL — ABNORMAL HIGH (ref 2.5–4.6)

## 2020-11-17 LAB — MAGNESIUM
Magnesium: 2 mg/dL (ref 1.7–2.4)
Magnesium: 2.1 mg/dL (ref 1.7–2.4)
Magnesium: 2.2 mg/dL (ref 1.7–2.4)

## 2020-11-17 LAB — TRIGLYCERIDES: Triglycerides: 128 mg/dL (ref <150)

## 2020-11-17 LAB — PREPARE RBC (CROSSMATCH)

## 2020-11-17 MED ORDER — VITAMIN D (ERGOCALCIFEROL) 1.25 MG (50000 UNIT) PO CAPS
50000.0000 [IU] | ORAL_CAPSULE | ORAL | Status: DC
  Administered 2020-11-17: 50000 [IU]
  Filled 2020-11-17: qty 1

## 2020-11-17 MED ORDER — MIDODRINE HCL 5 MG PO TABS
10.0000 mg | ORAL_TABLET | Freq: Three times a day (TID) | ORAL | Status: DC
  Administered 2020-11-17 – 2020-11-19 (×6): 10 mg
  Filled 2020-11-17 (×6): qty 2

## 2020-11-17 MED ORDER — PRISMASOL BGK 4/2.5 32-4-2.5 MEQ/L REPLACEMENT SOLN
Status: DC
  Filled 2020-11-17 (×5): qty 5000

## 2020-11-17 MED ORDER — PIPERACILLIN-TAZOBACTAM 3.375 G IVPB 30 MIN
3.3750 g | Freq: Four times a day (QID) | INTRAVENOUS | Status: AC
  Administered 2020-11-17 – 2020-11-19 (×7): 3.375 g via INTRAVENOUS
  Filled 2020-11-17 (×14): qty 50

## 2020-11-17 MED ORDER — HEPARIN SODIUM (PORCINE) 1000 UNIT/ML DIALYSIS
1000.0000 [IU] | INTRAMUSCULAR | Status: DC | PRN
  Filled 2020-11-17: qty 1
  Filled 2020-11-17: qty 6
  Filled 2020-11-17: qty 1

## 2020-11-17 MED ORDER — SODIUM CHLORIDE 0.9% IV SOLUTION: Freq: Once | INTRAVENOUS | Status: AC

## 2020-11-17 MED ORDER — PRISMASOL BGK 0/2.5 32-2.5 MEQ/L EC SOLN
Status: DC
  Filled 2020-11-17 (×12): qty 5000

## 2020-11-17 MED ORDER — PRISMASOL BGK 4/2.5 32-4-2.5 MEQ/L EC SOLN
Status: DC
  Filled 2020-11-17 (×24): qty 5000

## 2020-11-17 MED FILL — Medication: Qty: 1 | Status: AC

## 2020-11-17 NOTE — Plan of Care (Signed)
  Problem: Clinical Measurements: Goal: Ability to maintain clinical measurements within normal limits will improve Outcome: Progressing Goal: Will remain free from infection Outcome: Progressing Goal: Diagnostic test results will improve Outcome: Progressing Goal: Respiratory complications will improve Outcome: Progressing Goal: Cardiovascular complication will be avoided Outcome: Progressing   Problem: Activity: Goal: Risk for activity intolerance will decrease Outcome: Progressing   Problem: Nutrition: Goal: Adequate nutrition will be maintained Outcome: Progressing   Problem: Coping: Goal: Level of anxiety will decrease Outcome: Progressing   Problem: Elimination: Goal: Will not experience complications related to bowel motility Outcome: Progressing Goal: Will not experience complications related to urinary retention Outcome: Progressing   Problem: Pain Managment: Goal: General experience of comfort will improve Outcome: Progressing   Problem: Safety: Goal: Ability to remain free from injury will improve Outcome: Progressing   Problem: Skin Integrity: Goal: Risk for impaired skin integrity will decrease Outcome: Progressing   Problem: Education: Goal: Knowledge of General Education information will improve Description: Including pain rating scale, medication(s)/side effects and non-pharmacologic comfort measures Outcome: Progressing   Problem: Clinical Measurements: Goal: Ability to maintain clinical measurements within normal limits will improve Outcome: Progressing Goal: Will remain free from infection Outcome: Progressing Goal: Diagnostic test results will improve Outcome: Progressing Goal: Respiratory complications will improve Outcome: Progressing Goal: Cardiovascular complication will be avoided Outcome: Progressing   Problem: Activity: Goal: Risk for activity intolerance will decrease Outcome: Progressing   Problem: Nutrition: Goal: Adequate  nutrition will be maintained Outcome: Progressing   Problem: Coping: Goal: Level of anxiety will decrease Outcome: Progressing   Problem: Elimination: Goal: Will not experience complications related to bowel motility Outcome: Progressing Goal: Will not experience complications related to urinary retention Outcome: Progressing   Problem: Pain Managment: Goal: General experience of comfort will improve Outcome: Progressing   Problem: Safety: Goal: Ability to remain free from injury will improve Outcome: Progressing   Problem: Skin Integrity: Goal: Risk for impaired skin integrity will decrease Outcome: Progressing   Problem: Education: Goal: Ability to verbalize understanding of medication therapies will improve Outcome: Progressing   Problem: Cardiac: Goal: Ability to achieve and maintain adequate cardiopulmonary perfusion will improve Outcome: Progressing

## 2020-11-17 NOTE — Progress Notes (Signed)
Pharmacy Antibiotic Note  [name] is a 65 y.o. female admitted on 11/13/2020 with pneumonia.  Pharmacy has been consulted for Zosyn dosing.  Patient is ESRD - changing from iHD to CRRT today.  WBC is elevated at 23.8 (trending down).   Plan: Adjust Zosyn 3.375 g IV every 6 hours while on CRRT.  Monitor CRRT toleration and need to adjust therapy.  Monitor progress, cxs Kept end date November 22, 2020.   Height: '5\' 5"'$  (165.1 cm) Weight: 70.6 kg (155 lb 10.3 oz) IBW/kg (Calculated) : 57  Temp (24hrs), Avg:98.4 F (36.9 C), Min:98.1 F (36.7 C), Max:98.7 F (37.1 C)  Recent Labs  Lab 11/14/20 0401 11/14/20 0510 11/14/20 0745 11/14/20 0746 11/14/20 1413 11/14/20 2146 11/18/2020 2032 11/16/20 2340 11/17/20 0804  WBC  --   --  32.1*  --   --  29.4* 31.1* 23.6* 23.8*  CREATININE TEST WILL BE CREDITED  --   --  5.81*  --  6.83* 7.98*  --  8.93*  LATICACIDVEN  --  2.3*  --   --  1.7  --   --   --   --     Estimated Creatinine Clearance: 6.1 mL/min (A) (by C-G formula based on SCr of 8.93 mg/dL (H)).    Allergies  Allergen Reactions   Other Other (See Comments)    Tape Bruises and tears skin, Paper tape tolerated   Tape Other (See Comments)    Bruises and tears skin Paper tape tolerated Bruises and tears skin Paper tape tolerated Bruises and tears skin Paper tape tolerated   Renvela [Sevelamer] Nausea And Vomiting    Inability to eat or sleep per spouse. States that all side effects listed for this medication, the patient did experience    Antimicrobials this admission:  Zosyn 9/10 >> (9/16)    Dose adjustments this admission:   Microbiology results:  BCx: pending  UCx: pending   Sputum: pending   MRSA PCR: pending  Thank you for allowing pharmacy to be a part of this patient's care.  Sloan Leiter, PharmD, BCPS, BCCCP Clinical Pharmacist Please refer to [facility] for [place] numbers 11/17/2020 12:54 PM

## 2020-11-17 NOTE — Progress Notes (Signed)
  Chart review  Request for declot/fistulogram.  Patient currently intubated/sedated, temp HD cath in place.  Hold off on elective fistula declot until patient medically stable.  Laderrick Wilk S Cane Dubray PA-C 11/17/2020 9:03 AM

## 2020-11-17 NOTE — Progress Notes (Signed)
eLink Physician-Brief Progress Note Patient Name: [name] DOB: 1955-12-30 MRN: [id number]   Date of Service  11/17/2020  HPI/Events of Note  Bedside monitor was alarming for ST segment abnormality so bedside RN obtained a 12 lead EKG which shows atrial fibrillation with a controlled ventricular response rate, flipped T in aVL of unclear significance, and a QTc of 523, Patient is on Amiodarone.  eICU Interventions  Will cycle Troponin, screen MAR for other QT prolonging drugs and discontinue them, hold Amiodarone and repeat EKG in AM.        Rollan Roger U Adreana Coull 11/17/2020, 10:43 PM

## 2020-11-17 NOTE — Progress Notes (Signed)
Orthopaedic Trauma Progress Note  SUBJECTIVE: Patient remains intubated and sedated. Does open eyes to voice. Not able to follow all commands. Patient noted to again have significant drainage from dressing overnight. New pressure dressing applied and this morning the current dressing appears clean and dry. No family at bedside currently.   OBJECTIVE:  Vitals:   11/17/20 0700 11/17/20 0753  BP: (!) 130/54 (!) 141/52  Pulse: (!) 109 (!) 112  Resp:  12  Temp:    SpO2: 100% 100%    General: Intubated. Opens eyes.  Respiratory: No increased work of breathing.  LLE: Dressing removed, incision currently CDI. Picture as below. Significant bruising about the knee. Eschar over knee is stable from pre-op. Foot slightly cool but equal to contralateral side. Open eyes to voice but unable to obtain reliable motor or sensory exam.     IMAGING: Stable post op imaging.   LABS:  Results for orders placed or performed during the hospital encounter of 11/26/2020 (from the past 24 hour(s))  Prepare RBC (crossmatch)     Status: None   Collection Time: 11/16/20  8:48 AM  Result Value Ref Range   Order Confirmation      ORDER PROCESSED BY BLOOD BANK Performed at [facility], [address] [place], [place] [address]   Glucose, capillary     Status: Abnormal   Collection Time: 11/16/20 11:35 AM  Result Value Ref Range   Glucose-Capillary 217 (H) 70 - 99 mg/dL  Glucose, capillary     Status: Abnormal   Collection Time: 11/16/20  5:17 PM  Result Value Ref Range   Glucose-Capillary 244 (H) 70 - 99 mg/dL  Glucose, capillary     Status: Abnormal   Collection Time: 11/16/20  7:39 PM  Result Value Ref Range   Glucose-Capillary 243 (H) 70 - 99 mg/dL  Magnesium     Status: None   Collection Time: 11/16/20 11:40 PM  Result Value Ref Range   Magnesium 2.2 1.7 - 2.4 mg/dL  Phosphorus     Status: Abnormal   Collection Time: 11/16/20 11:40 PM  Result Value Ref Range   Phosphorus 10.2 (H) 2.5 - 4.6  mg/dL  CBC with Differential/Platelet     Status: Abnormal   Collection Time: 11/16/20 11:40 PM  Result Value Ref Range   WBC 23.6 (H) 4.0 - 10.5 K/uL   RBC 2.59 (L) 3.87 - 5.11 MIL/uL   Hemoglobin 7.6 (L) 12.0 - 15.0 g/dL   HCT 23.1 (L) 36.0 - 46.0 %   MCV 89.2 80.0 - 100.0 fL   MCH 29.3 26.0 - 34.0 pg   MCHC 32.9 30.0 - 36.0 g/dL   RDW 16.4 (H) 11.5 - 15.5 %   Platelets 152 150 - 400 K/uL   nRBC 0.1 0.0 - 0.2 %   Neutrophils Relative % 90 %   Neutro Abs 21.4 (H) 1.7 - 7.7 K/uL   Lymphocytes Relative 2 %   Lymphs Abs 0.4 (L) 0.7 - 4.0 K/uL   Monocytes Relative 6 %   Monocytes Absolute 1.5 (H) 0.1 - 1.0 K/uL   Eosinophils Relative 1 %   Eosinophils Absolute 0.1 0.0 - 0.5 K/uL   Basophils Relative 0 %   Basophils Absolute 0.1 0.0 - 0.1 K/uL   Immature Granulocytes 1 %   Abs Immature Granulocytes 0.19 (H) 0.00 - 0.07 K/uL  Glucose, capillary     Status: Abnormal   Collection Time: 11/16/20 11:41 PM  Result Value Ref Range  Glucose-Capillary 190 (H) 70 - 99 mg/dL  Type and screen [place]     Status: None (Preliminary result)   Collection Time: 11/17/20  2:45 AM  Result Value Ref Range   ABO/RH(D) A NEG    Antibody Screen NEG    Sample Expiration      11/14/2020,2359 Performed at [facility], [place] [address] [place], [place] [address]    Unit Number [id number]    Blood Component Type RED CELLS,LR    Unit division 00    Status of Unit ALLOCATED    Transfusion Status OK TO TRANSFUSE    Crossmatch Result Compatible   Triglycerides     Status: None   Collection Time: 11/17/20  2:45 AM  Result Value Ref Range   Triglycerides 128 <150 mg/dL  Magnesium     Status: None   Collection Time: 11/17/20  2:45 AM  Result Value Ref Range   Magnesium 2.1 1.7 - 2.4 mg/dL  Phosphorus     Status: Abnormal   Collection Time: 11/17/20  2:45 AM  Result Value Ref Range   Phosphorus 9.8 (H) 2.5 - 4.6 mg/dL  Glucose, capillary     Status: Abnormal    Collection Time: 11/17/20  3:30 AM  Result Value Ref Range   Glucose-Capillary 189 (H) 70 - 99 mg/dL  Glucose, capillary     Status: Abnormal   Collection Time: 11/17/20  8:26 AM  Result Value Ref Range   Glucose-Capillary 190 (H) 70 - 99 mg/dL   Comment 1 Notify RN   Prepare RBC (crossmatch)     Status: None   Collection Time: 11/17/20  8:30 AM  Result Value Ref Range   Order Confirmation      ORDER PROCESSED BY BLOOD BANK Performed at [facility], [place] [address], [place], [place] [address]     ASSESSMENT: [name] is a 65 y.o. female, 2 Days Post-Op s/p ORIF LEFT DISTAL FEMUR FRACTURE  CV/Blood loss: Acute blood loss anemia, last drawn Hgb 7.6 on 9/13 at 2300. CBC pending this AM. Received 1 unit PRBCs yesterday  PLAN: Weightbearing: TDWB LLE Incisional and dressing care: Reinforce dressings as needed.  Showering: Bed bath Orthopedic device(s):  None Pain management: per primary team VTE prophylaxis: Heparin, SCDs ID: On Zosyn per primary team.   Foley/Lines:  No foley, KVO IVFs Impediments to Fracture Healing: Vit D level 9, start on D2 supplementation  Dispo: Intubated currently. PT/OT once able. LLE incision currently CDI. Reinforce dressing as needed. Please call Dorthey Depace (ortho PA) if drainage persists and we will apply incisional wound vac. All supplies for this already at bedside Follow - up plan:  TBD  Contact information:  Katha Hamming MD, Patrecia Pace PA-C. After hours and holidays please check [email or web address] for group call information for [facility]   Adrine Hayworth A. Ricci Barker, PA-C [phone] (office) [email or web address]

## 2020-11-17 NOTE — Progress Notes (Signed)
Notified Elink of patients current EKG reading (see chart). Will continue monitoring patient. VSS. On CRRT.

## 2020-11-17 NOTE — Progress Notes (Signed)
NAME:  [name], MRN:  [id number], DOB:  Jan 13, 1956, LOS: 5 ADMISSION DATE:  11/28/2020, CONSULTATION DATE: 11/13/2020 REFERRING MD: Heath Lark DO, CHIEF COMPLAINT:  L femur fracture  History of Present Illness:  65 year old with hypertension, hyperlipidemia, diabetes, afib, systolic and diastolic CHF, end-stage renal disease s/p failed transplant presenting with left distal femur fracture after a fall.  Ortho is planning for OR intervention on 9/12 at [facility]  Attempted hemodialysis on 9/9 but aborted due to clotting of AV fistula, unable to return 300 cc of blood. She then had a femoral catheter placed and dialyzed today 9/10 with 1 unit of blood due to blood loss anemia.  During dialysis she developed acute respiratory failure, possible aspiration and emergently intubated.    PCCM consulted for transfer to [facility] for further management  Pertinent  Medical History   Past Medical History:  Diagnosis Date   Abnormal CXR (chest x-ray) 05/25/2019   Abnormal LFTs (liver function tests) 03/11/2018   Acute hypoxemic respiratory failure ([place]) 12/16/2018   Acute on chronic combined systolic and diastolic CHF (congestive heart failure) -EF 40 to 45 % 08/10/2019   Acute respiratory failure with hypoxia ([place]) 12/02/2018   Anemia    of chronic disease   Blood transfusion without reported diagnosis    Cardiovascular disease    nonobstructive   Carotid artery stenosis 2008   CHF (congestive heart failure) ([place])    Coronary artery disease    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   Last Assessment & Plan:  - Continuing home suppression therapy: Bactrim M-W-F, Prednisone 5 mg daily, Myfortic 360 mg BID and Prograf 1 mg BID - Appreciate Nephrology recs   Diabetes mellitus    Dyslipidemia    Edema of lower extremity    with hypo-albuminemia and profound protenuria   HCAP (healthcare-associated pneumonia)    Heart murmur    Hypertension    Lung collapse 12/15/2018   Mitral  regurgitation    Nephrotic syndrome    Patent foramen ovale    Pulmonary edema 08/10/2019   Pulmonary hypertension, moderate to severe [facility])    Pulmonary nodule    Renal insufficiency    Tricuspid regurgitation    Volume depletion, renal, due to output loss (renal deficit)   Afib rvr 07/2020 on amiodarone  Significant Hospital Events: Including procedures, antibiotic start and stop dates in addition to other pertinent events   9/9 Admitted after a fall with femur fracture, HD catheter placed due to a clotted AV fistula 9/10 intubated for acute respiratory failure, aspiration 9/11: Transferred to [facility] MICU 9/12: ORIF 9/14: Plan for IR AV fistula interrogation pending improvement of hemoglobin  Interim History / Subjective:  Patient remains intubated and on fentanyl drip. Still requiring 9 of Levophed to maintain pressures. Opens eyes and nods able to follow some commands but unable to wiggle toes despite nodding that she's trying. Sensation intact. On pressure support trial this morning  Objective   Blood pressure (!) 130/54, pulse (!) 109, temperature 98.4 F (36.9 C), temperature source Axillary, resp. rate 14, height '5\' 5"'$  (1.651 m), weight 70.6 kg, SpO2 100 %.    Vent Mode: PRVC FiO2 (%):  [40 %] 40 % Set Rate:  [14 bmp] 14 bmp Vt Set:  [450 mL] 450 mL PEEP:  [5 cmH20] 5 cmH20 Plateau Pressure:  [11 cmH20-15 cmH20] 15 cmH20   Intake/Output Summary (Last 24 hours) at 11/17/2020 0753 Last data filed at 11/17/2020 0600 Gross per 24 hour  Intake  1206.79 ml  Output 100 ml  Net 1106.79 ml   Filed Weights   11/14/20 0230 11/14/2020 0422 11/16/20 0443  Weight: 69.8 kg 59.7 kg 70.6 kg    Examination:  General:  critically ill appearing on mech vent, Eyes open and tracting, agitated by ET tube HEENT: MM pink/moist; ETT in place Neuro: only on fentynl drip, eyes open and tracking, nods to questions, indicates that she's trying to wiggle toes but not doing so. Motor and sensory exam  unreliable. Able to follow some commands CV: tachycardic, afib on monitor, no m/r/g PULM:  lung apices clear BS bilaterally; on mech vent PS at 40% FIO2 GI: soft, non tender, non distended Extremities: slightly cool bilateral feet, no edema; bandage on left leg  Resolved Hospital Problem list     Assessment & Plan:  Fall, left femur fracture -per ortho -dressing in place  Acute respiratory failure w/ hypoxia Possible aspiration pneumonia WBC count decreasing. Culture with rare staph aures. Remains afebrile. -continue mechanical ventilation with plan to work toward extubation after IR intervention/CRRT -VAP prevention in place -wean fio2 for sats >90% -daily sbt/sat. Will work toward extubation after CRRT -f/u trach aspirate culture sensitivities -continue zosyn for possible aspiration for 5 days (9/12 - 9/16) -trend CBC/fever curve  Acute Blood Loss Anemia Shock: likely hypovolemic/ vasoplegic post surgery. S/p 3 units PRBC and 1L fluids. Unable to tolerate lower doses of levophed at this time.  - given persistent oozing from surgical site and hypotension, giving another L of PRBC today - wean levophed to map > 65 - Adding midodrine  - transfuse for hgb <7 -infectious workup as above - monitor H and H  Acute encephalopathy secondary to intradialytic hypotension -limited improvement in mental status likely due to build up of metabolic causes. Will work toward extubation in mental status improves after CRRT.  ESRD: on HD MWF S/p failed renal transplant Hyperkalemia- Resolved with diaylsis Hyperphosphoremia -nephro following -Failed dialysis 9/9 due to clotted AV fistula -Plan for IR intervention when hemoglobin stable -Monitor BMP - Per nephro: resume calcitriol and renvela when able to tolerate PO - holding prograf for now, awaiting nephrology consultation - continue home prednisone - Per nephrology, plan for HD or CRRT today   Chronic systolic and diastolic CHF: LVEF  [id number] - caution with additional IV fluids - hold home metoprolol  Hx HTN and HLD -continue statin -holding home meds in setting of hypotension  Hx of afib Patient remains in Afib and tachycardic despite home dose of amiodarone.  -continue amiodarone -holding eliquis until hemoglobin stable -holding metoprolol in setting of hypotension  T2DM w/ nephropathy and gastroparesis -SSI and CBG monitoring -continue reglan  Hx of generalized anxiety disorder P: -continue abilify and zoloft -will hold mirtazapine until extubated  Best Practice (right click and "Reselect all SmartList Selections" daily)   Diet/type: NPO w/ meds via tube DVT prophylaxis: SCD; holding home eliquis until hemoglobin stable GI prophylaxis: PPI Lines: Dialysis Catheter Foley:  N/A Code Status:  full code Last date of multidisciplinary goals of care discussion [pending]  Labs   CBC: Recent Labs  Lab 11/21/2020 1015 11/13/20 0548 11/14/20 0400 11/14/20 0745 11/14/20 1414 11/14/20 2146 12/01/2020 2032 11/16/20 0137 11/16/20 2340  WBC 19.2*   < > TEST WILL BE CREDITED 32.1*  --  29.4* 31.1*  --  23.6*  NEUTROABS 16.1*  --   --   --   --   --   --   --  21.4*  HGB  7.4*   < > TEST WILL BE CREDITED 7.5* 8.8* 9.1* 7.5* 7.3* 7.6*  HCT 25.0*   < > TEST WILL BE CREDITED 24.5* 29.0* 28.6* 24.1* 23.0* 23.1*  MCV 96.5   < > TEST WILL BE CREDITED 94.6  --  90.8 92.0  --  89.2  PLT 227   < > TEST WILL BE CREDITED 199  --  194 198  --  152   < > = values in this interval not displayed.    Basic Metabolic Panel: Recent Labs  Lab 11/13/20 0548 11/13/20 0548 11/14/20 0401 11/14/20 0745 11/14/20 0746 11/14/20 2146 11/22/2020 1700 11/11/2020 2032 11/16/20 0137 11/16/20 2340 11/17/20 0245  NA 140  --  TEST WILL BE CREDITED  --  132* 129*  --  128*  --   --   --   K 5.9*  --  TEST WILL BE CREDITED  --  3.8 4.0  --  4.1  --   --   --   CL 102  --  TEST WILL BE CREDITED  --  96* 94*  --  93*  --   --   --   CO2  18*  --  TEST WILL BE CREDITED  --  17* 17*  --  18*  --   --   --   GLUCOSE 153*  --  TEST WILL BE CREDITED  --  184* 176*  --  265*  --   --   --   BUN 92*  --  TEST WILL BE CREDITED  --  27* 34*  --  46*  --   --   --   CREATININE 13.54*  --  TEST WILL BE CREDITED  --  5.81* 6.83*  --  7.98*  --   --   --   CALCIUM 7.7*  --  TEST WILL BE CREDITED  --  7.0* 6.9*  --  6.9*  --   --   --   MG 2.1  --  TEST WILL BE CREDITED   < >  --  1.8  --  2.0 2.0 2.2 2.1  PHOS  --    < > TEST WILL BE CREDITED  --  6.4*  --  9.5*  --  9.7* 10.2* 9.8*   < > = values in this interval not displayed.   GFR: Estimated Creatinine Clearance: 6.8 mL/min (A) (by C-G formula based on SCr of 7.98 mg/dL (H)). Recent Labs  Lab 11/13/20 1330 11/14/20 0400 11/14/20 0510 11/14/20 0745 11/14/20 1413 11/14/20 2146 11/18/2020 2032 11/16/20 2340  PROCALCITON 3.92  --   --   --   --   --   --   --   WBC  --    < >  --  32.1*  --  29.4* 31.1* 23.6*  LATICACIDVEN  --   --  2.3*  --  1.7  --   --   --    < > = values in this interval not displayed.    Liver Function Tests: Recent Labs  Lab 11/07/2020 1015 11/14/20 0401 11/14/20 0746  AST 27  --   --   ALT 34  --   --   ALKPHOS 101  --   --   BILITOT 0.5  --   --   PROT 6.6  --   --   ALBUMIN 2.7* TEST WILL BE CREDITED 2.3*   No results for input(s): LIPASE, AMYLASE in the last  168 hours. No results for input(s): AMMONIA in the last 168 hours.  ABG    Component Value Date/Time   PHART 7.421 11/13/2020 2309   PCO2ART 28.4 (L) 11/13/2020 2309   PO2ART 147 (H) 11/13/2020 2309   HCO3 20.4 11/13/2020 2309   TCO2 28 07/20/2019 1354   ACIDBASEDEF 5.5 (H) 11/13/2020 2309   O2SAT 98.7 11/13/2020 2309     Coagulation Profile: Recent Labs  Lab 11/29/2020 2243  INR 1.3*    Cardiac Enzymes: No results for input(s): CKTOTAL, CKMB, CKMBINDEX, TROPONINI in the last 168 hours.  HbA1C: HB A1C (BAYER DCA - WAIVED)  Date/Time Value Ref Range Status  10/19/2020  09:41 AM 6.7 <7.0 % Final    Comment:                                          Diabetic Adult            <7.0                                       Healthy Adult        4.3 - 5.7                                                           (DCCT/NGSP) American Diabetes Association's Summary of Glycemic Recommendations for Adults with Diabetes: Hemoglobin A1c <7.0%. More stringent glycemic goals (A1c <6.0%) may further reduce complications at the cost of increased risk of hypoglycemia.   10/01/2018 11:48 AM 9.7 (H) <7.0 % Final    Comment:                                          Diabetic Adult            <7.0                                       Healthy Adult        4.3 - 5.7                                                           (DCCT/NGSP) American Diabetes Association's Summary of Glycemic Recommendations for Adults with Diabetes: Hemoglobin A1c <7.0%. More stringent glycemic goals (A1c <6.0%) may further reduce complications at the cost of increased risk of hypoglycemia.    Hgb A1c MFr Bld  Date/Time Value Ref Range Status  11/27/2020 10:15 AM 6.7 (H) 4.8 - 5.6 % Final    Comment:    (NOTE) Pre diabetes:          5.7%-6.4%  Diabetes:              >6.4%  Glycemic control for   <7.0% adults with  diabetes   01/06/2020 04:34 PM 8.7 (H) 4.8 - 5.6 % Final    Comment:    (NOTE) Pre diabetes:          5.7%-6.4%  Diabetes:              >6.4%  Glycemic control for   <7.0% adults with diabetes     CBG: Recent Labs  Lab 11/16/20 1135 11/16/20 1717 11/16/20 1939 11/16/20 2341 11/17/20 0330  GLUCAP 217* 244* 243* 190* 189*      Critical care time:     Reece Agar, MS4

## 2020-11-17 NOTE — Progress Notes (Signed)
[facility] NEPHROLOGY PROGRESS NOTE  Assessment/ Plan: Pt is a 65 y.o. yo female  with history of hypertension, HLD, type II DM, CHF, ESRD on HD MWF, h/o failed kidney transplant who presented to the [facility] ER with a left knee pain after a fall, seen as a consult for the management of ESRD.  She developed respiratory distress requiring intubation and transferred to [facility] ICU.  [facility]-  orders are as follows  MWF-  4 hours AVF-  16 gauge- 300 BFR 2/2.5 bath/300 rev/EDW 68.5-  no heparin Calcitriol 0.75 TIW, epo 12,000 TIW and venofer 50 weekly   #Comminuted distal left femur fracture after the fall: ortho on board, s/p left ORIF 9/12. With oozing still, platelet dysfunction 2/2 esrd? Will see if renal replacement therapy helps   # ESRD: MWF at [facility]:  -increasing pressor requirement, will start CRRT today via rt fem temp line. Discussed with staff.  #Vascular access: clotted AVF, IR has already been consulted for possible fgram and TDC placement once medically stable. Has a right fem temp line in the meantime   #Hyperkalemia: Improved after HD.  #Hyponatremia -will manage with renal replacement therapy   #Hypotension/shock, intradialytic hypotension.  Required midodrine, albumin during HD.  Currently she is on Levophed IV   # Anemia of ESRD: Probably some blood loss.  Receiving blood transfusion.  No iron because of infection and on antibiotics.  Continue ESA.  # Metabolic Bone Disease: Monitor calcium phosphorus level.  Resume calcitriol, Renvela when able to take orally.  Subjective: Seen and examined ICU. Increasing levo requirement. Still oozing from surg site, requiring ddavp. IR intervention on hold until patient is more medically stable Objective Vital signs in last 24 hours: Vitals:   11/17/20 0742 11/17/20 0753 11/17/20 0800 11/17/20 0805  BP:  (!) 141/52    Pulse: (!) 125 (!) 112  97  Resp:  12    Temp:   98.5 F (36.9 C)   TempSrc:    Axillary   SpO2: 100% 100%  100%  Weight:      Height:       Weight change:   Intake/Output Summary (Last 24 hours) at 11/17/2020 0934 Last data filed at 11/17/2020 0600 Gross per 24 hour  Intake 1058.68 ml  Output 100 ml  Net 958.68 ml       Labs: Basic Metabolic Panel: Recent Labs  Lab 11/14/20 2146 11/22/2020 1700 11/14/2020 2032 11/16/20 0137 11/16/20 2340 11/17/20 0245 11/17/20 0804  NA 129*  --  128*  --   --   --  125*  K 4.0  --  4.1  --   --   --  4.3  CL 94*  --  93*  --   --   --  88*  CO2 17*  --  18*  --   --   --  18*  GLUCOSE 176*  --  265*  --   --   --  204*  BUN 34*  --  46*  --   --   --  58*  CREATININE 6.83*  --  7.98*  --   --   --  8.93*  CALCIUM 6.9*  --  6.9*  --   --   --  7.0*  PHOS  --    < >  --  9.7* 10.2* 9.8*  --    < > = values in this interval not displayed.   Liver Function Tests: Recent Labs  Lab 11/24/2020 1015 11/14/20 0401 11/14/20 0746 11/17/20 0804  AST 27  --   --  21  ALT 34  --   --  8  ALKPHOS 101  --   --  60  BILITOT 0.5  --   --  1.2  PROT 6.6  --   --  5.1*  ALBUMIN 2.7* TEST WILL BE CREDITED 2.3* 1.6*   No results for input(s): LIPASE, AMYLASE in the last 168 hours. No results for input(s): AMMONIA in the last 168 hours. CBC: Recent Labs  Lab 11/06/2020 1015 11/13/20 0548 11/14/20 0745 11/14/20 1414 11/14/20 2146  2032 11/16/20 0137 11/16/20 2340 11/17/20 0804  WBC 19.2*   < > 32.1*  --  29.4* 31.1*  --  23.6* 23.8*  NEUTROABS 16.1*  --   --   --   --   --   --  21.4* 20.7*  HGB 7.4*   < > 7.5*   < > 9.1* 7.5* 7.3* 7.6* 7.0*  HCT 25.0*   < > 24.5*   < > 28.6* 24.1* 23.0* 23.1* 21.3*  MCV 96.5   < > 94.6  --  90.8 92.0  --  89.2 89.1  PLT 227   < > 199  --  194 198  --  152 150   < > = values in this interval not displayed.   Cardiac Enzymes: No results for input(s): CKTOTAL, CKMB, CKMBINDEX, TROPONINI in the last 168 hours. CBG: Recent Labs  Lab 11/16/20 1717 11/16/20 1939 11/16/20 2341  11/17/20 0330 11/17/20 0826  GLUCAP 244* 243* 190* 189* 190*    Iron Studies:  No results for input(s): IRON, TIBC, TRANSFERRIN, FERRITIN in the last 72 hours.  Studies/Results: DG Knee Left Port  Result Date: 11/14/2020 CLINICAL DATA:  Status post left knee surgery. EXAM: PORTABLE LEFT KNEE - 1-2 VIEW COMPARISON:  Left femur x-ray 11/05/2020. FINDINGS: There is a new lateral sideplate with multiple screws screws fixating a distal femoral fracture. Alignment is anatomic. With recent surgery. Vascular calcifications are noted in the soft tissues. There is soft tissue swelling and air in the soft tissues compatible IMPRESSION: 1. ORIF distal femoral fracture in anatomic alignment. Electronically Signed   By: Ronney Asters M.D.   On: 12/03/2020 18:44   DG C-Arm 1-60 Min-No Report  Result Date: 11/23/2020 Fluoroscopy was utilized by the requesting physician.  No radiographic interpretation.   DG FEMUR MIN 2 VIEWS LEFT  Result Date: 11/13/2020 CLINICAL DATA:  Left femoral ORIF, intraoperative examination EXAM: LEFT FEMUR 2 VIEWS COMPARISON:  11/09/2020 FINDINGS: Six fluoroscopic intraoperative radiographs of the left distal femur demonstrates open reduction and internal fixation of a comminuted distal femoral fracture utilizing a lateral plate and multiple cortical screws fracture fragments appear in grossly anatomic alignment on final images. No dislocation. Advanced vascular calcifications noted. Fluoroscopy time: 50 seconds Dose: 1.48 mGy Images: 6 IMPRESSION: Left distal femoral ORIF as described above Electronically Signed   By: Fidela Salisbury M.D.   On: 11/23/2020 16:12    Medications: Infusions:   prismasol BGK 4/2.5     sodium chloride     feeding supplement (VITAL 1.5 CAL) Stopped (11/16/20 0000)   fentaNYL infusion INTRAVENOUS 50 mcg/hr (11/17/20 0931)   norepinephrine (LEVOPHED) Adult infusion 9 mcg/min (11/17/20 0600)   piperacillin-tazobactam (ZOSYN)  IV 100 mL/hr at 11/17/20  0600   prismasol BGK 2/2.5 replacement solution     prismasol BGK 4/2.5      Scheduled Medications:  sodium  chloride   Intravenous Once   sodium chloride   Intravenous Once   amiodarone  100 mg Per Tube Daily   ARIPiprazole  5 mg Per Tube QHS   aspirin  81 mg Per Tube Daily   calcitRIOL  0.75 mcg Per Tube Q T,Th,Sat-1800   calcium acetate  667 mg Oral TID WC   chlorhexidine gluconate (MEDLINE KIT)  15 mL Mouth Rinse BID   Chlorhexidine Gluconate Cloth  6 each Topical Q0600   darbepoetin (ARANESP) injection - DIALYSIS  40 mcg Intravenous Once   docusate  100 mg Per Tube BID   feeding supplement (PROSource TF)  45 mL Per Tube Daily   insulin aspart  0-6 Units Subcutaneous Q4H   insulin glargine-yfgn  10 Units Subcutaneous Daily   mouth rinse  15 mL Mouth Rinse 10 times per day   metoCLOPramide  5 mg Per Tube TID AC   midodrine  10 mg Per Tube Q8H   multivitamin  1 tablet Per Tube Daily   pantoprazole (PROTONIX) IV  40 mg Intravenous Q24H   polyethylene glycol  17 g Per Tube Daily   predniSONE  5 mg Per Tube Q breakfast   senna  1 tablet Per Tube BID   sertraline  100 mg Per Tube QHS   sevelamer carbonate  3,200 mg Per Tube TID with meals   sodium chloride flush  10-40 mL Intracatheter Q12H   sodium chloride flush  3 mL Intravenous Q12H   Thrombi-Pad  2 each Topical Once   Vitamin D (Ergocalciferol)  50,000 Units Per Tube Q7 days    have reviewed scheduled and prn medications.  Physical Exam: General: Ill-looking female intubated, sedated. Heart:RRR, s1s2 nl Lungs: cta bl Abdomen:soft,  non-distended Extremities:+ edema b/l le Neuro: sedated, does open eyes intermittently Dialysis Access: Right groin catheter in place, left upper extremity AV fistula has no bruit or thrill.  Kally Cadden 11/17/2020,9:34 AM  LOS: 5 days

## 2020-11-18 ENCOUNTER — Inpatient Hospital Stay (HOSPITAL_COMMUNITY): Payer: Medicare HMO

## 2020-11-18 DIAGNOSIS — R579 Shock, unspecified: Secondary | ICD-10-CM | POA: Diagnosis not present

## 2020-11-18 LAB — RENAL FUNCTION PANEL
Albumin: 1.7 g/dL — ABNORMAL LOW (ref 3.5–5.0)
Albumin: 1.7 g/dL — ABNORMAL LOW (ref 3.5–5.0)
Anion gap: 13 (ref 5–15)
Anion gap: 11 (ref 5–15)
BUN: 36 mg/dL — ABNORMAL HIGH (ref 8–23)
BUN: 14 mg/dL (ref 8–23)
CO2: 21 mmol/L — ABNORMAL LOW (ref 22–32)
CO2: 23 mmol/L (ref 22–32)
Calcium: 7.2 mg/dL — ABNORMAL LOW (ref 8.9–10.3)
Calcium: 7.4 mg/dL — ABNORMAL LOW (ref 8.9–10.3)
Chloride: 96 mmol/L — ABNORMAL LOW (ref 98–111)
Chloride: 99 mmol/L (ref 98–111)
Creatinine, Ser: 5.18 mg/dL — ABNORMAL HIGH (ref 0.44–1.00)
Creatinine, Ser: 2.06 mg/dL — ABNORMAL HIGH (ref 0.44–1.00)
GFR, Estimated: 9 mL/min — ABNORMAL LOW (ref >60)
GFR, Estimated: 26 mL/min — ABNORMAL LOW (ref >60)
Glucose, Bld: 220 mg/dL — ABNORMAL HIGH (ref 70–99)
Glucose, Bld: 150 mg/dL — ABNORMAL HIGH (ref 70–99)
Phosphorus: 6.3 mg/dL — ABNORMAL HIGH (ref 2.5–4.6)
Phosphorus: 2.9 mg/dL (ref 2.5–4.6)
Potassium: 4.6 mmol/L (ref 3.5–5.1)
Potassium: 4.1 mmol/L (ref 3.5–5.1)
Sodium: 130 mmol/L — ABNORMAL LOW (ref 135–145)
Sodium: 133 mmol/L — ABNORMAL LOW (ref 135–145)

## 2020-11-18 LAB — TYPE AND SCREEN
ABO/RH(D): A NEG
Antibody Screen: NEGATIVE
Unit division: 0

## 2020-11-18 LAB — CULTURE, RESPIRATORY W GRAM STAIN

## 2020-11-18 LAB — BPAM RBC
Blood Product Expiration Date: 202210082359
ISSUE DATE / TIME: 202209141057
Unit Type and Rh: 600

## 2020-11-18 LAB — MAGNESIUM: Magnesium: 2.3 mg/dL (ref 1.7–2.4)

## 2020-11-18 LAB — TROPONIN I (HIGH SENSITIVITY)
Troponin I (High Sensitivity): 81 ng/L — ABNORMAL HIGH (ref <18)
Troponin I (High Sensitivity): 77 ng/L — ABNORMAL HIGH (ref <18)

## 2020-11-18 LAB — CBC WITH DIFFERENTIAL/PLATELET
Abs Immature Granulocytes: 0.22 10*3/uL — ABNORMAL HIGH (ref 0.00–0.07)
Basophils Absolute: 0.1 10*3/uL (ref 0.0–0.1)
Basophils Relative: 0 %
Eosinophils Absolute: 0.5 10*3/uL (ref 0.0–0.5)
Eosinophils Relative: 2 %
HCT: 27.5 % — ABNORMAL LOW (ref 36.0–46.0)
Hemoglobin: 8.8 g/dL — ABNORMAL LOW (ref 12.0–15.0)
Immature Granulocytes: 1 %
Lymphocytes Relative: 2 %
Lymphs Abs: 0.4 10*3/uL — ABNORMAL LOW (ref 0.7–4.0)
MCH: 28.9 pg (ref 26.0–34.0)
MCHC: 32 g/dL (ref 30.0–36.0)
MCV: 90.5 fL (ref 80.0–100.0)
Monocytes Absolute: 1.6 10*3/uL — ABNORMAL HIGH (ref 0.1–1.0)
Monocytes Relative: 7 %
Neutro Abs: 21.7 10*3/uL — ABNORMAL HIGH (ref 1.7–7.7)
Neutrophils Relative %: 88 %
Platelets: 142 10*3/uL — ABNORMAL LOW (ref 150–400)
RBC: 3.04 MIL/uL — ABNORMAL LOW (ref 3.87–5.11)
RDW: 17.2 % — ABNORMAL HIGH (ref 11.5–15.5)
WBC: 24.5 10*3/uL — ABNORMAL HIGH (ref 4.0–10.5)
nRBC: 0.1 % (ref 0.0–0.2)

## 2020-11-18 LAB — POCT I-STAT 7, (LYTES, BLD GAS, ICA,H+H)
Acid-base deficit: 3 mmol/L — ABNORMAL HIGH (ref 0.0–2.0)
Bicarbonate: 23.3 mmol/L (ref 20.0–28.0)
Calcium, Ion: 1.1 mmol/L — ABNORMAL LOW (ref 1.15–1.40)
HCT: 26 % — ABNORMAL LOW (ref 36.0–46.0)
Hemoglobin: 8.8 g/dL — ABNORMAL LOW (ref 12.0–15.0)
O2 Saturation: 88 %
Patient temperature: 94.4
Potassium: 4.3 mmol/L (ref 3.5–5.1)
Sodium: 133 mmol/L — ABNORMAL LOW (ref 135–145)
TCO2: 25 mmol/L (ref 22–32)
pCO2 arterial: 44.3 mmHg (ref 32.0–48.0)
pH, Arterial: 7.316 — ABNORMAL LOW (ref 7.350–7.450)
pO2, Arterial: 52 mmHg — ABNORMAL LOW (ref 83.0–108.0)

## 2020-11-18 LAB — GLUCOSE, CAPILLARY
Glucose-Capillary: 129 mg/dL — ABNORMAL HIGH (ref 70–99)
Glucose-Capillary: 151 mg/dL — ABNORMAL HIGH (ref 70–99)
Glucose-Capillary: 155 mg/dL — ABNORMAL HIGH (ref 70–99)
Glucose-Capillary: 172 mg/dL — ABNORMAL HIGH (ref 70–99)
Glucose-Capillary: 173 mg/dL — ABNORMAL HIGH (ref 70–99)
Glucose-Capillary: 174 mg/dL — ABNORMAL HIGH (ref 70–99)

## 2020-11-18 LAB — HEPARIN LEVEL (UNFRACTIONATED): Heparin Unfractionated: 0.12 IU/mL — ABNORMAL LOW (ref 0.30–0.70)

## 2020-11-18 MED ORDER — METOCLOPRAMIDE HCL 5 MG/ML IJ SOLN
10.0000 mg | Freq: Four times a day (QID) | INTRAMUSCULAR | Status: DC
  Administered 2020-11-18: 10 mg via INTRAVENOUS
  Filled 2020-11-18: qty 2

## 2020-11-18 MED ORDER — CHLORHEXIDINE GLUCONATE 0.12 % MT SOLN
15.0000 mL | Freq: Two times a day (BID) | OROMUCOSAL | Status: DC
  Administered 2020-11-18: 15 mL via OROMUCOSAL

## 2020-11-18 MED ORDER — HEPARIN (PORCINE) 25000 UT/250ML-% IV SOLN
1200.0000 [IU]/h | INTRAVENOUS | Status: DC
  Administered 2020-11-18: 1000 [IU]/h via INTRAVENOUS
  Administered 2020-11-19: 1200 [IU]/h via INTRAVENOUS
  Filled 2020-11-18 (×2): qty 250

## 2020-11-18 MED ORDER — ORAL CARE MOUTH RINSE: 15.0000 mL | Freq: Two times a day (BID) | OROMUCOSAL | Status: DC

## 2020-11-18 MED ORDER — DARBEPOETIN ALFA 100 MCG/0.5ML IJ SOSY
100.0000 ug | PREFILLED_SYRINGE | INTRAMUSCULAR | Status: DC
  Filled 2020-11-18: qty 0.5

## 2020-11-18 MED ORDER — MAGNESIUM SULFATE 2 GM/50ML IV SOLN
2.0000 g | Freq: Once | INTRAVENOUS | Status: AC
  Administered 2020-11-18: 2 g via INTRAVENOUS
  Filled 2020-11-18: qty 50

## 2020-11-18 MED ORDER — ACETAMINOPHEN 10 MG/ML IV SOLN
1000.0000 mg | Freq: Four times a day (QID) | INTRAVENOUS | Status: AC
  Administered 2020-11-18 – 2020-11-19 (×4): 1000 mg via INTRAVENOUS
  Filled 2020-11-18 (×4): qty 100

## 2020-11-18 MED ORDER — DARBEPOETIN ALFA 100 MCG/0.5ML IJ SOSY
100.0000 ug | PREFILLED_SYRINGE | INTRAMUSCULAR | Status: DC
  Administered 2020-11-18: 100 ug via SUBCUTANEOUS
  Filled 2020-11-18: qty 0.5

## 2020-11-18 MED ORDER — METOCLOPRAMIDE HCL 5 MG/ML IJ SOLN
5.0000 mg | Freq: Three times a day (TID) | INTRAMUSCULAR | Status: DC
  Administered 2020-11-18 – 2020-11-19 (×4): 5 mg via INTRAVENOUS
  Filled 2020-11-18 (×4): qty 2

## 2020-11-18 MED FILL — Fentanyl Citrate Preservative Free (PF) Inj 100 MCG/2ML: INTRAMUSCULAR | Qty: 2 | Status: AC

## 2020-11-18 MED FILL — Ketamine HCl Inj 10 MG/ML: INTRAMUSCULAR | Qty: 13 | Status: AC

## 2020-11-18 NOTE — TOC Initial Note (Addendum)
Transition of Care [facility]) - Initial/Assessment Note    Patient Details  Name: [name] MRN: [id number] Date of Birth: [birth year]  Transition of Care [facility]) CM/SW Contact:    Tom-Johnson, Renea Ee, RN Phone Number: 11/18/2020, 2:10 PM  Clinical Narrative:                 CM spoke with patient at bedside and she gave permission to speak with husband about needs for post hospital transition. CM called and spoke with husband [name]     [phone]). Husband states that patient lives with him and their two grand children. They have two children and seven grand children and are supportive. Patient has been using a walker and started using a wheelchair for the past three months when they are going out. Has a cane, walker, wheelchair and shower chair at home. States she does not smoke or use smokeless tobacco, she does not drink alcohol and does not use drugs. Goes to dialysis at [facility] in [place], [place] [address]. Husband transports to and from dialysis and does errands. Husband requests if patient is discharged to a rehab facility, to consider her dialysis days and center. Patient extubated today. CM awaiting PT/OT eval for discharge disposition and needs.    Barriers to Discharge: Continued Medical Work up   Patient Goals and CMS Choice Patient states their goals for this hospitalization and ongoing recovery are:: To get rehab and go home to husband.      Expected Discharge Plan and Services   In-house Referral: Clinical Social Work Discharge Planning Services: CM Consult   Living arrangements for the past 2 months: [place]                                      Prior Living Arrangements/Services Living arrangements for the past 2 months: [place] with:: Spouse Patient language and need for interpreter reviewed:: Yes Do you feel safe going back to the place where you live?: Yes      Need for Family Participation in  Patient Care: Yes (Comment) Care giver support system in place?: Yes (comment) Current home services: DME [facility] Rile, wheelchair, cane, shower chair.) Criminal Activity/Legal Involvement Pertinent to Current Situation/Hospitalization: No - Comment as needed  Activities of Daily Living Home Assistive Devices/Equipment: CBG Meter, Eyeglasses, Shower chair without back, Environmental consultant (specify type), Wheelchair, Radio producer (specify quad or straight) ADL Screening (condition at time of admission) Patient's cognitive ability adequate to safely complete daily activities?: Yes Is the patient deaf or have difficulty hearing?: No Does the patient have difficulty seeing, even when wearing glasses/contacts?: No Does the patient have difficulty concentrating, remembering, or making decisions?: No Patient able to express need for assistance with ADLs?: Yes Does the patient have difficulty dressing or bathing?: Yes Independently performs ADLs?: No Communication: Independent Dressing (OT): Dependent Is this a change from baseline?: Change from baseline, expected to last >3 days Grooming: Independent Feeding: Independent Bathing: Dependent Is this a change from baseline?: Change from baseline, expected to last >3 days Toileting: Dependent Is this a change from baseline?: Change from baseline, expected to last >3days In/Out Bed: Dependent Is this a change from baseline?: Change from baseline, expected to last >3 days Walks in Home: Dependent Is this a change from baseline?: Change from baseline, expected to last >3 days Does the patient have difficulty walking or climbing stairs?:  Yes Weakness of Legs: Both Weakness of Arms/Hands: None  Permission Sought/Granted Permission sought to share information with : Case Manager, Customer service manager, Family Supports Permission granted to share information with : Yes, Verbal Permission Granted              Emotional Assessment Appearance:: Appears stated  age Attitude/Demeanor/Rapport: Engaged Affect (typically observed): Accepting, Appropriate, Hopeful Orientation: : Oriented to Self, Oriented to Place, Oriented to  Time, Oriented to Situation Alcohol / Substance Use: Not Applicable Psych Involvement: No (comment)  Admission diagnosis:  Fall [W19.XXXA] Femur fracture, left ([place]) [S72.92XA] Fall, initial encounter [W19.XXXA] Patient Active Problem List   Diagnosis Date Noted   Acute blood loss anemia 11/14/2020   Endotracheally intubated 11/14/2020   Hypotension 11/14/2020   Multiple skin tears 11/14/2020   Immunocompromised state ([place]) 11/14/2020   Current chronic use of systemic steroids 11/14/2020   Shock ([place]) 11/14/2020   Pressure injury of skin 11/14/2020   ESRD on dialysis [facility])    Aspiration into airway    Femur fracture, left ([place]) 11/18/2020   Moderate episode of recurrent major depressive disorder ([place]) 06/29/2020   Hydronephrosis of failed kidney transplant 06/29/2020   No-show for appointment 06/14/2020   Vaginal yeast infection 06/14/2020   Chronic combined systolic and diastolic heart failure ([place]) 01/07/2020   Hyperkalemia    Hydronephrosis    Type 2 diabetes mellitus ([place]) 03/11/2018   Valvular heart disease 03/11/2018   Chronic kidney disease on chronic dialysis ([place]) 11/13/2017   Leukocytosis 09/21/2017   Generalized weakness 09/21/2017   Hyperlipidemia associated with type 2 diabetes mellitus ([place]) 02/22/2017   ESRD (end stage renal disease) ([place]) 08/04/2016   Aortic valve sclerosis 06/08/2015   Anemia of chronic disease 07/03/2013   Essential hypertension 12/18/2010   PCP:  Claretta Fraise, MD Pharmacy:   [facility] [address], [address] [place] [address] Phone: [phone] Fax: [phone]     Social Determinants of Health (SDOH) Interventions    Readmission Risk Interventions Readmission Risk Prevention Plan 01/07/2020 12/19/2018 12/03/2018   Transportation Screening Complete Complete Complete  PCP or Specialist Appt within 3-5 Days - Complete Not Complete  HRI or Home Care Consult - Not Complete Complete  HRI or Home Care Consult comments - Cannot find a [place] agency that will take pt's insurance -  Social Work Consult for [place] Planning/Counseling - Complete Complete  Palliative Care Screening - Not Applicable Not Complete  Medication Review Press photographer) Complete Complete Complete  HRI or Home Care Consult Complete - -  SW Recovery Care/Counseling Consult Complete - -  Palliative Care Screening Not Applicable - -  [place] Not Applicable - -  Some recent data might be hidden

## 2020-11-18 NOTE — Progress Notes (Signed)
ANTICOAGULATION CONSULT NOTE - Initial Consult  Pharmacy Consult for IV heparin Indication: atrial fibrillation  Allergies  Allergen Reactions   Other Other (See Comments)    Tape Bruises and tears skin, Paper tape tolerated   Tape Other (See Comments)    Bruises and tears skin Paper tape tolerated Bruises and tears skin Paper tape tolerated Bruises and tears skin Paper tape tolerated   Renvela [Sevelamer] Nausea And Vomiting    Inability to eat or sleep per spouse. States that all side effects listed for this medication, the patient did experience    Patient Measurements: Height: '5\' 5"'$  (165.1 cm) Weight: 72.9 kg (160 lb 11.5 oz) IBW/kg (Calculated) : 57 Heparin Dosing Weight: 67.9 kg  Vital Signs: Temp: 96.5 F (35.8 C) (09/15 0701) Temp Source: Axillary (09/15 0701) BP: 118/44 (09/15 0900) Pulse Rate: 93 (09/15 0900)  Labs: Recent Labs    11/04/2020 2243 11/16/20 0137 11/17/20 0804 11/17/20 1544 11/17/20 2306 11/18/20 0104 11/18/20 0840  HGB  --    < > 7.0* 8.3*  --   --  8.8*  HCT  --    < > 21.3* 25.7*  --   --  27.5*  PLT  --    < > 150 133*  --   --  142*  APTT 37*  --   --   --   --   --   --   LABPROT 16.6*  --   --   --   --   --   --   INR 1.3*  --   --   --   --   --   --   CREATININE  --   --  8.93* 7.33*  --  5.18*  --   TROPONINIHS  --   --   --   --  81* 77*  --    < > = values in this interval not displayed.    Estimated Creatinine Clearance: 10.7 mL/min (A) (by C-G formula based on SCr of 5.18 mg/dL (H)).   Medical History: Past Medical History:  Diagnosis Date   Abnormal CXR (chest x-ray) 05/25/2019   Abnormal LFTs (liver function tests) 03/11/2018   Acute hypoxemic respiratory failure ([place]) 12/16/2018   Acute on chronic combined systolic and diastolic CHF (congestive heart failure) -EF 40 to 45 % 08/10/2019   Acute respiratory failure with hypoxia (HCC) 12/02/2018   Anemia    of chronic disease   Blood transfusion without reported  diagnosis    Cardiovascular disease    nonobstructive   Carotid artery stenosis 2008   CHF (congestive heart failure) ([place])    Coronary artery disease    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   Last Assessment & Plan:  - Continuing home suppression therapy: Bactrim M-W-F, Prednisone 5 mg daily, Myfortic 360 mg BID and Prograf 1 mg BID - Appreciate Nephrology recs   Diabetes mellitus    Dyslipidemia    Edema of lower extremity    with hypo-albuminemia and profound protenuria   HCAP (healthcare-associated pneumonia)    Heart murmur    Hypertension    Lung collapse 12/15/2018   Mitral regurgitation    Nephrotic syndrome    Patent foramen ovale    Pulmonary edema 08/10/2019   Pulmonary hypertension, moderate to severe (HCC)    Pulmonary nodule    Renal insufficiency    Tricuspid regurgitation    Volume depletion, renal, due to output loss (renal deficit)     Assessment:  65 yo female with afib who was admitted for a left distal femur fracture after a fall. She was on apixaban 2.5 mg twice daily PTA, which was held due to pending surgery and resolved acute bleeding. She had an open reduction internal fixation on 9/11. Pharmacy has been consulted to dose IV heparin.   Goal of Therapy:  Heparin level 0.3-0.7 units/ml Monitor platelets by anticoagulation protocol: Yes   Plan:  Start heparin 1000 units/hr Check a 6h level Monitor daily heparin level, CBC, and s/sx of bleeding  Thank you for including pharmacy in the care of this patient.  Zenaida Deed, PharmD PGY1 Acute Care Pharmacy Resident  Phone: [phone] 11/18/2020  12:36 PM  Please check [email or web address] for unit-specific pharmacy phone numbers.

## 2020-11-18 NOTE — Procedures (Signed)
Extubation Procedure Note  Patient Details:   Name: [name] DOB: 26-Jun-1955 MRN: [id number]   Airway Documentation:    Vent end date: 11/18/20 Vent end time: 1122   Evaluation  O2 sats: stable throughout Complications: No apparent complications Patient did tolerate procedure well. Bilateral Breath Sounds: Diminished, Clear   Yes  Patient extubated per MD order. Positive cuff leak noted. Had some concerns about periods of apnea and patient throwing up prior to extubation. MD made aware. Patient is tolerating well on 6L [place]. RN at bedside. RT will monitor.  Damaree Sargent H Ayeza Therriault 11/18/2020, 11:23 AM

## 2020-11-18 NOTE — Progress Notes (Signed)
eLink Physician-Brief Progress Note Patient Name: [name] DOB: 1955-08-31 MRN: [id number]   Date of Service  11/18/2020  HPI/Events of Note  Pt vomiting; and also failed swallow eval today  eICU Interventions  NGT to be inserted;      Intervention Category Intermediate Interventions: OtherTilden Dome 11/18/2020, 10:12 PM

## 2020-11-18 NOTE — Evaluation (Signed)
Clinical/Bedside Swallow Evaluation Patient Details  Name: [name] MRN: [id number] Date of Birth: 1955-03-23  Today's Date: 11/18/2020 Time: SLP Start Time (ACUTE ONLY): 63 SLP Stop Time (ACUTE ONLY): [id number] SLP Time Calculation (min) (ACUTE ONLY): 16 min  Past Medical History:  Past Medical History:  Diagnosis Date   Abnormal CXR (chest x-ray) 05/25/2019   Abnormal LFTs (liver function tests) 03/11/2018   Acute hypoxemic respiratory failure ([place]) 12/16/2018   Acute on chronic combined systolic and diastolic CHF (congestive heart failure) -EF 40 to 45 % 08/10/2019   Acute respiratory failure with hypoxia ([place]) 12/02/2018   Anemia    of chronic disease   Blood transfusion without reported diagnosis    Cardiovascular disease    nonobstructive   Carotid artery stenosis 2008   CHF (congestive heart failure) ([place])    Coronary artery disease    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   Last Assessment & Plan:  - Continuing home suppression therapy: Bactrim M-W-F, Prednisone 5 mg daily, Myfortic 360 mg BID and Prograf 1 mg BID - Appreciate Nephrology recs   Diabetes mellitus    Dyslipidemia    Edema of lower extremity    with hypo-albuminemia and profound protenuria   HCAP (healthcare-associated pneumonia)    Heart murmur    Hypertension    Lung collapse 12/15/2018   Mitral regurgitation    Nephrotic syndrome    Patent foramen ovale    Pulmonary edema 08/10/2019   Pulmonary hypertension, moderate to severe (HCC)    Pulmonary nodule    Renal insufficiency    Tricuspid regurgitation    Volume depletion, renal, due to output loss (renal deficit)    Past Surgical History:  Past Surgical History:  Procedure Laterality Date   A/V FISTULAGRAM N/A 04/05/2017   Procedure: A/V FISTULAGRAM - Left Arm;  Surgeon: Angelia Mould, MD;  Location: [facility];  Service: Cardiovascular;  Laterality: N/A;   A/V FISTULAGRAM Left 09/18/2017   Procedure: A/V FISTULAGRAM;   Surgeon: Serafina Mitchell, MD;  Location: [facility];  Service: Cardiovascular;  Laterality: Left;   A/V SHUNT INTERVENTION Left 04/05/2017   Procedure: A/V SHUNT INTERVENTION;  Surgeon: Angelia Mould, MD;  Location: [facility];  Service: Cardiovascular;  Laterality: Left;   CARDIAC CATHETERIZATION  2008   IR REMOVAL TUN CV CATH W/O FL  11/02/2016   KIDNEY TRANSPLANT Right 02/23/2009   ORIF FEMUR FRACTURE Left 11/29/2020   Procedure: OPEN REDUCTION INTERNAL FIXATION (ORIF) DISTAL FEMUR FRACTURE;  Surgeon: Shona Needles, MD;  Location: [facility];  Service: Orthopedics;  Laterality: Left;   PERIPHERAL VASCULAR BALLOON ANGIOPLASTY Left 09/18/2017   Procedure: PERIPHERAL VASCULAR BALLOON ANGIOPLASTY;  Surgeon: Serafina Mitchell, MD;  Location: [facility];  Service: Cardiovascular;  Laterality: Left;  Arm Fistula   HPI:  Pt is a 65 year old female who presented with left distal femur fracture after a fall. Attempted HD on 9/9 but aborted due to clotting of AV fistula, femoral catheter placed and dialyzed 9/10. During HD she developed acute respiratory failure, possible aspiration and emergently intubated. ETT 9/10-9/15. Pt s/o ORIF 9/12. CXR 9/11: No well-defined lobar consolidation. Similar appearance of cardiomegaly and interstitial thickening, likely related to clinical history of heart failure. PMH: hypertension, hyperlipidemia, diabetes, afib, systolic and diastolic CHF, end-stage renal disease.    Assessment / Plan / Recommendation  Clinical Impression  Pt was seen for bedside swallow evaluation with her granddaughter present who denied the pt  having a history of dysphagia, but stated that she typically eats soft solids due to limited dentition. Oral mechanism exam was limited due to pt's difficulty following commands; however, oral motor strength and ROM appeared grossly WFL. Dentition was limited with a single maxillary molar and six anterior mandibular teeth. Pt  presented with a hoarse vocal quality and reduced vocal intensity, suggesting vocal fold insufficiency and increasing aspiration risk. Pt was lethargic and the impact of this on her performance is considered. Pt demonstrated symptoms of oropharyngeal dysphagia characterized by impaired bolus awareness, multiple swallows with solids, a suspected oral/pharyngeal delay, and signs of aspiration with ice chips and even small boluses of thin liquids via tsp. Pt reported odynophagia across consistencies. It is recommended that the pt's NPO status be maintained at this time, but very critical meds may be crushed and given in 1/2 tsp boluses of puree. SLP will follow to assess improvement in swallow function and for instrumental assessment as clinically indicated. SLP Visit Diagnosis: Dysphagia, unspecified (R13.10)    Aspiration Risk  Mild aspiration risk;Moderate aspiration risk    Diet Recommendation NPO except meds   Medication Administration: Via alternative means (if very critical may be given crushed in 1/2 tsp boluses of puree) Postural Changes: Seated upright at 90 degrees    Other  Recommendations Oral Care Recommendations: Oral care QID    Recommendations for follow up therapy are one component of a multi-disciplinary discharge planning process, led by the attending physician.  Recommendations may be updated based on patient status, additional functional criteria and insurance authorization.  Follow up Recommendations  (TBD)      Frequency and Duration min 2x/week  2 weeks       Prognosis Prognosis for Safe Diet Advancement: Good      Swallow Study   General Date of Onset: 11/18/20 HPI: Pt is a 65 year old female who presented with left distal femur fracture after a fall. Attempted HD on 9/9 but aborted due to clotting of AV fistula, femoral catheter placed and dialyzed 9/10. During HD she developed acute respiratory failure, possible aspiration and emergently intubated. ETT  9/10-9/15. Pt s/o ORIF 9/12. CXR 9/11: No well-defined lobar consolidation. Similar appearance of cardiomegaly and interstitial thickening, likely related to clinical history of heart failure. PMH: hypertension, hyperlipidemia, diabetes, afib, systolic and diastolic CHF, end-stage renal disease. Type of Study: Bedside Swallow Evaluation Previous Swallow Assessment: none Diet Prior to this Study: NPO Temperature Spikes Noted: No Respiratory Status: Nasal cannula History of Recent Intubation: Yes Length of Intubations (days): 5 days Date extubated: 11/18/20 Behavior/Cognition: Cooperative;Pleasant mood;Lethargic/Drowsy Oral Cavity Assessment: Within Functional Limits Oral Care Completed by SLP: No Oral Cavity - Dentition: Edentulous Vision: Functional for self-feeding Self-Feeding Abilities: Total assist Patient Positioning: Upright in bed;Postural control adequate for testing Baseline Vocal Quality: Hoarse;Low vocal intensity Volitional Cough: Weak Volitional Swallow: Able to elicit    Oral/Motor/Sensory Function Overall Oral Motor/Sensory Function:  (limited but WFL)   Ice Chips Ice chips: Impaired Presentation: Spoon Oral Phase Impairments: Poor awareness of bolus Pharyngeal Phase Impairments: Cough - Immediate   Thin Liquid Thin Liquid: Impaired Presentation: Spoon Pharyngeal  Phase Impairments: Cough - Immediate    Nectar Thick Nectar Thick Liquid: Not tested   Honey Thick Honey Thick Liquid: Not tested   Puree Puree: Impaired Presentation: Spoon Pharyngeal Phase Impairments: Multiple swallows   Solid     Solid: Not tested     Fredrick Dray I. Hardin Negus, [place], [place] Office number [phone] Pager [phone]  Tobie Poet  Ronne Binning 11/18/2020,4:46 PM

## 2020-11-18 NOTE — Progress Notes (Signed)
SLP Cancellation Note  Patient Details Name: [name] MRN: [id number] DOB: 1956/02/27   Cancelled treatment:       Reason Eval/Treat Not Completed: Patient not medically ready (Pt remains intubated at this time. SLP will follow up.)  Jesus Poplin I. Hardin Negus, [place], [place] Office number [phone] Pager [place] 11/18/2020, 8:38 AM

## 2020-11-18 NOTE — Progress Notes (Signed)
NAME:  [name], MRN:  [id number], DOB:  1956/02/27, LOS: 6 ADMISSION DATE:  11/10/2020, CONSULTATION DATE: 11/13/2020 REFERRING MD: Heath Lark DO, CHIEF COMPLAINT:  L femur fracture  History of Present Illness:  65 year old with hypertension, hyperlipidemia, diabetes, afib, systolic and diastolic CHF, end-stage renal disease s/p failed transplant presenting with left distal femur fracture after a fall.  Ortho is planning for OR intervention on 9/12 at [facility]  Attempted hemodialysis on 9/9 but aborted due to clotting of AV fistula, unable to return 300 cc of blood. She then had a femoral catheter placed and dialyzed today 9/10 with 1 unit of blood due to blood loss anemia.  During dialysis she developed acute respiratory failure, possible aspiration and emergently intubated.    PCCM consulted for transfer to [facility] for further management  Pertinent  Medical History   Past Medical History:  Diagnosis Date   Abnormal CXR (chest x-ray) 05/25/2019   Abnormal LFTs (liver function tests) 03/11/2018   Acute hypoxemic respiratory failure ([place]) 12/16/2018   Acute on chronic combined systolic and diastolic CHF (congestive heart failure) -EF 40 to 45 % 08/10/2019   Acute respiratory failure with hypoxia ([place]) 12/02/2018   Anemia    of chronic disease   Blood transfusion without reported diagnosis    Cardiovascular disease    nonobstructive   Carotid artery stenosis 2008   CHF (congestive heart failure) ([place])    Coronary artery disease    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   Last Assessment & Plan:  - Continuing home suppression therapy: Bactrim M-W-F, Prednisone 5 mg daily, Myfortic 360 mg BID and Prograf 1 mg BID - Appreciate Nephrology recs   Diabetes mellitus    Dyslipidemia    Edema of lower extremity    with hypo-albuminemia and profound protenuria   HCAP (healthcare-associated pneumonia)    Heart murmur    Hypertension    Lung collapse 12/15/2018   Mitral  regurgitation    Nephrotic syndrome    Patent foramen ovale    Pulmonary edema 08/10/2019   Pulmonary hypertension, moderate to severe [facility])    Pulmonary nodule    Renal insufficiency    Tricuspid regurgitation    Volume depletion, renal, due to output loss (renal deficit)   Afib rvr 07/2020 on amiodarone  Significant Hospital Events: Including procedures, antibiotic start and stop dates in addition to other pertinent events   9/9 Admitted after a fall with femur fracture, HD catheter placed due to a clotted AV fistula 9/10 intubated for acute respiratory failure, aspiration 9/11: Transferred to [facility]U 9/12: ORIF  Interim History / Subjective:  Patient remains intubated and on fentanyl drip. Still requiring 8 of Levophed to maintain pressures but on CRRT. Opens eyes and nods able to follow some commands. QTC found to be elevated overnight with trop downtrending. Objective   Blood pressure 129/88, pulse (!) 109, temperature (!) 96 F (35.6 C), temperature source Axillary, resp. rate 12, height '5\' 5"'$  (1.651 m), weight 72.9 kg, SpO2 100 %.    Vent Mode: PRVC FiO2 (%):  [40 %] 40 % Set Rate:  [14 bmp] 14 bmp Vt Set:  [450 mL] 450 mL PEEP:  [5 cmH20] 5 cmH20 Plateau Pressure:  [12 cmH20-22 cmH20] 22 cmH20   Intake/Output Summary (Last 24 hours) at 11/18/2020 0736 Last data filed at 11/18/2020 0700 Gross per 24 hour  Intake 2498.37 ml  Output 2541 ml  Net -42.63 ml   Filed Weights   11/27/2020 0422  11/16/20 0443 11/18/20 0204  Weight: 59.7 kg 70.6 kg 72.9 kg    Examination:  General:  critically ill appearing on mech vent, Eyes open and tracking, agitated by ET tube HEENT: MM pink/moist; ETT in place Neuro: on fentynl drip only, no other sedation, eyes open and tracking, nods to questions and able to follow some commands. CV: tachycardic, afib on monitor, no m/r/g PULM:  lung apices clear BS bilaterally; on mech vent PS at 40% FIO2 GI: soft, non tender, non  distended Extremities: slightly cool bilateral feet, no edema; bandage on left leg  Resolved Hospital Problem list     Assessment & Plan:  Fall, left femur fracture -per ortho -dressing in place  Acute respiratory failure w/ hypoxia Possible aspiration pneumonia WBC improving. Culture with rare staph aures. Remains afebrile. -continue mechanical ventilation with plan to work toward extubation after IR intervention/CRRT -VAP prevention in place -wean fio2 for sats >90% -daily sbt/sat. Working toward extubation today - Staph Aureus culture is pan sensitive but on last day of zosyn course -Complete zosyn course today for aspiration for 5 days (9/10 - 9/15) -trend CBC/fever curve  Acute Blood Loss Anemia Shock: likely hypovolemic/ vasoplegic post surgery. S/p 4 units PRBC and 1L fluids. Unable to tolerate lower doses of levophed at this time. Hemoglobin stabilizing - wean levophed to map > 65 - Adding midodrine  - transfuse for hgb <7 - infectious workup as above - monitor H and H  Acute encephalopathy secondary to intradialytic hypotension -limited improvement in mental status likely due to build up of metabolic causes. Working toward extubation today  ESRD: on HD MWF S/p failed renal transplant Hyperkalemia- Resolved with diaylsis Hyperphosphoremia -nephro following -Failed dialysis 9/9 due to clotted AV fistula -Will be stable for IR intervention on 9/16. -Monitor BMP - Per nephro: resume calcitriol and renvela when able to tolerate PO - holding prograf for now, awaiting nephrology consultation - Currently on CRRT  Chronic systolic and diastolic CHF: LVEF [id number] - caution with additional IV fluids - hold home metoprolol  Hx HTN and HLD -continue statin -holding home meds in setting of hypotension  Hx of afib Prolonged QTC Patient remains in Afib and tachycardic despite home dose of amiodarone.  -continue amiodarone -holding eliquis until hemoglobin  stable -holding metoprolol in setting of hypotension - Avoid QT prolonging medications - Planning to extubate to see if that will help QTC  T2DM w/ nephropathy and gastroparesis -SSI and CBG monitoring -continue reglan  Hx of generalized anxiety disorder P: -continue abilify and zoloft -will hold mirtazapine until extubated  Best Practice (right click and "Reselect all SmartList Selections" daily)   Diet/type: NPO w/ meds via tube DVT prophylaxis: SCD; holding home eliquis until hemoglobin stable GI prophylaxis: PPI Lines: Dialysis Catheter Foley:  N/A Code Status:  full code Last date of multidisciplinary goals of care discussion [pending]  Labs   CBC: Recent Labs  Lab 11/11/2020 1015 11/13/20 0548 11/14/20 2146 11/17/2020 2032 11/16/20 0137 11/16/20 2340 11/17/20 0804 11/17/20 1544  WBC 19.2*   < > 29.4* 31.1*  --  23.6* 23.8* 24.0*  NEUTROABS 16.1*  --   --   --   --  21.4* 20.7*  --   HGB 7.4*   < > 9.1* 7.5* 7.3* 7.6* 7.0* 8.3*  HCT 25.0*   < > 28.6* 24.1* 23.0* 23.1* 21.3* 25.7*  MCV 96.5   < > 90.8 92.0  --  89.2 89.1 90.2  PLT 227   < >  194 198  --  152 150 133*   < > = values in this interval not displayed.    Basic Metabolic Panel: Recent Labs  Lab 11/14/20 2146 11/26/2020 1700 11/30/2020 2032 11/16/20 0137 11/16/20 2340 11/17/20 0245 11/17/20 0804 11/17/20 1544 11/18/20 0104  NA 129*  --  128*  --   --   --  125* 127* 130*  K 4.0  --  4.1  --   --   --  4.3 4.2 4.6  CL 94*  --  93*  --   --   --  88* 90* 96*  CO2 17*  --  18*  --   --   --  18* 17* 21*  GLUCOSE 176*  --  265*  --   --   --  204* 214* 220*  BUN 34*  --  46*  --   --   --  58* 52* 36*  CREATININE 6.83*  --  7.98*  --   --   --  8.93* 7.33* 5.18*  CALCIUM 6.9*  --  6.9*  --   --   --  7.0* 6.6* 7.2*  MG 1.8  --  2.0 2.0 2.2 2.1 2.0  --  2.3  PHOS  --    < >  --  9.7* 10.2* 9.8*  --  9.2* 6.3*   < > = values in this interval not displayed.   GFR: Estimated Creatinine Clearance:  10.7 mL/min (A) (by C-G formula based on SCr of 5.18 mg/dL (H)). Recent Labs  Lab 11/13/20 1330 11/14/20 0400 11/14/20 0510 11/14/20 0745 11/14/20 1413 11/14/20 2146 11/23/2020 2032 11/16/20 2340 11/17/20 0804 11/17/20 1544  PROCALCITON 3.92  --   --   --   --   --   --   --   --   --   WBC  --    < >  --    < >  --    < > 31.1* 23.6* 23.8* 24.0*  LATICACIDVEN  --   --  2.3*  --  1.7  --   --   --   --   --    < > = values in this interval not displayed.    Liver Function Tests: Recent Labs  Lab 11/11/2020 1015 11/14/20 0401 11/14/20 0746 11/17/20 0804 11/17/20 1544 11/18/20 0104  AST 27  --   --  21  --   --   ALT 34  --   --  8  --   --   ALKPHOS 101  --   --  60  --   --   BILITOT 0.5  --   --  1.2  --   --   PROT 6.6  --   --  5.1*  --   --   ALBUMIN 2.7* TEST WILL BE CREDITED 2.3* 1.6* 1.7* 1.7*   No results for input(s): LIPASE, AMYLASE in the last 168 hours. No results for input(s): AMMONIA in the last 168 hours.  ABG    Component Value Date/Time   PHART 7.421 11/13/2020 2309   PCO2ART 28.4 (L) 11/13/2020 2309   PO2ART 147 (H) 11/13/2020 2309   HCO3 20.4 11/13/2020 2309   TCO2 28 07/20/2019 1354   ACIDBASEDEF 5.5 (H) 11/13/2020 2309   O2SAT 98.7 11/13/2020 2309     Coagulation Profile: Recent Labs  Lab 11/08/2020 2243  INR 1.3*    Cardiac Enzymes: No results for input(s): CKTOTAL, CKMB,  CKMBINDEX, TROPONINI in the last 168 hours.  HbA1C: HB A1C (BAYER DCA - WAIVED)  Date/Time Value Ref Range Status  10/19/2020 09:41 AM 6.7 <7.0 % Final    Comment:                                          Diabetic Adult            <7.0                                       Healthy Adult        4.3 - 5.7                                                           (DCCT/NGSP) American Diabetes Association's Summary of Glycemic Recommendations for Adults with Diabetes: Hemoglobin A1c <7.0%. More stringent glycemic goals (A1c <6.0%) may further reduce complications at  the cost of increased risk of hypoglycemia.   10/01/2018 11:48 AM 9.7 (H) <7.0 % Final    Comment:                                          Diabetic Adult            <7.0                                       Healthy Adult        4.3 - 5.7                                                           (DCCT/NGSP) American Diabetes Association's Summary of Glycemic Recommendations for Adults with Diabetes: Hemoglobin A1c <7.0%. More stringent glycemic goals (A1c <6.0%) may further reduce complications at the cost of increased risk of hypoglycemia.    Hgb A1c MFr Bld  Date/Time Value Ref Range Status  11/29/2020 10:15 AM 6.7 (H) 4.8 - 5.6 % Final    Comment:    (NOTE) Pre diabetes:          5.7%-6.4%  Diabetes:              >6.4%  Glycemic control for   <7.0% adults with diabetes   01/06/2020 04:34 PM 8.7 (H) 4.8 - 5.6 % Final    Comment:    (NOTE) Pre diabetes:          5.7%-6.4%  Diabetes:              >6.4%  Glycemic control for   <7.0% adults with diabetes     CBG: Recent Labs  Lab 11/17/20 1611 11/17/20 1932 11/17/20 2341 11/18/20 0356 11/18/20 0732  GLUCAP 203* 188* 224* 174* 129*      Critical  care time:     Reece Agar, MS4

## 2020-11-18 NOTE — Progress Notes (Signed)
Orthopaedic Trauma Progress Note  SUBJECTIVE: Patient remains intubated and sedated. Does open eyes to voice. Not able to follow all commands. No acute issues overnight from ortho standpoint.  OBJECTIVE:  Vitals:   11/18/20 0751 11/18/20 0800  BP:  (!) 125/49  Pulse: (!) 102 (!) 108  Resp: 20 14  Temp:    SpO2: 100% 100%    General: Intubated. Opens eyes and nods head appropriately to questions.  Respiratory: No increased work of breathing.  LLE: Dressing CDI. Foot slightly cool but equal to contralateral side. Open eyes to voice but unable to obtain reliable motor or sensory exam.   IMAGING: Stable post op imaging.   LABS:  Results for orders placed or performed during the hospital encounter of 11/27/2020 (from the past 24 hour(s))  Glucose, capillary     Status: Abnormal   Collection Time: 11/17/20 11:52 AM  Result Value Ref Range   Glucose-Capillary 199 (H) 70 - 99 mg/dL   Comment 1 Notify RN   Renal function panel (daily at 1600)     Status: Abnormal   Collection Time: 11/17/20  3:44 PM  Result Value Ref Range   Sodium 127 (L) 135 - 145 mmol/L   Potassium 4.2 3.5 - 5.1 mmol/L   Chloride 90 (L) 98 - 111 mmol/L   CO2 17 (L) 22 - 32 mmol/L   Glucose, Bld 214 (H) 70 - 99 mg/dL   BUN 52 (H) 8 - 23 mg/dL   Creatinine, Ser 7.33 (H) 0.44 - 1.00 mg/dL   Calcium 6.6 (L) 8.9 - 10.3 mg/dL   Phosphorus 9.2 (H) 2.5 - 4.6 mg/dL   Albumin 1.7 (L) 3.5 - 5.0 g/dL   GFR, Estimated 6 (L) >60 mL/min   Anion gap 20 (H) 5 - 15  CBC     Status: Abnormal   Collection Time: 11/17/20  3:44 PM  Result Value Ref Range   WBC 24.0 (H) 4.0 - 10.5 K/uL   RBC 2.85 (L) 3.87 - 5.11 MIL/uL   Hemoglobin 8.3 (L) 12.0 - 15.0 g/dL   HCT 25.7 (L) 36.0 - 46.0 %   MCV 90.2 80.0 - 100.0 fL   MCH 29.1 26.0 - 34.0 pg   MCHC 32.3 30.0 - 36.0 g/dL   RDW 16.5 (H) 11.5 - 15.5 %   Platelets 133 (L) 150 - 400 K/uL   nRBC 0.1 0.0 - 0.2 %  Glucose, capillary     Status: Abnormal   Collection Time: 11/17/20  4:11  PM  Result Value Ref Range   Glucose-Capillary 203 (H) 70 - 99 mg/dL  Glucose, capillary     Status: Abnormal   Collection Time: 11/17/20  7:32 PM  Result Value Ref Range   Glucose-Capillary 188 (H) 70 - 99 mg/dL  Troponin I (High Sensitivity)     Status: Abnormal   Collection Time: 11/17/20 11:06 PM  Result Value Ref Range   Troponin I (High Sensitivity) 81 (H) <18 ng/L  Glucose, capillary     Status: Abnormal   Collection Time: 11/17/20 11:41 PM  Result Value Ref Range   Glucose-Capillary 224 (H) 70 - 99 mg/dL  Renal function panel (daily at 0500)     Status: Abnormal   Collection Time: 11/18/20  1:04 AM  Result Value Ref Range   Sodium 130 (L) 135 - 145 mmol/L   Potassium 4.6 3.5 - 5.1 mmol/L   Chloride 96 (L) 98 - 111 mmol/L   CO2 21 (L) 22 - 32 mmol/L  Glucose, Bld 220 (H) 70 - 99 mg/dL   BUN 36 (H) 8 - 23 mg/dL   Creatinine, Ser 5.18 (H) 0.44 - 1.00 mg/dL   Calcium 7.2 (L) 8.9 - 10.3 mg/dL   Phosphorus 6.3 (H) 2.5 - 4.6 mg/dL   Albumin 1.7 (L) 3.5 - 5.0 g/dL   GFR, Estimated 9 (L) >60 mL/min   Anion gap 13 5 - 15  Troponin I (High Sensitivity)     Status: Abnormal   Collection Time: 11/18/20  1:04 AM  Result Value Ref Range   Troponin I (High Sensitivity) 77 (H) <18 ng/L  Magnesium     Status: None   Collection Time: 11/18/20  1:04 AM  Result Value Ref Range   Magnesium 2.3 1.7 - 2.4 mg/dL  Glucose, capillary     Status: Abnormal   Collection Time: 11/18/20  3:56 AM  Result Value Ref Range   Glucose-Capillary 174 (H) 70 - 99 mg/dL  Glucose, capillary     Status: Abnormal   Collection Time: 11/18/20  7:32 AM  Result Value Ref Range   Glucose-Capillary 129 (H) 70 - 99 mg/dL    ASSESSMENT: [name] is a 65 y.o. female, 3 Days Post-Op s/p ORIF LEFT DISTAL FEMUR FRACTURE  CV/Blood loss: Acute blood loss anemia, last drawn Hgb 8.3 on 11/17/20 CBC pending this AM. Received additional 1 unit PRBCs yesterday  PLAN: Weightbearing: TDWB LLE Incisional and  dressing care: Reinforce dressings as needed. Plan to remove dressing tomorrow and leave open to air if no drainage Showering: Bed bath Orthopedic device(s):  None Pain management: per primary team VTE prophylaxis: Heparin, SCDs ID: On Zosyn per primary team.   Foley/Lines:  No foley, KVO IVFs Impediments to Fracture Healing: Vit D level 9, start on D2 supplementation  Dispo: Intubated currently. PT/OT once able.   Follow - up plan:  TBD  Contact information:  Katha Hamming MD, Patrecia Pace PA-C. After hours and holidays please check [email or web address] for group call information for [facility]   Kensley Valladares A. Ricci Barker, PA-C [phone] (office) [email or web address]

## 2020-11-18 NOTE — Progress Notes (Signed)
Abd drwan, results given to [place] MD.  Pt placed on NRB per pa02.  Pt tolerating well

## 2020-11-18 NOTE — Progress Notes (Signed)
ANTICOAGULATION CONSULT NOTE - Initial Consult  Pharmacy Consult for IV heparin Indication: atrial fibrillation  Allergies  Allergen Reactions   Other Other (See Comments)    Tape Bruises and tears skin, Paper tape tolerated   Tape Other (See Comments)    Bruises and tears skin Paper tape tolerated Bruises and tears skin Paper tape tolerated Bruises and tears skin Paper tape tolerated   Renvela [Sevelamer] Nausea And Vomiting    Inability to eat or sleep per spouse. States that all side effects listed for this medication, the patient did experience    Patient Measurements: Height: '5\' 5"'$  (165.1 cm) Weight: 72.9 kg (160 lb 11.5 oz) IBW/kg (Calculated) : 57 Heparin Dosing Weight: 67.9 kg  Vital Signs: Temp: 92.8 F (33.8 C) (09/15 1750) Temp Source: Axillary (09/15 1750) BP: 129/79 (09/15 1900) Pulse Rate: 83 (09/15 1900)  Labs: Recent Labs    11/08/2020 2243 11/16/20 0137 11/17/20 0804 11/17/20 1544 11/17/20 2306 11/18/20 0104 11/18/20 0840 11/18/20 1810  HGB  --    < > 7.0* 8.3*  --   --  8.8*  --   HCT  --    < > 21.3* 25.7*  --   --  27.5*  --   PLT  --    < > 150 133*  --   --  142*  --   APTT 37*  --   --   --   --   --   --   --   LABPROT 16.6*  --   --   --   --   --   --   --   INR 1.3*  --   --   --   --   --   --   --   HEPARINUNFRC  --   --   --   --   --   --   --  0.12*  CREATININE  --   --  8.93* 7.33*  --  5.18*  --   --   TROPONINIHS  --   --   --   --  81* 77*  --   --    < > = values in this interval not displayed.     Estimated Creatinine Clearance: 10.7 mL/min (A) (by C-G formula based on SCr of 5.18 mg/dL (H)).   Medical History: Past Medical History:  Diagnosis Date   Abnormal CXR (chest x-ray) 05/25/2019   Abnormal LFTs (liver function tests) 03/11/2018   Acute hypoxemic respiratory failure ([place]) 12/16/2018   Acute on chronic combined systolic and diastolic CHF (congestive heart failure) -EF 40 to 45 % 08/10/2019   Acute respiratory  failure with hypoxia ([facility]) 12/02/2018   Anemia    of chronic disease   Blood transfusion without reported diagnosis    Cardiovascular disease    nonobstructive   Carotid artery stenosis 2008   CHF (congestive heart failure) ([place])    Coronary artery disease    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   Last Assessment & Plan:  - Continuing home suppression therapy: Bactrim M-W-F, Prednisone 5 mg daily, Myfortic 360 mg BID and Prograf 1 mg BID - Appreciate Nephrology recs   Diabetes mellitus    Dyslipidemia    Edema of lower extremity    with hypo-albuminemia and profound protenuria   HCAP (healthcare-associated pneumonia)    Heart murmur    Hypertension    Lung collapse 12/15/2018   Mitral regurgitation    Nephrotic syndrome  Patent foramen ovale    Pulmonary edema 08/10/2019   Pulmonary hypertension, moderate to severe ([facility])    Pulmonary nodule    Renal insufficiency    Tricuspid regurgitation    Volume depletion, renal, due to output loss (renal deficit)     Assessment: 65 yo female with afib who was admitted for a left distal femur fracture after a fall. She was on apixaban 2.5 mg twice daily PTA, which was held due to pending surgery and resolved acute bleeding. She had an open reduction internal fixation on 9/11. Pharmacy has been consulted to dose IV heparin.  9/15 Heparin level 0.12 (on 1000 units/hr) Discussed with nurse, no signs/symptoms of bleeding *patient does have a history of bleeding on heparin in the past so will adjust cautiously  Goal of Therapy:  Heparin level 0.3-0.7 units/ml Monitor platelets by anticoagulation protocol: Yes   Plan:  Increase to heparin 1150 units/hr without a bolus Check a level with am labs Monitor daily heparin level, CBC, and s/sx of bleeding   Donnald Garre, PharmD Clinical Pharmacist  Please check AMION for all [place] numbers After 10:00 PM, call [place] [phone]

## 2020-11-18 NOTE — Progress Notes (Signed)
eLink Physician-Brief Progress Note Patient Name: [name] DOB: [birth year] MRN: [id number]   Date of Service  11/18/2020  HPI/Events of Note  Pt noted to have agonal breathing after NGT inserted.  We attached to wall suction and > 800cc bright yellow bilious fluid quickly came out.  Pt still with an abnormal breathing pattern but is responsive to voice and is oriented.  ABG notes only a mild acidemia and a PaCO2 in the 40's.  eICU Interventions  Keep NGT to low intermittent suction.  Serial exams.  High risk of needing to be re-intubated.  Keep NPO     Intervention Category Major Interventions: Respiratory failure - evaluation and management  [place] Dome 11/18/2020, 11:30 PM

## 2020-11-18 NOTE — Progress Notes (Signed)
[facility] NEPHROLOGY PROGRESS NOTE  Assessment/ Plan: Pt is a 65 y.o. yo female  with history of hypertension, HLD, type II DM, CHF, ESRD on HD MWF, h/o failed kidney transplant who presented to the [facility] with a left knee pain after a fall, seen as a consult for the management of ESRD.  She developed respiratory distress requiring intubation and transferred to [facility] ICU.  [facility]-  orders are as follows  MWF-  4 hours AVF-  16 gauge- 300 BFR 2/2.5 bath/300 rev/EDW 68.5-  no heparin Calcitriol 0.75 TIW, epo 12,000 TIW and venofer 50 weekly   #Comminuted distal left femur fracture after the fall: ortho on board, s/p left ORIF 9/12. With oozing still, platelet dysfunction 2/2 esrd? Will see if renal replacement therapy helps   # ESRD: MWF at [facility]:  -started CRRT 9/14 given persistent shock/pressor requirement -c/w CRRT. UF goal: net even 50-100cc/hr  #Vascular access: clotted AVF, IR has already been consulted for possible fgram and TDC placement once medically stable. Has a right fem temp line in the meantime   #Hyperkalemia: Improved after HD.  #Hyponatremia -will manage with renal replacement therapy, improved   #Hypotension/shock, intradialytic hypotension.  Required midodrine, albumin during HD.  Currently she is on Levophed   # Anemia of ESRD: Probably some blood loss.  Receiving blood transfusion.  No iron because of infection and on antibiotics.  Continue ESA.  # Metabolic Bone Disease: Monitor calcium phosphorus level.  Resume calcitriol, Renvela when able to take orally PO  Subjective: Seen and examined ICU and on CRRT. No acute events, remains on levo. Objective Vital signs in last 24 hours: Vitals:   11/18/20 0645 11/18/20 0700 11/18/20 0701 11/18/20 0751  BP: (!) 114/48 129/88    Pulse: (!) 102 (!) 109  (!) 102  Resp: $Remo'13 12  20  'GwdCf$ Temp:   (!) 96.5 F (35.8 C)   TempSrc:   Axillary   SpO2: 100% 100%  100%  Weight:      Height:        Weight change:   Intake/Output Summary (Last 24 hours) at 11/18/2020 0807 Last data filed at 11/18/2020 0700 Gross per 24 hour  Intake 2498.37 ml  Output 2541 ml  Net -42.63 ml       Labs: Basic Metabolic Panel: Recent Labs  Lab 11/17/20 0245 11/17/20 0804 11/17/20 1544 11/18/20 0104  NA  --  125* 127* 130*  K  --  4.3 4.2 4.6  CL  --  88* 90* 96*  CO2  --  18* 17* 21*  GLUCOSE  --  204* 214* 220*  BUN  --  58* 52* 36*  CREATININE  --  8.93* 7.33* 5.18*  CALCIUM  --  7.0* 6.6* 7.2*  PHOS 9.8*  --  9.2* 6.3*   Liver Function Tests: Recent Labs  Lab 11/30/2020 1015 11/14/20 0401 11/17/20 0804 11/17/20 1544 11/18/20 0104  AST 27  --  21  --   --   ALT 34  --  8  --   --   ALKPHOS 101  --  60  --   --   BILITOT 0.5  --  1.2  --   --   PROT 6.6  --  5.1*  --   --   ALBUMIN 2.7*   < > 1.6* 1.7* 1.7*   < > = values in this interval not displayed.   No results for input(s): LIPASE, AMYLASE in the  last 168 hours. No results for input(s): AMMONIA in the last 168 hours. CBC: Recent Labs  Lab 11/26/2020 1015 11/13/20 0548 11/14/20 2146 11/27/2020 2032 11/16/20 0137 11/16/20 2340 11/17/20 0804 11/17/20 1544  WBC 19.2*   < > 29.4* 31.1*  --  23.6* 23.8* 24.0*  NEUTROABS 16.1*  --   --   --   --  21.4* 20.7*  --   HGB 7.4*   < > 9.1* 7.5*   < > 7.6* 7.0* 8.3*  HCT 25.0*   < > 28.6* 24.1*   < > 23.1* 21.3* 25.7*  MCV 96.5   < > 90.8 92.0  --  89.2 89.1 90.2  PLT 227   < > 194 198  --  152 150 133*   < > = values in this interval not displayed.   Cardiac Enzymes: No results for input(s): CKTOTAL, CKMB, CKMBINDEX, TROPONINI in the last 168 hours. CBG: Recent Labs  Lab 11/17/20 1611 11/17/20 1932 11/17/20 2341 11/18/20 0356 11/18/20 0732  GLUCAP 203* 188* 224* 174* 129*    Iron Studies:  No results for input(s): IRON, TIBC, TRANSFERRIN, FERRITIN in the last 72 hours.  Studies/Results: No results found.  Medications: Infusions:   prismasol BGK 4/2.5  300 mL/hr at 11/18/20 0304   sodium chloride     feeding supplement (VITAL 1.5 CAL) 50 mL/hr at 11/18/20 0100   fentaNYL infusion INTRAVENOUS 150 mcg/hr (11/18/20 0700)   norepinephrine (LEVOPHED) Adult infusion 8 mcg/min (11/18/20 0700)   piperacillin-tazobactam Stopped (11/18/20 0634)   prismasol BGK 2/2.5 replacement solution 500 mL/hr at 11/17/20 2330   prismasol BGK 4/2.5 1,500 mL/hr at 11/18/20 9450    Scheduled Medications:  sodium chloride   Intravenous Once   amiodarone  100 mg Per Tube Daily   aspirin  81 mg Per Tube Daily   calcitRIOL  0.75 mcg Per Tube Q T,Th,Sat-1800   chlorhexidine gluconate (MEDLINE KIT)  15 mL Mouth Rinse BID   Chlorhexidine Gluconate Cloth  6 each Topical Q0600   darbepoetin (ARANESP) injection - DIALYSIS  40 mcg Intravenous Once   docusate  100 mg Per Tube BID   feeding supplement (PROSource TF)  45 mL Per Tube Daily   insulin aspart  0-6 Units Subcutaneous Q4H   insulin glargine-yfgn  10 Units Subcutaneous Daily   mouth rinse  15 mL Mouth Rinse 10 times per day   midodrine  10 mg Per Tube Q8H   multivitamin  1 tablet Per Tube Daily   pantoprazole (PROTONIX) IV  40 mg Intravenous Q24H   polyethylene glycol  17 g Per Tube Daily   predniSONE  5 mg Per Tube Q breakfast   senna  1 tablet Per Tube BID   sertraline  100 mg Per Tube QHS   sevelamer carbonate  3,200 mg Per Tube TID with meals   sodium chloride flush  10-40 mL Intracatheter Q12H   sodium chloride flush  3 mL Intravenous Q12H   Thrombi-Pad  2 each Topical Once   Vitamin D (Ergocalciferol)  50,000 Units Per Tube Q7 days    have reviewed scheduled and prn medications.  Physical Exam: General: Ill-looking female intubated Heart:RRR, s1s2 nl Lungs: cta bl Abdomen:soft,  non-distended Extremities:+ edema b/l le Neuro: opening eyes on vocal stim Dialysis Access: Right groin catheter in place, left upper extremity AV fistula has no bruit or thrill.  Silvio Sausedo 11/18/2020,8:07 AM   LOS: 6 days

## 2020-11-18 NOTE — Progress Notes (Signed)
eLink Physician-Brief Progress Note Patient Name: [name] DOB: 06/22/1955 MRN: [id number]   Date of Service  11/18/2020  HPI/Events of Note  Interval troponin 77, down from 81.  eICU Interventions  No intervention.        Kerry Kass Crespin Forstrom 11/18/2020, 3:57 AM

## 2020-11-18 NOTE — Progress Notes (Signed)
AXR showing ileus Repeat EKG shows improved QT length Will start some low dose reglan Continue NPO, low threshold for NG to LIS RD recommends TPN for now which I guess we will have to consider starting tomorrow.  Erskine Emery MD PCCM

## 2020-11-18 NOTE — Progress Notes (Signed)
eLink Physician-Brief Progress Note Patient Name: [name] DOB: 1955/04/13 MRN: [id number]   Date of Service  11/18/2020  HPI/Events of Note  Troponin # 1 81  eICU Interventions  Follow up interval  Troponin result.        [name] 11/18/2020, 1:31 AM

## 2020-11-19 ENCOUNTER — Inpatient Hospital Stay (HOSPITAL_COMMUNITY): Payer: Medicare HMO

## 2020-11-19 DIAGNOSIS — J9600 Acute respiratory failure, unspecified whether with hypoxia or hypercapnia: Secondary | ICD-10-CM | POA: Diagnosis not present

## 2020-11-19 DIAGNOSIS — Z95828 Presence of other vascular implants and grafts: Secondary | ICD-10-CM

## 2020-11-19 DIAGNOSIS — E440 Moderate protein-calorie malnutrition: Secondary | ICD-10-CM | POA: Insufficient documentation

## 2020-11-19 DIAGNOSIS — R579 Shock, unspecified: Secondary | ICD-10-CM | POA: Diagnosis not present

## 2020-11-19 LAB — CBC
HCT: 28.1 % — ABNORMAL LOW (ref 36.0–46.0)
Hemoglobin: 8.6 g/dL — ABNORMAL LOW (ref 12.0–15.0)
MCH: 29 pg (ref 26.0–34.0)
MCHC: 30.6 g/dL (ref 30.0–36.0)
MCV: 94.6 fL (ref 80.0–100.0)
Platelets: 132 10*3/uL — ABNORMAL LOW (ref 150–400)
RBC: 2.97 MIL/uL — ABNORMAL LOW (ref 3.87–5.11)
RDW: 18.2 % — ABNORMAL HIGH (ref 11.5–15.5)
WBC: 39.8 10*3/uL — ABNORMAL HIGH (ref 4.0–10.5)
nRBC: 0.2 % (ref 0.0–0.2)

## 2020-11-19 LAB — BASIC METABOLIC PANEL
Anion gap: 19 — ABNORMAL HIGH (ref 5–15)
BUN: 10 mg/dL (ref 8–23)
CO2: 14 mmol/L — ABNORMAL LOW (ref 22–32)
Calcium: 7.4 mg/dL — ABNORMAL LOW (ref 8.9–10.3)
Chloride: 101 mmol/L (ref 98–111)
Creatinine, Ser: 1.53 mg/dL — ABNORMAL HIGH (ref 0.44–1.00)
GFR, Estimated: 37 mL/min — ABNORMAL LOW (ref >60)
Glucose, Bld: 155 mg/dL — ABNORMAL HIGH (ref 70–99)
Potassium: 5.8 mmol/L — ABNORMAL HIGH (ref 3.5–5.1)
Sodium: 134 mmol/L — ABNORMAL LOW (ref 135–145)

## 2020-11-19 LAB — POCT I-STAT 7, (LYTES, BLD GAS, ICA,H+H)
Acid-base deficit: 3 mmol/L — ABNORMAL HIGH (ref 0.0–2.0)
Acid-base deficit: 12 mmol/L — ABNORMAL HIGH (ref 0.0–2.0)
Acid-base deficit: 16 mmol/L — ABNORMAL HIGH (ref 0.0–2.0)
Bicarbonate: 23.6 mmol/L (ref 20.0–28.0)
Bicarbonate: 15.8 mmol/L — ABNORMAL LOW (ref 20.0–28.0)
Bicarbonate: 12.7 mmol/L — ABNORMAL LOW (ref 20.0–28.0)
Calcium, Ion: 1.11 mmol/L — ABNORMAL LOW (ref 1.15–1.40)
Calcium, Ion: 1.05 mmol/L — ABNORMAL LOW (ref 1.15–1.40)
Calcium, Ion: 1 mmol/L — ABNORMAL LOW (ref 1.15–1.40)
HCT: 28 % — ABNORMAL LOW (ref 36.0–46.0)
HCT: 26 % — ABNORMAL LOW (ref 36.0–46.0)
HCT: 26 % — ABNORMAL LOW (ref 36.0–46.0)
Hemoglobin: 9.5 g/dL — ABNORMAL LOW (ref 12.0–15.0)
Hemoglobin: 8.8 g/dL — ABNORMAL LOW (ref 12.0–15.0)
Hemoglobin: 8.8 g/dL — ABNORMAL LOW (ref 12.0–15.0)
O2 Saturation: 90 %
O2 Saturation: 89 %
O2 Saturation: 87 %
Patient temperature: 97
Patient temperature: 97.6
Patient temperature: 97.6
Potassium: 4.3 mmol/L (ref 3.5–5.1)
Potassium: 5.4 mmol/L — ABNORMAL HIGH (ref 3.5–5.1)
Potassium: 6.2 mmol/L — ABNORMAL HIGH (ref 3.5–5.1)
Sodium: 135 mmol/L (ref 135–145)
Sodium: 132 mmol/L — ABNORMAL LOW (ref 135–145)
Sodium: 133 mmol/L — ABNORMAL LOW (ref 135–145)
TCO2: 25 mmol/L (ref 22–32)
TCO2: 17 mmol/L — ABNORMAL LOW (ref 22–32)
TCO2: 14 mmol/L — ABNORMAL LOW (ref 22–32)
pCO2 arterial: 45.9 mmHg (ref 32.0–48.0)
pCO2 arterial: 43.9 mmHg (ref 32.0–48.0)
pCO2 arterial: 39 mmHg (ref 32.0–48.0)
pH, Arterial: 7.315 — ABNORMAL LOW (ref 7.350–7.450)
pH, Arterial: 7.162 — CL (ref 7.350–7.450)
pH, Arterial: 7.118 — CL (ref 7.350–7.450)
pO2, Arterial: 60 mmHg — ABNORMAL LOW (ref 83.0–108.0)
pO2, Arterial: 68 mmHg — ABNORMAL LOW (ref 83.0–108.0)
pO2, Arterial: 67 mmHg — ABNORMAL LOW (ref 83.0–108.0)

## 2020-11-19 LAB — PHOSPHORUS: Phosphorus: 3.9 mg/dL (ref 2.5–4.6)

## 2020-11-19 LAB — GLUCOSE, CAPILLARY
Glucose-Capillary: 94 mg/dL (ref 70–99)
Glucose-Capillary: 114 mg/dL — ABNORMAL HIGH (ref 70–99)
Glucose-Capillary: 73 mg/dL (ref 70–99)
Glucose-Capillary: 77 mg/dL (ref 70–99)
Glucose-Capillary: 127 mg/dL — ABNORMAL HIGH (ref 70–99)
Glucose-Capillary: 48 mg/dL — ABNORMAL LOW (ref 70–99)
Glucose-Capillary: 119 mg/dL — ABNORMAL HIGH (ref 70–99)

## 2020-11-19 LAB — AMMONIA: Ammonia: 26 umol/L (ref 9–35)

## 2020-11-19 LAB — COMPREHENSIVE METABOLIC PANEL
ALT: 45 U/L — ABNORMAL HIGH (ref 0–44)
AST: 181 U/L — ABNORMAL HIGH (ref 15–41)
Albumin: 1.7 g/dL — ABNORMAL LOW (ref 3.5–5.0)
Alkaline Phosphatase: 94 U/L (ref 38–126)
Anion gap: 17 — ABNORMAL HIGH (ref 5–15)
BUN: 12 mg/dL (ref 8–23)
CO2: 22 mmol/L (ref 22–32)
Calcium: 8.2 mg/dL — ABNORMAL LOW (ref 8.9–10.3)
Chloride: 96 mmol/L — ABNORMAL LOW (ref 98–111)
Creatinine, Ser: 1.85 mg/dL — ABNORMAL HIGH (ref 0.44–1.00)
GFR, Estimated: 30 mL/min — ABNORMAL LOW (ref >60)
Glucose, Bld: 109 mg/dL — ABNORMAL HIGH (ref 70–99)
Potassium: 4.4 mmol/L (ref 3.5–5.1)
Sodium: 135 mmol/L (ref 135–145)
Total Bilirubin: 1.6 mg/dL — ABNORMAL HIGH (ref 0.3–1.2)
Total Protein: 5.4 g/dL — ABNORMAL LOW (ref 6.5–8.1)

## 2020-11-19 LAB — LACTIC ACID, PLASMA: Lactic Acid, Venous: 7.7 mmol/L (ref 0.5–1.9)

## 2020-11-19 LAB — HEPARIN LEVEL (UNFRACTIONATED): Heparin Unfractionated: 0.29 IU/mL — ABNORMAL LOW (ref 0.30–0.70)

## 2020-11-19 LAB — MAGNESIUM: Magnesium: 2.9 mg/dL — ABNORMAL HIGH (ref 1.7–2.4)

## 2020-11-19 LAB — PROCALCITONIN: Procalcitonin: 3.52 ng/mL

## 2020-11-19 MED ORDER — IOHEXOL 350 MG/ML SOLN
75.0000 mL | Freq: Once | INTRAVENOUS | Status: AC | PRN
  Administered 2020-11-19: 75 mL via INTRAVENOUS

## 2020-11-19 MED ORDER — VANCOMYCIN HCL 750 MG/150ML IV SOLN: 750.0000 mg | INTRAVENOUS | Status: DC

## 2020-11-19 MED ORDER — EPINEPHRINE 1 MG/10ML IJ SOSY
PREFILLED_SYRINGE | INTRAMUSCULAR | Status: AC
  Filled 2020-11-19: qty 10

## 2020-11-19 MED ORDER — SODIUM BICARBONATE 8.4 % IV SOLN
100.0000 meq | Freq: Once | INTRAVENOUS | Status: AC
  Administered 2020-11-19: 100 meq via INTRAVENOUS
  Filled 2020-11-19: qty 100

## 2020-11-19 MED ORDER — FENTANYL CITRATE (PF) 100 MCG/2ML IJ SOLN
25.0000 ug | INTRAMUSCULAR | Status: DC | PRN
  Filled 2020-11-19: qty 2

## 2020-11-19 MED ORDER — SUCCINYLCHOLINE CHLORIDE 200 MG/10ML IV SOSY
PREFILLED_SYRINGE | INTRAVENOUS | Status: AC
  Filled 2020-11-19: qty 10

## 2020-11-19 MED ORDER — PROSOURCE TF PO LIQD: 45.0000 mL | Freq: Every day | ORAL | Status: DC

## 2020-11-19 MED ORDER — FENTANYL CITRATE (PF) 100 MCG/2ML IJ SOLN
INTRAMUSCULAR | Status: AC
  Administered 2020-11-19: 100 ug
  Filled 2020-11-19: qty 2

## 2020-11-19 MED ORDER — POLYETHYLENE GLYCOL 3350 17 G PO PACK
17.0000 g | PACK | Freq: Every day | ORAL | Status: DC
  Administered 2020-11-19: 17 g
  Filled 2020-11-19: qty 1

## 2020-11-19 MED ORDER — ORAL CARE MOUTH RINSE
15.0000 mL | OROMUCOSAL | Status: DC
  Administered 2020-11-19 (×5): 15 mL via OROMUCOSAL

## 2020-11-19 MED ORDER — ONDANSETRON HCL 4 MG PO TABS: 4.0000 mg | ORAL_TABLET | Freq: Four times a day (QID) | ORAL | Status: DC | PRN

## 2020-11-19 MED ORDER — ETOMIDATE 2 MG/ML IV SOLN
INTRAVENOUS | Status: AC
  Administered 2020-11-19: 20 mg
  Filled 2020-11-19: qty 20

## 2020-11-19 MED ORDER — DEXTROSE 50 % IV SOLN
25.0000 g | INTRAVENOUS | Status: AC
  Administered 2020-11-19: 25 g via INTRAVENOUS

## 2020-11-19 MED ORDER — HEPARIN (PORCINE) 25000 UT/250ML-% IV SOLN
1200.0000 [IU]/h | INTRAVENOUS | Status: DC
  Administered 2020-11-19: 1200 [IU]/h via INTRAVENOUS

## 2020-11-19 MED ORDER — CEFAZOLIN SODIUM-DEXTROSE 2-4 GM/100ML-% IV SOLN
2.0000 g | Freq: Two times a day (BID) | INTRAVENOUS | Status: DC
  Administered 2020-11-19: 2 g via INTRAVENOUS
  Filled 2020-11-19: qty 100

## 2020-11-19 MED ORDER — DOCUSATE SODIUM 50 MG/5ML PO LIQD
100.0000 mg | Freq: Two times a day (BID) | ORAL | Status: DC
  Administered 2020-11-19: 100 mg
  Filled 2020-11-19: qty 10

## 2020-11-19 MED ORDER — DEXTROSE 10 % IV SOLN: INTRAVENOUS | Status: DC

## 2020-11-19 MED ORDER — VANCOMYCIN HCL 1250 MG/250ML IV SOLN
1250.0000 mg | Freq: Once | INTRAVENOUS | Status: AC
  Administered 2020-11-19: 1250 mg via INTRAVENOUS
  Filled 2020-11-19: qty 250

## 2020-11-19 MED ORDER — NOREPINEPHRINE 16 MG/250ML-% IV SOLN
0.0000 ug/min | INTRAVENOUS | Status: DC
Start: 2020-11-19 — End: 2020-11-20
  Administered 2020-11-19: 20 ug/min via INTRAVENOUS
  Administered 2020-11-19: 40 ug/min via INTRAVENOUS
  Filled 2020-11-19 (×3): qty 250

## 2020-11-19 MED ORDER — PHENYLEPHRINE HCL-NACL 20-0.9 MG/250ML-% IV SOLN
0.0000 ug/min | INTRAVENOUS | Status: DC
  Administered 2020-11-19: 20 ug/min via INTRAVENOUS
  Filled 2020-11-19: qty 250

## 2020-11-19 MED ORDER — ONDANSETRON HCL 4 MG/2ML IJ SOLN: 4.0000 mg | Freq: Four times a day (QID) | INTRAMUSCULAR | Status: DC | PRN

## 2020-11-19 MED ORDER — VASOPRESSIN 20 UNITS/100 ML INFUSION FOR SHOCK
0.0000 [IU]/min | INTRAVENOUS | Status: DC
  Administered 2020-11-19 (×2): 0.04 [IU]/min via INTRAVENOUS
  Filled 2020-11-19 (×2): qty 100

## 2020-11-19 MED ORDER — B COMPLEX-C PO TABS: 1.0000 | ORAL_TABLET | Freq: Every day | ORAL | Status: DC

## 2020-11-19 MED ORDER — KETAMINE HCL 50 MG/5ML IJ SOSY
PREFILLED_SYRINGE | INTRAMUSCULAR | Status: AC
  Filled 2020-11-19: qty 5

## 2020-11-19 MED ORDER — MIDAZOLAM HCL 2 MG/2ML IJ SOLN: 1.0000 mg | INTRAMUSCULAR | Status: DC | PRN

## 2020-11-19 MED ORDER — MIDAZOLAM HCL 2 MG/2ML IJ SOLN
INTRAMUSCULAR | Status: AC
  Administered 2020-11-19: 1 mg
  Filled 2020-11-19: qty 2

## 2020-11-19 MED ORDER — ROCURONIUM BROMIDE 10 MG/ML (PF) SYRINGE
PREFILLED_SYRINGE | INTRAVENOUS | Status: AC
  Filled 2020-11-19: qty 10

## 2020-11-19 MED ORDER — VITAL 1.5 CAL PO LIQD: 1000.0000 mL | ORAL | Status: DC

## 2020-11-19 MED ORDER — SODIUM CHLORIDE 0.9 % IV SOLN
1.0000 g | Freq: Three times a day (TID) | INTRAVENOUS | Status: DC
  Administered 2020-11-19: 1 g via INTRAVENOUS
  Filled 2020-11-19 (×3): qty 1

## 2020-11-19 MED ORDER — VITAL 1.5 CAL PO LIQD
1000.0000 mL | ORAL | Status: DC
  Administered 2020-11-19: 1000 mL

## 2020-11-19 MED ORDER — SODIUM BICARBONATE 8.4 % IV SOLN
INTRAVENOUS | Status: AC
  Filled 2020-11-19: qty 50

## 2020-11-19 MED ORDER — DEXTROSE 50 % IV SOLN
INTRAVENOUS | Status: AC
  Filled 2020-11-19: qty 50

## 2020-11-19 MED ORDER — CHLORHEXIDINE GLUCONATE 0.12% ORAL RINSE (MEDLINE KIT)
15.0000 mL | Freq: Two times a day (BID) | OROMUCOSAL | Status: DC
  Administered 2020-11-19 (×2): 15 mL via OROMUCOSAL

## 2020-11-19 MED ORDER — HYDROCORTISONE SOD SUC (PF) 100 MG IJ SOLR
100.0000 mg | Freq: Two times a day (BID) | INTRAMUSCULAR | Status: DC
  Administered 2020-11-19: 100 mg via INTRAVENOUS
  Filled 2020-11-19: qty 2

## 2020-12-03 DIAGNOSIS — Z992 Dependence on renal dialysis: Secondary | ICD-10-CM | POA: Diagnosis not present

## 2020-12-03 DIAGNOSIS — N186 End stage renal disease: Secondary | ICD-10-CM | POA: Diagnosis not present

## 2020-12-04 NOTE — Progress Notes (Signed)
Spoke to family concerning declining blood pressure despite adding a third pressor and increased pressor support. Expressed concern that despite our best efforts and interventions pt continues to decline. Family expressed understanding and tearful.   We discussed her worsening shock and if her husband and family would want [place] to do chest compressions if Kemper's heart stopped. Pts husband and family stated they would not want to staff to perform chest compressions but currently want to continue all medications, vent, CRRT, etc. Yolande Jolly, RN also present for conversation.  [place] called and made aware

## 2020-12-04 NOTE — Code Documentation (Signed)
Stroke Response Nurse Documentation Code Documentation  Code stroke cancelled by bedside MD by the time Stroke Response RN to bedside.   Candace Cruise K  Stroke Response RN

## 2020-12-04 NOTE — Progress Notes (Signed)
Pupillary change noted along with decreased in mental status, patient not responding to pain. Rapid response RN at bedside to assist with transporting patient to STAT CT head. Heparin gtt stopped per Mickel Baas Gleason, PA pending CT results.

## 2020-12-04 NOTE — Progress Notes (Signed)
SLP Cancellation Note  Patient Details Name: [name] MRN: [id number] DOB: 1955/03/09   Cancelled treatment:       Reason Eval/Treat Not Completed: Medical issues which prohibited therapy. Pt reintubated early this AM. SLP to f/u for re eval of swallow function when medically ready.    Ellwood Dense, [place], [employer] Acute [facility] Services Office Number: [phone]  Acie Fredrickson 12/16/2020, 9:35 AM

## 2020-12-04 NOTE — Procedures (Addendum)
Central Venous Catheter Insertion Procedure Note  PHAITH BOBEK  [id number]  12-24-1955  Date:12/18/20  Time:11:17 AM   Provider Performing:Karrington Studnicka Charleen Kirks   Procedure: Insertion of Non-tunneled Central Venous [phone]) with US guidance [id number])   Indication(s) Medication administration  Consent Risks of the procedure as well as the alternatives and risks of each were explained to the patient and/or caregiver.  Consent for the procedure was obtained and is signed in the bedside chart  Anesthesia Topical only with 1% lidocaine   Timeout Verified patient identification, verified procedure, site/side was marked, verified correct patient position, special equipment/implants available, medications/allergies/relevant history reviewed, required imaging and test results available.  Sterile Technique Maximal sterile technique including full sterile barrier drape, hand hygiene, sterile gown, sterile gloves, mask, hair covering, sterile ultrasound probe cover (if used).  Procedure Description Area of catheter insertion was cleaned with chlorhexidine and draped in sterile fashion.  With real-time ultrasound guidance a central venous catheter was placed into the left internal jugular vein. Nonpulsatile blood flow and easy flushing noted in all ports.  The catheter was sutured in place and sterile dressing applied.  Complications/Tolerance None; patient tolerated the procedure well. Chest X-ray is ordered to verify placement for internal jugular or subclavian cannulation.   Chest x-ray is not ordered for femoral cannulation.  EBL Minimal  Specimen(s) None

## 2020-12-04 NOTE — Progress Notes (Signed)
eLink Physician-Brief Progress Note Patient Name: [name] DOB: November 21, 1955 MRN: [id number]   Date of Service  12/08/20  HPI/Events of Note  NGT remains on low intermittent suction with no further output; stomach is essentially empty.  Resp status has not improved; still with shallow, almost agonal, breathing.  Somnolent.  eICU Interventions  Trial of BiPAP.     Intervention Category Major Interventions: Respiratory failure - evaluation and management  Tilden Dome 12-08-2020, 3:48 AM

## 2020-12-04 NOTE — Progress Notes (Signed)
eLink Physician-Brief Progress Note Patient Name: [name] DOB: 1956-02-27 MRN: [id number]   Date of Service  12-10-20  HPI/Events of Note  MAP is dropping  eICU Interventions  Added NEO as 3rd pressor.       Intervention Category Major Interventions: Hypotension - evaluation and management  Tilden Dome 12-10-2020, 8:52 PM

## 2020-12-04 NOTE — Procedures (Signed)
Arterial Catheter Insertion Procedure Note  [name]  [id number]  [birth year]  Date:December 11, 2020  Time:5:26 PM    Provider Performing: Mick Sell    Procedure: Insertion of Arterial Line [phone]) with US guidance [id number])   Indication(s) Blood pressure monitoring and/or need for frequent ABGs  Consent Unable to obtain consent due to emergent nature of procedure.  Anesthesia None   Time Out Verified patient identification, verified procedure, site/side was marked, verified correct patient position, special equipment/implants available, medications/allergies/relevant history reviewed, required imaging and test results available.   Sterile Technique Maximal sterile technique including full sterile barrier drape, hand hygiene, sterile gown, sterile gloves, mask, hair covering, sterile ultrasound probe cover (if used).   Procedure Description Area of catheter insertion was cleaned with chlorhexidine and draped in sterile fashion. With real-time ultrasound guidance an arterial catheter was placed into the right  axillary  artery.  Appropriate arterial tracings confirmed on monitor.     Complications/Tolerance None; patient tolerated the procedure well.   EBL minimal   Specimen(s) None  JD Rexene Agent [facility] 12/11/2020, 5:27 PM  Please see [email or web address] for pager details.  From 7A-7P if no response, please call [phone]. After hours, please call ELink [phone].

## 2020-12-04 NOTE — Progress Notes (Signed)
RR increased per MD order

## 2020-12-04 NOTE — Procedures (Signed)
Intubation Procedure Note  [name]  [id number]  [birth year]  Date:Dec 13, 2020  Time:5:23 AM   Provider Performing:Delshawn Stech V. Colton Tassin    Procedure: Intubation ([id number])  Indication(s) Respiratory Failure  Consent Unable to obtain consent due to emergent nature of procedure.   Anesthesia Etomidate, Versed, and Fentanyl   Time Out Verified patient identification, verified procedure, site/side was marked, verified correct patient position, special equipment/implants available, medications/allergies/relevant history reviewed, required imaging and test results available.   Sterile Technique Usual hand hygeine, masks, and gloves were used   Procedure Description Patient positioned in bed supine.  Sedation given as noted above.  Patient was intubated with endotracheal tube using Glidescope.  View was Grade 1 full glottis .  Number of attempts was 1.  Colorimetric CO2 detector was consistent with tracheal placement.   Complications/Tolerance None; patient tolerated the procedure well. Chest X-ray is ordered to verify placement.   EBL Minimal   Specimen(s) None   Sue Dona V. Elsworth Soho MD

## 2020-12-04 NOTE — Progress Notes (Signed)
PT Cancellation Note  Patient Details Name: [name] MRN: [id number] DOB: [birth year]   Cancelled Treatment:    Reason Eval/Treat Not Completed: Patient not medically ready. Pt reintubated this AM after extubation yesterday. Will continue to monitor for medical appropriateness.    Shary Decamp [facility] Dec 17, 2020, 9:34 AM [place] Pager [phone] Office [phone]

## 2020-12-04 NOTE — Progress Notes (Addendum)
Pt time of death 2245. Time of death verified with second RN. Will notify [place] and [place] physician.

## 2020-12-04 NOTE — Progress Notes (Signed)
PHARMACY - TOTAL PARENTERAL NUTRITION CONSULT NOTE   Indication: Prolonged ileus  Patient Measurements: Height: '5\' 5"'$  (165.1 cm) Weight: 69.1 kg (152 lb 5.4 oz) IBW/kg (Calculated) : 57 TPN AdjBW (KG): 67.9 Body mass index is 25.35 kg/m.  Assessment: 65 years of age female who transferred to [facility] from [facility] after mechanical fall and femur fracture with repair on 9/12.   Glucose / Insulin: CBG's low currently (increasing to stress dose steroids) Electrolytes: Na 135/Cl 96, K 4.4, Mag 2.9, Phos 3.9, iCa 1.11 Renal: ESRD (hx kidney transplant 2011-weaned off Tacrolimus 7/22, on steroids) Hepatic: AST up 181, ALT 45, Tbili up 1.6 Intake / Output; MIVF: NGT 1050 mL, LBM 9/15 GI Imaging: GI Surgeries / Procedures:  9/16 CT A/P >> pending  Central access: Triple lumen CVC placed 12/19/2020 TPN start date: n/a  Nutritional Goals: Goal Concentrated TPN rate is 75 mL/hr (provides 140 g of protein and 2170 kcals per day)  RD Assessment: Estimated Needs Total Energy Estimated Needs: 2000-2200 Total Protein Estimated Needs: 130-150 grams Total Fluid Estimated Needs: 2.0 L/day  Current Nutrition:  NPO and Tube feeding  Plan:  Hold TPN today per Dr. Tamala Julian and attempt post-pyloric feeds.  Plan trial of tube feeds and if no toleration, start TPN on Saturday 11/11/2020 per discussion with Dr. Tamala Julian.   Sloan Leiter, PharmD, BCPS, BCCCP Clinical Pharmacist Please refer to [facility] for [place] numbers 19-Dec-2020,2:42 PM

## 2020-12-04 NOTE — Progress Notes (Signed)
eLink Physician-Brief Progress Note Patient Name: [name] DOB: 02-13-1956 MRN: [id number]   Date of Service  11/29/2020  HPI/Events of Note  Pt remains unstable despite max care.  Spoke to husband and other family members in room  eICU Interventions  Family aware that pt is dying and they wish for her to be DNR.  RN to discuss w/drawal of care with family further.     Intervention Category Major Interventions: End of life / care limitation discussion  Tilden Dome November 29, 2020, 9:55 PM

## 2020-12-04 NOTE — Progress Notes (Signed)
Orthopaedic Trauma Progress Note  SUBJECTIVE: Patient remains intubated and sedated. Not following commands. No acute issues overnight from ortho standpoint.  OBJECTIVE:  Vitals:   2020-12-11 1300 12/11/2020 1315  BP: (!) 91/55 98/69  Pulse:    Resp: 18 19  Temp:    SpO2:      General: Intubated and sedated. Respiratory: Mechanically ventilated  LLE: Dressings removed, incisions CDI. Foot slightly cool but equal to contralateral side. Unable to obtain reliable motor or sensory exam.   IMAGING: Stable post op imaging.   LABS:  Results for orders placed or performed during the hospital encounter of 11/24/2020 (from the past 24 hour(s))  Glucose, capillary     Status: Abnormal   Collection Time: 11/18/20  4:13 PM  Result Value Ref Range   Glucose-Capillary 173 (H) 70 - 99 mg/dL  Heparin level (unfractionated)     Status: Abnormal   Collection Time: 11/18/20  6:10 PM  Result Value Ref Range   Heparin Unfractionated 0.12 (L) 0.30 - 0.70 IU/mL  Renal function panel     Status: Abnormal   Collection Time: 11/18/20  6:49 PM  Result Value Ref Range   Sodium 133 (L) 135 - 145 mmol/L   Potassium 4.1 3.5 - 5.1 mmol/L   Chloride 99 98 - 111 mmol/L   CO2 23 22 - 32 mmol/L   Glucose, Bld 150 (H) 70 - 99 mg/dL   BUN 14 8 - 23 mg/dL   Creatinine, Ser 2.06 (H) 0.44 - 1.00 mg/dL   Calcium 7.4 (L) 8.9 - 10.3 mg/dL   Phosphorus 2.9 2.5 - 4.6 mg/dL   Albumin 1.7 (L) 3.5 - 5.0 g/dL   GFR, Estimated 26 (L) >60 mL/min   Anion gap 11 5 - 15  Glucose, capillary     Status: Abnormal   Collection Time: 11/18/20  7:47 PM  Result Value Ref Range   Glucose-Capillary 151 (H) 70 - 99 mg/dL  I-STAT 7, (LYTES, BLD GAS, ICA, H+H)     Status: Abnormal   Collection Time: 11/18/20 11:06 PM  Result Value Ref Range   pH, Arterial 7.316 (L) 7.350 - 7.450   pCO2 arterial 44.3 32.0 - 48.0 mmHg   pO2, Arterial 52 (L) 83.0 - 108.0 mmHg   Bicarbonate 23.3 20.0 - 28.0 mmol/L   TCO2 25 22 - 32 mmol/L   O2  Saturation 88.0 %   Acid-base deficit 3.0 (H) 0.0 - 2.0 mmol/L   Sodium 133 (L) 135 - 145 mmol/L   Potassium 4.3 3.5 - 5.1 mmol/L   Calcium, Ion 1.10 (L) 1.15 - 1.40 mmol/L   HCT 26.0 (L) 36.0 - 46.0 %   Hemoglobin 8.8 (L) 12.0 - 15.0 g/dL   Patient temperature 94.4 F    Collection site Radial    Drawn by RT    Sample type ARTERIAL   Glucose, capillary     Status: Abnormal   Collection Time: 11/18/20 11:34 PM  Result Value Ref Range   Glucose-Capillary 155 (H) 70 - 99 mg/dL  Glucose, capillary     Status: None   Collection Time: 12/11/20  3:29 AM  Result Value Ref Range   Glucose-Capillary 94 70 - 99 mg/dL  I-STAT 7, (LYTES, BLD GAS, ICA, H+H)     Status: Abnormal   Collection Time: 12-11-2020  5:04 AM  Result Value Ref Range   pH, Arterial 7.315 (L) 7.350 - 7.450   pCO2 arterial 45.9 32.0 - 48.0 mmHg   pO2, Arterial 60 (  L) 83.0 - 108.0 mmHg   Bicarbonate 23.6 20.0 - 28.0 mmol/L   TCO2 25 22 - 32 mmol/L   O2 Saturation 90.0 %   Acid-base deficit 3.0 (H) 0.0 - 2.0 mmol/L   Sodium 135 135 - 145 mmol/L   Potassium 4.3 3.5 - 5.1 mmol/L   Calcium, Ion 1.11 (L) 1.15 - 1.40 mmol/L   HCT 28.0 (L) 36.0 - 46.0 %   Hemoglobin 9.5 (L) 12.0 - 15.0 g/dL   Patient temperature 97.0 F    Collection site Radial    Drawn by RT    Sample type ARTERIAL   Magnesium     Status: Abnormal   Collection Time: 12/03/20  5:43 AM  Result Value Ref Range   Magnesium 2.9 (H) 1.7 - 2.4 mg/dL  Comprehensive metabolic panel     Status: Abnormal   Collection Time: 12-03-20  5:43 AM  Result Value Ref Range   Sodium 135 135 - 145 mmol/L   Potassium 4.4 3.5 - 5.1 mmol/L   Chloride 96 (L) 98 - 111 mmol/L   CO2 22 22 - 32 mmol/L   Glucose, Bld 109 (H) 70 - 99 mg/dL   BUN 12 8 - 23 mg/dL   Creatinine, Ser 1.85 (H) 0.44 - 1.00 mg/dL   Calcium 8.2 (L) 8.9 - 10.3 mg/dL   Total Protein 5.4 (L) 6.5 - 8.1 g/dL   Albumin 1.7 (L) 3.5 - 5.0 g/dL   AST 181 (H) 15 - 41 U/L   ALT 45 (H) 0 - 44 U/L   Alkaline  Phosphatase 94 38 - 126 U/L   Total Bilirubin 1.6 (H) 0.3 - 1.2 mg/dL   GFR, Estimated 30 (L) >60 mL/min   Anion gap 17 (H) 5 - 15  Phosphorus     Status: None   Collection Time: 12/03/2020  5:43 AM  Result Value Ref Range   Phosphorus 3.9 2.5 - 4.6 mg/dL  Heparin level (unfractionated)     Status: Abnormal   Collection Time: 12/03/2020  5:43 AM  Result Value Ref Range   Heparin Unfractionated 0.29 (L) 0.30 - 0.70 IU/mL  CBC     Status: Abnormal   Collection Time: December 03, 2020  5:43 AM  Result Value Ref Range   WBC 39.8 (H) 4.0 - 10.5 K/uL   RBC 2.97 (L) 3.87 - 5.11 MIL/uL   Hemoglobin 8.6 (L) 12.0 - 15.0 g/dL   HCT 28.1 (L) 36.0 - 46.0 %   MCV 94.6 80.0 - 100.0 fL   MCH 29.0 26.0 - 34.0 pg   MCHC 30.6 30.0 - 36.0 g/dL   RDW 18.2 (H) 11.5 - 15.5 %   Platelets 132 (L) 150 - 400 K/uL   nRBC 0.2 0.0 - 0.2 %  Glucose, capillary     Status: Abnormal   Collection Time: 12-03-20  7:51 AM  Result Value Ref Range   Glucose-Capillary 114 (H) 70 - 99 mg/dL  Glucose, capillary     Status: None   Collection Time: Dec 03, 2020 11:40 AM  Result Value Ref Range   Glucose-Capillary 73 70 - 99 mg/dL  Ammonia     Status: None   Collection Time: 12/03/2020 12:08 PM  Result Value Ref Range   Ammonia 26 9 - 35 umol/L  Lactic acid, plasma     Status: Abnormal   Collection Time: 03-Dec-2020 12:08 PM  Result Value Ref Range   Lactic Acid, Venous 7.7 (HH) 0.5 - 1.9 mmol/L    ASSESSMENT: [name]  is a 65 y.o. female, 4 Days Post-Op s/p ORIF LEFT DISTAL FEMUR FRACTURE  CV/Blood loss: Hgb 8.6 this AM  PLAN: Weightbearing: TDWB LLE Incisional and dressing care: OK to leave open to air. Reinforce dressings as needed.  Showering: Bed bath Orthopedic device(s):  None Pain management: per primary team VTE prophylaxis: Heparin, SCDs ID: On Zosyn per primary team.   Foley/Lines:  No foley, KVO IVFs Impediments to Fracture Healing: Vit D level 9, started on D2 supplementation  Dispo: Intubated currently.  PT/OT once able.   Follow - up plan: Will continue to follow remotely and plan for outpatient follow upon d/c  Contact information:  Katha Hamming MD, Patrecia Pace PA-C. After hours and holidays please check [email or web address] for group call information for [facility]   Winston Sobczyk A. Ricci Barker, PA-C [phone] (office) [email or web address]

## 2020-12-04 NOTE — Progress Notes (Signed)
OT Cancellation Note  Patient Details Name: [name] MRN: [id number] DOB: 09/08/1955   Cancelled Treatment:    Reason Eval/Treat Not Completed: Patient not medically ready.  Pt reintubated this AM after extubation yesterday. Will continue to monitor for medical appropriateness.   [name] D Veronda Gabor 2020-12-18, 9:57 AM

## 2020-12-04 NOTE — Progress Notes (Signed)
ANTICOAGULATION CONSULT NOTE - Follow Up Consult  Pharmacy Consult for Heparin Indication: atrial fibrillation  Allergies  Allergen Reactions   Other Other (See Comments)    Tape Bruises and tears skin, Paper tape tolerated   Tape Other (See Comments)    Bruises and tears skin Paper tape tolerated Bruises and tears skin Paper tape tolerated Bruises and tears skin Paper tape tolerated   Renvela [Sevelamer] Nausea And Vomiting    Inability to eat or sleep per spouse. States that all side effects listed for this medication, the patient did experience    Patient Measurements: Height: '5\' 5"'$  (165.1 cm) Weight: 69.1 kg (152 lb 5.4 oz) IBW/kg (Calculated) : 57 Heparin Dosing Weight: 67.9  Vital Signs: BP: 124/46 (09/16 1630) Pulse Rate: 100 (09/16 1630)  Labs: Recent Labs    11/17/20 1544 11/17/20 2306 11/18/20 0104 11/18/20 0840 11/18/20 1810 11/18/20 1849 11/18/20 2306 19-Dec-2020 0504 12-19-20 0543  HGB 8.3*  --   --  8.8*  --   --  8.8* 9.5* 8.6*  HCT 25.7*  --   --  27.5*  --   --  26.0* 28.0* 28.1*  PLT 133*  --   --  142*  --   --   --   --  132*  HEPARINUNFRC  --   --   --   --  0.12*  --   --   --  0.29*  CREATININE 7.33*  --  5.18*  --   --  2.06*  --   --  1.85*  TROPONINIHS  --  81* 77*  --   --   --   --   --   --     Estimated Creatinine Clearance: 29.2 mL/min (A) (by C-G formula based on SCr of 1.85 mg/dL (H)).   Medications:  Infusions:    prismasol BGK 4/2.5 300 mL/hr at 12-19-20 1359   sodium chloride 10 mL/hr at December 19, 2020 1600   feeding supplement (VITAL 1.5 CAL) Stopped (2020/12/19 1330)   heparin     meropenem (MERREM) IV 1 g (12-19-20 1653)   norepinephrine (LEVOPHED) Adult infusion 35 mcg/min (19-Dec-2020 1600)   prismasol BGK 2/2.5 replacement solution 500 mL/hr at 12/19/20 0718   prismasol BGK 4/2.5 1,500 mL/hr at 12/19/2020 1402   vancomycin     [START ON 11/06/2020] vancomycin     vasopressin 0.04 Units/min (Dec 19, 2020 1600)    Assessment: 65  yo F with Afib, admitted for L distal femur fracture after fall.  Patient previously on apixaban 2.'5mg'$  BID PTA, held for surgery. Open reduction internal fixation 9/11  9/16 Concern for bleed from abdomen and head.  Heparin gtt held until after imaging CT head and abdomen showed no signs of bleed.  Previously on 1200 units/hr  Goal of Therapy:  Heparin level 0.3-0.7 units/ml Monitor platelets by anticoagulation protocol: Yes   Plan:  Start heparin infusion at 1200 units/hr Check anti-Xa level in 6 hours and daily while on heparin Continue to monitor H&H and platelets  Donnald Garre, PharmD Clinical Pharmacist  Please check AMION for all [place] numbers After 10:00 PM, call [place]

## 2020-12-04 NOTE — Death Summary Note (Signed)
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  Ortho is planning for OR intervention on 9/12 at [facility]   Attempted hemodialysis on 2022/12/11 but aborted due to clotting of AV fistula, unable to return 300 cc of blood. She then had a femoral catheter placed and dialyzed today 9/10 with 1 unit of blood due to blood loss anemia.  During dialysis she developed acute respiratory failure, possible aspiration and emergently intubated.     PCCM consulted for transfer to [facility] for further management  Hospital course complicated by recurrent aspiration, persistent respiratory failure, intermittent metabolic encephalopathy related to uremia, persistent septic shock.  She unfortunately continued to deteriorate and passed from worsening shock on the evening of December 17, 2020.  Pertinent Labs and Studies  Significant Diagnostic Studies DG Abd 1 View  Result Date: 11/18/2020 CLINICAL DATA:  Ileus, nausea/vomiting EXAM: ABDOMEN - 1 VIEW COMPARISON:  11/13/2020 FINDINGS: There is moderate gaseous distention of the stomach. The nasogastric tube is no longer seen although the upper abdomen is excluded.  Multiple gas distended small bowel loops are noted in the mid abdomen. The colon is nondistended. Extensive aortoiliac arterial calcifications. Right femoral hemodialysis catheter tip in the region of the right external/common iliac vein junction. Regional bones unremarkable. IMPRESSION: 1. Multiple distended small bowel loops suggesting adynamic ileus vs early/partial distal SBO. Electronically Signed   By: Lucrezia Europe M.D.   On: 11/18/2020 15:06   DG Abd 1 View  Result Date: 11/13/2020 CLINICAL DATA:  Orogastric tube placement. EXAM: ABDOMEN - 1 VIEW COMPARISON:  Abdomen and pelvis CT dated 08/27/2020 FINDINGS: Orogastric 2 tip in the mid to distal stomach and side hole in the proximal to mid stomach. Normal bowel gas pattern. Cholecystectomy clips. Dense atheromatous arterial calcifications. Mild dextroconvex scoliosis. IMPRESSION: Orogastric tube tip and side hole in the stomach. Electronically Signed   By: Claudie Revering M.D.   On: 11/13/2020 21:30   CT HEAD WO CONTRAST (5MM)  Result Date: 12/17/20 CLINICAL DATA:  Delirium. EXAM: CT HEAD WITHOUT CONTRAST TECHNIQUE: Contiguous axial images were obtained from the base of the skull through the vertex without intravenous contrast. COMPARISON:  CT head 11/26/2018 FINDINGS: Brain: No evidence of large-territorial acute infarction. No parenchymal hemorrhage. No mass lesion. No extra-axial collection. No mass effect or midline shift. No hydrocephalus. Basilar cisterns are patent. Vascular: No hyperdense vessel. Atherosclerotic calcifications are present within the cavernous internal carotid and vertebral arteries. Skull: No acute fracture or focal lesion. Sinuses/Orbits: Paranasal sinuses and mastoid air cells are clear. The orbits are unremarkable. Other: Endotracheal and nasogastric tubes partially visualized. IMPRESSION: No acute intracranial abnormality. Electronically Signed   By: Iven Finn M.D.   On: 12/17/2020 16:16   CT Knee Left Wo  Contrast  Result Date: Dec 10, 2020 CLINICAL DATA:  Knee instability EXAM:  CT OF THE left KNEE WITHOUT CONTRAST TECHNIQUE: Multidetector CT imaging of the left knee was performed according to the standard protocol. Multiplanar CT image reconstructions were also generated. COMPARISON:  Radiograph same day FINDINGS: Bones/Joint/Cartilage There is a comminuted distal femur fracture with impaction and intra-articular extension through the intercondylar notch. There is mild posteromedial displacement by 4-6 mm. There is no evidence of proximal tibia, proximal fibular, or patellar fracture. There is a moderate-sized lipohemarthrosis. Ligaments Suboptimally assessed by CT. Muscles and Tendons Generalized loss of muscle bulk with mild muscle atrophy. No intramuscular collection. Soft tissues Extensive soft tissue swelling along the distal femur and knee. Vascular calcifications. IMPRESSION: Comminuted, intra-articular distal femur fracture with impaction and mild posteromedial displacement. Moderate-sized lipohemarthrosis. Electronically Signed   By: Maurine Simmering M.D.   On: 11/25/2020 13:31   CT ABDOMEN PELVIS W CONTRAST  Result Date: Nov 29, 2020 CLINICAL DATA:  Bowel obstruction suspected. EXAM: CT ABDOMEN AND PELVIS WITH CONTRAST TECHNIQUE: Multidetector CT imaging of the abdomen and pelvis was performed using the standard protocol following bolus administration of intravenous contrast. CONTRAST:  11m OMNIPAQUE IOHEXOL 350 MG/ML SOLN COMPARISON:  X-ray abdomen 11/18/2020 FINDINGS: Lines and tubes: Enteric tube with tip and side port terminating within the gastric lumen. Dobbhoff tube noted with weighted tube terminating within the fourth portion of the duodenum. Partially visualized central venous catheter terminating at the superior cavoatrial junction. Rectal tube terminates within rectal. Lower chest: Bilateral lower lobe heterogeneous consolidations, left greater than right. Coronary artery calcifications.  Hepatobiliary: No focal liver abnormality. Status post cholecystectomy. No biliary dilatation. Pancreas: No focal lesion. Normal pancreatic contour. No surrounding inflammatory changes. No main pancreatic ductal dilatation. Spleen: Normal in size without focal abnormality. Adrenals/Urinary Tract: No adrenal nodule bilaterally. Bilateral atrophic scarred native kidneys. Nonspecific bilateral perinephric stranding of the native kidneys. Bilateral kidneys enhance symmetrically. No excretion on delayed view of the native kidneys. No hydronephrosis or hydroureter of the native kidneys. Right iliac fossa transplant kidney demonstrating urothelial thickening and severe hydronephrosis. The urinary bladder is unremarkable. Stomach/Bowel: Stomach is within normal limits. Diffusely distended small bowel with fluid and gas. Fluid noted within large bowel. No transition point. No evidence of bowel wall thickening or dilatation. The appendix not definitely identified. Vascular/Lymphatic: No abdominal aorta or iliac aneurysm. Severe atherosclerotic plaque of the aorta and its branches. No abdominal, pelvic, or inguinal lymphadenopathy. Reproductive: Uterus and bilateral adnexa are unremarkable. Other: Trace simple free fluid. No intraperitoneal free gas. No organized fluid collection. free gas. Musculoskeletal: No abdominal wall hernia or abnormality. Diffusely decreased bone density. No suspicious lytic or blastic osseous lesions. No acute displaced fracture. Compression fracture of the T11 vertebral body is similar compared to prior. Multilevel degenerative changes of the spine. IMPRESSION: 1. Findings suggestive of an ileus and a diarrheal state. No definite findings to suggest bowel obstruction. 2. Bilateral lower lobe infection/inflammation. 3. Severe hydronephrosis of the right iliac fossa transplant kidney with suggestion of urothelial thickening concerning for infection. Correlate with urinalysis. 4.  Aortic  Atherosclerosis (ICD10-I70.0). Electronically Signed   By: MIven FinnM.D.   On: [id number]   CT 3D RECON AT SCANNER  Result Date: 11/06/2020 CLINICAL DATA:  Knee instability EXAM: 3-DIMENSIONAL CT IMAGE RENDERING ON ACQUISITION WORKSTATION TECHNIQUE: 3-dimensional CT images were rendered by post-processing of the original CT data on an acquisition workstation. The 3-dimensional CT images were interpreted and findings were reported in the accompanying complete CT report for this study COMPARISON:  None. FINDINGS: 3D reconstructions of the left  knee or performed. Again seen is a comminuted distal femur fracture with intra-articular extension through the intercondylar notch. There is mild posteromedial displacement and mild impaction. IMPRESSION: 3D reconstruction of the left knee demonstrating the comminuted distal femur fracture with intra-articular extension through the intercondylar notch Electronically Signed   By: Maurine Simmering M.D.   On: 11/10/2020 14:06   DG CHEST PORT 1 VIEW  Result Date: 12/01/20 CLINICAL DATA:  Central line placement feeding tube placement EXAM: PORTABLE CHEST 1 VIEW COMPARISON:  Chest radiograph 2020/12/01 FINDINGS: The endotracheal tube is in the lower thoracic trachea approximately 1.5 cm from the carina. There is a left IJ central venous catheter terminating in the right atrium. An enteric catheter is in place terminating in the third portion of the duodenum. The cardiomediastinal silhouette is stable. Linear opacities in the left base likely reflect subsegmental atelectasis. There is no new focal airspace disease. There is no significant pleural effusion. There is no pneumothorax. There is no acute osseous abnormality. IMPRESSION: 1. Support devices as above. The new left IJ central venous catheter terminates in the right atrium and the enteric catheter tip is in the third portion of the duodenum. 2. No significant interval change in lung aeration. Electronically  Signed   By: Valetta Mole M.D.   On: Dec 01, 2020 12:00   DG CHEST PORT 1 VIEW  Result Date: 12-01-20 CLINICAL DATA:  Ventilator dependence. EXAM: PORTABLE CHEST 1 VIEW COMPARISON:  11/14/2020 FINDINGS: 0441 hours. Endotracheal tube tip is 1.7 cm above the base of the carina. NG tube tip overlies the gastric fundus. Persistent retrocardiac streaky opacity likely atelectasis. No pulmonary edema or substantial pleural effusion. Stable heart size. Bones are diffusely demineralized. Telemetry leads overlie the chest. IMPRESSION: 1. Endotracheal tube tip is 1.7 cm above the base of the carina. 2. Persistent retrocardiac streaky opacity, likely atelectasis. Electronically Signed   By: Misty Stanley M.D.   On: 01-Dec-2020 05:03   DG Chest Port 1 View  Result Date: 11/14/2020 CLINICAL DATA:  Respiratory failure with hypoxia EXAM: PORTABLE CHEST 1 VIEW COMPARISON:  Yesterday FINDINGS: Endotracheal tube terminates 5.3 cm above carina. Nasogastric tube extends beyond the inferior aspect of the film. Cholecystectomy clips. Midline trachea. Moderate cardiomegaly. Atherosclerosis in the transverse aorta. No pleural effusion or pneumothorax. Moderate diffuse interstitial thickening. No well-defined lobar consolidation. IMPRESSION: Similar appearance of cardiomegaly and interstitial thickening, likely related to clinical history of heart failure. Aortic Atherosclerosis (ICD10-I70.0). Electronically Signed   By: Abigail Miyamoto M.D.   On: 11/14/2020 08:46   DG Chest Port 1 View  Result Date: 11/13/2020 CLINICAL DATA:  Evaluate for endotracheal tube placement. EXAM: PORTABLE CHEST 1 VIEW COMPARISON:  March 29, 2020 FINDINGS: Endotracheal tube is identified with distal tip 4.8 cm from carina. There is no pneumothorax. The lungs are clear. The mediastinal contour and cardiac silhouette are normal. The osseous structures are stable. IMPRESSION: Endotracheal tube identified in good position.  No pneumothorax. Electronically  Signed   By: Abelardo Diesel M.D.   On: 11/13/2020 13:19   DG Knee Complete 4 Views Left  Result Date: 11/06/2020 CLINICAL DATA:  [place] yesterday. Pain and swelling. EXAM: LEFT KNEE - COMPLETE 4+ VIEW COMPARISON:  None. FINDINGS: Comminuted but minimally displaced fracture involving the distal femur. Intra-articular involvement with large hemarthrosis. The tibia and fibula are intact. Extensive age advanced vascular calcifications. IMPRESSION: Comminuted distal femur fracture with intra-articular involvement. Electronically Signed   By: Marijo Sanes M.D.   On: 11/27/2020 09:57   DG  Knee Left Port  Result Date: 12/02/2020 CLINICAL DATA:  Status post left knee surgery. EXAM: PORTABLE LEFT KNEE - 1-2 VIEW COMPARISON:  Left femur x-ray 11/25/2020. FINDINGS: There is a new lateral sideplate with multiple screws screws fixating a distal femoral fracture. Alignment is anatomic. With recent surgery. Vascular calcifications are noted in the soft tissues. There is soft tissue swelling and air in the soft tissues compatible IMPRESSION: 1. ORIF distal femoral fracture in anatomic alignment. Electronically Signed   By: Ronney Asters M.D.   On: 11/26/2020 18:44   DG Abd Portable 1V  Result Date: 11/18/2020 CLINICAL DATA:  NG tube placement EXAM: PORTABLE ABDOMEN - 1 VIEW COMPARISON:  11/18/2020 FINDINGS: Airspace disease at left base. Esophageal tube tip and side port overlie the body of the stomach. Dilated loops of small suggesting bowel obstruction IMPRESSION: Esophageal tube tip and side port overlie the body of stomach Electronically Signed   By: Donavan Foil M.D.   On: 11/18/2020 23:23   DG C-Arm 1-60 Min-No Report  Result Date: 11/24/2020 Fluoroscopy was utilized by the requesting physician.  No radiographic interpretation.   DG FEMUR MIN 2 VIEWS LEFT  Result Date: 11/26/2020 CLINICAL DATA:  Left femoral ORIF, intraoperative examination EXAM: LEFT FEMUR 2 VIEWS COMPARISON:  11/11/2020 FINDINGS: Six  fluoroscopic intraoperative radiographs of the left distal femur demonstrates open reduction and internal fixation of a comminuted distal femoral fracture utilizing a lateral plate and multiple cortical screws fracture fragments appear in grossly anatomic alignment on final images. No dislocation. Advanced vascular calcifications noted. Fluoroscopy time: 50 seconds Dose: 1.48 mGy Images: 6 IMPRESSION: Left distal femoral ORIF as described above Electronically Signed   By: Fidela Salisbury M.D.   On: 11/14/2020 16:12    Microbiology Recent Results (from the past 240 hour(s))  MRSA Next Gen by PCR, Nasal     Status: None   Collection Time: 11/14/20  2:35 AM   Specimen: Nasal Mucosa; Nasal Swab  Result Value Ref Range Status   MRSA by PCR Next Gen NOT DETECTED NOT DETECTED Final    Comment: (NOTE) The GeneXpert MRSA Assay (FDA approved for NASAL specimens only), is one component of a comprehensive MRSA colonization surveillance program. It is not intended to diagnose MRSA infection nor to guide or monitor treatment for MRSA infections. Test performance is not FDA approved in patients less than 5 years old. Performed at [facility], [place] [address], [place], [place] [address]   Culture, Respiratory w Gram Stain     Status: None   Collection Time: 11/14/20  8:17 AM   Specimen: Tracheal Aspirate; Respiratory  Result Value Ref Range Status   Specimen Description TRACHEAL ASPIRATE  Final   Special Requests NONE  Final   Gram Stain   Final    ABUNDANT WBC PRESENT, PREDOMINANTLY PMN NO ORGANISMS SEEN Performed at [facility], [address] [place], [place] [address]    Culture   Final    RARE STAPHYLOCOCCUS AUREUS RARE CANDIDA DUBLINIENSIS    Report Status 11/18/2020 FINAL  Final   Organism ID, Bacteria STAPHYLOCOCCUS AUREUS  Final      Susceptibility   Staphylococcus aureus - MIC*    CIPROFLOXACIN <=0.5 SENSITIVE Sensitive     ERYTHROMYCIN <=0.25 SENSITIVE Sensitive      GENTAMICIN <=0.5 SENSITIVE Sensitive     OXACILLIN <=0.25 SENSITIVE Sensitive     TETRACYCLINE <=1 SENSITIVE Sensitive     VANCOMYCIN 1 SENSITIVE Sensitive     TRIMETH/SULFA <=  10 SENSITIVE Sensitive     CLINDAMYCIN <=0.25 SENSITIVE Sensitive     RIFAMPIN <=0.5 SENSITIVE Sensitive     Inducible Clindamycin NEGATIVE Sensitive     * RARE STAPHYLOCOCCUS AUREUS    Lab Basic Metabolic Panel: Recent Labs  Lab 11/16/20 2340 11/17/20 0245 11/17/20 0804 11/17/20 1544 11/18/20 0104 11/18/20 1849 11/18/20 2306 12-08-2020 0504 December 08, 2020 0543 12-08-2020 1759 2020/12/08 2032 2020-12-08 2124  NA  --   --  125* 127* 130* 133*   < > 135 135 132* 134* 133*  K  --   --  4.3 4.2 4.6 4.1   < > 4.3 4.4 5.4* 5.8* 6.2*  CL  --   --  88* 90* 96* 99  --   --  96*  --  101  --   CO2  --   --  18* 17* 21* 23  --   --  22  --  14*  --   GLUCOSE  --   --  204* 214* 220* 150*  --   --  109*  --  155*  --   BUN  --   --  58* 52* 36* 14  --   --  12  --  10  --   CREATININE  --   --  8.93* 7.33* 5.18* 2.06*  --   --  1.85*  --  1.53*  --   CALCIUM  --   --  7.0* 6.6* 7.2* 7.4*  --   --  8.2*  --  7.4*  --   MG 2.2 2.1 2.0  --  2.3  --   --   --  2.9*  --   --   --   PHOS 10.2* 9.8*  --  9.2* 6.3* 2.9  --   --  3.9  --   --   --    < > = values in this interval not displayed.   Liver Function Tests: Recent Labs  Lab 11/17/20 0804 11/17/20 1544 11/18/20 0104 11/18/20 1849 12/08/20 0543  AST 21  --   --   --  181*  ALT 8  --   --   --  45*  ALKPHOS 60  --   --   --  94  BILITOT 1.2  --   --   --  1.6*  PROT 5.1*  --   --   --  5.4*  ALBUMIN 1.6* 1.7* 1.7* 1.7* 1.7*   No results for input(s): LIPASE, AMYLASE in the last 168 hours. Recent Labs  Lab Dec 08, 2020 1208  AMMONIA 26   CBC: Recent Labs  Lab 11/16/20 2340 11/17/20 0804 11/17/20 1544 11/18/20 0840 11/18/20 2306 12-08-2020 0504 12/08/20 0543 2020/12/08 1759 12-08-20 2124  WBC 23.6* 23.8* 24.0* 24.5*  --   --  39.8*  --   --   NEUTROABS 21.4*  20.7*  --  21.7*  --   --   --   --   --   HGB 7.6* 7.0* 8.3* 8.8* 8.8* 9.5* 8.6* 8.8* 8.8*  HCT 23.1* 21.3* 25.7* 27.5* 26.0* 28.0* 28.1* 26.0* 26.0*  MCV 89.2 89.1 90.2 90.5  --   --  94.6  --   --   PLT 152 150 133* 142*  --   --  132*  --   --    Cardiac Enzymes: No results for input(s): CKTOTAL, CKMB, CKMBINDEX, TROPONINI in the last 168 hours. Sepsis Labs: Recent Labs  Lab 11/17/20 0804 11/17/20  [place] 11/18/20 0840 2020-11-23 0543 Nov 23, 2020 1208 2020/11/23 1330  PROCALCITON  --   --   --   --   --  3.52  WBC 23.8* 24.0* 24.5* 39.8*  --   --   LATICACIDVEN  --   --   --   --  7.7*  --      Candee Furbish 11/22/2020, 11:33 AM

## 2020-12-04 NOTE — Procedures (Signed)
Cortrak  Person Inserting Tube:  Sebastian Dzik, RD Tube Type:  Cortrak - 43 inches Tube Size:  10 Tube Location:  Left nare Initial Placement:  Postpyloric Secured by: Bridle Technique Used to Measure Tube Placement:  Marking at nare/corner of mouth Cortrak Secured At:  107 cm Procedure Comments:  Cortrak Tube Team Note:  Consult received to place a Cortrak feeding tube.   X-ray is required, abdominal x-ray has been ordered by the Cortrak team. Please confirm tube placement before using the Cortrak tube.   If the tube becomes dislodged please keep the tube and contact the Cortrak team at [email or web address] (password [email or web address]) for replacement.  If after hours and replacement cannot be delayed, place a NG tube and confirm placement with an abdominal x-ray.    Mariana Single MS, RD, LDN, CNSC Clinical Nutrition Pager listed in [place]

## 2020-12-04 NOTE — Progress Notes (Signed)
eLink Physician-Brief Progress Note Patient Name: [name] DOB: 04-13-1955 MRN: [id number]   Date of Service  December 18, 2020  HPI/Events of Note  1) abg from 759 noted 2) hypoglycemia noted 3) CT shows ileus  eICU Interventions  - inc'ed rr to 22 - ordered new bmp - started d10 at 30/hr - stopped feeds     Intervention Category Intermediate Interventions: Other:  [place] 12-18-20, 8:16 PM

## 2020-12-04 NOTE — Progress Notes (Signed)
500 mL Omnipaque oral solution administered via NG tube

## 2020-12-04 NOTE — Progress Notes (Signed)
[facility] NEPHROLOGY PROGRESS NOTE  Assessment/ Plan: Pt is a 65 y.o. yo female  with history of hypertension, HLD, type II DM, CHF, ESRD on HD MWF, h/o failed kidney transplant who presented to the [facility] with a left knee pain after a fall, seen as a consult for the management of ESRD.  She developed respiratory distress requiring intubation and transferred to [facility] ICU.  [facility]-  orders are as follows  MWF-  4 hours AVF-  16 gauge- 300 BFR 2/2.5 bath/300 rev/EDW 68.5-  no heparin Calcitriol 0.75 TIW, epo 12,000 TIW and venofer 50 weekly   #Comminuted distal left femur fracture after the fall: ortho on board, s/p left ORIF 9/12. With oozing still, platelet dysfunction 2/2 esrd? Will see if renal replacement therapy helps   # ESRD: MWF at [facility]:  -started CRRT 9/14 given persistent shock/pressor requirement -c/w CRRT. UF goal: net even 50-100cc/hr  #Vascular access: clotted AVF, IR has already been consulted for possible fgram and TDC placement once medically stable. Has a right fem temp line in the meantime   #Hyperkalemia: Improved after HD.  #Hyponatremia -will manage with renal replacement therapy, improved/resolved   #Hypotension/shock, intradialytic hypotension.  Required midodrine, albumin during HD.  Currently she is on Levophed   # Anemia of ESRD: Probably some blood loss.  Receiving blood transfusion.  No iron because of infection and on antibiotics.  Continue ESA.  # Metabolic Bone Disease: Monitor calcium phosphorus level.  Resume calcitriol, Renvela when able to take orally PO  Subjective: Seen and examined ICU and on CRRT. Extubated yesterday but then reintubated overnight. Still on levo. Net neg ~2.7L Objective Vital signs in last 24 hours: Vitals:   12-05-2020 0930 December 05, 2020 0945 Dec 05, 2020 1000 2020-12-05 1115  BP: (!) 77/49 (!) 109/48 (!) 81/57   Pulse: (!) 106     Resp: $Remo'16 18 13   'sevkP$ Temp:      TempSrc:      SpO2: 98%   98%  Weight:       Height:       Weight change: -3.8 kg  Intake/Output Summary (Last 24 hours) at 12/05/20 1142 Last data filed at December 05, 2020 1000 Gross per 24 hour  Intake 1568.05 ml  Output 3612 ml  Net -2043.95 ml       Labs: Basic Metabolic Panel: Recent Labs  Lab 11/18/20 0104 11/18/20 1849 11/18/20 2306 12-05-20 0504 12/05/2020 0543  NA 130* 133* 133* 135 135  K 4.6 4.1 4.3 4.3 4.4  CL 96* 99  --   --  96*  CO2 21* 23  --   --  22  GLUCOSE 220* 150*  --   --  109*  BUN 36* 14  --   --  12  CREATININE 5.18* 2.06*  --   --  1.85*  CALCIUM 7.2* 7.4*  --   --  8.2*  PHOS 6.3* 2.9  --   --  3.9   Liver Function Tests: Recent Labs  Lab 11/17/20 0804 11/17/20 1544 11/18/20 0104 11/18/20 1849 05-Dec-2020 0543  AST 21  --   --   --  181*  ALT 8  --   --   --  45*  ALKPHOS 60  --   --   --  94  BILITOT 1.2  --   --   --  1.6*  PROT 5.1*  --   --   --  5.4*  ALBUMIN 1.6*   < >  1.7* 1.7* 1.7*   < > = values in this interval not displayed.   No results for input(s): LIPASE, AMYLASE in the last 168 hours. No results for input(s): AMMONIA in the last 168 hours. CBC: Recent Labs  Lab 11/16/20 2340 11/17/20 0804 11/17/20 1544 11/18/20 0840 11/18/20 2306 12-03-20 0504 12-03-20 0543  WBC 23.6* 23.8* 24.0* 24.5*  --   --  39.8*  NEUTROABS 21.4* 20.7*  --  21.7*  --   --   --   HGB 7.6* 7.0* 8.3* 8.8* 8.8* 9.5* 8.6*  HCT 23.1* 21.3* 25.7* 27.5* 26.0* 28.0* 28.1*  MCV 89.2 89.1 90.2 90.5  --   --  94.6  PLT 152 150 133* 142*  --   --  132*   Cardiac Enzymes: No results for input(s): CKTOTAL, CKMB, CKMBINDEX, TROPONINI in the last 168 hours. CBG: Recent Labs  Lab 11/18/20 1613 11/18/20 1947 11/18/20 2334 2020-12-03 0329 2020/12/03 0751  GLUCAP 173* 151* 155* 94 114*    Iron Studies:  No results for input(s): IRON, TIBC, TRANSFERRIN, FERRITIN in the last 72 hours.  Studies/Results: DG Abd 1 View  Result Date: 11/18/2020 CLINICAL DATA:  Ileus, nausea/vomiting EXAM: ABDOMEN  - 1 VIEW COMPARISON:  11/13/2020 FINDINGS: There is moderate gaseous distention of the stomach. The nasogastric tube is no longer seen although the upper abdomen is excluded. Multiple gas distended small bowel loops are noted in the mid abdomen. The colon is nondistended. Extensive aortoiliac arterial calcifications. Right femoral hemodialysis catheter tip in the region of the right external/common iliac vein junction. Regional bones unremarkable. IMPRESSION: 1. Multiple distended small bowel loops suggesting adynamic ileus vs early/partial distal SBO. Electronically Signed   By: Lucrezia Europe M.D.   On: 11/18/2020 15:06   DG CHEST PORT 1 VIEW  Result Date: 2020/12/03 CLINICAL DATA:  Ventilator dependence. EXAM: PORTABLE CHEST 1 VIEW COMPARISON:  11/14/2020 FINDINGS: 0441 hours. Endotracheal tube tip is 1.7 cm above the base of the carina. NG tube tip overlies the gastric fundus. Persistent retrocardiac streaky opacity likely atelectasis. No pulmonary edema or substantial pleural effusion. Stable heart size. Bones are diffusely demineralized. Telemetry leads overlie the chest. IMPRESSION: 1. Endotracheal tube tip is 1.7 cm above the base of the carina. 2. Persistent retrocardiac streaky opacity, likely atelectasis. Electronically Signed   By: Misty Stanley M.D.   On: 12/03/20 05:03   DG Abd Portable 1V  Result Date: 11/18/2020 CLINICAL DATA:  NG tube placement EXAM: PORTABLE ABDOMEN - 1 VIEW COMPARISON:  11/18/2020 FINDINGS: Airspace disease at left base. Esophageal tube tip and side port overlie the body of the stomach. Dilated loops of small suggesting bowel obstruction IMPRESSION: Esophageal tube tip and side port overlie the body of stomach Electronically Signed   By: Donavan Foil M.D.   On: 11/18/2020 23:23    Medications: Infusions:   prismasol BGK 4/2.5 300 mL/hr at 11/18/20 2057   sodium chloride 5 mL/hr at 12/03/2020 1000   acetaminophen Stopped (December 03, 2020 0175)    ceFAZolin (ANCEF) IV      feeding supplement (VITAL 1.5 CAL)     heparin 1,200 Units/hr (12-03-20 1000)   norepinephrine (LEVOPHED) Adult infusion     prismasol BGK 2/2.5 replacement solution 500 mL/hr at 2020-12-03 0718   prismasol BGK 4/2.5 1,500 mL/hr at 12-03-2020 1108    Scheduled Medications:  amiodarone  100 mg Per Tube Daily   aspirin  81 mg Per Tube Daily   calcitRIOL  0.75 mcg Per Tube Q T,Th,Sat-1800  chlorhexidine gluconate (MEDLINE KIT)  15 mL Mouth Rinse BID   Chlorhexidine Gluconate Cloth  6 each Topical Q0600   darbepoetin (ARANESP) injection - DIALYSIS  100 mcg Subcutaneous Q Thu-HD   docusate  100 mg Per Tube BID   insulin aspart  0-6 Units Subcutaneous Q4H   mouth rinse  15 mL Mouth Rinse 10 times per day   metoCLOPramide (REGLAN) injection  5 mg Intravenous Q8H   midodrine  10 mg Per Tube Q8H   pantoprazole (PROTONIX) IV  40 mg Intravenous Q24H   polyethylene glycol  17 g Per Tube Daily   predniSONE  5 mg Per Tube Q breakfast   senna  1 tablet Per Tube BID   sertraline  100 mg Per Tube QHS   sodium chloride flush  10-40 mL Intracatheter Q12H   sodium chloride flush  3 mL Intravenous Q12H   Thrombi-Pad  2 each Topical Once   Vitamin D (Ergocalciferol)  50,000 Units Per Tube Q7 days    have reviewed scheduled and prn medications.  Physical Exam: General: Ill-looking female intubated Heart:RRR, s1s2 nl Lungs: cta bl Abdomen:soft,  non-distended Extremities:+ edema b/l le Neuro: sedated Dialysis Access: Right groin catheter in place, left upper extremity AV fistula has no bruit or thrill.  Linet Brash 2020-11-24,11:42 AM  LOS: 7 days

## 2020-12-04 NOTE — Progress Notes (Signed)
Spiritual Care Note  RN requested chaplain support on family's behalf as they prepared for compassionate extubation after a very difficult day. Spent time at the bedside before, during, and after [name]'s death, providing pastoral presence and empathic listening, particularly for her husband as he processed her decline. That she had named her tiredness and need for rest helps bring him some peace as he grieves.  Please page again if any other needs or questions arise. Thank you.   12/11/2020 2300  Clinical Encounter Type  Visited With Patient and family together  Visit Type Death  Referral From Nurse  Spiritual Encounters  Spiritual Needs Grief support;Emotional  Stress Factors  Family Stress Factors Loss   On-call Chaplain Shelva Majestic, [facility] 24/7 pager: [phone]

## 2020-12-04 NOTE — Progress Notes (Signed)
ANTICOAGULATION CONSULT NOTE - Follow Up Consult  Pharmacy Consult for heparin Indication: atrial fibrillation  Labs: Recent Labs    11/17/20 1544 11/17/20 2306 11/18/20 0104 11/18/20 0840 11/18/20 1810 11/18/20 1849 11/18/20 2306 2020/11/26 0504 11/26/2020 0543  HGB 8.3*  --   --  8.8*  --   --  8.8* 9.5* 8.6*  HCT 25.7*  --   --  27.5*  --   --  26.0* 28.0* 28.1*  PLT 133*  --   --  142*  --   --   --   --  132*  HEPARINUNFRC  --   --   --   --  0.12*  --   --   --  0.29*  CREATININE 7.33*  --  5.18*  --   --  2.06*  --   --  1.85*  TROPONINIHS  --  81* 77*  --   --   --   --   --   --     Assessment: 65yo female remains slightly subtherapeutic on heparin after rate change; no infusion issues or signs of bleeding per RN.  Goal of Therapy:  Heparin level 0.3-0.7 units/ml   Plan:  Will increase heparin infusion conservatively to 1200 units/hr and check level in 8 hours.   Wynona Neat, PharmD, BCPS  2020-11-26,6:54 AM

## 2020-12-04 NOTE — Progress Notes (Signed)
ANTICOAGULATION CONSULT NOTE - Follow Up Consult  Pharmacy Consult for IV heparin Indication: atrial fibrillation  Plan: Heparin stopped per MD due to neurostatus changes. Pharmacy will discontinue heparin consult; will continue to follow for other consults.  Zenaida Deed, PharmD PGY1 Acute Care Pharmacy Resident  Phone: [phone] 2020-12-11  3:27 PM

## 2020-12-04 NOTE — Progress Notes (Signed)
eLink Physician-Brief Progress Note Patient Name: [name] DOB: Feb 23, 1956 MRN: [id number]   Date of Service  11-28-2020  HPI/Events of Note  Pt's decision makers request removal from life support  eICU Interventions  - comfort care orders - extubate orders - dnr orders     Intervention Category Major Interventions: End of life / care limitation discussion  Tilden Dome 11/28/2020, 10:02 PM

## 2020-12-04 NOTE — Progress Notes (Addendum)
NAME:  [name], MRN:  [id number], DOB:  Jan 04, 1956, LOS: 7 ADMISSION DATE:  11/13/2020, CONSULTATION DATE: 11/13/2020 REFERRING MD: Heath Lark DO, CHIEF COMPLAINT:  L femur fracture  History of Present Illness:  65 year old with hypertension, hyperlipidemia, diabetes, afib, systolic and diastolic CHF, end-stage renal disease s/p failed transplant presenting with left distal femur fracture after a fall.  Ortho is planning for OR intervention on 9/12 at [facility]  Attempted hemodialysis on 9/9 but aborted due to clotting of AV fistula, unable to return 300 cc of blood. She then had a femoral catheter placed and dialyzed today 9/10 with 1 unit of blood due to blood loss anemia.  During dialysis she developed acute respiratory failure, possible aspiration and emergently intubated.    PCCM consulted for transfer to [facility] for further management  Pertinent  Medical History   Past Medical History:  Diagnosis Date   Abnormal CXR (chest x-ray) 05/25/2019   Abnormal LFTs (liver function tests) 03/11/2018   Acute hypoxemic respiratory failure ([place]) 12/16/2018   Acute on chronic combined systolic and diastolic CHF (congestive heart failure) -EF 40 to 45 % 08/10/2019   Acute respiratory failure with hypoxia ([place]) 12/02/2018   Anemia    of chronic disease   Blood transfusion without reported diagnosis    Cardiovascular disease    nonobstructive   Carotid artery stenosis 2008   CHF (congestive heart failure) ([place])    Coronary artery disease    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   Last Assessment & Plan:  - Continuing home suppression therapy: Bactrim M-W-F, Prednisone 5 mg daily, Myfortic 360 mg BID and Prograf 1 mg BID - Appreciate Nephrology recs   Diabetes mellitus    Dyslipidemia    Edema of lower extremity    with hypo-albuminemia and profound protenuria   HCAP (healthcare-associated pneumonia)    Heart murmur    Hypertension    Lung collapse 12/15/2018   Mitral  regurgitation    Nephrotic syndrome    Patent foramen ovale    Pulmonary edema 08/10/2019   Pulmonary hypertension, moderate to severe [facility])    Pulmonary nodule    Renal insufficiency    Tricuspid regurgitation    Volume depletion, renal, due to output loss (renal deficit)   Afib rvr 07/2020 on amiodarone  Significant Hospital Events: Including procedures, antibiotic start and stop dates in addition to other pertinent events   9/9 Admitted after a fall with femur fracture, HD catheter placed due to a clotted AV fistula 9/10 intubated for acute respiratory failure, aspiration, started on Zoysn (9/10-9/15) 9/11: Transferred to [facility]U 9/12: ORIF 9/15: failed extubation. Reintubated 9/16: Started on Ancef  Interim History / Subjective:  [name] had an eventful night. She was found to have illeus yesterday with NG tube inserted for decompression. She developed respiratory distress and ultimately required reintubation. She became hypothermic overnight requiring warming blanket and required increasing amounts of levophed overnight. This morning, she is intubated with NG tube to suction. No acute distress but no response to pain. Objective   Blood pressure (!) 86/51, pulse (!) 110, temperature (!) 97 F (36.1 C), temperature source Axillary, resp. rate 13, height '5\' 5"'$  (1.651 m), weight 69.1 kg, SpO2 100 %.    Vent Mode: PRVC FiO2 (%):  [40 %-100 %] 70 % Set Rate:  [14 bmp] 14 bmp Vt Set:  [450 mL] 450 mL PEEP:  [5 cmH20] 5 cmH20 Pressure Support:  [8 cmH20] 8 cmH20   Intake/Output Summary (  Last 24 hours) at 11/22/20 0753 Last data filed at 22-Nov-2020 0700 Gross per 24 hour  Intake 1572.7 ml  Output 4114 ml  Net -2541.3 ml   Filed Weights   11/16/20 0443 11/18/20 0204 2020-11-22 0500  Weight: 70.6 kg 72.9 kg 69.1 kg    Examination:  General:  critically ill appearing on mech vent, Eyes closed. Will slightly open them to voice.  HEENT: Pupils equal, MM pink/moist; ETT in place,  NG tube to suction draining brown fluid Neuro: on no sedation, will not follow commands, opens eyes slightly to voice, no response to pain CV: tachycardic, afib on monitor, no m/r/g PULM:  lung apices clear BS bilaterally; on mech vent PS at 70% FIO2 GI: soft, non distended, no evidence of tenderness Extremities: no edema  Resolved Hospital Problem list     Assessment & Plan:  Fall, left femur fracture -per ortho -dressing in place  Acute respiratory failure w/ hypoxia Possible aspiration pneumonia Shock WBC increased to 39.8 since 9/15. Culture with rare staph aures. Patient became hypothermic overnight with increasing levophed requirements. Reintubated on 9/15 after extubation earlier that day. Hypotension has not improved since admission. - continue mechanical ventilation. Not candidate for extubation given AMS - VAP prevention in place - wean fio2 for sats >90% - daily sbt/sat - Staph Aureus culture was pan sensitive - s/p zosyn for aspiration for 5 days (9/10 - 9/15) - Given increasing white count, will do continued course of ancef -trend CBC/fever curve  Acute Blood Loss Anemia- Resolved Shock S/p 4 units PRBC and 1L fluids. Unable to tolerate lower doses of levophed at this time. Hemoglobin stabilized.  - wean levophed to map > 65 - Continue midodrine  - transfuse for hgb <7 - infectious workup as above - monitor H and H  Acute encephalopathy secondary to intradialytic hypotension -limited improvement in mental status likely due to build up of metabolic causes and infection - Follow up Ammonia  ESRD: on HD MWF S/p failed renal transplant Hyperkalemia- Resolved with diaylsis Hyperphosphoremia -nephro following -Failed dialysis 9/9 due to clotted AV fistula -IR intervention when patient stable -Monitor BMP - Per nephro: resume calcitriol and renvela when able to tolerate PO - holding prograf for now, awaiting nephrology consultation - Currently on  CRRT  Ileus - Retrial of tube feeds today with slow increase of rate - No access for TPN at moment. If patient fails tube feeds, will need to consider additional access and starting TPN - restarted reglan - monitor abdominal exam  Chronic systolic and diastolic CHF: LVEF [id number] - caution with additional IV fluids - holding home metoprolol  Hx HTN and HLD -continue statin -holding home meds in setting of hypotension  Hx of afib Prolonged QTC- Resolved Patient remains in Afib and tachycardic despite home dose of amiodarone. QT interval improved. -continue amiodarone -holding eliquis until more stable -holding metoprolol in setting of hypotension - Avoid QT prolonging medications  T2DM w/ nephropathy and gastroparesis -SSI and CBG monitoring -continue reglan  Hx of generalized anxiety disorder P: -continue abilify and zoloft -will hold mirtazapine until extubated  Best Practice (right click and "Reselect all SmartList Selections" daily)   Diet/type: NPO w/ meds via tube DVT prophylaxis: SCD; Heparin GI prophylaxis: PPI Lines: Dialysis Catheter Foley:  N/A Code Status:  full code Last date of multidisciplinary goals of care discussion [pending]  Labs   CBC: Recent Labs  Lab 11/29/2020 1015 11/13/20 0548 11/16/20 2340 11/17/20 0804 11/17/20 1544 11/18/20 0840 11/18/20 2306  12/17/20 0504 2020-12-17 0543  WBC 19.2*   < > 23.6* 23.8* 24.0* 24.5*  --   --  39.8*  NEUTROABS 16.1*  --  21.4* 20.7*  --  21.7*  --   --   --   HGB 7.4*   < > 7.6* 7.0* 8.3* 8.8* 8.8* 9.5* 8.6*  HCT 25.0*   < > 23.1* 21.3* 25.7* 27.5* 26.0* 28.0* 28.1*  MCV 96.5   < > 89.2 89.1 90.2 90.5  --   --  94.6  PLT 227   < > 152 150 133* 142*  --   --  132*   < > = values in this interval not displayed.    Basic Metabolic Panel: Recent Labs  Lab 11/16/20 2340 11/17/20 0245 11/17/20 0804 11/17/20 1544 11/18/20 0104 11/18/20 1849 11/18/20 2306 12-17-20 0504 17-Dec-2020 0543  NA  --   --   125* 127* 130* 133* 133* 135 135  K  --   --  4.3 4.2 4.6 4.1 4.3 4.3 4.4  CL  --   --  88* 90* 96* 99  --   --  96*  CO2  --   --  18* 17* 21* 23  --   --  22  GLUCOSE  --   --  204* 214* 220* 150*  --   --  109*  BUN  --   --  58* 52* 36* 14  --   --  12  CREATININE  --   --  8.93* 7.33* 5.18* 2.06*  --   --  1.85*  CALCIUM  --   --  7.0* 6.6* 7.2* 7.4*  --   --  8.2*  MG 2.2 2.1 2.0  --  2.3  --   --   --  2.9*  PHOS 10.2* 9.8*  --  9.2* 6.3* 2.9  --   --  3.9   GFR: Estimated Creatinine Clearance: 29.2 mL/min (A) (by C-G formula based on SCr of 1.85 mg/dL (H)). Recent Labs  Lab 11/13/20 1330 11/14/20 0400 11/14/20 0510 11/14/20 0745 11/14/20 1413 11/14/20 2146 11/17/20 0804 11/17/20 1544 11/18/20 0840 17-Dec-2020 0543  PROCALCITON 3.92  --   --   --   --   --   --   --   --   --   WBC  --    < >  --    < >  --    < > 23.8* 24.0* 24.5* 39.8*  LATICACIDVEN  --   --  2.3*  --  1.7  --   --   --   --   --    < > = values in this interval not displayed.    Liver Function Tests: Recent Labs  Lab 11/28/2020 1015 11/14/20 0401 11/17/20 0804 11/17/20 1544 11/18/20 0104 11/18/20 1849 12-17-20 0543  AST 27  --  21  --   --   --  181*  ALT 34  --  8  --   --   --  45*  ALKPHOS 101  --  60  --   --   --  94  BILITOT 0.5  --  1.2  --   --   --  1.6*  PROT 6.6  --  5.1*  --   --   --  5.4*  ALBUMIN 2.7*   < > 1.6* 1.7* 1.7* 1.7* 1.7*   < > = values in this interval not displayed.  No results for input(s): LIPASE, AMYLASE in the last 168 hours. No results for input(s): AMMONIA in the last 168 hours.  ABG    Component Value Date/Time   PHART 7.315 (L) 12/17/20 0504   PCO2ART 45.9 12/17/2020 0504   PO2ART 60 (L) December 17, 2020 0504   HCO3 23.6 17-Dec-2020 0504   TCO2 25 12/17/2020 0504   ACIDBASEDEF 3.0 (H) 2020/12/17 0504   O2SAT 90.0 Dec 17, 2020 0504     Coagulation Profile: Recent Labs  Lab 11/23/2020 2243  INR 1.3*    Cardiac Enzymes: No results for input(s):  CKTOTAL, CKMB, CKMBINDEX, TROPONINI in the last 168 hours.  HbA1C: HB A1C (BAYER DCA - WAIVED)  Date/Time Value Ref Range Status  10/19/2020 09:41 AM 6.7 <7.0 % Final    Comment:                                          Diabetic Adult            <7.0                                       Healthy Adult        4.3 - 5.7                                                           (DCCT/NGSP) American Diabetes Association's Summary of Glycemic Recommendations for Adults with Diabetes: Hemoglobin A1c <7.0%. More stringent glycemic goals (A1c <6.0%) may further reduce complications at the cost of increased risk of hypoglycemia.   10/01/2018 11:48 AM 9.7 (H) <7.0 % Final    Comment:                                          Diabetic Adult            <7.0                                       Healthy Adult        4.3 - 5.7                                                           (DCCT/NGSP) American Diabetes Association's Summary of Glycemic Recommendations for Adults with Diabetes: Hemoglobin A1c <7.0%. More stringent glycemic goals (A1c <6.0%) may further reduce complications at the cost of increased risk of hypoglycemia.    Hgb A1c MFr Bld  Date/Time Value Ref Range Status  11/14/2020 10:15 AM 6.7 (H) 4.8 - 5.6 % Final    Comment:    (NOTE) Pre diabetes:          5.7%-6.4%  Diabetes:              >6.4%  Glycemic control for   <7.0% adults with diabetes   01/06/2020 04:34 PM 8.7 (H) 4.8 - 5.6 % Final    Comment:    (NOTE) Pre diabetes:          5.7%-6.4%  Diabetes:              >6.4%  Glycemic control for   <7.0% adults with diabetes     CBG: Recent Labs  Lab 11/18/20 1613 11/18/20 1947 11/18/20 2334 November 30, 2020 0329 2020-11-30 0751  GLUCAP 173* 151* 155* 94 114*      Critical care time:     Reece Agar, MS4

## 2020-12-04 NOTE — Progress Notes (Signed)
Patient transported to CT and back without complications. RN at bedside.  

## 2020-12-04 NOTE — Progress Notes (Signed)
Escalating pressor requirements. Lactate 7. Will scan belly but think if there's something intra-abdominal, she will be a poor operative candidate.  Will read out to [facility] if anything found otherwise continue supportive care; broaden abx.  Erskine Emery MD PCCM

## 2020-12-04 NOTE — Progress Notes (Signed)
Clarified with on call Medical Examiner, Leslie Andrea, about status on if patient would be considered an ME case. Leslie Andrea, ME stated pt would be a ME case. Per ME ok to continue with compassionate extubation for withdrawal of care. ME to be called back with TOD per request.

## 2020-12-04 NOTE — Progress Notes (Signed)
Nutrition Follow-up  DOCUMENTATION CODES:   Non-severe (moderate) malnutrition in context of chronic illness  INTERVENTION:   Initiate trickle tube feeds via post-pyloric Cortrak tube: - Vital 1.5 @ 20 ml/hr (480 ml/day)  Trickle tube feeding regimen provides 720 kcal, 32 grams of protein, and 367 ml of H2O (meets 36% of kcal needs and 25% of protein needs).  - Pt with inadequate nutrition since admission (7 days) and now with ileus. Pt is malnourished at baseline with wounds present. Recommend initiation of TPN alongside trickle tube feeds.   RD will monitor for ability to titrate tube feeds to goal: - Vital 1.5 @ 50 ml/hr (1200 ml/day) - ProSource TF 45 ml 5 x daily  Recommended goal tube feeding regimen would provide 2000 kcal, 136 grams of protein, and 914 ml of H2O.   - Once pt able to tolerate tube feeds, will order B-complex with vitamin C to account for losses with CRRT  NUTRITION DIAGNOSIS:   Moderate Malnutrition related to chronic illness (ESRD, CHF) as evidenced by mild fat depletion, moderate muscle depletion, percent weight loss (17.8% weight loss in less than 5 months).  Ongoing, being addressed via TF  GOAL:   Patient will meet greater than or equal to 90% of their needs  Met via TF at goal  MONITOR:   Vent status, Labs, Weight trends, TF tolerance, Skin, I & O's  REASON FOR ASSESSMENT:   Ventilator, Consult Enteral/tube feeding initiation and management  ASSESSMENT:   65 year old female who presented on 9/09 after a fall. PMH of ESRD on HD, CHF, T2DM, HTN, HLD, renal transplant. Pt found to have L femur fracture.  9/10 - rapid response for AMS, intubated during HD 9/12 - s/p ORIF of L distal femur fracture 9/14 - CRRT initiated 9/15 - extubated, failed swallow evaluation, vomiting, NGT placed 9/16 - intubated  Discussed pt with RN and during ICU rounds. Pt remains on CRRT. NG tube remains in place. Post-pyloric Cortrak tube placed this morning  by Cortrak team.  Discussed recommendation for TPN with CCM. Per CCM, plan to retry tube feeds first via post-pyloric Cortrak tube and keep NGT to suction. MD has reordered reglan. RD concerned for tolerance given multiple episodes of vomiting yesterday and abdominal x-rays showing ileus vs SBO. At this point, recommend intiation of TPN alongside trickle tube feeds.  Admit weight: 67.9 kg Current weight: 69.1 kg EDW: 68.5 kg  Pt now with +3 pitting pitting edema to BUE and +2 to +3 pitting edema to BLE.  Patient is currently intubated on ventilator support MV: 7.0 L/min Temp (24hrs), Avg:94.8 F (34.9 C), Min:92.8 F (33.8 C), Max:97 F (36.1 C) BP (cuff): 81/59 MAP (cuff): 68  Drips: Levophed Heparin  Medications reviewed and include: calcitriol with HD, aranesp, colace, SSI q 4 hours, IV reglan 5 mg q 8 hours, rena-vit, IV protonix, miralax, prednisone, senna, renvela TID, vitamin D 50,000 units weekly, IV magnesium sulfate 2 grams once, heparin drip, levophed drip, IV abx, IV acetaminophen  Labs reviewed: ionized calcium 1.11, magnesium 2.9, elevated LFTs, hemoglobin 8.8 CBG's: 94-173 x 24 hours  NGT output: 1050 ml x 12 hours CRRT UF: 3064 ml x 24 hours I/O's: +3.3 L since admit  Diet Order:   Diet Order             Diet NPO time specified  Diet effective now                   EDUCATION NEEDS:  Not appropriate for education at this time  Skin:  Skin Assessment: Skin Integrity Issues: Stage II: sacrum Incisions: LLE x 2 Other: skin tear to R arm and L wrist  Last BM:  12-14-20 small type 7 via rectal tube  Height:   Ht Readings from Last 1 Encounters:  11/13/20 $RemoveB'5\' 5"'ljvWPGHz$  (1.651 m)    Weight:   Wt Readings from Last 1 Encounters:  2020/12/14 69.1 kg    BMI:  Body mass index is 25.35 kg/m.  Estimated Nutritional Needs:   Kcal:  2000-2200  Protein:  130-150 grams  Fluid:  2.0 L/day    Gustavus Bryant, MS, RD, LDN Inpatient Clinical  Dietitian Please see [facility] for contact information.

## 2020-12-04 NOTE — Plan of Care (Signed)
  Interdisciplinary Goals of Care Family Meeting   Date carried out:: 12/16/2020  Location of the meeting: Unit  Member's involved: Bedside Registered Nurse, Family Member or next of kin, and Other: Physician Assistant   Durable Power of Attorney or acting medical decision maker:   [name]  Discussion: We discussed goals of care for [employer]    Family meeting held with Pt's husband and over 53 family members.   We discussed her worsening shock, mental status and acidosis and that despite maximal supportive care it appears that her overall clinical course is worsening.  Her family feels that she would want maximal medical therapy, but understand that comfort care is an option and seem open to this if she continues to decline.  They are currently discussing full code vs DNR as a family, for now she remains full code  Code status: Full Code  Disposition: Continue current acute care  Time spent for the meeting: 35 minutes  Otilio Carpen Railey Glad 2020-12-16, 7:01 PM

## 2020-12-04 NOTE — Progress Notes (Signed)
Pharmacy Antibiotic Note  [name] is a 65 y.o. female admitted on 11/24/2020 with sepsis.  Pharmacy has been consulted for meropenem and vancomycin dosing.  Patient was originally receiving cefazolin; therapy is being broadened given worsening clinical status. She is currently on CRRT; antibiotics have been dosed accordingly.  Plan: Discontinue cefazolin Meropenem 1 g IV every 8 hours Vancomycin 1250 mg IV x1 followed by vancomycin 750 mg IV every 24 hours Monitor clinical status, cultures, renal function Check vancomycin levels as needed  Height: '5\' 5"'$  (165.1 cm) Weight: 69.1 kg (152 lb 5.4 oz) IBW/kg (Calculated) : 57  Temp (24hrs), Avg:94.8 F (34.9 C), Min:92.8 F (33.8 C), Max:97 F (36.1 C)  Recent Labs  Lab 11/14/20 0510 11/14/20 0745 11/14/20 1413 11/14/20 2146 11/16/20 2340 11/17/20 0804 11/17/20 1544 11/18/20 0104 11/18/20 0840 11/18/20 1849 2020/11/22 0543 22-Nov-2020 1208  WBC  --    < >  --    < > 23.6* 23.8* 24.0*  --  24.5*  --  39.8*  --   CREATININE  --    < >  --    < >  --  8.93* 7.33* 5.18*  --  2.06* 1.85*  --   LATICACIDVEN 2.3*  --  1.7  --   --   --   --   --   --   --   --  7.7*   < > = values in this interval not displayed.    Estimated Creatinine Clearance: 29.2 mL/min (A) (by C-G formula based on SCr of 1.85 mg/dL (H)).    Allergies  Allergen Reactions   Other Other (See Comments)    Tape Bruises and tears skin, Paper tape tolerated   Tape Other (See Comments)    Bruises and tears skin Paper tape tolerated Bruises and tears skin Paper tape tolerated Bruises and tears skin Paper tape tolerated   Renvela [Sevelamer] Nausea And Vomiting    Inability to eat or sleep per spouse. States that all side effects listed for this medication, the patient did experience    Antimicrobials this admission: Zosyn 9/10>>9/16 Ancef 9/16 >>9/16 Merrem 9/16 >> Vanc 9/16>>   Microbiology results: 9/11 BCx: rare staph aureus, rare candida  dubliniensis 9/11 MRSA PCR: neg  Thank you for allowing pharmacy to be a part of this patient's care.  Zenaida Deed, PharmD PGY1 Acute Care Pharmacy Resident  Phone: [phone] 2020-11-22  3:41 PM  Please check [email or web address] for unit-specific pharmacy phone numbers.

## 2020-12-04 DEATH — deceased

## 2021-01-20 ENCOUNTER — Ambulatory Visit: Payer: Medicare HMO | Admitting: Family Medicine
